# Patient Record
Sex: Female | Born: 1945
Health system: Southern US, Community
[De-identification: ages and names within clinical notes are randomized; demographics above are authoritative.]

## PROBLEM LIST (undated history)

## (undated) ENCOUNTER — Emergency Department (HOSPITAL_BASED_OUTPATIENT_CLINIC_OR_DEPARTMENT_OTHER): Admission: EM | Payer: Medicare Other

## (undated) ENCOUNTER — Emergency Department (HOSPITAL_COMMUNITY)
Disposition: A | Discharge status: Home / Self Care | Payer: Self-pay | Attending: Family Medicine | Admitting: Family Medicine

## (undated) DIAGNOSIS — Z87442 Personal history of urinary calculi: Secondary | ICD-10-CM

## (undated) DIAGNOSIS — E669 Obesity, unspecified: Secondary | ICD-10-CM

## (undated) DIAGNOSIS — D649 Anemia, unspecified: Secondary | ICD-10-CM

## (undated) DIAGNOSIS — L719 Rosacea, unspecified: Secondary | ICD-10-CM

## (undated) DIAGNOSIS — J439 Emphysema, unspecified: Secondary | ICD-10-CM

## (undated) DIAGNOSIS — K219 Gastro-esophageal reflux disease without esophagitis: Secondary | ICD-10-CM

## (undated) DIAGNOSIS — E785 Hyperlipidemia, unspecified: Secondary | ICD-10-CM

## (undated) DIAGNOSIS — F32A Depression, unspecified: Secondary | ICD-10-CM

## (undated) DIAGNOSIS — I509 Heart failure, unspecified: Secondary | ICD-10-CM

## (undated) DIAGNOSIS — E119 Type 2 diabetes mellitus without complications: Secondary | ICD-10-CM

## (undated) DIAGNOSIS — J45909 Unspecified asthma, uncomplicated: Secondary | ICD-10-CM

## (undated) DIAGNOSIS — E039 Hypothyroidism, unspecified: Secondary | ICD-10-CM

## (undated) DIAGNOSIS — R7303 Prediabetes: Secondary | ICD-10-CM

## (undated) DIAGNOSIS — G4730 Sleep apnea, unspecified: Secondary | ICD-10-CM

## (undated) DIAGNOSIS — I10 Essential (primary) hypertension: Secondary | ICD-10-CM

## (undated) DIAGNOSIS — F329 Major depressive disorder, single episode, unspecified: Secondary | ICD-10-CM

## (undated) DIAGNOSIS — K649 Unspecified hemorrhoids: Secondary | ICD-10-CM

## (undated) DIAGNOSIS — J8410 Pulmonary fibrosis, unspecified: Secondary | ICD-10-CM

## (undated) HISTORY — DX: Major depressive disorder, single episode, unspecified: F32.9

## (undated) HISTORY — DX: Depression, unspecified: F32.A

## (undated) HISTORY — DX: Obesity, unspecified: E66.9

## (undated) HISTORY — DX: Emphysema, unspecified: J43.9

## (undated) HISTORY — PX: ABDOMINAL HYSTERECTOMY: SHX81

## (undated) HISTORY — DX: Hyperlipidemia, unspecified: E78.5

## (undated) HISTORY — DX: Unspecified hemorrhoids: K64.9

## (undated) HISTORY — DX: Rosacea, unspecified: L71.9

## (undated) HISTORY — PX: NASAL SINUS SURGERY: SHX719

## (undated) HISTORY — PX: COLONOSCOPY: SHX174

## (undated) HISTORY — DX: Hypothyroidism, unspecified: E03.9

## (undated) HISTORY — PX: EYE SURGERY: SHX253

## (undated) HISTORY — PX: CYSTOSCOPY: SUR368

## (undated) HISTORY — PX: VAGINECTOMY, PARTIAL: SHX6846

---

## 1999-12-05 ENCOUNTER — Encounter: Payer: Self-pay | Admitting: Emergency Medicine

## 1999-12-05 ENCOUNTER — Emergency Department (HOSPITAL_COMMUNITY): Admission: EM | Admit: 1999-12-05 | Discharge: 1999-12-05 | Payer: Self-pay | Admitting: Emergency Medicine

## 2000-09-15 ENCOUNTER — Encounter: Payer: Self-pay | Admitting: Internal Medicine

## 2000-09-15 ENCOUNTER — Encounter: Admission: RE | Admit: 2000-09-15 | Discharge: 2000-09-15 | Payer: Self-pay | Admitting: Internal Medicine

## 2001-07-24 ENCOUNTER — Encounter: Payer: Self-pay | Admitting: Internal Medicine

## 2001-11-30 ENCOUNTER — Encounter: Admission: RE | Admit: 2001-11-30 | Discharge: 2001-11-30 | Payer: Self-pay | Admitting: Internal Medicine

## 2001-12-27 ENCOUNTER — Encounter: Payer: Self-pay | Admitting: Internal Medicine

## 2004-10-13 ENCOUNTER — Ambulatory Visit: Payer: Self-pay | Admitting: Internal Medicine

## 2004-10-29 ENCOUNTER — Ambulatory Visit: Payer: Self-pay | Admitting: Internal Medicine

## 2004-11-12 ENCOUNTER — Ambulatory Visit: Payer: Self-pay | Admitting: Internal Medicine

## 2006-05-08 ENCOUNTER — Ambulatory Visit: Payer: Self-pay | Admitting: Internal Medicine

## 2006-05-09 ENCOUNTER — Encounter: Payer: Self-pay | Admitting: Internal Medicine

## 2006-05-29 ENCOUNTER — Ambulatory Visit: Payer: Self-pay | Admitting: Internal Medicine

## 2006-05-29 LAB — CONVERTED CEMR LAB
BUN: 10 mg/dL (ref 6–23)
Basophils Absolute: 0.1 10*3/uL (ref 0.0–0.1)
Basophils Relative: 0.7 % (ref 0.0–1.0)
CO2: 29 meq/L (ref 19–32)
Calcium: 9.3 mg/dL (ref 8.4–10.5)
Chloride: 101 meq/L (ref 96–112)
Cholesterol: 211 mg/dL (ref 0–200)
Creatinine, Ser: 0.7 mg/dL (ref 0.4–1.2)
Direct LDL: 146.7 mg/dL
Eosinophils Absolute: 0.3 10*3/uL (ref 0.0–0.6)
Eosinophils Relative: 3.2 % (ref 0.0–5.0)
GFR calc Af Amer: 109 mL/min
GFR calc non Af Amer: 90 mL/min
Glucose, Bld: 117 mg/dL — ABNORMAL HIGH (ref 70–99)
HCT: 42.3 % (ref 36.0–46.0)
HDL: 53.2 mg/dL (ref >39.0)
Hemoglobin: 14.4 g/dL (ref 12.0–15.0)
Hgb A1c MFr Bld: 6 % (ref 4.6–6.0)
Lymphocytes Relative: 34.9 % (ref 12.0–46.0)
MCHC: 34 g/dL (ref 30.0–36.0)
MCV: 90.9 fL (ref 78.0–100.0)
Monocytes Absolute: 0.5 10*3/uL (ref 0.2–0.7)
Monocytes Relative: 6.2 % (ref 3.0–11.0)
Neutro Abs: 4.7 10*3/uL (ref 1.4–7.7)
Neutrophils Relative %: 55 % (ref 43.0–77.0)
Platelets: 310 10*3/uL (ref 150–400)
Potassium: 3.4 meq/L — ABNORMAL LOW (ref 3.5–5.1)
RBC: 4.65 M/uL (ref 3.87–5.11)
RDW: 11.4 % — ABNORMAL LOW (ref 11.5–14.6)
Sodium: 138 meq/L (ref 135–145)
TSH: 0.37 microintl units/mL (ref 0.35–5.50)
Total CHOL/HDL Ratio: 4
Triglycerides: 111 mg/dL (ref 0–149)
VLDL: 22 mg/dL (ref 0–40)
WBC: 8.6 10*3/uL (ref 4.5–10.5)

## 2006-06-29 ENCOUNTER — Encounter: Admission: RE | Admit: 2006-06-29 | Discharge: 2006-06-29 | Payer: Self-pay | Admitting: Otolaryngology

## 2006-08-08 ENCOUNTER — Ambulatory Visit (HOSPITAL_BASED_OUTPATIENT_CLINIC_OR_DEPARTMENT_OTHER): Admission: RE | Admit: 2006-08-08 | Discharge: 2006-08-08 | Payer: Self-pay | Admitting: Otolaryngology

## 2006-11-06 ENCOUNTER — Encounter: Payer: Self-pay | Admitting: Internal Medicine

## 2006-12-12 ENCOUNTER — Encounter: Payer: Self-pay | Admitting: Internal Medicine

## 2007-04-11 ENCOUNTER — Ambulatory Visit: Payer: Self-pay | Admitting: Internal Medicine

## 2007-04-11 DIAGNOSIS — R32 Unspecified urinary incontinence: Secondary | ICD-10-CM | POA: Insufficient documentation

## 2007-04-11 DIAGNOSIS — L719 Rosacea, unspecified: Secondary | ICD-10-CM | POA: Insufficient documentation

## 2007-04-11 DIAGNOSIS — E1159 Type 2 diabetes mellitus with other circulatory complications: Secondary | ICD-10-CM | POA: Insufficient documentation

## 2007-04-11 DIAGNOSIS — I10 Essential (primary) hypertension: Secondary | ICD-10-CM | POA: Insufficient documentation

## 2007-04-11 DIAGNOSIS — E782 Mixed hyperlipidemia: Secondary | ICD-10-CM | POA: Insufficient documentation

## 2007-04-11 DIAGNOSIS — R5383 Other fatigue: Secondary | ICD-10-CM

## 2007-04-11 DIAGNOSIS — Z87442 Personal history of urinary calculi: Secondary | ICD-10-CM | POA: Insufficient documentation

## 2007-04-11 DIAGNOSIS — R5381 Other malaise: Secondary | ICD-10-CM | POA: Insufficient documentation

## 2007-04-11 DIAGNOSIS — E039 Hypothyroidism, unspecified: Secondary | ICD-10-CM | POA: Insufficient documentation

## 2007-04-11 LAB — CONVERTED CEMR LAB: Vit D, 1,25-Dihydroxy: 27 — ABNORMAL LOW (ref 30–89)

## 2007-04-16 LAB — CONVERTED CEMR LAB
BUN: 11 mg/dL (ref 6–23)
Basophils Absolute: 0 10*3/uL (ref 0.0–0.1)
Basophils Relative: 0.4 % (ref 0.0–1.0)
CO2: 31 meq/L (ref 19–32)
Calcium: 9.6 mg/dL (ref 8.4–10.5)
Chloride: 103 meq/L (ref 96–112)
Creatinine, Ser: 0.7 mg/dL (ref 0.4–1.2)
Eosinophils Absolute: 0.2 10*3/uL (ref 0.0–0.6)
Eosinophils Relative: 2.7 % (ref 0.0–5.0)
GFR calc Af Amer: 109 mL/min
GFR calc non Af Amer: 90 mL/min
Glucose, Bld: 92 mg/dL (ref 70–99)
HCT: 43.2 % (ref 36.0–46.0)
Hemoglobin: 14.8 g/dL (ref 12.0–15.0)
Lymphocytes Relative: 32.8 % (ref 12.0–46.0)
MCHC: 34.4 g/dL (ref 30.0–36.0)
MCV: 91.4 fL (ref 78.0–100.0)
Monocytes Absolute: 0.7 10*3/uL (ref 0.2–0.7)
Monocytes Relative: 7.7 % (ref 3.0–11.0)
Neutro Abs: 5.1 10*3/uL (ref 1.4–7.7)
Neutrophils Relative %: 56.4 % (ref 43.0–77.0)
Platelets: 295 10*3/uL (ref 150–400)
Potassium: 3.7 meq/L (ref 3.5–5.1)
RBC: 4.72 M/uL (ref 3.87–5.11)
RDW: 11.6 % (ref 11.5–14.6)
Sodium: 142 meq/L (ref 135–145)
TSH: 8.51 microintl units/mL — ABNORMAL HIGH (ref 0.35–5.50)
WBC: 9 10*3/uL (ref 4.5–10.5)

## 2007-04-20 ENCOUNTER — Telehealth: Payer: Self-pay | Admitting: Internal Medicine

## 2007-06-06 ENCOUNTER — Ambulatory Visit: Payer: Self-pay | Admitting: Internal Medicine

## 2007-06-06 LAB — CONVERTED CEMR LAB: TSH: 1.21 microintl units/mL (ref 0.35–5.50)

## 2007-08-08 ENCOUNTER — Encounter: Payer: Self-pay | Admitting: Internal Medicine

## 2007-08-08 LAB — CONVERTED CEMR LAB: Vit D, 1,25-Dihydroxy: 25 — ABNORMAL LOW (ref 30–89)

## 2007-08-17 ENCOUNTER — Telehealth: Payer: Self-pay | Admitting: *Deleted

## 2007-08-17 ENCOUNTER — Encounter: Payer: Self-pay | Admitting: Internal Medicine

## 2007-09-06 ENCOUNTER — Encounter: Payer: Self-pay | Admitting: Internal Medicine

## 2007-09-25 ENCOUNTER — Ambulatory Visit: Payer: Self-pay | Admitting: Internal Medicine

## 2007-09-25 DIAGNOSIS — J069 Acute upper respiratory infection, unspecified: Secondary | ICD-10-CM | POA: Insufficient documentation

## 2007-09-28 ENCOUNTER — Telehealth: Payer: Self-pay | Admitting: *Deleted

## 2007-10-01 ENCOUNTER — Telehealth: Payer: Self-pay | Admitting: Internal Medicine

## 2007-10-02 ENCOUNTER — Encounter: Admission: RE | Admit: 2007-10-02 | Discharge: 2007-10-02 | Payer: Self-pay | Admitting: Family Medicine

## 2007-10-19 ENCOUNTER — Telehealth: Payer: Self-pay | Admitting: *Deleted

## 2007-12-28 ENCOUNTER — Encounter: Payer: Self-pay | Admitting: Internal Medicine

## 2008-02-21 ENCOUNTER — Telehealth: Payer: Self-pay | Admitting: *Deleted

## 2008-08-06 ENCOUNTER — Encounter: Admission: RE | Admit: 2008-08-06 | Discharge: 2008-08-06 | Payer: Self-pay | Admitting: Family Medicine

## 2009-12-18 DIAGNOSIS — J309 Allergic rhinitis, unspecified: Secondary | ICD-10-CM | POA: Insufficient documentation

## 2010-03-26 ENCOUNTER — Encounter
Admission: RE | Admit: 2010-03-26 | Discharge: 2010-03-26 | Payer: Self-pay | Source: Home / Self Care | Attending: Family Medicine | Admitting: Family Medicine

## 2010-04-03 ENCOUNTER — Encounter: Payer: Self-pay | Admitting: Family Medicine

## 2010-04-13 NOTE — Letter (Signed)
Summary: Generic Letter  [facility]   [place], [place] [address]   Phone: [phone]  Fax: [phone]    08/08/2007  [place], [place]  [address]  Dear Ms. Depaola,  We have tried to call you about your lab results. Your thyroid is normal but your Vitamin D is still low. Dr. Regis Bill wants to know if you are taking weekly Vitamin D? Please call our office at [phone] and let us know what your doing.         Sincerely,   Larene Beach, CMA [facility]

## 2010-04-13 NOTE — Assessment & Plan Note (Signed)
Summary: fu on meds/njr   Vital Signs:  Patient Profile:   65 Years Old Female Weight:      208 pounds Pulse rate:   84 / minute BP sitting:   156 / 90  (right arm) Cuff size:   regular  Vitals Entered By: Sherron Monday, CMA (April 11, 2007 10:53 AM)                 Chief Complaint:  Follow up.  History of Present Illness: Susan Hunt is here for a follow up on medications. Pt needs refills on all medications. Pt wants the generic effexor. She also wants to get mephloquin for traveling out of the country. She also needs to discuss going on oxybutynin.  Last ov was 2.08 for an acute visit and she was unable to follow up because of insurance and financial issues. But she has been doing fairly well. Going to [employer] a mission trip  in April for 3 weeks.  Wants the mefloquin for prophylaxis if not CI   HT : Has been in the high range 150-160    NO CP or sob. Tried 7.5 norvasc and can get down to 120 range. without symptom prefers least meds and generic.  Thyroid  : on generic.  needs labs done  has fatigue and dry skin fro a while takes med in pm on emmpty stomach.  MOOD :  Effexor xr  really only taking 1-2 and mood seems ok and stable  Lpiids : lipid panel done at hospital screening    TC 198 ? rest was good  BG  ? low   no problems GU  :  IFrequency Used Detrol in the past.  with help.  wants to try generic  oxybutinin  as it may have helped in the past.   Sleep ok no OSA or RLS  Hypertension History:      She denies headache, chest pain, palpitations, dyspnea with exertion, orthopnea, PND, peripheral edema, visual symptoms, neurologic problems, syncope, and side effects from treatment.  She notes no problems with any antihypertensive medication side effects.        Positive major cardiovascular risk factors include female age 58 years old or older, hyperlipidemia, and hypertension.  Negative major cardiovascular risk factors include no history of diabetes and  non-tobacco-user status.        Further assessment for target organ damage reveals no history of ASHD, cardiac end-organ damage (CHF/LVH), stroke/TIA, or peripheral vascular disease.     Current Allergies (reviewed today): ! CODEINE ! BIAXIN ! MICARDIS (TELMISARTAN)  Past Medical History:    Hyperlipidemia    Hypertension    Hypothyroidism        echo 7 03 mild pulmonary HT see pulmonary consult  stable   Family History:    CAD  deceased 64 Mom     Family History of Alcoholism/Addiction father.          Social History:    Reviewed history and no changes required:       Married       Former Smoker       Rare etoh,       No caffiene.        No exercise.    Risk Factors:  Tobacco use:  quit    Year quit:  8 years ago   Review of Systems  The patient denies anorexia, fever, weight loss, chest pain, syncope, dyspnea on exhertion, peripheral edema, abdominal pain, melena, and  hematochezia.         derm  dry skin and itchy  see hpi . no periods or hot flushes   Physical Exam  General:     alert, well-developed, well-nourished, and well-hydrated.   Head:     normocephalic and atraumatic.   Eyes:     vision grossly intact and pupils equal.   Ears:     R ear normal and L ear normal.   Nose:     no external deformity and no nasal discharge.   Mouth:     pharynx pink and moist and no erythema.   Neck:     No deformities, masses, or tenderness noted.no thyromegaly.   Lungs:     Normal respiratory effort, chest expands symmetrically. Lungs are clear to auscultation, no crackles or wheezes. Heart:     Normal rate and regular rhythm. S1 and S2 normal without gallop, murmur, click, rub or other extra sounds. Abdomen:     Bowel sounds positive,abdomen soft and non-tender without masses, organomegaly or hernias noted. Pulses:     intact without bruits Extremities:     no CCE Neurologic:     grossly intact without deficit Skin:     turgor normal.  mild facial  erythema. Dry skin with some excoraition at bra line area Cervical Nodes:     no anterior cervical adenopathy and no posterior cervical adenopathy.   Psych:     normally interactive, good eye contact, not anxious appearing, and not depressed appearing.      Impression & Recommendations:  Problem # 1:  HYPERTENSION (ICD-401.9) needs better control and need to inc med   pt thinks increasig norvasc will do this  can inc to 61m/day if needed Her updated medication list for this problem includes:    Hydrochlorothiazide 25 Mg Tabs (Hydrochlorothiazide) ..Marland Kitchen.. 1 by mouth once daily    Norvasc 5 Mg Tabs (Amlodipine besylate) ..Marland Kitchen.. 1.5 by mouth once daily  Orders: Venipuncture ((16109 TLB-BMP (Basic Metabolic Panel-BMET) (860454-[id number]  BP today: 156/90  Labs Reviewed: Creat: 0.7 (05/29/2006) Chol: 211 (05/29/2006)   HDL: 53.2 (05/29/2006)   LDL: DEL (05/29/2006)   TG: 111 (05/29/2006)   Problem # 2:  FATIGUE (ICD-780.79) r/o anemia and thyroid cause Orders: Venipuncture ((47829 TLB-CBC Platelet - w/Differential (85025-CBCD)   Problem # 3:  URINARY INCONTINENCE (ICD-788.30) ok to try generic  ditropan   Problem # 4:  HYPOTHYROIDISM (ICD-244.9)  Her updated medication list for this problem includes:    Synthroid 125 Mcg Tabs (Levothyroxine sodium) ..Marland Kitchen.. 1 by mouth once daily  Orders: Venipuncture ((56213 TLB-TSH (Thyroid Stimulating Hormone) (84443-TSH)  Labs Reviewed: TSH: 0.37 (05/29/2006)    HgBA1c: 6.0 (05/29/2006) Chol: 211 (05/29/2006)   HDL: 53.2 (05/29/2006)   LDL: DEL (05/29/2006)   TG: 111 (05/29/2006)   Problem # 5:  Preventive Health Care (ICD-V70.0) travel disc and rx for malaria prophylaxis with caution   to get yellow fever immuniz at hd  Problem # 6:  DYSTHYMIA, CHRONIC (ICD-300.4) stable  rec against IR effexor because of risk of side effect .  but could try samples of cymbalta 60 28 given and can call for rx .   cost may be less  Problem # 7:   HYPERLIPIDEMIA (ICD-272.4) reported better by patient   she will get uKoreacopy or call in numbers of lipid screen. Labs Reviewed: Chol: 211 (05/29/2006)   HDL: 53.2 (05/29/2006)   LDL: DEL (05/29/2006)   TG: 111 (05/29/2006)   Complete Medication  List: 1)  Synthroid 125 Mcg Tabs (Levothyroxine sodium) .Marland Kitchen.. 1 by mouth once daily 2)  Baby Aspirin 81 Mg Chew (Aspirin) 3)  Hydrochlorothiazide 25 Mg Tabs (Hydrochlorothiazide) .Marland Kitchen.. 1 by mouth once daily 4)  Norvasc 5 Mg Tabs (Amlodipine besylate) .... 1.5 by mouth once daily 5)  Oxybutynin Chloride 5 Mg Tabs (Oxybutynin chloride) .Marland Kitchen.. 1 by mouth three times a day prn 6)  Lariam 250 Mg Tabs (Mefloquine hcl) .Marland Kitchen.. 1 by mouth q week beginning 1 week pre travel and continue 2 weeks after return 7)  Cymbalta 60 Mg Cpep (Duloxetine hcl) .Marland Kitchen.. 1 by mouth once daily  Other Orders: T-Vitamin D (25-Hydroxy) [phone])  Hypertension Assessment/Plan:      The patient's hypertensive risk group is category B: At least one risk factor (excluding diabetes) with no target organ damage.  Today's blood pressure is 156/90.  Her blood pressure goal is < 140/90.   Patient Instructions: 1)  check BP readings and call March 1 st with readings on new dose of Norvasc.  2)  We will call you about lab results.    Prescriptions: HYDROCHLOROTHIAZIDE 25 MG  TABS (HYDROCHLOROTHIAZIDE) 1 by mouth once daily  #90 x 3   Entered and Authorized by:   Burnis Medin MD   Signed by:   Burnis Medin MD on 04/11/2007   Method used:   Electronically sent to ...       [facility]       [address]       [place], [place]  [address]       Ph: [phone]       Fax: [phone]   RxID:   [id number] SYNTHROID 125 MCG  TABS (LEVOTHYROXINE SODIUM) 1 by mouth once daily  #30 x 12   Entered and Authorized by:   Burnis Medin MD   Signed by:   Burnis Medin MD on 04/11/2007   Method used:   Electronically sent to ...        [facility]       [address]       [place], [place]  [address]       Ph: [phone]       Fax: [phone]   RxID:   [id number] NORVASC 5 MG  TABS (AMLODIPINE BESYLATE) 1.5 by mouth once daily  #45 x 15   Entered and Authorized by:   Burnis Medin MD   Signed by:   Burnis Medin MD on 04/11/2007   Method used:   Electronically sent to ...       [facility]       [address]       [place], [place]  [address]       Ph: [phone]       Fax: [phone]   RxID:   [id number] LARIAM 250 MG  TABS (MEFLOQUINE HCL) 1 by mouth q week beginning 1 week pre travel and continue 2 weeks after return  #6 x 0   Entered and Authorized by:   Burnis Medin MD   Signed by:   Burnis Medin MD on 04/11/2007   Method used:   Electronically sent to ...       [facility].*  [address]       [place], [place]  [address]       Ph: [phone]       Fax: [phone]   RxID:   [id number] OXYBUTYNIN CHLORIDE 5 MG  TABS (OXYBUTYNIN CHLORIDE) 1 by mouth three times a day prn  #90 x 6   Entered and Authorized by:   Burnis Medin MD   Signed by:   Burnis Medin MD on 04/11/2007   Method used:   Electronically sent to ...       [facility]       [address]       [place], [place]  [address]       Ph: [phone]       Fax: [phone]   RxID:   [id number]  ]

## 2010-04-13 NOTE — Medication Information (Signed)
Summary: cigna letter re: medication  cigna letter re: medication   Imported By: Jamelle Haring 11/14/2006 14:20:35  _____________________________________________________________________  External Attachment:    Type:   Image     Comment:   cigna note

## 2010-04-13 NOTE — Assessment & Plan Note (Signed)
Summary: ?flu symptoms/njr   Vital Signs:  Patient Profile:   65 Years Old Female O2 Sat:      96 % O2 treatment:    Room Air Temp:     98.9 degrees F oral Pulse rate:   74 / minute Pulse rhythm:   regular BP sitting:   120 / 90  (right arm) Cuff size:   regular  Vitals Entered By: Sherron Monday, CMA (September 25, 2007 8:52 AM)                 Chief Complaint:  Cold & URI symptoms.  History of Present Illness: Susan Hunt is here for a work in  flu like .sx  with onset of back pain, cough, temp 103.0 last night, lethargic , and some cough. Pt stated that cough started 09/23/07. and then got    Ha   .  Some nausea helped by  meds.     Taking tylenol and phenergan   No one sick  at home.    Works at [employer].  No recent travel.  No CP or SOB or wheezing or CP.    No rashes tick bites.    Prior Medications Reviewed Using: Patient Recall  Updated Prior Medication List: SYNTHROID 150 MCG  TABS (LEVOTHYROXINE SODIUM) 1 by mouth once daily BABY ASPIRIN 81 MG  CHEW (ASPIRIN)  HYDROCHLOROTHIAZIDE 25 MG  TABS (HYDROCHLOROTHIAZIDE) 1 by mouth once daily NORVASC 5 MG  TABS (AMLODIPINE BESYLATE) 1.5 by mouth once daily DETROL LA 4 MG  CP24 (TOLTERODINE TARTRATE) 1 by mouth once daily CYMBALTA 60 MG  CPEP (DULOXETINE HCL) 1 by mouth once daily VITAMIN D 61950 UNIT  CAPS (ERGOCALCIFEROL) 1 by mouth twice a week  Current Allergies (reviewed today): ! CODEINE ! BIAXIN ! 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     Physical Exam  General:  Well-developed,well-nourished,in no acute distress; alert,appropriate and cooperative throughout examination mildly ill   Head:     Normocephalic and atraumatic without obvious abnormalities. No apparent alopecia or balding. Eyes:     nho redness or discharge  Ears:     R ear normal, L ear normal, and no external deformities.   Nose:     no external deformity and no external erythema. minimal stuffiness  Mouth:     mild erythema no lesions Neck:     No deformities, masses, or tenderness noted.shoddy nodes Lungs:     Normal respiratory effort, chest expands symmetrically. Lungs are clear to auscultation, no crackles or wheezes.  cough is deep and bronchial sounding Heart:     Normal rate and regular rhythm. S1 and S2 normal without gallop, murmur, click, rub or other extra sounds. Extremities:     no cce  Skin:     turgor normal and color normal.  nl cap refill Cervical Nodes:     shoddy  Psych:     Oriented X3, normally interactive, and good eye contact.  Impression & Recommendations:  Problem # 1:  URI (ICD-465.9) flu like illness   disc options and  tamiflu  and risk benefit     at present will do symptom rx and  stay home as rec by health directives Her updated medication list for this problem includes:    Baby Aspirin 81 Mg Chew (Aspirin)    Promethazine Hcl 25 Mg Tabs (Promethazine hcl) .Marland Kitchen... 1 by mouth q 4-6 hours as needed   Problem # 2:  FEVER (ICD-780.60) see above  Complete Medication List: 1)  Synthroid 150 Mcg Tabs (Levothyroxine sodium) .Marland Kitchen.. 1 by mouth once daily 2)  Baby Aspirin 81 Mg Chew (Aspirin) 3)  Hydrochlorothiazide 25 Mg Tabs (Hydrochlorothiazide) .Marland Kitchen.. 1 by mouth once daily 4)  Norvasc 5 Mg Tabs (Amlodipine besylate) .... 1.5 by mouth once daily 5)  Detrol La 4 Mg Cp24 (Tolterodine tartrate) .Marland Kitchen.. 1 by mouth once daily 6)  Cymbalta 60 Mg Cpep (Duloxetine hcl) .Marland Kitchen.. 1 by mouth once daily 7)  Vitamin D 50000 Unit Caps (Ergocalciferol) .Marland Kitchen.. 1 by  mouth twice a week 8)  Promethazine Hcl 25 Mg Tabs (Promethazine hcl) .Marland Kitchen.. 1 by mouth q 4-6 hours as needed   Patient Instructions: 1)  call if fever persists after another 48 hours or if SOB etc 2)  stay home for this week  (7 days recommended)     Prescriptions: PROMETHAZINE HCL 25 MG  TABS (PROMETHAZINE HCL) 1 by mouth q 4-6 hours as needed  #20 x 0   Entered and Authorized by:   Burnis Medin MD   Signed by:   Burnis Medin MD on 09/25/2007   Method used:   Electronically sent to ...       [facility]       [address]       [place], [place]  [address]       Ph: [phone]       Fax: [phone]   RxID:   [id number]  ]

## 2010-04-13 NOTE — Progress Notes (Signed)
Summary: refill  Phone Note From Pharmacy   Caller: [facility] Reason for Call: Needs renewal Details for Reason: Vitamin D 50,000 iu Initial call taken by: Sherron Monday, [place],  February 21, 2008 1:49 PM  Follow-up for Phone Call        Sent rx via electronically  Follow-up by: Sherron Monday, CMA,  February 21, 2008 1:50 PM      Prescriptions: VITAMIN D 33612 UNIT  CAPS (ERGOCALCIFEROL) 1 by mouth twice a week  #8 x 3   Entered by:   Sherron Monday, CMA   Authorized by:   Burnis Medin MD   Signed by:   Sherron Monday, CMA on 02/21/2008   Method used:   Electronically to        [facility].* (retail)       [address]       [place], [place]  [address]       Ph: [phone]       Fax: [phone]   RxID:   [id number]

## 2010-04-13 NOTE — Procedures (Signed)
Summary: Colonoscopy Report/[facility]  Colonoscopy Report/[facility]   Imported By: Laural Benes 01/03/2008 15:36:06  _____________________________________________________________________  External Attachment:    Type:   Image     Comment:   External Document

## 2010-04-13 NOTE — Progress Notes (Signed)
Summary: oxybutin not working  Phone Note Call from Patient Call back at [phone]   Caller: Patient Summary of Call: Oxybutin not working. She wants to go back on Detrol La called into [facility] on [address]. Initial call taken by: Sherron Monday, [place],  April 20, 2007 1:52 PM  Follow-up for Phone Call        Per md- ok to go back on Detrol La but make sure she has tried for 3 weeks. Spoke to pt she has tried it for 2 weeks. Rx sent thru EMR. Follow-up by: Sherron Monday, CMA,  April 20, 2007 4:34 PM    New/Updated Medications: DETROL LA 4 MG  CP24 (TOLTERODINE TARTRATE) 1 by mouth once daily   Prescriptions: DETROL LA 4 MG  CP24 (TOLTERODINE TARTRATE) 1 by mouth once daily  #30 x 6   Entered by:   Sherron Monday, CMA   Authorized by:   Burnis Medin MD   Signed by:   Sherron Monday, CMA on 04/20/2007   Method used:   Electronically sent to ...       [facility]       [address]       [place], [place]  [address]       Ph: [phone]       Fax: [phone]   RxID:   [id number]

## 2010-07-16 ENCOUNTER — Other Ambulatory Visit: Payer: Self-pay | Admitting: Family Medicine

## 2010-07-16 ENCOUNTER — Ambulatory Visit
Admission: RE | Admit: 2010-07-16 | Discharge: 2010-07-16 | Disposition: A | Discharge status: Home / Self Care | Payer: 59 | Source: Ambulatory Visit | Attending: Family Medicine | Admitting: Family Medicine

## 2010-07-16 DIAGNOSIS — R197 Diarrhea, unspecified: Secondary | ICD-10-CM

## 2010-07-16 MED ORDER — IOHEXOL 300 MG/ML  SOLN
100.0000 mL | Freq: Once | INTRAMUSCULAR | Status: AC | PRN
  Administered 2010-07-16: 100 mL via INTRAVENOUS

## 2010-07-30 NOTE — Op Note (Signed)
[name], SUCH            ACCOUNT NO.:  [id number]   MEDICAL RECORD NO.:  [id number]          PATIENT TYPE:  AMB   LOCATION:  [place]                          FACILITY:  [facility]   PHYSICIAN:  Christopher E. Lucia Gaskins, M.D.DATE OF BIRTH:  [birth year]   DATE OF PROCEDURE:  08/08/2006  DATE OF DISCHARGE:                               OPERATIVE REPORT   PREOPERATIVE DIAGNOSES:  1. Septal deviation to the right, with right-sided nasal obstruction.  2. Turbinate hypertrophy.   POSTOPERATIVE DIAGNOSES:  1. Septal deviation to the right, with right-sided nasal obstruction.  2. Turbinate hypertrophy.   OPERATION:  Septoplasty, with bilateral inferior turbinate reductions.   SURGEON:  Leonides Sake. Lucia Gaskins, M.D.   ANESTHESIA:  General endotracheal.   COMPLICATIONS:  None.   CLINICAL NOTE:  Susan Hunt is a 65 year old female who has had  chronic right-sided nasal obstruction for several years.  She has had  increasing snoring as well.  She is also had pressure in her sinuses,  especially on the right side.  CT scan sinuses showed clear paranasal  sinuses.  The patient has a significant septal deviation to the right,  with a right septal spur on.  She is taken to the operating room at this  time for septoplasty and turbinate reductions.   DESCRIPTION OF PROCEDURE:  After adequate endotracheal anesthesia, the  patient received 1 g Ancef intraoperatively.  The nose was prepped with  Betadine solution and draped out with sterile towels.  Septum and  turbinates were then injected with Xylocaine with epinephrine for local  anesthetic and hemostasis.  A hemitransfixion incision was made along  the caudal edge of the septum on the right side.  Mucoperichondrial and  mucoperiosteal flaps were elevated posteriorly.  The patient had  deviation of the bony septum to the right, with a large septal spur on  the right side posteriorly.  This was removed.  The anterior  cartilaginous septum was  relatively midline.  The hemitransfixion  incision was closed with interrupted 4-0 chromic sutures, and the septum  was basted with a 4-0 chromic suture.  Next, the inferior turbinate  ductions were performed on.  The incision was made along the inferior  edge of the turbinate, and submucosal elevation of the turbinate off of  the turbinate bone was performed.  The turbinate bone was then removed,  and soft tissues submucosally was cauterized with suction cautery.  The  turbinates were then outfractured.  This completed the turbinate  reductions.  At the completion of the case, the inferior turbinates  injected with 40 mg of Depo-Medrol bilaterally.  This completed the  procedure.  The nose was packed with Telfa soaked in bacitracin ointment  on both sides.  Susan Hunt was awakened from anesthesia and transferred to  the recovery room postoperatively doing well.   DISPOSITION:  Shirah was discharged home later this morning on Keflex 5  mg b.i.d. for 1 week, and Tylenol and Vicodin p.r.n. pain.  We will have  her follow up in my office tomorrow to have the nasal packs removed.  ______________________________  Leonides Sake Lucia Gaskins, M.D.     CEN/MEDQ  D:  08/08/2006  T:  08/08/2006  Job:  [id number]

## 2011-01-31 ENCOUNTER — Other Ambulatory Visit: Payer: Self-pay | Admitting: Nurse Practitioner

## 2011-01-31 DIAGNOSIS — Z780 Asymptomatic menopausal state: Secondary | ICD-10-CM

## 2011-01-31 DIAGNOSIS — Z1231 Encounter for screening mammogram for malignant neoplasm of breast: Secondary | ICD-10-CM

## 2011-03-15 HISTORY — PX: FRACTURE SURGERY: SHX138

## 2011-03-29 ENCOUNTER — Ambulatory Visit
Admission: RE | Admit: 2011-03-29 | Discharge: 2011-03-29 | Disposition: A | Discharge status: Home / Self Care | Payer: 59 | Source: Ambulatory Visit | Attending: Nurse Practitioner | Admitting: Nurse Practitioner

## 2011-03-29 DIAGNOSIS — Z780 Asymptomatic menopausal state: Secondary | ICD-10-CM

## 2011-03-29 DIAGNOSIS — Z1231 Encounter for screening mammogram for malignant neoplasm of breast: Secondary | ICD-10-CM

## 2011-06-21 ENCOUNTER — Encounter: Payer: Self-pay | Admitting: Internal Medicine

## 2011-07-18 ENCOUNTER — Ambulatory Visit: Payer: 59 | Admitting: Internal Medicine

## 2011-09-16 ENCOUNTER — Encounter (HOSPITAL_COMMUNITY): Payer: Self-pay | Admitting: *Deleted

## 2011-09-16 ENCOUNTER — Emergency Department (HOSPITAL_COMMUNITY)
Admission: EM | Admit: 2011-09-16 | Discharge: 2011-09-16 | Disposition: A | Discharge status: Home / Self Care | Payer: Self-pay | Source: Home / Self Care | Attending: Emergency Medicine | Admitting: Emergency Medicine

## 2011-09-16 ENCOUNTER — Emergency Department (INDEPENDENT_AMBULATORY_CARE_PROVIDER_SITE_OTHER): Payer: Medicare Other

## 2011-09-16 DIAGNOSIS — S82209A Unspecified fracture of shaft of unspecified tibia, initial encounter for closed fracture: Secondary | ICD-10-CM

## 2011-09-16 DIAGNOSIS — S82409A Unspecified fracture of shaft of unspecified fibula, initial encounter for closed fracture: Secondary | ICD-10-CM

## 2011-09-16 HISTORY — DX: Essential (primary) hypertension: I10

## 2011-09-16 HISTORY — DX: Unspecified asthma, uncomplicated: J45.909

## 2011-09-16 MED ORDER — ONDANSETRON HCL 4 MG PO TABS: 4.0000 mg | ORAL_TABLET | Freq: Three times a day (TID) | ORAL | Status: AC | PRN

## 2011-09-16 MED ORDER — HYDROCODONE-ACETAMINOPHEN 5-500 MG PO TABS: 1.0000 | ORAL_TABLET | Freq: Four times a day (QID) | ORAL | Status: AC | PRN

## 2011-09-16 MED ORDER — IBUPROFEN 800 MG PO TABS
ORAL_TABLET | ORAL | Status: AC
  Filled 2011-09-16: qty 1

## 2011-09-16 MED ORDER — IBUPROFEN 800 MG PO TABS
800.0000 mg | ORAL_TABLET | Freq: Once | ORAL | Status: AC
  Administered 2011-09-16: 800 mg via ORAL

## 2011-09-16 NOTE — Progress Notes (Signed)
Orthopedic Tech Progress Note Patient Details:  Susan Hunt Aug 01, 1945 [id number]  Ortho Devices Type of Ortho Device: Stirrup splint;Post (short) splint Splint Material: Fiberglass Ortho Device/Splint Location: (L) LE Ortho Device/Splint Interventions: Application   Braulio Bosch 09/16/2011, 7:35 PM
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
## 2013-07-15 DIAGNOSIS — E669 Obesity, unspecified: Secondary | ICD-10-CM | POA: Insufficient documentation

## 2014-01-07 ENCOUNTER — Other Ambulatory Visit: Payer: Self-pay | Admitting: Family Medicine

## 2014-01-07 DIAGNOSIS — Z1231 Encounter for screening mammogram for malignant neoplasm of breast: Secondary | ICD-10-CM

## 2014-01-28 DIAGNOSIS — F339 Major depressive disorder, recurrent, unspecified: Secondary | ICD-10-CM | POA: Insufficient documentation

## 2014-01-28 DIAGNOSIS — F329 Major depressive disorder, single episode, unspecified: Secondary | ICD-10-CM | POA: Insufficient documentation

## 2014-01-29 ENCOUNTER — Ambulatory Visit
Admission: RE | Admit: 2014-01-29 | Discharge: 2014-01-29 | Disposition: A | Discharge status: Home / Self Care | Payer: Medicare Other | Source: Ambulatory Visit | Attending: Family Medicine | Admitting: Family Medicine

## 2014-01-29 DIAGNOSIS — Z1231 Encounter for screening mammogram for malignant neoplasm of breast: Secondary | ICD-10-CM

## 2014-03-14 HISTORY — PX: CATARACT EXTRACTION W/ INTRAOCULAR LENS IMPLANT: SHX1309

## 2014-03-14 HISTORY — PX: DENTAL SURGERY: SHX609

## 2014-05-06 DIAGNOSIS — R0683 Snoring: Secondary | ICD-10-CM | POA: Insufficient documentation

## 2014-05-06 DIAGNOSIS — G4733 Obstructive sleep apnea (adult) (pediatric): Secondary | ICD-10-CM | POA: Insufficient documentation

## 2014-05-07 ENCOUNTER — Other Ambulatory Visit (HOSPITAL_COMMUNITY): Payer: Self-pay | Admitting: Family Medicine

## 2014-05-07 DIAGNOSIS — R9431 Abnormal electrocardiogram [ECG] [EKG]: Secondary | ICD-10-CM

## 2014-05-07 DIAGNOSIS — R5383 Other fatigue: Secondary | ICD-10-CM

## 2014-05-13 ENCOUNTER — Telehealth (HOSPITAL_COMMUNITY): Payer: Self-pay

## 2014-05-13 NOTE — Telephone Encounter (Signed)
Encounter complete. 

## 2014-05-14 ENCOUNTER — Telehealth (HOSPITAL_COMMUNITY): Payer: Self-pay

## 2014-05-14 NOTE — Telephone Encounter (Signed)
Encounter complete. 

## 2014-05-15 ENCOUNTER — Ambulatory Visit (HOSPITAL_COMMUNITY)
Admission: RE | Admit: 2014-05-15 | Discharge: 2014-05-15 | Disposition: A | Discharge status: Home / Self Care | Payer: Medicare Other | Source: Ambulatory Visit | Attending: Cardiology | Admitting: Cardiology

## 2014-05-15 DIAGNOSIS — R9431 Abnormal electrocardiogram [ECG] [EKG]: Secondary | ICD-10-CM | POA: Insufficient documentation

## 2014-05-15 DIAGNOSIS — R5383 Other fatigue: Secondary | ICD-10-CM | POA: Insufficient documentation

## 2014-05-15 MED ORDER — TECHNETIUM TC 99M SESTAMIBI GENERIC - CARDIOLITE
10.9000 | Freq: Once | INTRAVENOUS | Status: AC | PRN
  Administered 2014-05-15: 10.9 via INTRAVENOUS

## 2014-05-15 MED ORDER — AMINOPHYLLINE 25 MG/ML IV SOLN
75.0000 mg | Freq: Once | INTRAVENOUS | Status: AC
  Administered 2014-05-15: 75 mg via INTRAVENOUS

## 2014-05-15 MED ORDER — REGADENOSON 0.4 MG/5ML IV SOLN
0.4000 mg | Freq: Once | INTRAVENOUS | Status: AC
  Administered 2014-05-15: 0.4 mg via INTRAVENOUS

## 2014-05-15 MED ORDER — TECHNETIUM TC 99M SESTAMIBI GENERIC - CARDIOLITE
32.8000 | Freq: Once | INTRAVENOUS | Status: AC | PRN
  Administered 2014-05-15: 32.8 via INTRAVENOUS

## 2014-05-15 NOTE — Procedures (Addendum)
 [facility] [address] [place] [address] [phone]  Cardiology Nuclear Med Study  [facility] is a 69 y.o. female     MRN : [id number]     DOB: 10-Jun-1945  Procedure Date: 05/15/2014  Nuclear Med Background Indication for Stress Test:  Evaluation for Ischemia and Abnormal EKG History:  COPD and No prior cardiac history reported;No prior NUC MPI for comparison. Cardiac Risk Factors: Family History - CAD, History of Smoking, Hypertension, Lipids and Obesity  Symptoms:  Dizziness, DOE, Fatigue, Light-Headedness, Nausea and Vomiting   Nuclear Pre-Procedure Caffeine/Decaff Intake:  1:00am NPO After: 9:00am   IV Site: R Forearm  IV 0.9% NS with Angio Cath:  22g  Chest Size (in):  n/a IV Started by: Rolene Course, RN  Height: 5' 8" (1.727 m)  Cup Size: C  BMI:  Body mass index is 31.48 kg/(m^2). Weight:  207 lb (93.895 kg)   Tech Comments:  n/a    Nuclear Med Study 1 or 2 day study: 1 day  Stress Test Type:  [place] Provider:  Billey Chang, MD   Resting Radionuclide: Technetium 18mSestamibi  Resting Radionuclide Dose: 10.9 mCi   Stress Radionuclide:  Technetium 996mestamibi  Stress Radionuclide Dose: 32.8  mCi           Stress Protocol Rest HR: 59 Stress HR: 63  Rest BP: 142/76 Stress BP: 139/81  Exercise Time (min): n/a METS: n/a   Predicted Max HR: 151 bpm % Max HR: 50.33 bpm Rate Pressure Product: 10944  Dose of Adenosine (mg):  n/a Dose of Lexiscan: 0.4 mg  Dose of Atropine (mg): n/a Dose of Dobutamine: n/a mcg/kg/min (at max HR)  Stress Test Technologist: TeLeane ParaCCT Nuclear Technologist: PaImagene RichesCNMT   Rest Procedure:  Myocardial perfusion imaging was performed at rest 45 minutes following the intravenous administration of Technetium 9928mstamibi. Stress Procedure:  The patient received IV Lexiscan 0.4 mg over 15-seconds.  Technetium 5m36mtamibi injected IV  at 30-seconds.  Patient experienced SOB and Nausea and 75 mg Aminophylline IV was administered. There were no significant changes with Lexiscan.  Quantitative spect images were obtained after a 45 minute delay.  Transient Ischemic Dilatation (Normal <1.22):  0.97  QGS EDV:  101 ml QGS ESV:  47 ml LV Ejection Fraction: 54%    Rest ECG: NSR with non-specific ST-T wave changes  Stress ECG: No significant ST segment change suggestive of ischemia.  QPS Raw Data Images:  There is interference from nuclear activity from structures below the diaphragm. This does not affect the ability to read the study. Stress Images:  There is decreased uptake in the apex. Rest Images:  There is decreased uptake in the apex. Subtraction (SDS):  No evidence of ischemia.  Impression Exercise Capacity:  Lexiscan with no exercise. BP Response:  Normal blood pressure response. Clinical Symptoms:  There is dyspnea. ECG Impression:  No significant ST segment change suggestive of ischemia. Comparison with Prior Nuclear Study: No previous nuclear study performed  Overall Impression:  Normal stress nuclear study with a small, mild, fixed apical defect consistent with thinning; no ischemia.  LV Wall Motion:  NL LV Function; NL Wall Motion   [name]  05/15/2014 5:19 PM

## 2014-06-10 ENCOUNTER — Institutional Professional Consult (permissible substitution): Payer: Self-pay | Admitting: Cardiovascular Disease

## 2014-06-17 DIAGNOSIS — J431 Panlobular emphysema: Secondary | ICD-10-CM | POA: Insufficient documentation

## 2014-08-29 ENCOUNTER — Emergency Department (HOSPITAL_COMMUNITY)
Admission: EM | Admit: 2014-08-29 | Discharge: 2014-08-29 | Disposition: A | Discharge status: Home / Self Care | Payer: Medicare Other | Attending: Emergency Medicine | Admitting: Emergency Medicine

## 2014-08-29 ENCOUNTER — Emergency Department (HOSPITAL_COMMUNITY): Payer: Medicare Other

## 2014-08-29 ENCOUNTER — Encounter (HOSPITAL_COMMUNITY): Payer: Self-pay | Admitting: *Deleted

## 2014-08-29 DIAGNOSIS — J45909 Unspecified asthma, uncomplicated: Secondary | ICD-10-CM | POA: Diagnosis not present

## 2014-08-29 DIAGNOSIS — H538 Other visual disturbances: Secondary | ICD-10-CM | POA: Insufficient documentation

## 2014-08-29 DIAGNOSIS — Z79899 Other long term (current) drug therapy: Secondary | ICD-10-CM | POA: Insufficient documentation

## 2014-08-29 DIAGNOSIS — Z7951 Long term (current) use of inhaled steroids: Secondary | ICD-10-CM | POA: Diagnosis not present

## 2014-08-29 DIAGNOSIS — I10 Essential (primary) hypertension: Secondary | ICD-10-CM | POA: Insufficient documentation

## 2014-08-29 DIAGNOSIS — H547 Unspecified visual loss: Secondary | ICD-10-CM | POA: Diagnosis present

## 2014-08-29 DIAGNOSIS — H539 Unspecified visual disturbance: Secondary | ICD-10-CM

## 2014-08-29 LAB — COMPREHENSIVE METABOLIC PANEL
ALT: 19 U/L (ref 14–54)
AST: 23 U/L (ref 15–41)
Albumin: 4 g/dL (ref 3.5–5.0)
Alkaline Phosphatase: 72 U/L (ref 38–126)
Anion gap: 10 (ref 5–15)
BUN: 12 mg/dL (ref 6–20)
CO2: 27 mmol/L (ref 22–32)
Calcium: 9.6 mg/dL (ref 8.9–10.3)
Chloride: 103 mmol/L (ref 101–111)
Creatinine, Ser: 0.98 mg/dL (ref 0.44–1.00)
GFR calc Af Amer: >60 mL/min (ref >60)
GFR calc non Af Amer: 58 mL/min — ABNORMAL LOW (ref >60)
Glucose, Bld: 98 mg/dL (ref 65–99)
Potassium: 3.9 mmol/L (ref 3.5–5.1)
Sodium: 140 mmol/L (ref 135–145)
Total Bilirubin: 0.9 mg/dL (ref 0.3–1.2)
Total Protein: 7.4 g/dL (ref 6.5–8.1)

## 2014-08-29 LAB — CBC WITH DIFFERENTIAL/PLATELET
Basophils Absolute: 0.1 10*3/uL (ref 0.0–0.1)
Basophils Relative: 1 % (ref 0–1)
Eosinophils Absolute: 0.2 10*3/uL (ref 0.0–0.7)
Eosinophils Relative: 2 % (ref 0–5)
HCT: 44.3 % (ref 36.0–46.0)
Hemoglobin: 14.9 g/dL (ref 12.0–15.0)
Lymphocytes Relative: 27 % (ref 12–46)
Lymphs Abs: 3.3 10*3/uL (ref 0.7–4.0)
MCH: 31.4 pg (ref 26.0–34.0)
MCHC: 33.6 g/dL (ref 30.0–36.0)
MCV: 93.3 fL (ref 78.0–100.0)
Monocytes Absolute: 0.9 10*3/uL (ref 0.1–1.0)
Monocytes Relative: 7 % (ref 3–12)
Neutro Abs: 7.6 10*3/uL (ref 1.7–7.7)
Neutrophils Relative %: 63 % (ref 43–77)
Platelets: 277 10*3/uL (ref 150–400)
RBC: 4.75 MIL/uL (ref 3.87–5.11)
RDW: 12.4 % (ref 11.5–15.5)
WBC: 12.1 10*3/uL — ABNORMAL HIGH (ref 4.0–10.5)

## 2014-08-29 MED ORDER — GADOBENATE DIMEGLUMINE 529 MG/ML IV SOLN
20.0000 mL | Freq: Once | INTRAVENOUS | Status: AC | PRN
Start: 2014-08-29 — End: 2014-08-29
  Administered 2014-08-29: 20 mL via INTRAVENOUS

## 2014-08-29 NOTE — ED Notes (Signed)
The pt had cataract surgery on her lt eye one month ago  Since the surgery she has had vision loss and she sees cob-webs in her field of vision lt eye. 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

## 2014-08-29 NOTE — ED Notes (Signed)
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

## 2014-08-29 NOTE — ED Notes (Signed)
Patient transported to MRI 

## 2014-08-29 NOTE — ED Notes (Signed)
Pt discussed with dr Zenia Resides  c-t per his order

## 2014-08-29 NOTE — ED Provider Notes (Addendum)
CSN: [id number]     Arrival date & time 08/29/14  1704 History   First MD Initiated Contact with Patient 08/29/14 1905     Chief Complaint  Patient presents with  . Loss of Vision     (Consider location/radiation/quality/duration/timing/severity/associated sxs/prior Treatment) HPI Comments: Patient here after seeing her optometrist and noted to have a bilateral visual field cut. Patient states that she has had decreased vision since her cataract surgery approximately 2 months ago. Had laser surgery 5 days ago for similar symptoms. Since that time she is described spider webs in her vision but denies any actual change in the vision. Denies any neurological symptoms. No eye pain or eye drainage. Denies any new vision loss. Was sent here for further evaluation  The history is provided by the patient.    Past Medical History  Diagnosis Date  . Hypertension   . Asthma    Past Surgical History  Procedure Laterality Date  . Nasal sinus surgery     No family history on file. History  Substance Use Topics  . Smoking status: Never Smoker   . Smokeless tobacco: Not on file  . Alcohol Use: Yes   OB History    No data available     Review of Systems  All other systems reviewed and are negative.     Allergies  Clarithromycin; Codeine; and Telmisartan  Home Medications   Prior to Admission medications   Medication Sig Start Date End Date Taking? Authorizing Provider  amLODipine-olmesartan (AZOR) 10-40 MG per tablet Take 1 tablet by mouth daily.    Historical Provider, MD  fluticasone (FLONASE) 50 MCG/ACT nasal spray Place 2 sprays into the nose daily.    Historical Provider, MD  hydrochlorothiazide (HYDRODIURIL) 25 MG tablet Take 25 mg by mouth daily.    Historical Provider, MD  levothyroxine (SYNTHROID, LEVOTHROID) 150 MCG tablet Take 150 mcg by mouth daily.    Historical Provider, MD  loratadine (CLARITIN) 10 MG tablet Take 10 mg by mouth daily.    Historical Provider, MD   montelukast (SINGULAIR) 10 MG tablet Take 10 mg by mouth at bedtime.    Historical Provider, MD   BP 137/62 mmHg  Pulse 69  Temp(Src) 98.4 F (36.9 C) (Oral)  Resp 18  SpO2 100% Physical Exam  Constitutional: She is oriented to person, place, and time. She appears well-developed and well-nourished.  Non-toxic appearance. No distress.  HENT:  Head: Normocephalic and atraumatic.  Eyes: Conjunctivae, EOM and lids are normal. Pupils are equal, round, and reactive to light.  Bilateral upper visual field peripheral vision loss.  Neck: Normal range of motion. Neck supple. No tracheal deviation present. No thyroid mass present.  Cardiovascular: Normal rate, regular rhythm and normal heart sounds.  Exam reveals no gallop.   No murmur heard. Pulmonary/Chest: Effort normal and breath sounds normal. No stridor. No respiratory distress. She has no decreased breath sounds. She has no wheezes. She has no rhonchi. She has no rales.  Abdominal: Soft. Normal appearance and bowel sounds are normal. She exhibits no distension. There is no tenderness. There is no rebound and no CVA tenderness.  Musculoskeletal: Normal range of motion. She exhibits no edema or tenderness.  Neurological: She is alert and oriented to person, place, and time. She has normal strength. No cranial nerve deficit or sensory deficit. GCS eye subscore is 4. GCS verbal subscore is 5. GCS motor subscore is 6.  Skin: Skin is warm and dry. No abrasion and no rash noted.  Psychiatric: She has a normal mood and affect. Her speech is normal and behavior is normal.  Nursing note and vitals reviewed.   ED Course  Procedures (including critical care time) Labs Review Labs Reviewed  CBC WITH DIFFERENTIAL/PLATELET - Abnormal; Notable for the following:    WBC 12.1 (*)    All other components within normal limits  COMPREHENSIVE METABOLIC PANEL - Abnormal; Notable for the following:    GFR calc non Af Amer 58 (*)    All other components  within normal limits    Imaging Review Ct Head Wo Contrast  08/29/2014   CLINICAL DATA:  Visual alteration  EXAM: CT HEAD WITHOUT CONTRAST  TECHNIQUE: Contiguous axial images were obtained from the base of the skull through the vertex without intravenous contrast.  COMPARISON:  None.  FINDINGS: The ventricles are normal in size and configuration. There is no intracranial mass, hemorrhage, extra-axial fluid collection, or midline shift. There is slight small vessel disease in the centra semiovale bilaterally. Elsewhere gray-white compartments appear normal. No acute infarct is evident. Bony calvarium appears intact. The mastoid air cells are clear. There is a small retention cyst in the inferior left maxillary antrum.  IMPRESSION: Slight small vessel disease in the centra semiovale bilaterally. There is no intracranial mass, hemorrhage, or acute appearing infarct. Small retention cyst inferior left maxillary antrum.   Electronically Signed   By: Lowella Grip III M.D.   On: 08/29/2014 18:24     EKG Interpretation None      MDM   Final diagnoses:  None    Discussed with Dr. Talbert Forest on call for ophthalmology and if MRI of brain is negative patient will be discharged with follow-up  9:17 PM Mri neg, stable for d/c  Lacretia Leigh, MD 08/29/14 2117  Lacretia Leigh, MD 08/29/14 2118

## 2014-08-29 NOTE — Discharge Instructions (Signed)
Visual Disturbances You have had a disturbance in your vision. This may be caused by various conditions, such as:  Migraines. Migraine headaches are often preceded by a disturbance in vision. Blind spots or light flashes are followed by a headache. This type of visual disturbance is temporary. It does not damage the eye.  Glaucoma. This is caused by increased pressure in the eye. Symptoms include haziness, blurred vision, or seeing rainbow colored circles when looking at bright lights. Partial or complete visual loss can occur. You may or may not experience eye pain. Visual loss may be gradual or sudden and is irreversible. Glaucoma is the leading cause of blindness.  Retina problems. Vision will be reduced if the retina becomes detached or if there is a circulation problem as with diabetes, high blood pressure, or a mini-stroke. Symptoms include seeing "floaters," flashes of light, or shadows, as if a curtain has fallen over your eye.  Optic nerve problems. The main nerve in your eye can be damaged by redness, soreness, and swelling (inflammation), poor circulation, drugs, and toxins. It is very important to have a complete exam done by a specialist to determine the exact cause of your eye problem. The specialist may recommend medicines or surgery, depending on the cause of the problem. This can help prevent further loss of vision or reduce the risk of having a stroke. Contact the caregiver to whom you have been referred and arrange for follow-up care right away. SEEK IMMEDIATE MEDICAL CARE IF:   Your vision gets worse.  You develop severe headaches.  You have any weakness or numbness in the face, arms, or legs.  You have any trouble speaking or walking. Document Released: 04/07/2004 Document Revised: 05/23/2011 Document Reviewed: 07/29/2009 [facility] Patient Information 2015 [place], [place]. This information is not intended to replace advice given to you by your health care provider. Make sure  you discuss any questions you have with your health care provider.

## 2015-03-19 ENCOUNTER — Other Ambulatory Visit: Payer: Self-pay | Admitting: Family Medicine

## 2015-03-19 DIAGNOSIS — E2839 Other primary ovarian failure: Secondary | ICD-10-CM

## 2015-03-19 DIAGNOSIS — Z1231 Encounter for screening mammogram for malignant neoplasm of breast: Secondary | ICD-10-CM

## 2015-04-21 ENCOUNTER — Inpatient Hospital Stay: Admission: RE | Admit: 2015-04-21 | Payer: Medicare Other | Source: Ambulatory Visit

## 2015-04-21 ENCOUNTER — Ambulatory Visit: Payer: Medicare Other

## 2015-06-19 ENCOUNTER — Inpatient Hospital Stay: Admission: RE | Admit: 2015-06-19 | Payer: Medicare Other | Source: Ambulatory Visit

## 2015-06-19 ENCOUNTER — Ambulatory Visit: Payer: Medicare Other

## 2016-04-07 ENCOUNTER — Other Ambulatory Visit: Payer: Self-pay | Admitting: Family Medicine

## 2016-04-07 DIAGNOSIS — E2839 Other primary ovarian failure: Secondary | ICD-10-CM

## 2016-04-07 DIAGNOSIS — Z1231 Encounter for screening mammogram for malignant neoplasm of breast: Secondary | ICD-10-CM

## 2016-04-29 ENCOUNTER — Other Ambulatory Visit: Payer: Medicare Other

## 2016-04-29 ENCOUNTER — Ambulatory Visit: Payer: Medicare Other

## 2016-05-03 DIAGNOSIS — K641 Second degree hemorrhoids: Secondary | ICD-10-CM | POA: Insufficient documentation

## 2016-05-09 ENCOUNTER — Other Ambulatory Visit: Payer: Medicare Other

## 2016-05-09 ENCOUNTER — Ambulatory Visit: Payer: Medicare Other

## 2016-05-20 ENCOUNTER — Ambulatory Visit
Admission: RE | Admit: 2016-05-20 | Discharge: 2016-05-20 | Disposition: A | Discharge status: Home / Self Care | Payer: Medicare Other | Source: Ambulatory Visit | Attending: Family Medicine | Admitting: Family Medicine

## 2016-05-20 DIAGNOSIS — Z1231 Encounter for screening mammogram for malignant neoplasm of breast: Secondary | ICD-10-CM

## 2016-05-20 DIAGNOSIS — E2839 Other primary ovarian failure: Secondary | ICD-10-CM

## 2016-05-24 DIAGNOSIS — M85859 Other specified disorders of bone density and structure, unspecified thigh: Secondary | ICD-10-CM | POA: Insufficient documentation

## 2017-02-14 ENCOUNTER — Encounter: Payer: Self-pay | Admitting: *Deleted

## 2017-02-14 ENCOUNTER — Ambulatory Visit (INDEPENDENT_AMBULATORY_CARE_PROVIDER_SITE_OTHER): Payer: Medicare Other | Admitting: Family Medicine

## 2017-02-14 ENCOUNTER — Encounter: Payer: Self-pay | Admitting: Family Medicine

## 2017-02-14 VITALS — BP 130/80 | HR 63 | Temp 98.6°F | Ht 68.0 in | Wt 213.5 lb

## 2017-02-14 DIAGNOSIS — J431 Panlobular emphysema: Secondary | ICD-10-CM | POA: Diagnosis not present

## 2017-02-14 DIAGNOSIS — N959 Unspecified menopausal and perimenopausal disorder: Secondary | ICD-10-CM | POA: Diagnosis not present

## 2017-02-14 DIAGNOSIS — Z1211 Encounter for screening for malignant neoplasm of colon: Secondary | ICD-10-CM | POA: Diagnosis not present

## 2017-02-14 DIAGNOSIS — E039 Hypothyroidism, unspecified: Secondary | ICD-10-CM | POA: Diagnosis not present

## 2017-02-14 DIAGNOSIS — Z1159 Encounter for screening for other viral diseases: Secondary | ICD-10-CM

## 2017-02-14 DIAGNOSIS — I10 Essential (primary) hypertension: Secondary | ICD-10-CM

## 2017-02-14 DIAGNOSIS — E669 Obesity, unspecified: Secondary | ICD-10-CM | POA: Diagnosis not present

## 2017-02-14 DIAGNOSIS — F341 Dysthymic disorder: Secondary | ICD-10-CM | POA: Diagnosis not present

## 2017-02-14 DIAGNOSIS — F329 Major depressive disorder, single episode, unspecified: Secondary | ICD-10-CM

## 2017-02-14 DIAGNOSIS — E782 Mixed hyperlipidemia: Secondary | ICD-10-CM | POA: Diagnosis not present

## 2017-02-14 LAB — COMPREHENSIVE METABOLIC PANEL
ALT: 21 U/L (ref 0–35)
AST: 29 U/L (ref 0–37)
Albumin: 4.5 g/dL (ref 3.5–5.2)
Alkaline Phosphatase: 77 U/L (ref 39–117)
BUN: 11 mg/dL (ref 6–23)
CO2: 31 mEq/L (ref 19–32)
Calcium: 9.9 mg/dL (ref 8.4–10.5)
Chloride: 104 mEq/L (ref 96–112)
Creatinine, Ser: 0.81 mg/dL (ref 0.40–1.20)
GFR: 73.8 mL/min (ref >60.00)
Glucose, Bld: 126 mg/dL — ABNORMAL HIGH (ref 70–99)
Potassium: 4.3 mEq/L (ref 3.5–5.1)
Sodium: 142 mEq/L (ref 135–145)
Total Bilirubin: 0.9 mg/dL (ref 0.2–1.2)
Total Protein: 7.2 g/dL (ref 6.0–8.3)

## 2017-02-14 LAB — LIPID PANEL
Cholesterol: 185 mg/dL (ref 0–200)
HDL: 61.7 mg/dL (ref >39.00)
LDL Cholesterol: 95 mg/dL (ref 0–99)
NonHDL: 123.53
Total CHOL/HDL Ratio: 3
Triglycerides: 144 mg/dL (ref 0.0–149.0)
VLDL: 28.8 mg/dL (ref 0.0–40.0)

## 2017-02-14 LAB — TSH: TSH: 1.15 u[IU]/mL (ref 0.35–4.50)

## 2017-02-14 LAB — CBC WITH DIFFERENTIAL/PLATELET
Basophils Absolute: 0.1 10*3/uL (ref 0.0–0.1)
Basophils Relative: 0.6 % (ref 0.0–3.0)
Eosinophils Absolute: 0.3 10*3/uL (ref 0.0–0.7)
Eosinophils Relative: 3 % (ref 0.0–5.0)
HCT: 44.9 % (ref 36.0–46.0)
Hemoglobin: 15 g/dL (ref 12.0–15.0)
Lymphocytes Relative: 30.4 % (ref 12.0–46.0)
Lymphs Abs: 2.9 10*3/uL (ref 0.7–4.0)
MCHC: 33.5 g/dL (ref 30.0–36.0)
MCV: 93.9 fl (ref 78.0–100.0)
Monocytes Absolute: 0.8 10*3/uL (ref 0.1–1.0)
Monocytes Relative: 8.4 % (ref 3.0–12.0)
Neutro Abs: 5.5 10*3/uL (ref 1.4–7.7)
Neutrophils Relative %: 57.6 % (ref 43.0–77.0)
Platelets: 267 10*3/uL (ref 150.0–400.0)
RBC: 4.79 Mil/uL (ref 3.87–5.11)
RDW: 12.9 % (ref 11.5–15.5)
WBC: 9.5 10*3/uL (ref 4.0–10.5)

## 2017-02-14 MED ORDER — PHENTERMINE HCL 37.5 MG PO TABS: 37.5000 mg | ORAL_TABLET | Freq: Every day | ORAL | 2 refills | Status: DC

## 2017-02-14 MED ORDER — ALBUTEROL SULFATE HFA 108 (90 BASE) MCG/ACT IN AERS: 2.0000 | INHALATION_SPRAY | RESPIRATORY_TRACT | 5 refills | Status: DC | PRN

## 2017-02-14 NOTE — Patient Instructions (Addendum)
It was so good seeing you again! Thank you for establishing with my new practice and allowing me to continue caring for you. It means a lot to me.   Please schedule a follow up appointment with me in 6 months for blood pressure follow up.  You can also schedule and AWV with Kim at that time if it is convenient (or sooner).  We will order the cologuard test for your colon cancer screening test. Please send it in ASAP.  Please do these things to maintain good health!   Exercise at least 30-45 minutes a day,  4-5 days a week.   Eat a low-fat diet with lots of fruits and vegetables, up to 7-9 servings per day.  Drink plenty of water daily. Try to drink 8 8oz glasses per day.  Seatbelts can save your life. Always wear your seatbelt.  Place Smoke Detectors on every level of your home and check batteries every year.  Schedule an appointment with an eye doctor for an eye exam every 1-2 years  Safe sex - use condoms to protect yourself from STDs if you could be exposed to these types of infections. Use birth control if you do not want to become pregnant and are sexually active.  Avoid heavy alcohol use. If you drink, keep it to less than 2 drinks/day and not every day.  [place].  Choose someone you trust that could speak for you if you became unable to speak for yourself.  Depression is common in our stressful world.If you're feeling down or losing interest in things you normally enjoy, please come in for a visit.  If anyone is threatening or hurting you, please get help. Physical or Emotional Violence is never OK.

## 2017-02-14 NOTE — Progress Notes (Signed)
Subjective  CC:  Chief Complaint  Patient presents with  . Follow-up  . Hypertension  . Hypothyroidism    HPI: Susan Hunt is a 71 y.o. female who presents to the office today to address the problems listed above in the chief complaint. Former [place] patient. I have reviewed notes/labs from care everywhere.   Hypertension f/u: Control is good . Pt reports she is doing well. taking medications as instructed, no medication side effects noted, no TIAs, no chest pain on exertion, no dyspnea on exertion, no swelling of ankles. She denies adverse effects from his BP medications. Compliance with medication is good.   Follow-up hyperlipidemia: Patient is compliant with her Crestor and is managing this well.  She has no myalgias.  Diet has been worse over the last 6 months or so.  She has gained about 12 pounds.  Her appetite is not huge however she does tend to overeat chocolate and restaurant food.  Her exercise is also decreased.  She does belong to several gyms.  She is due for a cholesterol check along with LFTs today.  Hypothyroidism follow-up: She continues on her thyroid medication with good compliance.  She denies symptoms of low or high thyroid.  Energy level is good.  Thyroid has been well controlled.  COPD follow-up: She did have one COPD exacerbation back in September.  She feels that that is resolved and is back at her baseline.  She uses albuterol on occasion, typically before taking a walk or doing some exercise.  She denies chronic cough or intermittent shortness of breath.  We have tried a daily inhaler in the past but her compliance is poor and she is not interested in trying this again.  Major depression is well controlled on Effexor.  Weight management: As above noted, she has gained weight due to lifestyle changes.  She has used phentermine in the past to help get her back on track.  She requests this refill.  She is due for colon cancer screening.  Her last colonoscopy  was completed in 2012 by Dr. Collene Mares however it was an inadequate study due to incomplete bowel cleansing.  She is not wanting to repeat this test at this time.  She is an average risk patient.  Postmenopausal symptoms are rare but she does use an occasional clonidine to help her sleep.  BP Readings from Last 3 Encounters:  02/14/17 130/80  08/29/14 129/62  09/16/11 145/78   Wt Readings from Last 3 Encounters:  02/14/17 213 lb 8 oz (96.8 kg)  05/15/14 207 lb (93.9 kg)   I reviewed the patients updated PMH, FH, and SocHx.    Patient Active Problem List   Diagnosis Date Noted  . Panlobular emphysema ([place]) 06/17/2014    Priority: High  . Major depression, chronic 01/28/2014    Priority: High  . Obesity (BMI 30.0-34.9) 07/15/2013    Priority: High  . Acquired hypothyroidism 04/11/2007    Priority: High  . Mixed hyperlipidemia 04/11/2007    Priority: High  . Essential hypertension 04/11/2007    Priority: High  . Osteopenia of femoral neck 05/24/2016    Priority: Medium  . RENAL CALCULUS, HX OF 04/11/2007    Priority: Medium  . Postmenopausal symptoms 02/14/2017    Priority: Low  . Grade II hemorrhoids 05/03/2016    Priority: Low  . Snoring 05/06/2014    Priority: Low  . Allergic rhinitis 12/18/2009    Priority: Low  . Rosacea 04/11/2007    Priority: Low  .  URINARY INCONTINENCE 04/11/2007    Priority: Low   Allergies: Clarithromycin; Codeine; and Telmisartan  Social History: Patient  reports that  has never smoked. she has never used smokeless tobacco. She reports that she drinks about 0.6 oz of alcohol per week. She reports that she does not use drugs.  Current Meds  Medication Sig  . albuterol (PROVENTIL HFA;VENTOLIN HFA) 108 (90 Base) MCG/ACT inhaler Inhale 2 puffs into the lungs every 4 (four) hours as needed for wheezing or shortness of breath.  Marland Kitchen amLODipine (NORVASC) 10 MG tablet TAKE ONE TABLET BY MOUTH DAILY  . cloNIDine (CATAPRES) 0.1 MG tablet Take 0.1 mg by  mouth as needed for other or sleep.  . fluticasone (FLONASE) 50 MCG/ACT nasal spray Place 2 sprays into the nose daily.  Marland Kitchen levothyroxine (SYNTHROID, LEVOTHROID) 150 MCG tablet Take 150 mcg by mouth daily.  Marland Kitchen lisinopril-hydrochlorothiazide (PRINZIDE,ZESTORETIC) 20-12.5 MG tablet Take 1 tablet by mouth 2 (two) times daily.   Marland Kitchen loratadine (CLARITIN) 10 MG tablet Take 10 mg by mouth daily.  . metoprolol tartrate (LOPRESSOR) 50 MG tablet Take 50 mg by mouth 2 (two) times daily.   . montelukast (SINGULAIR) 10 MG tablet Take 10 mg by mouth at bedtime.  Marland Kitchen omeprazole (PRILOSEC) 20 MG capsule Take 20 mg by mouth as needed.   . rosuvastatin (CRESTOR) 10 MG tablet Take 10 mg by mouth daily.   Marland Kitchen venlafaxine XR (EFFEXOR-XR) 75 MG 24 hr capsule Take 75 mg by mouth daily.   . [DISCONTINUED] albuterol (PROVENTIL HFA;VENTOLIN HFA) 108 (90 Base) MCG/ACT inhaler Inhale into the lungs.  . [DISCONTINUED] lisinopril-hydrochlorothiazide (PRINZIDE,ZESTORETIC) 20-12.5 MG tablet Take by mouth.    Review of Systems: Cardiovascular: negative for chest pain, palpitations, leg swelling, orthopnea Respiratory: negative for SOB, wheezing or persistent cough Gastrointestinal: negative for abdominal pain Genitourinary: negative for dysuria or gross hematuria  Objective  Vitals: BP 130/80 (BP Location: Left Arm, Patient Position: Sitting, Cuff Size: Large)   Pulse 63   Temp 98.6 F (37 C) (Oral)   Ht _0  (1.727 m)   Wt 213 lb 8 oz (96.8 kg)   SpO2 94%   BMI 32.46 kg/m  General: no acute distress  Psych:  Alert and oriented, normal mood and affect HEENT:  Normocephalic, atraumatic, supple neck , no thyromegaly Cardiovascular:  RRR without murmur, no gallop. Tr peripheral edema Respiratory:  Good breath sounds bilaterally, CTAB with normal respiratory effort, no wheezing Skin:  Warm, no rashes, multiple sun damage changes on extremeties Neurologic:   Mental status is normal, no tremor  Assessment  1. Essential  hypertension   2. Mixed hyperlipidemia   3. Acquired hypothyroidism   4. Panlobular emphysema ([place])   5. Major depression, chronic   6. Obesity (BMI 30.0-34.9)   7. Encounter for hepatitis C screening test for low risk patient   8. Colon cancer screening   9. Postmenopausal symptoms      Plan    Hypertension f/u: BP control is well controlled.  Continue current medications without dose changes.  Check CMP today  Hyperlipidemia f/u: Continue Crestor and check lipids and LFTs today  Hypothyroidism f/u: Recheck TSH and continue supplementation  COPD f/u: Patient is clinically stable.  Continue as needed albuterol use.  Continue to follow for further medication needs.  Depression: This medical condition is well controlled. There are no signs of complications, medication side effects, or red flags. Patient is instructed to continue the current treatment plan without change in therapies or medications.  Obesity: Discussed weight management recommendations.  Recommend small frequent meals, start back walking daily and add strength training at the gym.  Patient is motivated to do so.  Will use phentermine to help her be successful.  Recheck in 3 months if needed.  Health maintenance: Recommend cologuard.  Hepatitis C screening done today.  Other health maintenance screens and immunizations are up-to-date.  Annual wellness visit is due  Education regarding management of these chronic disease states was given. Management strategies discussed on successive visits include dietary and exercise recommendations, goals of achieving and maintaining IBW, and lifestyle modifications aiming for adequate sleep and minimizing stressors.   Follow up: Return in about 6 months (around 08/15/2017) for follow up Hypertension.  Recommend annual wellness visit with Maudie Mercury   Commons side effects, risks, benefits, and alternatives for medications and treatment plan prescribed today were discussed, and the patient  expressed understanding of the given instructions. Patient is instructed to call or message via MyChart if he/she has any questions or concerns regarding our treatment plan. No barriers to understanding were identified. We discussed Red Flag symptoms and signs in detail. Patient expressed understanding regarding what to do in case of urgent or emergency type symptoms.   Medication list was reconciled, printed and provided to the patient in AVS. Patient instructions and summary information was reviewed with the patient as documented in the AVS. This note was prepared with assistance of Dragon voice recognition software. Occasional wrong-word or sound-a-like substitutions may have occurred due to the inherent limitations of voice recognition software  Orders Placed This Encounter  Procedures  . CBC with Differential/Platelet  . Comprehensive metabolic panel  . Lipid panel  . TSH  . Hepatitis C antibody   Meds ordered this encounter  Medications  . phentermine (ADIPEX-P) 37.5 MG tablet    Sig: Take 1 tablet (37.5 mg total) by mouth daily before breakfast.    Dispense:  30 tablet    Refill:  2  . albuterol (PROVENTIL HFA;VENTOLIN HFA) 108 (90 Base) MCG/ACT inhaler    Sig: Inhale 2 puffs into the lungs every 4 (four) hours as needed for wheezing or shortness of breath.    Dispense:  18 g    Refill:  5

## 2017-02-15 LAB — HEPATITIS C ANTIBODY
Hepatitis C Ab: NONREACTIVE
SIGNAL TO CUT-OFF: 0.01 (ref <1.00)

## 2017-02-24 LAB — COLOGUARD: Cologuard: NEGATIVE

## 2017-03-08 ENCOUNTER — Encounter: Payer: Self-pay | Admitting: Family Medicine

## 2017-05-23 ENCOUNTER — Other Ambulatory Visit: Payer: Self-pay | Admitting: Emergency Medicine

## 2017-05-23 DIAGNOSIS — I10 Essential (primary) hypertension: Secondary | ICD-10-CM

## 2017-05-23 MED ORDER — METOPROLOL TARTRATE 50 MG PO TABS: 50.0000 mg | ORAL_TABLET | Freq: Two times a day (BID) | ORAL | 3 refills | Status: DC

## 2017-05-25 ENCOUNTER — Other Ambulatory Visit: Payer: Self-pay | Admitting: Emergency Medicine

## 2017-05-25 MED ORDER — LEVOTHYROXINE SODIUM 137 MCG PO TABS: 137.0000 ug | ORAL_TABLET | Freq: Every day | ORAL | 3 refills | Status: DC

## 2017-05-25 MED ORDER — VENLAFAXINE HCL ER 75 MG PO CP24: 75.0000 mg | ORAL_CAPSULE | Freq: Every day | ORAL | 3 refills | Status: DC

## 2017-06-13 ENCOUNTER — Other Ambulatory Visit: Payer: Self-pay

## 2017-06-13 MED ORDER — LISINOPRIL-HYDROCHLOROTHIAZIDE 20-12.5 MG PO TABS: 1.0000 | ORAL_TABLET | Freq: Two times a day (BID) | ORAL | 3 refills | Status: DC

## 2017-06-13 MED ORDER — ROSUVASTATIN CALCIUM 10 MG PO TABS: 10.0000 mg | ORAL_TABLET | Freq: Every day | ORAL | 3 refills | Status: DC

## 2017-07-07 ENCOUNTER — Other Ambulatory Visit: Payer: Self-pay | Admitting: Emergency Medicine

## 2017-07-07 MED ORDER — MONTELUKAST SODIUM 10 MG PO TABS: 10.0000 mg | ORAL_TABLET | Freq: Every day | ORAL | 3 refills | Status: DC

## 2017-07-11 ENCOUNTER — Other Ambulatory Visit: Payer: Self-pay | Admitting: Emergency Medicine

## 2017-07-11 MED ORDER — AMLODIPINE BESYLATE 10 MG PO TABS: 10.0000 mg | ORAL_TABLET | Freq: Every day | ORAL | 3 refills | Status: DC

## 2017-08-14 DIAGNOSIS — R159 Full incontinence of feces: Secondary | ICD-10-CM | POA: Insufficient documentation

## 2017-08-15 NOTE — Progress Notes (Deleted)
Subjective:   Susan Hunt is a 72 y.o. female who presents for Medicare Annual (Subsequent) preventive examination.  Review of Systems:  No ROS.  Medicare Wellness Visit. Additional risk factors are reflected in the social history.    Sleep patterns: Home Safety/Smoke Alarms: Feels safe in home. Smoke alarms in place.  Living environment; residence and Firearm Safety:  [place] Safety/Bike Helmet: Wears seat belt.   Female:   Pap-N/A       Mammo-05/20/2016, BI-RADS CATEGORY  1: Negative       Dexa scan-05/20/2016, Osteopenia.         CCS-Cologuard 02/24/2017, negative.       Objective:     Vitals: There were no vitals taken for this visit.  There is no height or weight on file to calculate BMI.  Advanced Directives 08/29/2014  Does Patient Have a Medical Advance Directive? No  Would patient like information on creating a medical advance directive? No - patient declined information    Tobacco Social History   Tobacco Use  Smoking Status Never Smoker  Smokeless Tobacco Never Used     Counseling given: Not Answered    Past Medical History:  Diagnosis Date  . Asthma   . Depression   . Hemorrhoids   . Hyperlipidemia   . Hypertension   . Hypothyroidism   . Obesity   . Rosacea    Past Surgical History:  Procedure Laterality Date  . CATARACT EXTRACTION W/ INTRAOCULAR LENS IMPLANT Bilateral 2016  . DENTAL SURGERY  2016   Dental Implant  . FRACTURE SURGERY Left 2013   Left ankle  . NASAL SINUS SURGERY     Family History  Problem Relation Age of Onset  . Heart disease Mother   . Hypertension Mother   . Osteoporosis Mother   . Stroke Mother   . Alcohol abuse Father   . Diabetes Maternal Grandmother    Social History   Socioeconomic History  . Marital status: Married    Spouse name: Not on file  . Number of children: Not on file  . Years of education: Not on file  . Highest education level: Not on file  Occupational History  . Not on file    Social Needs  . Financial resource strain: Not on file  . Food insecurity:    Worry: Not on file    Inability: Not on file  . Transportation needs:    Medical: Not on file    Non-medical: Not on file  Tobacco Use  . Smoking status: Never Smoker  . Smokeless tobacco: Never Used  Substance and Sexual Activity  . Alcohol use: Yes    Alcohol/week: 0.6 oz    Types: 1 Glasses of wine per week    Comment: socially  . Drug use: No  . Sexual activity: Yes  Lifestyle  . Physical activity:    Days per week: Not on file    Minutes per session: Not on file  . Stress: Not on file  Relationships  . Social connections:    Talks on phone: Not on file    Gets together: Not on file    Attends religious service: Not on file    Active member of club or organization: Not on file    Attends meetings of clubs or organizations: Not on file    Relationship status: Not on file  Other Topics Concern  . Not on file  Social History Narrative  . Not on file  Outpatient Encounter Medications as of 08/16/2017  Medication Sig  . albuterol (PROVENTIL HFA;VENTOLIN HFA) 108 (90 Base) MCG/ACT inhaler Inhale 2 puffs into the lungs every 4 (four) hours as needed for wheezing or shortness of breath.  Marland Kitchen amLODipine (NORVASC) 10 MG tablet Take 1 tablet (10 mg total) by mouth daily.  . cloNIDine (CATAPRES) 0.1 MG tablet Take 0.1 mg by mouth as needed for other or sleep.  . fluticasone (FLONASE) 50 MCG/ACT nasal spray Place 2 sprays into the nose daily.  Marland Kitchen levothyroxine (SYNTHROID, LEVOTHROID) 137 MCG tablet Take 1 tablet (137 mcg total) by mouth daily before breakfast.  . lisinopril-hydrochlorothiazide (PRINZIDE,ZESTORETIC) 20-12.5 MG tablet Take 1 tablet by mouth 2 (two) times daily.  Marland Kitchen loratadine (CLARITIN) 10 MG tablet Take 10 mg by mouth daily.  . metoprolol tartrate (LOPRESSOR) 50 MG tablet Take 1 tablet (50 mg total) by mouth 2 (two) times daily.  . montelukast (SINGULAIR) 10 MG tablet Take 1 tablet (10 mg  total) by mouth at bedtime.  Marland Kitchen omeprazole (PRILOSEC) 20 MG capsule Take 20 mg by mouth as needed.   . phentermine (ADIPEX-P) 37.5 MG tablet Take 1 tablet (37.5 mg total) by mouth daily before breakfast.  . rosuvastatin (CRESTOR) 10 MG tablet Take 1 tablet (10 mg total) by mouth daily.  Marland Kitchen venlafaxine XR (EFFEXOR-XR) 75 MG 24 hr capsule Take 1 capsule (75 mg total) by mouth daily.   No facility-administered encounter medications on file as of 08/16/2017.     Activities of Daily Living No flowsheet data found.  Patient Care Team: Leamon Arnt, MD as PCP - General (Family Medicine) Fanny Bien, MD as Attending Physician (Family Medicine)    Assessment:   This is a routine wellness examination for [name].  Exercise Activities and Dietary recommendations   Diet (meal preparation, eat out, water intake, caffeinated beverages, dairy products, fruits and vegetables):   Breakfast: Lunch:  Dinner:      Goals    None      Fall Risk Fall Risk  02/14/2017  Falls in the past year? No   Depression Screen PHQ 2/9 Scores 02/14/2017  PHQ - 2 Score 0     Cognitive Function        Immunization History  Administered Date(s) Administered  . Influenza Split 01/06/2009  . Influenza, High Dose Seasonal PF 12/17/2014, 12/20/2015  . Influenza, Seasonal, Injecte, Preservative Fre 01/07/2014, 12/17/2014  . Influenza,trivalent, recombinat, inj, PF 12/03/2012  . Influenza-Unspecified 01/15/2017  . Pneumococcal Conjugate-13 01/07/2014  . Pneumococcal Polysaccharide-23 11/23/2011  . Td 03/14/2006, 10/16/2011  . Zoster 12/06/2013    Screening Tests Health Maintenance  Topic Date Due  . COLONOSCOPY  10/17/1994  . INFLUENZA VACCINE  10/12/2017  . MAMMOGRAM  05/21/2018  . TETANUS/TDAP  10/15/2021  . DEXA SCAN  Completed  . Hepatitis C Screening  Completed  . PNA vac Low Risk Adult  Completed       Plan:     I have personally reviewed and noted the following in the patient's  chart:   . Medical and social history . Use of alcohol, tobacco or illicit drugs  . Current medications and supplements . Functional ability and status . Nutritional status . Physical activity . Advanced directives . List of other physicians . Hospitalizations, surgeries, and ER visits in previous 12 months . Vitals . Screenings to include cognitive, depression, and falls . Referrals and appointments  In addition, I have reviewed and discussed with patient certain preventive protocols, quality metrics,  and best practice recommendations. A written personalized care plan for preventive services as well as general preventive health recommendations were provided to patient.     Gerilyn Nestle, RN  08/15/2017

## 2017-08-16 ENCOUNTER — Ambulatory Visit: Payer: Medicare Other | Admitting: Family Medicine

## 2017-08-16 ENCOUNTER — Ambulatory Visit: Payer: Medicare Other

## 2017-08-24 ENCOUNTER — Other Ambulatory Visit: Payer: Self-pay | Admitting: Family Medicine

## 2017-09-07 ENCOUNTER — Other Ambulatory Visit: Payer: Self-pay | Admitting: Family Medicine

## 2017-09-26 ENCOUNTER — Other Ambulatory Visit: Payer: Self-pay | Admitting: Family Medicine

## 2017-09-26 NOTE — Telephone Encounter (Signed)
Last OV 02/14/17, Next OV 10/02/17  Last filled 02/14/17, # 30 with 2 refills

## 2017-10-02 ENCOUNTER — Other Ambulatory Visit: Payer: Self-pay

## 2017-10-02 ENCOUNTER — Ambulatory Visit (INDEPENDENT_AMBULATORY_CARE_PROVIDER_SITE_OTHER): Payer: Medicare Other | Admitting: Family Medicine

## 2017-10-02 ENCOUNTER — Encounter: Payer: Self-pay | Admitting: Family Medicine

## 2017-10-02 VITALS — BP 130/90 | HR 62 | Temp 98.0°F | Ht 68.0 in | Wt 210.4 lb

## 2017-10-02 DIAGNOSIS — I10 Essential (primary) hypertension: Secondary | ICD-10-CM | POA: Diagnosis not present

## 2017-10-02 DIAGNOSIS — J431 Panlobular emphysema: Secondary | ICD-10-CM | POA: Diagnosis not present

## 2017-10-02 DIAGNOSIS — F329 Major depressive disorder, single episode, unspecified: Secondary | ICD-10-CM | POA: Diagnosis not present

## 2017-10-02 DIAGNOSIS — R058 Other specified cough: Secondary | ICD-10-CM | POA: Insufficient documentation

## 2017-10-02 DIAGNOSIS — R151 Fecal smearing: Secondary | ICD-10-CM

## 2017-10-02 DIAGNOSIS — R05 Cough: Secondary | ICD-10-CM

## 2017-10-02 DIAGNOSIS — T464X5A Adverse effect of angiotensin-converting-enzyme inhibitors, initial encounter: Secondary | ICD-10-CM

## 2017-10-02 MED ORDER — TRIAMTERENE-HCTZ 37.5-25 MG PO TABS: 1.0000 | ORAL_TABLET | Freq: Every day | ORAL | 3 refills | Status: DC

## 2017-10-02 MED ORDER — ASPIRIN EC 81 MG PO TBEC: 81.0000 mg | DELAYED_RELEASE_TABLET | Freq: Every day | ORAL | Status: DC

## 2017-10-02 NOTE — Progress Notes (Signed)
Subjective  CC:  Chief Complaint  Patient presents with  . Hypertension    doing well, no complaints    HPI: Susan Hunt is a 72 y.o. female who presents to the office today to address the problems listed above in the chief complaint.  Hypertension f/u: Control is fair . Pt reports she is doing well. taking medications as instructed, side effects noted by patient include dry cough, no TIAs, no chest pain on exertion, no dyspnea on exertion, no swelling of ankles.  She stopped her lisinopril HCTZ and cough resolved. denies adverse effects from his BP medications. Compliance with medication is good.  Blood pressure at recent specialist office has been fairly well-controlled although she admits that diastolics have been running a little bit high.  Being evaluated for fecal incontinence.  I reviewed notes from urogynecology and gastroenterology.  Had colonoscopy in March with tubular adenomas.  Working on pelvic floor strengthening.  Unfortunately, she has not had much improvement in her symptoms.  Mood is well controlled on medications  COPD: Stable.  Uses daily inhaler although she is not sure of the name.  Uses albuterol only intermittently.  Cardiovascular risk is high given hypertension, and history of long-term smoking.  Assessment  1. Essential hypertension   2. Fecal smearing   3. Major depression, chronic   4. Panlobular emphysema ([place])   5. ACE-inhibitor cough      Plan    Hypertension f/u: BP control is fairly well controlled.  Stopped ACE due to cough.  Change to [facility] daily.  Hyperlipidemia f/u: Continue statin  Start daily baby aspirin for cardiovascular risk reduction  Depression and COPD are stable on current medications.  Will clarify which inhaler she is taking daily.  Could be Spiriva.  Follow-up with your GYN for fecal smearing Education regarding management of these chronic disease states was given. Management strategies discussed on successive  visits include dietary and exercise recommendations, goals of achieving and maintaining IBW, and lifestyle modifications aiming for adequate sleep and minimizing stressors.   Follow up: Return in about 6 months (around 04/04/2018) for complete physical, AWV.  No orders of the defined types were placed in this encounter.  Meds ordered this encounter  Medications  . triamterene-hydrochlorothiazide (MAXZIDE-25) 37.5-25 MG tablet    Sig: Take 1 tablet by mouth daily.    Dispense:  90 tablet    Refill:  3  . aspirin EC 81 MG tablet    Sig: Take 1 tablet (81 mg total) by mouth daily.      BP Readings from Last 3 Encounters:  10/02/17 130/90  02/14/17 130/80  08/29/14 129/62   Wt Readings from Last 3 Encounters:  10/02/17 210 lb 6.4 oz (95.4 kg)  02/14/17 213 lb 8 oz (96.8 kg)  05/15/14 207 lb (93.9 kg)    Lab Results  Component Value Date   CHOL 185 02/14/2017   CHOL 211 (HH) 05/29/2006   Lab Results  Component Value Date   HDL 61.70 02/14/2017   HDL 53.2 05/29/2006   Lab Results  Component Value Date   LDLCALC 95 02/14/2017   Lab Results  Component Value Date   TRIG 144.0 02/14/2017   TRIG 111 05/29/2006   Lab Results  Component Value Date   CHOLHDL 3 02/14/2017   CHOLHDL 4.0 CALC 05/29/2006   Lab Results  Component Value Date   LDLDIRECT 146.7 05/29/2006   Lab Results  Component Value Date   CREATININE 0.81 02/14/2017   BUN  11 02/14/2017   NA 142 02/14/2017   K 4.3 02/14/2017   CL 104 02/14/2017   CO2 31 02/14/2017    The 10-year ASCVD risk score Mikey Bussing DC Jr., et al., 2013) is: 15.5%   Values used to calculate the score:     Age: 30 years     Sex: Female     Is Non-Hispanic African American: No     Diabetic: No     Tobacco smoker: No     Systolic Blood Pressure: 078 mmHg     Is BP treated: Yes     HDL Cholesterol: 61.7 mg/dL     Total Cholesterol: 185 mg/dL  I reviewed the patients updated PMH, FH, and SocHx.    Patient Active Problem List    Diagnosis Date Noted  . Panlobular emphysema ([place]) 06/17/2014    Priority: High  . Major depression, chronic 01/28/2014    Priority: High  . Obesity (BMI 30.0-34.9) 07/15/2013    Priority: High  . Acquired hypothyroidism 04/11/2007    Priority: High  . Mixed hyperlipidemia 04/11/2007    Priority: High  . Essential hypertension 04/11/2007    Priority: High  . Osteopenia of femoral neck 05/24/2016    Priority: Medium  . RENAL CALCULUS, HX OF 04/11/2007    Priority: Medium  . Postmenopausal symptoms 02/14/2017    Priority: Low  . Grade II hemorrhoids 05/03/2016    Priority: Low  . Snoring 05/06/2014    Priority: Low  . Allergic rhinitis 12/18/2009    Priority: Low  . Rosacea 04/11/2007    Priority: Low  . URINARY INCONTINENCE 04/11/2007    Priority: Low  . ACE-inhibitor cough 10/02/2017  . Incontinence of feces 08/14/2017    Allergies: Ace inhibitors; Clarithromycin; Codeine; and Telmisartan  Social History: Patient  reports that she has never smoked. She has never used smokeless tobacco. She reports that she drinks about 0.6 oz of alcohol per week. She reports that she does not use drugs.  Current Meds  Medication Sig  . albuterol (PROVENTIL HFA;VENTOLIN HFA) 108 (90 Base) MCG/ACT inhaler Inhale 2 puffs into the lungs every 4 (four) hours as needed for wheezing or shortness of breath.  Marland Kitchen amLODipine (NORVASC) 10 MG tablet Take 1 tablet (10 mg total) by mouth daily.  . cloNIDine (CATAPRES) 0.1 MG tablet Take 0.1 mg by mouth as needed for other or sleep.  . fluticasone (FLONASE) 50 MCG/ACT nasal spray Place 2 sprays into the nose daily.  Marland Kitchen levothyroxine (SYNTHROID, LEVOTHROID) 137 MCG tablet Take 1 tablet (137 mcg total) by mouth daily before breakfast.  . loratadine (CLARITIN) 10 MG tablet Take 10 mg by mouth daily.  . metoprolol tartrate (LOPRESSOR) 50 MG tablet Take 1 tablet (50 mg total) by mouth 2 (two) times daily.  . montelukast (SINGULAIR) 10 MG tablet Take 1 tablet  (10 mg total) by mouth at bedtime.  Marland Kitchen omeprazole (PRILOSEC) 20 MG capsule Take 20 mg by mouth as needed.   . rosuvastatin (CRESTOR) 10 MG tablet TAKE ONE TABLET BY MOUTH DAILY  . venlafaxine XR (EFFEXOR-XR) 75 MG 24 hr capsule Take 1 capsule (75 mg total) by mouth daily.  . [DISCONTINUED] lisinopril-hydrochlorothiazide (PRINZIDE,ZESTORETIC) 20-12.5 MG tablet TAKE ONE TABLET BY MOUTH TWICE A DAY    Review of Systems: Cardiovascular: negative for chest pain, palpitations, leg swelling, orthopnea, denies claudication Respiratory: negative for SOB, wheezing or persistent cough Gastrointestinal: negative for abdominal pain Genitourinary: negative for dysuria or gross hematuria  Objective  Vitals: BP  130/90   Pulse 62   Temp 98 F (36.7 C)   Ht _0  (1.727 m)   Wt 210 lb 6.4 oz (95.4 kg)   SpO2 98%   BMI 31.99 kg/m  General: no acute distress  Psych:  Alert and oriented, normal mood and affect HEENT:  Normocephalic, atraumatic, supple neck  Cardiovascular:  RRR without murmur. no edema, chronic skin changes bilateral lower extremities.  +2 distal pulses bilaterally Respiratory:  Good breath sounds bilaterally, CTAB with normal respiratory effort Skin:  Warm Neurologic:   Mental status is normal  Commons side effects, risks, benefits, and alternatives for medications and treatment plan prescribed today were discussed, and the patient expressed understanding of the given instructions. Patient is instructed to call or message via MyChart if he/she has any questions or concerns regarding our treatment plan. No barriers to understanding were identified. We discussed Red Flag symptoms and signs in detail. Patient expressed understanding regarding what to do in case of urgent or emergency type symptoms.   Medication list was reconciled, printed and provided to the patient in AVS. Patient instructions and summary information was reviewed with the patient as documented in the AVS. This note was  prepared with assistance of Dragon voice recognition software. Occasional wrong-word or sound-a-like substitutions may have occurred due to the inherent limitations of voice recognition software

## 2017-10-02 NOTE — Patient Instructions (Signed)
Please return in January for your annual complete physical; please come fasting.  Please start a baby aspirin once a day and the new blood pressure pill.   If you have any questions or concerns, please don't hesitate to send me a message via MyChart or call the office at [phone]. Thank you for visiting with [place] today! It's our pleasure caring for you.  Medicare recommends an Annual Wellness Visit for all patients. Please schedule this to be done with our Nurse Educator, Maudie Mercury. This is an informative "talk" visit; it's goals are to ensure that your health care needs are being met and to give you education regarding avoiding falls, ensuring you are not suffering from depression or problems with memory or thinking, and to educate you on Advance Care Planning. It helps me take good care of you!

## 2017-10-04 ENCOUNTER — Telehealth: Payer: Self-pay | Admitting: Family Medicine

## 2017-10-04 NOTE — Telephone Encounter (Signed)
Please advise. Does the Patient need an OV?

## 2017-10-04 NOTE — Telephone Encounter (Signed)
LM to [facility]

## 2017-10-04 NOTE — Telephone Encounter (Signed)
Copied from [place] [phone] [phone]. Topic: General - Other >> Oct 04, 2017 11:45 AM Lennox Solders wrote: Reason for CRM:  pt is calling and would like md to send in something for her pink eye. Pt was seen on 10/02/17. Pt would whether not have an appt if possible. Pt is having itchy ,redness and crusting up in left eye, [facility] [address]

## 2017-10-04 NOTE — Telephone Encounter (Signed)
She can wait to discuss with her PCP if not wanting to come in. I would have to see her before I would be willing to write a prescription. She can do an e-visit if she prefers.

## 2017-10-06 NOTE — Telephone Encounter (Signed)
Patient states that she has been doing warm compresses and her eye seems to be improving, I discussed with patient if symptoms worsen to call back for further evaluation.   Doloris Hall,  LPN

## 2017-12-04 ENCOUNTER — Telehealth: Payer: Self-pay | Admitting: Emergency Medicine

## 2017-12-04 NOTE — Telephone Encounter (Signed)
Please give her my condolences.  She may increase to 2 capsules daily, however this may take some time to take affect.  OV if needs more help to discuss other management strategies.

## 2017-12-04 NOTE — Telephone Encounter (Signed)
Copied from [place] [id number]. Topic: General - Other >> Dec 04, 2017  3:39 PM Carolyn Stare wrote:  Pt son passes away 10 days ago and she said she  is having a hard time and is asking if the below med can be increased    venlafaxine XR (EFFEXOR-XR) 75 MG 24 hr capsule  [place]  Please advise.   Doloris Hall,  LPN

## 2017-12-04 NOTE — Telephone Encounter (Signed)
Spoke with Patient and offered condolences. Informed Patient that she could increase to 2 capsules per day. She states that she has enough pills for now but will call back if she needs refills. Patient has an appt in October   Brenlyn Beshara [place],  LPN

## 2017-12-19 ENCOUNTER — Other Ambulatory Visit: Payer: Self-pay

## 2017-12-19 ENCOUNTER — Ambulatory Visit (INDEPENDENT_AMBULATORY_CARE_PROVIDER_SITE_OTHER): Payer: Medicare Other | Admitting: Family Medicine

## 2017-12-19 ENCOUNTER — Other Ambulatory Visit (HOSPITAL_COMMUNITY)
Admission: RE | Admit: 2017-12-19 | Discharge: 2017-12-19 | Disposition: A | Discharge status: Home / Self Care | Payer: Medicare Other | Source: Ambulatory Visit | Attending: Family Medicine | Admitting: Family Medicine

## 2017-12-19 ENCOUNTER — Encounter: Payer: Self-pay | Admitting: Family Medicine

## 2017-12-19 VITALS — BP 122/72 | HR 74 | Temp 98.0°F | Ht 68.0 in | Wt 211.0 lb

## 2017-12-19 DIAGNOSIS — Z1239 Encounter for other screening for malignant neoplasm of breast: Secondary | ICD-10-CM

## 2017-12-19 DIAGNOSIS — R8290 Unspecified abnormal findings in urine: Secondary | ICD-10-CM

## 2017-12-19 DIAGNOSIS — F4321 Adjustment disorder with depressed mood: Secondary | ICD-10-CM

## 2017-12-19 DIAGNOSIS — I10 Essential (primary) hypertension: Secondary | ICD-10-CM | POA: Diagnosis not present

## 2017-12-19 DIAGNOSIS — F329 Major depressive disorder, single episode, unspecified: Secondary | ICD-10-CM

## 2017-12-19 DIAGNOSIS — M85859 Other specified disorders of bone density and structure, unspecified thigh: Secondary | ICD-10-CM

## 2017-12-19 DIAGNOSIS — J431 Panlobular emphysema: Secondary | ICD-10-CM

## 2017-12-19 DIAGNOSIS — N898 Other specified noninflammatory disorders of vagina: Secondary | ICD-10-CM

## 2017-12-19 DIAGNOSIS — Z0000 Encounter for general adult medical examination without abnormal findings: Secondary | ICD-10-CM | POA: Diagnosis not present

## 2017-12-19 DIAGNOSIS — E782 Mixed hyperlipidemia: Secondary | ICD-10-CM

## 2017-12-19 DIAGNOSIS — E039 Hypothyroidism, unspecified: Secondary | ICD-10-CM | POA: Diagnosis not present

## 2017-12-19 LAB — CBC WITH DIFFERENTIAL/PLATELET
Basophils Absolute: 0.1 10*3/uL (ref 0.0–0.1)
Basophils Relative: 0.6 % (ref 0.0–3.0)
Eosinophils Absolute: 0.3 10*3/uL (ref 0.0–0.7)
Eosinophils Relative: 2.1 % (ref 0.0–5.0)
HCT: 44.6 % (ref 36.0–46.0)
Hemoglobin: 14.8 g/dL (ref 12.0–15.0)
Lymphocytes Relative: 19.5 % (ref 12.0–46.0)
Lymphs Abs: 2.4 10*3/uL (ref 0.7–4.0)
MCHC: 33.1 g/dL (ref 30.0–36.0)
MCV: 93.5 fl (ref 78.0–100.0)
Monocytes Absolute: 1 10*3/uL (ref 0.1–1.0)
Monocytes Relative: 8.2 % (ref 3.0–12.0)
Neutro Abs: 8.6 10*3/uL — ABNORMAL HIGH (ref 1.4–7.7)
Neutrophils Relative %: 69.6 % (ref 43.0–77.0)
Platelets: 282 10*3/uL (ref 150.0–400.0)
RBC: 4.77 Mil/uL (ref 3.87–5.11)
RDW: 12.7 % (ref 11.5–15.5)
WBC: 12.3 10*3/uL — ABNORMAL HIGH (ref 4.0–10.5)

## 2017-12-19 LAB — COMPREHENSIVE METABOLIC PANEL
ALT: 35 U/L (ref 0–35)
AST: 49 U/L — ABNORMAL HIGH (ref 0–37)
Albumin: 4.4 g/dL (ref 3.5–5.2)
Alkaline Phosphatase: 94 U/L (ref 39–117)
BUN: 14 mg/dL (ref 6–23)
CO2: 28 mEq/L (ref 19–32)
Calcium: 9.9 mg/dL (ref 8.4–10.5)
Chloride: 101 mEq/L (ref 96–112)
Creatinine, Ser: 1.11 mg/dL (ref 0.40–1.20)
GFR: 51.19 mL/min — ABNORMAL LOW (ref >60.00)
Glucose, Bld: 136 mg/dL — ABNORMAL HIGH (ref 70–99)
Potassium: 4.1 mEq/L (ref 3.5–5.1)
Sodium: 139 mEq/L (ref 135–145)
Total Bilirubin: 0.9 mg/dL (ref 0.2–1.2)
Total Protein: 7.6 g/dL (ref 6.0–8.3)

## 2017-12-19 LAB — POCT URINALYSIS DIPSTICK
Glucose, UA: NEGATIVE
Ketones, UA: NEGATIVE
Nitrite, UA: POSITIVE
Protein, UA: POSITIVE — AB
Spec Grav, UA: 1.02 (ref 1.010–1.025)
Urobilinogen, UA: 0.2 E.U./dL
pH, UA: 6 (ref 5.0–8.0)

## 2017-12-19 LAB — LIPID PANEL
Cholesterol: 148 mg/dL (ref 0–200)
HDL: 60.6 mg/dL (ref >39.00)
LDL Cholesterol: 65 mg/dL (ref 0–99)
NonHDL: 87.48
Total CHOL/HDL Ratio: 2
Triglycerides: 111 mg/dL (ref 0.0–149.0)
VLDL: 22.2 mg/dL (ref 0.0–40.0)

## 2017-12-19 LAB — TSH: TSH: 0.45 u[IU]/mL (ref 0.35–4.50)

## 2017-12-19 MED ORDER — TIOTROPIUM BROMIDE-OLODATEROL 2.5-2.5 MCG/ACT IN AERS: 2.0000 | INHALATION_SPRAY | Freq: Every day | RESPIRATORY_TRACT | 5 refills | Status: DC

## 2017-12-19 MED ORDER — VENLAFAXINE HCL ER 150 MG PO CP24: 150.0000 mg | ORAL_CAPSULE | Freq: Every day | ORAL | 1 refills | Status: DC

## 2017-12-19 NOTE — Progress Notes (Signed)
Subjective  Chief Complaint  Patient presents with  . Annual Exam    doing well  . Vaginal Itching    patient states that she has a vaginal odor as well, no discharge     HPI: Susan Hunt is a 72 y.o. female who presents to [place] [facility] today for a Female Wellness Visit. She also has the concerns and/or needs as listed above in the chief complaint. These will be addressed in addition to the Health Maintenance Visit.   Wellness Visit: annual visit with health maintenance review and exam without Pap   Susan Hunt's son died in December 11, 2022 from a drug overdose.  She is appropriately grieving.  We doubled her dose of Effexor last month and she says that has helped.  Otherwise, she reports she is been doing well enough.  Review of systems is positive for dyspnea with exertion, especially long walks.  Recent fall due to chasing her dogs in the kitchen.  Health maintenance: Due for mammogram.  Other screening tests are up-to-date.  Immunizations are up-to-date.  History of osteopenia on calcium and vitamin D.  Recheck due next year Chronic disease f/u and/or acute problem visit: (deemed necessary to be done in addition to the wellness visit):  COPD follow-up: As above, does well unless is very active.  Then experiences shortness of breath.  Taking Spiriva daily and intermittently using albuterol.  Breathing is improved she uses her albuterol.  No productive cough with sputum.  No fevers.  No shortness of breath at rest.  Remains tobacco free  Hypertension f/u: Control is good . Pt reports she is doing well. taking medications as instructed, no medication side effects noted, no TIAs, no chest pain on exertion, no dyspnea on exertion, no swelling of ankles. On maxzide.  She denies adverse effects from his BP medications. Compliance with medication is good.   Hyperlipidemia f/u: Patient presents for follow up of lipids. Lipids have been well controlled on meds w/o  myalgias or side effects. Compliance with treatment thus far has been good. The patient does not use medications that may worsen dyslipidemias (corticosteroids, progestins, anabolic steroids, diuretics, beta-blockers, amiodarone, cyclosporine, olanzapine). The patient exercises intermittently. The patient is not known to have coexisting coronary artery disease.  Hypothyroidism follow-up: Energy is stable.Had been unmotivated due to recent loss of son as expected.  Taking medications daily.  Due for recheck.  Depression: Grief reaction responded well to increasing SSRI as noted above..  More motivated recently.  Less crying spells.  Good support system at home.  No adverse effects from higher dose SSRI.  Complains of vaginal external itching with redness and vaginal odor.  No vaginal discharge.  Low risk for STDs.  No vaginal bleeding.  Is seeing urogynecology for urinary incontinence and fecal smearing.  Denies dysuria.  No fevers or chills.  No abdominal pain.  BP Readings from Last 3 Encounters:  12/19/17 122/72  10/02/17 130/90  02/14/17 130/80   Wt Readings from Last 3 Encounters:  12/19/17 211 lb (95.7 kg)  10/02/17 210 lb 6.4 oz (95.4 kg)  02/14/17 213 lb 8 oz (96.8 kg)    Lab Results  Component Value Date   CHOL 185 02/14/2017   CHOL 211 (HH) 05/29/2006   Lab Results  Component Value Date   HDL 61.70 02/14/2017   HDL 53.2 05/29/2006   Lab Results  Component Value Date   LDLCALC 95 02/14/2017   Lab Results  Component Value Date   TRIG  144.0 02/14/2017   TRIG 111 05/29/2006   Lab Results  Component Value Date   CHOLHDL 3 02/14/2017   CHOLHDL 4.0 CALC 05/29/2006   Lab Results  Component Value Date   LDLDIRECT 146.7 05/29/2006   Lab Results  Component Value Date   CREATININE 0.81 02/14/2017   BUN 11 02/14/2017   NA 142 02/14/2017   K 4.3 02/14/2017   CL 104 02/14/2017   CO2 31 02/14/2017    The 10-year ASCVD risk score Mikey Bussing DC Jr., et al., 2013) is: 15.4%    Values used to calculate the score:     Age: 46 years     Sex: Female     Is Non-Hispanic African American: No     Diabetic: No     Tobacco smoker: No     Systolic Blood Pressure: 811 mmHg     Is BP treated: Yes     HDL Cholesterol: 61.7 mg/dL     Total Cholesterol: 185 mg/dL  Assessment  1. Annual physical exam   2. Acquired hypothyroidism   3. Mixed hyperlipidemia   4. Essential hypertension   5. Panlobular emphysema ([place])   6. Major depression, chronic   7. Osteopenia of neck of femur, unspecified laterality   8. Grief reaction   9. Breast cancer screening   10. Vaginal itching      Plan  Female Wellness Visit:  Age appropriate Health Maintenance and Prevention measures were discussed with patient. Included topics are cancer screening recommendations, ways to keep healthy (see AVS) including dietary and exercise recommendations, regular eye and dental care, use of seat belts, and avoidance of moderate alcohol use and tobacco use. Ordered mammogram  BMI: discussed patient's BMI and encouraged positive lifestyle modifications to help get to or maintain a target BMI.  HM needs and immunizations were addressed and ordered. See below for orders. See HM and immunization section for updates.  Routine labs and screening tests ordered including cmp, cbc and lipids where appropriate.  Discussed recommendations regarding Vit D and calcium supplementation (see AVS)  Chronic disease management visit and/or acute problem visit:  Hypertension well controlled on diuretic.  Recheck renal function electrolytes.  Hyperlipidemia with history of good control on statin.  Recheck LFTs and lipid panel.  Continue aspirin for primary prevention.  COPD: Stable clinically although possibly could benefit from new inhaler.  Change from Spiriva to [employer].  Hypothyroidism: Euthyroid clinically.  Recheck TSH.  Continue medication.  Major depression with grief reaction: Continue high-dose Effexor  for the next 6 to 12 months.  Recheck 6 months.  Vaginal itching: Possible yeast infection.  Monistat cream externally.  Await vaginal testing and treat for other infections if positive.  Patient aware. Follow up: Return in about 6 months (around 06/20/2018) for follow up Hypertension.  Orders Placed This Encounter  Procedures  . MM DIGITAL SCREENING BILATERAL  . CBC with Differential/Platelet  . Comprehensive metabolic panel  . Lipid panel  . TSH  . POCT urinalysis dipstick   Meds ordered this encounter  Medications  . venlafaxine XR (EFFEXOR-XR) 150 MG 24 hr capsule    Sig: Take 1 capsule (150 mg total) by mouth daily.    Dispense:  90 capsule    Refill:  1      Lifestyle: Body mass index is 32.08 kg/m. Wt Readings from Last 3 Encounters:  12/19/17 211 lb (95.7 kg)  10/02/17 210 lb 6.4 oz (95.4 kg)  02/14/17 213 lb 8 oz (96.8 kg)  Patient Active Problem List   Diagnosis Date Noted  . Panlobular emphysema ([place]) 06/17/2014    Priority: High    Overview:  Long-term former smoker, chest CT March 2016 shows early fibrotic changes and apices and emphysematous changes.  Inhalers started   . Major depression, chronic 01/28/2014    Priority: High  . Obesity (BMI 30.0-34.9) 07/15/2013    Priority: High  . Acquired hypothyroidism 04/11/2007    Priority: High  . Mixed hyperlipidemia 04/11/2007    Priority: High  . Essential hypertension 04/11/2007    Priority: High  . Osteopenia of femoral neck 05/24/2016    Priority: Medium    Overview:  Dexa 05/2016 T = -1.4; stable. No significant decrease. Recheck in 2-3 years.   Marland Kitchen RENAL CALCULUS, HX OF 04/11/2007    Priority: Medium    Qualifier: Diagnosis of  By: Hulan Saas, CMA (AAMA), [place] Skye    . Postmenopausal symptoms 02/14/2017    Priority: Low    Uses clonidine prn at night to help her sleep IF needed for hot flushes or poor sleep. Started by GYN years ago. occasional use.   . Grade II hemorrhoids 05/03/2016     Priority: Low  . Snoring 05/06/2014    Priority: Low    Overview:  Negative sleep study 2013   . Allergic rhinitis 12/18/2009    Priority: Low    Overview:  10/1 IMO update   . Rosacea 04/11/2007    Priority: Low    Qualifier: Diagnosis of  By: Hulan Saas, CMA (AAMA), Shannon S    . URINARY INCONTINENCE 04/11/2007    Priority: Low    Qualifier: Diagnosis of  By: Regis Bill MD, Standley Brooking    . ACE-inhibitor cough 10/02/2017  . Incontinence of feces 08/14/2017   Health Maintenance  Topic Date Due  . MAMMOGRAM  05/20/2017  . DEXA SCAN  05/21/2018  . COLONOSCOPY  05/12/2020  . TETANUS/TDAP  10/15/2021  . INFLUENZA VACCINE  Completed  . Hepatitis C Screening  Completed  . PNA vac Low Risk Adult  Completed   Immunization History  Administered Date(s) Administered  . Influenza Split 01/06/2009  . Influenza, High Dose Seasonal PF 12/17/2014, 12/20/2015, 01/15/2017, 12/18/2017  . Influenza, Seasonal, Injecte, Preservative Fre 01/07/2014, 12/17/2014  . Influenza,trivalent, recombinat, inj, PF 12/03/2012  . Influenza-Unspecified 01/15/2017  . Pneumococcal Conjugate-13 01/07/2014  . Pneumococcal Polysaccharide-23 11/23/2011  . Td 03/14/2006, 10/16/2011  . Zoster 12/06/2013  . Zoster Recombinat (Shingrix) 10/21/2017, 12/18/2017   We updated and reviewed the patient's past history in detail and it is documented below. Allergies: Patient is allergic to ace inhibitors; clarithromycin; codeine; and telmisartan. Past Medical History Patient  has a past medical history of Asthma, Depression, Hemorrhoids, Hyperlipidemia, Hypertension, Hypothyroidism, Obesity, and Rosacea. Past Surgical History Patient  has a past surgical history that includes Nasal sinus surgery; Dental surgery (2016); Cataract extraction w/ intraocular lens implant (Bilateral, 2016); and Fracture surgery (Left, 2013). Family History: Patient family history includes Alcohol abuse in her father; Diabetes in her maternal  grandmother; Drug abuse in her son; Heart disease in her mother; Hypertension in her mother; Osteoporosis in her mother; Stroke in her mother. Social History:  Patient  reports that she has never smoked. She has never used smokeless tobacco. She reports that she drinks about 1.0 standard drinks of alcohol per week. She reports that she does not use drugs.  Review of Systems: Constitutional: negative for fever or malaise Ophthalmic: negative for photophobia, double vision or loss of vision Cardiovascular:  negative for chest pain, dyspnea on exertion, or new LE swelling Respiratory: negative for SOB or persistent cough Gastrointestinal: negative for abdominal pain, change in bowel habits or melena Genitourinary: negative for dysuria or gross hematuria, no abnormal uterine bleeding or disharge Musculoskeletal: negative for new gait disturbance or muscular weakness Integumentary: negative for new or persistent rashes, no breast lumps Neurological: negative for TIA or stroke symptoms Psychiatric: negative for SI or delusions Allergic/Immunologic: negative for hives  Patient Care Team    Relationship Specialty Notifications Start End  Leamon Arnt, MD PCP - General Family Medicine  10/04/17   Elroy Channel, MD Referring Physician Internal Medicine  10/02/17     Objective  Vitals: BP 122/72   Pulse 74   Temp 98 F (36.7 C)   Ht _0  (1.727 m)   Wt 211 lb (95.7 kg)   SpO2 99%   BMI 32.08 kg/m  General:  Well developed, well nourished, no acute distress  Psych:  Alert and orientedx3,normal mood and affect, appropriately tearful HEENT:  Normocephalic, atraumatic, non-icteric sclera, PERRL, oropharynx is clear without mass or exudate, supple neck without adenopathy, mass or thyromegaly Cardiovascular:  Normal S1, S2, RRR without gallop, rub or murmur, nondisplaced PMI Respiratory:  Good breath sounds bilaterally, CTAB with normal respiratory effort Gastrointestinal: normal bowel  sounds, soft, non-tender, no noted masses. No HSM MSK: no deformities, contusions. Joints are without erythema or swelling. Spine and CVA region are nontender Skin:  Warm, chronic skin damage changes noted throughout. Neurologic:    Mental status is normal. CN 2-11 are normal. Gross motor and sensory exams are normal. Normal gait. No tremor Breast Exam: No mass, skin retraction or nipple discharge is appreciated in either breast. No axillary adenopathy. Fibrocystic changes are not noted Pelvic Exam: external genitalia is red w/o discharge, no vulvar or vaginal lesions present. C Office Visit on 12/19/2017  Component Date Value Ref Range Status  . Color, UA 12/19/2017 yellow   Final  . Clarity, UA 12/19/2017 clear   Final  . Glucose, UA 12/19/2017 Negative  Negative Final  . Bilirubin, UA 12/19/2017 1+   Final  . Ketones, UA 12/19/2017 Negative   Final  . Spec Grav, UA 12/19/2017 1.020  1.010 - 1.025 Final  . Blood, UA 12/19/2017 1+   Final  . pH, UA 12/19/2017 6.0  5.0 - 8.0 Final  . Protein, UA 12/19/2017 Positive* Negative Final  . Urobilinogen, UA 12/19/2017 0.2  0.2 or 1.0 E.U./dL Final  . Nitrite, UA 12/19/2017 Positive   Final  . Leukocytes, UA 12/19/2017 Moderate (2+)* Negative Final     Commons side effects, risks, benefits, and alternatives for medications and treatment plan prescribed today were discussed, and the patient expressed understanding of the given instructions. Patient is instructed to call or message via MyChart if he/she has any questions or concerns regarding our treatment plan. No barriers to understanding were identified. We discussed Red Flag symptoms and signs in detail. Patient expressed understanding regarding what to do in case of urgent or emergency type symptoms.   Medication list was reconciled, printed and provided to the patient in AVS. Patient instructions and summary information was reviewed with the patient as documented in the AVS. This note was  prepared with assistance of Dragon voice recognition software. Occasional wrong-word or sound-a-like substitutions may have occurred due to the inherent limitations of voice recognition software

## 2017-12-19 NOTE — Patient Instructions (Addendum)
Please return in 6 months for follow up of your hypertension. AWV at patient's convenience.   Let me know what inhaler you are on daily. I may switch it.   Medicare recommends an Annual Wellness Visit for all patients. Please schedule this to be done with our Nurse Educator, Maudie Mercury. This is an informative "talk" visit; it's goals are to ensure that your health care needs are being met and to give you education regarding avoiding falls, ensuring you are not suffering from depression or problems with memory or thinking, and to educate you on Advance Care Planning. It helps me take good care of you!  I will release your lab results to you on your MyChart account with further instructions. Please reply with any questions.  We will call you with information regarding your referral appointment. Mammogram.  If you do not hear from [place] within the next 2 weeks, please let me know. It can take 1-2 weeks to get appointments set up with the specialists.   If you have any questions or concerns, please don't hesitate to send me a message via MyChart or call the office at [phone]. Thank you for visiting with [place] today! It's our pleasure caring for you.  Please do these things to maintain good health!   Exercise at least 30-45 minutes a day,  4-5 days a week.   Eat a low-fat diet with lots of fruits and vegetables, up to 7-9 servings per day.  Drink plenty of water daily. Try to drink 8 8oz glasses per day.  Seatbelts can save your life. Always wear your seatbelt.  Place Smoke Detectors on every level of your home and check batteries every year.  Schedule an appointment with an eye doctor for an eye exam every 1-2 years  Safe sex - use condoms to protect yourself from STDs if you could be exposed to these types of infections. Use birth control if you do not want to become pregnant and are sexually active.  Avoid heavy alcohol use. If you drink, keep it to less than 2 drinks/day and not every  day.  [place].  Choose someone you trust that could speak for you if you became unable to speak for yourself.  Depression is common in our stressful world.If you're feeling down or losing interest in things you normally enjoy, please come in for a visit.  If anyone is threatening or hurting you, please get help. Physical or Emotional Violence is never OK.

## 2017-12-20 LAB — CERVICOVAGINAL ANCILLARY ONLY
Bacterial vaginitis: NEGATIVE
Candida vaginitis: POSITIVE — AB
Trichomonas: NEGATIVE

## 2017-12-21 ENCOUNTER — Other Ambulatory Visit (INDEPENDENT_AMBULATORY_CARE_PROVIDER_SITE_OTHER): Payer: Medicare Other

## 2017-12-21 DIAGNOSIS — R7309 Other abnormal glucose: Secondary | ICD-10-CM | POA: Diagnosis not present

## 2017-12-21 LAB — URINE CULTURE
MICRO NUMBER:: 91208894
SPECIMEN QUALITY:: ADEQUATE

## 2017-12-21 LAB — HEMOGLOBIN A1C: Hgb A1c MFr Bld: 6.7 % — ABNORMAL HIGH (ref 4.6–6.5)

## 2017-12-21 MED ORDER — CIPROFLOXACIN HCL 500 MG PO TABS: 500.0000 mg | ORAL_TABLET | Freq: Two times a day (BID) | ORAL | 0 refills | Status: DC

## 2017-12-21 MED ORDER — NITROFURANTOIN MONOHYD MACRO 100 MG PO CAPS: 100.0000 mg | ORAL_CAPSULE | Freq: Two times a day (BID) | ORAL | 0 refills | Status: DC

## 2017-12-21 MED ORDER — FLUCONAZOLE 150 MG PO TABS: ORAL_TABLET | ORAL | 0 refills | Status: DC

## 2017-12-21 NOTE — Addendum Note (Signed)
Addended by: Billey Chang on: 12/21/2017 01:59 PM   Modules accepted: Orders

## 2017-12-21 NOTE — Progress Notes (Signed)
Please call patient: I have reviewed his/her lab results. Urine infection is resistant to cipro. Changed to macrobid. Please notify pt.

## 2017-12-21 NOTE — Addendum Note (Signed)
Addended by: Billey Chang on: 12/21/2017 07:54 AM   Modules accepted: Orders

## 2017-12-28 ENCOUNTER — Telehealth: Payer: Self-pay | Admitting: Family Medicine

## 2017-12-28 NOTE — Telephone Encounter (Signed)
Pt given lab results per notes of Dr Jonni Sanger on 12/22/17. Pt verbalized understanding. Pt states that she already has f/u appointment.

## 2018-01-04 ENCOUNTER — Telehealth: Payer: Self-pay | Admitting: Emergency Medicine

## 2018-01-04 ENCOUNTER — Encounter: Payer: Self-pay | Admitting: Emergency Medicine

## 2018-01-04 NOTE — Telephone Encounter (Signed)
Copied from [place] [id number]. Topic: General - Other >> Jan 04, 2018  3:49 PM Janace Aris A wrote: Reason for CRM: Pt called in, she says she already seen her lab results on My-Chart. She says she would like [place] to not call her anymore. She says just leave her a message saying to check her My-Chart.   Noted

## 2018-01-05 ENCOUNTER — Encounter: Payer: Self-pay | Admitting: Family Medicine

## 2018-01-05 MED ORDER — CLONIDINE HCL 0.1 MG PO TABS: 0.1000 mg | ORAL_TABLET | Freq: Every evening | ORAL | 1 refills | Status: DC | PRN

## 2018-01-26 ENCOUNTER — Ambulatory Visit
Admission: RE | Admit: 2018-01-26 | Discharge: 2018-01-26 | Disposition: A | Discharge status: Home / Self Care | Payer: Medicare Other | Source: Ambulatory Visit | Attending: Family Medicine | Admitting: Family Medicine

## 2018-01-26 DIAGNOSIS — Z1239 Encounter for other screening for malignant neoplasm of breast: Secondary | ICD-10-CM

## 2018-01-29 ENCOUNTER — Encounter: Payer: Self-pay | Admitting: Family Medicine

## 2018-01-29 ENCOUNTER — Ambulatory Visit (INDEPENDENT_AMBULATORY_CARE_PROVIDER_SITE_OTHER): Payer: Medicare Other | Admitting: Family Medicine

## 2018-01-29 VITALS — BP 118/78 | HR 68 | Temp 97.9°F | Resp 16 | Ht 68.0 in | Wt 210.0 lb

## 2018-01-29 DIAGNOSIS — J111 Influenza due to unidentified influenza virus with other respiratory manifestations: Secondary | ICD-10-CM

## 2018-01-29 DIAGNOSIS — J431 Panlobular emphysema: Secondary | ICD-10-CM | POA: Diagnosis not present

## 2018-01-29 MED ORDER — BENZONATATE 100 MG PO CAPS: 100.0000 mg | ORAL_CAPSULE | Freq: Two times a day (BID) | ORAL | 0 refills | Status: DC | PRN

## 2018-01-29 MED ORDER — PREDNISONE 20 MG PO TABS: ORAL_TABLET | ORAL | 0 refills | Status: DC

## 2018-01-29 NOTE — Progress Notes (Signed)
Subjective  CC:  Chief Complaint  Patient presents with  . Cough    10 days -      HPI: SUBJECTIVE:  Susan Hunt is a 72 y.o. female who was evaluated [facility] twice over the last 10 days.  Reportedly was diagnosed with influenza by nasal swab.  Treated with Tamiflu and Hydromet.  Cough persisted while other symptoms improved.  She returned 2 days ago and was treated with doxycycline.  She reports her lungs were clear.  She is using albuterol inhaler as needed.  Cough is mildly improved today.  No shortness of breath now.  Assessment  1. Panlobular emphysema ([place])   2. Influenza      Plan  Discussion:  Influenza with persistent cough and patient with emphysema: Patient on doxycycline, will continue although not certain if needed.  Add prednisone burst and Tessalon Perles.  Supportive care.  Should do well.  Lungs are clear now.  Vitals are stable Follow up: As scheduled  No orders of the defined types were placed in this encounter.  Meds ordered this encounter  Medications  . predniSONE (DELTASONE) 20 MG tablet    Sig: Take 3 tabs daily for 5 days    Dispense:  15 tablet    Refill:  0  . benzonatate (TESSALON) 100 MG capsule    Sig: Take 1 capsule (100 mg total) by mouth 2 (two) times daily as needed for cough.    Dispense:  20 capsule    Refill:  0      I reviewed the patients updated PMH, FH, and SocHx.  Social History: Patient  reports that she has never smoked. She has never used smokeless tobacco. She reports that she drinks about 1.0 standard drinks of alcohol per week. She reports that she does not use drugs.  Patient Active Problem List   Diagnosis Date Noted  . Panlobular emphysema ([place]) 06/17/2014    Priority: High  . Major depression, chronic 01/28/2014    Priority: High  . Obesity (BMI 30.0-34.9) 07/15/2013    Priority: High  . Acquired hypothyroidism 04/11/2007    Priority: High  . Mixed hyperlipidemia 04/11/2007    Priority: High    . Essential hypertension 04/11/2007    Priority: High  . Osteopenia of femoral neck 05/24/2016    Priority: Medium  . RENAL CALCULUS, HX OF 04/11/2007    Priority: Medium  . Postmenopausal symptoms 02/14/2017    Priority: Low  . Grade II hemorrhoids 05/03/2016    Priority: Low  . Snoring 05/06/2014    Priority: Low  . Allergic rhinitis 12/18/2009    Priority: Low  . Rosacea 04/11/2007    Priority: Low  . URINARY INCONTINENCE 04/11/2007    Priority: Low  . ACE-inhibitor cough 10/02/2017  . Incontinence of feces 08/14/2017    Review of Systems: Cardiovascular: negative for chest pain Respiratory: negative for SOB or hemoptysis Gastrointestinal: negative for abdominal pain Genitourinary: negative for dysuria or gross hematuria Current Meds  Medication Sig  . albuterol (PROVENTIL HFA;VENTOLIN HFA) 108 (90 Base) MCG/ACT inhaler Inhale 2 puffs into the lungs every 4 (four) hours as needed for wheezing or shortness of breath.  Marland Kitchen amLODipine (NORVASC) 10 MG tablet Take 1 tablet (10 mg total) by mouth daily.  Marland Kitchen aspirin EC 81 MG tablet Take 1 tablet (81 mg total) by mouth daily.  . cloNIDine (CATAPRES) 0.1 MG tablet Take 1 tablet (0.1 mg total) by mouth at bedtime as needed (sleep).  Marland Kitchen  doxycycline (DORYX) 100 MG EC tablet Take 100 mg by mouth 2 (two) times daily.  . fluconazole (DIFLUCAN) 150 MG tablet Take one tablet today; may repeat in 3 days if symptoms persist  . fluticasone (FLONASE) 50 MCG/ACT nasal spray Place 2 sprays into the nose daily.  Marland Kitchen levothyroxine (SYNTHROID, LEVOTHROID) 137 MCG tablet Take 1 tablet (137 mcg total) by mouth daily before breakfast.  . loratadine (CLARITIN) 10 MG tablet Take 10 mg by mouth daily.  . metoprolol tartrate (LOPRESSOR) 50 MG tablet Take 1 tablet (50 mg total) by mouth 2 (two) times daily.  . montelukast (SINGULAIR) 10 MG tablet Take 1 tablet (10 mg total) by mouth at bedtime.  Marland Kitchen omeprazole (PRILOSEC) 20 MG capsule Take 20 mg by mouth as  needed.   . rosuvastatin (CRESTOR) 10 MG tablet TAKE ONE TABLET BY MOUTH DAILY  . Tiotropium Bromide-Olodaterol (STIOLTO RESPIMAT) 2.5-2.5 MCG/ACT AERS Inhale 2 puffs into the lungs daily.  Marland Kitchen triamterene-hydrochlorothiazide (MAXZIDE-25) 37.5-25 MG tablet Take 1 tablet by mouth daily.  Marland Kitchen venlafaxine XR (EFFEXOR-XR) 150 MG 24 hr capsule Take 1 capsule (150 mg total) by mouth daily.  . [DISCONTINUED] nitrofurantoin, macrocrystal-monohydrate, (MACROBID) 100 MG capsule Take 1 capsule (100 mg total) by mouth 2 (two) times daily.    Objective  Vitals: BP 118/78   Pulse 68   Temp 97.9 F (36.6 C) (Oral)   Resp 16   Ht _0  (1.727 m)   Wt 210 lb (95.3 kg)   SpO2 97%   BMI 31.93 kg/m  General: no acute distress but harsh hacking cough present and spasms Psych:  Alert and oriented, normal mood and affect HEENT:  Normocephalic, atraumatic, supple neck, moist mucous membranes, mildly erythematous pharynx without exudate, without lymphadenopathy, supple neck Cardiovascular:  RRR without murmur. no edema Respiratory: Distant breath sounds bilaterally, CTAB with normal respiratory effort without wheezing rales or rhonchi skin:  Warm, no rashes Neurologic:   Mental status is normal. normal gait  Commons side effects, risks, benefits, and alternatives for medications and treatment plan prescribed today were discussed, and the patient expressed understanding of the given instructions. Patient is instructed to call or message via MyChart if he/she has any questions or concerns regarding our treatment plan. No barriers to understanding were identified. We discussed Red Flag symptoms and signs in detail. Patient expressed understanding regarding what to do in case of urgent or emergency type symptoms.  Medication list was reconciled, printed and provided to the patient in AVS. Patient instructions and summary information was reviewed with the patient as documented in the AVS. This note was prepared with  assistance of Dragon voice recognition software. Occasional wrong-word or sound-a-like substitutions may have occurred due to the inherent limitations of voice recognition software

## 2018-01-29 NOTE — Patient Instructions (Signed)
Please follow up if symptoms do not improve or as needed.   

## 2018-02-14 ENCOUNTER — Ambulatory Visit: Payer: Medicare Other | Admitting: Family Medicine

## 2018-03-14 HISTORY — PX: ANTERIOR AND POSTERIOR VAGINAL REPAIR: SUR5

## 2018-04-02 ENCOUNTER — Other Ambulatory Visit: Payer: Self-pay

## 2018-04-02 ENCOUNTER — Encounter: Payer: Self-pay | Admitting: Family Medicine

## 2018-04-02 ENCOUNTER — Ambulatory Visit (INDEPENDENT_AMBULATORY_CARE_PROVIDER_SITE_OTHER): Payer: Medicare Other | Admitting: Family Medicine

## 2018-04-02 VITALS — BP 142/78 | HR 72 | Temp 98.5°F | Resp 16 | Ht 68.0 in | Wt 207.0 lb

## 2018-04-02 DIAGNOSIS — R1011 Right upper quadrant pain: Secondary | ICD-10-CM | POA: Diagnosis not present

## 2018-04-02 DIAGNOSIS — R110 Nausea: Secondary | ICD-10-CM | POA: Diagnosis not present

## 2018-04-02 DIAGNOSIS — Z205 Contact with and (suspected) exposure to viral hepatitis: Secondary | ICD-10-CM | POA: Diagnosis not present

## 2018-04-02 LAB — CBC WITH DIFFERENTIAL/PLATELET
Basophils Absolute: 0.1 10*3/uL (ref 0.0–0.1)
Basophils Relative: 0.8 % (ref 0.0–3.0)
Eosinophils Absolute: 0.5 10*3/uL (ref 0.0–0.7)
Eosinophils Relative: 4.1 % (ref 0.0–5.0)
HCT: 48.2 % — ABNORMAL HIGH (ref 36.0–46.0)
Hemoglobin: 15.8 g/dL — ABNORMAL HIGH (ref 12.0–15.0)
Lymphocytes Relative: 31.3 % (ref 12.0–46.0)
Lymphs Abs: 3.5 10*3/uL (ref 0.7–4.0)
MCHC: 32.8 g/dL (ref 30.0–36.0)
MCV: 93.3 fl (ref 78.0–100.0)
Monocytes Absolute: 0.8 10*3/uL (ref 0.1–1.0)
Monocytes Relative: 7.1 % (ref 3.0–12.0)
Neutro Abs: 6.4 10*3/uL (ref 1.4–7.7)
Neutrophils Relative %: 56.7 % (ref 43.0–77.0)
Platelets: 292 10*3/uL (ref 150.0–400.0)
RBC: 5.16 Mil/uL — ABNORMAL HIGH (ref 3.87–5.11)
RDW: 13.3 % (ref 11.5–15.5)
WBC: 11.3 10*3/uL — ABNORMAL HIGH (ref 4.0–10.5)

## 2018-04-02 LAB — BASIC METABOLIC PANEL
BUN: 20 mg/dL (ref 6–23)
CO2: 28 mEq/L (ref 19–32)
Calcium: 10.2 mg/dL (ref 8.4–10.5)
Chloride: 102 mEq/L (ref 96–112)
Creatinine, Ser: 1.1 mg/dL (ref 0.40–1.20)
GFR: 48.63 mL/min — ABNORMAL LOW (ref >60.00)
Glucose, Bld: 131 mg/dL — ABNORMAL HIGH (ref 70–99)
Potassium: 4.1 mEq/L (ref 3.5–5.1)
Sodium: 139 mEq/L (ref 135–145)

## 2018-04-02 LAB — HEPATIC FUNCTION PANEL
ALT: 25 U/L (ref 0–35)
AST: 36 U/L (ref 0–37)
Albumin: 4.5 g/dL (ref 3.5–5.2)
Alkaline Phosphatase: 81 U/L (ref 39–117)
Bilirubin, Direct: 0.2 mg/dL (ref 0.0–0.3)
Total Bilirubin: 1 mg/dL (ref 0.2–1.2)
Total Protein: 7.6 g/dL (ref 6.0–8.3)

## 2018-04-02 LAB — LIPASE: Lipase: 36 U/L (ref 11.0–59.0)

## 2018-04-02 MED ORDER — ONDANSETRON 4 MG PO TBDP: 4.0000 mg | ORAL_TABLET | Freq: Three times a day (TID) | ORAL | 0 refills | Status: DC | PRN

## 2018-04-02 NOTE — Progress Notes (Signed)
Subjective  CC:  Chief Complaint  Patient presents with  . Fatigue    Started 03/29/2018, has been exposed to Hepatitis    HPI: Susan Hunt is a 73 y.o. female who presents to the office today to address the problems listed above in the chief complaint.  Susan Hunt presents due to worsening fatigue over the last several days and itching skin.  Her husband is been diagnosed with acute hepatitis a.  She has skin itching as well.  She denies jaundice but is noticing some upper abdominal cramping and lack of appetite.  She is concerned that she is contracted hepatitis A as well.  Her exposure besides her husband was a visitor in their home about 6 weeks ago.  He was hospitalized due to complications from hepatitis A.  He is now doing well.  She was a former [employer] scientist and believes that she is had hepatitis A vaccination but she is not certain.  Review of systems is negative for fevers, chills, blood in the stool, acholic school, URI symptoms, shortness of breath.  Assessment  1. Exposure to hepatitis   2. RUQ pain   3. Nausea      Plan   Hepatitis exposure with right upper quadrant pain, nausea and fatigue: Symptom complex concerning for acute hepatitis.  Check lab work today.  Further evaluation pending lab results.  Zofran for nausea.  Good hydration and rest recommended.  May warrant active and/or passive immunization if she has not contracted the virus yet.  Await lab work.  Follow up: Return in about 2 weeks (around 04/16/2018) for recheck.  Visit date not found  Orders Placed This Encounter  Procedures  . CBC with Differential/Platelet  . Hepatic function panel  . Basic metabolic panel  . Lipase  . Hepatitis, Acute   Meds ordered this encounter  Medications  . ondansetron (ZOFRAN ODT) 4 MG disintegrating tablet    Sig: Take 1 tablet (4 mg total) by mouth every 8 (eight) hours as needed for nausea or vomiting.    Dispense:  20 tablet    Refill:  0      I reviewed the  patients updated PMH, FH, and SocHx.    Patient Active Problem List   Diagnosis Date Noted  . Panlobular emphysema ([place]) 06/17/2014    Priority: High  . Major depression, chronic 01/28/2014    Priority: High  . Obesity (BMI 30.0-34.9) 07/15/2013    Priority: High  . Acquired hypothyroidism 04/11/2007    Priority: High  . Mixed hyperlipidemia 04/11/2007    Priority: High  . Essential hypertension 04/11/2007    Priority: High  . Osteopenia of femoral neck 05/24/2016    Priority: Medium  . RENAL CALCULUS, HX OF 04/11/2007    Priority: Medium  . Postmenopausal symptoms 02/14/2017    Priority: Low  . Grade II hemorrhoids 05/03/2016    Priority: Low  . Snoring 05/06/2014    Priority: Low  . Allergic rhinitis 12/18/2009    Priority: Low  . Rosacea 04/11/2007    Priority: Low  . URINARY INCONTINENCE 04/11/2007    Priority: Low  . ACE-inhibitor cough 10/02/2017  . Incontinence of feces 08/14/2017   Current Meds  Medication Sig  . albuterol (PROVENTIL HFA;VENTOLIN HFA) 108 (90 Base) MCG/ACT inhaler Inhale 2 puffs into the lungs every 4 (four) hours as needed for wheezing or shortness of breath.  Marland Kitchen amLODipine (NORVASC) 10 MG tablet Take 1 tablet (10 mg total) by mouth daily.  Marland Kitchen aspirin  EC 81 MG tablet Take 1 tablet (81 mg total) by mouth daily.  . cloNIDine (CATAPRES) 0.1 MG tablet Take 1 tablet (0.1 mg total) by mouth at bedtime as needed (sleep).  Marland Kitchen levothyroxine (SYNTHROID, LEVOTHROID) 137 MCG tablet Take 1 tablet (137 mcg total) by mouth daily before breakfast.  . loratadine (CLARITIN) 10 MG tablet Take 10 mg by mouth daily.  . metoprolol tartrate (LOPRESSOR) 50 MG tablet Take 1 tablet (50 mg total) by mouth 2 (two) times daily.  . montelukast (SINGULAIR) 10 MG tablet Take 1 tablet (10 mg total) by mouth at bedtime.  Marland Kitchen omeprazole (PRILOSEC) 20 MG capsule Take 20 mg by mouth as needed.   . rosuvastatin (CRESTOR) 10 MG tablet TAKE ONE TABLET BY MOUTH DAILY  . Tiotropium  Bromide-Olodaterol (STIOLTO RESPIMAT) 2.5-2.5 MCG/ACT AERS Inhale 2 puffs into the lungs daily.  Marland Kitchen triamterene-hydrochlorothiazide (MAXZIDE-25) 37.5-25 MG tablet Take 1 tablet by mouth daily.  Marland Kitchen venlafaxine XR (EFFEXOR-XR) 150 MG 24 hr capsule Take 1 capsule (150 mg total) by mouth daily.    Allergies: Patient is allergic to ace inhibitors; clarithromycin; codeine; and telmisartan. Family History: Patient family history includes Alcohol abuse in her father; Diabetes in her maternal grandmother; Drug abuse in her son; Heart disease in her mother; Hypertension in her mother; Osteoporosis in her mother; Stroke in her mother. Social History:  Patient  reports that she has never smoked. She has never used smokeless tobacco. She reports current alcohol use of about 1.0 standard drinks of alcohol per week. She reports that she does not use drugs.  Review of Systems: Constitutional: Negative for fever malaise or anorexia Cardiovascular: negative for chest pain Respiratory: negative for SOB or persistent cough Gastrointestinal: negative for abdominal pain  Objective  Vitals: BP (!) 142/78   Pulse 72   Temp 98.5 F (36.9 C) (Oral)   Resp 16   Ht _0  (1.727 m)   Wt 207 lb (93.9 kg)   SpO2 97%   BMI 31.47 kg/m  General: no acute distress , A&Ox3 HEENT: PEERL, conjunctiva normal, Oropharynx moist,neck is supple Cardiovascular:  RRR without murmur or gallop.  Respiratory:  Good breath sounds bilaterally, CTAB with normal respiratory effort Gastrointestinal: soft, flat abdomen, normal active bowel sounds, no palpable masses, no hepatosplenomegaly, no appreciated hernias, minimal right upper quadrant tenderness is present without rebound or guarding Skin:  Warm, no rashes, no jaundice     Commons side effects, risks, benefits, and alternatives for medications and treatment plan prescribed today were discussed, and the patient expressed understanding of the given instructions. Patient is  instructed to call or message via MyChart if he/she has any questions or concerns regarding our treatment plan. No barriers to understanding were identified. We discussed Red Flag symptoms and signs in detail. Patient expressed understanding regarding what to do in case of urgent or emergency type symptoms.   Medication list was reconciled, printed and provided to the patient in AVS. Patient instructions and summary information was reviewed with the patient as documented in the AVS. This note was prepared with assistance of Dragon voice recognition software. Occasional wrong-word or sound-a-like substitutions may have occurred due to the inherent limitations of voice recognition software

## 2018-04-02 NOTE — Patient Instructions (Signed)
Please follow up if symptoms do not improve or as needed.   I will let you know about your blood test results and follow up in the next day or two.   Take the nausea medication as needed.  Avoid tylenol and alcohol.   Monitor for fever or jaundice.  Keep hydrated.

## 2018-04-03 LAB — HEPATITIS PANEL, ACUTE
Hep A IgM: NONREACTIVE
Hep B C IgM: NONREACTIVE
Hepatitis B Surface Ag: NONREACTIVE
Hepatitis C Ab: NONREACTIVE
SIGNAL TO CUT-OFF: 0.03 (ref <1.00)

## 2018-04-04 ENCOUNTER — Other Ambulatory Visit: Payer: Self-pay | Admitting: *Deleted

## 2018-04-04 ENCOUNTER — Telehealth: Payer: Self-pay | Admitting: Family Medicine

## 2018-04-04 MED ORDER — OMEPRAZOLE 20 MG PO CPDR: 20.0000 mg | DELAYED_RELEASE_CAPSULE | Freq: Every day | ORAL | 0 refills | Status: DC

## 2018-04-04 NOTE — Telephone Encounter (Signed)
Copied from [place] [phone]. Topic: Quick Communication - Rx Refill/Question >> Apr 04, 2018 10:52 AM Marin Olp L wrote: Medication: omeprazole (PRILOSEC) 20 MG capsule (never prescribed by Jonni Sanger but requested this during office visit  on 04/02/2018)  Has the patient contacted their pharmacy? Yes.   (Agent: If no, request that the patient contact the pharmacy for the refill.) (Agent: If yes, when and what did the pharmacy advise?)  Preferred Pharmacy (with phone number or street name): [facility], [place] [address] Phone: [phone] Fax: [phone]  Agent: Please be advised that RX refills may take up to 3 business days. We ask that you follow-up with your pharmacy.

## 2018-04-04 NOTE — Telephone Encounter (Signed)
Refill has been sent.  °

## 2018-04-06 ENCOUNTER — Ambulatory Visit (INDEPENDENT_AMBULATORY_CARE_PROVIDER_SITE_OTHER): Payer: Medicare Other | Admitting: *Deleted

## 2018-04-06 DIAGNOSIS — Z23 Encounter for immunization: Secondary | ICD-10-CM

## 2018-04-06 DIAGNOSIS — Z205 Contact with and (suspected) exposure to viral hepatitis: Secondary | ICD-10-CM | POA: Diagnosis not present

## 2018-04-06 NOTE — Addendum Note (Signed)
Addended by: Layla Barter on: 04/06/2018 04:03 PM   Modules accepted: Orders

## 2018-04-06 NOTE — Progress Notes (Signed)
Susan Hunt is a 73 y.o. female presents to the office today for Hepatitis A injections, per physician's orders. Original order: Hepatitis A vaccine Hepatitis A Vaccine/Havrix (med), 39m (dose),  IM (route) was administered Left Deltoid (location) today. Patient tolerated injection.   TLayla Barter RMA

## 2018-05-08 ENCOUNTER — Other Ambulatory Visit: Payer: Self-pay | Admitting: Family Medicine

## 2018-07-16 ENCOUNTER — Telehealth: Payer: Self-pay | Admitting: Family Medicine

## 2018-07-16 MED ORDER — OMEPRAZOLE 20 MG PO CPDR: 20.0000 mg | DELAYED_RELEASE_CAPSULE | Freq: Every day | ORAL | 0 refills | Status: DC

## 2018-07-16 NOTE — Telephone Encounter (Signed)
Copied from [place] [phone] [id number]. Topic: Quick Communication - Rx Refill/Question >> Jul 16, 2018  1:47 PM Percell Belt A wrote: Medication: omeprazole (PRILOSEC) 20 MG capsule [[id number]]  Has the patient contacted their pharmacy? Yes -pt stated he requested this last week with pharmacy and they were suppose to fax it to [facility]  (Agent: If no, request that the patient contact the pharmacy for the refill.) (Agent: If yes, when and what did the pharmacy advise?)  Preferred Pharmacy (with phone number or street name): [facility], [place] [phone] (Phone)   Agent: Please be advised that RX refills may take up to 3 business days. We ask that you follow-up with your pharmacy.

## 2018-07-16 NOTE — Telephone Encounter (Signed)
Husband is aware RX sent and pt needs to call to schedule f/u on HTN visit

## 2018-07-16 NOTE — Telephone Encounter (Signed)
See note °

## 2018-08-15 ENCOUNTER — Other Ambulatory Visit: Payer: Self-pay

## 2018-08-15 ENCOUNTER — Encounter: Payer: Self-pay | Admitting: Family Medicine

## 2018-08-15 ENCOUNTER — Ambulatory Visit (INDEPENDENT_AMBULATORY_CARE_PROVIDER_SITE_OTHER): Payer: Medicare Other | Admitting: Family Medicine

## 2018-08-15 VITALS — BP 115/68 | HR 67 | Temp 98.1°F | Ht 68.0 in | Wt 212.0 lb

## 2018-08-15 DIAGNOSIS — R358 Other polyuria: Secondary | ICD-10-CM | POA: Diagnosis not present

## 2018-08-15 DIAGNOSIS — K580 Irritable bowel syndrome with diarrhea: Secondary | ICD-10-CM | POA: Diagnosis not present

## 2018-08-15 DIAGNOSIS — F329 Major depressive disorder, single episode, unspecified: Secondary | ICD-10-CM

## 2018-08-15 DIAGNOSIS — N3000 Acute cystitis without hematuria: Secondary | ICD-10-CM

## 2018-08-15 DIAGNOSIS — R300 Dysuria: Secondary | ICD-10-CM | POA: Diagnosis not present

## 2018-08-15 DIAGNOSIS — R3589 Other polyuria: Secondary | ICD-10-CM

## 2018-08-15 LAB — POC URINALSYSI DIPSTICK (AUTOMATED)
Bilirubin, UA: NEGATIVE
Blood, UA: POSITIVE
Glucose, UA: NEGATIVE
Ketones, UA: NEGATIVE
Leukocytes, UA: NEGATIVE
Nitrite, UA: POSITIVE
Protein, UA: POSITIVE — AB
Spec Grav, UA: >=1.03 — AB (ref 1.010–1.025)
Urobilinogen, UA: 0.2 E.U./dL
pH, UA: 6 (ref 5.0–8.0)

## 2018-08-15 LAB — POCT GLYCOSYLATED HEMOGLOBIN (HGB A1C): Hemoglobin A1C: 5.9 % — AB (ref 4.0–5.6)

## 2018-08-15 MED ORDER — NITROFURANTOIN MONOHYD MACRO 100 MG PO CAPS: 100.0000 mg | ORAL_CAPSULE | Freq: Two times a day (BID) | ORAL | 0 refills | Status: DC

## 2018-08-15 MED ORDER — HYOSCYAMINE SULFATE SL 0.125 MG SL SUBL: 0.1250 mg | SUBLINGUAL_TABLET | Freq: Three times a day (TID) | SUBLINGUAL | 2 refills | Status: DC

## 2018-08-15 NOTE — Patient Instructions (Signed)
Recheck in 6 weeks.

## 2018-08-15 NOTE — Progress Notes (Signed)
Subjective   CC:  Chief Complaint  Patient presents with   Urinary Tract Infection    HPI: Susan Hunt is a 73 y.o. female who presents to the office today to address the problems listed above in the chief complaint.  Patient reports feeling like she has had bladder infection symptoms on and off for the last several weeks.  She describes suprapubic tenderness with occasional polyuria.  No severe dysuria.  No gross hematuria.  No flank pain..  She has sensation of increased urinary pressure.  She denies fevers flank pain nausea vomiting or gross hematuria.  Symptoms have been present for several hours to days.  She denies history of interstitial cystitis.  She denies vaginal symptoms including vaginal discharge or pelvic pain.   Also complains of intermittent diarrhea and constipation associated with lower abdominal pain and cramping.  She is never been formally diagnosed with IBS but feels like she has lived with it for many years.  She has seen GI recently for fecal incontinence and is undergoing a urogynecologic evaluation currently for further work-up of this.  She denies blood in the stools or mucus in the stool.  No fevers or chills.  No chronic abdominal pain.  Depression with increased stressors: Her husband has been chronically sick from hepatitis A for the last 6 months.  His mood is changed significantly which has affected her.  She thinks that she is handling things well but does admit to increased stress.  This could be causing a flare of her IBS.  Also concerned that she has diabetes because she has polyuria.  Assessment  1. Dysuria   2. Acute cystitis without hematuria   3. Polyuria   4. Irritable bowel syndrome with diarrhea   5. Major depression, chronic      Plan   We will treat for acute cystitis.  Await urine culture.  Increase fluids.  Reassured, A1c is just above normal.  Recommend low sugar diet and weight loss.  Depression is well controlled but likely  having a stress reaction.  Counseling done.  Continue Effexor  IBS with diarrhea: Educated.  Trial of antispasmodic.  Stop fiber supplements.  Follow-up if not improving.  Follow up: Return in about 6 weeks (around 09/26/2018) for recheck.  Orders Placed This Encounter  Procedures   Urine Culture   POCT Urinalysis Dipstick (Automated)   POCT glycosylated hemoglobin (Hb A1C)   Meds ordered this encounter  Medications   nitrofurantoin, macrocrystal-monohydrate, (MACROBID) 100 MG capsule    Sig: Take 1 capsule (100 mg total) by mouth 2 (two) times daily.    Dispense:  10 capsule    Refill:  0   Hyoscyamine Sulfate SL (LEVSIN/SL) 0.125 MG SUBL    Sig: Place 0.125 mg under the tongue 4 (four) times daily -  before meals and at bedtime. As needed for abdominal cramping    Dispense:  60 each    Refill:  2      I reviewed the patients updated PMH, FH, and SocHx.    Patient Active Problem List   Diagnosis Date Noted   Panlobular emphysema ([place]) 06/17/2014    Priority: High   Major depression, chronic 01/28/2014    Priority: High   Obesity (BMI 30.0-34.9) 07/15/2013    Priority: High   Acquired hypothyroidism 04/11/2007    Priority: High   Mixed hyperlipidemia 04/11/2007    Priority: High   Essential hypertension 04/11/2007    Priority: High   Osteopenia of femoral  neck 05/24/2016    Priority: Medium   RENAL CALCULUS, HX OF 04/11/2007    Priority: Medium   Postmenopausal symptoms 02/14/2017    Priority: Low   Grade II hemorrhoids 05/03/2016    Priority: Low   Snoring 05/06/2014    Priority: Low   Allergic rhinitis 12/18/2009    Priority: Low   Rosacea 04/11/2007    Priority: Low   URINARY INCONTINENCE 04/11/2007    Priority: Low   ACE-inhibitor cough 10/02/2017   Incontinence of feces 08/14/2017   Current Meds  Medication Sig   albuterol (PROVENTIL HFA;VENTOLIN HFA) 108 (90 Base) MCG/ACT inhaler Inhale 2 puffs into the lungs every 4 (four) hours  as needed for wheezing or shortness of breath.   amLODipine (NORVASC) 10 MG tablet Take 1 tablet (10 mg total) by mouth daily.   cloNIDine (CATAPRES) 0.1 MG tablet Take 1 tablet (0.1 mg total) by mouth at bedtime as needed (sleep).   fluticasone (FLONASE) 50 MCG/ACT nasal spray Place 2 sprays into the nose as needed.    levothyroxine (SYNTHROID, LEVOTHROID) 137 MCG tablet Take 1 tablet (137 mcg total) by mouth daily before breakfast.   loratadine (CLARITIN) 10 MG tablet Take 10 mg by mouth daily.   metoprolol tartrate (LOPRESSOR) 50 MG tablet Take 1 tablet (50 mg total) by mouth 2 (two) times daily.   montelukast (SINGULAIR) 10 MG tablet Take 1 tablet (10 mg total) by mouth at bedtime.   omeprazole (PRILOSEC) 20 MG capsule Take 1 capsule (20 mg total) by mouth daily.   ondansetron (ZOFRAN ODT) 4 MG disintegrating tablet Take 1 tablet (4 mg total) by mouth every 8 (eight) hours as needed for nausea or vomiting.   Tiotropium Bromide-Olodaterol (STIOLTO RESPIMAT) 2.5-2.5 MCG/ACT AERS Inhale 2 puffs into the lungs daily.   triamterene-hydrochlorothiazide (MAXZIDE-25) 37.5-25 MG tablet Take 1 tablet by mouth daily.   venlafaxine XR (EFFEXOR-XR) 150 MG 24 hr capsule Take 1 capsule (150 mg total) by mouth daily.    Review of Systems: Cardiovascular: negative for chest pain Respiratory: negative for SOB or persistent cough Gastrointestinal: negative for abdominal pain Constitutional: Negative for fever malaise or anorexia  Objective  Vitals: BP 115/68 (BP Location: Left Arm, Patient Position: Sitting, Cuff Size: Normal)    Pulse 67    Temp 98.1 F (36.7 C) (Oral)    Ht _0  (1.727 m)    Wt 212 lb (96.2 kg)    SpO2 96%    BMI 32.23 kg/m  General: no acute distress  Psych:  Alert and oriented, normal mood and affect Cardiovascular:  RRR without murmur or gallop. no peripheral edema Respiratory:  Good breath sounds bilaterally, CTAB with normal respiratory effort Gastrointestinal:  soft, flat abdomen, normal active bowel sounds, no palpable masses, no hepatosplenomegaly, no appreciated hernias, NO CVAT, mild suprapubic ttp w/o rebound or guarding Skin:  Warm, no rashes Neurologic:   Mental status is normal. normal gait Office Visit on 08/15/2018  Component Date Value Ref Range Status   Color, UA 08/15/2018 Yellow   Final   Clarity, UA 08/15/2018 Cloudy   Final   Glucose, UA 08/15/2018 Negative  Negative Final   Bilirubin, UA 08/15/2018 Negative   Final   Ketones, UA 08/15/2018 Negative   Final   Spec Grav, UA 08/15/2018 >=1.030* 1.010 - 1.025 Final   Blood, UA 08/15/2018 Positive   Final   pH, UA 08/15/2018 6.0  5.0 - 8.0 Final   Protein, UA 08/15/2018 Positive* Negative Final   Urobilinogen,  UA 08/15/2018 0.2  0.2 or 1.0 E.U./dL Final   Nitrite, UA 08/15/2018 Positive   Final   Leukocytes, UA 08/15/2018 Negative  Negative Final   Hemoglobin A1C 08/15/2018 5.9* 4.0 - 5.6 % Final    Commons side effects, risks, benefits, and alternatives for medications and treatment plan prescribed today were discussed, and the patient expressed understanding of the given instructions. Patient is instructed to call or message via MyChart if he/she has any questions or concerns regarding our treatment plan. No barriers to understanding were identified. We discussed Red Flag symptoms and signs in detail. Patient expressed understanding regarding what to do in case of urgent or emergency type symptoms.   Medication list was reconciled, printed and provided to the patient in AVS. Patient instructions and summary information was reviewed with the patient as documented in the AVS. This note was prepared with assistance of Dragon voice recognition software. Occasional wrong-word or sound-a-like substitutions may have occurred due to the inherent limitations of voice recognition software

## 2018-08-17 LAB — URINE CULTURE
MICRO NUMBER:: 533124
SPECIMEN QUALITY:: ADEQUATE

## 2018-08-24 ENCOUNTER — Other Ambulatory Visit: Payer: Self-pay | Admitting: *Deleted

## 2018-08-24 MED ORDER — MONTELUKAST SODIUM 10 MG PO TABS: 10.0000 mg | ORAL_TABLET | Freq: Every day | ORAL | 0 refills | Status: DC

## 2018-08-24 MED ORDER — LEVOTHYROXINE SODIUM 137 MCG PO TABS: 137.0000 ug | ORAL_TABLET | Freq: Every day | ORAL | 0 refills | Status: DC

## 2018-08-24 MED ORDER — VENLAFAXINE HCL ER 150 MG PO CP24: 150.0000 mg | ORAL_CAPSULE | Freq: Every day | ORAL | 0 refills | Status: DC

## 2018-09-27 ENCOUNTER — Ambulatory Visit: Payer: Medicare Other | Admitting: Family Medicine

## 2018-09-28 ENCOUNTER — Other Ambulatory Visit: Payer: Self-pay | Admitting: *Deleted

## 2018-09-28 DIAGNOSIS — I10 Essential (primary) hypertension: Secondary | ICD-10-CM

## 2018-09-28 MED ORDER — METOPROLOL TARTRATE 50 MG PO TABS: 50.0000 mg | ORAL_TABLET | Freq: Two times a day (BID) | ORAL | 0 refills | Status: DC

## 2018-10-09 ENCOUNTER — Other Ambulatory Visit: Payer: Self-pay | Admitting: Family Medicine

## 2018-10-11 ENCOUNTER — Other Ambulatory Visit: Payer: Self-pay | Admitting: Family Medicine

## 2018-11-21 ENCOUNTER — Other Ambulatory Visit: Payer: Self-pay | Admitting: Family Medicine

## 2018-11-28 ENCOUNTER — Other Ambulatory Visit: Payer: Self-pay | Admitting: Family Medicine

## 2018-12-05 ENCOUNTER — Other Ambulatory Visit: Payer: Self-pay | Admitting: Family Medicine

## 2018-12-05 ENCOUNTER — Encounter: Payer: Self-pay | Admitting: Family Medicine

## 2018-12-05 ENCOUNTER — Other Ambulatory Visit: Payer: Self-pay

## 2018-12-05 ENCOUNTER — Ambulatory Visit (INDEPENDENT_AMBULATORY_CARE_PROVIDER_SITE_OTHER): Payer: Medicare Other | Admitting: Family Medicine

## 2018-12-05 ENCOUNTER — Ambulatory Visit (HOSPITAL_COMMUNITY): Admission: RE | Admit: 2018-12-05 | Payer: Medicare Other | Source: Ambulatory Visit

## 2018-12-05 VITALS — BP 128/96 | HR 61 | Temp 97.9°F | Resp 16 | Ht 68.0 in | Wt 206.0 lb

## 2018-12-05 DIAGNOSIS — R1031 Right lower quadrant pain: Secondary | ICD-10-CM

## 2018-12-05 DIAGNOSIS — H8113 Benign paroxysmal vertigo, bilateral: Secondary | ICD-10-CM | POA: Diagnosis not present

## 2018-12-05 LAB — CBC WITH DIFFERENTIAL/PLATELET
Basophils Absolute: 0.1 10*3/uL (ref 0.0–0.1)
Basophils Relative: 0.8 % (ref 0.0–3.0)
Eosinophils Absolute: 0.4 10*3/uL (ref 0.0–0.7)
Eosinophils Relative: 4.3 % (ref 0.0–5.0)
HCT: 42.9 % (ref 36.0–46.0)
Hemoglobin: 14.2 g/dL (ref 12.0–15.0)
Lymphocytes Relative: 31.3 % (ref 12.0–46.0)
Lymphs Abs: 3.1 10*3/uL (ref 0.7–4.0)
MCHC: 33 g/dL (ref 30.0–36.0)
MCV: 91.9 fl (ref 78.0–100.0)
Monocytes Absolute: 1 10*3/uL (ref 0.1–1.0)
Monocytes Relative: 9.7 % (ref 3.0–12.0)
Neutro Abs: 5.3 10*3/uL (ref 1.4–7.7)
Neutrophils Relative %: 53.9 % (ref 43.0–77.0)
Platelets: 345 10*3/uL (ref 150.0–400.0)
RBC: 4.67 Mil/uL (ref 3.87–5.11)
RDW: 12.9 % (ref 11.5–15.5)
WBC: 9.9 10*3/uL (ref 4.0–10.5)

## 2018-12-05 LAB — COMPREHENSIVE METABOLIC PANEL
ALT: 34 U/L (ref 0–35)
AST: 52 U/L — ABNORMAL HIGH (ref 0–37)
Albumin: 4.2 g/dL (ref 3.5–5.2)
Alkaline Phosphatase: 85 U/L (ref 39–117)
BUN: 16 mg/dL (ref 6–23)
CO2: 28 mEq/L (ref 19–32)
Calcium: 9.8 mg/dL (ref 8.4–10.5)
Chloride: 99 mEq/L (ref 96–112)
Creatinine, Ser: 0.99 mg/dL (ref 0.40–1.20)
GFR: 54.81 mL/min — ABNORMAL LOW (ref >60.00)
Glucose, Bld: 98 mg/dL (ref 70–99)
Potassium: 4.7 mEq/L (ref 3.5–5.1)
Sodium: 135 mEq/L (ref 135–145)
Total Bilirubin: 0.6 mg/dL (ref 0.2–1.2)
Total Protein: 7.6 g/dL (ref 6.0–8.3)

## 2018-12-05 LAB — SEDIMENTATION RATE: Sed Rate: 19 mm/hr (ref 0–30)

## 2018-12-05 MED ORDER — MECLIZINE HCL 25 MG PO TABS: 25.0000 mg | ORAL_TABLET | Freq: Three times a day (TID) | ORAL | 0 refills | Status: DC | PRN

## 2018-12-05 NOTE — Progress Notes (Signed)
Subjective  CC:  Chief Complaint  Patient presents with  . Abdominal Pain    Started about 1 month ago on/off.. RLQ pain    HPI: Susan Hunt is a 73 y.o. female who presents to the office today to address the problems listed above in the chief complaint.  73 year old presents with intermittent moderate to severe right lower quadrant pain.  She reports that about 3 to 4 weeks ago she noted acute onset of right lower quadrant pain described as sharp pain that did not radiate associated with nausea.  She has had several episodes of vomiting.  No fevers.  Of note, she is status post bladder and vaginal surgery at the end of August.  She also has been treated for recurrent UTIs of the last 3 to 4 weeks and currently is on antibiotics.  She is taking Aleve and Tylenol without relief of the pain.  Pain is much worse if she is lying on her back.  It has interfered with sleep.  She also notes intermittent dizziness, worse with movement.  She feels better if she lies still.  She has had chronic vertigo in the past but this feels different.  Worse when she gets up out of bed.  Associated with nausea.  No paresis or headaches.  No diplopia or dysarthria.  She is having normal bowel movements.  Appetite is fair Assessment  1. Right lower quadrant abdominal pain   2. Benign paroxysmal positional vertigo due to bilateral vestibular disorder      Plan   Right lower quadrant pain: Exam worrisome for localized infection like appendicitis, diverticulitis, postoperative abscess, obstruction or other.  Recommend lab work, stat CT and further treatment once results are back.  Patient has a nonacute abdomen, if pain worsens she is to go to the emergency room.  BPPV: Education given.  Start meclizine.  Follow up: No follow-ups on file.  12/21/2018  Orders Placed This Encounter  Procedures  . CT Abdomen Pelvis W Contrast  . DG Abd 1 View  . CBC with Differential/Platelet  . Comprehensive metabolic  panel  . Sedimentation rate   Meds ordered this encounter  Medications  . meclizine (ANTIVERT) 25 MG tablet    Sig: Take 1 tablet (25 mg total) by mouth 3 (three) times daily as needed for dizziness.    Dispense:  30 tablet    Refill:  0      I reviewed the patients updated PMH, FH, and SocHx.    Patient Active Problem List   Diagnosis Date Noted  . Panlobular emphysema ([place]) 06/17/2014    Priority: High  . Major depression, chronic 01/28/2014    Priority: High  . Obesity (BMI 30.0-34.9) 07/15/2013    Priority: High  . Acquired hypothyroidism 04/11/2007    Priority: High  . Mixed hyperlipidemia 04/11/2007    Priority: High  . Essential hypertension 04/11/2007    Priority: High  . Osteopenia of femoral neck 05/24/2016    Priority: Medium  . RENAL CALCULUS, HX OF 04/11/2007    Priority: Medium  . Postmenopausal symptoms 02/14/2017    Priority: Low  . Grade II hemorrhoids 05/03/2016    Priority: Low  . Snoring 05/06/2014    Priority: Low  . Allergic rhinitis 12/18/2009    Priority: Low  . Rosacea 04/11/2007    Priority: Low  . URINARY INCONTINENCE 04/11/2007    Priority: Low  . ACE-inhibitor cough 10/02/2017  . Incontinence of feces 08/14/2017   Current Meds  Medication Sig  . albuterol (PROVENTIL HFA;VENTOLIN HFA) 108 (90 Base) MCG/ACT inhaler Inhale 2 puffs into the lungs every 4 (four) hours as needed for wheezing or shortness of breath.  . cloNIDine (CATAPRES) 0.1 MG tablet Take 1 tablet (0.1 mg total) by mouth at bedtime as needed (sleep).  . Hyoscyamine Sulfate SL (LEVSIN/SL) 0.125 MG SUBL Place 0.125 mg under the tongue 4 (four) times daily -  before meals and at bedtime. As needed for abdominal cramping  . levothyroxine (SYNTHROID) 137 MCG tablet TAKE ONE TABLET BY MOUTH EVERY MORNING BEFORE BREAKFAST  . metoprolol tartrate (LOPRESSOR) 50 MG tablet Take 1 tablet (50 mg total) by mouth 2 (two) times daily.  . montelukast (SINGULAIR) 10 MG tablet TAKE ONE  TABLET BY MOUTH EVERY NIGHT AT BEDTIME  . nitrofurantoin, macrocrystal-monohydrate, (MACROBID) 100 MG capsule Take 1 capsule (100 mg total) by mouth 2 (two) times daily.  Marland Kitchen omeprazole (PRILOSEC) 20 MG capsule TAKE ONE CAPSULE BY MOUTH DAILY  . ondansetron (ZOFRAN ODT) 4 MG disintegrating tablet Take 1 tablet (4 mg total) by mouth every 8 (eight) hours as needed for nausea or vomiting.  . triamterene-hydrochlorothiazide (MAXZIDE-25) 37.5-25 MG tablet TAKE ONE TABLET BY MOUTH DAILY  . venlafaxine XR (EFFEXOR-XR) 150 MG 24 hr capsule Take 1 capsule (150 mg total) by mouth daily.  . [DISCONTINUED] loratadine (CLARITIN) 10 MG tablet Take 10 mg by mouth daily.    Allergies: Patient is allergic to ace inhibitors; clarithromycin; codeine; and telmisartan. Family History: Patient family history includes Alcohol abuse in her father; Diabetes in her maternal grandmother; Drug abuse in her son; Heart disease in her mother; Hypertension in her mother; Osteoporosis in her mother; Stroke in her mother. Social History:  Patient  reports that she has never smoked. She has never used smokeless tobacco. She reports current alcohol use of about 1.0 standard drinks of alcohol per week. She reports that she does not use drugs.  Review of Systems: Constitutional: Negative for fever malaise or anorexia Cardiovascular: negative for chest pain Respiratory: negative for SOB or persistent cough Gastrointestinal: negative for abdominal pain  Objective  Vitals: BP (!) 128/96   Pulse 61   Temp 97.9 F (36.6 C) (Tympanic)   Resp 16   Ht _0  (1.727 m)   Wt 206 lb (93.4 kg)   SpO2 97%   BMI 31.32 kg/m  General: Sick appearing but appears mildly uncomfortable with movement, A&Ox3 HEENT: PEERL, conjunctiva normal, anicteric oropharynx moist,neck is supple Cardiovascular:  RRR without murmur or gallop.  Respiratory:  Good breath sounds bilaterally, CTAB with normal respiratory effort Normal exam: Hyperactive bowel  sounds, right lower quadrant tenderness without rebound or guarding present, no masses palpated no suprapubic tenderness Skin:  Warm, no rashes Neuro: Nonfocal neuro exam, positive vertigo with head movement, reproduces symptoms    Commons side effects, risks, benefits, and alternatives for medications and treatment plan prescribed today were discussed, and the patient expressed understanding of the given instructions. Patient is instructed to call or message via MyChart if he/she has any questions or concerns regarding our treatment plan. No barriers to understanding were identified. We discussed Red Flag symptoms and signs in detail. Patient expressed understanding regarding what to do in case of urgent or emergency type symptoms.   Medication list was reconciled, printed and provided to the patient in AVS. Patient instructions and summary information was reviewed with the patient as documented in the AVS. This note was prepared with assistance of Dragon voice recognition software.  Occasional wrong-word or sound-a-like substitutions may have occurred due to the inherent limitations of voice recognition software

## 2018-12-05 NOTE — Patient Instructions (Signed)
Please follow up if symptoms do not improve or as needed.   I want a CT scan of your abdomen and pelvis.  Will call you with the results.  Lab work done today as well.   Use the meclizine for your dizziness as needed.

## 2018-12-06 ENCOUNTER — Ambulatory Visit: Payer: Medicare Other | Admitting: Family Medicine

## 2018-12-06 ENCOUNTER — Ambulatory Visit (HOSPITAL_COMMUNITY)
Admission: RE | Admit: 2018-12-06 | Discharge: 2018-12-06 | Disposition: A | Discharge status: Home / Self Care | Payer: Medicare Other | Source: Ambulatory Visit | Attending: Family Medicine | Admitting: Family Medicine

## 2018-12-06 DIAGNOSIS — R1031 Right lower quadrant pain: Secondary | ICD-10-CM

## 2018-12-06 MED ORDER — IOHEXOL 300 MG/ML  SOLN
100.0000 mL | Freq: Once | INTRAMUSCULAR | Status: AC | PRN
  Administered 2018-12-06: 100 mL via INTRAVENOUS

## 2018-12-21 ENCOUNTER — Encounter: Payer: Medicare Other | Admitting: Family Medicine

## 2018-12-23 ENCOUNTER — Other Ambulatory Visit: Payer: Self-pay | Admitting: Family Medicine

## 2018-12-23 DIAGNOSIS — I10 Essential (primary) hypertension: Secondary | ICD-10-CM

## 2019-01-09 ENCOUNTER — Other Ambulatory Visit: Payer: Self-pay | Admitting: Family Medicine

## 2019-02-25 DIAGNOSIS — L570 Actinic keratosis: Secondary | ICD-10-CM | POA: Diagnosis not present

## 2019-03-25 ENCOUNTER — Other Ambulatory Visit: Payer: Self-pay

## 2019-03-26 ENCOUNTER — Ambulatory Visit (INDEPENDENT_AMBULATORY_CARE_PROVIDER_SITE_OTHER): Payer: Medicare Other | Admitting: Family Medicine

## 2019-03-26 ENCOUNTER — Encounter: Payer: Self-pay | Admitting: Family Medicine

## 2019-03-26 VITALS — BP 170/82 | HR 62 | Temp 97.5°F | Ht 68.0 in | Wt 214.6 lb

## 2019-03-26 DIAGNOSIS — Z1231 Encounter for screening mammogram for malignant neoplasm of breast: Secondary | ICD-10-CM | POA: Diagnosis not present

## 2019-03-26 DIAGNOSIS — F329 Major depressive disorder, single episode, unspecified: Secondary | ICD-10-CM

## 2019-03-26 DIAGNOSIS — R0600 Dyspnea, unspecified: Secondary | ICD-10-CM | POA: Diagnosis not present

## 2019-03-26 DIAGNOSIS — D126 Benign neoplasm of colon, unspecified: Secondary | ICD-10-CM

## 2019-03-26 DIAGNOSIS — E2839 Other primary ovarian failure: Secondary | ICD-10-CM

## 2019-03-26 DIAGNOSIS — R300 Dysuria: Secondary | ICD-10-CM | POA: Diagnosis not present

## 2019-03-26 DIAGNOSIS — K760 Fatty (change of) liver, not elsewhere classified: Secondary | ICD-10-CM | POA: Insufficient documentation

## 2019-03-26 DIAGNOSIS — M85859 Other specified disorders of bone density and structure, unspecified thigh: Secondary | ICD-10-CM

## 2019-03-26 DIAGNOSIS — E782 Mixed hyperlipidemia: Secondary | ICD-10-CM | POA: Diagnosis not present

## 2019-03-26 DIAGNOSIS — Z0000 Encounter for general adult medical examination without abnormal findings: Secondary | ICD-10-CM | POA: Diagnosis not present

## 2019-03-26 DIAGNOSIS — E039 Hypothyroidism, unspecified: Secondary | ICD-10-CM | POA: Diagnosis not present

## 2019-03-26 DIAGNOSIS — R0609 Other forms of dyspnea: Secondary | ICD-10-CM

## 2019-03-26 DIAGNOSIS — I10 Essential (primary) hypertension: Secondary | ICD-10-CM

## 2019-03-26 DIAGNOSIS — J431 Panlobular emphysema: Secondary | ICD-10-CM | POA: Diagnosis not present

## 2019-03-26 DIAGNOSIS — R42 Dizziness and giddiness: Secondary | ICD-10-CM

## 2019-03-26 HISTORY — DX: Benign neoplasm of colon, unspecified: D12.6

## 2019-03-26 LAB — CBC WITH DIFFERENTIAL/PLATELET
Basophils Absolute: 0.1 10*3/uL (ref 0.0–0.1)
Basophils Relative: 0.6 % (ref 0.0–3.0)
Eosinophils Absolute: 0.3 10*3/uL (ref 0.0–0.7)
Eosinophils Relative: 2.7 % (ref 0.0–5.0)
HCT: 43.4 % (ref 36.0–46.0)
Hemoglobin: 14 g/dL (ref 12.0–15.0)
Lymphocytes Relative: 30 % (ref 12.0–46.0)
Lymphs Abs: 3.5 10*3/uL (ref 0.7–4.0)
MCHC: 32.2 g/dL (ref 30.0–36.0)
MCV: 88.5 fl (ref 78.0–100.0)
Monocytes Absolute: 0.9 10*3/uL (ref 0.1–1.0)
Monocytes Relative: 7.9 % (ref 3.0–12.0)
Neutro Abs: 6.9 10*3/uL (ref 1.4–7.7)
Neutrophils Relative %: 58.8 % (ref 43.0–77.0)
Platelets: 299 10*3/uL (ref 150.0–400.0)
RBC: 4.9 Mil/uL (ref 3.87–5.11)
RDW: 14.1 % (ref 11.5–15.5)
WBC: 11.7 10*3/uL — ABNORMAL HIGH (ref 4.0–10.5)

## 2019-03-26 LAB — POCT URINALYSIS DIPSTICK
Bilirubin, UA: NEGATIVE
Blood, UA: NEGATIVE
Glucose, UA: NEGATIVE
Ketones, UA: NEGATIVE
Nitrite, UA: POSITIVE
Protein, UA: NEGATIVE
Spec Grav, UA: 1.015 (ref 1.010–1.025)
Urobilinogen, UA: 0.2 E.U./dL
pH, UA: 6 (ref 5.0–8.0)

## 2019-03-26 LAB — COMPREHENSIVE METABOLIC PANEL
ALT: 39 U/L — ABNORMAL HIGH (ref 0–35)
AST: 45 U/L — ABNORMAL HIGH (ref 0–37)
Albumin: 4.3 g/dL (ref 3.5–5.2)
Alkaline Phosphatase: 85 U/L (ref 39–117)
BUN: 13 mg/dL (ref 6–23)
CO2: 29 mEq/L (ref 19–32)
Calcium: 9.8 mg/dL (ref 8.4–10.5)
Chloride: 103 mEq/L (ref 96–112)
Creatinine, Ser: 1.05 mg/dL (ref 0.40–1.20)
GFR: 51.17 mL/min — ABNORMAL LOW (ref >60.00)
Glucose, Bld: 115 mg/dL — ABNORMAL HIGH (ref 70–99)
Potassium: 4.3 mEq/L (ref 3.5–5.1)
Sodium: 138 mEq/L (ref 135–145)
Total Bilirubin: 0.7 mg/dL (ref 0.2–1.2)
Total Protein: 8.1 g/dL (ref 6.0–8.3)

## 2019-03-26 LAB — LIPID PANEL
Cholesterol: 235 mg/dL — ABNORMAL HIGH (ref 0–200)
HDL: 61.9 mg/dL (ref >39.00)
LDL Cholesterol: 142 mg/dL — ABNORMAL HIGH (ref 0–99)
NonHDL: 173.19
Total CHOL/HDL Ratio: 4
Triglycerides: 156 mg/dL — ABNORMAL HIGH (ref 0.0–149.0)
VLDL: 31.2 mg/dL (ref 0.0–40.0)

## 2019-03-26 LAB — HEMOGLOBIN A1C: Hgb A1c MFr Bld: 7.2 % — ABNORMAL HIGH (ref 4.6–6.5)

## 2019-03-26 LAB — TSH: TSH: 3.15 u[IU]/mL (ref 0.35–4.50)

## 2019-03-26 MED ORDER — STIOLTO RESPIMAT 2.5-2.5 MCG/ACT IN AERS: 2.0000 | INHALATION_SPRAY | Freq: Every day | RESPIRATORY_TRACT | 11 refills | Status: DC

## 2019-03-26 MED ORDER — CIPROFLOXACIN HCL 500 MG PO TABS: 500.0000 mg | ORAL_TABLET | Freq: Two times a day (BID) | ORAL | 0 refills | Status: DC

## 2019-03-26 MED ORDER — TRIAMTERENE-HCTZ 37.5-25 MG PO TABS: 1.0000 | ORAL_TABLET | Freq: Every day | ORAL | 3 refills | Status: DC

## 2019-03-26 MED ORDER — ROSUVASTATIN CALCIUM 10 MG PO TABS: 10.0000 mg | ORAL_TABLET | Freq: Every day | ORAL | 3 refills | Status: DC

## 2019-03-26 MED ORDER — LOSARTAN POTASSIUM 100 MG PO TABS: 100.0000 mg | ORAL_TABLET | Freq: Every day | ORAL | 3 refills | Status: DC

## 2019-03-26 NOTE — Patient Instructions (Addendum)
Please return in 6 weeks for recheck of your breathing while back on your inhaler.  I will release your lab results to you on your MyChart account with further instructions. Please reply with any questions.   I have ordered your mammogram and bone density scan. We should call you to get you scheduled.  I have referred you for vestibular rehab to help with your dizziness.  If you have any questions or concerns, please don't hesitate to send me a message via MyChart or call the office at [phone]. Thank you for visiting with Susan Hunt today! It's our pleasure caring for you.  Please restart your crestor. Please stop metoprolol and start losartan daily for your blood pressure.  Please restart your inhaler Stiolto daily. I have reordered it.

## 2019-03-26 NOTE — Progress Notes (Signed)
Subjective  Chief Complaint  Patient presents with  . Annual Exam    HPI: Susan Hunt is a 74 y.o. female who presents to [facility] today for a Female Wellness Visit. She also has the concerns and/or needs as listed above in the chief complaint. These will be addressed in addition to the Health Maintenance Visit.   Wellness Visit: annual visit with health maintenance review and exam without Pap   HM: due mammogram and dexa.  Patient reports she is been more sedentary and has gained weight.  Mainly due to Covid restrictions.  Immunizations are up-to-date. Chronic disease f/u and/or acute problem visit: (deemed necessary to be done in addition to the wellness visit):  Status post vaginal prolapse and cystocele repair by urogynecology in September and has done very well since.  No more symptomatic urinary incontinence or fecal smearing.  Complains of shortness of breath worsening but intermittent and mostly with short distances of walking.  No associated palpitations, chest pain, chest heaviness, cough, sweats.  She admits she has not been very active and very limited walking.  Medication review shows that she is not taking her Stiolto and has been out for several months.  She is also on a beta-blocker for hypertension.  As noted above, she also has gained weight.  Major depression: Stable.  Still grieving the loss of her son and her drug addiction.  It is been just over a year.  We increased her Effexor at that time and she does feel that has helped.  No adverse effects.  Hypothyroidism on supplementation.  Reports that she takes this daily as instructed.  Energy is low, she complains of fatigue.  No lower extremity swelling or hair changes.  She is seeing dermatology for extremely dry and thin skin.  This in part is due to her chronic smoking history.  Hypertension had been well controlled but mildly elevated today.  She is on a diuretic and beta-blocker.   She has an ACE inhibitor cough history.  She has no known heart disease but has never been evaluated for this.  She does not risk factors.  Osteopenia: Due for bone density repeat.  She should be on calcium and vitamin D  Tubular adenomas on most recent colonoscopy due for repeat surveillance in 2022  Chronic vertigo: Using meclizine intermittently.  Activity still almost daily affected by dizziness with movement or walking.  Complains of foul-smelling urine, cloudy appearance for the last week or so.  Has history of recurrent UTI.  Reports she was at the walk-in clinic about a month ago and was treated with antibiotics.  No dysuria fevers or flank pain.  Assessment  1. Annual physical exam   2. Panlobular emphysema ([place])   3. Major depression, chronic   4. Acquired hypothyroidism   5. Mixed hyperlipidemia   6. Essential hypertension   7. Osteopenia of neck of femur, unspecified laterality   8. Tubular adenoma of colon   9. Chronic vertigo   10. DOE (dyspnea on exertion)   11. Encounter for screening mammogram for breast cancer   12. Hypoestrogenism   13. Hepatic steatosis   14. Dysuria      Plan  Female Wellness Visit:  Age appropriate Health Maintenance and Prevention measures were discussed with patient. Included topics are cancer screening recommendations, ways to keep healthy (see AVS) including dietary and exercise recommendations, regular eye and dental care, use of seat belts, and avoidance of moderate alcohol use and  tobacco use.  Mammogram and DEXA ordered today.  BMI: discussed patient's BMI and encouraged positive lifestyle modifications to help get to or maintain a target BMI.  Recommend weight loss and starting mild exercise program  HM needs and immunizations were addressed and ordered. See below for orders. See HM and immunization section for updates.  Up-to-date.  Routine labs and screening tests ordered including cmp, cbc and lipids where  appropriate.  Discussed recommendations regarding Vit D and calcium supplementation (see AVS)  Chronic disease management visit and/or acute problem visit:  Shortness of breath in a patient with chronic emphysema not currently on inhalers: Recommend restarting Xarelto, stop beta-blocker.  May also be due to deconditioning.  Recheck 4 to 6 weeks.  EKG done today without acute changes.  Hypertension: Not well controlled by today reading: Stop metoprolol due to shortness of breath and emphysema.  Change to losartan.  Continue Dyazide diuretic.  Recheck in 4 weeks.  In medications at that time if needed.  Hypothyroidism and fatigue: Check for anemia.  Recheck thyroid levels.  Chronic vertigo: Refer for vestibular rehab.  Major depression: Continue Effexor at current dose.  Grief reaction is appropriate  UTI by urinalysis today.  Start Cipro and await culture.  She will need follow-up urine for test of cure.  I need to get records to document her history of recurrent UTIs.  May need prophylaxis.  Hyperlipidemia: Restart Crestor.  Follow up: Return in about 6 weeks (around 05/07/2019) for recheck breathing and dizziness.  Orders Placed This Encounter  Procedures  . Urine Culture  . Dexa BC  . mammo BC  . CBC w/Diff  . CMP  . Lipids  . HgB A1c  . TSH  . PT vestibular rehab  . UA POCT  . EKG   Meds ordered this encounter  Medications  . triamterene-hydrochlorothiazide (MAXZIDE-25) 37.5-25 MG tablet    Sig: Take 1 tablet by mouth daily.    Dispense:  90 tablet    Refill:  3  . Tiotropium Bromide-Olodaterol (STIOLTO RESPIMAT) 2.5-2.5 MCG/ACT AERS    Sig: Inhale 2 puffs into the lungs daily.    Dispense:  4 g    Refill:  11  . rosuvastatin (CRESTOR) 10 MG tablet    Sig: Take 1 tablet (10 mg total) by mouth daily.    Dispense:  90 tablet    Refill:  3  . losartan (COZAAR) 100 MG tablet    Sig: Take 1 tablet (100 mg total) by mouth daily.    Dispense:  90 tablet    Refill:  3   . ciprofloxacin (CIPRO) 500 MG tablet    Sig: Take 1 tablet (500 mg total) by mouth 2 (two) times daily for 7 days.    Dispense:  14 tablet    Refill:  0      Lifestyle: Body mass index is 32.63 kg/m. Wt Readings from Last 3 Encounters:  03/26/19 214 lb 9.6 oz (97.3 kg)  12/05/18 206 lb (93.4 kg)  08/15/18 212 lb (96.2 kg)    Patient Active Problem List   Diagnosis Date Noted  . Panlobular emphysema ([place]) 06/17/2014    Priority: High    Overview:  Long-term former smoker, chest CT March 2016 shows early fibrotic changes and apices and emphysematous changes.  Inhalers started   . Major depression, chronic 01/28/2014    Priority: High  . Obesity (BMI 30.0-34.9) 07/15/2013    Priority: High  . Acquired hypothyroidism 04/11/2007    Priority:  High  . Mixed hyperlipidemia 04/11/2007    Priority: High  . Essential hypertension 04/11/2007    Priority: High  . Hepatic steatosis 03/26/2019    Priority: Medium  . Osteopenia of femoral neck 05/24/2016    Priority: Medium    Overview:  Dexa 05/2016 T = -1.4; stable. No significant decrease. Recheck in 2-3 years.   Marland Kitchen RENAL CALCULUS, HX OF 04/11/2007    Priority: Medium  . Postmenopausal symptoms 02/14/2017    Priority: Low    Uses clonidine prn at night to help her sleep IF needed for hot flushes or poor sleep. Started by GYN years ago. occasional use.   . Grade II hemorrhoids 05/03/2016    Priority: Low  . Snoring 05/06/2014    Priority: Low    Overview:  Negative sleep study 2013   . Allergic rhinitis 12/18/2009    Priority: Low    Overview:  10/1 IMO update   . Rosacea 04/11/2007    Priority: Low    Qualifier: Diagnosis of  By: Hulan Saas, CMA (AAMA), Shannon S    . URINARY INCONTINENCE 04/11/2007    Priority: Low    Qualifier: Diagnosis of  By: Regis Bill MD, Standley Brooking    . Tubular adenoma of colon 03/26/2019    Colonoscopy repeat in 3 years   . ACE-inhibitor cough 10/02/2017  . Incontinence of feces  08/14/2017   Health Maintenance  Topic Date Due  . DEXA SCAN  05/21/2018  . MAMMOGRAM  01/27/2019  . COLONOSCOPY  05/12/2020  . TETANUS/TDAP  10/15/2021  . INFLUENZA VACCINE  Completed  . Hepatitis C Screening  Completed  . PNA vac Low Risk Adult  Completed   Immunization History  Administered Date(s) Administered  . Hepatitis A, Adult 04/06/2018  . Influenza Split 01/06/2009  . Influenza, High Dose Seasonal PF 12/17/2014, 12/20/2015, 01/15/2017, 12/18/2017, 11/28/2018  . Influenza, Seasonal, Injecte, Preservative Fre 01/07/2014, 12/17/2014  . Influenza,inj,Quad PF,6+ Mos 01/15/2017  . Influenza,trivalent, recombinat, inj, PF 12/03/2012  . Influenza-Unspecified 01/15/2017  . Pneumococcal Conjugate-13 01/07/2014  . Pneumococcal Polysaccharide-23 11/23/2011  . Td 03/14/2006, 10/16/2011  . Zoster 12/06/2013  . Zoster Recombinat (Shingrix) 10/21/2017, 12/18/2017   We updated and reviewed the patient's past history in detail and it is documented below. Allergies: Patient is allergic to ace inhibitors; clarithromycin; codeine; and telmisartan. Past Medical History Patient  has a past medical history of Asthma, Depression, Hemorrhoids, Hyperlipidemia, Hypertension, Hypothyroidism, Obesity, Rosacea, and Tubular adenoma of colon (03/26/2019). Past Surgical History Patient  has a past surgical history that includes Nasal sinus surgery; Dental surgery (2016); Cataract extraction w/ intraocular lens implant (Bilateral, 2016); Fracture surgery (Left, 2013); Vaginectomy, partial; and Anterior and posterior vaginal repair (2020). Family History: Patient family history includes Alcohol abuse in her father; Diabetes in her maternal grandmother; Drug abuse in her son; Heart disease in her mother; Hypertension in her mother; Osteoporosis in her mother; Stroke in her mother. Social History:  Patient  reports that she has quit smoking. Her smoking use included cigarettes. She has never used smokeless  tobacco. She reports current alcohol use of about 1.0 standard drinks of alcohol per week. She reports that she does not use drugs.  Review of Systems: Constitutional: negative for fever or malaise Ophthalmic: negative for photophobia, double vision or loss of vision Gastrointestinal: negative for abdominal pain, change in bowel habits or melena Genitourinary: negative for dysuria or gross hematuria, no abnormal uterine bleeding or disharge Musculoskeletal: negative for new gait disturbance or muscular weakness Integumentary:  negative for new or persistent rashes, no breast lumps Neurological: negative for TIA or stroke symptoms Psychiatric: negative for SI or delusions Allergic/Immunologic: negative for hives  Patient Care Team    Relationship Specialty Notifications Start End  Leamon Arnt, MD PCP - General Family Medicine  10/04/17   Elroy Channel, MD Referring Physician Internal Medicine  10/02/17   Rockey Situ Consulting Physician Obstetrics and Gynecology  03/26/19   Trinda Pascal, [place] Physician Urology  03/26/19     Objective  Vitals: BP (!) 170/82 (BP Location: Right Arm, Patient Position: Sitting, Cuff Size: Normal)   Pulse 62   Temp (!) 97.5 F (36.4 C) (Temporal)   Ht _0  (1.727 m)   Wt 214 lb 9.6 oz (97.3 kg)   SpO2 96%   BMI 32.63 kg/m  General:  Well developed, well nourished, no acute distress  Psych:  Alert and orientedx3,normal mood and affect HEENT:  Normocephalic, atraumatic, non-icteric sclera, PERRL,supple neck without adenopathy, mass or thyromegaly Cardiovascular:  Normal S1, S2, RRR without gallop, rub or murmur, nondisplaced PMI Respiratory:  Good breath sounds bilaterally, CTAB with normal respiratory effort Gastrointestinal: normal bowel sounds, soft, mild right lower quadrant tenderness present without rebound or guarding, no noted masses. No HSM MSK: no deformities, contusions. Joints are without erythema or swelling. Spine  and CVA region are nontender Skin:  Warm, no rashes or suspicious lesions noted Neurologic:    Mental status is normal.Gross motor and sensory exams are normal. Normal gait. No tremor  Office Visit on 03/26/2019  Component Date Value Ref Range Status  . Color, UA 03/26/2019 Yellow   Final  . Clarity, UA 03/26/2019 cloudy   Final  . Glucose, UA 03/26/2019 Negative  Negative Final  . Bilirubin, UA 03/26/2019 Negative   Final  . Ketones, UA 03/26/2019 Negative   Final  . Spec Grav, UA 03/26/2019 1.015  1.010 - 1.025 Final  . Blood, UA 03/26/2019 Negative   Final  . pH, UA 03/26/2019 6.0  5.0 - 8.0 Final  . Protein, UA 03/26/2019 Negative  Negative Final  . Urobilinogen, UA 03/26/2019 0.2  0.2 or 1.0 E.U./dL Final  . Nitrite, UA 03/26/2019 positive   Final  . Leukocytes, UA 03/26/2019 Moderate (2+)* Negative Final   EKG: brady w/ nonspecific ST changes. No q waves. Different when compared to 2003.   Commons side effects, risks, benefits, and alternatives for medications and treatment plan prescribed today were discussed, and the patient expressed understanding of the given instructions. Patient is instructed to call or message via MyChart if he/she has any questions or concerns regarding our treatment plan. No barriers to understanding were identified. We discussed Red Flag symptoms and signs in detail. Patient expressed understanding regarding what to do in case of urgent or emergency type symptoms.   Medication list was reconciled, printed and provided to the patient in AVS. Patient instructions and summary information was reviewed with the patient as documented in the AVS. This note was prepared with assistance of Dragon voice recognition software. Occasional wrong-word or sound-a-like substitutions may have occurred due to the inherent limitations of voice recognition software  This visit occurred during the SARS-CoV-2 public health emergency.  Safety protocols were in place, including  screening questions prior to the visit, additional usage of staff PPE, and extensive cleaning of exam room while observing appropriate contact time as indicated for disinfecting solutions.

## 2019-03-27 ENCOUNTER — Telehealth: Payer: Self-pay | Admitting: Family Medicine

## 2019-03-27 NOTE — Telephone Encounter (Signed)
-----  Message from [place], [place] sent at 03/27/2019 11:13 AM EST ----- Patient was notified of her lab results and message. Patient also states that Cipro never clears up her infection and would like to be prescribed something else such as Keflex.

## 2019-03-28 LAB — URINE CULTURE
MICRO NUMBER:: 10033718
SPECIMEN QUALITY:: ADEQUATE

## 2019-03-29 ENCOUNTER — Encounter: Payer: Self-pay | Admitting: Family Medicine

## 2019-03-29 MED ORDER — NITROFURANTOIN MONOHYD MACRO 100 MG PO CAPS: 100.0000 mg | ORAL_CAPSULE | Freq: Two times a day (BID) | ORAL | 0 refills | Status: DC

## 2019-03-29 NOTE — Addendum Note (Signed)
Addended by: Billey Chang on: 03/29/2019 07:55 AM   Modules accepted: Orders

## 2019-04-01 ENCOUNTER — Other Ambulatory Visit: Payer: Self-pay

## 2019-04-01 DIAGNOSIS — J431 Panlobular emphysema: Secondary | ICD-10-CM

## 2019-04-01 MED ORDER — STIOLTO RESPIMAT 2.5-2.5 MCG/ACT IN AERS: 2.0000 | INHALATION_SPRAY | Freq: Every day | RESPIRATORY_TRACT | 11 refills | Status: DC

## 2019-04-05 ENCOUNTER — Other Ambulatory Visit: Payer: Self-pay | Admitting: Family Medicine

## 2019-04-06 ENCOUNTER — Other Ambulatory Visit: Payer: Self-pay | Admitting: *Deleted

## 2019-04-06 DIAGNOSIS — J431 Panlobular emphysema: Secondary | ICD-10-CM

## 2019-04-06 MED ORDER — STIOLTO RESPIMAT 2.5-2.5 MCG/ACT IN AERS: 2.0000 | INHALATION_SPRAY | Freq: Every day | RESPIRATORY_TRACT | 11 refills | Status: DC

## 2019-04-10 ENCOUNTER — Ambulatory Visit (INDEPENDENT_AMBULATORY_CARE_PROVIDER_SITE_OTHER): Payer: Medicare Other | Admitting: Family Medicine

## 2019-04-10 ENCOUNTER — Encounter: Payer: Self-pay | Admitting: Family Medicine

## 2019-04-10 ENCOUNTER — Other Ambulatory Visit: Payer: Self-pay

## 2019-04-10 VITALS — BP 142/84 | HR 93 | Temp 97.1°F | Ht 68.0 in | Wt 209.6 lb

## 2019-04-10 DIAGNOSIS — J431 Panlobular emphysema: Secondary | ICD-10-CM

## 2019-04-10 DIAGNOSIS — N390 Urinary tract infection, site not specified: Secondary | ICD-10-CM | POA: Diagnosis not present

## 2019-04-10 DIAGNOSIS — I10 Essential (primary) hypertension: Secondary | ICD-10-CM

## 2019-04-10 DIAGNOSIS — E782 Mixed hyperlipidemia: Secondary | ICD-10-CM | POA: Diagnosis not present

## 2019-04-10 DIAGNOSIS — R42 Dizziness and giddiness: Secondary | ICD-10-CM | POA: Diagnosis not present

## 2019-04-10 DIAGNOSIS — R739 Hyperglycemia, unspecified: Secondary | ICD-10-CM

## 2019-04-10 LAB — POCT URINALYSIS DIPSTICK
Bilirubin, UA: NEGATIVE
Blood, UA: NEGATIVE
Glucose, UA: NEGATIVE
Ketones, UA: NEGATIVE
Nitrite, UA: POSITIVE
Protein, UA: NEGATIVE
Spec Grav, UA: 1.02 (ref 1.010–1.025)
Urobilinogen, UA: 0.2 E.U./dL
pH, UA: 6 (ref 5.0–8.0)

## 2019-04-10 MED ORDER — AMLODIPINE BESYLATE 5 MG PO TABS: 5.0000 mg | ORAL_TABLET | Freq: Every day | ORAL | 3 refills | Status: DC

## 2019-04-10 NOTE — Progress Notes (Signed)
Subjective  CC:  Chief Complaint  Patient presents with  . Discuss urine culture    would like to review results from 03/26/2019  . Hypertension    no improvements she has log for review in office today. no chest pains sob or leg swelling     HPI: Susan Hunt is a 74 y.o. female who presents to the office today to address the problems listed above in the chief complaint.  Recurrent UTI's - recently treated for E.coli uti; only symptom was urine odor. It resolved but is starting to return. Reports about 8 UTIs this year! 3 documented in this office but goes to urgent cares as well. No f/c/ or gross hematuria. Never gets dysuria. no h/o pyelo or urosepsis.   HTN: changed meds earlier this month: stopped BB due to active sob and COPD. Has ace cough. On arb and maxzide now. Feeling much better; no longer sob off bb and using stiolto for copd. No longer with DOE. "I feel good now". No cp. Reviewed chart; had been on amlodipine in the past but was stopped due to change in tx. No h/o AEs.   Vertigo: persists. Needs vestibular rehab. No new sxs.   Reviewed labs: a1c 7.2! Working on diet.    Assessment  1. Essential hypertension   2. Mixed hyperlipidemia   3. Recurrent UTI   4. Chronic vertigo   5. Panlobular emphysema ([place])      Plan   HTN: improving.  Add amlodipine. Cont arb and dyzazice  HLD on statin  Recurrent UTI: recheck TOC culture today. Start abx prophylaxis and monitor. Urology if recurs   Vertigo: refer for vestibular rehab  Copd: improved now back on inhalers.   Possible new diabetes: work on diet and recheck at next visit. To start meds/treatment at that time if persists. Would need to push ldl lower as well.   Follow up:   05/08/2019  Orders Placed This Encounter  Procedures  . Urine Culture  . PT vestibular rehab  . POCT urinalysis dipstick   Meds ordered this encounter  Medications  . amLODipine (NORVASC) 5 MG tablet    Sig: Take 1 tablet (5 mg  total) by mouth daily.    Dispense:  90 tablet    Refill:  3      I reviewed the patients updated PMH, FH, and SocHx.    Patient Active Problem List   Diagnosis Date Noted  . Panlobular emphysema ([place]) 06/17/2014    Priority: High  . Major depression, chronic 01/28/2014    Priority: High  . Obesity (BMI 30.0-34.9) 07/15/2013    Priority: High  . Acquired hypothyroidism 04/11/2007    Priority: High  . Mixed hyperlipidemia 04/11/2007    Priority: High  . Essential hypertension 04/11/2007    Priority: High  . Hepatic steatosis 03/26/2019    Priority: Medium  . Osteopenia of femoral neck 05/24/2016    Priority: Medium  . RENAL CALCULUS, HX OF 04/11/2007    Priority: Medium  . Postmenopausal symptoms 02/14/2017    Priority: Low  . Grade II hemorrhoids 05/03/2016    Priority: Low  . Snoring 05/06/2014    Priority: Low  . Allergic rhinitis 12/18/2009    Priority: Low  . Rosacea 04/11/2007    Priority: Low  . URINARY INCONTINENCE 04/11/2007    Priority: Low  . Tubular adenoma of colon 03/26/2019  . ACE-inhibitor cough 10/02/2017  . Incontinence of feces 08/14/2017   Current Meds  Medication  Sig  . albuterol (PROVENTIL HFA;VENTOLIN HFA) 108 (90 Base) MCG/ACT inhaler Inhale 2 puffs into the lungs every 4 (four) hours as needed for wheezing or shortness of breath.  . cloNIDine (CATAPRES) 0.1 MG tablet Take 1 tablet (0.1 mg total) by mouth at bedtime as needed (sleep).  . fluticasone (FLONASE) 50 MCG/ACT nasal spray Place 2 sprays into the nose as needed.   Marland Kitchen levothyroxine (SYNTHROID) 137 MCG tablet TAKE ONE TABLET BY MOUTH EVERY MORNING BEFORE BREAKFAST  . losartan (COZAAR) 100 MG tablet Take 1 tablet (100 mg total) by mouth daily.  . meclizine (ANTIVERT) 25 MG tablet Take 1 tablet (25 mg total) by mouth 3 (three) times daily as needed for dizziness.  . montelukast (SINGULAIR) 10 MG tablet TAKE ONE TABLET BY MOUTH EVERY NIGHT AT BEDTIME  . omeprazole (PRILOSEC) 20 MG capsule  TAKE ONE CAPSULE BY MOUTH DAILY  . rosuvastatin (CRESTOR) 10 MG tablet Take 1 tablet (10 mg total) by mouth daily.  . Tiotropium Bromide-Olodaterol (STIOLTO RESPIMAT) 2.5-2.5 MCG/ACT AERS Inhale 2 puffs into the lungs daily.  Marland Kitchen triamterene-hydrochlorothiazide (MAXZIDE-25) 37.5-25 MG tablet Take 1 tablet by mouth daily.  Marland Kitchen venlafaxine XR (EFFEXOR-XR) 150 MG 24 hr capsule TAKE ONE CAPSULE BY MOUTH DAILY    Allergies: Patient is allergic to beta adrenergic blockers; ace inhibitors; clarithromycin; codeine; and telmisartan. Family History: Patient family history includes Alcohol abuse in her father; Diabetes in her maternal grandmother; Drug abuse in her son; Heart disease in her mother; Hypertension in her mother; Osteoporosis in her mother; Stroke in her mother. Social History:  Patient  reports that she has quit smoking. Her smoking use included cigarettes. She has never used smokeless tobacco. She reports current alcohol use of about 1.0 standard drinks of alcohol per week. She reports that she does not use drugs.  Review of Systems: Constitutional: Negative for fever malaise or anorexia Cardiovascular: negative for chest pain Respiratory: negative for SOB or persistent cough Gastrointestinal: negative for abdominal pain  Objective  Vitals: BP (!) 142/84 (BP Location: Left Arm, Patient Position: Sitting, Cuff Size: Large)   Pulse 93   Temp (!) 97.1 F (36.2 C) (Temporal)   Ht _0  (1.727 m)   Wt 209 lb 9.6 oz (95.1 kg)   SpO2 99%   BMI 31.87 kg/m  General: no acute distress , A&Ox3 HEENT: PEER[place], conjunctiva normal, Cardiovascular:  RRR without murmur or gallop.  Respiratory:  Good breath sounds bilaterally, CTAB with normal respiratory effort Skin:  Warm, no rashes  No visits with results within 1 Day(s) from this visit.  Latest known visit with results is:  Office Visit on 03/26/2019  Component Date Value Ref Range Status  . WBC 03/26/2019 11.7* 4.0 - 10.5 K/uL Final  . RBC  03/26/2019 4.90  3.87 - 5.11 Mil/uL Final  . Hemoglobin 03/26/2019 14.0  12.0 - 15.0 g/dL Final  . HCT 03/26/2019 43.4  36.0 - 46.0 % Final  . MCV 03/26/2019 88.5  78.0 - 100.0 fl Final  . MCHC 03/26/2019 32.2  30.0 - 36.0 g/dL Final  . RDW 03/26/2019 14.1  11.5 - 15.5 % Final  . Platelets 03/26/2019 299.0  150.0 - 400.0 K/uL Final  . Neutrophils Relative % 03/26/2019 58.8  43.0 - 77.0 % Final  . Lymphocytes Relative 03/26/2019 30.0  12.0 - 46.0 % Final  . Monocytes Relative 03/26/2019 7.9  3.0 - 12.0 % Final  . Eosinophils Relative 03/26/2019 2.7  0.0 - 5.0 % Final  . Basophils  Relative 03/26/2019 0.6  0.0 - 3.0 % Final  . Neutro Abs 03/26/2019 6.9  1.4 - 7.7 K/uL Final  . Lymphs Abs 03/26/2019 3.5  0.7 - 4.0 K/uL Final  . Monocytes Absolute 03/26/2019 0.9  0.1 - 1.0 K/uL Final  . Eosinophils Absolute 03/26/2019 0.3  0.0 - 0.7 K/uL Final  . Basophils Absolute 03/26/2019 0.1  0.0 - 0.1 K/uL Final  . Sodium 03/26/2019 138  135 - 145 mEq/L Final  . Potassium 03/26/2019 4.3  3.5 - 5.1 mEq/L Final  . Chloride 03/26/2019 103  96 - 112 mEq/L Final  . CO2 03/26/2019 29  19 - 32 mEq/L Final  . Glucose, Bld 03/26/2019 115* 70 - 99 mg/dL Final  . BUN 03/26/2019 13  6 - 23 mg/dL Final  . Creatinine, Ser 03/26/2019 1.05  0.40 - 1.20 mg/dL Final  . Total Bilirubin 03/26/2019 0.7  0.2 - 1.2 mg/dL Final  . Alkaline Phosphatase 03/26/2019 85  39 - 117 U/L Final  . AST 03/26/2019 45* 0 - 37 U/L Final  . ALT 03/26/2019 39* 0 - 35 U/L Final  . Total Protein 03/26/2019 8.1  6.0 - 8.3 g/dL Final  . Albumin 03/26/2019 4.3  3.5 - 5.2 g/dL Final  . GFR 03/26/2019 51.17* >60.00 mL/min Final  . Calcium 03/26/2019 9.8  8.4 - 10.5 mg/dL Final  . Cholesterol 03/26/2019 235* 0 - 200 mg/dL Final  . Triglycerides 03/26/2019 156.0* 0.0 - 149.0 mg/dL Final  . HDL 03/26/2019 61.90  >39.00 mg/dL Final  . VLDL 03/26/2019 31.2  0.0 - 40.0 mg/dL Final  . LDL Cholesterol 03/26/2019 142* 0 - 99 mg/dL Final  . Total  CHOL/HDL Ratio 03/26/2019 4   Final  . NonHDL 03/26/2019 173.19   Final  . Hgb A1c MFr Bld 03/26/2019 7.2* 4.6 - 6.5 % Final  . TSH 03/26/2019 3.15  0.35 - 4.50 uIU/mL Final  . Color, UA 03/26/2019 Yellow   Final  . Clarity, UA 03/26/2019 cloudy   Final  . Glucose, UA 03/26/2019 Negative  Negative Final  . Bilirubin, UA 03/26/2019 Negative   Final  . Ketones, UA 03/26/2019 Negative   Final  . Spec Grav, UA 03/26/2019 1.015  1.010 - 1.025 Final  . Blood, UA 03/26/2019 Negative   Final  . pH, UA 03/26/2019 6.0  5.0 - 8.0 Final  . Protein, UA 03/26/2019 Negative  Negative Final  . Urobilinogen, UA 03/26/2019 0.2  0.2 or 1.0 E.U./dL Final  . Nitrite, UA 03/26/2019 positive   Final  . Leukocytes, UA 03/26/2019 Moderate (2+)* Negative Final  . MICRO NUMBER: 03/26/2019 [id number]   Final  . SPECIMEN QUALITY: 03/26/2019 Adequate   Final  . Sample Source 03/26/2019 URINE   Final  . STATUS: 03/26/2019 FINAL   Final  . ISOLATE 1: 03/26/2019 Escherichia coli*  Final      Commons side effects, risks, benefits, and alternatives for medications and treatment plan prescribed today were discussed, and the patient expressed understanding of the given instructions. Patient is instructed to call or message via MyChart if he/she has any questions or concerns regarding our treatment plan. No barriers to understanding were identified. We discussed Red Flag symptoms and signs in detail. Patient expressed understanding regarding what to do in case of urgent or emergency type symptoms.   Medication list was reconciled, printed and provided to the patient in AVS. Patient instructions and summary information was reviewed with the patient as documented in the AVS. This note was prepared  with assistance of Systems analyst. Occasional wrong-word or sound-a-like substitutions may have occurred due to the inherent limitations of voice recognition software  This visit occurred during the SARS-CoV-2  public health emergency.  Safety protocols were in place, including screening questions prior to the visit, additional usage of staff PPE, and extensive cleaning of exam room while observing appropriate contact time as indicated for disinfecting solutions.

## 2019-04-10 NOTE — Patient Instructions (Signed)
Please return in 3 months for for follow up of your hypertension.  I will order antibiotic to prevent UTI after I get the urine culture results back.  We will restart amlodipine. Continue your other blood pressure medications.   Go get your hearing checked again.   If you have any questions or concerns, please don't hesitate to send me a message via MyChart or call the office at [phone]. Thank you for visiting with [place] today! It's our pleasure caring for you.

## 2019-04-11 LAB — URINE CULTURE
MICRO NUMBER:: 10087307
SPECIMEN QUALITY:: ADEQUATE

## 2019-04-12 DIAGNOSIS — N390 Urinary tract infection, site not specified: Secondary | ICD-10-CM | POA: Insufficient documentation

## 2019-04-12 MED ORDER — NITROFURANTOIN MONOHYD MACRO 100 MG PO CAPS: 100.0000 mg | ORAL_CAPSULE | Freq: Every day | ORAL | 3 refills | Status: DC

## 2019-04-12 NOTE — Addendum Note (Signed)
Addended by: Billey Chang on: 04/12/2019 10:52 AM   Modules accepted: Orders

## 2019-04-14 ENCOUNTER — Ambulatory Visit: Payer: Medicare Other

## 2019-04-20 ENCOUNTER — Ambulatory Visit: Payer: Medicare Other

## 2019-04-25 ENCOUNTER — Ambulatory Visit: Payer: Medicare Other

## 2019-05-02 DIAGNOSIS — H903 Sensorineural hearing loss, bilateral: Secondary | ICD-10-CM | POA: Diagnosis not present

## 2019-05-08 ENCOUNTER — Ambulatory Visit: Payer: Medicare Other | Admitting: Family Medicine

## 2019-05-08 DIAGNOSIS — M109 Gout, unspecified: Secondary | ICD-10-CM | POA: Diagnosis not present

## 2019-05-22 ENCOUNTER — Encounter: Payer: Self-pay | Admitting: Family Medicine

## 2019-05-22 DIAGNOSIS — Z961 Presence of intraocular lens: Secondary | ICD-10-CM | POA: Diagnosis not present

## 2019-05-22 DIAGNOSIS — H43813 Vitreous degeneration, bilateral: Secondary | ICD-10-CM | POA: Diagnosis not present

## 2019-06-05 ENCOUNTER — Other Ambulatory Visit: Payer: Self-pay | Admitting: Family Medicine

## 2019-06-12 ENCOUNTER — Other Ambulatory Visit: Payer: Self-pay | Admitting: Family Medicine

## 2019-06-12 DIAGNOSIS — Z1231 Encounter for screening mammogram for malignant neoplasm of breast: Secondary | ICD-10-CM

## 2019-06-13 DIAGNOSIS — C44729 Squamous cell carcinoma of skin of left lower limb, including hip: Secondary | ICD-10-CM | POA: Diagnosis not present

## 2019-06-13 DIAGNOSIS — L820 Inflamed seborrheic keratosis: Secondary | ICD-10-CM | POA: Diagnosis not present

## 2019-06-13 DIAGNOSIS — R208 Other disturbances of skin sensation: Secondary | ICD-10-CM | POA: Diagnosis not present

## 2019-06-14 ENCOUNTER — Other Ambulatory Visit: Payer: Self-pay | Admitting: Family Medicine

## 2019-06-14 ENCOUNTER — Ambulatory Visit
Admission: RE | Admit: 2019-06-14 | Discharge: 2019-06-14 | Disposition: A | Discharge status: Home / Self Care | Payer: Medicare Other | Source: Ambulatory Visit | Attending: Family Medicine | Admitting: Family Medicine

## 2019-06-14 ENCOUNTER — Other Ambulatory Visit: Payer: Self-pay

## 2019-06-14 DIAGNOSIS — Z780 Asymptomatic menopausal state: Secondary | ICD-10-CM | POA: Diagnosis not present

## 2019-06-14 DIAGNOSIS — M85851 Other specified disorders of bone density and structure, right thigh: Secondary | ICD-10-CM | POA: Diagnosis not present

## 2019-06-14 DIAGNOSIS — E2839 Other primary ovarian failure: Secondary | ICD-10-CM

## 2019-06-14 DIAGNOSIS — Z1231 Encounter for screening mammogram for malignant neoplasm of breast: Secondary | ICD-10-CM

## 2019-06-17 ENCOUNTER — Other Ambulatory Visit: Payer: Self-pay | Admitting: Family Medicine

## 2019-06-17 DIAGNOSIS — R928 Other abnormal and inconclusive findings on diagnostic imaging of breast: Secondary | ICD-10-CM

## 2019-06-17 DIAGNOSIS — R8290 Unspecified abnormal findings in urine: Secondary | ICD-10-CM | POA: Diagnosis not present

## 2019-06-20 ENCOUNTER — Telehealth: Payer: Self-pay | Admitting: Family Medicine

## 2019-06-20 NOTE — Telephone Encounter (Signed)
Patient states she was prescribed macrobid by Dr. Jonni Sanger.  States she seen her gynecologist today.  Was given a urinalysis and still was showing signs of a UTI.  Gynecologist has placed patient on Keflex for 7 days.  Patient wanted to update Dr. Jonni Sanger.

## 2019-06-21 NOTE — Telephone Encounter (Signed)
noted

## 2019-06-26 DIAGNOSIS — B349 Viral infection, unspecified: Secondary | ICD-10-CM | POA: Diagnosis not present

## 2019-06-27 ENCOUNTER — Telehealth: Payer: Self-pay | Admitting: Family Medicine

## 2019-06-27 NOTE — Telephone Encounter (Signed)
Chief Complaint BREATHING - shortness of breath or sounds breathless Reason for Call Symptomatic / Request for [place] states she is having SOB and cough. [place] Not Listed [facility] Translation No Nurse Assessment Nurse: Thad Ranger, RN, Langley Gauss Date/Time (Eastern Time): 06/26/2019 4:54:32 PM Confirm and document reason for call. If symptomatic, describe symptoms. ---Caller states she is having SOB and cough. Has the patient had close contact with a person known or suspected to have the novel coronavirus illness OR traveled / lives in area with major community spread (including international travel) in the last 14 days from the onset of symptoms? * If Asymptomatic, screen for exposure and travel within the last 14 days. ---No Does the patient have any new or worsening symptoms? ---Yes Will a triage be completed? ---Yes Related visit to physician within the last 2 weeks? ---No Does the PT have any chronic conditions? (i.e. diabetes, asthma, this includes High risk factors for pregnancy, etc.) ---Yes List chronic conditions. ---CAD, COPD Is this a behavioral health or substance abuse call? ---No Guidelines Guideline Title Affirmed Question Affirmed Notes Nurse Date/Time (Eastern Time) Breathing Difficulty [1] MILD difficulty breathing (e.g., minimal/ no SOB at rest, SOB with walking, pulse <100) AND [2] NEW-onset or WORSE than normal Carmon, RN, Langley Gauss 06/26/2019 4:55:28 PMPLEASE NOTE: All timestamps contained within this report are represented as [place] Standard Time. CONFIDENTIALTY NOTICE: This fax transmission is intended only for the addressee. It contains information that is legally privileged, confidential or otherwise protected from use or disclosure. If you are not the intended recipient, you are strictly prohibited from reviewing, disclosing, copying using or disseminating any of this information or taking any action in reliance on or  regarding this information. If you have received this fax in error, please notify us immediately by telephone so that we can arrange for its return to [place]. Phone: [phone], Toll-Free: [phone], Fax: [phone] Page: 2 of 2 Call Id: [id number] Duck. Time Eilene Ghazi Time) Disposition Final User 06/26/2019 4:51:00 PM Send to Urgent Queue Jamal Maes 06/26/2019 5:00:21 PM See HCP within 4 Hours (or PCP triage) Yes Carmon, RN, Yevette Edwards Disagree/Comply Comply Caller Understands Yes PreDisposition Call Doctor Care Advice Given Per Guideline SEE HCP WITHIN 4 HOURS (OR PCP TRIAGE): CALL BACK IF: * You become worse. CARE ADVICE given per Breathing Difficulty (Adult) guideline. Comments User: Romeo Apple, RN Date/Time Eilene Ghazi Time): 06/26/2019 4:59:52 PM [facility] CLOSES AT [place] [id number]. PT WILL BE SEEN AT [facility] Referrals GO TO FACILITY OTHER - [facility]

## 2019-06-27 NOTE — Telephone Encounter (Signed)
Called and LMOVM checking to see if patient seen at [facility] yesterday/ how is she feeling

## 2019-07-01 ENCOUNTER — Ambulatory Visit
Admission: RE | Admit: 2019-07-01 | Discharge: 2019-07-01 | Disposition: A | Discharge status: Home / Self Care | Payer: Medicare Other | Source: Ambulatory Visit | Attending: Family Medicine | Admitting: Family Medicine

## 2019-07-01 ENCOUNTER — Other Ambulatory Visit: Payer: Self-pay | Admitting: Family Medicine

## 2019-07-01 ENCOUNTER — Other Ambulatory Visit: Payer: Self-pay

## 2019-07-01 DIAGNOSIS — R928 Other abnormal and inconclusive findings on diagnostic imaging of breast: Secondary | ICD-10-CM

## 2019-07-01 DIAGNOSIS — N6002 Solitary cyst of left breast: Secondary | ICD-10-CM | POA: Diagnosis not present

## 2019-07-01 DIAGNOSIS — R921 Mammographic calcification found on diagnostic imaging of breast: Secondary | ICD-10-CM | POA: Diagnosis not present

## 2019-07-02 ENCOUNTER — Telehealth: Payer: Self-pay | Admitting: Family Medicine

## 2019-07-02 NOTE — Telephone Encounter (Signed)
I called the patient to schedule AWV with Loma Sousa, but she declined.  She said that she's not interested in a wellness visit.

## 2019-07-08 DIAGNOSIS — R82998 Other abnormal findings in urine: Secondary | ICD-10-CM | POA: Diagnosis not present

## 2019-07-08 DIAGNOSIS — N8110 Cystocele, unspecified: Secondary | ICD-10-CM | POA: Diagnosis not present

## 2019-07-12 ENCOUNTER — Ambulatory Visit: Payer: Medicare Other | Admitting: Family Medicine

## 2019-07-13 ENCOUNTER — Other Ambulatory Visit: Payer: Self-pay | Admitting: Family Medicine

## 2019-07-18 DIAGNOSIS — Z9889 Other specified postprocedural states: Secondary | ICD-10-CM | POA: Diagnosis not present

## 2019-07-18 DIAGNOSIS — Z9682 Presence of neurostimulator: Secondary | ICD-10-CM | POA: Diagnosis not present

## 2019-07-18 HISTORY — PX: OTHER SURGICAL HISTORY: SHX169

## 2019-07-19 ENCOUNTER — Encounter: Payer: Self-pay | Admitting: Family Medicine

## 2019-07-19 ENCOUNTER — Other Ambulatory Visit: Payer: Self-pay

## 2019-07-19 ENCOUNTER — Ambulatory Visit (INDEPENDENT_AMBULATORY_CARE_PROVIDER_SITE_OTHER): Payer: Medicare Other

## 2019-07-19 ENCOUNTER — Ambulatory Visit (INDEPENDENT_AMBULATORY_CARE_PROVIDER_SITE_OTHER): Payer: Medicare Other | Admitting: Family Medicine

## 2019-07-19 VITALS — BP 148/78 | HR 73 | Temp 97.3°F | Resp 17 | Ht 68.0 in | Wt 205.6 lb

## 2019-07-19 DIAGNOSIS — E119 Type 2 diabetes mellitus without complications: Secondary | ICD-10-CM | POA: Diagnosis not present

## 2019-07-19 DIAGNOSIS — R42 Dizziness and giddiness: Secondary | ICD-10-CM | POA: Insufficient documentation

## 2019-07-19 DIAGNOSIS — N390 Urinary tract infection, site not specified: Secondary | ICD-10-CM

## 2019-07-19 DIAGNOSIS — I10 Essential (primary) hypertension: Secondary | ICD-10-CM

## 2019-07-19 DIAGNOSIS — J431 Panlobular emphysema: Secondary | ICD-10-CM | POA: Diagnosis not present

## 2019-07-19 DIAGNOSIS — E782 Mixed hyperlipidemia: Secondary | ICD-10-CM | POA: Diagnosis not present

## 2019-07-19 DIAGNOSIS — R918 Other nonspecific abnormal finding of lung field: Secondary | ICD-10-CM | POA: Diagnosis not present

## 2019-07-19 DIAGNOSIS — R32 Unspecified urinary incontinence: Secondary | ICD-10-CM

## 2019-07-19 LAB — MICROALBUMIN / CREATININE URINE RATIO
Creatinine,U: 42 mg/dL
Microalb Creat Ratio: 1.7 mg/g (ref 0.0–30.0)
Microalb, Ur: <0.7 mg/dL (ref 0.0–1.9)

## 2019-07-19 LAB — COMPREHENSIVE METABOLIC PANEL
ALT: 30 U/L (ref 0–35)
AST: 43 U/L — ABNORMAL HIGH (ref 0–37)
Albumin: 4.2 g/dL (ref 3.5–5.2)
Alkaline Phosphatase: 89 U/L (ref 39–117)
BUN: 14 mg/dL (ref 6–23)
CO2: 30 mEq/L (ref 19–32)
Calcium: 9.6 mg/dL (ref 8.4–10.5)
Chloride: 102 mEq/L (ref 96–112)
Creatinine, Ser: 1.01 mg/dL (ref 0.40–1.20)
GFR: 53.47 mL/min — ABNORMAL LOW (ref >60.00)
Glucose, Bld: 144 mg/dL — ABNORMAL HIGH (ref 70–99)
Potassium: 4.2 mEq/L (ref 3.5–5.1)
Sodium: 138 mEq/L (ref 135–145)
Total Bilirubin: 0.8 mg/dL (ref 0.2–1.2)
Total Protein: 7.5 g/dL (ref 6.0–8.3)

## 2019-07-19 LAB — LIPID PANEL
Cholesterol: 154 mg/dL (ref 0–200)
HDL: 64.2 mg/dL (ref >39.00)
LDL Cholesterol: 61 mg/dL (ref 0–99)
NonHDL: 89.4
Total CHOL/HDL Ratio: 2
Triglycerides: 142 mg/dL (ref 0.0–149.0)
VLDL: 28.4 mg/dL (ref 0.0–40.0)

## 2019-07-19 LAB — POCT GLYCOSYLATED HEMOGLOBIN (HGB A1C): Hemoglobin A1C: 6.4 % — AB (ref 4.0–5.6)

## 2019-07-19 MED ORDER — AMLODIPINE BESYLATE 10 MG PO TABS: 10.0000 mg | ORAL_TABLET | Freq: Every day | ORAL | 3 refills | Status: DC

## 2019-07-19 MED ORDER — FARXIGA 5 MG PO TABS: 5.0000 mg | ORAL_TABLET | Freq: Every day | ORAL | 11 refills | Status: DC

## 2019-07-19 MED ORDER — BREZTRI AEROSPHERE 160-9-4.8 MCG/ACT IN AERO: 2.0000 | INHALATION_SPRAY | Freq: Two times a day (BID) | RESPIRATORY_TRACT | 5 refills | Status: DC

## 2019-07-19 MED ORDER — PREDNISONE 20 MG PO TABS: ORAL_TABLET | ORAL | 0 refills | Status: DC

## 2019-07-19 MED ORDER — ASPIRIN EC 81 MG PO TBEC: 81.0000 mg | DELAYED_RELEASE_TABLET | Freq: Every day | ORAL | Status: DC

## 2019-07-19 NOTE — Progress Notes (Signed)
Subjective  CC:  Chief Complaint  Patient presents with  . Dizziness    "out of balance when I stand up, I've learned to stand still before moving when I stand up"  . Panlobular emphysema    states breathing is horrible, out of breath very easy  . Hypertension    between 150-160/70-85 at home readings, PVC between 3rd and 4th beat during procedure yesterday    HPI: Susan Hunt is a 74 y.o. female who presents to the office today for follow up of diabetes and problems listed above in the chief complaint.   Diabetes follow up: Her diabetic control is reported as Improved. a1c was 7.1 in January. Since has improved her diet. Lost a few pounds.  She denies exertional CP or SOB or symptomatic hypoglycemia. She denies foot sores or paresthesias. Would need an eye exam. On arb.   HTN: home readings are elevated however here in office they are borderline high. On amlodipine 10 and arb.   HLD back on crestor now and tolerating it. nonfasting for lab recheck today.  Chronic vertigo since adolescents. No worse. Now mostly with standing. Checked home orthostatics and they were negative.   Can't breathe well! Worsening DOE: now limited to walking short distances due to sob. No relief with albuterol. Remains on stiolto - this initially helped when she restarted it back in dec/jan but not helping now. Denies audible wheezing or tightness in chest. Quit smoking 15 years ago: 35pak year history. Has had CT of chest showing emphysematous and fibrotic changes but this was years ago. Hasn't seen pulm but self referred and has appt in June. Denies cp or LE swelling or palpitations.   Urology: had bladder stimulator placed yesterday. Stopped macrobid since had 3 UTI's while on it.   Wt Readings from Last 3 Encounters:  07/19/19 205 lb 9.6 oz (93.3 kg)  04/10/19 209 lb 9.6 oz (95.1 kg)  03/26/19 214 lb 9.6 oz (97.3 kg)    BP Readings from Last 3 Encounters:  07/19/19 (!) 148/78  04/10/19 (!)  142/84  03/26/19 (!) 170/82    Assessment  1. Panlobular emphysema ([place])   2. Essential hypertension   3. Mixed hyperlipidemia   4. Chronic vertigo   5. Controlled type 2 diabetes mellitus without complication, without long-term current use of insulin ([place])      Plan   Diabetes is currently well controlled. Diet controlled. Start low dose farxiga if affordable. Continue diet changes. Start asa. Continue arb. Continue statin.  HTN: borderline control. Increase amlodipine to 10 and add farxiga.   HLD on crestor. Goal ldl < 70  Chronic vertigo. No new sxs.   Emphysema: check cxr. Trial of pred burst given sxs of productive cough w/o signs of infection. Change to breztri. Refer to pulm. If neg eval, will need cardiac eval.   Follow up: 3 months for dm and HTN recheck. Orders Placed This Encounter  Procedures  . DG Chest 2 View  . Comprehensive metabolic panel  . Lipid panel  . Microalbumin / creatinine urine ratio  . POCT HgB A1C   Meds ordered this encounter  Medications  . Budeson-Glycopyrrol-Formoterol (BREZTRI AEROSPHERE) 160-9-4.8 MCG/ACT AERO    Sig: Inhale 2 puffs into the lungs in the morning and at bedtime.    Dispense:  10.7 g    Refill:  5  . predniSONE (DELTASONE) 20 MG tablet    Sig: Take 3 tabs daily for 5 days    Dispense:  15 tablet    Refill:  0  . aspirin EC 81 MG tablet    Sig: Take 1 tablet (81 mg total) by mouth daily.  . dapagliflozin propanediol (FARXIGA) 5 MG TABS tablet    Sig: Take 5 mg by mouth daily before breakfast.    Dispense:  30 tablet    Refill:  11  . amLODipine (NORVASC) 10 MG tablet    Sig: Take 1 tablet (10 mg total) by mouth daily.    Dispense:  90 tablet    Refill:  3    Increasing dose from 82m; can cancel 516mrefills      Immunization History  Administered Date(s) Administered  . Hepatitis A, Adult 04/06/2018  . Influenza Split 01/06/2009  . Influenza, High Dose Seasonal PF 12/17/2014, 12/20/2015, 01/15/2017,  12/18/2017, 11/28/2018  . Influenza, Seasonal, Injecte, Preservative Fre 01/07/2014, 12/17/2014  . Influenza,inj,Quad PF,6+ Mos 01/15/2017  . Influenza,trivalent, recombinat, inj, PF 12/03/2012  . Influenza-Unspecified 01/15/2017  . Moderna SARS-COVID-2 Vaccination 04/17/2019, 05/15/2019  . Pneumococcal Conjugate-13 01/07/2014  . Pneumococcal Polysaccharide-23 11/23/2011  . Td 03/14/2006, 10/16/2011  . Zoster 12/06/2013  . Zoster Recombinat (Shingrix) 10/21/2017, 12/18/2017    Diabetes Related Lab Review: Lab Results  Component Value Date   HGBA1C 6.4 (A) 07/19/2019   HGBA1C 7.2 (H) 03/26/2019   HGBA1C 5.9 (A) 08/15/2018    No results found for: MIDerl [place] Results  Component Value Date   CREATININE 1.05 03/26/2019   BUN 13 03/26/2019   NA 138 03/26/2019   K 4.3 03/26/2019   CL 103 03/26/2019   CO2 29 03/26/2019   Lab Results  Component Value Date   CHOL 235 (H) 03/26/2019   CHOL 148 12/19/2017   CHOL 185 02/14/2017   Lab Results  Component Value Date   HDL 61.90 03/26/2019   HDL 60.60 12/19/2017   HDL 61.70 02/14/2017   Lab Results  Component Value Date   LDLCALC 142 (H) 03/26/2019   LDLCALC 65 12/19/2017   LDLCALC 95 02/14/2017   Lab Results  Component Value Date   TRIG 156.0 (H) 03/26/2019   TRIG 111.0 12/19/2017   TRIG 144.0 02/14/2017   Lab Results  Component Value Date   CHOLHDL 4 03/26/2019   CHOLHDL 2 12/19/2017   CHOLHDL 3 02/14/2017   Lab Results  Component Value Date   LDLDIRECT 146.7 05/29/2006   The 10-year ASCVD risk score (GMikey BussingC Jr., et al., 2013) is: 42.8%   Values used to calculate the score:     Age: 89ears     Sex: Female     Is Non-Hispanic African American: No     Diabetic: Yes     Tobacco smoker: No     Systolic Blood Pressure: 14062mHg     Is BP treated: Yes     HDL Cholesterol: 61.9 mg/dL     Total Cholesterol: 235 mg/dL I have reviewed the [facility] Soc history. Patient Active Problem List    Diagnosis Date Noted  . Tubular adenoma of colon 03/26/2019    Priority: High    Colonoscopy repeat in 3 years   . Panlobular emphysema ([place]04/07/2014    Priority: High    Overview:  Long-term former smoker, chest CT March 2016 shows early fibrotic changes and apices and emphysematous changes.  Inhalers started   . Major depression, chronic 01/28/2014    Priority: High  . Obesity (BMI 30.0-34.9) 07/15/2013    Priority: High  . Acquired hypothyroidism  04/11/2007    Priority: High  . Mixed hyperlipidemia 04/11/2007    Priority: High  . Essential hypertension 04/11/2007    Priority: High  . Recurrent UTI 04/12/2019    Priority: Medium    Daily prophylaxis started with [facility] January 2021   . Hepatic steatosis 03/26/2019    Priority: Medium  . Osteopenia of femoral neck 05/24/2016    Priority: Medium    Dexa 05/2019 T = - 1.7 femur. Stable. Recheck 2-3 years. Dexa 05/2016 T = -1.4; stable. No significant decrease. Recheck in 2-3 years.   Marland Kitchen RENAL CALCULUS, HX OF 04/11/2007    Priority: Medium  . ACE-inhibitor cough 10/02/2017    Priority: Low  . Postmenopausal symptoms 02/14/2017    Priority: Low    Uses clonidine prn at night to help her sleep IF needed for hot flushes or poor sleep. Started by GYN years ago. occasional use.   . Grade II hemorrhoids 05/03/2016    Priority: Low  . Snoring 05/06/2014    Priority: Low    Overview:  Negative sleep study 2013   . Allergic rhinitis 12/18/2009    Priority: Low    Overview:  10/1 IMO update   . Rosacea 04/11/2007    Priority: Low    Qualifier: Diagnosis of  By: Hulan Saas, CMA (AAMA), Shannon S    . URINARY INCONTINENCE 04/11/2007    Priority: Low    Qualifier: Diagnosis of  By: Regis Bill MD, Standley Brooking    . Chronic vertigo 07/19/2019  . Controlled type 2 diabetes mellitus without complication, without long-term current use of insulin ([place]) 07/19/2019  . Hyperglycemia 04/10/2019    Social History: Patient  reports  that she has quit smoking. Her smoking use included cigarettes. She has never used smokeless tobacco. She reports current alcohol use of about 1.0 standard drinks of alcohol per week. She reports that she does not use drugs.  Review of Systems: Ophthalmic: negative for eye pain, loss of vision or double vision Cardiovascular: negative for chest pain Respiratory: negative for SOB or persistent cough Gastrointestinal: negative for abdominal pain Genitourinary: negative for dysuria or gross hematuria MSK: negative for foot lesions Neurologic: negative for weakness or gait disturbance  Objective  Vitals: BP (!) 148/78   Pulse 73   Temp (!) 97.3 F (36.3 C) (Temporal)   Resp 17   Ht _0  (1.727 m)   Wt 205 lb 9.6 oz (93.3 kg)   SpO2 98%   BMI 31.26 kg/m  General: well appearing, no acute distress , no respiratory distress Psych:  Alert and oriented, normal mood and affect HEENT:  Normocephalic, atraumatic, moist mucous membranes, supple neck  Cardiovascular:  Nl S1 and S2, RRR without murmur, gallop or rub. no edema Respiratory:  distant breath sounds bilaterally, CTAB with normal effort, no rales no wheezing Foot exam: no erythema, pallor, or cyanosis visible nl proprioception and sensation to monofilament testing bilaterally, +2 distal pulses bilaterally    Diabetic education: ongoing education regarding chronic disease management for diabetes was given today. We continue to reinforce the ABC's of diabetic management: A1c (<7 or 8 dependent upon patient), tight blood pressure control, and cholesterol management with goal LDL < 100 minimally. We discuss diet strategies, exercise recommendations, medication options and possible side effects. At each visit, we review recommended immunizations and preventive care recommendations for diabetics and stress that good diabetic control can prevent other problems. See below for this patient's data.    Commons side effects, risks, benefits, and  alternatives for medications and treatment plan prescribed today were discussed, and the patient expressed understanding of the given instructions. Patient is instructed to call or message via MyChart if he/she has any questions or concerns regarding our treatment plan. No barriers to understanding were identified. We discussed Red Flag symptoms and signs in detail. Patient expressed understanding regarding what to do in case of urgent or emergency type symptoms.   Medication list was reconciled, printed and provided to the patient in AVS. Patient instructions and summary information was reviewed with the patient as documented in the AVS. This note was prepared with assistance of Dragon voice recognition software. Occasional wrong-word or sound-a-like substitutions may have occurred due to the inherent limitations of voice recognition software  This visit occurred during the SARS-CoV-2 public health emergency.  Safety protocols were in place, including screening questions prior to the visit, additional usage of staff PPE, and extensive cleaning of exam room while observing appropriate contact time as indicated for disinfecting solutions.

## 2019-07-19 NOTE — Patient Instructions (Signed)
Please return in 3 months for diabetes and blood pressure follow up  I will release your lab results to you on your MyChart account with further instructions. Please reply with any questions.   Try to get the new inhaler, breztri.  i've started farxiga for diabetes.  i've increased your amlodipine dose to 65m daily.  Please set up an appointment for a diabetic eye exam and have the results sent to me.   If you have any questions or concerns, please don't hesitate to send me a message via MyChart or call the office at [phone] Thank you for visiting with [facility]! It's our pleasure caring for you.   Diabetes Mellitus and Nutrition, Adult When you have diabetes (diabetes mellitus), it is very important to have healthy eating habits because your blood sugar (glucose) levels are greatly affected by what you eat and drink. Eating healthy foods in the appropriate amounts, at about the same times every day, can help you:  Control your blood glucose.  Lower your risk of heart disease.  Improve your blood pressure.  Reach or maintain a healthy weight. Every person with diabetes is different, and each person has different needs for a meal plan. Your health care provider may recommend that you work with a diet and nutrition specialist (dietitian) to make a meal plan that is best for you. Your meal plan may vary depending on factors such as:  The calories you need.  The medicines you take.  Your weight.  Your blood glucose, blood pressure, and cholesterol levels.  Your activity level.  Other health conditions you have, such as heart or kidney disease. How do carbohydrates affect me? Carbohydrates, also called carbs, affect your blood glucose level more than any other type of food. Eating carbs naturally raises the amount of glucose in your blood. Carb counting is a method for keeping track of how many carbs you eat. Counting carbs is important to keep your blood glucose at a healthy  level, especially if you use insulin or take certain oral diabetes medicines. It is important to know how many carbs you can safely have in each meal. This is different for every person. Your dietitian can help you calculate how many carbs you should have at each meal and for each snack. Foods that contain carbs include:  Bread, cereal, rice, pasta, and crackers.  Potatoes and corn.  Peas, beans, and lentils.  Milk and yogurt.  Fruit and juice.  Desserts, such as cakes, cookies, ice cream, and candy. How does alcohol affect me? Alcohol can cause a sudden decrease in blood glucose (hypoglycemia), especially if you use insulin or take certain oral diabetes medicines. Hypoglycemia can be a life-threatening condition. Symptoms of hypoglycemia (sleepiness, dizziness, and confusion) are similar to symptoms of having too much alcohol. If your health care provider says that alcohol is safe for you, follow these guidelines:  Limit alcohol intake to no more than 1 drink per day for nonpregnant women and 2 drinks per day for men. One drink equals 12 oz of beer, 5 oz of wine, or 1 oz of hard liquor.  Do not drink on an empty stomach.  Keep yourself hydrated with water, diet soda, or unsweetened iced tea.  Keep in mind that regular soda, juice, and other mixers may contain a lot of sugar and must be counted as carbs. What are tips for following this plan?  Reading food labels  Start by checking the serving size on the "Nutrition Facts" label of  packaged foods and drinks. The amount of calories, carbs, fats, and other nutrients listed on the label is based on one serving of the item. Many items contain more than one serving per package.  Check the total grams (g) of carbs in one serving. You can calculate the number of servings of carbs in one serving by dividing the total carbs by 15. For example, if a food has 30 g of total carbs, it would be equal to 2 servings of carbs.  Check the number of  grams (g) of saturated and trans fats in one serving. Choose foods that have low or no amount of these fats.  Check the number of milligrams (mg) of salt (sodium) in one serving. Most people should limit total sodium intake to less than 2,300 mg per day.  Always check the nutrition information of foods labeled as "low-fat" or "nonfat". These foods may be higher in added sugar or refined carbs and should be avoided.  Talk to your dietitian to identify your daily goals for nutrients listed on the label. Shopping  Avoid buying canned, premade, or processed foods. These foods tend to be high in fat, sodium, and added sugar.  Shop around the outside edge of the grocery store. This includes fresh fruits and vegetables, bulk grains, fresh meats, and fresh dairy. Cooking  Use low-heat cooking methods, such as baking, instead of high-heat cooking methods like deep frying.  Cook using healthy oils, such as olive, canola, or sunflower oil.  Avoid cooking with butter, cream, or high-fat meats. Meal planning  Eat meals and snacks regularly, preferably at the same times every day. Avoid going long periods of time without eating.  Eat foods high in fiber, such as fresh fruits, vegetables, beans, and whole grains. Talk to your dietitian about how many servings of carbs you can eat at each meal.  Eat 4-6 ounces (oz) of lean protein each day, such as lean meat, chicken, fish, eggs, or tofu. One oz of lean protein is equal to: ? 1 oz of meat, chicken, or fish. ? 1 egg. ?  cup of tofu.  Eat some foods each day that contain healthy fats, such as avocado, nuts, seeds, and fish. Lifestyle  Check your blood glucose regularly.  Exercise regularly as told by your health care provider. This may include: ? 150 minutes of moderate-intensity or vigorous-intensity exercise each week. This could be brisk walking, biking, or water aerobics. ? Stretching and doing strength exercises, such as yoga or  weightlifting, at least 2 times a week.  Take medicines as told by your health care provider.  Do not use any products that contain nicotine or tobacco, such as cigarettes and e-cigarettes. If you need help quitting, ask your health care provider.  Work with a Social worker or diabetes educator to identify strategies to manage stress and any emotional and social challenges. Questions to ask a health care provider  Do I need to meet with a diabetes educator?  Do I need to meet with a dietitian?  What number can I call if I have questions?  When are the best times to check my blood glucose? Where to find more information:  American Diabetes Association: diabetes.org  Academy of Nutrition and Dietetics: www.eatright.[employer] of Diabetes and Digestive and Kidney Diseases (NIH): [employer] Summary  A healthy meal plan will help you control your blood glucose and maintain a healthy lifestyle.  Working with a diet and nutrition specialist (dietitian) can help you make a meal  plan that is best for you.  Keep in mind that carbohydrates (carbs) and alcohol have immediate effects on your blood glucose levels. It is important to count carbs and to use alcohol carefully. This information is not intended to replace advice given to you by your health care provider. Make sure you discuss any questions you have with your health care provider. Document Revised: 02/10/2017 Document Reviewed: 04/04/2016 Elsevier Patient Education  2020 [employer].

## 2019-08-01 DIAGNOSIS — B9620 Unspecified Escherichia coli [E. coli] as the cause of diseases classified elsewhere: Secondary | ICD-10-CM | POA: Diagnosis not present

## 2019-08-01 DIAGNOSIS — N8110 Cystocele, unspecified: Secondary | ICD-10-CM | POA: Diagnosis not present

## 2019-08-01 DIAGNOSIS — N390 Urinary tract infection, site not specified: Secondary | ICD-10-CM | POA: Diagnosis not present

## 2019-08-01 DIAGNOSIS — R809 Proteinuria, unspecified: Secondary | ICD-10-CM | POA: Diagnosis not present

## 2019-08-01 DIAGNOSIS — R82998 Other abnormal findings in urine: Secondary | ICD-10-CM | POA: Diagnosis not present

## 2019-08-19 ENCOUNTER — Ambulatory Visit: Payer: Medicare Other | Admitting: Internal Medicine

## 2019-08-19 ENCOUNTER — Other Ambulatory Visit: Payer: Self-pay

## 2019-08-19 ENCOUNTER — Encounter: Payer: Self-pay | Admitting: Internal Medicine

## 2019-08-19 VITALS — BP 130/66 | HR 111 | Temp 98.0°F | Ht 68.0 in | Wt 201.4 lb

## 2019-08-19 DIAGNOSIS — R0609 Other forms of dyspnea: Secondary | ICD-10-CM

## 2019-08-19 DIAGNOSIS — R05 Cough: Secondary | ICD-10-CM

## 2019-08-19 DIAGNOSIS — R053 Chronic cough: Secondary | ICD-10-CM

## 2019-08-19 DIAGNOSIS — R0600 Dyspnea, unspecified: Secondary | ICD-10-CM | POA: Diagnosis not present

## 2019-08-19 DIAGNOSIS — R0902 Hypoxemia: Secondary | ICD-10-CM

## 2019-08-19 NOTE — Progress Notes (Signed)
OV 08/19/2019  Subjective:  Patient ID: Susan Hunt, female , DOB: 11/18/1945 , age 74 y.o. , MRN: [id number] , ADDRESS: [place] [id number]   08/19/2019 -   Chief Complaint  Patient presents with  . Consult    COPD, emphysema.  sob with exertion.  dry coughs alot at night     HPI Susan Hunt 74 y.o. -she is a retired Marine scientist.  She used to work at the [employer] across [place] and having retired from that 10 years ago and then she worked for another agency apparently having retired from all duties few years ago.  Husband is a retired Software engineer.  They are here for shortness of breath.  She tells me that she has had insidious onset of shortness of breath and cough that started 1 year ago and has been progressive.  In the same time she is also been on nitrofurantoin for times.  First 3 of these were 1 week each.  The last 1 being for at least 6 weeks ending approximately 4-6 weeks ago.  Because of progressive symptoms she was given Stiolto but this did not help.  She has been on longstanding Singulair for several years independent of these problems.  This because of allergies.  Therefore she has been referred here.  She did have a chest x-ray that on my personal visualization shows ILD changes.  It is documented below.  She did have a CT abdomen lung images that was reported as normal but I am not so sure from a year ago.  She is a previous smoker.  She is also self-referred herself to [facility] on the basis that she think she might have advanced COPD and is looking for a Zephyr valve.  Symptoms started in August 2020.   [facility] Integrated Comprehensive ILD Questionnaire  Symptoms:   SYMPTOM SCALE - ILD 08/19/2019   O2 use ra  Shortness of Breath 0 -> 5 scale with 5 being worst (score 6 If unable to do)  At rest 1  Simple tasks - showers, clothes change, eating, shaving 2  Household (dishes, doing bed, laundry) 5  Shopping 5  Walking  level at own pace 2  Walking up Stairs 5  Total (30-36) Dyspnea Score 20  How bad is your cough? x  How bad is your fatigue x  How bad is nausea x  How bad is vomiting?  x  How bad is diarrhea? x  How bad is anxiety? x  How bad is depression x    She does have a cough.  She says the cough started in August 2020 the same for shortness of breath started.  She does cough at night.  She does cough when she lies down and it gets worse.  She does feel a tickle in her throat.  Cough does not affect her voice she does not bring any phlegm.  There is no wheezing.  There is no nausea no vomiting no diarrhea.    Past Medical History :   She does have a histo abdomen ry of asthma for the last few years.  Also COPD not otherwise specified for the last few years.  His acid reflux disease for the last few years.  Diabetes for the last few weeks and thyroid disease for the last few years.  Otherwise denies any collagen vascular disease or vasculitis.  Denies any sleep apnea.  Denies pulmonary hypertension.  Denies stroke  denies pneumonias recurrent pneumonias.  Denies heart disease or pleurisy.   ROS: Positive for fatigue for the last several months.  Arthralgia for the last several months.  Dry eyes for the last several months.  With intermittent nausea for the last few years.  T there is daytime sleepiness for the last several months.     FAMILY HISTORY of LUNG DISEASE:  -Denies any pulmonary fibrosis.  Denies COPD denies asthma in the family.  Denies sarcoid denies cystic fibrosis denies hypersensitive pneumonitis.  Denies any autoimmune disease.   EXPOSURE HISTORY:   -She denies any Covid history.  Denies any exposure to Covid.  She did do Covid vaccine.  No reactions after the Covid vaccine.  Did smoke cigarettes 15-year 2000 and 2015 10 to 12 cigarettes a day.  Did not do any passive smoking.  Never did any marijuana.  Elevated electronic cigarettes.  Never use cocaine never used intravenous drug  use.   HOME and HOBBY DETAILS :  [place]  Is lived there for 21 years.  Age of the home is 34 years.  In 2004 there was mold/mildew in the shower curtain.  And then there was mold under the shower.  This was torn down.  This was in 2004 but currently none.  She did some mulch back at work 2 months ago.  No humidifier use no CPAP use.  No nebulizer use.  No steam iron use.  No Jacuzzi use.  No misting Fountain in the house.  No pet birds or parakeets no gerbils.  No feather pillows.  No mold in the [facility] duct.  No music habits.   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  No organic antigen exposure.  Also check for inorganic antigen exposure and this is negative.  Never worked in a dusty environment.  Never exposed to fumes.   PULMONARY TOXICITY HISTORY (27 items): Use nitrofurantoin in 2019 and 2020.  This is described below.  Question if you can for this there is      Simple office walk 185 feet x  3 laps goal with forehead probe 08/19/2019   O2 used ra  Number laps completed Attempted 3 but desaturated at 2  Comments about pace Normal pace  Resting Pulse Ox/HR 100% and 101/min  Final Pulse Ox/HR 86% and 116/min  Desaturated </= 88% yes  Desaturated <= 3% points Yes , 14  Got Tachycardic >/= 90/min yes  Symptoms at end of test dyspneic  Miscellaneous comments Corrected with  2L [place]    Nuclear medicine cardiac stress test March 2016: Overall Impression:  Normal stress nuclear study with a small, mild, fixed apical defect consistent with thinning; no ischemia. LV Wall Motion:  NL LV Function; NL Wall Motion   CT abdomen lung cuts September 2020: Reported as normal.  I personally visualized and retrospect looks hazy and whether this is atelectasis or some other findings I do not know.  Jul 19, 2019:: Personally visualized shows findings of ILD.  I am not able to see a prior chest x-ray in the  system or CT scan of the chest    Results for RENATA, GAMBINO "PAM" (MRN [id number]) as of 08/19/2019 15:18  Ref. Range 03/26/2019 13:45  Creatinine Latest Ref Range: 0.40 - 1.20 mg/dL 1.05   ROS - per HPI Results for NYELLA, ECKELS "PAM" (MRN [id number]) as of 08/19/2019 15:18  Ref. Range 03/26/2019 13:45  Hemoglobin Latest Ref Range: 12.0 - 15.0 g/dL 14.0      has a past medical history of Asthma, Depression, Emphysema of lung ([place]), Hemorrhoids, Hyperlipidemia, Hypertension, Hypothyroidism, Obesity, Rosacea, and Tubular adenoma of colon (03/26/2019).   reports that she quit smoking about 6 years ago. Her smoking use included cigarettes. She has a 7.50 pack-year smoking history. She has never used smokeless tobacco.  Past Surgical History:  Procedure Laterality Date  . ANTERIOR AND POSTERIOR VAGINAL REPAIR  2020  . CATARACT EXTRACTION W/ INTRAOCULAR LENS IMPLANT Bilateral 2016  . DENTAL SURGERY  2016   Dental Implant  . FRACTURE SURGERY Left 2013   Left ankle  . medtronix nerve stimulator  07/18/2019  . NASAL SINUS SURGERY    . VAGINECTOMY, PARTIAL      Allergies  Allergen Reactions  . Beta Adrenergic Blockers Shortness Of Breath    Avoid due to COPD  . Ace Inhibitors   . Clarithromycin     REACTION: GI sx  . Codeine     REACTION: nausea  . Telmisartan     REACTION: sleepy    Immunization History  Administered Date(s) Administered  . Hepatitis A, Adult 04/06/2018  . Influenza Split 01/06/2009  . Influenza, High Dose Seasonal PF 12/17/2014, 12/20/2015, 01/15/2017, 12/18/2017, 11/28/2018  . Influenza, Seasonal, Injecte, Preservative Fre 01/07/2014, 12/17/2014  . Influenza,inj,Quad PF,6+ Mos 01/15/2017  . Influenza,trivalent, recombinat, inj, PF 12/03/2012  . Influenza-Unspecified 01/15/2017  . Moderna SARS-COVID-2 Vaccination 04/17/2019, 05/15/2019  . Pneumococcal Conjugate-13 01/07/2014  . Pneumococcal Polysaccharide-23 11/23/2011  . Td 03/14/2006,  10/16/2011  . Zoster 12/06/2013  . Zoster Recombinat (Shingrix) 10/21/2017, 12/18/2017    Family History  Problem Relation Age of Onset  . Heart disease Mother   . Hypertension Mother   . Osteoporosis Mother   . Stroke Mother   . Alcohol abuse Father   . Diabetes Maternal Grandmother   . Drug abuse Son   . Breast cancer Neg Hx      Current Outpatient Medications:  .  albuterol (PROVENTIL HFA;VENTOLIN HFA) 108 (90 Base) MCG/ACT inhaler, Inhale 2 puffs into the lungs every 4 (four) hours as needed for wheezing or shortness of breath., Disp: 18 g, Rfl: 5 .  amLODipine (NORVASC) 10 MG tablet, Take 1 tablet (10 mg total) by mouth daily., Disp: 90 tablet, Rfl: 3 .  cloNIDine (CATAPRES) 0.1 MG tablet, Take 1 tablet (0.1 mg total) by mouth at bedtime as needed (sleep)., Disp: 30 tablet, Rfl: 1 .  dapagliflozin propanediol (FARXIGA) 5 MG TABS tablet, Take 5 mg by mouth daily before breakfast., Disp: 30 tablet, Rfl: 11 .  fluticasone (FLONASE) 50 MCG/ACT nasal spray, Place 2 sprays into the nose as needed. , Disp: , Rfl:  .  fluticasone (VERAMYST) 27.5 MCG/SPRAY nasal spray, Place into the nose., Disp: , Rfl:  .  levothyroxine (SYNTHROID) 137 MCG tablet, TAKE ONE TABLET BY MOUTH EVERY MORNING BEFORE BREAKFAST, Disp: 90 tablet, Rfl: 2 .  losartan (COZAAR) 100 MG tablet, Take 1 tablet (100 mg total) by mouth daily., Disp: 90 tablet, Rfl: 3 .  montelukast (SINGULAIR) 10 MG tablet, TAKE ONE TABLET BY MOUTH EVERY NIGHT AT BEDTIME, Disp: 90 tablet, Rfl: 1 .  omeprazole (PRILOSEC) 20 MG capsule, TAKE ONE CAPSULE BY MOUTH DAILY, Disp: 90 capsule, Rfl: 0 .  rosuvastatin (CRESTOR) 10 MG tablet, Take 1 tablet (10 mg total) by mouth daily., Disp: 90 tablet, Rfl: 3 .  Tiotropium Bromide-Olodaterol (STIOLTO RESPIMAT) 2.5-2.5 MCG/ACT AERS, Inhale into the lungs.,  Disp: , Rfl:  .  triamterene-hydrochlorothiazide (MAXZIDE-25) 37.5-25 MG tablet, Take 1 tablet by mouth daily., Disp: 90 tablet, Rfl: 3 .   venlafaxine XR (EFFEXOR-XR) 150 MG 24 hr capsule, TAKE ONE CAPSULE BY MOUTH DAILY, Disp: 90 capsule, Rfl: 2 .  aspirin EC 81 MG tablet, Take 1 tablet (81 mg total) by mouth daily. (Patient not taking: Reported on 08/19/2019), Disp: , Rfl:  .  Budeson-Glycopyrrol-Formoterol (BREZTRI AEROSPHERE) 160-9-4.8 MCG/ACT AERO, Inhale 2 puffs into the lungs in the morning and at bedtime. (Patient not taking: Reported on 08/19/2019), Disp: 10.7 g, Rfl: 5 .  meclizine (ANTIVERT) 25 MG tablet, Take 1 tablet (25 mg total) by mouth 3 (three) times daily as needed for dizziness. (Patient not taking: Reported on 08/19/2019), Disp: 30 tablet, Rfl: 0 .  predniSONE (DELTASONE) 20 MG tablet, Take 3 tabs daily for 5 days, Disp: 15 tablet, Rfl: 0      Objective:   Vitals:   08/19/19 1535  BP: 130/66  Pulse: (!) 111  Temp: 98 F (36.7 C)  TempSrc: Oral  SpO2: 93%  Weight: 201 lb 6.4 oz (91.4 kg)  Height: _0  (1.727 m)    Estimated body mass index is 30.62 kg/m as calculated from the following:   Height as of this encounter: _1  (1.727 m).   Weight as of this encounter: 201 lb 6.4 oz (91.4 kg).  _2 @  [employer]   08/19/19 1535  Weight: 201 lb 6.4 oz (91.4 kg)     Physical Exam  General Appearance:    Alert, cooperative, no distress, appears stated age - tes , Deconditioned looking - no , OBESE  - yes, Sitting on Wheelchair -  no  Head:    Normocephalic, without obvious abnormality, atraumatic  Eyes:    PERRL, conjunctiva/corneas clear,  Ears:    Normal TM's and external ear canals, both ears  Nose:   Nares normal, septum midline, mucosa normal, no drainage    or sinus tenderness. OXYGEN ON  - no . Patient is @ ra   Throat:   Lips, mucosa, and tongue normal; teeth and gums normal. Cyanosis on lips - no  Neck:   Supple, symmetrical, trachea midline, no adenopathy;    thyroid:  no enlargement/tenderness/nodules; no carotid   bruit or JVD  Back:     Symmetric, no curvature, ROM normal, no  CVA tenderness  Lungs:     Distress - no , Wheeze no, Barrell Chest - no, Purse lip breathing - no, Crackles - no   Chest Wall:    No tenderness or deformity.    Heart:    Regular rate and rhythm, S1 and S2 normal, no rub   or gallop, Murmur - no  Breast Exam:    NOT DONE  Abdomen:     Soft, non-tender, bowel sounds active all four quadrants,    no masses, no organomegaly. Visceral obesity - ues  Genitalia:   NOT DONE  Rectal:   NOT DONE  Extremities:   Extremities - normal, Has Cane - no, Clubbing - ?, Edema - n  Pulses:   2+ and symmetric all extremities  Skin:   Stigmata of Connective Tissue Disease - no  Lymph nodes:   Cervical, supraclavicular, and axillary nodes normal  Psychiatric:  Neurologic:   Pleasant - yes, Anxious - no, Flat affect - no  CAm-ICU - neg, Alert and Oriented x 3 - yes, Moves all 4s - yes, Speech - normal, Cognition - intact  Assessment:       ICD-10-CM   1. Chronic cough  K09 Basic Metabolic Panel (BMET)    Hepatic function panel    Sedimentation rate    Angiotensin converting enzyme    Antinuclear Antib (ANA)    ANA+ENA+DNA/DS+Antich+Centr    Rheumatoid Factor    Cyclic citrul peptide antibody, IgG    ANCA screen with reflex titer    Mpo/pr-3 (anca) antibodies    MyoMarker 3 Plus Profile (RDL)    CK Total (and CKMB)    Aldolase    Anti-scleroderma antibody    Sjogren's syndrome antibods(ssa + ssb)    Angi-Jo 1 antibody, IgG    Hypersensitivity Pneumonitis    CT Chest High Resolution    Pulmonary Function Test    Pulse oximetry, overnight    CANCELED: CBC  2. Dyspnea on exertion  F81.82 Basic Metabolic Panel (BMET)    Hepatic function panel    Sedimentation rate    Angiotensin converting enzyme    Antinuclear Antib (ANA)    ANA+ENA+DNA/DS+Antich+Centr    Rheumatoid Factor    Cyclic citrul peptide antibody, IgG    ANCA screen with reflex titer    Mpo/pr-3 (anca) antibodies    MyoMarker 3 Plus Profile (RDL)    CK Total (and  CKMB)    Aldolase    Anti-scleroderma antibody    Sjogren's syndrome antibods(ssa + ssb)    Angi-Jo 1 antibody, IgG    Hypersensitivity Pneumonitis    CT Chest High Resolution    Pulmonary Function Test    Pulse oximetry, overnight    CBC w/Diff    IgE    IgE    CBC w/Diff    CANCELED: CBC  3. Exercise hypoxemia  X93.71 Basic Metabolic Panel (BMET)    Hepatic function panel    Sedimentation rate    Angiotensin converting enzyme    Antinuclear Antib (ANA)    ANA+ENA+DNA/DS+Antich+Centr    Rheumatoid Factor    Cyclic citrul peptide antibody, IgG    ANCA screen with reflex titer    Mpo/pr-3 (anca) antibodies    MyoMarker 3 Plus Profile (RDL)    CK Total (and CKMB)    Aldolase    Anti-scleroderma antibody    Sjogren's syndrome antibods(ssa + ssb)    Angi-Jo 1 antibody, IgG    Hypersensitivity Pneumonitis    CT Chest High Resolution    Pulmonary Function Test    Pulse oximetry, overnight    CBC w/Diff    IgE    IgE    CBC w/Diff    CANCELED: CBC   My concern is that she has interstitial lung disease.  Further this is been brought on by nitrofurantoin or flared up by nitrofurantoin and do not know.  She denies any Covid.  Regardless she appears to have progressive symptoms.  The dyspnea cough and the progressive nature and exertional hypoxemia are all consistent with that along with a chest x-ray.   ILD risk exposures include previous smoking,Nitrofurantoin recently in the backyard mulch and acid reflux disease.    Plan:     Patient Instructions     ICD-10-CM   1. Dyspnea on exertion  R06.00   2. Exercise hypoxemia  R09.02   3. Chronic cough  R05     Do blood work 08/19/2019 - Serum: ESR, ACE, ANA, DS-DNA, RF, anti-CCP,  ANCA screen, MPO, PR-3, Total CK,  Aldolase,  scl-70, ssA, ssB, anti-JO-1, Hypersensitivity Pneumonitis Panel  Do Cbc, . BMET, LFT 08/19/2019  Do Blood IgE and do Diff with CBC 08/19/2019   Do HRCT supine and prone inspiratory and expiratory  volume  Do full PFT   Do ONO test room air  Start portable O2   Do ILD questionnaire version 2.0 08/19/2019 and give it to [place] or front desk - takes 30 minutes  [place] appointment   -we can get a second opinion if our work-up ends up being challenging or there is a personal decision to get a second opinion from your side.  Followup  -= face to face with Maycel Riffe or telephone visit next 3 weeks (I opened up some slots last week June/early July 2021)    ( Level 05 visit:  New 60-74 min   in  visit type: on-site physical face to visit  in total care time and counseling or/and coordination of care by this undersigned MD - Dr Brand Males. This includes one or more of the following on this same day 08/19/2019: pre-charting, chart review, note writing, documentation discussion of test results, diagnostic or treatment recommendations, prognosis, risks and benefits of management options, instructions, education, compliance or risk-factor reduction. It excludes time spent by the [employer] or office staff in the care of the patient. Actual time 31 min)    SIGNATURE    Dr. Brand Males, M.D., F.C.C.P,  Pulmonary and Critical Care Medicine Staff Physician, [place] Director - Interstitial Lung Disease  Program  Pulmonary [place] [facility] [place], [place], [address]  Pager: [phone], If no answer or between  15:00h - 7:00h: call [phone] Telephone: [phone]  4:30 PM 08/19/2019

## 2019-08-19 NOTE — Patient Instructions (Addendum)
ICD-10-CM   1. Dyspnea on exertion  R06.00   2. Exercise hypoxemia  R09.02   3. Chronic cough  R05     Do blood work 08/19/2019 - Serum: ESR, ACE, ANA, DS-DNA, RF, anti-CCP,  ANCA screen, MPO, PR-3, Total CK,  Aldolase,  scl-70, ssA, ssB, anti-JO-1, Hypersensitivity Pneumonitis Panel  Do Cbc, . BMET, LFT 08/19/2019   Do Blood IgE and do Diff with CBC 08/19/2019   Do HRCT supine and prone inspiratory and expiratory volume  Do full PFT   Do ONO test room air  Start portable O2  - 2L [place]  Do ILD questionnaire version 2.0 08/19/2019 and give it to [place] or front desk - takes 30 minutes  [place] appointment   -we can get a second opinion if our work-up ends up being challenging or there is a personal decision to get a second opinion from your side.  Followup  -= face to face with Caci Orren or telephone visit next 3 weeks (I opened up some slots last week June/early July 2021)

## 2019-08-19 NOTE — Addendum Note (Signed)
Addended by: Vanessa Barbara on: 08/19/2019 05:40 PM   Modules accepted: Orders

## 2019-08-20 ENCOUNTER — Telehealth: Payer: Self-pay | Admitting: Internal Medicine

## 2019-08-20 DIAGNOSIS — R0600 Dyspnea, unspecified: Secondary | ICD-10-CM

## 2019-08-20 DIAGNOSIS — R0609 Other forms of dyspnea: Secondary | ICD-10-CM

## 2019-08-20 DIAGNOSIS — R0902 Hypoxemia: Secondary | ICD-10-CM

## 2019-08-20 LAB — CBC WITH DIFFERENTIAL/PLATELET
Basophils Absolute: 0.1 10*3/uL (ref 0.0–0.1)
Basophils Relative: 1 % (ref 0.0–3.0)
Eosinophils Absolute: 0.2 10*3/uL (ref 0.0–0.7)
Eosinophils Relative: 1.6 % (ref 0.0–5.0)
HCT: 41.6 % (ref 36.0–46.0)
Hemoglobin: 13.6 g/dL (ref 12.0–15.0)
Lymphocytes Relative: 22.8 % (ref 12.0–46.0)
Lymphs Abs: 2.8 10*3/uL (ref 0.7–4.0)
MCHC: 32.8 g/dL (ref 30.0–36.0)
MCV: 89.8 fl (ref 78.0–100.0)
Monocytes Absolute: 0.9 10*3/uL (ref 0.1–1.0)
Monocytes Relative: 7.3 % (ref 3.0–12.0)
Neutro Abs: 8.3 10*3/uL — ABNORMAL HIGH (ref 1.4–7.7)
Neutrophils Relative %: 67.3 % (ref 43.0–77.0)
Platelets: 344 10*3/uL (ref 150.0–400.0)
RBC: 4.63 Mil/uL (ref 3.87–5.11)
RDW: 13.3 % (ref 11.5–15.5)
WBC: 12.3 10*3/uL — ABNORMAL HIGH (ref 4.0–10.5)

## 2019-08-20 LAB — BASIC METABOLIC PANEL
BUN: 16 mg/dL (ref 6–23)
CO2: 28 mEq/L (ref 19–32)
Calcium: 9.6 mg/dL (ref 8.4–10.5)
Chloride: 99 mEq/L (ref 96–112)
Creatinine, Ser: 1.22 mg/dL — ABNORMAL HIGH (ref 0.40–1.20)
GFR: 42.99 mL/min — ABNORMAL LOW (ref >60.00)
Glucose, Bld: 162 mg/dL — ABNORMAL HIGH (ref 70–99)
Potassium: 3.4 mEq/L — ABNORMAL LOW (ref 3.5–5.1)
Sodium: 135 mEq/L (ref 135–145)

## 2019-08-20 LAB — HEPATIC FUNCTION PANEL
ALT: 34 U/L (ref 0–35)
AST: 55 U/L — ABNORMAL HIGH (ref 0–37)
Albumin: 4.5 g/dL (ref 3.5–5.2)
Alkaline Phosphatase: 81 U/L (ref 39–117)
Bilirubin, Direct: 0.2 mg/dL (ref 0.0–0.3)
Total Bilirubin: 0.7 mg/dL (ref 0.2–1.2)
Total Protein: 7.8 g/dL (ref 6.0–8.3)

## 2019-08-20 LAB — SEDIMENTATION RATE: Sed Rate: 42 mm/hr — ABNORMAL HIGH (ref 0–30)

## 2019-08-20 NOTE — Telephone Encounter (Signed)
New ONO order placed. New O2 order placed.  Nothing further needed at this time- will close encounter.

## 2019-08-20 NOTE — Telephone Encounter (Signed)
Also, there was an O2 order placed.  [facility] is requesting the following be added to the O2 order:  Skeet Latch sent to Gr[facility]er Lanna Poche!   Also for this one can you have continuous added to the Order instead of just "portable POC".   Thanks for your help!

## 2019-08-22 DIAGNOSIS — J449 Chronic obstructive pulmonary disease, unspecified: Secondary | ICD-10-CM | POA: Diagnosis not present

## 2019-08-22 LAB — ANTI-NUCLEAR AB-TITER (ANA TITER): ANA Titer 1: 1:80 {titer} — ABNORMAL HIGH

## 2019-08-22 LAB — IGE: IgE (Immunoglobulin E), Serum: 333 kU/L — ABNORMAL HIGH (ref <=114)

## 2019-08-22 LAB — MPO/PR-3 (ANCA) ANTIBODIES
Myeloperoxidase Abs: <1 AI
Serine Protease 3: <1 AI

## 2019-08-22 LAB — ANCA SCREEN W REFLEX TITER: ANCA Screen: NEGATIVE

## 2019-08-22 LAB — CK TOTAL AND CKMB (NOT AT ARMC)
CK, MB: 1 ng/mL (ref 0–5.0)
Relative Index: 1.1 (ref 0–4.0)
Total CK: 93 U/L (ref 29–143)

## 2019-08-22 LAB — ANA: Anti Nuclear Antibody (ANA): POSITIVE — AB

## 2019-08-22 LAB — ANGIOTENSIN CONVERTING ENZYME: Angiotensin-Converting Enzyme: 35 U/L (ref 9–67)

## 2019-08-22 LAB — CYCLIC CITRUL PEPTIDE ANTIBODY, IGG: Cyclic Citrullin Peptide Ab: <16 UNITS

## 2019-08-22 LAB — RHEUMATOID FACTOR: Rheumatoid fact SerPl-aCnc: <14 IU/mL (ref <14)

## 2019-08-22 LAB — ALDOLASE: Aldolase: 6.7 U/L (ref <=8.1)

## 2019-08-22 LAB — SJOGREN'S SYNDROME ANTIBODS(SSA + SSB)
SSA (Ro) (ENA) Antibody, IgG: <1 AI
SSB (La) (ENA) Antibody, IgG: <1 AI

## 2019-08-22 LAB — ANTI-SCLERODERMA ANTIBODY: Scleroderma (Scl-70) (ENA) Antibody, IgG: <1 AI

## 2019-08-23 ENCOUNTER — Ambulatory Visit (INDEPENDENT_AMBULATORY_CARE_PROVIDER_SITE_OTHER): Payer: Medicare Other | Admitting: Internal Medicine

## 2019-08-23 ENCOUNTER — Encounter: Payer: Self-pay | Admitting: Family Medicine

## 2019-08-23 ENCOUNTER — Other Ambulatory Visit: Payer: Self-pay

## 2019-08-23 DIAGNOSIS — R0600 Dyspnea, unspecified: Secondary | ICD-10-CM | POA: Diagnosis not present

## 2019-08-23 DIAGNOSIS — R053 Chronic cough: Secondary | ICD-10-CM

## 2019-08-23 DIAGNOSIS — R0902 Hypoxemia: Secondary | ICD-10-CM

## 2019-08-23 DIAGNOSIS — R0609 Other forms of dyspnea: Secondary | ICD-10-CM

## 2019-08-23 LAB — PULMONARY FUNCTION TEST
DL/VA % pred: 88 %
DL/VA: 3.55 ml/min/mmHg/L
DLCO cor % pred: 64 %
DLCO cor: 13.76 ml/min/mmHg
DLCO unc % pred: 65 %
DLCO unc: 13.84 ml/min/mmHg
FEF 25-75 Post: 3.74 L/sec
FEF 25-75 Pre: 2.87 L/sec
FEF2575-%Change-Post: 30 %
FEF2575-%Pred-Post: 198 %
FEF2575-%Pred-Pre: 152 %
FEV1-%Change-Post: 4 %
FEV1-%Pred-Post: 96 %
FEV1-%Pred-Pre: 92 %
FEV1-Post: 2.36 L
FEV1-Pre: 2.26 L
FEV1FVC-%Change-Post: 6 %
FEV1FVC-%Pred-Pre: 115 %
FEV6-%Change-Post: 0 %
FEV6-%Pred-Post: 83 %
FEV6-%Pred-Pre: 83 %
FEV6-Post: 2.57 L
FEV6-Pre: 2.59 L
FEV6FVC-%Pred-Post: 104 %
FEV6FVC-%Pred-Pre: 104 %
FVC-%Change-Post: -1 %
FVC-%Pred-Post: 79 %
FVC-%Pred-Pre: 80 %
FVC-Post: 2.57 L
FVC-Pre: 2.61 L
Post FEV1/FVC ratio: 92 %
Post FEV6/FVC ratio: 100 %
Pre FEV1/FVC ratio: 87 %
Pre FEV6/FVC Ratio: 100 %
RV % pred: 63 %
RV: 1.53 L
TLC % pred: 75 %
TLC: 4.15 L

## 2019-08-23 LAB — ANA+ENA+DNA/DS+ANTICH+CENTR
ANA Titer 1: NEGATIVE
Anti JO-1: <0.2 AI (ref 0.0–0.9)
Centromere Ab Screen: <0.2 AI (ref 0.0–0.9)
Chromatin Ab SerPl-aCnc: <0.2 AI (ref 0.0–0.9)
ENA RNP Ab: 0.6 AI (ref 0.0–0.9)
ENA SM Ab Ser-aCnc: <0.2 AI (ref 0.0–0.9)
ENA SSA (RO) Ab: <0.2 AI (ref 0.0–0.9)
ENA SSB (LA) Ab: <0.2 AI (ref 0.0–0.9)
Scleroderma (Scl-70) (ENA) Antibody, IgG: <0.2 AI (ref 0.0–0.9)
dsDNA Ab: 5 IU/mL (ref 0–9)

## 2019-08-23 LAB — HYPERSENSITIVITY PNEUMONITIS
A. Pullulans Abs: NEGATIVE
A.Fumigatus #1 Abs: NEGATIVE
Micropolyspora faeni, IgG: NEGATIVE
Pigeon Serum Abs: NEGATIVE
Thermoact. Saccharii: NEGATIVE
Thermoactinomyces vulgaris, IgG: NEGATIVE

## 2019-08-23 NOTE — Progress Notes (Signed)
PFT done today. 

## 2019-09-04 ENCOUNTER — Other Ambulatory Visit: Payer: Self-pay

## 2019-09-04 ENCOUNTER — Ambulatory Visit
Admission: RE | Admit: 2019-09-04 | Discharge: 2019-09-04 | Disposition: A | Discharge status: Home / Self Care | Payer: Medicare Other | Source: Ambulatory Visit | Attending: Internal Medicine | Admitting: Internal Medicine

## 2019-09-04 DIAGNOSIS — R053 Chronic cough: Secondary | ICD-10-CM

## 2019-09-04 DIAGNOSIS — J8410 Pulmonary fibrosis, unspecified: Secondary | ICD-10-CM | POA: Diagnosis not present

## 2019-09-04 DIAGNOSIS — J479 Bronchiectasis, uncomplicated: Secondary | ICD-10-CM | POA: Diagnosis not present

## 2019-09-04 DIAGNOSIS — R0600 Dyspnea, unspecified: Secondary | ICD-10-CM

## 2019-09-04 DIAGNOSIS — R0902 Hypoxemia: Secondary | ICD-10-CM

## 2019-09-04 DIAGNOSIS — R0609 Other forms of dyspnea: Secondary | ICD-10-CM

## 2019-09-06 LAB — MYOMARKER 3 PLUS PROFILE (RDL)

## 2019-09-08 NOTE — Progress Notes (Signed)
There is ild on ct chst. Blood work - IgE mildle elevated and ANA - trace positive 1:80. . Will discuss at followup

## 2019-09-10 ENCOUNTER — Encounter: Payer: Self-pay | Admitting: Internal Medicine

## 2019-09-10 ENCOUNTER — Ambulatory Visit: Payer: Medicare Other | Admitting: Internal Medicine

## 2019-09-10 ENCOUNTER — Other Ambulatory Visit: Payer: Self-pay

## 2019-09-10 ENCOUNTER — Telehealth: Payer: Self-pay | Admitting: Internal Medicine

## 2019-09-10 VITALS — BP 140/72 | HR 74 | Temp 97.8°F | Ht 68.0 in | Wt 200.4 lb

## 2019-09-10 DIAGNOSIS — R0609 Other forms of dyspnea: Secondary | ICD-10-CM

## 2019-09-10 DIAGNOSIS — J849 Interstitial pulmonary disease, unspecified: Secondary | ICD-10-CM | POA: Diagnosis not present

## 2019-09-10 DIAGNOSIS — R0600 Dyspnea, unspecified: Secondary | ICD-10-CM | POA: Diagnosis not present

## 2019-09-10 DIAGNOSIS — R0902 Hypoxemia: Secondary | ICD-10-CM

## 2019-09-10 NOTE — Telephone Encounter (Signed)
Susan Hunt "Susan Hunt" Female, 74 y.o., 1945/12/01 MRN: [id number]  [name]  Sending this nice lady to you. She worked at [employer] in [place]. She has ILD. Suspect HP but not sure. CT is not c/w any etiology.  ? needs EBUS for mediastinal nodes. Also, before biopsy if you can do lavage for cell count and culture will be great  Thanks  MR  PS: also emailed you

## 2019-09-10 NOTE — Progress Notes (Signed)
OV 08/19/2019  Subjective:  Patient ID: Susan Hunt, female , DOB: 1946/03/13 , age 74 y.o. , MRN: [id number] , ADDRESS: [name]   08/19/2019 -   Chief Complaint  Patient presents with  . Consult    COPD, emphysema.  sob with exertion.  dry coughs alot at night     HPI Susan Hunt 74 y.o. -she is a retired Marine scientist.  She used to work at the [employer] across [place] and having retired from that 10 years ago and then she worked for another agency apparently having retired from all duties few years ago.  Husband is a retired Software engineer.  They are here for shortness of breath.  She tells me that she has had insidious onset of shortness of breath and cough that started 1 year ago and has been progressive.  In the same time she is also been on nitrofurantoin for times.  First 3 of these were 1 week each.  The last 1 being for at least 6 weeks ending approximately 4-6 weeks ago.  Because of progressive symptoms she was given Stiolto but this did not help.  She has been on longstanding Singulair for several years independent of these problems.  This because of allergies.  Therefore she has been referred here.  She did have a chest x-ray that on my personal visualization shows ILD changes.  It is documented below.  She did have a CT abdomen lung images that was reported as normal but I am not so sure from a year ago.  She is a previous smoker.  She is also self-referred herself to [facility] on the basis that she think she might have advanced COPD and is looking for a Zephyr valve.  Symptoms started in August 2020.   [name]  Symptoms:   SYMPTOM SCALE - ILD 08/19/2019   O2 use ra  Shortness of Breath 0 -> 5 scale with 5 being worst (score 6 If unable to do)  At rest 1  Simple tasks - showers, clothes change, eating, shaving 2  Household (dishes, doing bed, laundry) 5  Shopping 5  Walking  level at own pace 2  Walking up Stairs 5  Total (30-36) Dyspnea Score 20  How bad is your cough? x  How bad is your fatigue x  How bad is nausea x  How bad is vomiting?  x  How bad is diarrhea? x  How bad is anxiety? x  How bad is depression x    She does have a cough.  She says the cough started in August 2020 the same for shortness of breath started.  She does cough at night.  She does cough when she lies down and it gets worse.  She does feel a tickle in her throat.  Cough does not affect her voice she does not bring any phlegm.  There is no wheezing.  There is no nausea no vomiting no diarrhea.    Past Medical History :   She does have a histo abdomen ry of asthma for the last few years.  Also COPD not otherwise specified for the last few years.  His acid reflux disease for the last few years.  Diabetes for the last few weeks and thyroid disease for the last few years.  Otherwise denies any collagen vascular disease or vasculitis.  Denies any sleep apnea.  Denies pulmonary hypertension.  Denies stroke  denies pneumonias recurrent pneumonias.  Denies heart disease or pleurisy.   ROS: Positive for fatigue for the last several months.  Arthralgia for the last several months.  Dry eyes for the last several months.  With intermittent nausea for the last few years.  T there is daytime sleepiness for the last several months.     FAMILY HISTORY of LUNG DISEASE:  -Denies any pulmonary fibrosis.  Denies COPD denies asthma in the family.  Denies sarcoid denies cystic fibrosis denies hypersensitive pneumonitis.  Denies any autoimmune disease.   EXPOSURE HISTORY:   -She denies any Covid history.  Denies any exposure to Covid.  She did do Covid vaccine.  No reactions after the Covid vaccine.  Did smoke cigarettes 15-year 2000 and 2015 10 to 12 cigarettes a day.  Did not do any passive smoking.  Never did any marijuana.  Elevated electronic cigarettes.  Never use cocaine never used intravenous drug  use.   HOME and HOBBY DETAILS :  -[place]  Is lived there for 21 years.  Age of the home is 34 years.  In 2004 there was mold/mildew in the shower curtain.  And then there was mold under the shower.  This was torn down.  This was in 2004 but currently none.  She did some mulch back at work 2 months ago.  No humidifier use no CPAP use.  No nebulizer use.  No steam iron use.  No Jacuzzi use.  No misting Fountain in the house.  No pet birds or parakeets no gerbils.  No feather pillows.  No mold in the [facility] duct.  No music habits.   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  No otehrr organic antigen exposure that she recollects (MR side note: a different patient in 2016 reported that in 2013-2016 time lot of renovations there and mold +).  Patient reported on September 10, 2019: That between 1966 and 1980 she worked at [employer] and [place].  At that time there is lot of mold exposure in the building.  The building was cleaned out.  Also checked for inorganic antigen exposure and this is negative.  Never worked in a dusty environment.  Never exposed to fumes.   PULMONARY TOXICITY HISTORY (27 items): Use nitrofurantoin in 2019 and 2020.  This is described below.  Question if you can for this there is      Simple office walk 185 feet x  3 laps goal with forehead probe 08/19/2019   O2 used ra  Number laps completed Attempted 3 but desaturated at 2  Comments about pace Normal pace  Resting Pulse Ox/HR 100% and 101/min  Final Pulse Ox/HR 86% and 116/min  Desaturated </= 88% yes  Desaturated <= 3% points Yes , 14  Got Tachycardic >/= 90/min yes  Symptoms at end of test dyspneic  Miscellaneous comments Corrected with  2L [name]    Nuclear medicine cardiac stress test March 2016: Overall Impression:  Normal stress nuclear study with a small, mild,  fixed apical defect consistent with thinning; no ischemia. LV Wall Motion:  NL LV Function; NL Wall Motion   CT abdomen lung cuts September 2020: Reported as normal.  I personally visualized and retrospect looks hazy and whether this is atelectasis or some other findings I do not know.  Jul 19, 2019:: Personally visualized shows findings of ILD.  I am not able to see a prior chest x-ray in the system or CT scan of the chest    Results for Susan Hunt, Susan "PAM" (MRN [id number]) as of 08/19/2019 15:18  Ref. Range 03/26/2019 13:45  Creatinine Latest Ref Range: 0.40 - 1.20 mg/dL 1.05   ROS - per HPI Results for Susan Hunt, Susan "PAM" (MRN [id number]) as of 08/19/2019 15:18  Ref. Range 03/26/2019 13:45  Hemoglobin Latest Ref Range: 12.0 - 15.0 g/dL 14.0     OV 09/10/2019  Subjective:  Patient ID: Susan Hunt, female , DOB: 06/30/1945 , age 75 y.o. , MRN: [id number] , ADDRESS: [place] [id number]   09/10/2019 -   Chief Complaint  Patient presents with  . Follow-up    shortness of breath with exertion and non-productive cough     HPI Susan Hunt 74 y.o. -presents for follow-up to discuss results of dyspnea work-up.  High-resolution CT chest was personally visualized.  It shows presence of interstitial lung disease.  It needs Fleischner criteria for diagnosis not consistent with UIP.  Per ATS this is alternative diagnosis to UIP.  Radiologist raised the concern of hypersensitive pneumonitis but there is no air-trapping.  The other possibilities NSIP.  She is no longer on nitrofurantoin.  In any event unit she only took it intermittently.  Overall she is stable since last visit.  She had autoimmune profile.  Is only trace positive for ANA.  Her IgE is slightly elevated.  Her PFT shows mild restriction with reduction in diffusion capacity.  We went over her exposure history again.  This time she did recollect being exposed to mold for 15 to 20 years up until 1980  while living in [place] and working at [employer].  She does not recollect mold at behavioral [facility] where she worked recently until she retired.  However another patient of mine did recollect mold in this building at [employer].   Low risk cardiac stress test in 2016 but with ejection fraction 54%.-Since then no overall change in health.  Husband inquired about the low ejection fraction.  In terms of symptoms: She is try to get portable oxygen system.  She is switching company to [employer] because of the shortage of portable oxygen systems at [facility].    SYMPTOM SCALE - ILD 08/19/2019   O2 use ra  Shortness of Breath 0 -> 5 scale with 5 being worst (score 6 If unable to do)  At rest 1  Simple tasks - showers, clothes change, eating, shaving 2  Household (dishes, doing bed, laundry) 5  Shopping 5  Walking level at own pace 2  Walking up Stairs 5  Total (30-36) Dyspnea Score 20  How bad is your cough? x  How bad is your fatigue x  How bad is nausea x  How bad is vomiting?  x  How bad is diarrhea? x  How bad is anxiety? x  How bad is depression x     Simple office walk 185 feet x  3 laps goal with forehead probe 08/19/2019   O2 used ra  Number laps completed Attempted 3 but desaturated at 2  Comments about pace Normal pace  Resting Pulse Ox/HR 100% and 101/min  Final Pulse Ox/HR 86% and 116/min  Desaturated </= 88% yes  Desaturated <= 3% points Yes , 14  Got Tachycardic >/= 90/min yes  Symptoms at end of test dyspneic  Miscellaneous comments Corrected with  2L [place]  ROS - per HPI  xxxxxxxxxxxxxxxxxxxxxxxxxx Results for Susan Hunt, Susan "PAM" (MRN [id number]) as of 09/10/2019 13:56  Ref. Range 08/23/2019 10:53  FVC-Pre Latest Units: L 2.61  FVC-%Pred-Pre Latest Units: % 80  FEV1-Pre Latest Units: L 2.26  FEV1-%Pred-Pre Latest Units: % 92  Results for Susan Hunt, Susan "PAM" (MRN [id number]) as of 09/10/2019 13:56  Ref. Range 08/23/2019 10:53  DLCO cor  Latest Units: ml/min/mmHg 13.76  DLCO cor % pred Latest Units: % 64   Results for Susan Hunt, Susan "PAM" (MRN [id number]) as of 09/10/2019 13:56  Ref. Range 08/23/2019 10:53  TLC Latest Units: L 4.15  TLC % pred Latest Units: % 75   xxxxxxxxxxxxxxxxxxxxxx  CT chest high resolution June 2021 Lungs/Pleura: Mild pulmonary fibrosis in a pattern with apical predominance, featuring irregular peripheral interstitial opacity and scattered areas of subpleural bronchiolectasis. Mild, tubular bronchiectasis throughout. No significant air trapping on expiratory phase imaging. No pleural effusion or pneumothorax.  Upper Abdomen: No acute abnormality.  Musculoskeletal: No chest wall mass or suspicious bone lesions identified.  IMPRESSION: 1. Mild pulmonary fibrosis in a pattern with apical predominance, featuring irregular peripheral interstitial opacity and scattered areas of subpleural bronchiolectasis. Mild, tubular bronchiectasis throughout. Findings are most consistent with an "alternative diagnosis" pattern by ATS pulmonary fibrosis criteria, leading differential considerations chronic fibrotic hypersensitivity pneumonitis or NSIP. 2. Prominent mediastinal lymph nodes, nonspecific and likely reactive. 3. Coronary artery disease.  Aortic Atherosclerosis (ICD10-I70.0).   Electronically Signed   By: Eddie Candle M.D.   On: 09/04/2019 15:37    xxxxxxxxxxxxxxxxxxxxxxxx[id number]  Results for Susan Hunt, Susan "PAM" (MRN [id number]) as of 09/10/2019 13:56  Ref. Range 08/19/2019 16:30 08/19/2019 16:30  SEE BELOW Unknown  Comment  Anti Nuclear Antibody (ANA) Latest Ref Range: NEGATIVE  POSITIVE (A)   ANA Pattern 1 Unknown Cytoplasmic (A)   ANA Titer 1 Unknown 1:80 (H) Negative  Angiotensin-Converting Enzyme Latest Ref Range: 9 - 67 U/L 35   Anti JO-1 Latest Ref Range: 0.0 - 0.9 AI  <0.2  CENTROMERE AB SCREEN Latest Ref Range: 0.0 - 0.9 AI  <7.5  Cyclic Citrullin  Peptide Ab Latest Units: UNITS <16   dsDNA Ab Latest Ref Range: 0 - 9 IU/mL  5  ENA RNP Ab Latest Ref Range: 0.0 - 0.9 AI  0.6  ENA SSA (RO) Ab Latest Ref Range: 0.0 - 0.9 AI  <0.2  ENA SSB (LA) Ab Latest Ref Range: 0.0 - 0.9 AI  <0.2  Myeloperoxidase Abs Latest Units: AI <1.0   Serine Protease 3 Latest Units: AI <1.0   RA Latex Turbid. Latest Ref Range: <14 IU/mL <14   ENA SM Ab Ser-aCnc Latest Ref Range: 0.0 - 0.9 AI  <0.2  Chromatin Ab SerPl-aCnc Latest Ref Range: 0.0 - 0.9 AI  <0.2     has a past medical history of Asthma, Depression, Emphysema of lung ([place]), Hemorrhoids, Hyperlipidemia, Hypertension, Hypothyroidism, Obesity, Rosacea, and Tubular adenoma of colon (03/26/2019).   reports that she quit smoking about 6 years ago. Her smoking use included cigarettes. She has a 7.50 pack-year smoking history. She has never used smokeless tobacco.  Past Surgical History:  Procedure Laterality Date  . ANTERIOR AND POSTERIOR VAGINAL REPAIR  2020  . CATARACT EXTRACTION W/ INTRAOCULAR LENS IMPLANT Bilateral 2016  . DENTAL SURGERY  2016   Dental Implant  . FRACTURE SURGERY Left 2013   Left ankle  . medtronix nerve stimulator  07/18/2019  . NASAL SINUS SURGERY    . VAGINECTOMY, PARTIAL  Allergies  Allergen Reactions  . Beta Adrenergic Blockers Shortness Of Breath    Avoid due to COPD  . Ace Inhibitors   . Clarithromycin     REACTION: GI sx  . Codeine     REACTION: nausea  . Telmisartan     REACTION: sleepy    Immunization History  Administered Date(s) Administered  . Hepatitis A, Adult 04/06/2018  . Influenza Split 01/06/2009  . Influenza, High Dose Seasonal PF 12/17/2014, 12/20/2015, 01/15/2017, 12/18/2017, 11/28/2018  . Influenza, Seasonal, Injecte, Preservative Fre 01/07/2014, 12/17/2014  . Influenza,inj,Quad PF,6+ Mos 01/15/2017  . Influenza,trivalent, recombinat, inj, PF 12/03/2012  . Influenza-Unspecified 01/15/2017  . Moderna SARS-COVID-2 Vaccination 04/17/2019,  05/15/2019  . Pneumococcal Conjugate-13 01/07/2014  . Pneumococcal Polysaccharide-23 11/23/2011  . Td 03/14/2006, 10/16/2011  . Zoster 12/06/2013  . Zoster Recombinat (Shingrix) 10/21/2017, 12/18/2017    Family History  Problem Relation Age of Onset  . Heart disease Mother   . Hypertension Mother   . Osteoporosis Mother   . Stroke Mother   . Alcohol abuse Father   . Diabetes Maternal Grandmother   . Drug abuse Son   . Breast cancer Neg Hx      Current Outpatient Medications:  .  albuterol (PROVENTIL HFA;VENTOLIN HFA) 108 (90 Base) MCG/ACT inhaler, Inhale 2 puffs into the lungs every 4 (four) hours as needed for wheezing or shortness of breath., Disp: 18 g, Rfl: 5 .  amLODipine (NORVASC) 10 MG tablet, Take 1 tablet (10 mg total) by mouth daily., Disp: 90 tablet, Rfl: 3 .  aspirin EC 81 MG tablet, Take 1 tablet (81 mg total) by mouth daily., Disp: , Rfl:  .  Budeson-Glycopyrrol-Formoterol (BREZTRI AEROSPHERE) 160-9-4.8 MCG/ACT AERO, Inhale 2 puffs into the lungs in the morning and at bedtime., Disp: 10.7 g, Rfl: 5 .  cloNIDine (CATAPRES) 0.1 MG tablet, Take 1 tablet (0.1 mg total) by mouth at bedtime as needed (sleep)., Disp: 30 tablet, Rfl: 1 .  dapagliflozin propanediol (FARXIGA) 5 MG TABS tablet, Take 5 mg by mouth daily before breakfast., Disp: 30 tablet, Rfl: 11 .  fluticasone (FLONASE) 50 MCG/ACT nasal spray, Place 2 sprays into the nose as needed. , Disp: , Rfl:  .  fluticasone (VERAMYST) 27.5 MCG/SPRAY nasal spray, Place into the nose., Disp: , Rfl:  .  levothyroxine (SYNTHROID) 137 MCG tablet, TAKE ONE TABLET BY MOUTH EVERY MORNING BEFORE BREAKFAST, Disp: 90 tablet, Rfl: 2 .  losartan (COZAAR) 100 MG tablet, Take 1 tablet (100 mg total) by mouth daily., Disp: 90 tablet, Rfl: 3 .  meclizine (ANTIVERT) 25 MG tablet, Take 1 tablet (25 mg total) by mouth 3 (three) times daily as needed for dizziness., Disp: 30 tablet, Rfl: 0 .  montelukast (SINGULAIR) 10 MG tablet, TAKE ONE TABLET  BY MOUTH EVERY NIGHT AT BEDTIME, Disp: 90 tablet, Rfl: 1 .  omeprazole (PRILOSEC) 20 MG capsule, TAKE ONE CAPSULE BY MOUTH DAILY, Disp: 90 capsule, Rfl: 0 .  rosuvastatin (CRESTOR) 10 MG tablet, Take 1 tablet (10 mg total) by mouth daily., Disp: 90 tablet, Rfl: 3 .  Tiotropium Bromide-Olodaterol (STIOLTO RESPIMAT) 2.5-2.5 MCG/ACT AERS, Inhale into the lungs., Disp: , Rfl:  .  triamterene-hydrochlorothiazide (MAXZIDE-25) 37.5-25 MG tablet, Take 1 tablet by mouth daily., Disp: 90 tablet, Rfl: 3 .  venlafaxine XR (EFFEXOR-XR) 150 MG 24 hr capsule, TAKE ONE CAPSULE BY MOUTH DAILY, Disp: 90 capsule, Rfl: 2      Objective:   Vitals:   09/10/19 1354  BP: 140/72  Pulse: 74  Temp: 97.8 F (36.6 C)  TempSrc: Oral  SpO2: 97%  Weight: 200 lb 6.4 oz (90.9 kg)  Height: _0  (1.727 m)    Estimated body mass index is 30.47 kg/m as calculated from the following:   Height as of this encounter: _1  (1.727 m).   Weight as of this encounter: 200 lb 6.4 oz (90.9 kg).  _2 @  [employer]   09/10/19 1354  Weight: 200 lb 6.4 oz (90.9 kg)     Physical Exam Discussion only visit       Assessment:       ICD-10-CM   1. Dyspnea on exertion  R06.00 Ambulatory Referral for DME    Ambulatory referral to Cardiothoracic Surgery    ECHOCARDIOGRAM COMPLETE  2. Exercise hypoxemia  R09.02 Ambulatory Referral for DME    Ambulatory referral to Cardiothoracic Surgery    ECHOCARDIOGRAM COMPLETE  3. ILD (interstitial lung disease) ([place])  J84.9 Ambulatory Referral for DME    Ambulatory referral to Cardiothoracic Surgery    ECHOCARDIOGRAM COMPLETE   Concern is interstitial lung disease that is not UIP.  CT scan is suggesting alternative diagnosis.  Biopsy is indicated.  We went over the possibility of doing bronchoscopy with lavage and transbronchial biopsies.  There is a low yield procedure.  Given otherwise mild ILD with only exertional desaturations.  She might be a better candidate for  surgical lung biopsy which is gold standard.  I will make a referral to Dr. Roxan Hockey.  I have written to him about this.  In addition I will request that he do a lavage for cultures and cell count when he does the biopsy.  From a pulmonology perspective I did discuss the risks, benefits and limitations of surgical lung biopsy  Given EF 55% we will get echocardiogram as well.    Plan:     Patient Instructions      ICD-10-CM   1. Dyspnea on exertion  R06.00   2. Exercise hypoxemia  R09.02   3. ILD (interstitial lung disease) ([place])  J84.9     Get echocardiogram  Refer Dr. Erasmo Leventhal   -for surgical lung biopsy and also bronchoscopy with lavage for cell count and cultures  Change to INNOGENT  for portable oxygen system  Followup -Mid August 2021 to discuss biopsy report 30-minute face-to-face visit  (Level 04: Estb 30-39 min   visit type: on-site physical face to visit visit spent in total care time and counseling or/and coordination of care by this undersigned MD - Dr Brand Males. This includes one or more of the following on this same day 09/10/2019: pre-charting, chart review, note writing, documentation discussion of test results, diagnostic or treatment recommendations, prognosis, risks and benefits of management options, instructions, education, compliance or risk-factor reduction. It excludes time spent by the [place] or office staff in the care of the patient . Actual time is 30 min)    SIGNATURE    Dr. Brand Males, M.D., F.C.C.P,  Pulmonary and Critical Care Medicine Staff Physician, [place] Director - Interstitial Lung Disease  Program  [facility] [place] [facility] [place], [place], [address]  Pager: [phone], If no answer or between  15:00h - 7:00h: call [phone] Telephone: [phone]  3:14 PM 09/10/2019

## 2019-09-10 NOTE — Patient Instructions (Addendum)
ICD-10-CM   1. Dyspnea on exertion  R06.00   2. Exercise hypoxemia  R09.02   3. ILD (interstitial lung disease) [facility])  J84.9     Get echocardiogram  Refer Dr. Erasmo Leventhal   -for surgical lung biopsy and also bronchoscopy with lavage for cell count and cultures  Change to INNOGENT  for portable oxygen system  Followup -Mid August 2021 to discuss biopsy report 30-minute face-to-face visit

## 2019-09-11 NOTE — Telephone Encounter (Signed)
I called Susan Hunt but there wa sno answer. LM to call back. We just need a fax number to fax the order.

## 2019-09-13 ENCOUNTER — Ambulatory Visit (HOSPITAL_COMMUNITY): Payer: Medicare Other | Attending: Cardiology

## 2019-09-13 ENCOUNTER — Other Ambulatory Visit: Payer: Self-pay

## 2019-09-13 DIAGNOSIS — R0902 Hypoxemia: Secondary | ICD-10-CM

## 2019-09-13 DIAGNOSIS — R0600 Dyspnea, unspecified: Secondary | ICD-10-CM | POA: Insufficient documentation

## 2019-09-13 DIAGNOSIS — R0609 Other forms of dyspnea: Secondary | ICD-10-CM

## 2019-09-13 DIAGNOSIS — J849 Interstitial pulmonary disease, unspecified: Secondary | ICD-10-CM | POA: Diagnosis not present

## 2019-09-20 DIAGNOSIS — M109 Gout, unspecified: Secondary | ICD-10-CM | POA: Diagnosis not present

## 2019-09-21 DIAGNOSIS — J449 Chronic obstructive pulmonary disease, unspecified: Secondary | ICD-10-CM | POA: Diagnosis not present

## 2019-09-25 ENCOUNTER — Encounter: Payer: Medicare Other | Admitting: Thoracic Surgery (Cardiothoracic Vascular Surgery)

## 2019-10-03 ENCOUNTER — Other Ambulatory Visit: Payer: Self-pay

## 2019-10-03 ENCOUNTER — Institutional Professional Consult (permissible substitution): Payer: Medicare Other | Admitting: Thoracic Surgery (Cardiothoracic Vascular Surgery)

## 2019-10-03 VITALS — BP 128/73 | HR 91 | Temp 97.3°F | Resp 24 | Ht 67.0 in | Wt 203.0 lb

## 2019-10-03 DIAGNOSIS — J849 Interstitial pulmonary disease, unspecified: Secondary | ICD-10-CM | POA: Diagnosis not present

## 2019-10-03 NOTE — Progress Notes (Signed)
PCP is Leamon Arnt, MD Referring Provider is Brand Males, MD  Chief Complaint  Patient presents with  . Consult    Chest CT 09/04/19, PFT 08/23/19    HPI: Susan Hunt is sent for consultation for possible lung biopsy.  Susan Hunt is a 74 year old woman with history of hypertension, hyperlipidemia, asthma, obesity, hypothyroidism, and depression.  She has been having problems with shortness of breath dating back about a year.  She had been placed on nitrofurantoin for recurrent urinary tract infections.  During that time she noticed that her symptoms got progressively worse.  Unfortunately, her symptoms did not improve after stopping the medication.  She was treated for possible COPD.  There was consideration given for treatment with endobronchial valves and she was referred to Dr. Chase Caller.  CT of the chest showed interstitial lung disease with some pulmonary fibrosis.  She gets short of breath with ambulating.  This is markedly worsened if she tries to carry something or has to bend over.  She is not having any chest pain, pressure, or tightness.  No swelling in her legs.  She has not had any change in appetite or weight loss.  She does complain of decreased energy.  Zubrod Score: At the time of surgery this patient's most appropriate activity status/level should be described as: _0     0    Normal activity, no symptoms _1     1    Restricted in physical strenuous activity but ambulatory, able to do out light work _2     2    Ambulatory and capable of self care, unable to do work activities, up and about >50 % of waking hours                              _3     3    Only limited self care, in bed greater than 50% of waking hours _4     4    Completely disabled, no self care, confined to bed or chair _5     5    Moribund  Past Medical History:  Diagnosis Date  . Asthma   . Depression   . Emphysema of lung ([place])   . Hemorrhoids   . Hyperlipidemia   . Hypertension   .  Hypothyroidism   . Obesity   . Rosacea   . Tubular adenoma of colon 03/26/2019   Colonoscopy repeat in 3 years    Past Surgical History:  Procedure Laterality Date  . ANTERIOR AND POSTERIOR VAGINAL REPAIR  2020  . CATARACT EXTRACTION W/ INTRAOCULAR LENS IMPLANT Bilateral 2016  . DENTAL SURGERY  2016   Dental Implant  . FRACTURE SURGERY Left 2013   Left ankle  . medtronix nerve stimulator  07/18/2019  . NASAL SINUS SURGERY    . VAGINECTOMY, PARTIAL      Family History  Problem Relation Age of Onset  . Heart disease Mother   . Hypertension Mother   . Osteoporosis Mother   . Stroke Mother   . Alcohol abuse Father   . Diabetes Maternal Grandmother   . Drug abuse Son   . Breast cancer Neg Hx     Social History Social History   Tobacco Use  . Smoking status: Former Smoker    Packs/day: 0.50    Years: 15.00    Pack years: 7.50    Types: Cigarettes    Quit date: 08/18/2013    Years since quitting: 6.1  .  Smokeless tobacco: Never Used  Vaping Use  . Vaping Use: Never used  Substance Use Topics  . Alcohol use: Yes    Alcohol/week: 1.0 standard drink    Types: 1 Glasses of wine per week    Comment: socially  . Drug use: No    Current Outpatient Medications  Medication Sig Dispense Refill  . amLODipine (NORVASC) 10 MG tablet Take 1 tablet (10 mg total) by mouth daily. (Patient taking differently: Take 5 mg by mouth daily. ) 90 tablet 3  . dapagliflozin propanediol (FARXIGA) 5 MG TABS tablet Take 5 mg by mouth daily before breakfast. 30 tablet 11  . fluticasone (FLONASE) 50 MCG/ACT nasal spray Place 2 sprays into the nose as needed.     Marland Kitchen levothyroxine (SYNTHROID) 137 MCG tablet TAKE ONE TABLET BY MOUTH EVERY MORNING BEFORE BREAKFAST 90 tablet 2  . losartan (COZAAR) 100 MG tablet Take 1 tablet (100 mg total) by mouth daily. 90 tablet 3  . montelukast (SINGULAIR) 10 MG tablet TAKE ONE TABLET BY MOUTH EVERY NIGHT AT BEDTIME 90 tablet 1  . omeprazole (PRILOSEC) 20 MG  capsule TAKE ONE CAPSULE BY MOUTH DAILY 90 capsule 0  . rosuvastatin (CRESTOR) 10 MG tablet Take 1 tablet (10 mg total) by mouth daily. 90 tablet 3  . triamterene-hydrochlorothiazide (MAXZIDE-25) 37.5-25 MG tablet Take 1 tablet by mouth daily. 90 tablet 3  . venlafaxine XR (EFFEXOR-XR) 150 MG 24 hr capsule TAKE ONE CAPSULE BY MOUTH DAILY 90 capsule 2   No current facility-administered medications for this visit.    Allergies  Allergen Reactions  . Beta Adrenergic Blockers Shortness Of Breath    Avoid due to COPD  . Ace Inhibitors   . Clarithromycin     REACTION: GI sx  . Codeine     REACTION: nausea  . Telmisartan     REACTION: sleepy    Review of Systems  Constitutional: Positive for fatigue. Negative for activity change and unexpected weight change.  HENT: Positive for hearing loss. Negative for trouble swallowing and voice change.   Respiratory: Positive for cough and shortness of breath.   Cardiovascular: Negative for chest pain and leg swelling.  Gastrointestinal: Positive for abdominal pain (Reflux). Negative for abdominal distention.  Genitourinary: Negative for difficulty urinating and dysuria.  Musculoskeletal: Negative for arthralgias.  Neurological: Negative for syncope and weakness.  Hematological: Bruises/bleeds easily.  All other systems reviewed and are negative.   BP 128/73 (BP Location: Right Arm, Patient Position: Sitting, Cuff Size: Large)   Pulse 91   Temp (!) 97.3 F (36.3 C) (Temporal)   Resp (!) 24   Ht _0  (1.702 m)   Wt (!) 203 lb (92.1 kg)   SpO2 99% Comment: RA  BMI 31.79 kg/m  Physical Exam Vitals reviewed.  Constitutional:      General: She is not in acute distress.    Appearance: Normal appearance.  HENT:     Head: Normocephalic and atraumatic.  Eyes:     General: No scleral icterus.    Extraocular Movements: Extraocular movements intact.  Cardiovascular:     Rate and Rhythm: Normal rate and regular rhythm.     Heart sounds:  Normal heart sounds. No murmur heard.  No friction rub. No gallop.   Pulmonary:     Effort: Pulmonary effort is normal. No respiratory distress.     Breath sounds: No wheezing.     Comments: Faint crackles bilaterally Musculoskeletal:        General: No swelling.  Cervical back: Neck supple.  Lymphadenopathy:     Cervical: No cervical adenopathy.  Skin:    General: Skin is warm and dry.  Neurological:     General: No focal deficit present.     Mental Status: She is alert and oriented to person, place, and time.     Cranial Nerves: No cranial nerve deficit.     Motor: No weakness.     Gait: Gait normal.    Diagnostic Tests: CT CHEST WITHOUT CONTRAST  TECHNIQUE: Multidetector CT imaging of the chest was performed following the standard protocol without intravenous contrast. High resolution imaging of the lungs, as well as inspiratory and expiratory imaging, was performed.  COMPARISON:  Chest radiographs, 07/19/2019  FINDINGS: Cardiovascular: Aortic atherosclerosis. Normal heart size. Three-vessel coronary artery calcifications and/or stents. No pericardial effusion.  Mediastinum/Nodes: Prominent mediastinal lymph nodes. Small hiatal hernia. Thyroid gland, trachea, and esophagus demonstrate no significant findings.  Lungs/Pleura: Mild pulmonary fibrosis in a pattern with apical predominance, featuring irregular peripheral interstitial opacity and scattered areas of subpleural bronchiolectasis. Mild, tubular bronchiectasis throughout. No significant air trapping on expiratory phase imaging. No pleural effusion or pneumothorax.  Upper Abdomen: No acute abnormality.  Musculoskeletal: No chest wall mass or suspicious bone lesions identified.  IMPRESSION: 1. Mild pulmonary fibrosis in a pattern with apical predominance, featuring irregular peripheral interstitial opacity and scattered areas of subpleural bronchiolectasis. Mild, tubular  bronchiectasis throughout. Findings are most consistent with an "alternative diagnosis" pattern by ATS pulmonary fibrosis criteria, leading differential considerations chronic fibrotic hypersensitivity pneumonitis or NSIP. 2. Prominent mediastinal lymph nodes, nonspecific and likely reactive. 3. Coronary artery disease.  Aortic Atherosclerosis (ICD10-I70.0).   Electronically Signed   By: Eddie Candle M.D.   On: 09/04/2019 15:37 I personally reviewed the CT images and concur with the findings noted above  Function testing 08/23/2019 FVC 2.61 (80%) FEV1 2.26 (92%) DLCO 13.76 (64%)  Impression: Susan Hunt is a 74 year old woman with history of hypertension, hyperlipidemia, obesity, hypothyroidism, depression and a history of light tobacco use over a decade ago.  She has about a 1 year history of dyspnea with exertion.  Work-up was revealed interstitial lung disease.  Her CT showed findings consistent with an alternative diagnosis pattern and with some fibrosis.  She also had some mild mediastinal lymphadenopathy, most likely reactive.  She needs a surgical lung biopsy to guide therapy.  Per Dr. Golden Pop request we will plan to do bronchoscopy with bronchoalveolar lavage for cell count and cultures at the time of her lung biopsy.  I recommended to Susan Hunt that we proceed with bronchoscopy for bronchoalveolar lavage followed by robotic right VATS for lung biopsy and mediastinal lymph node sampling.  I informed her and her husband of the general nature of the procedure including the need for general anesthesia, the incision to be used, the use of a chest tube postoperatively, the expected hospital stay, and the overall recovery.  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  I informed her the indications, risk, benefits, and alternatives.  They understand this procedure is diagnostic and not therapeutic.  They understand the risks include, but are not limited to death,  MI, DVT, PE, bleeding, possible need for transfusion, infection, prolonged air leak, cardiac arrhythmias, as well as possibility of other unforeseeable complications.  She accepts the risks and wishes to proceed.  Plan: Bronchoscopy with bronchoalveolar lavage and robotic right VATS for lung biopsy and lymph node biopsy on Thursday, 10/17/2019.  I spent 45 minutes  in review of records, review of images, and in consultation with Susan Hunt today. Melrose Nakayama, MD [facility] [phone]

## 2019-10-04 ENCOUNTER — Other Ambulatory Visit: Payer: Self-pay | Admitting: *Deleted

## 2019-10-04 ENCOUNTER — Encounter: Payer: Self-pay | Admitting: *Deleted

## 2019-10-04 DIAGNOSIS — J849 Interstitial pulmonary disease, unspecified: Secondary | ICD-10-CM

## 2019-10-10 ENCOUNTER — Encounter: Payer: Self-pay | Admitting: Family Medicine

## 2019-10-15 ENCOUNTER — Other Ambulatory Visit: Payer: Self-pay

## 2019-10-15 ENCOUNTER — Encounter (HOSPITAL_COMMUNITY): Payer: Self-pay | Admitting: Vascular Surgery

## 2019-10-15 ENCOUNTER — Encounter (HOSPITAL_COMMUNITY)
Admission: RE | Admit: 2019-10-15 | Discharge: 2019-10-15 | Disposition: A | Discharge status: Home / Self Care | Payer: Medicare Other | Source: Ambulatory Visit | Attending: Thoracic Surgery (Cardiothoracic Vascular Surgery) | Admitting: Thoracic Surgery (Cardiothoracic Vascular Surgery)

## 2019-10-15 ENCOUNTER — Encounter (HOSPITAL_COMMUNITY): Payer: Self-pay

## 2019-10-15 ENCOUNTER — Encounter (HOSPITAL_COMMUNITY): Payer: Self-pay | Admitting: Anesthesiology

## 2019-10-15 ENCOUNTER — Other Ambulatory Visit (HOSPITAL_COMMUNITY)
Admission: RE | Admit: 2019-10-15 | Discharge: 2019-10-15 | Disposition: A | Discharge status: Home / Self Care | Payer: Medicare Other | Source: Ambulatory Visit

## 2019-10-15 ENCOUNTER — Ambulatory Visit (HOSPITAL_COMMUNITY)
Admission: RE | Admit: 2019-10-15 | Discharge: 2019-10-15 | Disposition: A | Discharge status: Home / Self Care | Payer: Medicare Other | Source: Ambulatory Visit | Attending: Thoracic Surgery (Cardiothoracic Vascular Surgery) | Admitting: Thoracic Surgery (Cardiothoracic Vascular Surgery)

## 2019-10-15 DIAGNOSIS — E785 Hyperlipidemia, unspecified: Secondary | ICD-10-CM | POA: Diagnosis not present

## 2019-10-15 DIAGNOSIS — J849 Interstitial pulmonary disease, unspecified: Secondary | ICD-10-CM

## 2019-10-15 DIAGNOSIS — Z79899 Other long term (current) drug therapy: Secondary | ICD-10-CM | POA: Diagnosis not present

## 2019-10-15 DIAGNOSIS — Z01812 Encounter for preprocedural laboratory examination: Secondary | ICD-10-CM | POA: Insufficient documentation

## 2019-10-15 DIAGNOSIS — R0602 Shortness of breath: Secondary | ICD-10-CM | POA: Diagnosis not present

## 2019-10-15 DIAGNOSIS — R918 Other nonspecific abnormal finding of lung field: Secondary | ICD-10-CM | POA: Diagnosis not present

## 2019-10-15 DIAGNOSIS — J986 Disorders of diaphragm: Secondary | ICD-10-CM | POA: Diagnosis not present

## 2019-10-15 DIAGNOSIS — J8489 Other specified interstitial pulmonary diseases: Secondary | ICD-10-CM | POA: Diagnosis not present

## 2019-10-15 DIAGNOSIS — I700 Atherosclerosis of aorta: Secondary | ICD-10-CM | POA: Diagnosis not present

## 2019-10-15 DIAGNOSIS — Z20822 Contact with and (suspected) exposure to covid-19: Secondary | ICD-10-CM | POA: Insufficient documentation

## 2019-10-15 HISTORY — DX: Gastro-esophageal reflux disease without esophagitis: K21.9

## 2019-10-15 HISTORY — DX: Personal history of urinary calculi: Z87.442

## 2019-10-15 LAB — GLUCOSE, CAPILLARY: Glucose-Capillary: 220 mg/dL — ABNORMAL HIGH (ref 70–99)

## 2019-10-15 LAB — BLOOD GAS, ARTERIAL
Acid-base deficit: 2 mmol/L (ref 0.0–2.0)
Bicarbonate: 22.1 mmol/L (ref 20.0–28.0)
Drawn by: 58793
FIO2: 21
O2 Saturation: 98 %
Patient temperature: 37
pCO2 arterial: 36.1 mmHg (ref 32.0–48.0)
pH, Arterial: 7.403 (ref 7.350–7.450)
pO2, Arterial: 104 mmHg (ref 83.0–108.0)

## 2019-10-15 LAB — COMPREHENSIVE METABOLIC PANEL
ALT: 31 U/L (ref 0–44)
AST: 42 U/L — ABNORMAL HIGH (ref 15–41)
Albumin: 3.9 g/dL (ref 3.5–5.0)
Alkaline Phosphatase: 89 U/L (ref 38–126)
Anion gap: 10 (ref 5–15)
BUN: 14 mg/dL (ref 8–23)
CO2: 22 mmol/L (ref 22–32)
Calcium: 9.5 mg/dL (ref 8.9–10.3)
Chloride: 103 mmol/L (ref 98–111)
Creatinine, Ser: 1.03 mg/dL — ABNORMAL HIGH (ref 0.44–1.00)
GFR calc Af Amer: >60 mL/min (ref >60)
GFR calc non Af Amer: 54 mL/min — ABNORMAL LOW (ref >60)
Glucose, Bld: 198 mg/dL — ABNORMAL HIGH (ref 70–99)
Potassium: 3.6 mmol/L (ref 3.5–5.1)
Sodium: 135 mmol/L (ref 135–145)
Total Bilirubin: 0.8 mg/dL (ref 0.3–1.2)
Total Protein: 7.3 g/dL (ref 6.5–8.1)

## 2019-10-15 LAB — HEMOGLOBIN A1C
Hgb A1c MFr Bld: 6.8 % — ABNORMAL HIGH (ref 4.8–5.6)
Mean Plasma Glucose: 148.46 mg/dL

## 2019-10-15 LAB — PROTIME-INR
INR: >10 (ref 0.8–1.2)
INR: 1.1 (ref 0.8–1.2)
Prothrombin Time: >90 seconds — ABNORMAL HIGH (ref 11.4–15.2)
Prothrombin Time: 13.4 seconds (ref 11.4–15.2)

## 2019-10-15 LAB — URINALYSIS, ROUTINE W REFLEX MICROSCOPIC
Bacteria, UA: NONE SEEN
Bilirubin Urine: NEGATIVE
Glucose, UA: >=500 mg/dL — AB
Hgb urine dipstick: NEGATIVE
Ketones, ur: NEGATIVE mg/dL
Leukocytes,Ua: NEGATIVE
Nitrite: NEGATIVE
Protein, ur: NEGATIVE mg/dL
Specific Gravity, Urine: 1.014 (ref 1.005–1.030)
pH: 6 (ref 5.0–8.0)

## 2019-10-15 LAB — CBC
HCT: 41.6 % (ref 36.0–46.0)
Hemoglobin: 13 g/dL (ref 12.0–15.0)
MCH: 27.3 pg (ref 26.0–34.0)
MCHC: 31.3 g/dL (ref 30.0–36.0)
MCV: 87.2 fL (ref 80.0–100.0)
Platelets: 318 10*3/uL (ref 150–400)
RBC: 4.77 MIL/uL (ref 3.87–5.11)
RDW: 13.1 % (ref 11.5–15.5)
WBC: 10.7 10*3/uL — ABNORMAL HIGH (ref 4.0–10.5)
nRBC: 0 % (ref 0.0–0.2)

## 2019-10-15 LAB — SURGICAL PCR SCREEN
MRSA, PCR: NEGATIVE
Staphylococcus aureus: NEGATIVE

## 2019-10-15 LAB — TYPE AND SCREEN
ABO/RH(D): O POS
Antibody Screen: NEGATIVE

## 2019-10-15 LAB — APTT
aPTT: >200 seconds (ref 24–36)
aPTT: 34 seconds (ref 24–36)

## 2019-10-15 NOTE — Progress Notes (Addendum)
Anesthesia Chart Review:    Case: [id number] Date/Time: 10/17/19 0745   Procedures:      XI ROBOTIC ASSISTED THORASCOPY (Right )     LUNG BIOPSY (Right )     VIDEO BRONCHOSCOPY WITH BRONCHOALVEOLAR LAVAGE (N/A )   Anesthesia type: General   Pre-op diagnosis: ILD   Location: [facility] OR ROOM 10 / [place] OR   Surgeons: Melrose Nakayama, MD      DISCUSSION: Patient is a 74 year old female scheduled for the above procedure. She was referred to pulmonologist Dr. Chase Caller in June 2021 for "insidious onset of shortness of breath and cough that started 1 year ago and has been progressive". CXR suggestive of ILD with mild pulmonary fibrosis on CT scan. She was referred to CT surgery for possible lung biopsy to guide therapy.  History includes former smoker (quit 08/18/13), HTN, HLD, hypothyroidism, emphysema, GERD, refractory bowel leakage (s/p Complete InterStim Sacral Neuromodulation system implantation 07/18/19, [facility]), septoplasty with bilateral inferior turbinate reductions (08/08/06). Mild pulmonary fibrosis on 08/2019 CT, but she is not aware of a COPD or asthma diagnosis.  She reported a history of negative sleep study.   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   Zubrod Score 10/03/19 per Dr. Roxan Hockey:  1 -  Restricted in physical strenuous activity but ambulatory, able to do out light work.  Dr. Chase Caller also ordered an echo as part of her DOE evaluation. This showed an LVEF of 30-35% with global hypokinesis. EKG showed SR with occasional PVC. Recent echo and EKG results were communicated with [facility] RN Thurmond Butts who had Dr. Roxan Hockey review. I also reviewed with anesthesiologist Suzette Battiest, MD who would favor cardiology preoperative evaluation but wanted me to reach out to Dr. Chase Caller for his recommendations as well.  I have updated Ryan at [employer] and will update her once I hear back from pulmonology. Chart will be left for follow-up.     ADDENDUM 10/16/19  10:44 AM: Dr. Chase Caller has reviewed echo and spoke with patient about results. Procedure to be cancelled, and she is being referred for a cardiology evaluation asap (next 1-2 weeks).   VS: BP 140/79   Pulse 79   Temp 36.5 C (Oral)   Resp 18   Ht _0  (1.702 m)   Wt 90.6 kg   SpO2 99%   BMI 31.28 kg/m    PROVIDERS: Leamon Arnt, MD is PCP  Blair Dolphin, MD is pulmonologist Rockey Situ, MD is uro-gynecologist ([place])   LABS: Preoperative labs reviewed. Initial PT/PTT were called to [facility] as critical results, but repeated and were normal.  (all labs ordered are listed, but only abnormal results are displayed)  Labs Reviewed  GLUCOSE, CAPILLARY - Abnormal; Notable for the following components:      Result Value   Glucose-Capillary 220 (*)    All other components within normal limits  HEMOGLOBIN A1C - Abnormal; Notable for the following components:   Hgb A1c MFr Bld 6.8 (*)    All other components within normal limits  APTT - Abnormal; Notable for the following components:   aPTT >200 (*)    All other components within normal limits  BLOOD GAS, ARTERIAL - Abnormal; Notable for the following components:   Allens test (pass/fail) BRACHIAL ARTERY (*)    All other components within normal limits  CBC - Abnormal; Notable for the following components:   WBC 10.7 (*)    All other components  within normal limits  COMPREHENSIVE METABOLIC PANEL - Abnormal; Notable for the following components:   Glucose, Bld 198 (*)    Creatinine, Ser 1.03 (*)    AST 42 (*)    GFR calc non Af Amer 54 (*)    All other components within normal limits  PROTIME-INR - Abnormal; Notable for the following components:   Prothrombin Time >90.0 (*)    INR >10.0 (*)    All other components within normal limits  URINALYSIS, ROUTINE W REFLEX MICROSCOPIC - Abnormal; Notable for the following components:   Glucose, UA >=500 (*)    All other components within normal limits   SURGICAL PCR SCREEN  PROTIME-INR  APTT  TYPE AND SCREEN   REPEAT PT/INR and PTT on 10/15/19 at 1507 were normal: PT 13.4, INR 1.1, PTT 34.   PFTs 08/23/19:  FVC 2.61 (80%), post 2.57 (79%) FEV1 2.26 (92%), post 2.36 (96%) DLCO cor 13.76 (64%)   IMAGES: CXR 10/15/19: In process.   CT Chest 09/04/19: IMPRESSION: 1. Mild pulmonary fibrosis in a pattern with apical predominance, featuring irregular peripheral interstitial opacity and scattered areas of subpleural bronchiolectasis. Mild, tubular bronchiectasis throughout. Findings are most consistent with an "alternative diagnosis" pattern by ATS pulmonary fibrosis criteria, leading differential considerations chronic fibrotic hypersensitivity pneumonitis or NSIP. 2. Prominent mediastinal lymph nodes, nonspecific and likely reactive. 3. Coronary artery disease.  Aortic Atherosclerosis (ICD10-I70.0).   EKG: 10/15/19: SR with occasional multi-focal PVCs. Negative T waves in septal leads.   CV: Echo 09/13/19: IMPRESSIONS  1. Left ventricular ejection fraction, by estimation, is 30 to 35%. The  left ventricle has moderately decreased function. The left ventricle  demonstrates global hypokinesis. Apex poorly visualized, consider repeat  limited echo with contrast to rule out  apical thrombus. The left ventricular internal cavity size was moderately  dilated. Left ventricular diastolic parameters are consistent with Grade I  diastolic dysfunction (impaired relaxation).  2. Right ventricular systolic function is normal. The right ventricular  size is normal. Tricuspid regurgitation signal is inadequate for assessing  PA pressure.  3. The mitral valve is normal in structure. Trivial mitral valve  regurgitation.  4. The aortic valve was not well visualized. Aortic valve regurgitation  is not visualized. No aortic stenosis is present.   Nuclear stress test 05/15/14: Overall Impression:  Normal stress nuclear study with a small, mild,  fixed apical defect consistent with thinning; no ischemia. LV Wall Motion:  NL LV Function; NL Wall Motion   Past Medical History:  Diagnosis Date  . Asthma    "I dont think I have it.  . Depression   . Emphysema of lung ([place])   . GERD (gastroesophageal reflux disease)   . Hemorrhoids   . History of kidney stones    Cystoscopy  . Hyperlipidemia   . Hypertension   . Hypothyroidism   . Obesity   . Rosacea   . Tubular adenoma of colon 03/26/2019   Colonoscopy repeat in 3 years    Past Surgical History:  Procedure Laterality Date  . ANTERIOR AND POSTERIOR VAGINAL REPAIR  2020  . CATARACT EXTRACTION W/ INTRAOCULAR LENS IMPLANT Bilateral 2016  . COLONOSCOPY    . CYSTOSCOPY    . DENTAL SURGERY  2016   Dental Implant  . FRACTURE SURGERY Left 2013   Left ankle  . medtronix nerve stimulator  07/18/2019  . NASAL SINUS SURGERY    . VAGINECTOMY, PARTIAL      MEDICATIONS: . amLODipine (NORVASC) 10 MG tablet  .  amLODipine (NORVASC) 5 MG tablet  . cephALEXin (KEFLEX) 250 MG capsule  . dapagliflozin propanediol (FARXIGA) 5 MG TABS tablet  . fluticasone (FLONASE) 50 MCG/ACT nasal spray  . levothyroxine (SYNTHROID) 137 MCG tablet  . losartan (COZAAR) 100 MG tablet  . montelukast (SINGULAIR) 10 MG tablet  . Multiple Vitamin (MULTIVITAMIN WITH MINERALS) TABS tablet  . omeprazole (PRILOSEC) 20 MG capsule  . rosuvastatin (CRESTOR) 10 MG tablet  . triamterene-hydrochlorothiazide (MAXZIDE-25) 37.5-25 MG tablet  . venlafaxine XR (EFFEXOR-XR) 150 MG 24 hr capsule   No current facility-administered medications for this encounter.     Myra Gianotti, PA-C Surgical Short Stay/Anesthesiology [facility] Phone [phone] [facility] Phone [phone] 10/15/2019 5:23 PM

## 2019-10-15 NOTE — Pre-Procedure Instructions (Signed)
ADABELLE GRIFFITHS  10/15/2019     Your procedure is scheduled on Thursday, August 5..  Report to [facility], Main Entrance or Entrance "A" at 6:00 AM                  Your surgery or procedure is scheduled to begin at 8:00 AM   Call this number if you have problems the morning of [phone]  This is the number for the Pre- Surgical Desk.                 For any other questions, please call [phone], Monday - Friday 8 AM - 4 PM.   Remember:  Do not eat or drink after midnight Wednesday, August 4.   Take these medicines the morning of surgery with A SIP OF WATER :  amLODipine (NORVASC)             omeprazole (PRILOSEC)             levothyroxine (SYNTHROID)              venlafaxine XR (EFFEXOR-XR)        What about my other blood pressure medications:   losartan (COZAAR) and triamterene-hydrochlorothiazide (MAXZIDE-25) are medications that the anesthesiologist want you to hold the morning of surgery.       WHAT DO I DO ABOUT MY DIABETES MEDICATION?               Hold dapagliflozin propanediol (FARXIGA)  Wednesday and Thursday .   How to Manage Your Diabetes Before and After Surgery  Why is it important to control my blood sugar before and after surgery? . Improving blood sugar levels before and after surgery helps healing and can limit problems. . A way of improving blood sugar control is eating a healthy diet by: o  Eating less sugar and carbohydrates o  Increasing activity/exercise o  Talking with your doctor about reaching your blood sugar goals . High blood sugars (greater than 180 mg/dL) can raise your risk of infections and slow your recovery, so you will need to focus on controlling your diabetes during the weeks before surgery. . Make sure that the doctor who takes care of your diabetes knows about your planned surgery including the date and location.  How do I manage my blood sugar before surgery? . Check your blood sugar at least 4 times a  day, starting 2 days before surgery, to make sure that the level is not too high or low. o Check your blood sugar the morning of your surgery when you wake up and every 2 hours until you get to the Short Stay unit. . If your blood sugar is less than 70 mg/dL, you will need to treat for low blood sugar: o Do not take insulin. o Treat a low blood sugar (less than 70 mg/dL) with  cup of clear juice (cranberry or apple), 4 glucose tablets, OR glucose gel. Recheck blood sugar in 15 minutes after treatment (to make sure it is greater than 70 mg/dL). If your blood sugar is not greater than 70 mg/dL on recheck, call [phone] o  for further instructions. . Report your blood sugar to the short stay nurse when you get to Short Stay.  . If you are admitted to the hospital after surgery: o Your blood sugar will be checked by the staff and you will probably be given insulin after surgery (instead of oral diabetes medicines) to make sure you  have good blood sugar levels. o The goal for blood sugar control after surgery is 80-180 mg/dL.    Special instructions:  [facility]- Preparing For Surgery  Before surgery, you can play an important role. Because skin is not sterile, your skin needs to be as free of germs as possible. You can reduce the number of germs on your skin by washing with CHG (chlorahexidine gluconate) Soap before surgery.  CHG is an antiseptic cleaner which kills germs and bonds with the skin to continue killing germs even after washing.    Oral Hygiene is also important to reduce your risk of infection.  Remember - BRUSH YOUR TEETH THE MORNING OF SURGERY WITH YOUR REGULAR TOOTHPASTE  Please do not use if you have an allergy to CHG or antibacterial soaps. If your skin becomes reddened/irritated stop using the CHG.  Do not shave (including legs and underarms) for at least 48 hours prior to first CHG shower. It is OK to shave your face.  Please follow these instructions  carefully.   1. Shower the NIGHT BEFORE SURGERY and the MORNING OF SURGERY with CHG.   2. If you chose to wash your hair, wash your hair first as usual with your normal shampoo.  3. After you shampoo,wash your face and private area with the soap you use at home, then rinse your hair and body thoroughly to remove the shampoo and soap. .  4. Use CHG as you would any other liquid soap. You can apply CHG directly to the skin and wash gently with a scrungie or a clean washcloth.   5. Apply the CHG Soap to your body ONLY FROM THE NECK DOWN.  Do not use on open wounds or open sores. Avoid contact with your eyes, ears, mouth and genitals (private parts).   6. Wash thoroughly, paying special attention to the area where your surgery will be performed.  7. Thoroughly rinse your body with warm water from the neck down.  8. DO NOT shower/wash with your normal soap after using and rinsing off the CHG Soap.  9. Pat yourself dry with a CLEAN TOWEL.  10. Wear CLEAN PAJAMAS to bed the night before surgery, wear comfortable clothes the morning of surgery  11. Place CLEAN SHEETS on your bed the night of your first shower and DO NOT SLEEP WITH PETS.  Day of Surgery: Shower as instructed above. Do not wear lotions, powders, or perfumes, or deodorant. Please wear clean clothes to the hospital/surgery center.   Remember to brush your teeth WITH YOUR REGULAR TOOTHPASTE  Do not wear jewelry, make-up or nail polish.  Do not shave 48 hours prior to surgery.    Do not bring valuables to the hospital.  [facility] is not responsible for any belongings or valuables.  Contacts, dentures or bridgework may not be worn into surgery.  Leave your suitcase in the car.  After surgery it may be brought to your room.  For patients admitted to the hospital, discharge time will be determined by your treatment team.  Patients discharged the day of surgery will not be allowed to drive home.    Please read over the fact  sheets that you were given.

## 2019-10-15 NOTE — Progress Notes (Addendum)
PCP - Dr. Billey Chang  Cardiologist - none  Chest x-ray - 10/15/19  EKG - 8/3/121  Stress Test - 2016  ECHO - 09/13/19  Cardiac Cath - no  Sleep Study - "a few years ago- negative." CPAP - no  LABS-CBC, CMP, PT, PTT, T/S, ABG, A1C,UA, PCR  ASA-no  ERAS-no  HA1C- 10/15/19 Fasting Blood Sugar - does not check Checks Blood Sugar _0_ times a day CBG on arrival to PAT was 220. Patient does not have a CBG machine.  Anesthesia- Mrs. Cotrell says she does not have COPD and does not think she has Asthma. Patient does get short of breath climbing stairs. Patient was ordered Oxygen, she has oxgen , just not a portable one, "they said their out of portable oxygen, I don't use the other oxygen, I only get short of breath when I am doing activities. I sent a message to Dr Chase Caller and CMA that has been working with patient regarding portable oxygen. ECHo 09/13/19- EF 30- 35%  Pt denies having chest pain, sob, or fever at this time. All instructions explained to the pt, with a verbal understanding of the material. Pt agrees to go over the instructions while at home for a better understanding. Pt also instructed to self quarantine after being tested for COVID-19. The opportunity to ask questions was provided.

## 2019-10-16 ENCOUNTER — Telehealth: Payer: Self-pay | Admitting: Internal Medicine

## 2019-10-16 ENCOUNTER — Other Ambulatory Visit: Payer: Self-pay | Admitting: *Deleted

## 2019-10-16 DIAGNOSIS — J849 Interstitial pulmonary disease, unspecified: Secondary | ICD-10-CM

## 2019-10-16 LAB — SARS CORONAVIRUS 2 (TAT 6-24 HRS): SARS Coronavirus 2: NEGATIVE

## 2019-10-16 NOTE — Telephone Encounter (Signed)
Called patient . She had ECHO 09/13/19 -> shows new drop in ef to 35% compared to 2016  Plan  - cancel SLB 10/17/19 - this has been communicate to [place] [facility] who originally called me with concerns about echo report that was relayed to her from anestheia. I hve informed patient as well  - refer to [place] or [place] cards - whoever can see her soon. Next 1-2 weeks ASAP. Any problems or delays call me directly on cell  Thanks    SIGNATURE    Dr. Brand Males, M.D., F.C.C.P,  Pulmonary and Critical Care Medicine Staff Physician, [place] Director - Interstitial Lung Disease  Program  Pulmonary [place] at [place], [place], [address]  Pager: [phone], If no answer  OR between  19:00-7:00h: page [phone] Telephone (clinical office): [phone] Telephone (research): [phone]  10:40 AM 10/16/2019

## 2019-10-16 NOTE — Telephone Encounter (Signed)
Referral placed.  Nothing further needed.  

## 2019-10-17 ENCOUNTER — Encounter (HOSPITAL_COMMUNITY): Admission: RE | Payer: Self-pay | Source: Home / Self Care

## 2019-10-17 ENCOUNTER — Telehealth: Payer: Self-pay | Admitting: Internal Medicine

## 2019-10-17 ENCOUNTER — Inpatient Hospital Stay (HOSPITAL_COMMUNITY)
Admission: RE | Admit: 2019-10-17 | Payer: Medicare Other | Source: Home / Self Care | Admitting: Thoracic Surgery (Cardiothoracic Vascular Surgery)

## 2019-10-17 DIAGNOSIS — R0609 Other forms of dyspnea: Secondary | ICD-10-CM

## 2019-10-17 DIAGNOSIS — R0600 Dyspnea, unspecified: Secondary | ICD-10-CM

## 2019-10-17 SURGERY — XI ROBOTIC ASSISTED THORACOSCOPY - DNU
Anesthesia: General | Laterality: Right

## 2019-10-17 NOTE — Telephone Encounter (Signed)
Susan Hunt  Our office got an appt with Dr Margaretann Loveless 11/26/19 in cards. Pls see if another provider - esp Dr Irven Shelling office can see patient sooner? She has low ef from echo a month ago. We cancelled her lung bx for today  Also. Move my appt with patient to later in [place] after cardiology visit  Thanks    SIGNATURE    Dr. Brand Males, M.D., F.C.C.P,  Pulmonary and Critical Care Medicine Staff Physician, [place] Director - Interstitial Lung Disease  Program  Pulmonary [place] [facility] [place], [place], [address]  Pager: [phone], If no answer  OR between  19:00-7:00h: page [phone] Telephone (clinical office): [phone] Telephone (research): [phone]  6:37 AM 10/17/2019

## 2019-10-22 DIAGNOSIS — J449 Chronic obstructive pulmonary disease, unspecified: Secondary | ICD-10-CM | POA: Diagnosis not present

## 2019-10-22 NOTE — Telephone Encounter (Signed)
I have placed a new cardiology referral and placed this one as urgent so we can try to see if pt could be scheduled for a sooner appt.  Attempted to call pt to see if we could move her appt to her to see MR in September but unable to reach. Left message for her to return call.   When pt returns call, please change her current appt with MR for her to see MR in September in ILD clinic for 20mn visit.

## 2019-10-22 NOTE — Telephone Encounter (Signed)
Per Vallarie Mare, pt has been able to get a sooner appt with cardiology 8/16. Saw this in pt's chart in epic. Called and spoke with pt letting her know that we will keep her appts as scheduled for her to see MR 8/31. Nothing further needed.

## 2019-10-23 ENCOUNTER — Ambulatory Visit: Payer: Medicare Other | Admitting: Family Medicine

## 2019-10-27 ENCOUNTER — Other Ambulatory Visit: Payer: Self-pay | Admitting: Family Medicine

## 2019-10-28 ENCOUNTER — Encounter: Payer: Self-pay | Admitting: Internal Medicine

## 2019-10-28 ENCOUNTER — Ambulatory Visit: Payer: Medicare Other | Admitting: Internal Medicine

## 2019-10-28 ENCOUNTER — Other Ambulatory Visit: Payer: Self-pay

## 2019-10-28 VITALS — BP 126/78 | HR 93 | Ht 67.0 in | Wt 201.0 lb

## 2019-10-28 DIAGNOSIS — I10 Essential (primary) hypertension: Secondary | ICD-10-CM | POA: Diagnosis not present

## 2019-10-28 DIAGNOSIS — I5020 Unspecified systolic (congestive) heart failure: Secondary | ICD-10-CM

## 2019-10-28 DIAGNOSIS — E119 Type 2 diabetes mellitus without complications: Secondary | ICD-10-CM | POA: Diagnosis not present

## 2019-10-28 DIAGNOSIS — K641 Second degree hemorrhoids: Secondary | ICD-10-CM

## 2019-10-28 DIAGNOSIS — E782 Mixed hyperlipidemia: Secondary | ICD-10-CM

## 2019-10-28 DIAGNOSIS — J431 Panlobular emphysema: Secondary | ICD-10-CM | POA: Diagnosis not present

## 2019-10-28 DIAGNOSIS — Z01812 Encounter for preprocedural laboratory examination: Secondary | ICD-10-CM

## 2019-10-28 NOTE — Patient Instructions (Addendum)
Medication Instructions:  CONTINUE WITH CURRENT MEDICATIONS. NO CHANGES.  *If you need a refill on your cardiac medications before your next appointment, please call your pharmacy*   Lab Work: [place] BMET CBC If you have labs (blood work) drawn today and your tests are completely normal, you will receive your results only by: Marland Kitchen MyChart Message (if you have MyChart) OR . A paper copy in the mail If you have any lab test that is abnormal or we need to change your treatment, we will call you to review the results.   Testing/Procedures: PRE PROCEDURE TEST COVID TEST- 10/29/19 AT 10:30AM- [address] [place], [place] [address]   Follow-Up: At [facility], you and your health needs are our priority.  As part of our continuing mission to provide you with exceptional heart care, we have created designated Provider Care Teams.  These Care Teams include your primary Cardiologist (physician) and Advanced Practice Providers (APPs -  Physician Assistants and Nurse Practitioners) who all work together to provide you with the care you need, when you need it.  We recommend signing up for the patient portal called "MyChart".  Sign up information is provided on this After Visit Summary.  MyChart is used to connect with patients for Virtual Visits (Telemedicine).  Patients are able to view lab/test results, encounter notes, upcoming appointments, etc.  Non-urgent messages can be sent to your provider as well.   To learn more about what you can do with MyChart, go to [email or web address].    Your next appointment:   11/11/19 AT 11:20AM  The format for your next appointment:   In Person  Provider:   Cherlynn Kaiser, MD   Other Instructions SEE [place] [address] Dept: [phone] Loc: [place]  10/28/2019  You are  scheduled for a Cardiac Catheterization on Friday, August 20 with Dr. Peter Martinique.  1. Please arrive at the [facility] (Main Entrance A) at [facility]: [address] [place], [place] [address] at 7:00 AM (This time is two hours before your procedure to ensure your preparation). Free valet parking service is available.   Special note: Every effort is made to have your procedure done on time. Please understand that emergencies sometimes delay scheduled procedures.  2. Diet: Do not eat solid foods after midnight.  The patient may have clear liquids until 5am upon the day of the procedure.  3. Labs:BMET AND CBC TODAY 10/28/19  COVID TEST:  10/29/19 AT 10:30AM - [address] [place],  [address]  4. Medication instructions in preparation for your procedure:  HOLD YOUR LOSARTAN THE DAY OF YOUR PROCEDURE  HOLD YOUR FARXIGA THE DAY OF YOUR PROCEDURE   On the morning of your procedure, take  any morning medicines NOT listed above.  You may use sips of water.  5. Plan for one night stay--bring personal belongings. 6. Bring a current list of your medications and current insurance cards. 7. You MUST have a responsible person to drive you home. 8. Someone MUST be with you the first 24 hours after you arrive home or your discharge will be delayed. 9. Please wear clothes that are easy to get on and off and wear slip-on shoes.  Thank you for allowing [place] to care for you!   -- [facility]

## 2019-10-28 NOTE — H&P (View-Only) (Signed)
Cardiology Office Note:    Date:  10/28/2019   ID:  Susan Hunt, DOB 12/27/1945, MRN [id number]  PCP:  Andy, Camille L, MD  Cardiologist:  No primary care provider on file.  Electrophysiologist:  None   Referring MD: Ramaswamy, Murali, MD   Chief Complaint: new reduced LVEF  History of Present Illness:    Susan Hunt is a 74 y.o. female with a history of HTN, HLD, asthma, obesity, hypothyroidism, and depression. She is a retired nurse, husband is a retired pharmacist, he joins her for the visit today.   She has been found to have interstitial lung disease with some pulmonary fibrosis.  She has a history of chronic nitrofurantoin use for recurrent UTIs.  Notes a 1 year history of dyspnea on exertion with work-up suggesting interstitial lung disease.  She was recommended to have surgical lung biopsy to guide therapy in the setting of pulmonary fibrosis and mild mediastinal lymphadenopathy.  She would have concomitant bronchoscopy with bronchoalveolar lavage at the time of lung biopsy.  Her surgical approach would be robotic right VATS for lung biopsy and mediastinal lymph node sampling.  An echocardiogram was performed in the evaluation of her dyspnea, documenting a drop in ejection fraction to 30 to 35%.  This is in contrast to a stress myocardial perfusion study she had in 2016 which calculated an ejection fraction of 54%.  Of note that nuclear stress test showed small mild fixed apical defect felt to be related to apical thinning and not associated with ischemia.  Because of this finding of a new drop in ejection fraction, her lung biopsy was canceled urgent cardiac consultation was recommended.  She is here today for cardiovascular preoperative evaluation. She has significant three-vessel coronary artery calcifications on recent chest CT September 04, 2019.  I have independently reviewed these images.  There is dense appearing calcifications on this non-ECG gated study in the left  main, LAD, left circumflex, and right coronary arteries.  This is concerning in the setting of reduced ejection fraction.  She describes 4 to 6 months of worsening shortness of breath with exertion, significantly limiting symptoms.  Denies significant chest pressure.  Cannot identify an inciting event for her dyspnea, new illnesses or major life stressors.  She described a cough and shortness of breath to her primary care physician.  Because of these concerns her ACE inhibitor and beta-blocker were stopped, likely to mitigate cough with regard to ACE inhibitor and with regard to reactive airway component of her presumed COPD with beta-blocker.  It is after this that her symptoms began, with coincidental temporal correlation.  Curiously, she was told many years ago that she had heart failure though it is unclear if she had a reduced ejection fraction as this was all based on chest x-ray.  She has been on ACE inhibitor and beta-blocker for quite some time.  Denies PND, orthopnea.  Has recently been started on triamterene HCTZ (HCTZ is a chronic medication), notes improvement in her leg swelling.  Currently on losartan.  Past Medical History:  Diagnosis Date  . Asthma    "I dont think I have it.  . Depression   . Emphysema of lung (HCC)   . GERD (gastroesophageal reflux disease)   . Hemorrhoids   . History of kidney stones    Cystoscopy  . Hyperlipidemia   . Hypertension   . Hypothyroidism   . Obesity   . Rosacea   . Tubular adenoma of colon 03/26/2019   Colonoscopy   repeat in 3 years    Past Surgical History:  Procedure Laterality Date  . ANTERIOR AND POSTERIOR VAGINAL REPAIR  2020  . CATARACT EXTRACTION W/ INTRAOCULAR LENS IMPLANT Bilateral 2016  . COLONOSCOPY    . CYSTOSCOPY    . DENTAL SURGERY  2016   Dental Implant  . FRACTURE SURGERY Left 2013   Left ankle  . medtronix nerve stimulator  07/18/2019   Complete InterStim Sacral Neuromudulation system implantation 07/18/19, [facility]  .  NASAL SINUS SURGERY    . VAGINECTOMY, PARTIAL      Current Medications: Current Meds  Medication Sig  . amLODipine (NORVASC) 5 MG tablet Take 5 mg by mouth daily.  . cephALEXin (KEFLEX) 250 MG capsule Take 250 mg by mouth daily.  . dapagliflozin propanediol (FARXIGA) 5 MG TABS tablet Take 5 mg by mouth daily before breakfast.  . fluticasone (FLONASE) 50 MCG/ACT nasal spray Place 2 sprays into both nostrils at bedtime.   . levothyroxine (SYNTHROID) 137 MCG tablet TAKE ONE TABLET BY MOUTH EVERY MORNING BEFORE BREAKFAST (Patient taking differently: Take 137 mcg by mouth daily before breakfast. )  . losartan (COZAAR) 100 MG tablet Take 1 tablet (100 mg total) by mouth daily.  . montelukast (SINGULAIR) 10 MG tablet TAKE ONE TABLET BY MOUTH EVERY NIGHT AT BEDTIME (Patient taking differently: Take 10 mg by mouth at bedtime. )  . Multiple Vitamin (MULTIVITAMIN WITH MINERALS) TABS tablet Take 1 tablet by mouth daily.  . omeprazole (PRILOSEC) 20 MG capsule TAKE ONE CAPSULE BY MOUTH DAILY (Patient taking differently: Take 20 mg by mouth daily. )  . rosuvastatin (CRESTOR) 10 MG tablet Take 1 tablet (10 mg total) by mouth daily.  . triamterene-hydrochlorothiazide (MAXZIDE-25) 37.5-25 MG tablet Take 1 tablet by mouth daily.  . venlafaxine XR (EFFEXOR-XR) 150 MG 24 hr capsule TAKE ONE CAPSULE BY MOUTH DAILY (Patient taking differently: Take 150 mg by mouth daily with breakfast. )   Current Facility-Administered Medications for the 10/28/19 encounter (Office Visit) with Leevi Cullars A, MD  Medication  . sodium chloride flush (NS) 0.9 % injection 3 mL     Allergies:   Beta adrenergic blockers, Ace inhibitors, Clarithromycin, Codeine, and Telmisartan   Social History   Socioeconomic History  . Marital status: Married    Spouse name: Not on file  . Number of children: Not on file  . Years of education: Not on file  . Highest education level: Not on file  Occupational History  . Not on file   Tobacco Use  . Smoking status: Former Smoker    Packs/day: 0.50    Years: 15.00    Pack years: 7.50    Types: Cigarettes    Quit date: 08/18/2013    Years since quitting: 6.2  . Smokeless tobacco: Never Used  Vaping Use  . Vaping Use: Never used  Substance and Sexual Activity  . Alcohol use: Yes    Alcohol/week: 1.0 standard drink    Types: 1 Glasses of wine per week    Comment: socially  . Drug use: No  . Sexual activity: Yes  Other Topics Concern  . Not on file  Social History Narrative  . Not on file   Social Determinants of Health   Financial Resource Strain:   . Difficulty of Paying Living Expenses: Not on file  Food Insecurity:   . Worried About Running Out of Food in the Last Year: Not on file  . Ran Out of Food in the Last Year: Not on   on file  Transportation Needs:   . Lack of Transportation (Medical): Not on file  . Lack of Transportation (Non-Medical): Not on file  Physical Activity:   . Days of Exercise per Week: Not on file  . Minutes of Exercise per Session: Not on file  Stress:   . Feeling of Stress : Not on file  Social Connections:   . Frequency of Communication with Friends and Family: Not on file  . Frequency of Social Gatherings with Friends and Family: Not on file  . Attends Religious Services: Not on file  . Active Member of Clubs or Organizations: Not on file  . Attends Archivist Meetings: Not on file  . Marital Status: Not on file     Family History: The patient's family history includes Alcohol abuse in her father; Diabetes in her maternal grandmother; Drug abuse in her son; Heart disease in her mother; Hypertension in her mother; Osteoporosis in her mother; Stroke in her mother. There is no history of Breast cancer.  ROS:   Please see the history of present illness.    All other systems reviewed and are negative.  EKGs/Labs/Other Studies Reviewed:    The following studies were reviewed today:  EKG: Sinus rhythm, nonspecific  ST-T wave changes  I have independently reviewed the images from CT chest 09/04/2019 as noted in the HPI.  Recent Labs: 03/26/2019: TSH 3.15 10/15/2019: ALT 31 10/28/2019: BUN 19; Creatinine, Ser 1.07; Hemoglobin 14.1; Platelets 353; Potassium 4.5; Sodium 140  Recent Lipid Panel    Component Value Date/Time   CHOL 154 07/19/2019 1333   TRIG 142.0 07/19/2019 1333   HDL 64.20 07/19/2019 1333   CHOLHDL 2 07/19/2019 1333   VLDL 28.4 07/19/2019 1333   LDLCALC 61 07/19/2019 1333   LDLDIRECT 146.7 05/29/2006 1115    Physical Exam:    VS:  BP 126/78   Pulse 93   Ht _0  (1.702 m)   Wt 201 lb (91.2 kg)   SpO2 98%   BMI 31.48 kg/m     Wt Readings from Last 5 Encounters:  10/28/19 201 lb (91.2 kg)  10/15/19 199 lb 11.2 oz (90.6 kg)  10/03/19 (!) 203 lb (92.1 kg)  09/10/19 200 lb 6.4 oz (90.9 kg)  08/19/19 201 lb 6.4 oz (91.4 kg)     Constitutional: No acute distress Eyes: sclera non-icteric, normal conjunctiva and lids ENMT: normal dentition, moist mucous membranes Cardiovascular: regular rhythm, normal rate, no murmurs. S1 and S2 normal. No jugular venous distention.  Respiratory: clear to auscultation bilaterally GI : normal bowel sounds, soft and nontender. No distention.   MSK: extremities warm, well perfused.  Trace pedal edema.  NEURO: grossly nonfocal exam, moves all extremities. PSYCH: alert and oriented x 3, normal mood and affect.   ASSESSMENT:    1. HFrEF (heart failure with reduced ejection fraction) ([place])   2. Essential hypertension   3. Controlled type 2 diabetes mellitus without complication, without long-term current use of insulin ([place])   4. Mixed hyperlipidemia   5. Pre-procedure lab exam    PLAN:    HFrEF (heart failure with reduced ejection fraction) ([place])  Essential hypertension - Plan: EKG 32-TFTD, Basic metabolic panel, CBC  Controlled type 2 diabetes mellitus without complication, without long-term current use of insulin ([place]) - Plan: EKG  32-KGUR, Basic metabolic panel, CBC  Mixed hyperlipidemia - Plan: EKG 42-HCWC, Basic metabolic panel, CBC  Pre-procedure lab exam - Plan: EKG 37-SEGB, Basic metabolic panel, CBC   Given  drop in ejection fraction as compared to previous nuclear stress quantitation of EF, and symptoms of cough and shortness of breath with NYHA class II-III symptoms, I believe a right and left heart catheterization is indicated.  She is preop for right VATS for biopsy of mediastinal lymph nodes as well as lung biopsy, if PCI is indicated during coronary angiography, she may need decreased duration of antiplatelet therapy, 3 to 6 months if biopsy is felt to be more urgent.  Would draw the attention of the interventionalist to this factor.  I have discussed right and left heart catheterization in great detail with the patient and her husband and we have obtained informed consent as noted below. INFORMED CONSENT: I have reviewed the risks, indications, and alternatives to cardiac catheterization, possible angioplasty, and stenting with the patient. Risks include but are not limited to bleeding, infection, vascular injury, stroke, myocardial infection, arrhythmia, kidney injury, radiation-related injury in the case of prolonged fluoroscopy use, emergency cardiac surgery, and death. The patient understands the risks of serious complication is 1-2 in 1000 with diagnostic cardiac cath and 1-2% or less with angioplasty/stenting.   Hypertension-currently well controlled on amlodipine 5 mg a day, losartan 100 mg a day, and triamterene HCTZ 37.5-25 mg.  Hyperlipidemia and diabetes-on Crestor 10 mg a day.  Also on Farxiga 5 mg a day.  Would recommend increasing her Farxiga to 10 mg a day for the benefit of her heart failure if tolerated from a diabetes standpoint.   Total time of encounter: 45 minutes total time of encounter, including 40 minutes spent in face-to-face patient care on the date of this encounter. This time  includes coordination of care and counseling regarding above mentioned problem list. Remainder of non-face-to-face time involved reviewing chart documents/testing relevant to the patient encounter and documentation in the medical record. I have independently reviewed documentation from referring provider.   Susan Racine, MD [place]  [facility]    Medication Adjustments/Labs and Tests Ordered: Current medicines are reviewed at length with the patient today.  Concerns regarding medicines are outlined above.  Orders Placed This Encounter  Procedures  . Basic metabolic panel  . CBC  . EKG 12-Lead   No orders of the defined types were placed in this encounter.   Patient Instructions  Medication Instructions:  CONTINUE WITH CURRENT MEDICATIONS. NO CHANGES.  *If you need a refill on your cardiac medications before your next appointment, please call your pharmacy*   Lab Work: PRE PROCEDURE LABS BMET CBC If you have labs (blood work) drawn today and your tests are completely normal, you will receive your results only by: . MyChart Message (if you have MyChart) OR . A paper copy in the mail If you have any lab test that is abnormal or we need to change your treatment, we will call you to review the results.   Testing/Procedures: PRE PROCEDURE TEST COVID TEST- 10/29/19 AT 10:30AM- [address] [place], [place] [address]   Follow-Up: At [facility], you and your health needs are our priority.  As part of our continuing mission to provide you with exceptional heart care, we have created designated Provider Care Teams.  These Care Teams include your primary Cardiologist (physician) and Advanced Practice Providers (APPs -  Physician Assistants and Nurse Practitioners) who all work together to provide you with the care you need, when you need it.  We recommend signing up for the patient portal called "MyChart".  Sign up information is provided on this After Visit   Summary.   MyChart is used to connect with patients for Virtual Visits (Telemedicine).  Patients are able to view lab/test results, encounter notes, upcoming appointments, etc.  Non-urgent messages can be sent to your provider as well.   To learn more about what you can do with MyChart, go to [email or web address].    Your next appointment:   11/11/19 AT 11:20AM  The format for your next appointment:   In Person  Provider:   Koki Buxton, MD   Other Instructions SEE BELOW      [facility] [address] [place] [address] Dept: [phone] Loc: [phone]  Susan Hunt  10/28/2019  You are scheduled for a Cardiac Catheterization on Friday, August 20 with Dr. Peter Jordan.  1. Please arrive at the [place] (Main Entrance A) at [facility]: [address] [place], [place] [address] at 7:00 AM (This time is two hours before your procedure to ensure your preparation). Free valet parking service is available.   Special note: Every effort is made to have your procedure done on time. Please understand that emergencies sometimes delay scheduled procedures.  2. Diet: Do not eat solid foods after midnight.  The patient may have clear liquids until 5am upon the day of the procedure.  3. Labs:BMET AND CBC TODAY 10/28/19  COVID TEST:  10/29/19 AT 10:30AM - [address] [place], [place] [address]  4. Medication instructions in preparation for your procedure:  HOLD YOUR LOSARTAN THE DAY OF YOUR PROCEDURE  HOLD YOUR FARXIGA THE DAY OF YOUR PROCEDURE   On the morning of your procedure, take  any morning medicines NOT listed above.  You may use sips of water.  5. Plan for one night stay--bring personal belongings. 6. Bring a current list of your medications and current insurance cards. 7. You MUST have a responsible person to drive you home. 8. Someone MUST be with you the first 24  hours after you arrive home or your discharge will be delayed. 9. Please wear clothes that are easy to get on and off and wear slip-on shoes.  Thank you for allowing us to care for you!   -- [facility] [facility]      

## 2019-10-28 NOTE — Progress Notes (Signed)
Cardiology Office Note:    Date:  10/28/2019   ID:  Susan Hunt, DOB [birth year], MRN [id number]  PCP:  Leamon Arnt, MD  Cardiologist:  No primary care provider on file.  Electrophysiologist:  None   Referring MD: Brand Males, MD   Chief Complaint: new reduced LVEF  History of Present Illness:    Susan Hunt is a 74 y.o. female with a history of HTN, HLD, asthma, obesity, hypothyroidism, and depression. She is a retired Marine scientist, husband is a retired Software engineer, he joins her for the visit today.   She has been found to have interstitial lung disease with some pulmonary fibrosis.  She has a history of chronic nitrofurantoin use for recurrent UTIs.  Notes a 1 year history of dyspnea on exertion with work-up suggesting interstitial lung disease.  She was recommended to have surgical lung biopsy to guide therapy in the setting of pulmonary fibrosis and mild mediastinal lymphadenopathy.  She would have concomitant bronchoscopy with bronchoalveolar lavage at the time of lung biopsy.  Her surgical approach would be robotic right VATS for lung biopsy and mediastinal lymph node sampling.  An echocardiogram was performed in the evaluation of her dyspnea, documenting a drop in ejection fraction to 30 to 35%.  This is in contrast to a stress myocardial perfusion study she had in 2016 which calculated an ejection fraction of 54%.  Of note that nuclear stress test showed small mild fixed apical defect felt to be related to apical thinning and not associated with ischemia.  Because of this finding of a new drop in ejection fraction, her lung biopsy was canceled urgent cardiac consultation was recommended.  She is here today for cardiovascular preoperative evaluation. She has significant three-vessel coronary artery calcifications on recent chest CT September 04, 2019.  I have independently reviewed these images.  There is dense appearing calcifications on this non-ECG gated study in the left  main, LAD, left circumflex, and right coronary arteries.  This is concerning in the setting of reduced ejection fraction.  She describes 4 to 6 months of worsening shortness of breath with exertion, significantly limiting symptoms.  Denies significant chest pressure.  Cannot identify an inciting event for her dyspnea, new illnesses or major life stressors.  She described a cough and shortness of breath to her primary care physician.  Because of these concerns her ACE inhibitor and beta-blocker were stopped, likely to mitigate cough with regard to ACE inhibitor and with regard to reactive airway component of her presumed COPD with beta-blocker.  It is after this that her symptoms began, with coincidental temporal correlation.  Curiously, she was told many years ago that she had heart failure though it is unclear if she had a reduced ejection fraction as this was all based on chest x-ray.  She has been on ACE inhibitor and beta-blocker for quite some time.  Denies PND, orthopnea.  Has recently been started on triamterene HCTZ (HCTZ is a chronic medication), notes improvement in her leg swelling.  Currently on losartan.  Past Medical History:  Diagnosis Date  . Asthma    "I dont think I have it.  . Depression   . Emphysema of lung ([place])   . GERD (gastroesophageal reflux disease)   . Hemorrhoids   . History of kidney stones    Cystoscopy  . Hyperlipidemia   . Hypertension   . Hypothyroidism   . Obesity   . Rosacea   . Tubular adenoma of colon 03/26/2019   Colonoscopy  repeat in 3 years    Past Surgical History:  Procedure Laterality Date  . ANTERIOR AND POSTERIOR VAGINAL REPAIR  2020  . CATARACT EXTRACTION W/ INTRAOCULAR LENS IMPLANT Bilateral 2016  . COLONOSCOPY    . CYSTOSCOPY    . DENTAL SURGERY  2016   Dental Implant  . FRACTURE SURGERY Left 2013   Left ankle  . medtronix nerve stimulator  07/18/2019   Complete InterStim Sacral Neuromudulation system implantation 07/18/19, [facility]  .  NASAL SINUS SURGERY    . VAGINECTOMY, PARTIAL      Current Medications: Current Meds  Medication Sig  . amLODipine (NORVASC) 5 MG tablet Take 5 mg by mouth daily.  . cephALEXin (KEFLEX) 250 MG capsule Take 250 mg by mouth daily.  . dapagliflozin propanediol (FARXIGA) 5 MG TABS tablet Take 5 mg by mouth daily before breakfast.  . fluticasone (FLONASE) 50 MCG/ACT nasal spray Place 2 sprays into both nostrils at bedtime.   Marland Kitchen levothyroxine (SYNTHROID) 137 MCG tablet TAKE ONE TABLET BY MOUTH EVERY MORNING BEFORE BREAKFAST (Patient taking differently: Take 137 mcg by mouth daily before breakfast. )  . losartan (COZAAR) 100 MG tablet Take 1 tablet (100 mg total) by mouth daily.  . montelukast (SINGULAIR) 10 MG tablet TAKE ONE TABLET BY MOUTH EVERY NIGHT AT BEDTIME (Patient taking differently: Take 10 mg by mouth at bedtime. )  . Multiple Vitamin (MULTIVITAMIN WITH MINERALS) TABS tablet Take 1 tablet by mouth daily.  Marland Kitchen omeprazole (PRILOSEC) 20 MG capsule TAKE ONE CAPSULE BY MOUTH DAILY (Patient taking differently: Take 20 mg by mouth daily. )  . rosuvastatin (CRESTOR) 10 MG tablet Take 1 tablet (10 mg total) by mouth daily.  Marland Kitchen triamterene-hydrochlorothiazide (MAXZIDE-25) 37.5-25 MG tablet Take 1 tablet by mouth daily.  Marland Kitchen venlafaxine XR (EFFEXOR-XR) 150 MG 24 hr capsule TAKE ONE CAPSULE BY MOUTH DAILY (Patient taking differently: Take 150 mg by mouth daily with breakfast. )   Current Facility-Administered Medications for the 10/28/19 encounter (Office Visit) with Elouise Munroe, MD  Medication  . sodium chloride flush (NS) 0.9 % injection 3 mL     Allergies:   Beta adrenergic blockers, Ace inhibitors, Clarithromycin, Codeine, and Telmisartan   Social History   Socioeconomic History  . Marital status: Married    Spouse name: Not on file  . Number of children: Not on file  . Years of education: Not on file  . Highest education level: Not on file  Occupational History  . Not on file   Tobacco Use  . Smoking status: Former Smoker    Packs/day: 0.50    Years: 15.00    Pack years: 7.50    Types: Cigarettes    Quit date: 08/18/2013    Years since quitting: 6.2  . Smokeless tobacco: Never Used  Vaping Use  . Vaping Use: Never used  Substance and Sexual Activity  . Alcohol use: Yes    Alcohol/week: 1.0 standard drink    Types: 1 Glasses of wine per week    Comment: socially  . Drug use: No  . Sexual activity: Yes  Other Topics Concern  . Not on file  Social History Narrative  . Not on file   Social Determinants of Health   Financial Resource Strain:   . Difficulty of Paying Living Expenses: Not on file  Food Insecurity:   . Worried About Charity fundraiser in the Last Year: Not on file  . Ran Out of Food in the Last Year: Not on  file  Transportation Needs:   . Film/video editor (Medical): Not on file  . Lack of Transportation (Non-Medical): Not on file  Physical Activity:   . Days of Exercise per Week: Not on file  . Minutes of Exercise per Session: Not on file  Stress:   . Feeling of Stress : Not on file  Social Connections:   . Frequency of Communication with Friends and Family: Not on file  . Frequency of Social Gatherings with Friends and Family: Not on file  . Attends Religious Services: Not on file  . Active Member of Clubs or Organizations: Not on file  . Attends Archivist Meetings: Not on file  . Marital Status: Not on file     Family History: The patient's family history includes Alcohol abuse in her father; Diabetes in her maternal grandmother; Drug abuse in her son; Heart disease in her mother; Hypertension in her mother; Osteoporosis in her mother; Stroke in her mother. There is no history of Breast cancer.  ROS:   Please see the history of present illness.    All other systems reviewed and are negative.  EKGs/Labs/Other Studies Reviewed:    The following studies were reviewed today:  EKG: Sinus rhythm, nonspecific  ST-T wave changes  I have independently reviewed the images from CT chest 09/04/2019 as noted in the HPI.  Recent Labs: 03/26/2019: TSH 3.15 10/15/2019: ALT 31 10/28/2019: BUN 19; Creatinine, Ser 1.07; Hemoglobin 14.1; Platelets 353; Potassium 4.5; Sodium 140  Recent Lipid Panel    Component Value Date/Time   CHOL 154 07/19/2019 1333   TRIG 142.0 07/19/2019 1333   HDL 64.20 07/19/2019 1333   CHOLHDL 2 07/19/2019 1333   VLDL 28.4 07/19/2019 1333   LDLCALC 61 07/19/2019 1333   LDLDIRECT 146.7 05/29/2006 1115    Physical Exam:    VS:  BP 126/78   Pulse 93   Ht _0  (1.702 m)   Wt 201 lb (91.2 kg)   SpO2 98%   BMI 31.48 kg/m     Wt Readings from Last 5 Encounters:  10/28/19 201 lb (91.2 kg)  10/15/19 199 lb 11.2 oz (90.6 kg)  10/03/19 (!) 203 lb (92.1 kg)  09/10/19 200 lb 6.4 oz (90.9 kg)  08/19/19 201 lb 6.4 oz (91.4 kg)     Constitutional: No acute distress Eyes: sclera non-icteric, normal conjunctiva and lids ENMT: normal dentition, moist mucous membranes Cardiovascular: regular rhythm, normal rate, no murmurs. S1 and S2 normal. No jugular venous distention.  Respiratory: clear to auscultation bilaterally GI : normal bowel sounds, soft and nontender. No distention.   MSK: extremities warm, well perfused.  Trace pedal edema.  NEURO: grossly nonfocal exam, moves all extremities. PSYCH: alert and oriented x 3, normal mood and affect.   ASSESSMENT:    1. HFrEF (heart failure with reduced ejection fraction) ([place])   2. Essential hypertension   3. Controlled type 2 diabetes mellitus without complication, without long-term current use of insulin ([place])   4. Mixed hyperlipidemia   5. Pre-procedure lab exam    PLAN:    HFrEF (heart failure with reduced ejection fraction) ([place])  Essential hypertension - Plan: EKG 22-GURK, Basic metabolic panel, CBC  Controlled type 2 diabetes mellitus without complication, without long-term current use of insulin ([place]) - Plan: EKG  27-CWCB, Basic metabolic panel, CBC  Mixed hyperlipidemia - Plan: EKG 76-EGBT, Basic metabolic panel, CBC  Pre-procedure lab exam - Plan: EKG 51-VOHY, Basic metabolic panel, CBC   Given her  drop in ejection fraction as compared to previous nuclear stress quantitation of EF, and symptoms of cough and shortness of breath with NYHA class II-III symptoms, I believe a right and left heart catheterization is indicated.  She is preop for right VATS for biopsy of mediastinal lymph nodes as well as lung biopsy, if PCI is indicated during coronary angiography, she may need decreased duration of antiplatelet therapy, 3 to 6 months if biopsy is felt to be more urgent.  Would draw the attention of the interventionalist to this factor.  I have discussed right and left heart catheterization in great detail with the patient and her husband and we have obtained informed consent as noted below. INFORMED CONSENT: I have reviewed the risks, indications, and alternatives to cardiac catheterization, possible angioplasty, and stenting with the patient. Risks include but are not limited to bleeding, infection, vascular injury, stroke, myocardial infection, arrhythmia, kidney injury, radiation-related injury in the case of prolonged fluoroscopy use, emergency cardiac surgery, and death. The patient understands the risks of serious complication is 1-2 in 0076 with diagnostic cardiac cath and 1-2% or less with angioplasty/stenting.   Hypertension-currently well controlled on amlodipine 5 mg a day, losartan 100 mg a day, and triamterene HCTZ 37.5-25 mg.  Hyperlipidemia and diabetes-on Crestor 10 mg a day.  Also on Farxiga 5 mg a day.  Would recommend increasing her [place] to 10 mg a day for the benefit of her heart failure if tolerated from a diabetes standpoint.   Total time of encounter: 45 minutes total time of encounter, including 40 minutes spent in face-to-face patient care on the date of this encounter. This time  includes coordination of care and counseling regarding above mentioned problem list. Remainder of non-face-to-face time involved reviewing chart documents/testing relevant to the patient encounter and documentation in the medical record. I have independently reviewed documentation from referring provider.   Cherlynn Kaiser, MD [place]  [facility]    Medication Adjustments/Labs and Tests Ordered: Current medicines are reviewed at length with the patient today.  Concerns regarding medicines are outlined above.  Orders Placed This Encounter  Procedures  . Basic metabolic panel  . CBC  . EKG 12-Lead   No orders of the defined types were placed in this encounter.   Patient Instructions  Medication Instructions:  CONTINUE WITH CURRENT MEDICATIONS. NO CHANGES.  *If you need a refill on your cardiac medications before your next appointment, please call your pharmacy*   Lab Work: [place] BMET CBC If you have labs (blood work) drawn today and your tests are completely normal, you will receive your results only by: Marland Kitchen MyChart Message (if you have MyChart) OR . A paper copy in the mail If you have any lab test that is abnormal or we need to change your treatment, we will call you to review the results.   Testing/Procedures: PRE PROCEDURE TEST COVID TEST- 10/29/19 AT 10:30AM- [address] [place], [place] [address]   Follow-Up: At [facility], you and your health needs are our priority.  As part of our continuing mission to provide you with exceptional heart care, we have created designated Provider Care Teams.  These Care Teams include your primary Cardiologist (physician) and Advanced Practice Providers (APPs -  Physician Assistants and Nurse Practitioners) who all work together to provide you with the care you need, when you need it.  We recommend signing up for the patient portal called "MyChart".  Sign up information is provided on this After Visit  Summary.   MyChart is used to connect with patients for Virtual Visits (Telemedicine).  Patients are able to view lab/test results, encounter notes, upcoming appointments, etc.  Non-urgent messages can be sent to your provider as well.   To learn more about what you can do with MyChart, go to [email or web address].    Your next appointment:   11/11/19 AT 11:20AM  The format for your next appointment:   In Person  Provider:   Cherlynn Kaiser, MD   Other Instructions SEE [place] [address] Dept: [phone] Loc: [place]  10/28/2019  You are scheduled for a Cardiac Catheterization on Friday, August 20 with Dr. Peter Martinique.  1. Please arrive at the [facility] (Main Entrance A) at [facility]: [address] [place], [place] [address] at 7:00 AM (This time is two hours before your procedure to ensure your preparation). Free valet parking service is available.   Special note: Every effort is made to have your procedure done on time. Please understand that emergencies sometimes delay scheduled procedures.  2. Diet: Do not eat solid foods after midnight.  The patient may have clear liquids until 5am upon the day of the procedure.  3. Labs:BMET AND CBC TODAY 10/28/19  COVID TEST:  10/29/19 AT 10:30AM - [address] [place], [place] [address]  4. Medication instructions in preparation for your procedure:  HOLD YOUR LOSARTAN THE DAY OF YOUR PROCEDURE  HOLD YOUR FARXIGA THE DAY OF YOUR PROCEDURE   On the morning of your procedure, take  any morning medicines NOT listed above.  You may use sips of water.  5. Plan for one night stay--bring personal belongings. 6. Bring a current list of your medications and current insurance cards. 7. You MUST have a responsible person to drive you home. 8. Someone MUST be with you the first 24  hours after you arrive home or your discharge will be delayed. 9. Please wear clothes that are easy to get on and off and wear slip-on shoes.  Thank you for allowing [place] to care for you!   -- [facility]

## 2019-10-29 ENCOUNTER — Other Ambulatory Visit: Payer: Self-pay

## 2019-10-29 ENCOUNTER — Other Ambulatory Visit (HOSPITAL_COMMUNITY)
Admission: RE | Admit: 2019-10-29 | Discharge: 2019-10-29 | Disposition: A | Discharge status: Home / Self Care | Payer: Medicare Other | Source: Ambulatory Visit | Attending: Cardiology | Admitting: Cardiology

## 2019-10-29 DIAGNOSIS — Z01812 Encounter for preprocedural laboratory examination: Secondary | ICD-10-CM | POA: Diagnosis not present

## 2019-10-29 DIAGNOSIS — Z20822 Contact with and (suspected) exposure to covid-19: Secondary | ICD-10-CM | POA: Diagnosis not present

## 2019-10-29 DIAGNOSIS — I5021 Acute systolic (congestive) heart failure: Secondary | ICD-10-CM

## 2019-10-29 LAB — BASIC METABOLIC PANEL
BUN/Creatinine Ratio: 18 (ref 12–28)
BUN: 19 mg/dL (ref 8–27)
CO2: 24 mmol/L (ref 20–29)
Calcium: 10 mg/dL (ref 8.7–10.3)
Chloride: 101 mmol/L (ref 96–106)
Creatinine, Ser: 1.07 mg/dL — ABNORMAL HIGH (ref 0.57–1.00)
GFR calc Af Amer: 59 mL/min/{1.73_m2} — ABNORMAL LOW (ref >59)
GFR calc non Af Amer: 51 mL/min/{1.73_m2} — ABNORMAL LOW (ref >59)
Glucose: 116 mg/dL — ABNORMAL HIGH (ref 65–99)
Potassium: 4.5 mmol/L (ref 3.5–5.2)
Sodium: 140 mmol/L (ref 134–144)

## 2019-10-29 LAB — CBC
Hematocrit: 42.9 % (ref 34.0–46.6)
Hemoglobin: 14.1 g/dL (ref 11.1–15.9)
MCH: 27.6 pg (ref 26.6–33.0)
MCHC: 32.9 g/dL (ref 31.5–35.7)
MCV: 84 fL (ref 79–97)
Platelets: 353 10*3/uL (ref 150–450)
RBC: 5.11 x10E6/uL (ref 3.77–5.28)
RDW: 12.9 % (ref 11.7–15.4)
WBC: 10.2 10*3/uL (ref 3.4–10.8)

## 2019-10-29 LAB — SARS CORONAVIRUS 2 (TAT 6-24 HRS): SARS Coronavirus 2: NEGATIVE

## 2019-10-29 MED ORDER — SODIUM CHLORIDE 0.9% FLUSH: 3.0000 mL | Freq: Two times a day (BID) | INTRAVENOUS | Status: DC

## 2019-10-31 ENCOUNTER — Telehealth: Payer: Self-pay | Admitting: *Deleted

## 2019-10-31 NOTE — Telephone Encounter (Addendum)
Pt contacted pre-catheterization scheduled at [facility] for: Friday November 01, 2019 9 AM Verified arrival time and place: [facility]) at: 7 AM   No solid food after midnight prior to cath, clear liquids until 5 AM day of procedure.  Hold: Farxiga-AM of procedure. Losartan-day before and day of procedure-GFR 51 -already taken today Triamterene-HCT-day before and day of procedure-GFR 51-already taken today  Except hold medications AM meds can be  taken pre-cath with sips of water including: ASA 81 mg   Confirmed patient has responsible adult to drive home post procedure and observe 24 hours after arriving home: yes  You are allowed ONE visitor in the waiting room during your procedure. Both you and your visitor must wear a mask once you enter the hospital.       COVID-19 Pre-Screening Questions:  . In the past 14 days have you had a new cough,new  shortness of breath, headache, congestion, fever (100 or greater) unexplained body aches, new sore throat, or sudden loss of taste or sense of smell? no . In the past 14  days have you been around anyone with known Covid 19? no . Have you been vaccinated for COVID-19? Yes, see immunization history  Reviewed procedure/mask/visitor instructions, COVID-19 questions with patient.

## 2019-11-01 ENCOUNTER — Ambulatory Visit (HOSPITAL_COMMUNITY)
Admission: RE | Admit: 2019-11-01 | Discharge: 2019-11-01 | Disposition: A | Discharge status: Home / Self Care | Payer: Medicare Other | Attending: Cardiology | Admitting: Cardiology

## 2019-11-01 ENCOUNTER — Encounter (HOSPITAL_COMMUNITY)
Admission: RE | Disposition: A | Discharge status: Home / Self Care | Payer: Self-pay | Source: Home / Self Care | Attending: Cardiology

## 2019-11-01 ENCOUNTER — Other Ambulatory Visit: Payer: Self-pay

## 2019-11-01 DIAGNOSIS — I5020 Unspecified systolic (congestive) heart failure: Secondary | ICD-10-CM | POA: Diagnosis not present

## 2019-11-01 DIAGNOSIS — Z79890 Hormone replacement therapy (postmenopausal): Secondary | ICD-10-CM | POA: Diagnosis not present

## 2019-11-01 DIAGNOSIS — Z881 Allergy status to other antibiotic agents status: Secondary | ICD-10-CM | POA: Diagnosis not present

## 2019-11-01 DIAGNOSIS — Z87891 Personal history of nicotine dependence: Secondary | ICD-10-CM | POA: Insufficient documentation

## 2019-11-01 DIAGNOSIS — J849 Interstitial pulmonary disease, unspecified: Secondary | ICD-10-CM | POA: Diagnosis present

## 2019-11-01 DIAGNOSIS — Z885 Allergy status to narcotic agent status: Secondary | ICD-10-CM | POA: Insufficient documentation

## 2019-11-01 DIAGNOSIS — K219 Gastro-esophageal reflux disease without esophagitis: Secondary | ICD-10-CM | POA: Insufficient documentation

## 2019-11-01 DIAGNOSIS — E782 Mixed hyperlipidemia: Secondary | ICD-10-CM | POA: Insufficient documentation

## 2019-11-01 DIAGNOSIS — E039 Hypothyroidism, unspecified: Secondary | ICD-10-CM | POA: Insufficient documentation

## 2019-11-01 DIAGNOSIS — E1159 Type 2 diabetes mellitus with other circulatory complications: Secondary | ICD-10-CM | POA: Diagnosis present

## 2019-11-01 DIAGNOSIS — I110 Hypertensive heart disease with heart failure: Secondary | ICD-10-CM | POA: Diagnosis not present

## 2019-11-01 DIAGNOSIS — J439 Emphysema, unspecified: Secondary | ICD-10-CM | POA: Insufficient documentation

## 2019-11-01 DIAGNOSIS — Z79899 Other long term (current) drug therapy: Secondary | ICD-10-CM | POA: Insufficient documentation

## 2019-11-01 DIAGNOSIS — Z8249 Family history of ischemic heart disease and other diseases of the circulatory system: Secondary | ICD-10-CM | POA: Insufficient documentation

## 2019-11-01 DIAGNOSIS — I2510 Atherosclerotic heart disease of native coronary artery without angina pectoris: Secondary | ICD-10-CM | POA: Insufficient documentation

## 2019-11-01 DIAGNOSIS — Z6831 Body mass index (BMI) 31.0-31.9, adult: Secondary | ICD-10-CM | POA: Diagnosis not present

## 2019-11-01 DIAGNOSIS — E669 Obesity, unspecified: Secondary | ICD-10-CM | POA: Diagnosis not present

## 2019-11-01 DIAGNOSIS — I5021 Acute systolic (congestive) heart failure: Secondary | ICD-10-CM

## 2019-11-01 DIAGNOSIS — Z888 Allergy status to other drugs, medicaments and biological substances status: Secondary | ICD-10-CM | POA: Insufficient documentation

## 2019-11-01 DIAGNOSIS — E119 Type 2 diabetes mellitus without complications: Secondary | ICD-10-CM

## 2019-11-01 DIAGNOSIS — I10 Essential (primary) hypertension: Secondary | ICD-10-CM | POA: Diagnosis present

## 2019-11-01 HISTORY — PX: RIGHT/LEFT HEART CATH AND CORONARY ANGIOGRAPHY: CATH118266

## 2019-11-01 HISTORY — PX: CARDIAC CATHETERIZATION: SHX172

## 2019-11-01 LAB — POCT I-STAT EG7
Acid-Base Excess: 1 mmol/L (ref 0.0–2.0)
Bicarbonate: 26.8 mmol/L (ref 20.0–28.0)
Calcium, Ion: 1.23 mmol/L (ref 1.15–1.40)
HCT: 41 % (ref 36.0–46.0)
Hemoglobin: 13.9 g/dL (ref 12.0–15.0)
O2 Saturation: 79 %
Potassium: 3.7 mmol/L (ref 3.5–5.1)
Sodium: 140 mmol/L (ref 135–145)
TCO2: 28 mmol/L (ref 22–32)
pCO2, Ven: 47.6 mmHg (ref 44.0–60.0)
pH, Ven: 7.359 (ref 7.250–7.430)
pO2, Ven: 46 mmHg — ABNORMAL HIGH (ref 32.0–45.0)

## 2019-11-01 LAB — POCT I-STAT 7, (LYTES, BLD GAS, ICA,H+H)
Acid-Base Excess: 2 mmol/L (ref 0.0–2.0)
Bicarbonate: 27.4 mmol/L (ref 20.0–28.0)
Calcium, Ion: 1.22 mmol/L (ref 1.15–1.40)
HCT: 41 % (ref 36.0–46.0)
Hemoglobin: 13.9 g/dL (ref 12.0–15.0)
O2 Saturation: 100 %
Potassium: 3.8 mmol/L (ref 3.5–5.1)
Sodium: 139 mmol/L (ref 135–145)
TCO2: 29 mmol/L (ref 22–32)
pCO2 arterial: 42.6 mmHg (ref 32.0–48.0)
pH, Arterial: 7.416 (ref 7.350–7.450)
pO2, Arterial: 169 mmHg — ABNORMAL HIGH (ref 83.0–108.0)

## 2019-11-01 SURGERY — RIGHT/LEFT HEART CATH AND CORONARY ANGIOGRAPHY
Anesthesia: LOCAL

## 2019-11-01 MED ORDER — VERAPAMIL HCL 2.5 MG/ML IV SOLN
INTRAVENOUS | Status: AC
  Filled 2019-11-01: qty 2

## 2019-11-01 MED ORDER — SODIUM CHLORIDE 0.9% FLUSH: 3.0000 mL | INTRAVENOUS | Status: DC | PRN

## 2019-11-01 MED ORDER — SODIUM CHLORIDE 0.9 % WEIGHT BASED INFUSION: 1.0000 mL/kg/h | INTRAVENOUS | Status: AC

## 2019-11-01 MED ORDER — SODIUM CHLORIDE 0.9 % IV SOLN: 250.0000 mL | INTRAVENOUS | Status: DC | PRN

## 2019-11-01 MED ORDER — SODIUM CHLORIDE 0.9% FLUSH: 3.0000 mL | Freq: Two times a day (BID) | INTRAVENOUS | Status: DC

## 2019-11-01 MED ORDER — FENTANYL CITRATE (PF) 100 MCG/2ML IJ SOLN
INTRAMUSCULAR | Status: DC | PRN
Start: 2019-11-01 — End: 2019-11-01
  Administered 2019-11-01: 25 ug via INTRAVENOUS

## 2019-11-01 MED ORDER — ONDANSETRON HCL 4 MG/2ML IJ SOLN: 4.0000 mg | Freq: Four times a day (QID) | INTRAMUSCULAR | Status: DC | PRN

## 2019-11-01 MED ORDER — MIDAZOLAM HCL 2 MG/2ML IJ SOLN
INTRAMUSCULAR | Status: AC
  Filled 2019-11-01: qty 2

## 2019-11-01 MED ORDER — LIDOCAINE HCL (PF) 1 % IJ SOLN
INTRAMUSCULAR | Status: AC
  Filled 2019-11-01: qty 30

## 2019-11-01 MED ORDER — SODIUM CHLORIDE 0.9 % IV SOLN: INTRAVENOUS | Status: DC

## 2019-11-01 MED ORDER — IOHEXOL 350 MG/ML SOLN
INTRAVENOUS | Status: DC | PRN
  Administered 2019-11-01: 90 mL

## 2019-11-01 MED ORDER — HEPARIN (PORCINE) IN NACL 1000-0.9 UT/500ML-% IV SOLN
INTRAVENOUS | Status: DC | PRN
  Administered 2019-11-01 (×3): 500 mL

## 2019-11-01 MED ORDER — VERAPAMIL HCL 2.5 MG/ML IV SOLN
INTRAVENOUS | Status: DC | PRN
  Administered 2019-11-01: 10 mL via INTRA_ARTERIAL

## 2019-11-01 MED ORDER — ACETAMINOPHEN 325 MG PO TABS: 650.0000 mg | ORAL_TABLET | ORAL | Status: DC | PRN

## 2019-11-01 MED ORDER — MIDAZOLAM HCL 2 MG/2ML IJ SOLN
INTRAMUSCULAR | Status: DC | PRN
  Administered 2019-11-01: 1 mg via INTRAVENOUS

## 2019-11-01 MED ORDER — LIDOCAINE HCL (PF) 1 % IJ SOLN
INTRAMUSCULAR | Status: DC | PRN
  Administered 2019-11-01 (×2): 2 mL

## 2019-11-01 MED ORDER — HEPARIN (PORCINE) IN NACL 1000-0.9 UT/500ML-% IV SOLN
INTRAVENOUS | Status: AC
  Filled 2019-11-01: qty 1000

## 2019-11-01 MED ORDER — NITROGLYCERIN 1 MG/10 ML FOR IR/CATH LAB
INTRA_ARTERIAL | Status: AC
  Filled 2019-11-01: qty 10

## 2019-11-01 MED ORDER — ASPIRIN 81 MG PO CHEW: 81.0000 mg | CHEWABLE_TABLET | ORAL | Status: DC

## 2019-11-01 MED ORDER — HEPARIN SODIUM (PORCINE) 1000 UNIT/ML IJ SOLN
INTRAMUSCULAR | Status: AC
  Filled 2019-11-01: qty 1

## 2019-11-01 MED ORDER — FENTANYL CITRATE (PF) 100 MCG/2ML IJ SOLN
INTRAMUSCULAR | Status: AC
  Filled 2019-11-01: qty 2

## 2019-11-01 MED ORDER — HEPARIN SODIUM (PORCINE) 1000 UNIT/ML IJ SOLN
INTRAMUSCULAR | Status: DC | PRN
  Administered 2019-11-01: 4500 [IU] via INTRAVENOUS

## 2019-11-01 SURGICAL SUPPLY — 13 items
CATH 5FR JL3.5 JR4 ANG PIG MP (CATHETERS) ×1 IMPLANT
CATH BALLN WEDGE 5F 110CM (CATHETERS) ×1 IMPLANT
DEVICE RAD COMP TR BAND LRG (VASCULAR PRODUCTS) ×1 IMPLANT
GLIDESHEATH SLEND SS 6F .021 (SHEATH) ×1 IMPLANT
GUIDEWIRE INQWIRE 1.5J.035X260 (WIRE) IMPLANT
INQWIRE 1.5J .035X260CM (WIRE) ×2
KIT HEART LEFT (KITS) ×2 IMPLANT
PACK CARDIAC CATHETERIZATION (CUSTOM PROCEDURE TRAY) ×2 IMPLANT
SHEATH GLIDE SLENDER 4/5FR (SHEATH) ×1 IMPLANT
SHEATH PROBE COVER 6X72 (BAG) ×1 IMPLANT
SYR MEDRAD MARK 7 150ML (SYRINGE) ×1 IMPLANT
TRANSDUCER W/STOPCOCK (MISCELLANEOUS) ×2 IMPLANT
TUBING CIL FLEX 10 FLL-RA (TUBING) ×2 IMPLANT

## 2019-11-01 NOTE — Discharge Instructions (Signed)
Radial Site Care  This sheet gives you information about how to care for yourself after your procedure. Your health care provider may also give you more specific instructions. If you have problems or questions, contact your health care provider. What can I expect after the procedure? After the procedure, it is common to have:  Bruising and tenderness at the catheter insertion area. Follow these instructions at home: Medicines  Take over-the-counter and prescription medicines only as told by your health care provider. Insertion site care  Follow instructions from your health care provider about how to take care of your insertion site. Make sure you: ? Wash your hands with soap and water before you change your bandage (dressing). If soap and water are not available, use hand sanitizer. ? Change your dressing as told by your health care provider. ? Leave stitches (sutures), skin glue, or adhesive strips in place. These skin closures may need to stay in place for 2 weeks or longer. If adhesive strip edges start to loosen and curl up, you may trim the loose edges. Do not remove adhesive strips completely unless your health care provider tells you to do that.  Check your insertion site every day for signs of infection. Check for: ? Redness, swelling, or pain. ? Fluid or blood. ? Pus or a bad smell. ? Warmth.  Do not take baths, swim, or use a hot tub until your health care provider approves.  You may shower 24-48 hours after the procedure, or as directed by your health care provider. ? Remove the dressing and gently wash the site with plain soap and water. ? Pat the area dry with a clean towel. ? Do not rub the site. That could cause bleeding.  Do not apply powder or lotion to the site. Activity   For 24 hours after the procedure, or as directed by your health care provider: ? Do not flex or bend the affected arm. ? Do not push or pull heavy objects with the affected arm. ? Do not  drive yourself home from the hospital or clinic. You may drive 24 hours after the procedure unless your health care provider tells you not to. ? Do not operate machinery or power tools.  Do not lift anything that is heavier than 10 lb (4.5 kg), or the limit that you are told, until your health care provider says that it is safe.  Ask your health care provider when it is okay to: ? Return to work or school. ? Resume usual physical activities or sports. ? Resume sexual activity. General instructions  If the catheter site starts to bleed, raise your arm and put firm pressure on the site. If the bleeding does not stop, get help right away. This is a medical emergency.  If you went home on the same day as your procedure, a responsible adult should be with you for the first 24 hours after you arrive home.  Keep all follow-up visits as told by your health care provider. This is important. Contact a health care provider if:  You have a fever.  You have redness, swelling, or yellow drainage around your insertion site. Get help right away if:  You have unusual pain at the radial site.  The catheter insertion area swells very fast.  The insertion area is bleeding, and the bleeding does not stop when you hold steady pressure on the area.  Your arm or hand becomes pale, cool, tingly, or numb. These symptoms may represent a serious problem   that is an emergency. Do not wait to see if the symptoms will go away. Get medical help right away. Call your local emergency services (911 in the [place] Do not drive yourself to the hospital. Summary  After the procedure, it is common to have bruising and tenderness at the site.  Follow instructions from your health care provider about how to take care of your radial site wound. Check the wound every day for signs of infection.  Do not lift anything that is heavier than 10 lb (4.5 kg), or the limit that you are told, until your health care provider says  that it is safe. This information is not intended to replace advice given to you by your health care provider. Make sure you discuss any questions you have with your health care provider. Document Revised: 04/05/2017 Document Reviewed: 04/05/2017 Elsevier Patient Education  2020 [employer].

## 2019-11-01 NOTE — Interval H&P Note (Signed)
History and Physical Interval Note:  11/01/2019 9:36 AM  Susan Hunt  has presented today for surgery, with the diagnosis of heart failure.  The various methods of treatment have been discussed with the patient and family. After consideration of risks, benefits and other options for treatment, the patient has consented to  Procedure(s): RIGHT/LEFT HEART CATH AND CORONARY ANGIOGRAPHY (N/A) as a surgical intervention.  The patient's history has been reviewed, patient examined, no change in status, stable for surgery.  I have reviewed the patient's chart and labs.  Questions were answered to the patient's satisfaction.    Cath Lab Visit (complete for each Cath Lab visit)  Clinical Evaluation Leading to the Procedure:   ACS: No.  Non-ACS:    Anginal Classification: CCS II  Anti-ischemic medical therapy: Minimal Therapy (1 class of medications)  Non-Invasive Test Results: Intermediate-risk stress test findings: cardiac mortality 1-3%/year  Prior CABG: No previous CABG       Collier Salina [facility] 11/01/2019 9:36 AM

## 2019-11-01 NOTE — Research (Signed)
[place] Informed Consent   Subject Name: Susan Hunt  Subject met inclusion and exclusion criteria.  The informed consent form, study requirements and expectations were reviewed with the subject and questions and concerns were addressed prior to the signing of the consent form.  The subject verbalized understanding of the trail requirements.  The subject agreed to participate in the [facility] trial and signed the informed consent.  The informed consent was obtained prior to performance of any protocol-specific procedures for the subject.  A copy of the signed informed consent was given to the subject and a copy was placed in the subject's medical record.  Philemon Kingdom D 11/01/2019, 9:03 AM

## 2019-11-03 ENCOUNTER — Telehealth: Payer: Self-pay | Admitting: Internal Medicine

## 2019-11-03 NOTE — Telephone Encounter (Signed)
Susan Hunt  Got a message from another clinic RN that patient said she still does NOt have portable o2. DO not know what the issue is but we can certainly do requal walk simple walk when she sees me 11/12/19   Thanks    SIGNATURE    Dr. Brand Males, M.D., F.C.C.P,  Pulmonary and Critical Care Medicine Staff Physician, [place] Director - Interstitial Lung Disease  Program  Pulmonary [place] at [place], [place], [address]  Pager: [phone], If no answer  OR between  19:00-7:00h: page [phone] Telephone (clinical office): [phone] Telephone (research): [phone]  1:23 PM 11/03/2019

## 2019-11-04 ENCOUNTER — Encounter (HOSPITAL_COMMUNITY): Payer: Self-pay | Admitting: Cardiology

## 2019-11-06 NOTE — Telephone Encounter (Signed)
Attempted to call pt but unable to reach. Left message for her to return call.  Order was placed at pt's last OV for her to receive POC. Will await a return call from pt to gather information from her.

## 2019-11-11 ENCOUNTER — Encounter: Payer: Self-pay | Admitting: Internal Medicine

## 2019-11-11 ENCOUNTER — Other Ambulatory Visit: Payer: Self-pay

## 2019-11-11 ENCOUNTER — Ambulatory Visit: Payer: Medicare Other | Admitting: Internal Medicine

## 2019-11-11 VITALS — BP 126/74 | HR 91 | Temp 96.6°F | Ht 67.0 in | Wt 199.0 lb

## 2019-11-11 DIAGNOSIS — I10 Essential (primary) hypertension: Secondary | ICD-10-CM | POA: Diagnosis not present

## 2019-11-11 DIAGNOSIS — E119 Type 2 diabetes mellitus without complications: Secondary | ICD-10-CM

## 2019-11-11 DIAGNOSIS — I5020 Unspecified systolic (congestive) heart failure: Secondary | ICD-10-CM | POA: Diagnosis not present

## 2019-11-11 DIAGNOSIS — I428 Other cardiomyopathies: Secondary | ICD-10-CM | POA: Diagnosis not present

## 2019-11-11 DIAGNOSIS — Z79899 Other long term (current) drug therapy: Secondary | ICD-10-CM | POA: Diagnosis not present

## 2019-11-11 DIAGNOSIS — E782 Mixed hyperlipidemia: Secondary | ICD-10-CM

## 2019-11-11 MED ORDER — METOPROLOL SUCCINATE ER 25 MG PO TB24: 25.0000 mg | ORAL_TABLET | Freq: Every day | ORAL | 3 refills | Status: DC

## 2019-11-11 MED ORDER — DAPAGLIFLOZIN PROPANEDIOL 5 MG PO TABS
10.0000 mg | ORAL_TABLET | Freq: Every day | ORAL | 11 refills | Status: DC
Start: 2019-11-11 — End: 2019-11-12

## 2019-11-11 NOTE — Progress Notes (Signed)
Cardiology Office Note:    Date:  11/11/2019   ID:  [name], [name] 10/26/1945, MRN [id number]  PCP:  Leamon Arnt, MD  Cardiologist:  No primary care provider on file.  Electrophysiologist:  None   Referring MD: Leamon Arnt, MD   Chief Complaint: Nonischemic cardiomyopathy  History of Present Illness:    Susan Hunt is a 74 y.o. female with a history of nonischemic cardiomyopathy with normal right and left heart pressures, hypertension, hyperlipidemia, interstitial lung disease with pulmonary fibrosis.  Presents for follow-up after cath for medication titration.  We discussed heart failure therapy in depth today.  She was previously on ACE inhibitor and beta-blocker but these were stopped with concern for her shortness of breath and coughing.  She has been resumed on losartan 100 mg daily.  We discussed starting beta-blocker today.  She would like to check the cost of Entresto prior to transitioning, we can arrange this at follow-up.  She is already on Farxiga 5 mg daily for diabetes, A1c 6.8.  Discussed increasing this to cardiovascular dose of 10 mg daily.  She inquires whether a lung biopsy is still necessary.  We reviewed that normal right-sided heart pressures as well as normal LVEDP suggest that there may be an extracardiac cause of shortness of breath despite reduced ejection fraction.  Past Medical History:  Diagnosis Date  . Asthma    "I dont think I have it.  . Depression   . Emphysema of lung ([place])   . GERD (gastroesophageal reflux disease)   . Hemorrhoids   . History of kidney stones    Cystoscopy  . Hyperlipidemia   . Hypertension   . Hypothyroidism   . Obesity   . Rosacea   . Tubular adenoma of colon 03/26/2019   Colonoscopy repeat in 3 years    Past Surgical History:  Procedure Laterality Date  . ANTERIOR AND POSTERIOR VAGINAL REPAIR  2020  . CATARACT EXTRACTION W/ INTRAOCULAR LENS IMPLANT Bilateral 2016  . COLONOSCOPY    . CYSTOSCOPY     . DENTAL SURGERY  2016   Dental Implant  . FRACTURE SURGERY Left 2013   Left ankle  . medtronix nerve stimulator  07/18/2019   Complete InterStim Sacral Neuromudulation system implantation 07/18/19, [facility]  . NASAL SINUS SURGERY    . RIGHT/LEFT HEART CATH AND CORONARY ANGIOGRAPHY N/A 11/01/2019   Procedure: RIGHT/LEFT HEART CATH AND CORONARY ANGIOGRAPHY;  Surgeon: Martinique, Peter M, MD;  [facility];  Service: Cardiovascular;  Laterality: N/A;  . VAGINECTOMY, PARTIAL      Current Medications: Current Meds  Medication Sig  . amLODipine (NORVASC) 5 MG tablet Take 5 mg by mouth daily.  . cephALEXin (KEFLEX) 250 MG capsule Take 250 mg by mouth daily.  . dapagliflozin propanediol (FARXIGA) 5 MG TABS tablet Take 2 tablets (10 mg total) by mouth daily before breakfast.  . fluticasone (FLONASE) 50 MCG/ACT nasal spray Place 2 sprays into both nostrils at bedtime.   Marland Kitchen levothyroxine (SYNTHROID) 137 MCG tablet TAKE ONE TABLET BY MOUTH EVERY MORNING BEFORE BREAKFAST (Patient taking differently: Take 137 mcg by mouth daily before breakfast. )  . losartan (COZAAR) 100 MG tablet Take 1 tablet (100 mg total) by mouth daily.  . montelukast (SINGULAIR) 10 MG tablet TAKE ONE TABLET BY MOUTH EVERY NIGHT AT BEDTIME (Patient taking differently: Take 10 mg by mouth at bedtime. )  . Multiple Vitamin (MULTIVITAMIN WITH MINERALS) TABS tablet Take 1 tablet by mouth daily.  Marland Kitchen  omeprazole (PRILOSEC) 20 MG capsule TAKE ONE CAPSULE BY MOUTH DAILY (Patient taking differently: Take 20 mg by mouth daily. )  . rosuvastatin (CRESTOR) 10 MG tablet Take 1 tablet (10 mg total) by mouth daily.  Marland Kitchen triamterene-hydrochlorothiazide (MAXZIDE-25) 37.5-25 MG tablet Take 1 tablet by mouth daily.  Marland Kitchen venlafaxine XR (EFFEXOR-XR) 150 MG 24 hr capsule TAKE ONE CAPSULE BY MOUTH DAILY (Patient taking differently: Take 150 mg by mouth daily with breakfast. )  . [DISCONTINUED] dapagliflozin propanediol (FARXIGA) 5 MG TABS tablet Take 5  mg by mouth daily before breakfast.   Current Facility-Administered Medications for the 11/11/19 encounter (Office Visit) with Elouise Munroe, MD  Medication  . sodium chloride flush (NS) 0.9 % injection 3 mL     Allergies:   Beta adrenergic blockers, Ace inhibitors, Clarithromycin, Codeine, and Telmisartan   Social History   Tobacco Use  . Smoking status: Former Smoker    Packs/day: 0.50    Years: 15.00    Pack years: 7.50    Types: Cigarettes    Quit date: 08/18/2013    Years since quitting: 6.2  . Smokeless tobacco: Never Used  Vaping Use  . Vaping Use: Never used  Substance Use Topics  . Alcohol use: Yes    Alcohol/week: 1.0 standard drink    Types: 1 Glasses of wine per week    Comment: socially  . Drug use: No     Family History: The patient's family history includes Alcohol abuse in her father; Diabetes in her maternal grandmother; Drug abuse in her son; Heart disease in her mother; Hypertension in her mother; Osteoporosis in her mother; Stroke in her mother. There is no history of Breast cancer.  ROS:   Please see the history of present illness.    All other systems reviewed and are negative.  EKGs/Labs/Other Studies Reviewed:    The following studies were reviewed today:  I have independently reviewed the images from coronary angiography 11/01/2019  Recent Labs: 03/26/2019: TSH 3.15 10/15/2019: ALT 31 10/28/2019: BUN 19; Creatinine, Ser 1.07; Platelets 353 11/01/2019: Hemoglobin 13.9; Potassium 3.7; Sodium 140  Recent Lipid Panel    Component Value Date/Time   CHOL 154 07/19/2019 1333   TRIG 142.0 07/19/2019 1333   HDL 64.20 07/19/2019 1333   CHOLHDL 2 07/19/2019 1333   VLDL 28.4 07/19/2019 1333   LDLCALC 61 07/19/2019 1333   LDLDIRECT 146.7 05/29/2006 1115    Physical Exam:    VS:  BP 126/74   Pulse 91   Temp (!) 96.6 F (35.9 C)   Ht _0  (1.702 m)   Wt 199 lb (90.3 kg)   SpO2 96%   BMI 31.17 kg/m     Wt Readings from Last 5 Encounters:   11/11/19 199 lb (90.3 kg)  11/01/19 200 lb (90.7 kg)  10/28/19 201 lb (91.2 kg)  10/15/19 199 lb 11.2 oz (90.6 kg)  10/03/19 (!) 203 lb (92.1 kg)     Constitutional: No acute distress Eyes: sclera non-icteric, normal conjunctiva and lids ENMT: normal dentition, moist mucous membranes Cardiovascular: regular rhythm, normal rate, no murmurs. S1 and S2 normal. Radial pulses normal bilaterally. No jugular venous distention.  Respiratory: clear to auscultation bilaterally GI : normal bowel sounds, soft and nontender. No distention.   MSK: extremities warm, well perfused. No edema.  NEURO: grossly nonfocal exam, moves all extremities. PSYCH: alert and oriented x 3, normal mood and affect.   ASSESSMENT:    1. HFrEF (heart failure with reduced ejection fraction) (  [place])   2. Nonischemic cardiomyopathy ([place])   3. Medication management   4. Essential hypertension   5. Controlled type 2 diabetes mellitus without complication, without long-term current use of insulin ([place])   6. Mixed hyperlipidemia    PLAN:    Heart failure with reduced ejection fraction-continue losartan, initiate metoprolol succinate 25 mg daily.  Uptitrate Farxiga to 10 mg daily.  Follow-up with pharmacist in 2 weeks for discussion of transition to [facility] with an eye towards cost as this may be a prohibitive factor.  We will plan to repeat an echocardiogram in 3 to 6 months for improvement in EF.  Hypertension-blood pressure is well controlled, continue losartan, triamterene HCTZ, metoprolol.  Will likely need to stop triamterene HCTZ if we are initiating Entresto.  Would favor spironolactone as well for heart failure therapy.  Hyperlipidemia-continue Crestor 10 mg daily.  LDL 61, triglycerides 142, HDL 64 in May 2021.  Diabetes mellitus type 2-hemoglobin A1c is 6.8, she is on Farxiga 5 mg, for the benefit of her heart failure we will uptitrate this to 10 mg daily.  Total time of encounter: 30 minutes total time of  encounter, including 25 minutes spent in face-to-face patient care on the date of this encounter. This time includes coordination of care and counseling regarding above mentioned problem list. Remainder of non-face-to-face time involved reviewing chart documents/testing relevant to the patient encounter and documentation in the medical record. I have independently reviewed documentation from referring provider.   Cherlynn Kaiser, MD [place]  [facility]    Medication Adjustments/Labs and Tests Ordered: Current medicines are reviewed at length with the patient today.  Concerns regarding medicines are outlined above.  Orders Placed This Encounter  Procedures  . AMB Referral to [facility] Pharm-D   Meds ordered this encounter  Medications  . dapagliflozin propanediol (FARXIGA) 5 MG TABS tablet    Sig: Take 2 tablets (10 mg total) by mouth daily before breakfast.    Dispense:  30 tablet    Refill:  11  . metoprolol succinate (TOPROL XL) 25 MG 24 hr tablet    Sig: Take 1 tablet (25 mg total) by mouth daily.    Dispense:  30 tablet    Refill:  3    Patient Instructions  Medication Instructions:  Increase Farxiga to 33m daily (2 tablets) START Toprol XL- 218mDaily (1Tablet) *If you need a refill on your cardiac medications before your next appointment, please call your pharmacy*   Lab Work: None Ordered At This Time.   If you have labs (blood work) drawn today and your tests are completely normal, you will receive your results only by: . Marland KitchenyChart Message (if you have MyChart) OR . A paper copy in the mail If you have any lab test that is abnormal or we need to change your treatment, we will call you to review the results.   Testing/Procedures: None Ordered At This Time.     Follow-Up: [facility]  As part of our continuing mission to provide you with exceptional heart care, we have created designated Provider Care  Teams.  These Care Teams include your primary Cardiologist (physician) and Advanced Practice Providers (APPs -  Physician Assistants and Nurse Practitioners) who all work together to provide you with the care you need, when you need it.    Your next appointment:   5 week(s)  The format for your next appointment:   In Person  Provider:  Cherlynn Kaiser, MD   Other Instructions Referral to [facility] for Medication Management - Initiation of Entresto. In the next 2 weeks please.

## 2019-11-11 NOTE — Patient Instructions (Signed)
Medication Instructions:  Increase Farxiga to 32m daily (2 tablets) START Toprol XL- 262mDaily (1Tablet) *If you need a refill on your cardiac medications before your next appointment, please call your pharmacy*   Lab Work: None Ordered At This Time.   If you have labs (blood work) drawn today and your tests are completely normal, you will receive your results only by:  My[place]if you have MyChart) OR  A paper copy in the mail If you have any lab test that is abnormal or we need to change your treatment, we will call you to review the results.   Testing/Procedures: None Ordered At This Time.     Follow-Up: [facility].  As part of our continuing mission to provide you with exceptional heart care, we have created designated Provider Care Teams.  These Care Teams include your primary Cardiologist (physician) and Advanced Practice Providers (APPs -  Physician Assistants and Nurse Practitioners) who all work together to provide you with the care you need, when you need it.    Your next appointment:   5 week(s)  The format for your next appointment:   In Person  Provider:   GaCherlynn KaiserMD   Other Instructions Referral to [facility] for Medication Management - Initiation of Entresto. In the next 2 weeks please.

## 2019-11-12 ENCOUNTER — Encounter: Payer: Self-pay | Admitting: Internal Medicine

## 2019-11-12 ENCOUNTER — Ambulatory Visit: Payer: Medicare Other | Admitting: Internal Medicine

## 2019-11-12 VITALS — BP 122/72 | HR 72 | Temp 98.5°F | Ht 67.0 in | Wt 200.1 lb

## 2019-11-12 DIAGNOSIS — J849 Interstitial pulmonary disease, unspecified: Secondary | ICD-10-CM

## 2019-11-12 DIAGNOSIS — R0600 Dyspnea, unspecified: Secondary | ICD-10-CM | POA: Diagnosis not present

## 2019-11-12 DIAGNOSIS — Z01811 Encounter for preprocedural respiratory examination: Secondary | ICD-10-CM

## 2019-11-12 DIAGNOSIS — R0609 Other forms of dyspnea: Secondary | ICD-10-CM

## 2019-11-12 MED ORDER — DAPAGLIFLOZIN PROPANEDIOL 5 MG PO TABS
10.0000 mg | ORAL_TABLET | Freq: Every day | ORAL | 11 refills | Status: DC
Start: 2019-11-12 — End: 2019-12-16

## 2019-11-12 NOTE — Patient Instructions (Addendum)
ICD-10-CM   1. ILD (interstitial lung disease) ([place])  J84.9   2. Dyspnea on exertion  R06.00   3. Preop respiratory exam  Z01.811     Noted new onset of cardiac issues. Glad you are under good care by Dr Margaretann Loveless  Plan Repeat walk test to qualify with [employer] for portable O2 - did not show desaturations  - cancel portable o2 and other o2 at home Repeat HRCT supine and prone and inspiratory and expiratory phases   - if ILD still present/worse - > back to Dr Roxan Hockey for lung biopsy       - we discussed rationale for biopsy      - I will send him a message   Followup  -  We will call her with the results of the CT chest before deciding on the next step

## 2019-11-12 NOTE — Progress Notes (Signed)
OV 08/19/2019  Subjective:  Patient ID: [name], female , DOB: [birth year] , age 74 y.o. , MRN: [id number] , ADDRESS: [place] [address]   08/19/2019 -   Chief Complaint  Patient presents with   Consult    COPD, emphysema.  sob with exertion.  dry coughs alot at night     HPI FREDDA CLARIDA 74 y.o. -she is a retired Marine scientist.  She used to work at the [employer] across [place] long and having retired from that 10 years ago and then she worked for another agency apparently having retired from all duties few years ago.  Husband is a retired Software engineer.  They are here for shortness of breath.  She tells me that she has had insidious onset of shortness of breath and cough that started 1 year ago and has been progressive.  In the same time she is also been on nitrofurantoin for times.  First 3 of these were 1 week each.  The last 1 being for at least 6 weeks ending approximately 4-6 weeks ago.  Because of progressive symptoms she was given Stiolto but this did not help.  She has been on longstanding Singulair for several years independent of these problems.  This because of allergies.  Therefore she has been referred here.  She did have a chest x-ray that on my personal visualization shows ILD changes.  It is documented below.  She did have a CT abdomen lung images that was reported as normal but I am not so sure from a year ago.  She is a previous smoker.  She is also self-referred herself to [facility] on the basis that she think she might have advanced COPD and is looking for a Zephyr valve.  Symptoms started in August 2020.   [facility] Integrated Comprehensive ILD Questionnaire  Symptoms:   SYMPTOM SCALE - ILD 08/19/2019   O2 use ra  Shortness of Breath 0 -> 5 scale with 5 being worst (score 6 If unable to do)  At rest 1  Simple tasks - showers, clothes change, eating, shaving 2  Household (dishes, doing bed, laundry) 5  Shopping 5  Walking  level at own pace 2  Walking up Stairs 5  Total (30-36) Dyspnea Score 20  How bad is your cough? x  How bad is your fatigue x  How bad is nausea x  How bad is vomiting?  x  How bad is diarrhea? x  How bad is anxiety? x  How bad is depression x    She does have a cough.  She says the cough started in August 2020 the same for shortness of breath started.  She does cough at night.  She does cough when she lies down and it gets worse.  She does feel a tickle in her throat.  Cough does not affect her voice she does not bring any phlegm.  There is no wheezing.  There is no nausea no vomiting no diarrhea.    Past Medical History :   She does have a histo abdomen ry of asthma for the last few years.  Also COPD not otherwise specified for the last few years.  His acid reflux disease for the last few years.  Diabetes for the last few weeks and thyroid disease for the last few years.  Otherwise denies any collagen vascular disease or vasculitis.  Denies any sleep apnea.  Denies pulmonary hypertension.  Denies stroke denies pneumonias  recurrent pneumonias.  Denies heart disease or pleurisy.   ROS: Positive for fatigue for the last several months.  Arthralgia for the last several months.  Dry eyes for the last several months.  With intermittent nausea for the last few years.  T there is daytime sleepiness for the last several months.     FAMILY HISTORY of LUNG DISEASE:  -Denies any pulmonary fibrosis.  Denies COPD denies asthma in the family.  Denies sarcoid denies cystic fibrosis denies hypersensitive pneumonitis.  Denies any autoimmune disease.   EXPOSURE HISTORY:   -She denies any Covid history.  Denies any exposure to Covid.  She did do Covid vaccine.  No reactions after the Covid vaccine.  Did smoke cigarettes 15-year 2000 and 2015 10 to 12 cigarettes a day.  Did not do any passive smoking.  Never did any marijuana.  Elevated electronic cigarettes.  Never use cocaine never used intravenous drug  use.   HOME and HOBBY DETAILS :  -[place]  Is lived there for 21 years.  Age of the home is 34 years.  In 2004 there was mold/mildew in the shower curtain.  And then there was mold under the shower.  This was torn down.  This was in 2004 but currently none.  She did some mulch back at work 2 months ago.  No humidifier use no CPAP use.  No nebulizer use.  No steam iron use.  No Jacuzzi use.  No misting Fountain in the house.  No pet birds or parakeets no gerbils.  No feather pillows.  No mold in the [facility] duct.  No music habits.   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  No otehrr organic antigen exposure that she recollects (MR side note: a different patient in 2016 reported that in 2013-2016 time lot of renovations there and mold +).  Patient reported on September 10, 2019: That between 1966 and 1980 she worked at [employer] and [place].  At that time there is lot of mold exposure in the building.  The building was cleaned out.  Also checked for inorganic antigen exposure and this is negative.  Never worked in a dusty environment.  Never exposed to fumes.   PULMONARY TOXICITY HISTORY (27 items): Use nitrofurantoin in 2019 and 2020.  This is described below.  Question if you can for this there is      Simple office walk 185 feet x  3 laps goal with forehead probe 08/19/2019   O2 used ra  Number laps completed Attempted 3 but desaturated at 2  Comments about pace Normal pace  Resting Pulse Ox/HR 100% and 101/min  Final Pulse Ox/HR 86% and 116/min  Desaturated </= 88% yes  Desaturated <= 3% points Yes , 14  Got Tachycardic >/= 90/min yes  Symptoms at end of test dyspneic  Miscellaneous comments Corrected with  2L [place]    Nuclear medicine cardiac stress test March 2016: Overall Impression:  Normal stress nuclear study with a small, mild,  fixed apical defect consistent with thinning; no ischemia. LV Wall Motion:  NL LV Function; NL Wall Motion   CT abdomen lung cuts September 2020: Reported as normal.  I personally visualized and retrospect looks hazy and whether this is atelectasis or some other findings I do not know.  Jul 19, 2019:: Personally visualized shows findings of ILD.  I  am not able to see a prior chest x-ray in the system or CT scan of the chest    Results for ELGIE, LANDINO "PAM" (MRN [id number]) as of 08/19/2019 15:18  Ref. Range 03/26/2019 13:45  Creatinine Latest Ref Range: 0.40 - 1.20 mg/dL 1.05   ROS - per HPI Results for ALLEXIS, BORDENAVE "PAM" (MRN [id number]) as of 08/19/2019 15:18  Ref. Range 03/26/2019 13:45  Hemoglobin Latest Ref Range: 12.0 - 15.0 g/dL 14.0     OV 09/10/2019  Subjective:  Patient ID: [name], female , DOB: June 18, 1945 , age 41 y.o. , MRN: [id number] , ADDRESS: [place] [address]   09/10/2019 -   Chief Complaint  Patient presents with   Follow-up    shortness of breath with exertion and non-productive cough     HPI ETHAN KASPERSKI 74 y.o. -presents for follow-up to discuss results of dyspnea work-up.  High-resolution CT chest was personally visualized.  It shows presence of interstitial lung disease.  It needs Fleischner criteria for diagnosis not consistent with UIP.  Per ATS this is alternative diagnosis to UIP.  Radiologist raised the concern of hypersensitive pneumonitis but there is no air-trapping.  The other possibilities NSIP.  She is no longer on nitrofurantoin.  In any event unit she only took it intermittently.  Overall she is stable since last visit.  She had autoimmune profile.  Is only trace positive for ANA.  Her IgE is slightly elevated.  Her PFT shows mild restriction with reduction in diffusion capacity.  We went over her exposure history again.  This time she did recollect being exposed to mold for 15 to 20 years up until 1980  while living in [place] and working at [employer].  She does not recollect mold at behavioral [facility] where she worked recently until she retired.  However another patient of mine did recollect mold in this building at [employer].   Low risk cardiac stress test in 2016 but with ejection fraction 54%.-Since then no overall change in health.  Husband inquired about the low ejection fraction.  In terms of symptoms: She is try to get portable oxygen system.  She is switching company to [employer] because of the shortage of portable oxygen systems at [facility].     OV 11/12/2019   Subjective:  Patient ID: [name], female , DOB: 08-04-1945, age 31 y.o. years. , MRN: [id number],  ADDRESS: [place] [address] PCP  Leamon Arnt, MD Providers : Treatment Team:  Attending Provider: Brand Males, MD Thoracic surgeon: Dr. Roxan Hockey Cardiologist Dr. Margaretann Loveless  Chief Complaint  Patient presents with   Follow-up    SOB    Follow-up interstitial lung disease not otherwise specified   HPI SACORA HAWBAKER 74 y.o. -returns for follow-up.  Her husband is with her.  After her last visit she saw cardiothoracic surgery but we had to hold the surgical lung biopsy because her echocardiogram from September 13, 2019 showed a drop in left ventricular systolic ejection fraction.  Since then she is seen cardiology.  She had a heart catheterization that shows ejection action 40% still low.  Nonobstructive coronary artery disease.  She is followed up with cardiology and she has been started on beta-blocker.  On her father medications was also adjusted.  So far no change in dyspnea.  She tells me overall dyspnea is the same without any change.  Symptom scores shows a two-point shift with worsening  but she told the medical assistant that she is feeling worse.  Walking desaturation test today shows - normalcy .  She wants portable oxygen but the first DME company [facility]  did not have portable oxygen tanks.  She is found another company [employer] that is able to give her portable oxygen.  She needs a requalifying walk that is documented below.  In terms of surgical lung biopsy: She did have some prominent mediastinal nodes.  It appears Dr. Roxan Hockey has informed them that he will do a EBUS for this.  They have also been told by cardiology that from a cardiac standpoint patient would be a suitable candidate for surgical lung biopsy.  The husband wanted to know if a surgical biopsy is indicated and also my perspective and safety.  SYMPTOM SCALE - ILD 08/19/2019  11/12/2019   O2 use ra ra  Shortness of Breath 0 -> 5 scale with 5 being worst (score 6 If unable to do)   At rest 1 1  Simple tasks - showers, clothes change, eating, shaving 2 4  Household (dishes, doing bed, laundry) 5 4  Shopping 5 5  Walking level at own pace 2 3  Walking up Stairs 5 5  Total (30-36) Dyspnea Score 20 22  How bad is your cough? x 2  How bad is your fatigue x 5  How bad is nausea x 1  How bad is vomiting?  x 1  How bad is diarrhea? x 0  How bad is anxiety? x 0  How bad is depression x 0     Simple office walk 185 feet x  3 laps goal with forehead probe 08/19/2019  11/12/2019   O2 used ra ra  Number laps completed Attempted 3 but desaturated at 2 Did alll 3 on this repeat walk  Comments about pace Normal pace   Resting Pulse Ox/HR 100% and 101/min 96% and 60/min  Final Pulse Ox/HR 86% and 116/min 94% and 80/min  Desaturated </= 88% yes no  Desaturated <= 3% points Yes , 14 no  Got Tachycardic >/= 90/min yes no  Symptoms at end of test dyspneic mild  Miscellaneous comments Corrected with  2L  Will return o2   ROS - per HPI  xxxxxxxxxxxxxxxxxxxxxxxxxx Results for ARIZONA, SORN "PAM" (MRN [id number]) as of 09/10/2019 13:56  Ref. Range 08/23/2019 10:53  FVC-Pre Latest Units: L 2.61  FVC-%Pred-Pre Latest Units: % 80  FEV1-Pre Latest Units: L 2.26  FEV1-%Pred-Pre  Latest Units: % 92  Results for NAZARIA, RIESEN "PAM" (MRN [id number]) as of 09/10/2019 13:56  Ref. Range 08/23/2019 10:53  DLCO cor Latest Units: ml/min/mmHg 13.76  DLCO cor % pred Latest Units: % 64   Results for ARMINDA, FOGLIO "PAM" (MRN [id number]) as of 09/10/2019 13:56  Ref. Range 08/23/2019 10:53  TLC Latest Units: L 4.15  TLC % pred Latest Units: % 75   xxxxxxxxxxxxxxxxxxxxxx  CT chest high resolution June 2021 Lungs/Pleura: Mild pulmonary fibrosis in a pattern with apical predominance, featuring irregular peripheral interstitial opacity and scattered areas of subpleural bronchiolectasis. Mild, tubular bronchiectasis throughout. No significant air trapping on expiratory phase imaging. No pleural effusion or pneumothorax.  Upper Abdomen: No acute abnormality.  Musculoskeletal: No chest wall mass or suspicious bone lesions identified.  IMPRESSION: 1. Mild pulmonary fibrosis in a pattern with apical predominance, featuring irregular peripheral interstitial opacity and scattered areas of subpleural bronchiolectasis. Mild, tubular bronchiectasis throughout. Findings are most consistent with an "alternative diagnosis" pattern by ATS  pulmonary fibrosis criteria, leading differential considerations chronic fibrotic hypersensitivity pneumonitis or NSIP. 2. Prominent mediastinal lymph nodes, nonspecific and likely reactive. 3. Coronary artery disease.  Aortic Atherosclerosis (ICD10-I70.0).   Electronically Signed   By: Eddie Candle M.D.   On: 09/04/2019 15:37    xxxxxxxxxxxxxxxxxxxxxxxx[id number]  Results for MACKENSI, [name] (MRN [id number]) as of 09/10/2019 13:56  Ref. Range 08/19/2019 16:30 08/19/2019 16:30  SEE BELOW Unknown  Comment  Anti Nuclear Antibody (ANA) Latest Ref Range: NEGATIVE  POSITIVE (A)   ANA Pattern 1 Unknown Cytoplasmic (A)   ANA Titer 1 Unknown 1:80 (H) Negative  Angiotensin-Converting Enzyme Latest Ref Range: 9 - 67  U/L 35   Anti JO-1 Latest Ref Range: 0.0 - 0.9 AI  <0.2  CENTROMERE AB SCREEN Latest Ref Range: 0.0 - 0.9 AI  <8.4  Cyclic Citrullin Peptide Ab Latest Units: UNITS <16   dsDNA Ab Latest Ref Range: 0 - 9 IU/mL  5  ENA RNP Ab Latest Ref Range: 0.0 - 0.9 AI  0.6  ENA SSA (RO) Ab Latest Ref Range: 0.0 - 0.9 AI  <0.2  ENA SSB (LA) Ab Latest Ref Range: 0.0 - 0.9 AI  <0.2  Myeloperoxidase Abs Latest Units: AI <1.0   Serine Protease 3 Latest Units: AI <1.0   RA Latex Turbid. Latest Ref Range: <14 IU/mL <14   ENA SM Ab Ser-aCnc Latest Ref Range: 0.0 - 0.9 AI  <0.2  Chromatin Ab SerPl-aCnc Latest Ref Range: 0.0 - 0.9 AI  <0.2     has a past medical history of Asthma, Depression, Emphysema of lung ([place]), Hemorrhoids, Hyperlipidemia, Hypertension, Hypothyroidism, Obesity, Rosacea, and Tubular adenoma of colon (03/26/2019).  IMPRESSIONS   ECHO 09/13/19 1. Left ventricular ejection fraction, by estimation, is 30 to 35%. The  left ventricle has moderately decreased function. The left ventricle  demonstrates global hypokinesis. Apex poorly visualized, consider repeat  limited echo with contrast to rule out  apical thrombus. The left ventricular internal cavity size was moderately  dilated. Left ventricular diastolic parameters are consistent with Grade I  diastolic dysfunction (impaired relaxation).  2. Right ventricular systolic function is normal. The right ventricular  size is normal. Tricuspid regurgitation signal is inadequate for assessing  PA pressure.  3. The mitral valve is normal in structure. Trivial mitral valve  regurgitation.  4. The aortic valve was not well visualized. Aortic valve regurgitation  is not visualized. No aortic stenosis is present.  CATH 8?20/21  Prox LAD to Mid LAD lesion is 30% stenosed.  Ost Cx to Prox Cx lesion is 35% stenosed.  Prox RCA lesion is 35% stenosed.  There is moderate left ventricular systolic dysfunction.  LV end diastolic pressure is  normal.  The left ventricular ejection fraction is 35-45% by visual estimate.   1. Nonobstructive CAD 2. Moderate LV dysfunction. EF estimated at 40% with global hypokinesis 3. Normal LV filling pressures 4. Normal right heart pressures 5. Normal cardiac output.   Plan: medical therapy for LV dysfunction and lipid lowering therapy for nonobstructive CAD.    ROS - per HPI     has a past medical history of Asthma, Depression, Emphysema of lung ([place]), GERD (gastroesophageal reflux disease), Hemorrhoids, History of kidney stones, Hyperlipidemia, Hypertension, Hypothyroidism, Obesity, Rosacea, and Tubular adenoma of colon (03/26/2019).   reports that she quit smoking about 6 years ago. Her smoking use included cigarettes. She has a 7.50 pack-year smoking history. She has never used smokeless tobacco.  Past Surgical History:  Procedure  Laterality Date   ANTERIOR AND POSTERIOR VAGINAL REPAIR  2020   CATARACT EXTRACTION W/ INTRAOCULAR LENS IMPLANT Bilateral 2016   COLONOSCOPY     CYSTOSCOPY     DENTAL SURGERY  2016   Dental Implant   FRACTURE SURGERY Left 2013   Left ankle   medtronix nerve stimulator  07/18/2019   Complete InterStim Sacral Neuromudulation system implantation 07/18/19, [facility]   NASAL SINUS SURGERY     RIGHT/LEFT HEART CATH AND CORONARY ANGIOGRAPHY N/A 11/01/2019   Procedure: RIGHT/LEFT HEART CATH AND CORONARY ANGIOGRAPHY;  Surgeon: Martinique, Peter M, MD;  Location: [facility];  Service: Cardiovascular;  Laterality: N/A;   VAGINECTOMY, PARTIAL      Allergies  Allergen Reactions   Beta Adrenergic Blockers Shortness Of Breath    Avoid due to COPD   Ace Inhibitors Cough   Clarithromycin     REACTION: GI sx   Codeine Nausea Only   Telmisartan Other (See Comments)    sleepy    Immunization History  Administered Date(s) Administered   Hepatitis A, Adult 04/06/2018   Influenza Split 01/06/2009   Influenza, High Dose Seasonal PF 12/17/2014,  12/20/2015, 01/15/2017, 12/18/2017, 11/28/2018   Influenza, Seasonal, Injecte, Preservative Fre 01/07/2014, 12/17/2014   Influenza,inj,Quad PF,6+ Mos 01/15/2017   Influenza,trivalent, recombinat, inj, PF 12/03/2012   Influenza-Unspecified 01/15/2017   Moderna SARS-COVID-2 Vaccination 04/17/2019, 05/15/2019   Pneumococcal Conjugate-13 01/07/2014   Pneumococcal Polysaccharide-23 11/23/2011   Td 03/14/2006, 10/16/2011   Zoster 12/06/2013   Zoster Recombinat (Shingrix) 10/21/2017, 12/18/2017    Family History  Problem Relation Age of Onset   Heart disease Mother    Hypertension Mother    Osteoporosis Mother    Stroke Mother    Alcohol abuse Father    Diabetes Maternal Grandmother    Drug abuse Son    Breast cancer Neg Hx      Current Outpatient Medications:    amLODipine (NORVASC) 5 MG tablet, Take 5 mg by mouth daily., Disp: , Rfl:    cephALEXin (KEFLEX) 250 MG capsule, Take 250 mg by mouth daily., Disp: , Rfl:    dapagliflozin propanediol (FARXIGA) 5 MG TABS tablet, Take 2 tablets (10 mg total) by mouth daily before breakfast., Disp: 30 tablet, Rfl: 11   fluticasone (FLONASE) 50 MCG/ACT nasal spray, Place 2 sprays into both nostrils at bedtime. , Disp: , Rfl:    levothyroxine (SYNTHROID) 137 MCG tablet, TAKE ONE TABLET BY MOUTH EVERY MORNING BEFORE BREAKFAST (Patient taking differently: Take 137 mcg by mouth daily before breakfast. ), Disp: 90 tablet, Rfl: 2   losartan (COZAAR) 100 MG tablet, Take 1 tablet (100 mg total) by mouth daily., Disp: 90 tablet, Rfl: 3   metoprolol succinate (TOPROL XL) 25 MG 24 hr tablet, Take 1 tablet (25 mg total) by mouth daily., Disp: 30 tablet, Rfl: 3   montelukast (SINGULAIR) 10 MG tablet, TAKE ONE TABLET BY MOUTH EVERY NIGHT AT BEDTIME (Patient taking differently: Take 10 mg by mouth at bedtime. ), Disp: 90 tablet, Rfl: 1   Multiple Vitamin (MULTIVITAMIN WITH MINERALS) TABS tablet, Take 1 tablet by mouth daily., Disp: ,  Rfl:    omeprazole (PRILOSEC) 20 MG capsule, TAKE ONE CAPSULE BY MOUTH DAILY (Patient taking differently: Take 20 mg by mouth daily. ), Disp: 90 capsule, Rfl: 0   rosuvastatin (CRESTOR) 10 MG tablet, Take 1 tablet (10 mg total) by mouth daily., Disp: 90 tablet, Rfl: 3   triamterene-hydrochlorothiazide (MAXZIDE-25) 37.5-25 MG tablet, Take 1 tablet by mouth daily.,  Disp: 90 tablet, Rfl: 3   venlafaxine XR (EFFEXOR-XR) 150 MG 24 hr capsule, TAKE ONE CAPSULE BY MOUTH DAILY (Patient taking differently: Take 150 mg by mouth daily with breakfast. ), Disp: 30 capsule, Rfl: 0  Current Facility-Administered Medications:    sodium chloride flush (NS) 0.9 % injection 3 mL, 3 mL, Intravenous, Q12H, Cherlynn Kaiser A, MD      Objective:   Vitals:   11/12/19 0930  BP: 122/72  Pulse: 72  Temp: 98.5 F (36.9 C)  TempSrc: Oral  SpO2: 98%  Weight: 200 lb 1.6 oz (90.8 kg)  Height: 5' 7" (1.702 m)    Estimated body mass index is 31.34 kg/m as calculated from the following:   Height as of this encounter: 5' 7" (1.702 m).   Weight as of this encounter: 200 lb 1.6 oz (90.8 kg).  _0 @  Filed Weights   11/12/19 0930  Weight: 200 lb 1.6 oz (90.8 kg)     Physical Exam Pleasant female with some varicose veins.  Mild basal crackles.  No stigmata of connective tissue disease.  Otherwise well.  Preop respiratory          Assessment:       ICD-10-CM   1. ILD (interstitial lung disease) ([place])  J84.9   2. Dyspnea on exertion  R06.00   3. Preop respiratory exam  Z01.811    #Interstitial lung disease: Interstitial lung disease was diagnosed at the time she had concomitant new onset cardiomyopathy.  Today walking desaturation test shows improvement.  It is nonobstructive cardiomyopathy.  The interim change has been that beta-blocker has been added.  Not sure the improvement in walking desaturation test is a reflection of improved interstitial lung disease findings [otherwise false  positive in the setting of cardiomyopathy].  I did notice that her heart rate is much better even at rest with the beta-blocker and the improved walking desaturation test could simply be a function of better heart rate control.  Nevertheless overall she is feeling that her symptoms still persist.  And also heard crackles.  Therefore I believe that she is to interstitial lung disease and it is persisting.  The best way to put this issue to rest is to get a high-resolution CT chest supine and prone with inspiratory and expiratory images.  If symptoms are unimproved or worse then she definitely need surgical lung biopsy.    Discussed with her and her husband the rationale for surgical lung biopsy would be to tease out non-- IPF varieties and also IPF if there is diagnostic uncertainty based on history and high-resolution CT chest and serology for which she meets criteria.  Explained to them in general terms about the risk for surgical lung biopsy.  They will discuss this further with Dr. Roxan Hockey.  However it appears that with better control of cardiomyopathy and cardiology clearance there is risk mitigation   Given improvement in the walking desaturation test will return her oxygen     Plan:     Patient Instructions     ICD-10-CM   1. ILD (interstitial lung disease) ([place])  J84.9   2. Dyspnea on exertion  R06.00   3. Preop respiratory exam  Z01.811     Noted new onset of cardiac issues. Glad you are under good care by Dr Margaretann Loveless  Plan Repeat walk test to qualify with [employer] for portable O2 Repeat HRCT supine and prone and inspiratory and expiratory phases   - if ILD still present/worse - > back to  Dr Roxan Hockey for lung biopsy       - we discussed rationale for biopsy      - I will send him a message   We will call her with the results of the CT chest before deciding on the next step   ( Level 05 visit: Estb 40-54 min   in  visit type: on-site physical face to visit  in total care  time and counseling or/and coordination of care by this undersigned MD - Dr Brand Males. This includes one or more of the following on this same day 11/12/2019: pre-charting, chart review, note writing, documentation discussion of test results, diagnostic or treatment recommendations, prognosis, risks and benefits of management options, instructions, education, compliance or risk-factor reduction. It excludes time spent by the [place] or office staff in the care of the patient. Actual time 43 min)   SIGNATURE    Dr. Brand Males, M.D., F.C.C.P,  Pulmonary and Critical Care Medicine Staff Physician, [place] Director - Interstitial Lung Disease  Program  Pulmonary [place] at [place], [place], [address]  Pager: [phone], If no answer or between  15:00h - 7:00h: call [phone] Telephone: [phone]  10:00 AM 11/12/2019

## 2019-11-12 NOTE — Telephone Encounter (Signed)
Pt is scheduled for an OV today 8/31 with MR. This can be addressed during that OV. Will keep encounter open until after pt's OV.

## 2019-11-12 NOTE — Telephone Encounter (Signed)
Pt was walked at [facility] today 8/31. All info can be seen during pt's OV. Nothing further needed.

## 2019-11-22 DIAGNOSIS — J449 Chronic obstructive pulmonary disease, unspecified: Secondary | ICD-10-CM | POA: Diagnosis not present

## 2019-11-23 NOTE — Progress Notes (Signed)
Patient ID: Susan Hunt                 DOB: [birth year]                      MRN: [id number]     HPI: Susan Hunt is a 74 y.o. female referred by Dr. Margaretann Loveless to HF clinic. PMH is significant for HFrEF (LVEF 30-35%), NICM with normal R and L heart pressures, HTN, HLD, and interstitial lung disease with pulmonary fibrosis.  She was last seen by Dr. Margaretann Loveless on 11/11/10 where her BP was 126/74 and HR 91. She was started on metoprolol succinate and dose of Farxiga was increased to 10 mg daily. She was then referred to clinic for initiation of Entresto.   Patient presents today with spouse in good spirits. She is a retired Marine scientist and her husband is a retired Software engineer as well. BP today is controlled at 128/70. HR is lower at 55 today after starting metoprolol. She does have increased fatigue since starting metoprolol but is willing to continue medication for now. She complains of continued dyspnea on exertion that is chronic and not worsening. She also reports occasional dizziness when walking around, but this is not new. Denies orthopnea or edema. Her home weights have been stable at home around 200 lbs. She does not monitor her BP at home but has a BP cuff. She has spoken with her insurance and is agreeable to the $40-60 copay with Entresto.  Current HF/HTN meds: losartan 100 mg daily, metoprolol succinate 25 mg daily, triamterene-hctz 37.5/25 mg daily, amlodipine 5 mg daily, dapagliflozin 10 mg daily  Previously tried: lisinopril-hctz (SOB, coughing), metoprolol tartrate (SOB, coughing), clonidine  BP goal: <130/80 mmHg  Family History: none 2-3 mixed drinks, former smoker quit 10 years ago  Social History: 2-3 mixed drinks weekly, former smoker (quit 10 years ago)  Diet: Does not add salt to food or cook with salt. Enjoys eating tomato-based foods. Does not eat breakfast. Eats lunch/dinner at 3-4 pm daily. At an egg salad sandwich yesterday. Eats out at restaurants often (I.e. [place]) but avoids fast food.   Exercise: limited due to dyspnea - walks dog (halfway around block)  Home BP readings: does not measure BP at home but has BP cuff  Wt Readings from Last 3 Encounters:  11/12/19 200 lb 1.6 oz (90.8 kg)  11/11/19 199 lb (90.3 kg)  11/01/19 200 lb (90.7 kg)   BP Readings from Last 3 Encounters:  11/12/19 122/72  11/11/19 126/74  11/01/19 (!) 144/86   Pulse Readings from Last 3 Encounters:  11/12/19 72  11/11/19 91  11/01/19 76    Renal function: CrCl cannot be calculated (Patient's most recent lab result is older than the maximum 21 days allowed.).  Past Medical History:  Diagnosis Date  . Asthma    "I dont think I have it.  . Depression   . Emphysema of lung ([place])   . GERD (gastroesophageal reflux disease)   . Hemorrhoids   . History of kidney stones    Cystoscopy  . Hyperlipidemia   . Hypertension   . Hypothyroidism   . Obesity   . Rosacea   . Tubular adenoma of colon 03/26/2019   Colonoscopy repeat in 3 years    Current Outpatient Medications on File Prior to Visit  Medication Sig Dispense Refill  . amLODipine (NORVASC) 5 MG tablet Take 5 mg by mouth daily.    . cephALEXin (  KEFLEX) 250 MG capsule Take 250 mg by mouth daily.    . dapagliflozin propanediol (FARXIGA) 5 MG TABS tablet Take 2 tablets (10 mg total) by mouth daily before breakfast. 60 tablet 11  . fluticasone (FLONASE) 50 MCG/ACT nasal spray Place 2 sprays into both nostrils at bedtime.     Marland Kitchen levothyroxine (SYNTHROID) 137 MCG tablet TAKE ONE TABLET BY MOUTH EVERY MORNING BEFORE BREAKFAST (Patient taking differently: Take 137 mcg by mouth daily before breakfast. ) 90 tablet 2  . losartan (COZAAR) 100 MG tablet Take 1 tablet (100 mg total) by mouth daily. 90 tablet 3  . metoprolol succinate (TOPROL XL) 25 MG 24 hr tablet Take 1 tablet (25 mg total) by mouth daily. 30 tablet 3  . montelukast (SINGULAIR) 10 MG tablet TAKE ONE TABLET BY MOUTH EVERY NIGHT AT BEDTIME (Patient  taking differently: Take 10 mg by mouth at bedtime. ) 90 tablet 1  . Multiple Vitamin (MULTIVITAMIN WITH MINERALS) TABS tablet Take 1 tablet by mouth daily.    Marland Kitchen omeprazole (PRILOSEC) 20 MG capsule TAKE ONE CAPSULE BY MOUTH DAILY (Patient taking differently: Take 20 mg by mouth daily. ) 90 capsule 0  . rosuvastatin (CRESTOR) 10 MG tablet Take 1 tablet (10 mg total) by mouth daily. 90 tablet 3  . triamterene-hydrochlorothiazide (MAXZIDE-25) 37.5-25 MG tablet Take 1 tablet by mouth daily. 90 tablet 3  . venlafaxine XR (EFFEXOR-XR) 150 MG 24 hr capsule TAKE ONE CAPSULE BY MOUTH DAILY (Patient taking differently: Take 150 mg by mouth daily with breakfast. ) 30 capsule 0   Current Facility-Administered Medications on File Prior to Visit  Medication Dose Route Frequency Provider Last Rate Last Admin  . sodium chloride flush (NS) 0.9 % injection 3 mL  3 mL Intravenous Q12H Elouise Munroe, MD        Allergies  Allergen Reactions  . Beta Adrenergic Blockers Shortness Of Breath    Avoid due to COPD  . Ace Inhibitors Cough  . Clarithromycin     REACTION: GI sx  . Codeine Nausea Only  . Telmisartan Other (See Comments)    sleepy     Assessment/Plan:  1. HFrEF - BP today is controlled at 128/70, HR lower at 55 bpm today. Start Entresto 49/51 mg BID and stop losartan and triamterene-hctz. Will also stop amlodipine given decreased benefit in HFrEF and increased risk of side effects. Continue metoprolol succinate 25 mg daily and dapagliflozin (Farxiga) 10 mg daily. Consider adding spironolactone at next visit. Patient given 30-day free card and patient assistance program application for Entresto. Educated patient on proper BP measuring technique. She will start to measure BP daily 2-3 hours after morning medications and bring BP log to next visit. Encouraged patient to increase exercise activity as able and to limit sodium intake in diet. Will follow-up BP and BMET in follow-up visit on  12/10/19.  Richardine Service, PharmD PGY2 Cardiology Pharmacy Resident

## 2019-11-25 ENCOUNTER — Ambulatory Visit (INDEPENDENT_AMBULATORY_CARE_PROVIDER_SITE_OTHER): Payer: Medicare Other | Admitting: Pharmacist

## 2019-11-25 ENCOUNTER — Other Ambulatory Visit: Payer: Self-pay

## 2019-11-25 VITALS — BP 128/70 | HR 55

## 2019-11-25 DIAGNOSIS — I5020 Unspecified systolic (congestive) heart failure: Secondary | ICD-10-CM

## 2019-11-25 MED ORDER — ENTRESTO 49-51 MG PO TABS: 1.0000 | ORAL_TABLET | Freq: Two times a day (BID) | ORAL | 11 refills | Status: DC

## 2019-11-25 NOTE — Patient Instructions (Addendum)
It was nice to meet you today   Your blood pressure goal is less than 130/65mHg   STOP taking losartan, triamterene-hydrochlorothiazide, and amlodipine.  START taking Entresto 49/51 mg twice daily  CONTINUE taking metoprolol succinate 25 mg daily and dapagliflozin 10 mg daily  Please monitor your blood pressure at home. Bring your readings and your home cuff to your next appointment.  We will see you at 1:30 pm on Tuesday, 9/28.

## 2019-11-26 ENCOUNTER — Ambulatory Visit: Payer: Medicare Other | Admitting: Internal Medicine

## 2019-11-29 ENCOUNTER — Ambulatory Visit (INDEPENDENT_AMBULATORY_CARE_PROVIDER_SITE_OTHER)
Admission: RE | Admit: 2019-11-29 | Discharge: 2019-11-29 | Disposition: A | Discharge status: Home / Self Care | Payer: Medicare Other | Source: Ambulatory Visit | Attending: Internal Medicine | Admitting: Internal Medicine

## 2019-11-29 ENCOUNTER — Other Ambulatory Visit: Payer: Self-pay

## 2019-11-29 ENCOUNTER — Other Ambulatory Visit: Payer: Self-pay | Admitting: Family Medicine

## 2019-11-29 DIAGNOSIS — J849 Interstitial pulmonary disease, unspecified: Secondary | ICD-10-CM | POA: Diagnosis not present

## 2019-11-29 DIAGNOSIS — I2510 Atherosclerotic heart disease of native coronary artery without angina pectoris: Secondary | ICD-10-CM | POA: Diagnosis not present

## 2019-11-29 DIAGNOSIS — R0609 Other forms of dyspnea: Secondary | ICD-10-CM

## 2019-11-29 DIAGNOSIS — J439 Emphysema, unspecified: Secondary | ICD-10-CM | POA: Diagnosis not present

## 2019-11-29 DIAGNOSIS — I700 Atherosclerosis of aorta: Secondary | ICD-10-CM | POA: Diagnosis not present

## 2019-11-29 DIAGNOSIS — R0600 Dyspnea, unspecified: Secondary | ICD-10-CM

## 2019-11-29 NOTE — Telephone Encounter (Signed)
Dr Chase Caller, Patient sent you this message this afternoon.   I had my scan this morning.  What next   I do not have follow.up appointment.   Message routed to Dr Chase Caller

## 2019-12-03 NOTE — Telephone Encounter (Signed)
I spoke with the pt about her results and recommendations per Dr Chase Caller  She verbalized understanding  No need for call back from him  Will go ahead and refer back to [facility] and then hold here in triage until we know date of her appt with Roxan Hockey and then will forward back to MR

## 2019-12-03 NOTE — Telephone Encounter (Signed)
Dr Chase Caller could you please advise on pt's CT results? Thank you

## 2019-12-03 NOTE — Telephone Encounter (Signed)
Followup appt was going to be  Based on CT findings. I just tried to call patient Susan Hunt  on her cell phone but went to [employer] -> a) Pulmonary findings have not resolved despite diagnosis and Rx of systolic CHF -> this means ILD + -> needs biopsy -> re-refer Dr Roxan Hockey  B) reason her oxygenation with walking was improved at Last OV was because of better HR control with beta blocker  C) Triage pls let me know once referral date/time with DR Roxan Hockey is set - I will then contact him/his office and also d/w Dr Margaretann Loveless of card  D) If patient want to talk to me I can call later this week in afternoon or next week (I am in [place](   D) pls send message back    CT Chest High Resolution  Result Date: 11/29/2019 CLINICAL DATA:  Interstitial lung disease, shortness of breath, emphysema EXAM: CT CHEST WITHOUT CONTRAST TECHNIQUE: Multidetector CT imaging of the chest was performed following the standard protocol without intravenous contrast. High resolution imaging of the lungs, as well as inspiratory and expiratory imaging, was performed. COMPARISON:  09/04/2019 FINDINGS: Cardiovascular: Aortic atherosclerosis. Cardiomegaly. Three-vessel coronary artery calcifications. No pericardial effusion. Mediastinum/Nodes: Unchanged prominent mediastinal lymph nodes, again likely reactive. Small hiatal hernia. Thyroid gland, trachea, and esophagus demonstrate no significant findings. Lungs/Pleura: No significant interval change in a pattern of mild pulmonary fibrosis, with a slight apical predominance, primarily featuring irregular peripheral interstitial opacity and mild, tubular bronchiectasis, with some scattered areas of subpleural bronchiolectasis. No evidence of honeycombing. Mild lobular air trapping on expiratory phase imaging, more evident than on prior examination, likely due to improved expiratory effort. No pleural effusion or pneumothorax. Upper Abdomen: No acute abnormality.  Musculoskeletal: No chest wall mass or suspicious bone lesions identified. IMPRESSION: 1. No significant interval change in a pattern of mild pulmonary fibrosis, with a slight apical predominance, primarily featuring irregular peripheral interstitial opacity and mild, tubular bronchiectasis, with some scattered areas of subpleural bronchiolectasis. No evidence of honeycombing. Mild lobular air trapping on expiratory phase imaging, more evident than on prior examination, likely due to improved expiratory effort. Findings are consistent with an "alternative diagnosis" pattern of fibrosis by ATS pulmonary fibrosis criteria, findings suggesting chronic fibrosis hypersensitivity pneumonitis given the presence of air trapping or NSIP. 2. Cardiomegaly and coronary artery disease. 3. Aortic Atherosclerosis (ICD10-I70.0). Electronically Signed   By: AEddie CandleM.D.   On: 11/29/2019 15:01

## 2019-12-05 NOTE — Telephone Encounter (Signed)
Pt has a follow up appointment scheduled with Dr. Roxan Hockey with [facility] 12/09/19 at 2:45.  Routing this info to MR.

## 2019-12-09 ENCOUNTER — Encounter: Payer: Self-pay | Admitting: Thoracic Surgery (Cardiothoracic Vascular Surgery)

## 2019-12-09 ENCOUNTER — Ambulatory Visit (INDEPENDENT_AMBULATORY_CARE_PROVIDER_SITE_OTHER): Payer: Medicare Other | Admitting: Family Medicine

## 2019-12-09 ENCOUNTER — Telehealth: Payer: Self-pay | Admitting: Internal Medicine

## 2019-12-09 ENCOUNTER — Other Ambulatory Visit: Payer: Self-pay | Admitting: *Deleted

## 2019-12-09 ENCOUNTER — Ambulatory Visit: Payer: Medicare Other | Admitting: Thoracic Surgery (Cardiothoracic Vascular Surgery)

## 2019-12-09 ENCOUNTER — Other Ambulatory Visit: Payer: Self-pay

## 2019-12-09 ENCOUNTER — Encounter: Payer: Self-pay | Admitting: Family Medicine

## 2019-12-09 VITALS — BP 160/78 | HR 71 | Temp 98.6°F | Resp 16 | Ht 67.0 in | Wt 201.2 lb

## 2019-12-09 VITALS — BP 170/84 | HR 70 | Resp 20 | Ht 67.0 in | Wt 200.0 lb

## 2019-12-09 DIAGNOSIS — K760 Fatty (change of) liver, not elsewhere classified: Secondary | ICD-10-CM | POA: Diagnosis not present

## 2019-12-09 DIAGNOSIS — Z23 Encounter for immunization: Secondary | ICD-10-CM

## 2019-12-09 DIAGNOSIS — J849 Interstitial pulmonary disease, unspecified: Secondary | ICD-10-CM

## 2019-12-09 DIAGNOSIS — E039 Hypothyroidism, unspecified: Secondary | ICD-10-CM

## 2019-12-09 DIAGNOSIS — I5020 Unspecified systolic (congestive) heart failure: Secondary | ICD-10-CM | POA: Diagnosis not present

## 2019-12-09 DIAGNOSIS — I10 Essential (primary) hypertension: Secondary | ICD-10-CM

## 2019-12-09 DIAGNOSIS — E782 Mixed hyperlipidemia: Secondary | ICD-10-CM | POA: Diagnosis not present

## 2019-12-09 DIAGNOSIS — E119 Type 2 diabetes mellitus without complications: Secondary | ICD-10-CM

## 2019-12-09 DIAGNOSIS — F329 Major depressive disorder, single episode, unspecified: Secondary | ICD-10-CM

## 2019-12-09 LAB — POCT GLYCOSYLATED HEMOGLOBIN (HGB A1C): Hemoglobin A1C: 6.1 % — AB (ref 4.0–5.6)

## 2019-12-09 MED ORDER — VENLAFAXINE HCL ER 150 MG PO CP24
150.0000 mg | ORAL_CAPSULE | Freq: Every day | ORAL | 3 refills | Status: DC
Start: 2019-12-09 — End: 2020-12-11

## 2019-12-09 NOTE — Progress Notes (Deleted)
Patient ID: Susan Hunt                 DOB: [birth year]                      MRN: [id number]     HPI: Susan Hunt is a 74 y.o. female referred by Dr. Margaretann Loveless to HF clinic. PMH is significant for HFrEF (LVEF 30-35%), NICM with normal R and L heart pressures, HTN, HLD, and interstitial lung disease with pulmonary fibrosis.  At last visit on 11/25/19 in HF clinic, BP controlled however HR was low at 55 bpm after starting Toprol XL 25 mg daily. Patient was started on Entresto 49/51 mg BID and losartan, triamterene-HCTZ, and amlodipine were discontinued. Toprol XL and Wilder Glade were continued.   Patient presents today in good spirits for 2-week HF follow-up. Reports ***  Today she returns to pharmacy clinic for further medication titration. At last visit with MD ***. Symptomatically, she is feeling ***, *** dizziness, lightheadedness, and fatigue. *** chest pain or palpitations. Feels SOB when ***. Able to complete all ADLs. Activity level ***. She *** checks her weight at home (normal range *** - *** lbs). *** LEE, PND, or orthopnea. Appetite has been ***. She *** adheres to a low-salt diet.  Check BMET today  Clinic BP? Compliance? Took meds this today? When do you take your meds? Dizziness, headaches, SOB, blurred vision? Fatigued improved with metoprolol? History of swelling? Weight changes? -clinic weight = -checking weight at home?? -salt, fluid restrictions (no more than 2L a day) Home BP logs?  HR?? Go over BP goals (<130/80) Talk about additional BP/CHF therapy if needed  . Titrate Entresto to 97/103 BID if BP room . Titrate Toprol XL to 50 mg daily if HR room . Add spironolactone 12.5 mg daily  Diet Exercise??   Current CHF/HTN meds:   Entresto 49/51 mg BID  Metoprolol succinate 25 mg daily  Dapagliflozin 10 mg daily  Previously tried: lisinopril-hctz (SOB, coughing), metoprolol tartrate (SOB, coughing), clonidine, triamterene-HCTZ, losartan, amlodipine    BP goal: <130/80 mmHg  Family History: none  Social History: 2-3 mixed drinks weekly, former smoker (quit 10 years ago)  Diet: Does not add salt to food or cook with salt. Enjoys eating tomato-based foods. Does not eat breakfast. Eats lunch/dinner at 3-4 pm daily. At an egg salad sandwich yesterday. Eats out at restaurants often (I.e. [place]) but avoids fast food.  Exercise: limited due to dyspnea - walks dog (halfway around block)  Home BP readings:   Wt Readings from Last 3 Encounters:  11/12/19 200 lb 1.6 oz (90.8 kg)  11/11/19 199 lb (90.3 kg)  11/01/19 200 lb (90.7 kg)   BP Readings from Last 3 Encounters:  11/25/19 128/70  11/12/19 122/72  11/11/19 126/74   Pulse Readings from Last 3 Encounters:  11/25/19 (!) 55  11/12/19 72  11/11/19 91    Renal function: CrCl cannot be calculated (Patient's most recent lab result is older than the maximum 21 days allowed.).  Past Medical History:  Diagnosis Date  . Asthma    "I dont think I have it.  . Depression   . Emphysema of lung ([place])   . GERD (gastroesophageal reflux disease)   . Hemorrhoids   . History of kidney stones    Cystoscopy  . Hyperlipidemia   . Hypertension   . Hypothyroidism   . Obesity   . Rosacea   . Tubular adenoma of colon  03/26/2019   Colonoscopy repeat in 3 years    Current Outpatient Medications on File Prior to Visit  Medication Sig Dispense Refill  . cephALEXin (KEFLEX) 250 MG capsule Take 250 mg by mouth daily.    . dapagliflozin propanediol (FARXIGA) 5 MG TABS tablet Take 2 tablets (10 mg total) by mouth daily before breakfast. 60 tablet 11  . fluticasone (FLONASE) 50 MCG/ACT nasal spray Place 2 sprays into both nostrils at bedtime.     Marland Kitchen levothyroxine (SYNTHROID) 137 MCG tablet TAKE ONE TABLET BY MOUTH EVERY MORNING BEFORE BREAKFAST (Patient taking differently: Take 137 mcg by mouth daily before breakfast. ) 90 tablet 2  . metoprolol succinate (TOPROL XL) 25 MG 24 hr tablet  Take 1 tablet (25 mg total) by mouth daily. 30 tablet 3  . montelukast (SINGULAIR) 10 MG tablet TAKE ONE TABLET BY MOUTH EVERY NIGHT AT BEDTIME (Patient taking differently: Take 10 mg by mouth at bedtime. ) 90 tablet 1  . Multiple Vitamin (MULTIVITAMIN WITH MINERALS) TABS tablet Take 1 tablet by mouth daily.    Marland Kitchen omeprazole (PRILOSEC) 20 MG capsule TAKE ONE CAPSULE BY MOUTH DAILY (Patient taking differently: Take 20 mg by mouth daily. ) 90 capsule 0  . rosuvastatin (CRESTOR) 10 MG tablet Take 1 tablet (10 mg total) by mouth daily. 90 tablet 3  . sacubitril-valsartan (ENTRESTO) 49-51 MG Take 1 tablet by mouth 2 (two) times daily. 60 tablet 11  . venlafaxine XR (EFFEXOR-XR) 150 MG 24 hr capsule TAKE ONE CAPSULE BY MOUTH DAILY 15 capsule 0   Current Facility-Administered Medications on File Prior to Visit  Medication Dose Route Frequency Provider Last Rate Last Admin  . sodium chloride flush (NS) 0.9 % injection 3 mL  3 mL Intravenous Q12H Elouise Munroe, MD        Allergies  Allergen Reactions  . Beta Adrenergic Blockers Shortness Of Breath    Avoid due to COPD  . Ace Inhibitors Cough  . Clarithromycin     REACTION: GI sx  . Codeine Nausea Only  . Telmisartan Other (See Comments)    sleepy     Assessment/Plan:  1. CHF -   Lorel Monaco, PharmD PGY2 [facility] [address], [place], [place] [address] Phone: [phone]; Fax: [phone]

## 2019-12-09 NOTE — Telephone Encounter (Signed)
I think this was already addressed. She is seeing Dr Roxan Hockey today. Will close message

## 2019-12-09 NOTE — Patient Instructions (Signed)
Please return in 6 months for your annual complete physical; please come fasting and follow up diabetes.   Glad you are doing better.   If you have any questions or concerns, please don't hesitate to send me a message via MyChart or call the office at [phone]. Thank you for visiting with [place] today! It's our pleasure caring for you.

## 2019-12-09 NOTE — Progress Notes (Signed)
[address]       ,[place] [address]             [phone]     HPI: Mrs. Graybeal returns to discuss possible lung biopsy.  Aristea Posada is a 74 year old woman with history of hypertension, hyperlipidemia, asthma, obesity, hypothyroidism, depression, nonischemic cardiomyopathy, interstitial lung disease.  She has been having shortness of breath dating back about a year.  She had been started on nitrofurantoin for recurrent UTI.  Stopping that medication did not improve her symptoms.  Initially she thought it might be COPD but treatment for that was not helpful.  She was referred to Dr. Chase Caller.  CT of the chest showed interstitial lung disease with some pulmonary fibrosis.  The findings were nonspecific.  I saw her in July and we were considering lung biopsy at that time.  She was found to have a reduced ejection fraction on echocardiogram.  She was referred to Dr. Margaretann Loveless.  Cardiac catheterization showed no significant CAD.  Her EF was about 30 to 35%.  She was started on Entresto and has had significant improvement in her symptoms since then.  She recently had a follow-up CT which showed persistent interstitial changes.  Zubrod Score: At the time of surgery this patients most appropriate activity status/level should be described as: _0     0    Normal activity, no symptoms _1     1    Restricted in physical strenuous activity but ambulatory, able to do out light work _2     2    Ambulatory and capable of self care, unable to do work activities, up and about >50 % of waking hours                              _3     3    Only limited self care, in bed greater than 50% of waking hours _4     4    Completely disabled, no self care, confined to bed or chair _5     5    Moribund  Current Outpatient Medications  Medication Sig Dispense Refill   dapagliflozin propanediol (FARXIGA) 5 MG TABS tablet Take 2 tablets (10 mg total) by mouth daily before breakfast. 60 tablet  11   fluticasone (FLONASE) 50 MCG/ACT nasal spray Place 2 sprays into both nostrils at bedtime.      levothyroxine (SYNTHROID) 137 MCG tablet TAKE ONE TABLET BY MOUTH EVERY MORNING BEFORE BREAKFAST (Patient taking differently: Take 137 mcg by mouth daily before breakfast. ) 90 tablet 2   metoprolol succinate (TOPROL XL) 25 MG 24 hr tablet Take 1 tablet (25 mg total) by mouth daily. 30 tablet 3   montelukast (SINGULAIR) 10 MG tablet TAKE ONE TABLET BY MOUTH EVERY NIGHT AT BEDTIME (Patient taking differently: Take 10 mg by mouth at bedtime. ) 90 tablet 1   Multiple Vitamin (MULTIVITAMIN WITH MINERALS) TABS tablet Take 1 tablet by mouth daily.     omeprazole (PRILOSEC) 20 MG capsule TAKE ONE CAPSULE BY MOUTH DAILY (Patient taking differently: Take 20 mg by mouth daily. ) 90 capsule 0   rosuvastatin (CRESTOR) 10 MG tablet Take 1 tablet (10 mg total) by mouth daily. 90 tablet 3   sacubitril-valsartan (ENTRESTO) 49-51 MG Take 1 tablet by mouth 2 (two) times daily. 60 tablet 11   venlafaxine XR (EFFEXOR-XR) 150 MG 24 hr capsule TAKE ONE CAPSULE BY MOUTH DAILY 15  capsule 0   cephALEXin (KEFLEX) 250 MG capsule Take 250 mg by mouth daily. (Patient not taking: Reported on 12/09/2019)     Current Facility-Administered Medications  Medication Dose Route Frequency Provider Last Rate Last Admin   sodium chloride flush (NS) 0.9 % injection 3 mL  3 mL Intravenous Q12H Elouise Munroe, MD        Physical Exam BP (!) 170/84    Pulse 70    Resp 20    Ht _0  (1.702 m)    Wt 200 lb (90.7 kg)    SpO2 95% Comment: RA   BMI 31.78 kg/m  74 year old woman in no acute distress Alert and oriented x3 with no focal deficits Obese No cervical or supraclavicular adenopathy Lungs faint crackles at bases Cardiac regular rate and rhythm No peripheral edema  Diagnostic Tests: CT CHEST WITHOUT CONTRAST  TECHNIQUE: Multidetector CT imaging of the chest was performed following the standard protocol without  intravenous contrast. High resolution imaging of the lungs, as well as inspiratory and expiratory imaging, was performed.  COMPARISON:  09/04/2019  FINDINGS: Cardiovascular: Aortic atherosclerosis. Cardiomegaly. Three-vessel coronary artery calcifications. No pericardial effusion.  Mediastinum/Nodes: Unchanged prominent mediastinal lymph nodes, again likely reactive. Small hiatal hernia. Thyroid gland, trachea, and esophagus demonstrate no significant findings.  Lungs/Pleura: No significant interval change in a pattern of mild pulmonary fibrosis, with a slight apical predominance, primarily featuring irregular peripheral interstitial opacity and mild, tubular bronchiectasis, with some scattered areas of subpleural bronchiolectasis. No evidence of honeycombing. Mild lobular air trapping on expiratory phase imaging, more evident than on prior examination, likely due to improved expiratory effort. No pleural effusion or pneumothorax.  Upper Abdomen: No acute abnormality.  Musculoskeletal: No chest wall mass or suspicious bone lesions identified.  IMPRESSION: 1. No significant interval change in a pattern of mild pulmonary fibrosis, with a slight apical predominance, primarily featuring irregular peripheral interstitial opacity and mild, tubular bronchiectasis, with some scattered areas of subpleural bronchiolectasis. No evidence of honeycombing. Mild lobular air trapping on expiratory phase imaging, more evident than on prior examination, likely due to improved expiratory effort. Findings are consistent with an "alternative diagnosis" pattern of fibrosis by ATS pulmonary fibrosis criteria, findings suggesting chronic fibrosis hypersensitivity pneumonitis given the presence of air trapping or NSIP. 2. Cardiomegaly and coronary artery disease. 3. Aortic Atherosclerosis (ICD10-I70.0).   Electronically Signed   By: Eddie Candle M.D.   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  She has been having trouble shortness of breath for about a year now.  She has been on and off beta-blocker and ACE inhibitor previously.  Her symptoms currently are improved with Entresto and Toprol-XL.  A recent CT showed persistent interstitial changes and mild pulmonary fibrosis.  Dr.Ramaswamy requested lung biopsy for diagnostic purposes to assist with management of her ILD.  I recommended to Mrs. Nilsson that we proceed with bronchoscopy for BAL for cell count and culture and right robotic VATS for lung biopsy and lymph node biopsy.  She understands this is diagnostic in nature and not therapeutic.  I informed her of the general nature of the procedure including the incisions to be used, the use of a drainage tube postoperatively, the expected hospital stay, and the overall recovery.  I informed her and her husband of the  indications, risks, benefits, and alternatives.  They understand the risks include, but not limited to death, MI, DVT, PE, bleeding, possible need for transfusion, infection, air leak, irregular heart rhythms, as well as possibility of other unforeseeable complications.  She accepts the risks and wishes to proceed.   Plan: Bronchoscopy for BAL for cell count and culture, right robotic VATS for lung biopsy and lymph node biopsy on Friday, 12/13/2019.   Melrose Nakayama, MD [facility] [phone]

## 2019-12-09 NOTE — Progress Notes (Signed)
Subjective  CC:  Chief Complaint  Patient presents with  . Diabetes  . Hypertension  . Interstitial Lung Disease  . Congestive Heart Failure    HPI: Susan Hunt is a 74 y.o. female who presents to the office today for follow up of diabetes and problems listed above in the chief complaint.   Diabetes follow up: Her diabetic control is reported as Unchanged.  She is tolerating her medications and Wilder Glade was recently increased to 10 mg.  Her diet is fair.  No symptoms of hyperglycemia. She denies exertional CP or SOB or symptomatic hypoglycemia. She denies foot sores or paresthesias.   I reviewed multiple notes from pulmonology and cardiology.  We reviewed together her history.  She was referred to neurology due to her worsening dyspnea.  Since, she has been diagnosed with interstitial lung disease and nonischemic cardiomyopathy.  Fortunately, heart failure treatment has improved her symptoms significantly.  She is now much more active and able to complete her activities of daily living, grocery shop, walk the dogs etc.  She is to undergo a lung biopsy and BAL for further determination of the type of ILD so they can treat her.  She is fearful of the procedure.  EF was 35 to 45%.  Repeat echo is scheduled to see if she has regained function.  Hypertension: Has been well controlled however mildly elevated in the office today.  Recent addition of [name] has been difficult because she is having facial itching.  She will follow-up with cardiology to adjust his medications.  Reviewed her cardiac cath which was nonobstructive.  Hyperlipidemia now tolerating low-dose statin.  Nonfasting for recheck  Hypothyroidism has been well controlled.  Her energy level has improved.  Continues to take her medications as prescribed.  Chronic major depression is well controlled.  She has handled this difficulty very well.  She feels great.  She is on Effexor long-term.  Wt Readings from Last 3  Encounters:  12/09/19 201 lb 3.2 oz (91.3 kg)  12/09/19 200 lb (90.7 kg)  11/12/19 200 lb 1.6 oz (90.8 kg)    BP Readings from Last 3 Encounters:  12/09/19 (!) 160/78  12/09/19 (!) 170/84  11/25/19 128/70    Assessment  1. Controlled type 2 diabetes mellitus without complication, without long-term current use of insulin ([place])   2. Essential hypertension   3. HFrEF (heart failure with reduced ejection fraction) ([place])   4. Interstitial lung disease ([place])   5. Major depression, chronic   6. Mixed hyperlipidemia   7. Hepatic steatosis   8. Acquired hypothyroidism   9. Need for immunization against influenza      Plan   Diabetes is currently very well controlled.  Continue Farxiga and Metformin.  Continue diabetic diet.  Flu shot updated today.  Eye exam up-to-date.  Check renal function and urine.  Hypertension: Mildly elevated today but had been low.  Cardiology is following along closely well titrating up Entresto.  No medication adjustment made today.  Heart failure with reduced ejection fraction: Per heart failure clinic.  Adjusting medications.  Follow-up echo pending.  Treatment has significantly improved her dyspnea.  Interstitial lung disease, unspecified: Could be due to Macrodantin or prior environmental exposure.  Pending lung biopsy  Stable major depression: Refill medication, Effexor Exar 150 mg daily  Recheck lipid, nonfasting on statin  History of elevated FTs and hepatic steatosis: Recheck and monitor while on statin.  Asymptomatic  Hypothyroidism due for recheck.  Clinically euthyroid.  Follow  up: 6 months for complete physical and follow-up of chronic medical problems. Orders Placed This Encounter  Procedures  . Flu Vaccine QUAD High Dose(Fluad)  . Lipid panel  . CBC with Differential/Platelet  . COMPLETE METABOLIC PANEL WITH GFR  . TSH  . POCT HgB A1C   Meds ordered this encounter  Medications  . venlafaxine XR (EFFEXOR-XR) 150 MG 24 hr capsule     Sig: Take 1 capsule (150 mg total) by mouth daily.    Dispense:  90 capsule    Refill:  3      Immunization History  Administered Date(s) Administered  . Fluad Quad(high Dose 65+) 12/09/2019  . Hepatitis A, Adult 04/06/2018  . Influenza Split 01/06/2009  . Influenza, High Dose Seasonal PF 12/17/2014, 12/20/2015, 01/15/2017, 12/18/2017, 11/28/2018  . Influenza, Seasonal, Injecte, Preservative Fre 01/07/2014, 12/17/2014  . Influenza,inj,Quad PF,6+ Mos 01/15/2017  . Influenza,trivalent, recombinat, inj, PF 12/03/2012  . Influenza-Unspecified 01/15/2017  . Moderna SARS-COVID-2 Vaccination 04/17/2019, 05/15/2019  . Pneumococcal Conjugate-13 01/07/2014  . Pneumococcal Polysaccharide-23 11/23/2011  . Td 03/14/2006, 10/16/2011  . Zoster 12/06/2013  . Zoster Recombinat (Shingrix) 10/21/2017, 12/18/2017    Diabetes Related Lab Review: Lab Results  Component Value Date   HGBA1C 6.1 (A) 12/09/2019   HGBA1C 6.8 (H) 10/15/2019   HGBA1C 6.4 (A) 07/19/2019    Lab Results  Component Value Date   MICROALBUR <0.7 07/19/2019   Lab Results  Component Value Date   CREATININE 1.07 (H) 10/28/2019   BUN 19 10/28/2019   NA 140 11/01/2019   K 3.7 11/01/2019   CL 101 10/28/2019   CO2 24 10/28/2019   Lab Results  Component Value Date   CHOL 154 07/19/2019   CHOL 235 (H) 03/26/2019   CHOL 148 12/19/2017   Lab Results  Component Value Date   HDL 64.20 07/19/2019   HDL 61.90 03/26/2019   HDL 60.60 12/19/2017   Lab Results  Component Value Date   LDLCALC 61 07/19/2019   LDLCALC 142 (H) 03/26/2019   LDLCALC 65 12/19/2017   Lab Results  Component Value Date   TRIG 142.0 07/19/2019   TRIG 156.0 (H) 03/26/2019   TRIG 111.0 12/19/2017   Lab Results  Component Value Date   CHOLHDL 2 07/19/2019   CHOLHDL 4 03/26/2019   CHOLHDL 2 12/19/2017   Lab Results  Component Value Date   LDLDIRECT 146.7 05/29/2006   The 10-year ASCVD risk score Mikey Bussing DC Jr., et al., 2013) is: 50.5%    Values used to calculate the score:     Age: 84 years     Sex: Female     Is Non-Hispanic African American: No     Diabetic: Yes     Tobacco smoker: No     Systolic Blood Pressure: 478 mmHg     Is BP treated: Yes     HDL Cholesterol: 64.2 mg/dL     Total Cholesterol: 154 mg/dL I have reviewed the PMH, Fam and Soc history. Patient Active Problem List   Diagnosis Date Noted  . Interstitial lung disease ([place]) 11/01/2019    Priority: High  . HFrEF (heart failure with reduced ejection fraction) ([place]) 11/01/2019    Priority: High  . Controlled type 2 diabetes mellitus without complication, without long-term current use of insulin ([place]) 07/19/2019    Priority: High  . Tubular adenoma of colon 03/26/2019    Priority: High    Colonoscopy repeat in 3 years   . Panlobular emphysema ([place]) 06/17/2014    Priority:  High    Overview:  Long-term former smoker, chest CT March 2016 shows early fibrotic changes and apices and emphysematous changes.  Inhalers started   . Major depression, chronic 01/28/2014    Priority: High  . Obesity (BMI 30.0-34.9) 07/15/2013    Priority: High  . Acquired hypothyroidism 04/11/2007    Priority: High  . Mixed hyperlipidemia 04/11/2007    Priority: High  . Essential hypertension 04/11/2007    Priority: High  . Recurrent UTI 04/12/2019    Priority: Medium    Daily prophylaxis started with [facility] January 2021   . Hepatic steatosis 03/26/2019    Priority: Medium  . Osteopenia of femoral neck 05/24/2016    Priority: Medium    Dexa 05/2019 T = - 1.7 femur. Stable. Recheck 2-3 years. Dexa 05/2016 T = -1.4; stable. No significant decrease. Recheck in 2-3 years.   Marland Kitchen RENAL CALCULUS, HX OF 04/11/2007    Priority: Medium  . ACE-inhibitor cough 10/02/2017    Priority: Low  . Postmenopausal symptoms 02/14/2017    Priority: Low    Uses clonidine prn at night to help her sleep IF needed for hot flushes or poor sleep. Started by GYN years ago. occasional use.   .  Grade II hemorrhoids 05/03/2016    Priority: Low  . Snoring 05/06/2014    Priority: Low    Overview:  Negative sleep study 2013   . Allergic rhinitis 12/18/2009    Priority: Low    Overview:  10/1 IMO update   . Rosacea 04/11/2007    Priority: Low    Qualifier: Diagnosis of  By: Hulan Saas, CMA (AAMA), Shannon S    . URINARY INCONTINENCE 04/11/2007    Priority: Low    Qualifier: Diagnosis of  By: Regis Bill MD, Standley Brooking    . Chronic vertigo 07/19/2019  . Hyperglycemia 04/10/2019    Social History: Patient  reports that she quit smoking about 6 years ago. Her smoking use included cigarettes. She has a 7.50 pack-year smoking history. She has never used smokeless tobacco. She reports current alcohol use of about 1.0 standard drink of alcohol per week. She reports that she does not use drugs.  Review of Systems: Ophthalmic: negative for eye pain, loss of vision or double vision Cardiovascular: negative for chest pain Respiratory: negative for SOB or persistent cough Gastrointestinal: negative for abdominal pain Genitourinary: negative for dysuria or gross hematuria MSK: negative for foot lesions Neurologic: negative for weakness or gait disturbance  Objective  Vitals: BP (!) 160/78   Pulse 71   Temp 98.6 F (37 C) (Temporal)   Resp 16   Ht _0  (1.702 m)   Wt 201 lb 3.2 oz (91.3 kg)   SpO2 97%   BMI 31.51 kg/m  General: well appearing, no acute distress  Psych:  Alert and oriented, normal mood and affect HEENT:  Normocephalic, atraumatic, moist mucous membranes, supple neck  Cardiovascular:  Nl S1 and S2, RRR without murmur, gallop or rub. no edema Respiratory: Distant breath sounds bilaterally, CTAB with normal effort, no rales   Diabetic education: ongoing education regarding chronic disease management for diabetes was given today. We continue to reinforce the ABC's of diabetic management: A1c (<7 or 8 dependent upon patient), tight blood pressure control, and  cholesterol management with goal LDL < 100 minimally. We discuss diet strategies, exercise recommendations, medication options and possible side effects. At each visit, we review recommended immunizations and preventive care recommendations for diabetics and stress that good diabetic control can  prevent other problems. See below for this patient's data.    Commons side effects, risks, benefits, and alternatives for medications and treatment plan prescribed today were discussed, and the patient expressed understanding of the given instructions. Patient is instructed to call or message via MyChart if he/she has any questions or concerns regarding our treatment plan. No barriers to understanding were identified. We discussed Red Flag symptoms and signs in detail. Patient expressed understanding regarding what to do in case of urgent or emergency type symptoms.   Medication list was reconciled, printed and provided to the patient in AVS. Patient instructions and summary information was reviewed with the patient as documented in the AVS. This note was prepared with assistance of Dragon voice recognition software. Occasional wrong-word or sound-a-like substitutions may have occurred due to the inherent limitations of voice recognition software  This visit occurred during the SARS-CoV-2 public health emergency.  Safety protocols were in place, including screening questions prior to the visit, additional usage of staff PPE, and extensive cleaning of exam room while observing appropriate contact time as indicated for disinfecting solutions.

## 2019-12-09 NOTE — Telephone Encounter (Signed)
Hi Susan Hunt  She Susan Hunt is coming back today at 2.45pm to seee you for SLB eval. Her cardiac status is now optimized I believe. Followup CT still shows HRCT. If no contraindications for bx per cards -> then before the SLB pls also do BAL for cell count, cultures.   I am off today but available on my cell if needed thought intermittently   Thanks  MR

## 2019-12-09 NOTE — H&P (View-Only) (Signed)
[address]       ,[place] [address]             [phone]     HPI: Susan Hunt returns to discuss possible lung biopsy.  Susan Hunt is a 74 year old woman with history of hypertension, hyperlipidemia, asthma, obesity, hypothyroidism, depression, nonischemic cardiomyopathy, interstitial lung disease.  She has been having shortness of breath dating back about a year.  She had been started on nitrofurantoin for recurrent UTI.  Stopping that medication did not improve her symptoms.  Initially she thought it might be COPD but treatment for that was not helpful.  She was referred to Dr. Chase Caller.  CT of the chest showed interstitial lung disease with some pulmonary fibrosis.  The findings were nonspecific.  I saw her in July and we were considering lung biopsy at that time.  She was found to have a reduced ejection fraction on echocardiogram.  She was referred to Dr. Margaretann Loveless.  Cardiac catheterization showed no significant CAD.  Her EF was about 30 to 35%.  She was started on Entresto and has had significant improvement in her symptoms since then.  She recently had a follow-up CT which showed persistent interstitial changes.  Zubrod Score: At the time of surgery this patient's most appropriate activity status/level should be described as: _0     0    Normal activity, no symptoms _1     1    Restricted in physical strenuous activity but ambulatory, able to do out light work _2     2    Ambulatory and capable of self care, unable to do work activities, up and about >50 % of waking hours                              _3     3    Only limited self care, in bed greater than 50% of waking hours _4     4    Completely disabled, no self care, confined to bed or chair _5     5    Moribund  Current Outpatient Medications  Medication Sig Dispense Refill  . dapagliflozin propanediol (FARXIGA) 5 MG TABS tablet Take 2 tablets (10 mg total) by mouth daily before breakfast. 60 tablet  11  . fluticasone (FLONASE) 50 MCG/ACT nasal spray Place 2 sprays into both nostrils at bedtime.     Marland Kitchen levothyroxine (SYNTHROID) 137 MCG tablet TAKE ONE TABLET BY MOUTH EVERY MORNING BEFORE BREAKFAST (Patient taking differently: Take 137 mcg by mouth daily before breakfast. ) 90 tablet 2  . metoprolol succinate (TOPROL XL) 25 MG 24 hr tablet Take 1 tablet (25 mg total) by mouth daily. 30 tablet 3  . montelukast (SINGULAIR) 10 MG tablet TAKE ONE TABLET BY MOUTH EVERY NIGHT AT BEDTIME (Patient taking differently: Take 10 mg by mouth at bedtime. ) 90 tablet 1  . Multiple Vitamin (MULTIVITAMIN WITH MINERALS) TABS tablet Take 1 tablet by mouth daily.    Marland Kitchen omeprazole (PRILOSEC) 20 MG capsule TAKE ONE CAPSULE BY MOUTH DAILY (Patient taking differently: Take 20 mg by mouth daily. ) 90 capsule 0  . rosuvastatin (CRESTOR) 10 MG tablet Take 1 tablet (10 mg total) by mouth daily. 90 tablet 3  . sacubitril-valsartan (ENTRESTO) 49-51 MG Take 1 tablet by mouth 2 (two) times daily. 60 tablet 11  . venlafaxine XR (EFFEXOR-XR) 150 MG 24 hr capsule TAKE ONE CAPSULE BY MOUTH DAILY 15  capsule 0  . cephALEXin (KEFLEX) 250 MG capsule Take 250 mg by mouth daily. (Patient not taking: Reported on 12/09/2019)     Current Facility-Administered Medications  Medication Dose Route Frequency Provider Last Rate Last Admin  . sodium chloride flush (NS) 0.9 % injection 3 mL  3 mL Intravenous Q12H Elouise Munroe, MD        Physical Exam BP (!) 170/84   Pulse 70   Resp 20   Ht _0  (1.702 m)   Wt 200 lb (90.7 kg)   SpO2 95% Comment: RA  BMI 31.73 kg/m  74 year old woman in no acute distress Alert and oriented x3 with no focal deficits Obese No cervical or supraclavicular adenopathy Lungs faint crackles at bases Cardiac regular rate and rhythm No peripheral edema  Diagnostic Tests: CT CHEST WITHOUT CONTRAST  TECHNIQUE: Multidetector CT imaging of the chest was performed following the standard protocol without  intravenous contrast. High resolution imaging of the lungs, as well as inspiratory and expiratory imaging, was performed.  COMPARISON:  09/04/2019  FINDINGS: Cardiovascular: Aortic atherosclerosis. Cardiomegaly. Three-vessel coronary artery calcifications. No pericardial effusion.  Mediastinum/Nodes: Unchanged prominent mediastinal lymph nodes, again likely reactive. Small hiatal hernia. Thyroid gland, trachea, and esophagus demonstrate no significant findings.  Lungs/Pleura: No significant interval change in a pattern of mild pulmonary fibrosis, with a slight apical predominance, primarily featuring irregular peripheral interstitial opacity and mild, tubular bronchiectasis, with some scattered areas of subpleural bronchiolectasis. No evidence of honeycombing. Mild lobular air trapping on expiratory phase imaging, more evident than on prior examination, likely due to improved expiratory effort. No pleural effusion or pneumothorax.  Upper Abdomen: No acute abnormality.  Musculoskeletal: No chest wall mass or suspicious bone lesions identified.  IMPRESSION: 1. No significant interval change in a pattern of mild pulmonary fibrosis, with a slight apical predominance, primarily featuring irregular peripheral interstitial opacity and mild, tubular bronchiectasis, with some scattered areas of subpleural bronchiolectasis. No evidence of honeycombing. Mild lobular air trapping on expiratory phase imaging, more evident than on prior examination, likely due to improved expiratory effort. Findings are consistent with an "alternative diagnosis" pattern of fibrosis by ATS pulmonary fibrosis criteria, findings suggesting chronic fibrosis hypersensitivity pneumonitis given the presence of air trapping or NSIP. 2. Cardiomegaly and coronary artery disease. 3. Aortic Atherosclerosis (ICD10-I70.0).   Electronically Signed   By: Eddie Candle M.D.   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  She has been having trouble shortness of breath for about a year now.  She has been on and off beta-blocker and ACE inhibitor previously.  Her symptoms currently are improved with Entresto and Toprol-XL.  A recent CT showed persistent interstitial changes and mild pulmonary fibrosis.  Dr.Ramaswamy requested lung biopsy for diagnostic purposes to assist with management of her ILD.  I recommended to Mrs. Giacobbe that we proceed with bronchoscopy for BAL for cell count and culture and right robotic VATS for lung biopsy and lymph node biopsy.  She understands this is diagnostic in nature and not therapeutic.  I informed her of the general nature of the procedure including the incisions to be used, the use of a drainage tube postoperatively, the expected hospital stay, and the overall recovery.  I informed her and her husband of the indications, risks, benefits, and alternatives.  They understand the risks include, but not limited to death, MI, DVT, PE, bleeding, possible need for transfusion, infection, air leak, irregular heart rhythms, as well as possibility of other unforeseeable complications.  She accepts the risks and wishes to proceed.   Plan: Bronchoscopy for BAL for cell count and culture, right robotic VATS for lung biopsy and lymph node biopsy on Friday, 12/13/2019.   Melrose Nakayama, MD [facility] [phone]

## 2019-12-10 ENCOUNTER — Other Ambulatory Visit: Payer: Self-pay

## 2019-12-10 ENCOUNTER — Ambulatory Visit (INDEPENDENT_AMBULATORY_CARE_PROVIDER_SITE_OTHER): Payer: Medicare Other | Admitting: Pharmacist

## 2019-12-10 VITALS — BP 145/75 | HR 72

## 2019-12-10 DIAGNOSIS — I5020 Unspecified systolic (congestive) heart failure: Secondary | ICD-10-CM

## 2019-12-10 LAB — CBC WITH DIFFERENTIAL/PLATELET
Absolute Monocytes: 762 cells/uL (ref 200–950)
Basophils Absolute: 82 cells/uL (ref 0–200)
Basophils Relative: 0.8 %
Eosinophils Absolute: 299 cells/uL (ref 15–500)
Eosinophils Relative: 2.9 %
HCT: 41.9 % (ref 35.0–45.0)
Hemoglobin: 13.2 g/dL (ref 11.7–15.5)
Lymphs Abs: 3533 cells/uL (ref 850–3900)
MCH: 27.4 pg (ref 27.0–33.0)
MCHC: 31.5 g/dL — ABNORMAL LOW (ref 32.0–36.0)
MCV: 86.9 fL (ref 80.0–100.0)
MPV: 11.1 fL (ref 7.5–12.5)
Monocytes Relative: 7.4 %
Neutro Abs: 5624 cells/uL (ref 1500–7800)
Neutrophils Relative %: 54.6 %
Platelets: 288 10*3/uL (ref 140–400)
RBC: 4.82 10*6/uL (ref 3.80–5.10)
RDW: 14.5 % (ref 11.0–15.0)
Total Lymphocyte: 34.3 %
WBC: 10.3 10*3/uL (ref 3.8–10.8)

## 2019-12-10 LAB — TSH: TSH: 0.31 mIU/L — ABNORMAL LOW (ref 0.40–4.50)

## 2019-12-10 LAB — COMPLETE METABOLIC PANEL WITH GFR
AG Ratio: 1.5 (calc) (ref 1.0–2.5)
ALT: 18 U/L (ref 6–29)
AST: 32 U/L (ref 10–35)
Albumin: 4.1 g/dL (ref 3.6–5.1)
Alkaline phosphatase (APISO): 82 U/L (ref 37–153)
BUN/Creatinine Ratio: 11 (calc) (ref 6–22)
BUN: 11 mg/dL (ref 7–25)
CO2: 28 mmol/L (ref 20–32)
Calcium: 9.2 mg/dL (ref 8.6–10.4)
Chloride: 105 mmol/L (ref 98–110)
Creat: 1.04 mg/dL — ABNORMAL HIGH (ref 0.60–0.93)
GFR, Est African American: 61 mL/min/{1.73_m2} (ref >=60)
GFR, Est Non African American: 53 mL/min/{1.73_m2} — ABNORMAL LOW (ref >=60)
Globulin: 2.8 g/dL (calc) (ref 1.9–3.7)
Glucose, Bld: 87 mg/dL (ref 65–99)
Potassium: 3.5 mmol/L (ref 3.5–5.3)
Sodium: 141 mmol/L (ref 135–146)
Total Bilirubin: 0.6 mg/dL (ref 0.2–1.2)
Total Protein: 6.9 g/dL (ref 6.1–8.1)

## 2019-12-10 LAB — LIPID PANEL
Cholesterol: 155 mg/dL (ref <200)
HDL: 65 mg/dL (ref >=50)
LDL Cholesterol (Calc): 73 mg/dL (calc)
Non-HDL Cholesterol (Calc): 90 mg/dL (calc) (ref <130)
Total CHOL/HDL Ratio: 2.4 (calc) (ref <5.0)
Triglycerides: 87 mg/dL (ref <150)

## 2019-12-10 MED ORDER — LEVOTHYROXINE SODIUM 125 MCG PO TABS: 125.0000 ug | ORAL_TABLET | Freq: Every day | ORAL | 0 refills | Status: DC

## 2019-12-10 NOTE — Progress Notes (Signed)
Patient ID: Susan Hunt                 DOB: 1945-11-28                      MRN: [id number]     HPI: Susan Hunt is a 74 y.o. female referred by Dr. Shanda [facility].PMH is significant for HFrEF (LVEF 30-35%), NICM with normal R and L heart pressures, HTN, HLD, and interstitial lung disease with pulmonary fibrosis.  At last visit on 11/25/19 in HF clinic, BP controlled however HR was low at 55 bpm after starting Toprol XL 25 mg daily. Patient was started on Entresto 49/51 mg BID and losartan, triamterene-HCTZ, and amlodipine were discontinued. Toprol XL and Wilder Glade were continued.   Patient presents today in good spirits for 2-week HF follow-up, accompanied by her husband. Patient reports that her breathing has improved ever since starting Entresto 49/51 mg two weeks ago. However, she has also been experiencing dry facial skin, itchiness, dry eyes, and runny nose, which she believes was caused by starting Entresto. Patient has history of seasonal allergies, for which she takes Zyrtec, Singulair, and fluticasone, but patient states she has not had symptoms this bad before. Patient reports these side effects were affecting her ability to sleep through the night and had to stop taking Entresto three days ago (evening dose on Saturday 9/25). Reports symptoms have improved since stopping Entresto.  Patient also reports improvement in her activity level and fatigue. She reports she has been able to walk more since the last visit. She would previously get SOB walking from living room to kitchen, but now is able to go to the grocery store. Patient continues to have some baseline SOB, but she also has interstitial lung disease, which may contribute to her difficulty breathing. Patient denies any swelling, weight changes, chest pain, or palpitations.  Patient brought home blood pressure cuff and readings from the past two weeks. BP has been variable, with some readings as low as 107/57 and  some as high as 156/79 while on Entresto. Patient usually checks BP in afternoon or evening and reports resting for at least 5 minutes before checking. After stopping Entresto, patient's blood pressure from past few days have been more elevated at 144/59, 177/89, and 160/78. Office reading today was 145/75 on office cuff and 148/79 on patient's home cuff. Heart rate was 72.  Current CHF/HTN meds:   Entresto 49/51 mg BID  Metoprolol succinate 25 mg daily  Dapagliflozin 10 mg daily  Previously tried: lisinopril-hctz (SOB, coughing), metoprolol tartrate (SOB, coughing), clonidine, triamterene-HCTZ, losartan, amlodipine   BP goal: <130/80 mmHg  Family History:none  Social History:2-3 mixed drinks weekly, former smoker (quit 10 years ago)  Diet:Does not add salt to food or cook with salt. Enjoys eating tomato-based foods. Does not eat breakfast. Eats lunch/dinner at 3-4 pm daily. At an egg salad sandwich yesterday. Eats out at restaurants often ([place]. [place]) but avoids fast food.  Exercise:limited due to dyspnea -walksdog (halfway around block)  Home BP readings: On Entresto: 150/70, 118/63, 141/73, 148/64, 136/70, 133/73, 156/79, 139/105, 107/57 (no dizziness)  After d/c Entresto: 144/59, 177/84 (felt normal)  HR has been in 70s-80s      Wt Readings from Last 3 Encounters:  11/12/19 200 lb 1.6 oz (90.8 kg)  11/11/19 199 lb (90.3 kg)  11/01/19 200 lb (90.7 kg)      BP Readings from Last 3 Encounters:  11/25/19 128/70  11/12/19  122/72  11/11/19 126/74      Pulse Readings from Last 3 Encounters:  11/25/19 (!) 55  11/12/19 72  11/11/19 91    Renal function: CrCl cannot be calculated (Patient's most recent lab result is older than the maximum 21 days allowed.).      Past Medical History:  Diagnosis Date  . Asthma    "I dont think I have it.  . Depression   . Emphysema of lung ([place])   . GERD (gastroesophageal reflux disease)   .  Hemorrhoids   . History of kidney stones    Cystoscopy  . Hyperlipidemia   . Hypertension   . Hypothyroidism   . Obesity   . Rosacea   . Tubular adenoma of colon 03/26/2019   Colonoscopy repeat in 3 years          Current Outpatient Medications on File Prior to Visit  Medication Sig Dispense Refill  . cephALEXin (KEFLEX) 250 MG capsule Take 250 mg by mouth daily.    . dapagliflozin propanediol (FARXIGA) 5 MG TABS tablet Take 2 tablets (10 mg total) by mouth daily before breakfast. 60 tablet 11  . fluticasone (FLONASE) 50 MCG/ACT nasal spray Place 2 sprays into both nostrils at bedtime.     Marland Kitchen levothyroxine (SYNTHROID) 137 MCG tablet TAKE ONE TABLET BY MOUTH EVERY MORNING BEFORE BREAKFAST (Patient taking differently: Take 137 mcg by mouth daily before breakfast. ) 90 tablet 2  . metoprolol succinate (TOPROL XL) 25 MG 24 hr tablet Take 1 tablet (25 mg total) by mouth daily. 30 tablet 3  . montelukast (SINGULAIR) 10 MG tablet TAKE ONE TABLET BY MOUTH EVERY NIGHT AT BEDTIME (Patient taking differently: Take 10 mg by mouth at bedtime. ) 90 tablet 1  . Multiple Vitamin (MULTIVITAMIN WITH MINERALS) TABS tablet Take 1 tablet by mouth daily.    Marland Kitchen omeprazole (PRILOSEC) 20 MG capsule TAKE ONE CAPSULE BY MOUTH DAILY (Patient taking differently: Take 20 mg by mouth daily. ) 90 capsule 0  . rosuvastatin (CRESTOR) 10 MG tablet Take 1 tablet (10 mg total) by mouth daily. 90 tablet 3  . sacubitril-valsartan (ENTRESTO) 49-51 MG Take 1 tablet by mouth 2 (two) times daily. 60 tablet 11  . venlafaxine XR (EFFEXOR-XR) 150 MG 24 hr capsule TAKE ONE CAPSULE BY MOUTH DAILY 15 capsule 0            Current Facility-Administered Medications on File Prior to Visit  Medication Dose Route Frequency Provider Last Rate Last Admin  . sodium chloride flush (NS) 0.9 % injection 3 mL  3 mL Intravenous Q12H Elouise Munroe, MD             Allergies  Allergen Reactions  . Beta Adrenergic Blockers  Shortness Of Breath    Avoid due to COPD  . Ace Inhibitors Cough  . Clarithromycin     REACTION: GI sx  . Codeine Nausea Only  . Telmisartan Other (See Comments)    sleepy     Assessment/Plan:  1. CHF - Office BP today is above goal of <130/80. Patient's BP has worsened over past few days after stopping Entresto. BMET drawn yesterday was stable. Given its clinical benefits in heart failure, we will re-start Entresto 49/51 mg BID and see if patient is truly unable to tolerate or if her symptoms may have been caused by other factors, such as seasonal allergies or changing weather. Patient is willing to try this. Instructed patient to call the pharmacy clinic if she starts  to experience similar itchiness or dryness after re-starting Entresto. If this occurs, we will discontinue Entresto and re-initiate another ARB such as candesartan. Continue metoprolol succinate 25 mg daily and dapagliflozin 10 mg daily. Counseled patient to continue checking BP daily at home and reviewed proper technique. Also counseled patient to increase her physical activity as able, such as taking daily walks. Plan to follow up with patient in clinic in 2 weeks. Can consider further titrating Entresto or beta blocker or adding spironolactone at that time.    Esmeralda Links (PharmD Candidate 2022)  Lorel Monaco, PharmD PGY2 [place] [address], [place], [place] [address] Phone: [phone]; Fax: [phone]

## 2019-12-10 NOTE — Patient Instructions (Addendum)
Great to see you today!  Your blood pressure today was 145/75. Your goal is <130/80.  We would like to RE-START Entresto 49/51 mg twice daily. This will help lower your blood pressure and improve your heart symptoms. Please call us if you start to experience intolerable side effects again so that we can switch it out with another blood pressure medications.  Continue to check your daily blood pressure and heart rate readings at home. For accurate readings, sit up straight with your feet flat on the floor and arm relaxed at around heart level. Take your blood pressure after resting for about 5 minutes.  Increase your daily activity as able, such as taking walks for 10-20 minutes every day after dinner.  We will see you back in clinic in 2 weeks.   Please call us if you have any questions or concerns: [phone]

## 2019-12-11 ENCOUNTER — Encounter (HOSPITAL_COMMUNITY): Payer: Self-pay

## 2019-12-11 ENCOUNTER — Other Ambulatory Visit: Payer: Self-pay

## 2019-12-11 ENCOUNTER — Ambulatory Visit (HOSPITAL_COMMUNITY)
Admission: RE | Admit: 2019-12-11 | Discharge: 2019-12-11 | Disposition: A | Discharge status: Home / Self Care | Payer: Medicare Other | Source: Ambulatory Visit | Attending: Thoracic Surgery (Cardiothoracic Vascular Surgery) | Admitting: Thoracic Surgery (Cardiothoracic Vascular Surgery)

## 2019-12-11 ENCOUNTER — Encounter (HOSPITAL_COMMUNITY)
Admission: RE | Admit: 2019-12-11 | Discharge: 2019-12-11 | Disposition: A | Discharge status: Home / Self Care | Payer: Medicare Other | Source: Ambulatory Visit | Attending: Thoracic Surgery (Cardiothoracic Vascular Surgery) | Admitting: Thoracic Surgery (Cardiothoracic Vascular Surgery)

## 2019-12-11 ENCOUNTER — Other Ambulatory Visit (HOSPITAL_COMMUNITY)
Admission: RE | Admit: 2019-12-11 | Discharge: 2019-12-11 | Disposition: A | Discharge status: Home / Self Care | Payer: Medicare Other | Source: Ambulatory Visit | Attending: Thoracic Surgery (Cardiothoracic Vascular Surgery) | Admitting: Thoracic Surgery (Cardiothoracic Vascular Surgery)

## 2019-12-11 DIAGNOSIS — Z4682 Encounter for fitting and adjustment of non-vascular catheter: Secondary | ICD-10-CM | POA: Diagnosis not present

## 2019-12-11 DIAGNOSIS — I517 Cardiomegaly: Secondary | ICD-10-CM | POA: Diagnosis not present

## 2019-12-11 DIAGNOSIS — J984 Other disorders of lung: Secondary | ICD-10-CM | POA: Diagnosis not present

## 2019-12-11 DIAGNOSIS — J449 Chronic obstructive pulmonary disease, unspecified: Secondary | ICD-10-CM | POA: Diagnosis not present

## 2019-12-11 DIAGNOSIS — I509 Heart failure, unspecified: Secondary | ICD-10-CM | POA: Diagnosis not present

## 2019-12-11 DIAGNOSIS — E669 Obesity, unspecified: Secondary | ICD-10-CM | POA: Diagnosis present

## 2019-12-11 DIAGNOSIS — Z79899 Other long term (current) drug therapy: Secondary | ICD-10-CM | POA: Insufficient documentation

## 2019-12-11 DIAGNOSIS — I10 Essential (primary) hypertension: Secondary | ICD-10-CM | POA: Diagnosis not present

## 2019-12-11 DIAGNOSIS — Z6831 Body mass index (BMI) 31.0-31.9, adult: Secondary | ICD-10-CM | POA: Diagnosis not present

## 2019-12-11 DIAGNOSIS — Z87891 Personal history of nicotine dependence: Secondary | ICD-10-CM | POA: Insufficient documentation

## 2019-12-11 DIAGNOSIS — J8489 Other specified interstitial pulmonary diseases: Secondary | ICD-10-CM | POA: Diagnosis not present

## 2019-12-11 DIAGNOSIS — Z79890 Hormone replacement therapy (postmenopausal): Secondary | ICD-10-CM | POA: Diagnosis not present

## 2019-12-11 DIAGNOSIS — J849 Interstitial pulmonary disease, unspecified: Secondary | ICD-10-CM | POA: Insufficient documentation

## 2019-12-11 DIAGNOSIS — I493 Ventricular premature depolarization: Secondary | ICD-10-CM | POA: Diagnosis not present

## 2019-12-11 DIAGNOSIS — E119 Type 2 diabetes mellitus without complications: Secondary | ICD-10-CM | POA: Diagnosis not present

## 2019-12-11 DIAGNOSIS — Z20822 Contact with and (suspected) exposure to covid-19: Secondary | ICD-10-CM | POA: Insufficient documentation

## 2019-12-11 DIAGNOSIS — I428 Other cardiomyopathies: Secondary | ICD-10-CM | POA: Insufficient documentation

## 2019-12-11 DIAGNOSIS — J9811 Atelectasis: Secondary | ICD-10-CM | POA: Diagnosis not present

## 2019-12-11 DIAGNOSIS — J45909 Unspecified asthma, uncomplicated: Secondary | ICD-10-CM | POA: Diagnosis not present

## 2019-12-11 DIAGNOSIS — E039 Hypothyroidism, unspecified: Secondary | ICD-10-CM | POA: Insufficient documentation

## 2019-12-11 DIAGNOSIS — J8410 Pulmonary fibrosis, unspecified: Secondary | ICD-10-CM | POA: Diagnosis not present

## 2019-12-11 DIAGNOSIS — E785 Hyperlipidemia, unspecified: Secondary | ICD-10-CM | POA: Insufficient documentation

## 2019-12-11 DIAGNOSIS — R918 Other nonspecific abnormal finding of lung field: Secondary | ICD-10-CM | POA: Insufficient documentation

## 2019-12-11 DIAGNOSIS — I700 Atherosclerosis of aorta: Secondary | ICD-10-CM | POA: Diagnosis not present

## 2019-12-11 DIAGNOSIS — I110 Hypertensive heart disease with heart failure: Secondary | ICD-10-CM | POA: Diagnosis not present

## 2019-12-11 DIAGNOSIS — R0602 Shortness of breath: Secondary | ICD-10-CM | POA: Diagnosis not present

## 2019-12-11 DIAGNOSIS — Z01818 Encounter for other preprocedural examination: Secondary | ICD-10-CM | POA: Insufficient documentation

## 2019-12-11 HISTORY — DX: Prediabetes: R73.03

## 2019-12-11 HISTORY — DX: Anemia, unspecified: D64.9

## 2019-12-11 HISTORY — DX: Heart failure, unspecified: I50.9

## 2019-12-11 LAB — CBC
HCT: 42.9 % (ref 36.0–46.0)
Hemoglobin: 13 g/dL (ref 12.0–15.0)
MCH: 26.9 pg (ref 26.0–34.0)
MCHC: 30.3 g/dL (ref 30.0–36.0)
MCV: 88.8 fL (ref 80.0–100.0)
Platelets: 306 10*3/uL (ref 150–400)
RBC: 4.83 MIL/uL (ref 3.87–5.11)
RDW: 15.1 % (ref 11.5–15.5)
WBC: 8.8 10*3/uL (ref 4.0–10.5)
nRBC: 0 % (ref 0.0–0.2)

## 2019-12-11 LAB — URINALYSIS, ROUTINE W REFLEX MICROSCOPIC
Bacteria, UA: NONE SEEN
Bilirubin Urine: NEGATIVE
Glucose, UA: >=500 mg/dL — AB
Hgb urine dipstick: NEGATIVE
Ketones, ur: NEGATIVE mg/dL
Leukocytes,Ua: NEGATIVE
Nitrite: NEGATIVE
Protein, ur: NEGATIVE mg/dL
Specific Gravity, Urine: 1.016 (ref 1.005–1.030)
pH: 6 (ref 5.0–8.0)

## 2019-12-11 LAB — COMPREHENSIVE METABOLIC PANEL
ALT: 21 U/L (ref 0–44)
AST: 28 U/L (ref 15–41)
Albumin: 3.7 g/dL (ref 3.5–5.0)
Alkaline Phosphatase: 80 U/L (ref 38–126)
Anion gap: 13 (ref 5–15)
BUN: 11 mg/dL (ref 8–23)
CO2: 20 mmol/L — ABNORMAL LOW (ref 22–32)
Calcium: 9.1 mg/dL (ref 8.9–10.3)
Chloride: 107 mmol/L (ref 98–111)
Creatinine, Ser: 1.08 mg/dL — ABNORMAL HIGH (ref 0.44–1.00)
GFR calc Af Amer: 58 mL/min — ABNORMAL LOW (ref >60)
GFR calc non Af Amer: 50 mL/min — ABNORMAL LOW (ref >60)
Glucose, Bld: 137 mg/dL — ABNORMAL HIGH (ref 70–99)
Potassium: 3.8 mmol/L (ref 3.5–5.1)
Sodium: 140 mmol/L (ref 135–145)
Total Bilirubin: 0.8 mg/dL (ref 0.3–1.2)
Total Protein: 7.1 g/dL (ref 6.5–8.1)

## 2019-12-11 LAB — BLOOD GAS, ARTERIAL
Acid-base deficit: 0.1 mmol/L (ref 0.0–2.0)
Bicarbonate: 23.8 mmol/L (ref 20.0–28.0)
Drawn by: 58793
FIO2: 21
O2 Saturation: 96.7 %
Patient temperature: 37
pCO2 arterial: 37.7 mmHg (ref 32.0–48.0)
pH, Arterial: 7.418 (ref 7.350–7.450)
pO2, Arterial: 85.5 mmHg (ref 83.0–108.0)

## 2019-12-11 LAB — SURGICAL PCR SCREEN
MRSA, PCR: NEGATIVE
Staphylococcus aureus: NEGATIVE

## 2019-12-11 LAB — TYPE AND SCREEN
ABO/RH(D): O POS
Antibody Screen: NEGATIVE

## 2019-12-11 LAB — GLUCOSE, CAPILLARY: Glucose-Capillary: 127 mg/dL — ABNORMAL HIGH (ref 70–99)

## 2019-12-11 LAB — PROTIME-INR
INR: 1.1 (ref 0.8–1.2)
Prothrombin Time: 13.6 seconds (ref 11.4–15.2)

## 2019-12-11 LAB — APTT: aPTT: 33 seconds (ref 24–36)

## 2019-12-11 LAB — SARS CORONAVIRUS 2 (TAT 6-24 HRS): SARS Coronavirus 2: NEGATIVE

## 2019-12-11 NOTE — Progress Notes (Addendum)
PCP - Rosendo Gros L. Jonni Sanger, MD Cardiologist - Cherlynn Kaiser, MD  PPM/ICD - Denies  Chest x-ray - 12/11/19 EKG - 12/11/19 Stress Test - 05/15/14 ECHO - 09/13/19 Cardiac Cath - 11/01/19  Sleep Study - Yes, negative for OSA CPAP - N/A  Patient is pre-diabetic. Last A1C 6.7 on 12/09/19. CBG at PAT appointment was 127  Blood Thinner Instructions: N/A Aspirin Instructions: N/A  ERAS Protcol: N/A PRE-SURGERY Ensure or G2- N/A  COVID TEST- 12/11/19   Anesthesia review: Yes, cardiac hx.   Patient denies shortness of breath, fever, cough and chest pain at PAT appointment   All instructions explained to the patient, with a verbal understanding of the material. Patient agrees to go over the instructions while at home for a better understanding. Patient also instructed to self quarantine after being tested for COVID-19. The opportunity to ask questions was provided.

## 2019-12-11 NOTE — Pre-Procedure Instructions (Signed)
Your procedure is scheduled on Friday, October 1, from 07:30 AM- 1:34 PM.  Report to [facility] Main Entrance "A" at 05:30 A.M., and check in at the Admitting office.  Call this number if you have problems the morning of surgery:  [phone]  Call [phone] if you have any questions prior to your surgery date Monday-Friday 8am-4pm.    Remember:  Do not eat or drink after midnight the night before your surgery.    Take these medicines the morning of surgery with A SIP OF WATER: cetirizine (ZYRTEC) levothyroxine (SYNTHROID) metoprolol succinate (TOPROL XL) omeprazole (PRILOSEC) rosuvastatin (CRESTOR)  venlafaxine XR (EFFEXOR-XR)  IF NEEDED: fluticasone (FLONASE) nasal spray  As of today, STOP taking any Aspirin (unless otherwise instructed by your surgeon) Aleve, Naproxen, Ibuprofen, Motrin, Advil, Goody's, BC's, all herbal medications, fish oil, and all vitamins.          The Morning of Surgery:            Do not wear jewelry, make up, or nail polish.            Do not wear lotions, powders, perfumes, or deodorant.            Do not shave 48 hours prior to surgery.             Do not bring valuables to the hospital.            [facility] is not responsible for any belongings or valuables.  Do NOT Smoke (Tobacco/Vaping) or drink Alcohol 24 hours prior to your procedure.  If you use a CPAP at night, you may bring all equipment for your overnight stay.   Contacts, glasses, dentures or bridgework may not be worn into surgery.      For patients admitted to the hospital, discharge time will be determined by your treatment team.   Patients discharged the day of surgery will not be allowed to drive home, and someone needs to stay with them for 24 hours.    Special instructions:   [place]- Preparing For Surgery  Before surgery, you can play an important role. Because skin is not sterile, your skin needs to be as free of germs as possible. You can reduce the number of  germs on your skin by washing with CHG (chlorahexidine gluconate) Soap before surgery.  CHG is an antiseptic cleaner which kills germs and bonds with the skin to continue killing germs even after washing.    Oral Hygiene is also important to reduce your risk of infection.  Remember - BRUSH YOUR TEETH THE MORNING OF SURGERY WITH YOUR REGULAR TOOTHPASTE  Please do not use if you have an allergy to CHG or antibacterial soaps. If your skin becomes reddened/irritated stop using the CHG.  Do not shave (including legs and underarms) for at least 48 hours prior to first CHG shower. It is OK to shave your face.  Please follow these instructions carefully.   1. Shower the NIGHT BEFORE SURGERY and the MORNING OF SURGERY with CHG Soap.   2. If you chose to wash your hair, wash your hair first as usual with your normal shampoo.  3. After you shampoo, rinse your hair and body thoroughly to remove the shampoo.  4. Use CHG as you would any other liquid soap. You can apply CHG directly to the skin and wash gently with a scrungie or a clean washcloth.   5. Apply the CHG Soap to your body ONLY FROM THE NECK  DOWN.  Do not use on open wounds or open sores. Avoid contact with your eyes, ears, mouth and genitals (private parts). Wash Face and genitals (private parts)  with your normal soap.   6. Wash thoroughly, paying special attention to the area where your surgery will be performed.  7. Thoroughly rinse your body with warm water from the neck down.  8. DO NOT shower/wash with your normal soap after using and rinsing off the CHG Soap.  9. Pat yourself dry with a CLEAN TOWEL.  10. Wear CLEAN PAJAMAS to bed the night before surgery  11. Place CLEAN SHEETS on your bed the night of your first shower and DO NOT SLEEP WITH PETS.   Day of Surgery: SHOWER Wear Clean/Comfortable clothing the morning of surgery Do not apply any deodorants/lotions.   Remember to brush your teeth WITH YOUR REGULAR TOOTHPASTE.    Please read over the following fact sheets that you were given.

## 2019-12-11 NOTE — Progress Notes (Signed)
Anesthesia Chart Review:  Case: [id number] Date/Time: 12/13/19 0715   Procedures:      XI ROBOTIC ASSISTED THORASCOPY (Right )     LUNG BIOPSY (Right )     LYMPH NODE BIOPSY (Right )     VIDEO BRONCHOSCOPY (N/A )   Anesthesia type: General   Pre-op diagnosis: ILD   Location: [facility] OR ROOM 10 / [facility]   Surgeons: Melrose Nakayama, MD      DISCUSSION: Patient is a 74 year old female scheduled for the above procedure for further evaluation of progressive SOB with cough with imaging suggestive of ILD with mild pulmonary fibrosis. Surgery was initially scheduled for 10/17/19, but surgery postponed to allow for cardiology evaluation due to new finding of LV dysfunction on recent echo (LVEF 30-35%, global hypokinesis 09/13/19). Since then she has been evaluated by cardiologist Dr. Margaretann Loveless and diagnosed with non-ischemic cardiomyopathy (non-obstructive CAD, EF 35-45% 11/01/19 cath). Symptoms have been improving with medication therapy. Repeat chest CTA showed persistent interstitial changes.   History includes former smoker (quit 08/18/13), non-ischemic cardiomyopathy, HFrEF (09/2019), HTN, HLD, hypothyroidism, emphysema, GERD, refractory bowel leakage (s/p Complete InterStim Sacral Neuromodulation system implantation 07/18/19, [facility]), septoplasty with bilateral inferior turbinate reductions (08/08/06). Mild pulmonary fibrosis on 08/2019 CT, but she is not aware of an asthma diagnosis.  She reported a history of negative sleep study.   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   Zubrod Score 12/09/19 per Dr. Derenda Fennel -Restricted in physical strenuous activity but ambulatory, able to do out light work.  12/11/2019 presurgical COVID-19 test negative.  Anesthesia team to evaluate on the day of surgery.   VS: BP (!) 171/84   Pulse 88   Temp 36.5 C (Oral)   Resp 17   Ht _0  (1.702 m)   Wt 92.3 kg   SpO2 96%   BMI 31.86 kg/m    PROVIDERS: Leamon Arnt, MD is PCP  - Cherlynn Kaiser, MD is cardiologist. Last evaluation 11/11/19. On medical therapy for CHF (also followed at [facility]). Repeat echo in 3-6 months is planned.  Blair Dolphin, MD is pulmonologist - Rockey Situ, MD is uro-gynecologist ([place])   LABS: Labs reviewed: Acceptable for surgery. A1c 6.1% on 12/09/19.   (all labs ordered are listed, but only abnormal results are displayed)  Labs Reviewed  GLUCOSE, CAPILLARY - Abnormal; Notable for the following components:      Result Value   Glucose-Capillary 127 (*)    All other components within normal limits  COMPREHENSIVE METABOLIC PANEL - Abnormal; Notable for the following components:   CO2 20 (*)    Glucose, Bld 137 (*)    Creatinine, Ser 1.08 (*)    GFR calc non Af Amer 50 (*)    GFR calc Af Amer 58 (*)    All other components within normal limits  URINALYSIS, ROUTINE W REFLEX MICROSCOPIC - Abnormal; Notable for the following components:   APPearance HAZY (*)    Glucose, UA >=500 (*)    All other components within normal limits  SURGICAL PCR SCREEN  APTT  BLOOD GAS, ARTERIAL  CBC  PROTIME-INR  TYPE AND SCREEN    PFTs 08/23/19:  FVC 2.61 (80%), post 2.57 (79%) FEV1 2.26 (92%), post 2.36 (96%) DLCO cor 13.76 (64%)   IMAGES: CXR 12/11/19: In process.   CT Chest (high resolution) 11/29/19: IMPRESSION: 1. No significant interval change in a pattern of  mild pulmonary fibrosis, with a slight apical predominance, primarily featuring irregular peripheral interstitial opacity and mild, tubular bronchiectasis, with some scattered areas of subpleural bronchiolectasis. No evidence of honeycombing. Mild lobular air trapping on expiratory phase imaging, more evident than on prior examination, likely due to improved expiratory effort. Findings are consistent with an "alternative diagnosis" pattern of fibrosis by ATS pulmonary fibrosis criteria, findings  suggesting chronic fibrosis hypersensitivity pneumonitis given the presence of air trapping or NSIP. 2. Cardiomegaly and coronary artery disease. 3. Aortic Atherosclerosis (ICD10-I70.0).   EKG: EKG 12/11/19: Normal sinus rhythm Nonspecific ST and T wave abnormality Abnormal ECG Since last tracing PVCs not captured Confirmed by Buford Dresser [id number]) on 12/11/2019 5:33:13 PM  EKG 10/15/19: SR with occasional multi-focal PVCs. Negative T waves in septal leads.   CV: Cardiac cath 11/01/19:  Prox LAD to Mid LAD lesion is 30% stenosed.  Ost Cx to Prox Cx lesion is 35% stenosed.  Prox RCA lesion is 35% stenosed.  There is moderate left ventricular systolic dysfunction.  LV end diastolic pressure is normal.  The left ventricular ejection fraction is 35-45% by visual estimate. 1. Nonobstructive CAD 2. Moderate LV dysfunction. EF estimated at 40% with global hypokinesis 3. Normal LV filling pressures 4. Normal right heart pressures 5. Normal cardiac output.  Plan: medical therapy for LV dysfunction and lipid lowering therapy for nonobstructive CAD.   Echo 09/13/19: IMPRESSIONS  1. Left ventricular ejection fraction, by estimation, is 30 to 35%. The  left ventricle has moderately decreased function. The left ventricle  demonstrates global hypokinesis. Apex poorly visualized, consider repeat  limited echo with contrast to rule out  apical thrombus. The left ventricular internal cavity size was moderately  dilated. Left ventricular diastolic parameters are consistent with Grade I  diastolic dysfunction (impaired relaxation).  2. Right ventricular systolic function is normal. The right ventricular  size is normal. Tricuspid regurgitation signal is inadequate for assessing  PA pressure.  3. The mitral valve is normal in structure. Trivial mitral valve  regurgitation.  4. The aortic valve was not well visualized. Aortic valve regurgitation  is not visualized. No aortic  stenosis is present.    Past Medical History:  Diagnosis Date  . Anemia   . Asthma    "I dont think I have it.  . CHF (congestive heart failure) ([place])   . Depression   . Emphysema of lung ([place])   . GERD (gastroesophageal reflux disease)   . Hemorrhoids   . History of kidney stones    Cystoscopy  . Hyperlipidemia   . Hypertension   . Hypothyroidism   . Obesity   . Pre-diabetes   . Rosacea   . Tubular adenoma of colon 03/26/2019   Colonoscopy repeat in 3 years    Past Surgical History:  Procedure Laterality Date  . ABDOMINAL HYSTERECTOMY    . ANTERIOR AND POSTERIOR VAGINAL REPAIR  2020  . CARDIAC CATHETERIZATION  11/01/2019  . CATARACT EXTRACTION W/ INTRAOCULAR LENS IMPLANT Bilateral 2016  . COLONOSCOPY    . CYSTOSCOPY    . DENTAL SURGERY  2016   Dental Implant  . EYE SURGERY Bilateral    cataract  . FRACTURE SURGERY Left 2013   Left ankle  . medtronix nerve stimulator  07/18/2019   Complete InterStim Sacral Neuromudulation system implantation 07/18/19, [facility]  . NASAL SINUS SURGERY    . RIGHT/LEFT HEART CATH AND CORONARY ANGIOGRAPHY N/A 11/01/2019   Procedure: RIGHT/LEFT HEART CATH AND CORONARY ANGIOGRAPHY;  Surgeon: Martinique, Peter M, MD;  Location: [facility];  Service: Cardiovascular;  Laterality: N/A;  . VAGINECTOMY, PARTIAL      MEDICATIONS: . cephALEXin (KEFLEX) 250 MG capsule  . cetirizine (ZYRTEC) 10 MG tablet  . dapagliflozin propanediol (FARXIGA) 5 MG TABS tablet  . diphenhydrAMINE (BENADRYL) 25 mg capsule  . FARXIGA 10 MG TABS tablet  . fluticasone (FLONASE) 50 MCG/ACT nasal spray  . levothyroxine (SYNTHROID) 125 MCG tablet  . metoprolol succinate (TOPROL XL) 25 MG 24 hr tablet  . montelukast (SINGULAIR) 10 MG tablet  . Multiple Vitamin (MULTIVITAMIN WITH MINERALS) TABS tablet  . omeprazole (PRILOSEC) 20 MG capsule  . rosuvastatin (CRESTOR) 10 MG tablet  . sacubitril-valsartan (ENTRESTO) 49-51 MG  . venlafaxine XR (EFFEXOR-XR) 150 MG 24 hr  capsule   . sodium chloride flush (NS) 0.9 % injection 3 mL   Per current list, she is taking Farxiga 10 mg and not 5 mg.    Myra Gianotti, PA-C Surgical Short Stay/Anesthesiology [facility] Phone [phone] [facility] Phone [phone] 12/12/2019 12:21 PM

## 2019-12-12 ENCOUNTER — Telehealth: Payer: Self-pay | Admitting: Internal Medicine

## 2019-12-12 NOTE — Telephone Encounter (Signed)
Spoke with patient regarding prior message.Patient is having her Biopsy procedure tomorrow with Dr.Hendrickson. Patient was wondering when [facility] would like patient to come for a f/u.   Dr.Ramaswamy can you please advise when you would like patient to come back in for a f/u after patient's procedure.   Thank you

## 2019-12-12 NOTE — Telephone Encounter (Signed)
Called and spoke with patient.  She is considering canceling her surgery because she cannot see Dr. Chase Caller prior to her surgery.  I let her know I would send a message to him and let him know she would like to speak with him prior to her surgery.  I also scheduled her an appointment with him for Monday Oct 4th at 11 am.  She stated she may still be in the hospital if she has the surgery.  Advised for her to just let her know if she cannot make the appointment.  She verbalized understanding.  Dr. Chase Caller, please advise.

## 2019-12-12 NOTE — Anesthesia Preprocedure Evaluation (Addendum)
Anesthesia Evaluation  Patient identified by MRN, date of birth, ID band Patient awake    Reviewed: Allergy & Precautions, NPO status , Patient's Chart, lab work & pertinent test results  Airway Mallampati: II  TM Distance: >3 FB Neck ROM: Full    Dental  (+) Teeth Intact, Dental Advisory Given   Pulmonary former smoker,     + decreased breath sounds      Cardiovascular hypertension,  Rhythm:Regular Rate:Normal     Neuro/Psych    GI/Hepatic   Endo/Other    Renal/GU      Musculoskeletal   Abdominal (+) + obese,   Peds  Hematology   Anesthesia Other Findings   Reproductive/Obstetrics                            Anesthesia Physical Anesthesia Plan  ASA: III  Anesthesia Plan: General   Post-op Pain Management:    Induction: Intravenous  PONV Risk Score and Plan: Ondansetron and Dexamethasone  Airway Management Planned: Oral ETT  Additional Equipment: CVP and Arterial line  Intra-op Plan:   Post-operative Plan: Extubation in OR  Informed Consent: I have reviewed the patients History and Physical, chart, labs and discussed the procedure including the risks, benefits and alternatives for the proposed anesthesia with the patient or authorized representative who has indicated his/her understanding and acceptance.     Dental advisory given  Plan Discussed with: CRNA and Anesthesiologist  Anesthesia Plan Comments: (PAT note written 12/12/2019 by Myra Gianotti, PA-C. )       Anesthesia Quick Evaluation

## 2019-12-13 ENCOUNTER — Inpatient Hospital Stay (HOSPITAL_COMMUNITY): Payer: Medicare Other | Admitting: Certified Registered"

## 2019-12-13 ENCOUNTER — Inpatient Hospital Stay (HOSPITAL_COMMUNITY): Payer: Medicare Other

## 2019-12-13 ENCOUNTER — Encounter (HOSPITAL_COMMUNITY): Payer: Self-pay | Admitting: Thoracic Surgery (Cardiothoracic Vascular Surgery)

## 2019-12-13 ENCOUNTER — Encounter (HOSPITAL_COMMUNITY)
Admission: RE | Disposition: A | Discharge status: Home / Self Care | Payer: Self-pay | Source: Ambulatory Visit | Attending: Thoracic Surgery (Cardiothoracic Vascular Surgery)

## 2019-12-13 ENCOUNTER — Other Ambulatory Visit: Payer: Self-pay

## 2019-12-13 ENCOUNTER — Inpatient Hospital Stay (HOSPITAL_COMMUNITY)
Admission: RE | Admit: 2019-12-13 | Discharge: 2019-12-16 | DRG: 167 | Disposition: A | Discharge status: Home / Self Care | Payer: Medicare Other | Source: Ambulatory Visit | Attending: Thoracic Surgery (Cardiothoracic Vascular Surgery) | Admitting: Thoracic Surgery (Cardiothoracic Vascular Surgery)

## 2019-12-13 ENCOUNTER — Inpatient Hospital Stay (HOSPITAL_COMMUNITY): Payer: Medicare Other | Admitting: Vascular Surgery

## 2019-12-13 DIAGNOSIS — Z87891 Personal history of nicotine dependence: Secondary | ICD-10-CM | POA: Diagnosis not present

## 2019-12-13 DIAGNOSIS — E119 Type 2 diabetes mellitus without complications: Secondary | ICD-10-CM | POA: Diagnosis present

## 2019-12-13 DIAGNOSIS — Z9889 Other specified postprocedural states: Secondary | ICD-10-CM

## 2019-12-13 DIAGNOSIS — Z79890 Hormone replacement therapy (postmenopausal): Secondary | ICD-10-CM

## 2019-12-13 DIAGNOSIS — E039 Hypothyroidism, unspecified: Secondary | ICD-10-CM | POA: Diagnosis present

## 2019-12-13 DIAGNOSIS — E669 Obesity, unspecified: Secondary | ICD-10-CM | POA: Diagnosis present

## 2019-12-13 DIAGNOSIS — Z09 Encounter for follow-up examination after completed treatment for conditions other than malignant neoplasm: Secondary | ICD-10-CM

## 2019-12-13 DIAGNOSIS — J449 Chronic obstructive pulmonary disease, unspecified: Secondary | ICD-10-CM | POA: Diagnosis present

## 2019-12-13 DIAGNOSIS — J849 Interstitial pulmonary disease, unspecified: Secondary | ICD-10-CM

## 2019-12-13 DIAGNOSIS — I10 Essential (primary) hypertension: Secondary | ICD-10-CM | POA: Diagnosis present

## 2019-12-13 DIAGNOSIS — Z6831 Body mass index (BMI) 31.0-31.9, adult: Secondary | ICD-10-CM | POA: Diagnosis not present

## 2019-12-13 DIAGNOSIS — J45909 Unspecified asthma, uncomplicated: Secondary | ICD-10-CM | POA: Diagnosis present

## 2019-12-13 DIAGNOSIS — Z79899 Other long term (current) drug therapy: Secondary | ICD-10-CM | POA: Diagnosis not present

## 2019-12-13 DIAGNOSIS — J8410 Pulmonary fibrosis, unspecified: Secondary | ICD-10-CM | POA: Diagnosis present

## 2019-12-13 DIAGNOSIS — Z20822 Contact with and (suspected) exposure to covid-19: Secondary | ICD-10-CM | POA: Diagnosis present

## 2019-12-13 DIAGNOSIS — I493 Ventricular premature depolarization: Secondary | ICD-10-CM | POA: Diagnosis present

## 2019-12-13 DIAGNOSIS — E785 Hyperlipidemia, unspecified: Secondary | ICD-10-CM | POA: Diagnosis present

## 2019-12-13 DIAGNOSIS — I428 Other cardiomyopathies: Secondary | ICD-10-CM | POA: Diagnosis present

## 2019-12-13 DIAGNOSIS — Z4682 Encounter for fitting and adjustment of non-vascular catheter: Secondary | ICD-10-CM

## 2019-12-13 HISTORY — PX: LUNG BIOPSY: SHX5088

## 2019-12-13 HISTORY — PX: LYMPH NODE BIOPSY: SHX201

## 2019-12-13 HISTORY — PX: VIDEO BRONCHOSCOPY: SHX5072

## 2019-12-13 HISTORY — PX: INTERCOSTAL NERVE BLOCK: SHX5021

## 2019-12-13 LAB — GLUCOSE, CAPILLARY
Glucose-Capillary: 136 mg/dL — ABNORMAL HIGH (ref 70–99)
Glucose-Capillary: 176 mg/dL — ABNORMAL HIGH (ref 70–99)
Glucose-Capillary: 242 mg/dL — ABNORMAL HIGH (ref 70–99)

## 2019-12-13 LAB — BODY FLUID CELL COUNT WITH DIFFERENTIAL
Eos, Fluid: 8 %
Lymphs, Fluid: 25 %
Monocyte-Macrophage-Serous Fluid: 23 % — ABNORMAL LOW (ref 50–90)
Neutrophil Count, Fluid: 44 % — ABNORMAL HIGH (ref 0–25)
Total Nucleated Cell Count, Fluid: 18 cu mm (ref 0–1000)

## 2019-12-13 SURGERY — XI ROBOTIC ASSISTED THORACOSCOPY - DNU
Anesthesia: General | Site: Chest | Laterality: Right

## 2019-12-13 MED ORDER — FENTANYL CITRATE (PF) 100 MCG/2ML IJ SOLN
INTRAMUSCULAR | Status: DC | PRN
Start: 2019-12-13 — End: 2019-12-13
  Administered 2019-12-13 (×5): 50 ug via INTRAVENOUS

## 2019-12-13 MED ORDER — TRAMADOL HCL 50 MG PO TABS
ORAL_TABLET | ORAL | Status: AC
  Filled 2019-12-13: qty 1

## 2019-12-13 MED ORDER — PHENYLEPHRINE HCL-NACL 10-0.9 MG/250ML-% IV SOLN: INTRAVENOUS | Status: DC | PRN

## 2019-12-13 MED ORDER — LIDOCAINE 2% (20 MG/ML) 5 ML SYRINGE
INTRAMUSCULAR | Status: DC | PRN
  Administered 2019-12-13: 40 mg via INTRAVENOUS

## 2019-12-13 MED ORDER — ONDANSETRON HCL 4 MG/2ML IJ SOLN
INTRAMUSCULAR | Status: DC | PRN
  Administered 2019-12-13: 4 mg via INTRAVENOUS

## 2019-12-13 MED ORDER — PANTOPRAZOLE SODIUM 40 MG PO TBEC
40.0000 mg | DELAYED_RELEASE_TABLET | Freq: Every day | ORAL | Status: DC
  Administered 2019-12-14 – 2019-12-16 (×3): 40 mg via ORAL
  Filled 2019-12-13 (×3): qty 1

## 2019-12-13 MED ORDER — PROPOFOL 10 MG/ML IV BOLUS
INTRAVENOUS | Status: DC | PRN
  Administered 2019-12-13: 30 mg via INTRAVENOUS
  Administered 2019-12-13: 120 mg via INTRAVENOUS

## 2019-12-13 MED ORDER — EPHEDRINE SULFATE 50 MG/ML IJ SOLN
INTRAMUSCULAR | Status: DC | PRN
  Administered 2019-12-13: 5 mg via INTRAVENOUS

## 2019-12-13 MED ORDER — SUCCINYLCHOLINE CHLORIDE 200 MG/10ML IV SOSY
PREFILLED_SYRINGE | INTRAVENOUS | Status: AC
  Filled 2019-12-13: qty 10

## 2019-12-13 MED ORDER — LIDOCAINE 2% (20 MG/ML) 5 ML SYRINGE
INTRAMUSCULAR | Status: AC
  Filled 2019-12-13: qty 5

## 2019-12-13 MED ORDER — MONTELUKAST SODIUM 10 MG PO TABS
10.0000 mg | ORAL_TABLET | Freq: Every day | ORAL | Status: DC
  Administered 2019-12-13 – 2019-12-15 (×4): 10 mg via ORAL
  Filled 2019-12-13 (×4): qty 1

## 2019-12-13 MED ORDER — ROCURONIUM BROMIDE 100 MG/10ML IV SOLN
INTRAVENOUS | Status: DC | PRN
  Administered 2019-12-13: 50 mg via INTRAVENOUS
  Administered 2019-12-13: 30 mg via INTRAVENOUS

## 2019-12-13 MED ORDER — FENTANYL CITRATE (PF) 250 MCG/5ML IJ SOLN
INTRAMUSCULAR | Status: AC
  Filled 2019-12-13: qty 5

## 2019-12-13 MED ORDER — FENTANYL CITRATE (PF) 100 MCG/2ML IJ SOLN
INTRAMUSCULAR | Status: AC
  Filled 2019-12-13: qty 2

## 2019-12-13 MED ORDER — PHENYLEPHRINE HCL-NACL 10-0.9 MG/250ML-% IV SOLN
INTRAVENOUS | Status: AC
  Filled 2019-12-13: qty 250

## 2019-12-13 MED ORDER — PHENYLEPHRINE 40 MCG/ML (10ML) SYRINGE FOR IV PUSH (FOR BLOOD PRESSURE SUPPORT)
PREFILLED_SYRINGE | INTRAVENOUS | Status: AC
  Filled 2019-12-13: qty 10

## 2019-12-13 MED ORDER — LACTATED RINGERS IV SOLN: INTRAVENOUS | Status: DC

## 2019-12-13 MED ORDER — ONDANSETRON HCL 4 MG/2ML IJ SOLN: 4.0000 mg | Freq: Four times a day (QID) | INTRAMUSCULAR | Status: DC | PRN

## 2019-12-13 MED ORDER — SODIUM CHLORIDE 0.9 % IV SOLN: INTRAVENOUS | Status: DC

## 2019-12-13 MED ORDER — KETOROLAC TROMETHAMINE 15 MG/ML IJ SOLN
15.0000 mg | Freq: Four times a day (QID) | INTRAMUSCULAR | Status: DC
  Administered 2019-12-13 – 2019-12-14 (×3): 15 mg via INTRAVENOUS
  Filled 2019-12-13 (×3): qty 1

## 2019-12-13 MED ORDER — MIDAZOLAM HCL 5 MG/5ML IJ SOLN
INTRAMUSCULAR | Status: DC | PRN
  Administered 2019-12-13: 2 mg via INTRAVENOUS

## 2019-12-13 MED ORDER — SENNOSIDES-DOCUSATE SODIUM 8.6-50 MG PO TABS
1.0000 | ORAL_TABLET | Freq: Every day | ORAL | Status: DC
  Administered 2019-12-13 – 2019-12-15 (×3): 1 via ORAL
  Filled 2019-12-13 (×3): qty 1

## 2019-12-13 MED ORDER — BISACODYL 5 MG PO TBEC
10.0000 mg | DELAYED_RELEASE_TABLET | Freq: Every day | ORAL | Status: DC
  Filled 2019-12-13: qty 2

## 2019-12-13 MED ORDER — HEMOSTATIC AGENTS (NO CHARGE) OPTIME
TOPICAL | Status: DC | PRN
  Administered 2019-12-13 (×2): 1 via TOPICAL

## 2019-12-13 MED ORDER — MIDAZOLAM HCL 2 MG/2ML IJ SOLN
INTRAMUSCULAR | Status: AC
  Filled 2019-12-13: qty 2

## 2019-12-13 MED ORDER — PROPOFOL 10 MG/ML IV BOLUS
INTRAVENOUS | Status: AC
  Filled 2019-12-13: qty 20

## 2019-12-13 MED ORDER — KETOROLAC TROMETHAMINE 15 MG/ML IJ SOLN
INTRAMUSCULAR | Status: DC | PRN
  Administered 2019-12-13: 15 mg via INTRAVENOUS

## 2019-12-13 MED ORDER — DEXAMETHASONE SODIUM PHOSPHATE 10 MG/ML IJ SOLN
INTRAMUSCULAR | Status: AC
  Filled 2019-12-13: qty 1

## 2019-12-13 MED ORDER — EPHEDRINE 5 MG/ML INJ
INTRAVENOUS | Status: AC
  Filled 2019-12-13: qty 10

## 2019-12-13 MED ORDER — FENTANYL CITRATE (PF) 100 MCG/2ML IJ SOLN
25.0000 ug | INTRAMUSCULAR | Status: DC | PRN
  Administered 2019-12-13 (×3): 50 ug via INTRAVENOUS

## 2019-12-13 MED ORDER — ACETAMINOPHEN 500 MG PO TABS
1000.0000 mg | ORAL_TABLET | Freq: Four times a day (QID) | ORAL | Status: DC
  Administered 2019-12-13 – 2019-12-16 (×11): 1000 mg via ORAL
  Filled 2019-12-13 (×10): qty 2

## 2019-12-13 MED ORDER — SODIUM CHLORIDE 0.9 % IV SOLN: INTRAVENOUS | Status: DC | PRN

## 2019-12-13 MED ORDER — 0.9 % SODIUM CHLORIDE (POUR BTL) OPTIME
TOPICAL | Status: DC | PRN
  Administered 2019-12-13: 2000 mL

## 2019-12-13 MED ORDER — ONDANSETRON HCL 4 MG/2ML IJ SOLN: 4.0000 mg | Freq: Once | INTRAMUSCULAR | Status: DC | PRN

## 2019-12-13 MED ORDER — LACTATED RINGERS IV SOLN: INTRAVENOUS | Status: DC | PRN

## 2019-12-13 MED ORDER — ENOXAPARIN SODIUM 40 MG/0.4ML ~~LOC~~ SOLN
40.0000 mg | Freq: Every day | SUBCUTANEOUS | Status: DC
  Administered 2019-12-13 – 2019-12-16 (×4): 40 mg via SUBCUTANEOUS
  Filled 2019-12-13 (×4): qty 0.4

## 2019-12-13 MED ORDER — BUPIVACAINE HCL (PF) 0.5 % IJ SOLN
INTRAMUSCULAR | Status: AC
  Filled 2019-12-13: qty 30

## 2019-12-13 MED ORDER — SUGAMMADEX SODIUM 200 MG/2ML IV SOLN
INTRAVENOUS | Status: DC | PRN
  Administered 2019-12-13: 200 mg via INTRAVENOUS

## 2019-12-13 MED ORDER — ACETAMINOPHEN 160 MG/5ML PO SOLN
1000.0000 mg | Freq: Four times a day (QID) | ORAL | Status: DC
  Administered 2019-12-14: 1000 mg via ORAL
  Filled 2019-12-13: qty 40.6

## 2019-12-13 MED ORDER — ONDANSETRON HCL 4 MG/2ML IJ SOLN
INTRAMUSCULAR | Status: AC
  Filled 2019-12-13: qty 2

## 2019-12-13 MED ORDER — CEFAZOLIN SODIUM-DEXTROSE 2-4 GM/100ML-% IV SOLN
2.0000 g | Freq: Three times a day (TID) | INTRAVENOUS | Status: AC
  Administered 2019-12-13 (×2): 2 g via INTRAVENOUS
  Filled 2019-12-13 (×2): qty 100

## 2019-12-13 MED ORDER — ORAL CARE MOUTH RINSE: 15.0000 mL | Freq: Once | OROMUCOSAL | Status: AC

## 2019-12-13 MED ORDER — CHLORHEXIDINE GLUCONATE 0.12 % MT SOLN
15.0000 mL | Freq: Once | OROMUCOSAL | Status: AC
  Administered 2019-12-13: 15 mL via OROMUCOSAL
  Filled 2019-12-13: qty 15

## 2019-12-13 MED ORDER — CEFAZOLIN SODIUM-DEXTROSE 2-4 GM/100ML-% IV SOLN
2.0000 g | INTRAVENOUS | Status: AC
  Administered 2019-12-13: 2 g via INTRAVENOUS
  Filled 2019-12-13: qty 100

## 2019-12-13 MED ORDER — VENLAFAXINE HCL ER 75 MG PO CP24
150.0000 mg | ORAL_CAPSULE | Freq: Every day | ORAL | Status: DC
  Administered 2019-12-14 – 2019-12-16 (×3): 150 mg via ORAL
  Filled 2019-12-13 (×3): qty 2

## 2019-12-13 MED ORDER — PHENYLEPHRINE HCL (PRESSORS) 10 MG/ML IV SOLN
INTRAVENOUS | Status: DC | PRN
  Administered 2019-12-13 (×2): 40 ug via INTRAVENOUS

## 2019-12-13 MED ORDER — ROSUVASTATIN CALCIUM 5 MG PO TABS
10.0000 mg | ORAL_TABLET | Freq: Every day | ORAL | Status: DC
  Administered 2019-12-14 – 2019-12-16 (×3): 10 mg via ORAL
  Filled 2019-12-13 (×3): qty 2

## 2019-12-13 MED ORDER — INSULIN ASPART 100 UNIT/ML ~~LOC~~ SOLN
0.0000 [IU] | Freq: Three times a day (TID) | SUBCUTANEOUS | Status: DC
  Administered 2019-12-13: 8 [IU] via SUBCUTANEOUS
  Administered 2019-12-13 – 2019-12-14 (×2): 4 [IU] via SUBCUTANEOUS

## 2019-12-13 MED ORDER — SODIUM CHLORIDE FLUSH 0.9 % IV SOLN
INTRAVENOUS | Status: DC | PRN
  Administered 2019-12-13: 100 mL

## 2019-12-13 MED ORDER — TRAMADOL HCL 50 MG PO TABS
50.0000 mg | ORAL_TABLET | Freq: Four times a day (QID) | ORAL | Status: DC | PRN
  Administered 2019-12-13 – 2019-12-14 (×3): 100 mg via ORAL
  Filled 2019-12-13 (×2): qty 2

## 2019-12-13 MED ORDER — LEVOTHYROXINE SODIUM 25 MCG PO TABS
125.0000 ug | ORAL_TABLET | Freq: Every day | ORAL | Status: DC
  Administered 2019-12-14 – 2019-12-16 (×4): 125 ug via ORAL
  Filled 2019-12-13 (×4): qty 1

## 2019-12-13 MED ORDER — PHENYLEPHRINE HCL-NACL 10-0.9 MG/250ML-% IV SOLN
INTRAVENOUS | Status: DC | PRN
  Administered 2019-12-13: 25 ug/min via INTRAVENOUS

## 2019-12-13 MED ORDER — SODIUM CHLORIDE 0.9 % IR SOLN
Status: DC | PRN
  Administered 2019-12-13: 1000 mL

## 2019-12-13 MED ORDER — ROCURONIUM BROMIDE 10 MG/ML (PF) SYRINGE
PREFILLED_SYRINGE | INTRAVENOUS | Status: AC
  Filled 2019-12-13: qty 10

## 2019-12-13 SURGICAL SUPPLY — 135 items
ADAPTER VALVE BIOPSY EBUS (MISCELLANEOUS) IMPLANT
ADH SKN CLS APL DERMABOND .7 (GAUZE/BANDAGES/DRESSINGS) ×3
ADPTR VALVE BIOPSY EBUS (MISCELLANEOUS)
APPLIER CLIP ROT 10 11.4 M/L (STAPLE)
APR CLP MED LRG 11.4X10 (STAPLE)
BAG SPEC RTRVL C125 8X14 (MISCELLANEOUS) ×3
BLADE CLIPPER SURG (BLADE) ×6 IMPLANT
BRUSH CYTOL CELLEBRITY 1.5X140 (MISCELLANEOUS) IMPLANT
CANISTER SUCT 3000ML PPV (MISCELLANEOUS) ×7 IMPLANT
CANNULA REDUC XI 12-8 STAPL (CANNULA) ×8
CANNULA REDUCER 12-8 DVNC XI (CANNULA) ×6 IMPLANT
CATH THORACIC 28FR (CATHETERS) IMPLANT
CATH THORACIC 28FR RT ANG (CATHETERS) IMPLANT
CATH THORACIC 36FR (CATHETERS) IMPLANT
CATH THORACIC 36FR RT ANG (CATHETERS) IMPLANT
CLIP APPLIE ROT 10 11.4 M/L (STAPLE) IMPLANT
CLIP VESOCCLUDE MED 6/CT (CLIP) IMPLANT
CNTNR URN SCR LID CUP LEK RST (MISCELLANEOUS) ×21 IMPLANT
CONN ST 1/4X3/8  BEN (MISCELLANEOUS) ×4
CONN ST 1/4X3/8 BEN (MISCELLANEOUS) IMPLANT
CONN Y 3/8X3/8X3/8  BEN (MISCELLANEOUS)
CONN Y 3/8X3/8X3/8 BEN (MISCELLANEOUS) IMPLANT
CONT SPEC 4OZ STRL OR WHT (MISCELLANEOUS) ×44
COVER BACK TABLE 60X90IN (DRAPES) ×4 IMPLANT
DEFOGGER SCOPE WARMER CLEARIFY (MISCELLANEOUS) ×4 IMPLANT
DERMABOND ADVANCED (GAUZE/BANDAGES/DRESSINGS) ×1
DERMABOND ADVANCED .7 DNX12 (GAUZE/BANDAGES/DRESSINGS) IMPLANT
DRAIN CHANNEL 28F RND 3/8 FF (WOUND CARE) ×1 IMPLANT
DRAIN CHANNEL 32F RND 10.7 FF (WOUND CARE) IMPLANT
DRAPE ARM DVNC X/XI (DISPOSABLE) ×12 IMPLANT
DRAPE COLUMN DVNC XI (DISPOSABLE) ×3 IMPLANT
DRAPE CV SPLIT W-CLR ANES SCRN (DRAPES) ×4 IMPLANT
DRAPE DA VINCI XI ARM (DISPOSABLE) ×16
DRAPE DA VINCI XI COLUMN (DISPOSABLE) ×4
DRAPE INCISE IOBAN 66X45 STRL (DRAPES) IMPLANT
DRAPE ORTHO SPLIT 77X108 STRL (DRAPES) ×4
DRAPE SURG ORHT 6 SPLT 77X108 (DRAPES) ×3 IMPLANT
ELECT BLADE 6.5 EXT (BLADE) IMPLANT
ELECT REM PT RETURN 9FT ADLT (ELECTROSURGICAL) ×4
ELECTRODE REM PT RTRN 9FT ADLT (ELECTROSURGICAL) ×3 IMPLANT
FILTER STRAW FLUID ASPIR (MISCELLANEOUS) IMPLANT
FORCEPS BIOP RJ4 1.8 (CUTTING FORCEPS) IMPLANT
FORCEPS RADIAL JAW LRG 4 PULM (INSTRUMENTS) IMPLANT
GAUZE SPONGE 4X4 12PLY STRL (GAUZE/BANDAGES/DRESSINGS) ×7 IMPLANT
GLOVE SURG SIGNA 7.5 PF LTX (GLOVE) ×6 IMPLANT
GLOVE TRIUMPH SURG SIZE 7.5 (KITS) ×8 IMPLANT
GOWN STRL REUS W/ TWL LRG LVL3 (GOWN DISPOSABLE) ×3 IMPLANT
GOWN STRL REUS W/ TWL XL LVL3 (GOWN DISPOSABLE) ×3 IMPLANT
GOWN STRL REUS W/TWL LRG LVL3 (GOWN DISPOSABLE) ×16
GOWN STRL REUS W/TWL XL LVL3 (GOWN DISPOSABLE) ×8
HEMOSTAT SURGICEL 2X14 (HEMOSTASIS) ×11 IMPLANT
IRRIGATION STRYKERFLOW (MISCELLANEOUS) ×3 IMPLANT
IRRIGATOR STRYKERFLOW (MISCELLANEOUS) ×4
IRRIGATOR SUCT 8 DISP DVNC XI (IRRIGATION / IRRIGATOR) IMPLANT
IRRIGATOR SUCTION 8MM XI DISP (IRRIGATION / IRRIGATOR)
KIT BASIN OR (CUSTOM PROCEDURE TRAY) ×4 IMPLANT
KIT CLEAN ENDO COMPLIANCE (KITS) ×4 IMPLANT
KIT SUCTION CATH 14FR (SUCTIONS) IMPLANT
KIT TURNOVER KIT B (KITS) ×8 IMPLANT
LOOP VESSEL SUPERMAXI WHITE (MISCELLANEOUS) IMPLANT
MARKER SKIN DUAL TIP RULER LAB (MISCELLANEOUS) ×4 IMPLANT
NDL HYPO 25GX1X1/2 BEV (NEEDLE) ×3 IMPLANT
NDL SPNL 18GX3.5 QUINCKE PK (NEEDLE) ×3 IMPLANT
NEEDLE HYPO 25GX1X1/2 BEV (NEEDLE) ×4 IMPLANT
NEEDLE SPNL 18GX3.5 QUINCKE PK (NEEDLE) ×4 IMPLANT
NS IRRIG 1000ML POUR BTL (IV SOLUTION) ×8 IMPLANT
OIL SILICONE PENTAX (PARTS (SERVICE/REPAIRS)) ×4 IMPLANT
PACK CHEST (CUSTOM PROCEDURE TRAY) ×4 IMPLANT
PAD ARMBOARD 7.5X6 YLW CONV (MISCELLANEOUS) ×16 IMPLANT
PORT ACCESS TROCAR AIRSEAL 12 (TROCAR) IMPLANT
PORT ACCESS TROCAR AIRSEAL 5M (TROCAR) ×1
RADIAL JAW LRG 4 PULMONARY (INSTRUMENTS)
RELOAD STAPLE 45 3.5 BLU DVNC (STAPLE) IMPLANT
RELOAD STAPLE 45 4.3 GRN DVNC (STAPLE) IMPLANT
RELOAD STAPLER 3.5X45 BLU DVNC (STAPLE) ×30 IMPLANT
RELOAD STAPLER 4.3X45 GRN DVNC (STAPLE) ×6 IMPLANT
SCISSORS LAP 5X35 DISP (ENDOMECHANICALS) IMPLANT
SEAL CANN UNIV 5-8 DVNC XI (MISCELLANEOUS) ×6 IMPLANT
SEAL XI 5MM-8MM UNIVERSAL (MISCELLANEOUS) ×8
SEALANT PROGEL (MISCELLANEOUS) IMPLANT
SEALANT SURG COSEAL 4ML (VASCULAR PRODUCTS) IMPLANT
SEALANT SURG COSEAL 8ML (VASCULAR PRODUCTS) IMPLANT
SET TRI-LUMEN FLTR TB AIRSEAL (TUBING) ×1 IMPLANT
SET TUBE SMOKE EVAC HIGH FLOW (TUBING) IMPLANT
SHEARS HARMONIC HDI 20CM (ELECTROSURGICAL) IMPLANT
SPECIMEN JAR MEDIUM (MISCELLANEOUS) IMPLANT
SPONGE INTESTINAL PEANUT (DISPOSABLE) IMPLANT
SPONGE TONSIL TAPE 1 RFD (DISPOSABLE) IMPLANT
STAPLER 45 DA VINCI SURE FORM (STAPLE) ×4
STAPLER 45 SUREFORM DVNC (STAPLE) IMPLANT
STAPLER CANNULA SEAL DVNC XI (STAPLE) ×6 IMPLANT
STAPLER CANNULA SEAL XI (STAPLE) ×8
STAPLER RELOAD 3.5X45 BLU DVNC (STAPLE) ×30
STAPLER RELOAD 3.5X45 BLUE (STAPLE) ×40
STAPLER RELOAD 4.3X45 GREEN (STAPLE) ×8
STAPLER RELOAD 4.3X45 GRN DVNC (STAPLE) ×6
SUT PDS AB 3-0 SH 27 (SUTURE) IMPLANT
SUT PROLENE 4 0 RB 1 (SUTURE)
SUT PROLENE 4-0 RB1 .5 CRCL 36 (SUTURE) IMPLANT
SUT SILK  1 MH (SUTURE) ×4
SUT SILK 1 MH (SUTURE) ×3 IMPLANT
SUT SILK 1 TIES 10X30 (SUTURE) IMPLANT
SUT SILK 2 0 SH (SUTURE) IMPLANT
SUT SILK 2 0SH CR/8 30 (SUTURE) ×1 IMPLANT
SUT SILK 3 0 SH 30 (SUTURE) IMPLANT
SUT SILK 3 0SH CR/8 30 (SUTURE) IMPLANT
SUT VIC AB 1 CTX 36 (SUTURE)
SUT VIC AB 1 CTX36XBRD ANBCTR (SUTURE) IMPLANT
SUT VIC AB 2-0 CTX 36 (SUTURE) IMPLANT
SUT VIC AB 3-0 MH 27 (SUTURE) IMPLANT
SUT VIC AB 3-0 SH 27 (SUTURE) ×4
SUT VIC AB 3-0 SH 27X BRD (SUTURE) IMPLANT
SUT VIC AB 3-0 X1 27 (SUTURE) ×8 IMPLANT
SUT VICRYL 0 TIES 12 18 (SUTURE) ×4 IMPLANT
SUT VICRYL 0 UR6 27IN ABS (SUTURE) ×8 IMPLANT
SUT VICRYL 2 TP 1 (SUTURE) IMPLANT
SYR 20ML ECCENTRIC (SYRINGE) ×8 IMPLANT
SYR 30ML LL (SYRINGE) ×4 IMPLANT
SYR 5ML LL (SYRINGE) ×4 IMPLANT
SYR 5ML LUER SLIP (SYRINGE) ×4 IMPLANT
SYSTEM RETRIEVAL ANCHOR 8 (MISCELLANEOUS) ×1 IMPLANT
SYSTEM SAHARA CHEST DRAIN ATS (WOUND CARE) ×4 IMPLANT
TAPE CLOTH 4X10 WHT NS (GAUZE/BANDAGES/DRESSINGS) ×4 IMPLANT
TAPE CLOTH SURG 4X10 WHT LF (GAUZE/BANDAGES/DRESSINGS) ×1 IMPLANT
TIP APPLICATOR SPRAY EXTEND 16 (VASCULAR PRODUCTS) IMPLANT
TOWEL GREEN STERILE (TOWEL DISPOSABLE) ×9 IMPLANT
TOWEL GREEN STERILE FF (TOWEL DISPOSABLE) ×4 IMPLANT
TRAP SPECIMEN MUCUS 40CC (MISCELLANEOUS) ×3 IMPLANT
TRAY FOLEY MTR SLVR 16FR STAT (SET/KITS/TRAYS/PACK) ×4 IMPLANT
TROCAR XCEL 12X100 BLDLESS (ENDOMECHANICALS) ×3 IMPLANT
TUBE CONNECTING 20X1/4 (TUBING) ×4 IMPLANT
VALVE BIOPSY  SINGLE USE (MISCELLANEOUS) ×4
VALVE BIOPSY SINGLE USE (MISCELLANEOUS) ×3 IMPLANT
VALVE SUCTION BRONCHIO DISP (MISCELLANEOUS) ×4 IMPLANT
WATER STERILE IRR 1000ML POUR (IV SOLUTION) ×4 IMPLANT

## 2019-12-13 NOTE — Anesthesia Procedure Notes (Signed)
Procedure Name: Intubation Date/Time: 12/13/2019 8:13 AM Performed by: Gaylene Brooks, CRNA Pre-anesthesia Checklist: Patient identified, Emergency Drugs available, Suction available and Patient being monitored Patient Re-evaluated:Patient Re-evaluated prior to induction Oxygen Delivery Method: Circle System Utilized Preoxygenation: Pre-oxygenation with 100% oxygen Induction Type: IV induction Laryngoscope Size: Mac and 3 Grade View: Grade II Endobronchial tube: Left, Double lumen EBT, EBT position confirmed by auscultation and EBT position confirmed by fiberoptic bronchoscope and 37 Fr Number of attempts: 2 Airway Equipment and Method: Stylet Placement Confirmation: ETT inserted through vocal cords under direct vision,  positive ETCO2 and breath sounds checked- equal and bilateral Tube secured with: Tape Dental Injury: Teeth and Oropharynx as per pre-operative assessment  Comments: Cook catheter inserted through 8.5 ETT. ETT removed over stylet. DLT advanced over catheter without difficulty. DLT positioned correctly after several attempts by Dr. Linna Caprice with the aid of the fiberoptic scope. Small cut noted to lip during tube manipulation. Dr. Linna Caprice present throughout tube exchange.

## 2019-12-13 NOTE — Anesthesia Postprocedure Evaluation (Signed)
Anesthesia Post Note  Patient: Susan Hunt  Procedure(s) Performed: XI ROBOTIC ASSISTED THORASCOPY (Right ) LUNG BIOPSY (Right ) LYMPH NODE BIOPSY (Right ) VIDEO BRONCHOSCOPY (N/A ) INTERCOSTAL NERVE BLOCK (Right Chest)     Patient location during evaluation: PACU Anesthesia Type: General Level of consciousness: awake and alert Pain management: pain level controlled Vital Signs Assessment: post-procedure vital signs reviewed and stable Respiratory status: spontaneous breathing, nonlabored ventilation, respiratory function stable and patient connected to nasal cannula oxygen Cardiovascular status: blood pressure returned to baseline and stable Postop Assessment: no apparent nausea or vomiting Anesthetic complications: no   No complications documented.  Last Vitals:  Vitals:   12/13/19 1230 12/13/19 1245  BP: (!) 141/81   Pulse: 79 81  Resp: 20 17  Temp:    SpO2: 99% 97%    Last Pain:  Vitals:   12/13/19 1145  TempSrc:   PainSc: 0-No pain                 Kamali Nephew COKER     

## 2019-12-13 NOTE — Addendum Note (Signed)
Addendum  created 12/13/19 1258 by Roberts Gaudy, MD   Clinical Note Signed

## 2019-12-13 NOTE — Anesthesia Procedure Notes (Signed)
Central Venous Catheter Insertion Performed by: Roberts Gaudy, MD, anesthesiologist Start/End10/03/2019 7:05 AM, 12/13/2019 7:15 AM Patient location: Pre-op. Preanesthetic checklist: patient identified, IV checked, site marked, risks and benefits discussed, surgical consent, monitors and equipment checked, pre-op evaluation, timeout performed and anesthesia consent Lidocaine 1% used for infiltration and patient sedated Hand hygiene performed  and maximum sterile barriers used  Catheter size: 8 Fr Total catheter length 16. Central line was placed.Double lumen Procedure performed using ultrasound guided technique. Ultrasound Notes:image(s) printed for medical record Attempts: 1 Following insertion, dressing applied and line sutured. Post procedure assessment: blood return through all ports  Patient tolerated the procedure well with no immediate complications.

## 2019-12-13 NOTE — Anesthesia Postprocedure Evaluation (Signed)
Anesthesia Post Note  Patient: ZALEY TALLEY  Procedure(s) Performed: XI ROBOTIC ASSISTED THORASCOPY (Right ) LUNG BIOPSY (Right ) LYMPH NODE BIOPSY (Right ) VIDEO BRONCHOSCOPY (N/A ) INTERCOSTAL NERVE BLOCK (Right Chest)     Patient location during evaluation: PACU Anesthesia Type: General Level of consciousness: awake and alert Pain management: pain level controlled Vital Signs Assessment: post-procedure vital signs reviewed and stable Respiratory status: spontaneous breathing, nonlabored ventilation, respiratory function stable and patient connected to nasal cannula oxygen Cardiovascular status: blood pressure returned to baseline and stable Postop Assessment: no apparent nausea or vomiting Anesthetic complications: no   No complications documented.  Last Vitals:  Vitals:   12/13/19 1230 12/13/19 1245  BP: (!) 141/81   Pulse: 79 81  Resp: 20 17  Temp:    SpO2: 99% 97%    Last Pain:  Vitals:   12/13/19 1145  TempSrc:   PainSc: 0-No pain                 Artis Buechele COKER

## 2019-12-13 NOTE — Interval H&P Note (Signed)
History and Physical Interval Note:  12/13/2019 7:14 AM  Susan Hunt  has presented today for surgery, with the diagnosis of ILD.  The various methods of treatment have been discussed with the patient and family. After consideration of risks, benefits and other options for treatment, the patient has consented to  Procedure(s): XI ROBOTIC ASSISTED THORASCOPY (Right) LUNG BIOPSY (Right) LYMPH NODE BIOPSY (Right) VIDEO BRONCHOSCOPY (N/A) as a surgical intervention.  The patient's history has been reviewed, patient examined, no change in status, stable for surgery.  I have reviewed the patient's chart and labs.  Questions were answered to the patient's satisfaction.     Melrose Nakayama

## 2019-12-13 NOTE — Anesthesia Procedure Notes (Signed)
Procedure Name: Intubation Date/Time: 12/13/2019 7:48 AM Performed by: Gaylene Brooks, CRNA Pre-anesthesia Checklist: Patient identified, Emergency Drugs available, Suction available and Patient being monitored Patient Re-evaluated:Patient Re-evaluated prior to induction Oxygen Delivery Method: Circle System Utilized Preoxygenation: Pre-oxygenation with 100% oxygen Induction Type: IV induction Ventilation: Mask ventilation without difficulty Laryngoscope Size: Miller and 2 Grade View: Grade I Tube type: Oral Tube size: 8.5 mm Number of attempts: 1 Airway Equipment and Method: Stylet and Oral airway Placement Confirmation: ETT inserted through vocal cords under direct vision,  positive ETCO2 and breath sounds checked- equal and bilateral Secured at: 22 cm Tube secured with: Tape Dental Injury: Teeth and Oropharynx as per pre-operative assessment  Comments: Intubation performed by Lajean Manes, SRNA. After intubation, small rough edge noted to front right tooth. MDA aware.

## 2019-12-13 NOTE — Discharge Instructions (Signed)
Robot-Assisted Thoracic Surgery    Robot-assisted thoracic surgery is a procedure in which robotic arms and surgical instruments are used to perform complex procedures through small incisions. During surgery, the surgeon sits at a console in the operating room and uses controls at the console to move the robotic arms. Surgical instruments are attached to the ends of the robotic arms. Instruments include a tool with a light and camera on the end of it (thoracoscope). The camera sends images to a video monitor that your surgeon will use to view the inside of your thoracic area. The thoracic area is between the neck and abdomen. Robot-assisted surgery may be done to:  Remove a tissue sample to be tested in a lab (biopsy).  Remove a part of a lung (lobectomy) or the whole lung (pneumonectomy).  Remove tumors.  Treat other conditions that affect: ? Your esophagus. This is the part of your body that carries food and liquid from your mouth to your stomach. ? Your diaphragm. This is a muscle wall between your lungs and stomach area. It helps with breathing.  Remove a portion of the nerves (sympathectomy) that cause excessive sweating. Tell a health care provider about:  Any allergies you have. This is especially important if you have allergies to medicines or sedatives.  All medicines you are taking, including vitamins, herbs, eye drops, creams, and over-the-counter medicines.  Any problems you or family members have had with anesthetic medicines.  Any blood disorders you have.  Any surgeries you have had.  Any medical conditions you have.  Whether you are pregnant or may be pregnant. What are the risks? Generally, this is a safe procedure. However, problems may occur, including:  Infection, such as pneumonia.  Severe bleeding (hemorrhage).  Allergic reactions to medicines.  Damage to organs or structures such as nerves or blood vessels. What happens before the  procedure? Medicines  Ask your health care provider about: ? Changing or stopping your regular medicines. This is especially important if you are taking diabetes medicines or blood thinners. ? Taking medicines such as aspirin and ibuprofen. These medicines can thin your blood. Do not take these medicines unless your health care provider tells you to take them. ? Taking over-the-counter medicines, vitamins, herbs, and supplements.  Before you go into the operating room, you may be given antibiotic medicine to help prevent infection.  Talk with your health care provider about safe and effective ways to manage pain before and after your procedure. Pain management should fit your specific health needs. Staying hydrated Follow instructions from your health care provider about hydration, which may include:  Up to 2 hours before the procedure - you may continue to drink clear liquids, such as water, clear fruit juice, black coffee, and plain tea. Eating and drinking restrictions Follow instructions from your health care provider about eating and drinking, which may include:  8 hours before the procedure - stop eating heavy meals or foods such as meat, fried foods, or fatty foods.  6 hours before the procedure - stop eating light meals or foods, such as toast or cereal.  6 hours before the procedure - stop drinking milk or drinks that contain milk.  2 hours before the procedure - stop drinking clear liquids. General instructions  Ask your health care provider how your surgical site will be marked or identified.  You may have tests, such as: ? CT scan. ? Ultrasound. ? Chest X-ray. ? Electrocardiogram (ECG).  You may have a blood or urine  sample taken.  Do not use any products that contain nicotine or tobacco, such as cigarettes and e-cigarettes. If you need help quitting, ask your health care provider.  You may be asked to shower with a germ-killing soap.  Plan to have someone take  you home from the hospital or clinic.  Plan to have a responsible adult care for you for at least 24 hours after you leave the hospital or clinic. This is important. What happens during the procedure?  To lower your risk of infection: ? Your health care team will wash or sanitize their hands. ? Hair may be removed from the surgical area. ? Your skin will be washed with soap.  An IV will be inserted into one of your veins.  You will be given one or more of the following: ? A medicine to help you relax (sedative). ? A medicine to make you fall asleep (general anesthetic). ? A medicine that is injected into an area of your body to numb everything below the injection site (regional anesthetic).  One to four small incisions will be made. The number of incisions and the area where incisions are made will depend on the purpose of your procedure.  Necessary procedures will be performed using the robotic system.  A drainage tube may be placed to drain excess fluid from the surgical area.  Your incisions will be closed with stitches (sutures) or staples and covered with a bandage (dressing). The procedure may vary among health care providers and hospitals. What happens after the procedure?  Your blood pressure, heart rate, breathing rate, and blood oxygen level will be monitored until the medicines you were given have worn off.  You may have a drainage tube in place. It may stay in place for a few days after the procedure to monitor for signs of air or fluid buildup in your lungs.  You may have to wear compression stockings. These stockings help to prevent blood clots and reduce swelling in your legs. Summary  Robot-assisted thoracic surgery is a procedure in which robotic arms and surgical instruments are used to help perform complex procedures through small incisions. During surgery, the surgeon sits at a console in the operating room and uses controls at the console to move the robotic  arms.  Compared to surgery between the neck and abdomen that is done by hand (traditional thoracic surgery), robot-assisted surgery allows the surgeon to perform complex procedures more easily.  A drainage tube may be placed to drain excess fluid from the surgical area. The tube will be closely monitored for fluid or air buildup in your lungs. This information is not intended to replace advice given to you by your health care provider. Make sure you discuss any questions you have with your health care provider. Document Revised: 12/28/2018 Document Reviewed: 07/04/2016 Elsevier Patient Education  [employer].

## 2019-12-13 NOTE — Transfer of Care (Signed)
Immediate Anesthesia Transfer of Care Note  Patient: Susan Hunt  Procedure(s) Performed: XI ROBOTIC ASSISTED THORASCOPY (Right ) LUNG BIOPSY (Right ) LYMPH NODE BIOPSY (Right ) VIDEO BRONCHOSCOPY (N/A ) INTERCOSTAL NERVE BLOCK (Right Chest)  Patient Location: PACU  Anesthesia Type:General  Level of Consciousness: awake, alert , drowsy and patient cooperative  Airway & Oxygen Therapy: Patient Spontanous Breathing and Patient connected to face mask oxygen  Post-op Assessment: Report given to RN, Post -op Vital signs reviewed and stable and Patient moving all extremities X 4  Post vital signs: Reviewed and stable  Last Vitals:  Vitals Value Taken Time  BP    Temp    Pulse    Resp    SpO2      Last Pain:  Vitals:   12/13/19 0613  TempSrc:   PainSc: 0-No pain         Complications: No complications documented.

## 2019-12-13 NOTE — Brief Op Note (Addendum)
12/13/2019  10:21 AM  PATIENT:  Susan Hunt  74 y.o. female  PRE-OPERATIVE DIAGNOSIS:  INTERSTITIAL LUNG DISEASE  POST-OPERATIVE DIAGNOSIS:  INTERSTITIAL LUNG DISEASE  PROCEDURE:  BRONCHOSCOPY WITH BAL Xi ROBOTIC-ASSISTED RIGHT VATS LUNG BIOPSY INTERCOSTAL NERVE BLOCKS- LEVEL 3-10  SURGEON:  Surgeon(s) and Role:    * Melrose Nakayama, MD - Primary  PHYSICIAN ASSISTANT: Nicholes Rough, PA-C  ANESTHESIA:   general  EBL:  10 mL   BLOOD ADMINISTERED:none  DRAINS: ONE BLAKE DRAIN   LOCAL MEDICATIONS USED:  BUPIVICAINE   SPECIMEN:  UPPER, MIDDLE, AND LOWER LOBE BIOPSIES  DISPOSITION OF SPECIMEN:  PATHOLOGY  COUNTS:  YES  DICTATION: .-  PLAN OF CARE: Admit to inpatient   PATIENT DISPOSITION:  PACU - hemodynamically stable.   Delay start of Pharmacological VTE agent (>24hrs) due to surgical blood loss or risk of bleeding: no

## 2019-12-13 NOTE — Discharge Summary (Addendum)
[address]       [place] [address]             [phone]       Physician Discharge Summary  Patient ID: Susan Hunt MRN: [id number] DOB/AGE: Dec 16, 1945 74 y.o.  Admit date: 12/13/2019 Discharge date: 12/16/2019  Admission Diagnoses: Patient Active Problem List   Diagnosis Date Noted  . Interstitial lung disease ([facility]) 11/01/2019    Discharge Diagnoses:  Interstitial lung disease Active Problems:   S/P robot-assisted surgical procedure   Discharged Condition: good  HPI:   Susan Hunt is a 74 year old woman with history of hypertension, hyperlipidemia, asthma, obesity, hypothyroidism, depression, nonischemic cardiomyopathy, interstitial lung disease.  She has been having shortness of breath dating back about a year.  She had been started on nitrofurantoin for recurrent UTI.  Stopping that medication did not improve her symptoms.  Initially she thought it might be COPD but treatment for that was not helpful.  She was referred to Dr. Chase Caller.  CT of the chest showed interstitial lung disease with some pulmonary fibrosis.  The findings were nonspecific.  I saw her in July and we were considering lung biopsy at that time.  She was found to have a reduced ejection fraction on echocardiogram.  She was referred to Dr. Margaretann Loveless.  Cardiac catheterization showed no significant CAD.  Her EF was about 30 to 35%.  She was started on Entresto and has had significant improvement in her symptoms since then.  She recently had a follow-up CT which showed persistent interstitial changes.    Hospital Course:   [name] underwent a robotic assist lung biopsy with Dr. Roxan Hockey. She tolerated the procedure well and was transferred to the cardiac telemetry unit in stable condition.   Addendum:  Ms. Palomino progressed well post operatively.  She developed a mild elevation in her creatinine felt to be related to Toradol usage.  This was therefore  discontinued.  Her chest tube was free from air leak and her CXR showed no evidence of pneumothorax.  Her chest tube was removed on 12/14/2019.  Follow up CXR showed no development of pneumothorax, but bilateral atelectasis.  The patient required aggressive pulmonary toilet with IS and deep breathing to be stable off oxygen.  Her surgical incisions are healing without evidence of infection.  She is ambulating without significant difficulty.  She is medically stable for discharge home today.  Treatments: surgery:   1.  Bronchoscopy with bronchoalveolar lavage. 2.  Xi robotic-assisted right thoracoscopy. 3.  Lung biopsy.   4.  Intercostal nerve blocks at levels 3 through 10.  Discharge Exam: Blood pressure (!) 120/46, pulse 91, temperature 98.3 F (36.8 C), temperature source Oral, resp. rate 18, height 5' 7" (1.702 m), weight 92.3 kg, SpO2 93 %.   General appearance: alert, cooperative and no distress Neurologic: intact Heart: regular rate and rhythm and occasional PVCs. Lungs: breath sounds clear, shallow.  Wound: Expected brusing adjacent to port sites on right chest.   Discharge Medications: Discharge Instructions    For home use only DME oxygen   Complete by: As directed    Length of Need: 6 Months   Mode or (Route): Nasal cannula   Liters per Minute: 2   Frequency: Continuous (stationary and portable oxygen unit needed)   Oxygen conserving device: Yes   Oxygen delivery system: Gas     Allergies as of 12/16/2019      Reactions   Ace Inhibitors Cough  Clarithromycin    REACTION: GI sx   Codeine Nausea Only   Telmisartan Other (See Comments)   sleepy      Medication List    TAKE these medications   acetaminophen 500 MG tablet Commonly known as: TYLENOL Take 2 tablets (1,000 mg total) by mouth every 6 (six) hours as needed.   cephALEXin 250 MG capsule Commonly known as: KEFLEX Take 250 mg by mouth at bedtime.   cetirizine 10 MG tablet Commonly known as:  ZYRTEC Take 10 mg by mouth daily.   diphenhydrAMINE 25 mg capsule Commonly known as: BENADRYL Take 25 mg by mouth at bedtime.   Entresto 49-51 MG Generic drug: sacubitril-valsartan Take 1 tablet by mouth 2 (two) times daily.   Farxiga 10 MG Tabs tablet Generic drug: dapagliflozin propanediol Take 10 mg by mouth daily. What changed: Another medication with the same name was removed. Continue taking this medication, and follow the directions you see here.   fluticasone 50 MCG/ACT nasal spray Commonly known as: FLONASE Place 2 sprays into both nostrils at bedtime as needed (allergies.).   guaiFENesin 600 MG 12 hr tablet Commonly known as: MUCINEX Take 1 tablet (600 mg total) by mouth 2 (two) times daily for 7 days.   levothyroxine 125 MCG tablet Commonly known as: SYNTHROID Take 1 tablet (125 mcg total) by mouth daily.   metoprolol succinate 25 MG 24 hr tablet Commonly known as: Toprol XL Take 1 tablet (25 mg total) by mouth daily.   montelukast 10 MG tablet Commonly known as: SINGULAIR TAKE ONE TABLET BY MOUTH EVERY NIGHT AT BEDTIME   multivitamin with minerals Tabs tablet Take 1 tablet by mouth daily.   omeprazole 20 MG capsule Commonly known as: PRILOSEC TAKE ONE CAPSULE BY MOUTH DAILY   oxyCODONE 5 MG immediate release tablet Commonly known as: Oxy IR/ROXICODONE Take 1 tablet (5 mg total) by mouth every 4 (four) hours as needed for severe pain.   rosuvastatin 10 MG tablet Commonly known as: CRESTOR Take 1 tablet (10 mg total) by mouth daily.   venlafaxine XR 150 MG 24 hr capsule Commonly known as: EFFEXOR-XR Take 1 capsule (150 mg total) by mouth daily.            Durable Medical Equipment  (From admission, onward)         Start     Ordered   12/16/19 0000  For home use only DME oxygen       Question Answer Comment  Length of Need 6 Months   Mode or (Route) Nasal cannula   Liters per Minute 2   Frequency Continuous (stationary and portable  oxygen unit needed)   Oxygen conserving device Yes   Oxygen delivery system Gas      12/16/19 1333          Follow-up Information    Melrose Nakayama, MD Follow up on 12/31/2019.   Specialty: Cardiothoracic Surgery Why: Appointment is at 9:45, please get CXR at 9:15 at [facility] located on first floor of our office building Contact information: [place] [address] [phone]        Brand Males, MD. Go on 12/27/2019.   Specialty: Pulmonary Disease Why: Your appointment is at 11:45am. Contact information: [address] [place] [address] [phone]               Signed:  Original note by: Elgie Collard 12/16/2019, 1:34 PM   Addendum by:  Elgie Collard, PA-C

## 2019-12-13 NOTE — Op Note (Signed)
NAME: NICHOLLE, FALZON MEDICAL RECORD OE:[id number] ACCOUNT [id number] DATE OF BIRTH:1946-01-11 FACILITY: [facility] LOCATION: [facility] PHYSICIAN:Jadi Deyarmin Chaya Jan, MD  OPERATIVE REPORT  DATE OF PROCEDURE:  12/13/2019  PREOPERATIVE DIAGNOSIS:  Interstitial lung disease.  POSTOPERATIVE DIAGNOSIS:  Interstitial lung disease.  PROCEDURE:   1.  Bronchoscopy with bronchoalveolar lavage. 2.  Xi robotic-assisted right thoracoscopy. 3.  Lung biopsy x 3.   4.  Intercostal nerve blocks levels 3 through 10.  SURGEON:  Modesto Charon, MD  ASSISTANT:  Nicholes Rough, PA-C  ANESTHESIA:  General.  FINDINGS:  Frozen section revealed scarring and interstitial changes consistent with interstitial lung disease.  Definitive diagnosis deferred to permanent pathology.  CLINICAL NOTE:  Mrs. Pellegrini is a 74 year old woman who presented with shortness of breath.  CT revealed interstitial lung disease with some pulmonary fibrosis, but the findings were nonspecific.  Dr. Chase Caller referred the patient for a lung biopsy to help guide  treatment.  The indications, risks, benefits, and alternatives were discussed in detail with the patient.  She understood and accepted the risks and agreed to proceed.  OPERATIVE NOTE:  Mrs. Preslar was brought to the preoperative holding area on 12/13/2019.  Anesthesia placed an arterial blood pressure monitoring line and established intravenous access.  She was taken to the Operating Room, anesthetized and intubated.   A Foley catheter was placed.  Sequential compression devices were placed on the calves for DVT prophylaxis.  Intravenous antibiotics were administered.  A timeout was performed.  Flexible fiberoptic bronchoscopy was performed via the endotracheal tube.  It revealed normal endobronchial anatomy.  There were some thick, clear secretions.  There were no endobronchial lesions.  Bronchoalveolar lavage was  performed from the right upper and right lower lobes.   One hundred mL of saline was instilled in both and approximately 40 mL returned in total.  This was sent for cell count with differential and cultures.  She was reintubated with a double lumen endotracheal tube.  She was placed in a left lateral decubitus position and the right chest was prepped and draped in the usual sterile fashion.  Single-lung ventilation of the left lung was initiated and  was tolerated well throughout the procedure.  A second timeout was performed.  A solution containing 20 mL of liposomal bupivacaine, 30 mL of 0.5% bupivacaine and 50 mL of saline was prepared.  This was used for local at the skin incisions, as well as  for the intercostal nerve blocks.  An incision was made in the 8th interspace in the midaxillary line.  An 8 mm port was inserted.  The thoracoscope was advanced into the chest.  After confirming intrapleural placement, carbon dioxide was insufflated per  protocol.  There were some adhesions of the upper lobe to the chest wall.  A 12 mm port was placed more anteriorly in the 8th interspace and an AirSeal assistant port was placed in the 10th interspace centered between the 2 anterior ports.  Intercostal  nerve blocks then were performed from the 3rd to the 10th interspace.  Ten mL of the bupivacaine solution was injected into a subpleural plane at each level.  Two additional robotic ports were placed in the 8th interspace posteriorly.  The robot then was  brought to the table, deployed and the camera was docked.  The thoracoscope was inserted.  Targeting was performed.  The remaining ports were docked and the instruments were inserted with thoracoscopic visualization.  The first biopsy was taken from the inferoposterior aspect of the lower lobe.  This area was relatively heavily involved by CT.  The lung had a mildly nodular appearance to it.  A lung biopsy was performed with sequential firings of the robotic stapler  using blue and green cartridges.  The specimen  was placed into an endoscopic retrieval bag and removed.  A small piece was sent for AFB and fungal cultures.  The remainder was sent for pathology, including a frozen section to ensure there was lesional  tissue.  Frozen section did in fact confirm that.  While awaiting the results of the frozen section, biopsies were obtained from the anterior portion of the middle lobe and the inferior margin of the upper lobe.  These specimens were both sent for  permanent pathology.  There was good hemostasis at all of the staple lines.  There was some mild bleeding around the robotic ports, but that stopped before the completion of the procedure.  The chest was copiously irrigated with saline.  The robot was  undocked.  There was a single remaining sponge in the chest that was visualized and was removed through the assistant port.  A 28-French Blake drain was placed through the anterior 8th interspace port and directed to the apex.  It was secured with a  #1 silk suture.  Dual-lung ventilation was resumed.  The thoracoscope was removed.  The remaining port was removed.  The remaining incisions were closed in standard fashion.  Dermabond was applied.  The chest tube was placed to Pleur-evac on waterseal.   The patient then was taken from the operating room to the [facility], extubated, and in good condition.  VN/NUANCE  D:12/13/2019 T:12/13/2019 JOB:[id number]

## 2019-12-13 NOTE — Anesthesia Procedure Notes (Signed)
Arterial Line Insertion Start/End10/03/2019 6:50 AM, 12/13/2019 7:10 AM Performed by: Gaylene Brooks, CRNA  Preanesthetic checklist: patient identified, IV checked, site marked, risks and benefits discussed, surgical consent, monitors and equipment checked, pre-op evaluation, timeout performed and anesthesia consent Patient sedated Left, radial was placed Catheter size: 20 G Hand hygiene performed  and maximum sterile barriers used   Attempts: 1 Procedure performed without using ultrasound guided technique. Following insertion, Biopatch and dressing applied. Post procedure assessment: normal  Additional procedure comments: Performed by Meyer Russel.

## 2019-12-14 ENCOUNTER — Encounter (HOSPITAL_COMMUNITY): Payer: Self-pay | Admitting: Thoracic Surgery (Cardiothoracic Vascular Surgery)

## 2019-12-14 ENCOUNTER — Inpatient Hospital Stay (HOSPITAL_COMMUNITY): Payer: Medicare Other

## 2019-12-14 LAB — BASIC METABOLIC PANEL
Anion gap: 14 (ref 5–15)
BUN: 17 mg/dL (ref 8–23)
CO2: 22 mmol/L (ref 22–32)
Calcium: 8.7 mg/dL — ABNORMAL LOW (ref 8.9–10.3)
Chloride: 100 mmol/L (ref 98–111)
Creatinine, Ser: 1.22 mg/dL — ABNORMAL HIGH (ref 0.44–1.00)
GFR calc Af Amer: 50 mL/min — ABNORMAL LOW (ref >60)
GFR calc non Af Amer: 43 mL/min — ABNORMAL LOW (ref >60)
Glucose, Bld: 162 mg/dL — ABNORMAL HIGH (ref 70–99)
Potassium: 3.6 mmol/L (ref 3.5–5.1)
Sodium: 136 mmol/L (ref 135–145)

## 2019-12-14 LAB — CBC
HCT: 37.5 % (ref 36.0–46.0)
Hemoglobin: 11.7 g/dL — ABNORMAL LOW (ref 12.0–15.0)
MCH: 27.5 pg (ref 26.0–34.0)
MCHC: 31.2 g/dL (ref 30.0–36.0)
MCV: 88 fL (ref 80.0–100.0)
Platelets: 264 10*3/uL (ref 150–400)
RBC: 4.26 MIL/uL (ref 3.87–5.11)
RDW: 14.8 % (ref 11.5–15.5)
WBC: 17 10*3/uL — ABNORMAL HIGH (ref 4.0–10.5)
nRBC: 0 % (ref 0.0–0.2)

## 2019-12-14 LAB — GLUCOSE, CAPILLARY
Glucose-Capillary: 177 mg/dL — ABNORMAL HIGH (ref 70–99)
Glucose-Capillary: 164 mg/dL — ABNORMAL HIGH (ref 70–99)
Glucose-Capillary: 138 mg/dL — ABNORMAL HIGH (ref 70–99)
Glucose-Capillary: 138 mg/dL — ABNORMAL HIGH (ref 70–99)

## 2019-12-14 MED ORDER — SACUBITRIL-VALSARTAN 49-51 MG PO TABS
1.0000 | ORAL_TABLET | Freq: Two times a day (BID) | ORAL | Status: DC
  Administered 2019-12-14 – 2019-12-16 (×4): 1 via ORAL
  Filled 2019-12-14 (×5): qty 1

## 2019-12-14 MED ORDER — METOPROLOL SUCCINATE ER 25 MG PO TB24
25.0000 mg | ORAL_TABLET | Freq: Every day | ORAL | Status: DC
  Administered 2019-12-14 – 2019-12-16 (×2): 25 mg via ORAL
  Filled 2019-12-14 (×3): qty 1

## 2019-12-14 MED ORDER — OXYCODONE HCL 5 MG PO TABS
5.0000 mg | ORAL_TABLET | ORAL | Status: DC | PRN
  Administered 2019-12-14 – 2019-12-16 (×10): 10 mg via ORAL
  Filled 2019-12-14 (×10): qty 2

## 2019-12-14 MED ORDER — OXYCODONE HCL 5 MG PO TABS
5.0000 mg | ORAL_TABLET | ORAL | Status: DC | PRN
  Administered 2019-12-14: 5 mg via ORAL
  Filled 2019-12-14: qty 1

## 2019-12-14 MED ORDER — DAPAGLIFLOZIN PROPANEDIOL 10 MG PO TABS
10.0000 mg | ORAL_TABLET | Freq: Every day | ORAL | Status: DC
  Administered 2019-12-14 – 2019-12-16 (×3): 10 mg via ORAL
  Filled 2019-12-14 (×3): qty 1

## 2019-12-14 MED ORDER — INSULIN ASPART 100 UNIT/ML ~~LOC~~ SOLN
0.0000 [IU] | Freq: Three times a day (TID) | SUBCUTANEOUS | Status: DC
  Administered 2019-12-14: 2 [IU] via SUBCUTANEOUS
  Administered 2019-12-14: 1 [IU] via SUBCUTANEOUS

## 2019-12-14 NOTE — Progress Notes (Addendum)
[address]       [place] [address]             [phone]      1 Day Post-Op Procedure(s) (LRB): XI ROBOTIC ASSISTED THORASCOPY (Right) LUNG BIOPSY (Right) LYMPH NODE BIOPSY (Right) VIDEO BRONCHOSCOPY (N/A) INTERCOSTAL NERVE BLOCK (Right)   Subjective:  Patient complains of pain along her right neck, clavicle area.  She states its really uncomfortable.  She would like her neck line out.  She denies N/V  Objective: Vital signs in last 24 hours: Temp:  [97 F (36.1 C)-98.6 F (37 C)] 98.6 F (37 C) (10/02 0744) Pulse Rate:  [78-88] 78 (10/02 0744) Cardiac Rhythm: Normal sinus rhythm (10/02 0700) Resp:  [13-22] 20 (10/02 0744) BP: (94-151)/(49-89) 119/58 (10/02 0744) SpO2:  [92 %-100 %] 93 % (10/02 0744) Arterial Line BP: (160-196)/(69-94) 164/94 (10/01 1200)  Intake/Output from previous day: 10/01 0701 - 10/02 0700 In: 1660 [P.O.:360; I.V.:1200] Out: 1517 [Urine:1330; Blood:100; Chest Tube:87]  General appearance: alert, cooperative and no distress Heart: regular rate and rhythm Lungs: clear to auscultation bilaterally Abdomen: soft, non-tender; bowel sounds normal; no masses,  no organomegaly Extremities: extremities normal, atraumatic, no cyanosis or edema Wound: clean and dry  Lab Results: Recent Labs    12/11/19 0915 12/14/19 0201  WBC 8.8 17.0*  HGB 13.0 11.7*  HCT 42.9 37.5  PLT 306 264   BMET:  Recent Labs    12/11/19 0915 12/14/19 0201  NA 140 136  K 3.8 3.6  CL 107 100  CO2 20* 22  GLUCOSE 137* 162*  BUN 11 17  CREATININE 1.08* 1.22*  CALCIUM 9.1 8.7*    PT/INR:  Recent Labs    12/11/19 0915  LABPROT 13.6  INR 1.1   ABG    Component Value Date/Time   PHART 7.418 12/11/2019 0920   HCO3 23.8 12/11/2019 0920   TCO2 28 11/01/2019 1016   ACIDBASEDEF 0.1 12/11/2019 0920   O2SAT 96.7 12/11/2019 0920   CBG (last 3)  Recent Labs    12/13/19 1604 12/13/19 2208 12/14/19 0634  GLUCAP 176* 242* 177*     Assessment/Plan: S/P Procedure(s) (LRB): XI ROBOTIC ASSISTED THORASCOPY (Right) LUNG BIOPSY (Right) LYMPH NODE BIOPSY (Right) VIDEO BRONCHOSCOPY (N/A) INTERCOSTAL NERVE BLOCK (Right)  1. CV- hemodynamically stable 2. Pulm- CT on water seal, no air leak present, minimal output.Marland Kitchen CXR with bilateral atelectasis, ? Small apical pneumothorax 3. Renal- creatinine up to 1.22, suspect from Toradol dosing will d/c, repeat in AM 4. Pain control- patient states Tramadol does nothing, will try low dose Oxycodone 5. Hypothyroidism on synthroid 6. DM- diet controlled, continue SSIP 7. D/C IV fluid, foley catheter, central line 8. Lovenox for DVT prophylaxis   LOS: 1 day   Ellwood Handler, PA-C 12/14/2019 Patient seen and examined, agree with above No air leak- dc chest tube Dc tramadol, use oxycodone for pain  Remo Lipps C. Roxan Hockey, MD [facility] [phone]

## 2019-12-14 NOTE — Progress Notes (Signed)
300 cc on bladder scan

## 2019-12-15 ENCOUNTER — Inpatient Hospital Stay (HOSPITAL_COMMUNITY): Payer: Medicare Other

## 2019-12-15 LAB — COMPREHENSIVE METABOLIC PANEL
ALT: 30 U/L (ref 0–44)
AST: 41 U/L (ref 15–41)
Albumin: 3 g/dL — ABNORMAL LOW (ref 3.5–5.0)
Alkaline Phosphatase: 59 U/L (ref 38–126)
Anion gap: 7 (ref 5–15)
BUN: 19 mg/dL (ref 8–23)
CO2: 27 mmol/L (ref 22–32)
Calcium: 8.8 mg/dL — ABNORMAL LOW (ref 8.9–10.3)
Chloride: 103 mmol/L (ref 98–111)
Creatinine, Ser: 1.29 mg/dL — ABNORMAL HIGH (ref 0.44–1.00)
GFR calc Af Amer: 47 mL/min — ABNORMAL LOW (ref >60)
GFR calc non Af Amer: 40 mL/min — ABNORMAL LOW (ref >60)
Glucose, Bld: 124 mg/dL — ABNORMAL HIGH (ref 70–99)
Potassium: 3.8 mmol/L (ref 3.5–5.1)
Sodium: 137 mmol/L (ref 135–145)
Total Bilirubin: 1 mg/dL (ref 0.3–1.2)
Total Protein: 5.9 g/dL — ABNORMAL LOW (ref 6.5–8.1)

## 2019-12-15 LAB — CBC
HCT: 37 % (ref 36.0–46.0)
Hemoglobin: 10.9 g/dL — ABNORMAL LOW (ref 12.0–15.0)
MCH: 26.5 pg (ref 26.0–34.0)
MCHC: 29.5 g/dL — ABNORMAL LOW (ref 30.0–36.0)
MCV: 90 fL (ref 80.0–100.0)
Platelets: 251 10*3/uL (ref 150–400)
RBC: 4.11 MIL/uL (ref 3.87–5.11)
RDW: 15.1 % (ref 11.5–15.5)
WBC: 20.2 10*3/uL — ABNORMAL HIGH (ref 4.0–10.5)
nRBC: 0 % (ref 0.0–0.2)

## 2019-12-15 LAB — GLUCOSE, CAPILLARY
Glucose-Capillary: 108 mg/dL — ABNORMAL HIGH (ref 70–99)
Glucose-Capillary: 118 mg/dL — ABNORMAL HIGH (ref 70–99)
Glucose-Capillary: 126 mg/dL — ABNORMAL HIGH (ref 70–99)
Glucose-Capillary: 104 mg/dL — ABNORMAL HIGH (ref 70–99)

## 2019-12-15 LAB — CULTURE, BAL-QUANTITATIVE W GRAM STAIN: Culture: 1000 — AB

## 2019-12-15 LAB — ACID FAST SMEAR (AFB, MYCOBACTERIA): Acid Fast Smear: NEGATIVE

## 2019-12-15 MED ORDER — ACETAMINOPHEN 500 MG PO TABS
1000.0000 mg | ORAL_TABLET | Freq: Four times a day (QID) | ORAL | 0 refills | Status: DC | PRN
Start: 2019-12-15 — End: 2020-12-11

## 2019-12-15 MED ORDER — FUROSEMIDE 10 MG/ML IJ SOLN
40.0000 mg | Freq: Once | INTRAMUSCULAR | Status: AC
  Administered 2019-12-15: 40 mg via INTRAVENOUS
  Filled 2019-12-15: qty 4

## 2019-12-15 MED ORDER — CHLORHEXIDINE GLUCONATE CLOTH 2 % EX PADS
6.0000 | MEDICATED_PAD | Freq: Every day | CUTANEOUS | Status: DC
  Administered 2019-12-15: 6 via TOPICAL

## 2019-12-15 MED ORDER — OXYCODONE HCL 5 MG PO TABS
5.0000 mg | ORAL_TABLET | ORAL | 0 refills | Status: DC | PRN
Start: 2019-12-15 — End: 2020-04-02

## 2019-12-15 NOTE — Progress Notes (Addendum)
[address]       [place] [address]             [phone]      2 Days Post-Op Procedure(s) (LRB): XI ROBOTIC ASSISTED THORASCOPY (Right) LUNG BIOPSY (Right) LYMPH NODE BIOPSY (Right) VIDEO BRONCHOSCOPY (N/A) INTERCOSTAL NERVE BLOCK (Right)   Subjective:  Patient doing better today.  Oxycodone helped her pain level.  She was hoping to go home today.  Objective: Vital signs in last 24 hours: Temp:  [98.1 F (36.7 C)-98.6 F (37 C)] 98.1 F (36.7 C) (10/02 2118) Pulse Rate:  [78-88] 80 (10/02 2118) Cardiac Rhythm: Normal sinus rhythm (10/02 1900) Resp:  [17-20] 17 (10/02 2118) BP: (91-119)/(37-58) 93/37 (10/02 2118) SpO2:  [90 %-95 %] 93 % (10/02 2118)  Intake/Output from previous day: 10/02 0701 - 10/03 0700 In: 440 [P.O.:440] Out: 950 [Urine:950]  General appearance: alert, cooperative and no distress Heart: regular rate and rhythm Lungs: clear to auscultation bilaterally Abdomen: soft, non-tender; bowel sounds normal; no masses,  no organomegaly Extremities: edema trace Wound: clean and dry  Lab Results: Recent Labs    12/14/19 0201 12/15/19 0201  WBC 17.0* 20.2*  HGB 11.7* 10.9*  HCT 37.5 37.0  PLT 264 251   BMET:  Recent Labs    12/14/19 0201 12/15/19 0201  NA 136 137  K 3.6 3.8  CL 100 103  CO2 22 27  GLUCOSE 162* 124*  BUN 17 19  CREATININE 1.22* 1.29*  CALCIUM 8.7* 8.8*    PT/INR: No results for input(s): LABPROT, INR in the last 72 hours. ABG    Component Value Date/Time   PHART 7.418 12/11/2019 0920   HCO3 23.8 12/11/2019 0920   TCO2 28 11/01/2019 1016   ACIDBASEDEF 0.1 12/11/2019 0920   O2SAT 96.7 12/11/2019 0920   CBG (last 3)  Recent Labs    12/14/19 1643 12/14/19 2128 12/15/19 0654  GLUCAP 138* 138* 108*    Assessment/Plan: S/P Procedure(s) (LRB): XI ROBOTIC ASSISTED THORASCOPY (Right) LUNG BIOPSY (Right) LYMPH NODE BIOPSY (Right) VIDEO BRONCHOSCOPY (N/A) INTERCOSTAL NERVE BLOCK  (Right)  1. CV- NSR, BP controlled- on home Entresto, Toprol XL 2. Pulm- CT out yesterday, CXR is free from pneumothorax, + atelectasis, will order IS as patient didn't receive one... patient not off oxygen, sats are in the low 80s on RA... continue to wean as tolerated 3. Renal- creatinine stable 4. DM- sugars controlled on home Farxiga 5. Lovenox for DVT prophylaxis 6. Dispo- patient stable, no pneumothorax post chest tube removal, patient wishes to go home, however she needs more aggressive pulmonary toilet, will keep in hospital today, wean oxygen, and if stable off oxygen tomorrow can d/c home   LOS: 2 days   Ellwood Handler, PA-C 12/15/2019 Patient seen and examined, agree with A/p as outlined above  Remo Lipps C. Roxan Hockey, MD [facility] [phone]

## 2019-12-16 ENCOUNTER — Ambulatory Visit: Payer: Medicare Other | Admitting: Internal Medicine

## 2019-12-16 LAB — CBC
HCT: 35.9 % — ABNORMAL LOW (ref 36.0–46.0)
Hemoglobin: 11 g/dL — ABNORMAL LOW (ref 12.0–15.0)
MCH: 27.2 pg (ref 26.0–34.0)
MCHC: 30.6 g/dL (ref 30.0–36.0)
MCV: 88.9 fL (ref 80.0–100.0)
Platelets: 265 10*3/uL (ref 150–400)
RBC: 4.04 MIL/uL (ref 3.87–5.11)
RDW: 14.9 % (ref 11.5–15.5)
WBC: 15.3 10*3/uL — ABNORMAL HIGH (ref 4.0–10.5)
nRBC: 0 % (ref 0.0–0.2)

## 2019-12-16 LAB — GLUCOSE, CAPILLARY
Glucose-Capillary: 146 mg/dL — ABNORMAL HIGH (ref 70–99)
Glucose-Capillary: 100 mg/dL — ABNORMAL HIGH (ref 70–99)

## 2019-12-16 MED ORDER — GUAIFENESIN ER 600 MG PO TB12: 600.0000 mg | ORAL_TABLET | Freq: Two times a day (BID) | ORAL | 0 refills | Status: AC

## 2019-12-16 MED ORDER — GUAIFENESIN ER 600 MG PO TB12: 600.0000 mg | ORAL_TABLET | Freq: Two times a day (BID) | ORAL | Status: DC

## 2019-12-16 NOTE — Progress Notes (Signed)
[address]       [place] [address]             [phone]      3 Days Post-Op Procedure(s) (LRB): XI ROBOTIC ASSISTED THORASCOPY (Right) LUNG BIOPSY (Right) LYMPH NODE BIOPSY (Right) VIDEO BRONCHOSCOPY (N/A) INTERCOSTAL NERVE BLOCK (Right) Subjective: Awake and alert, pain controlled.  Says she is able to get in and out of bed by herself. Tolerating PO's and is having appropriate bowel and bladder function.  She remains on O2 at 2Lnc with O2 sat 98%.  She wants to go home today.   Objective: Vital signs in last 24 hours: Temp:  [97.9 F (36.6 C)-99.2 F (37.3 C)] 98.1 F (36.7 C) (10/04 0821) Pulse Rate:  [86-99] 86 (10/04 0821) Cardiac Rhythm: Normal sinus rhythm (10/04 0821) Resp:  [18-20] 18 (10/04 0821) BP: (108-141)/(49-92) 118/51 (10/04 0821) SpO2:  [90 %-96 %] 96 % (10/04 0821)     Intake/Output from previous day: No intake/output data recorded. Intake/Output this shift: No intake/output data recorded.  General appearance: alert, cooperative and no distress Neurologic: intact Heart: regular rate and rhythm and occasional PVCs. Lungs: breath sounds clear, shallow.  Wound: Expected brusing adjacent to port sites on right chest.   Lab Results: Recent Labs    12/14/19 0201 12/15/19 0201  WBC 17.0* 20.2*  HGB 11.7* 10.9*  HCT 37.5 37.0  PLT 264 251   BMET:  Recent Labs    12/14/19 0201 12/15/19 0201  NA 136 137  K 3.6 3.8  CL 100 103  CO2 22 27  GLUCOSE 162* 124*  BUN 17 19  CREATININE 1.22* 1.29*  CALCIUM 8.7* 8.8*    PT/INR: No results for input(s): LABPROT, INR in the last 72 hours. ABG    Component Value Date/Time   PHART 7.418 12/11/2019 0920   HCO3 23.8 12/11/2019 0920   TCO2 28 11/01/2019 1016   ACIDBASEDEF 0.1 12/11/2019 0920   O2SAT 96.7 12/11/2019 0920   CBG (last 3)  Recent Labs    12/15/19 1116 12/15/19 1632 12/15/19 2121  GLUCAP 118* 126* 104*    Assessment/Plan: S/P Procedure(s)  (LRB): XI ROBOTIC ASSISTED THORASCOPY (Right) LUNG BIOPSY (Right) LYMPH NODE BIOPSY (Right) VIDEO BRONCHOSCOPY (N/A) INTERCOSTAL NERVE BLOCK (Right)  -POD-3 robotic assisted right lung Bx for ILD in 74yo with COPD and history of heart failure.  Path pending. Maintaining adequate O2 saturations on 2Lnc.  Suspect respiratory status is near pre-admission baseline.  CXR (10/3) is stable.   WBC 20K yesterday, not febrile. Will recheck now.  Considering discharge later today on home O2. Will discuss with Dr. Roxan Hockey.     LOS: 3 days    Antony Odea, PA-C [phone] 12/16/2019

## 2019-12-16 NOTE — Telephone Encounter (Signed)
Triage  2nd message for the day 12/16/2019 - per chart review she is still oin hospital. Pls give followup in BRL 12/27/19. Is best option to give time for results to come back and for her to heal from surgery. If results come sooner then I can let her know sooner  Thanks  MR

## 2019-12-16 NOTE — Telephone Encounter (Signed)
Please let her know I am tracking her progress via chart. Biopsy resport not back yet. Best time/date would be 12/27/19 Friday in BRL   Patient lives in [place] [address] but if she can come there or we can do video vsiit to discuss results - will need coordination with Margie - 13.30 or 14.00h

## 2019-12-16 NOTE — TOC Transition Note (Signed)
Transition of Care (TOC) - CM/SW Discharge Note Marvetta Gibbons RN, BSN Transitions of Care Unit 4E- RN Case Manager See Treatment Team for direct phone #    Patient Details  Name: Susan Hunt MRN: [id number] Date of Birth: 1946-02-02  Transition of Care [facility]) CM/SW Contact:  Dawayne Patricia, RN Phone Number: 12/16/2019, 4:52 PM   Clinical Narrative:    Pt stable for transition home, order for Home 02 has been placed- per pt she has used [employer] in the past and does not wish to use them again, choice offered on another DME provider- pt has selected [facility] for home 02- pt requesting POC for home- will ask PA to modify order for POC need.  Call made to [facility] with [employer] for home 02 needs- portable POC to be delivered to room prior to discharge - [employer] will speak with pt regarding delivery of home equipment.     Final next level of care: Home/Self Care Barriers to Discharge: No Barriers Identified   Patient Goals and CMS Choice Patient states their goals for this hospitalization and ongoing recovery are:: return home CMS Medicare.gov Compare Post Acute Care list provided to:: Patient Choice offered to / list presented to : Patient  Discharge Placement                Home         Discharge Plan and Services   Discharge Planning Services: CM Consult Post Acute Care Choice: Durable Medical Equipment          DME Arranged: Oxygen DME Agency: [facility] Date DME Agency Contacted: 12/16/19 Time DME Agency Contacted: [id number] Representative spoke with at DME Agency: Caryl Pina HH Arranged: NA [place] Agency: NA        Social Determinants of Health (Gray) Interventions     Readmission Risk Interventions Readmission Risk Prevention Plan 12/16/2019  Transportation Screening Complete  PCP or Specialist Appt within 5-7 Days Complete  Home Care Screening Complete  Medication Review (RN CM) Complete  Some recent data might be hidden

## 2019-12-16 NOTE — Telephone Encounter (Signed)
Spoke with the pt and notified of responses per MR  She verbalized understanding  I have scheduled her for 12/27/19 in [place] per MR request

## 2019-12-16 NOTE — Progress Notes (Signed)
SATURATION QUALIFICATIONS: (This note is used to comply with regulatory documentation for home oxygen)  Patient Saturations on Room Air at Rest = 87%  Patient Saturations on Room Air while Ambulating = 86%  Patient Saturations on 3 Liters of oxygen while Ambulating = 94%  Please briefly explain why patient needs home oxygen: Patient saturations on room air at rest 87% and below.  Placed 2 liters oxygen and sats increased 96%.  Patient oxygen saturations began decreasing below 87 with ambulation.  Increased oxygen to 3 liters with ambulation to maintain saturations greater than 88%.  Patient returned to room and placed on 2 liters of oxygen.

## 2019-12-17 LAB — ANAEROBIC CULTURE

## 2019-12-18 ENCOUNTER — Telehealth: Payer: Self-pay

## 2019-12-18 LAB — AEROBIC/ANAEROBIC CULTURE W GRAM STAIN (SURGICAL/DEEP WOUND)
Culture: NO GROWTH
Gram Stain: NONE SEEN

## 2019-12-18 NOTE — Telephone Encounter (Cosign Needed)
Transition Care Management Follow-up Telephone Call  Date of discharge and from where: 12/16/2019,[facility]  How have you been since you were released from the hospital? Not feeling to well and still having trouble getting up and down   Any questions or concerns? No  Items Reviewed:  Did the pt receive and understand the discharge instructions provided? Yes   Medications obtained and verified? Yes   Any new allergies since your discharge? No   Dietary orders reviewed? Yes  Do you have support at home? Yes   Functional Questionnaire: (I = Independent and D = Dependent) ADLs: I  Bathing/Dressing- I  Meal Prep- I  Eating- I  Maintaining continence- I  Transferring/Ambulation- I  Managing Meds- I Husband assist with care  Follow up appointments reviewed:   PCP Hospital f/u appt confirmed? Pt husband will call today to make hosp follow up appt.Marland Kitchen  [facility] f/u appt confirmed? Yes  Scheduled to see Pulmonologist  on 12/27/19 @ 11:30 a.m .  Are transportation arrangements needed? No   If their condition worsens, is the pt aware to call PCP or go to the Emergency Dept.? Yes  Was the patient provided with contact information for the PCP's office or ED? Yes  Was to pt encouraged to call back with questions or concerns? Yes

## 2019-12-23 LAB — SURGICAL PATHOLOGY

## 2019-12-23 NOTE — Progress Notes (Signed)
Patient ID: Susan Hunt                 DOB: January 24, 1946                      MRN: [id number]     HPI: Susan Hunt is a 74 y.o. femalereferred by Dr. Shanda Bumps HFclinic.PMH is significant for HFrEF (LVEF 35-45% 11/01/19), NICM with normal R and L heart pressures, HTN, HLD, and interstitial lung disease with pulmonary fibrosis.  Last seen in HF clinic on 12/10/19 in which she reported stopping Entresto due to dry facial skin, itchiness, dry eyes, and runny nose. Reports side effects improved after stopping Entresto. However, while on Entresto reported improvement in activity level, breathing, and reduced fatigue. Patient was amendable to restarting Entresto 49/51 mg BID and was instructed to assess if side-effects became intolerable. Of note, patient recently admitted from 10/1 to 10/4 for lung biopsy secondary to progressive SOB with cough with imaging suggestive of ILD with mild pulmonary fibrosis.  Patient presents today in good spirits with her husband. Reports medication adherence with Entresto 49/51 twice daily, Toprol 25 mg daily and Farxiga 10 mg daily. Reports previous symptoms of dry facial skin, itchiness, dry eyes, and runny nose have resolved and has not had any issues with Entresto since restarting medication. Denies headaches, blurred vision, palpitations, dizziness, and LE swelling. Reports pain after recent lung biopsy and is currently taking Tylenol and ibuprofen 1-2 times day for pain relief. Reports pain has been contributing to isolated elevated blood pressure readings at home. Home BP readings range from 120s-140s/70-80s with highs of 161/74 and 151/101; HR 70s. Additionally, report no issues with cost or obtaining medications from pharmacy. Reports weight has been stable - averages between 197-200 lbs.  Current CHF/HTNmeds:  Entresto 49/51 mg BID  Metoprolol succinate 25 mg daily (AM)  Farxiga 10 mg daily (AM)  Previously tried:lisinopril-hctz (SOB,  coughing), metoprolol tartrate (SOB, coughing), clonidine, triamterene-HCTZ, losartan, amlodipine  BP goal:<130/80 mmHg  Family History:none  Social History:2-3 mixed drinks weekly, former smoker (quit 10 years ago)  Diet:Does not add salt to food or cook with salt. Enjoys eating tomato-based foods. Does not eat breakfast. Eats lunch/dinner at 3-4 pm daily. At an egg salad sandwich yesterday. Eats out at restaurants often ([place]. [place]) but avoids fast food.  Exercise:limited due to dyspnea -walksdog (halfway around block)  Home BP readings:  120/63, 134/96, 113/61, 161/74, 143/72, 151/101, 132/80 HR 70s  Wt Readings from Last 3 Encounters:  12/24/19 196 lb (88.9 kg)  12/24/19 195 lb 3.2 oz (88.5 kg)  12/13/19 203 lb 6.4 oz (92.3 kg)   BP Readings from Last 3 Encounters:  12/24/19 138/78  12/24/19 132/80  12/16/19 (!) 119/56   Pulse Readings from Last 3 Encounters:  12/24/19 73  12/24/19 71  12/16/19 80    Renal function: Estimated Creatinine Clearance: 43.1 mL/min (A) (by C-G formula based on SCr of 1.29 mg/dL (H)).  Past Medical History:  Diagnosis Date  . Anemia   . Asthma    "I dont think I have it.  . CHF (congestive heart failure) ([place])   . Depression   . Emphysema of lung ([place])   . GERD (gastroesophageal reflux disease)   . Hemorrhoids   . History of kidney stones    Cystoscopy  . Hyperlipidemia   . Hypertension   . Hypothyroidism   . Obesity   . Pre-diabetes   . Rosacea   .  Tubular adenoma of colon 03/26/2019   Colonoscopy repeat in 3 years    Current Outpatient Medications on File Prior to Visit  Medication Sig Dispense Refill  . acetaminophen (TYLENOL) 500 MG tablet Take 2 tablets (1,000 mg total) by mouth every 6 (six) hours as needed. 30 tablet 0  . cephALEXin (KEFLEX) 250 MG capsule Take 250 mg by mouth at bedtime.     . cetirizine (ZYRTEC) 10 MG tablet Take 10 mg by mouth daily.    . diphenhydrAMINE (BENADRYL) 25 mg capsule  Take 25 mg by mouth at bedtime.    Marland Kitchen FARXIGA 10 MG TABS tablet Take 10 mg by mouth daily.    . fluticasone (FLONASE) 50 MCG/ACT nasal spray Place 2 sprays into both nostrils at bedtime as needed (allergies.).     Marland Kitchen levothyroxine (SYNTHROID) 125 MCG tablet Take 1 tablet (125 mcg total) by mouth daily. 90 tablet 0  . montelukast (SINGULAIR) 10 MG tablet TAKE ONE TABLET BY MOUTH EVERY NIGHT AT BEDTIME (Patient taking differently: Take 10 mg by mouth at bedtime. ) 90 tablet 1  . Multiple Vitamin (MULTIVITAMIN WITH MINERALS) TABS tablet Take 1 tablet by mouth daily.    Marland Kitchen oxyCODONE (OXY IR/ROXICODONE) 5 MG immediate release tablet Take 1 tablet (5 mg total) by mouth every 4 (four) hours as needed for severe pain. 30 tablet 0  . rosuvastatin (CRESTOR) 10 MG tablet Take 1 tablet (10 mg total) by mouth daily. 90 tablet 3  . sacubitril-valsartan (ENTRESTO) 49-51 MG Take 1 tablet by mouth 2 (two) times daily. 60 tablet 11  . venlafaxine XR (EFFEXOR-XR) 150 MG 24 hr capsule Take 1 capsule (150 mg total) by mouth daily. 90 capsule 3   No current facility-administered medications on file prior to visit.    Allergies  Allergen Reactions  . Ace Inhibitors Cough  . Clarithromycin     REACTION: GI sx  . Codeine Nausea Only  . Telmisartan Other (See Comments)    sleepy     Assessment/Plan:  1. CHF - BP near goal <130/80 mmHg. Medication adherence appears optimal with heart failure medications. Home BP readings have improved, however patient has some elevated readings that she attributes to pain due to recent lung biopsy and NSAID use. To better optimize heart failure medications based on GDMT, will increase Toprol XL to 50 mg daily. Patient agreed with change. Continued Entresto 49/51 mg twice daily and dapaglifozin 10 mg daily. Will plan to see patient in 1 month for further titration of heart failure medications.   Lorel Monaco, PharmD PGY2 [facility] [address], [place], [place] [address] Phone: [phone]; Fax: [phone]

## 2019-12-24 ENCOUNTER — Ambulatory Visit (INDEPENDENT_AMBULATORY_CARE_PROVIDER_SITE_OTHER): Payer: Medicare Other | Admitting: Family Medicine

## 2019-12-24 ENCOUNTER — Other Ambulatory Visit: Payer: Self-pay | Admitting: Family Medicine

## 2019-12-24 ENCOUNTER — Ambulatory Visit (INDEPENDENT_AMBULATORY_CARE_PROVIDER_SITE_OTHER): Payer: Medicare Other | Admitting: Pharmacist

## 2019-12-24 ENCOUNTER — Other Ambulatory Visit: Payer: Self-pay

## 2019-12-24 ENCOUNTER — Encounter: Payer: Self-pay | Admitting: Family Medicine

## 2019-12-24 VITALS — BP 132/80 | HR 71 | Temp 97.2°F | Resp 15 | Ht 67.0 in | Wt 195.2 lb

## 2019-12-24 VITALS — BP 138/78 | HR 73 | Wt 196.0 lb

## 2019-12-24 DIAGNOSIS — I5020 Unspecified systolic (congestive) heart failure: Secondary | ICD-10-CM

## 2019-12-24 DIAGNOSIS — Z9889 Other specified postprocedural states: Secondary | ICD-10-CM

## 2019-12-24 DIAGNOSIS — J849 Interstitial pulmonary disease, unspecified: Secondary | ICD-10-CM

## 2019-12-24 MED ORDER — METOPROLOL SUCCINATE ER 50 MG PO TB24: 50.0000 mg | ORAL_TABLET | Freq: Every day | ORAL | 3 refills | Status: DC

## 2019-12-24 NOTE — Patient Instructions (Signed)
It was nice to see you today!  Your goal blood pressure is <130/80 mmHg. In clinic, your blood pressure was 138/78 mmHg.  Medication Changes: Begin taking Metoprolol Succinate 50 mg daily  Continue Entresto 49/51 mg twice daily and Dapagliflozin 10 mg daily  Monitor blood pressure at home daily and keep a log (on your phone or piece of paper) to bring with you to your next visit. Write down date, time, blood pressure and pulse.  Keep up the good work with diet and exercise. Aim for a diet full of vegetables, fruit and lean meats (chicken, [place], fish). Try to limit salt intake by eating fresh or frozen vegetables (instead of canned), rinse canned vegetables prior to cooking and do not add any additional salt to meals.   Please give [place] a call at [phone] with any questions or concerns.

## 2019-12-24 NOTE — Progress Notes (Signed)
Subjective  CC:  Chief Complaint  Patient presents with  . Hospitalization Follow-up    still experiencing SOB and pain on right side, still has two stitches left in from surgery    HPI: Susan Hunt is a 74 y.o. female who presents to the office today to address the problems listed above in the chief complaint.  Status post lung biopsy.  Fortunately she is recovering.  Reviewed hospital course and discharge summary.  Pathology results are back.  She has follow-up with thoracic surgery and pulmonology later this week.  Her breathing is stable.  Pain control is much improved.  Continues to have pleuritic chest pain.  No fevers.  No cough. Assessment  1. History of lung biopsy   2. Interstitial lung disease (HCC)   3. HFrEF (heart failure with reduced ejection fraction) (HCC)      Plan   Interstitial lung disease status post lung biopsy: To follow-up with cardiothoracic surgery and pulmonology for further clarification of type of interstitial lung disease and treatment options.  Recovering well from biopsy.  Reviewed lab work.  Heart failure: Has improved her symptoms of dyspnea.  Has follow-up with cardiology  Follow up: 6 months for recheck Visit date not found  No orders of the defined types were placed in this encounter.  No orders of the defined types were placed in this encounter.     I reviewed the patients updated PMH, FH, and SocHx.    Patient Active Problem List   Diagnosis Date Noted  . Interstitial lung disease ([place]) 11/01/2019    Priority: High  . HFrEF (heart failure with reduced ejection fraction) ([place]) 11/01/2019    Priority: High  . Controlled type 2 diabetes mellitus without complication, without long-term current use of insulin ([place]) 07/19/2019    Priority: High  . Tubular adenoma of colon 03/26/2019    Priority: High  . Panlobular emphysema ([place]) 06/17/2014    Priority: High  . Major depression, chronic 01/28/2014    Priority: High  .  Obesity (BMI 30.0-34.9) 07/15/2013    Priority: High  . Acquired hypothyroidism 04/11/2007    Priority: High  . Mixed hyperlipidemia 04/11/2007    Priority: High  . Essential hypertension 04/11/2007    Priority: High  . Recurrent UTI 04/12/2019    Priority: Medium  . Hepatic steatosis 03/26/2019    Priority: Medium  . Osteopenia of femoral neck 05/24/2016    Priority: Medium  . RENAL CALCULUS, HX OF 04/11/2007    Priority: Medium  . ACE-inhibitor cough 10/02/2017    Priority: Low  . Postmenopausal symptoms 02/14/2017    Priority: Low  . Grade II hemorrhoids 05/03/2016    Priority: Low  . Snoring 05/06/2014    Priority: Low  . Allergic rhinitis 12/18/2009    Priority: Low  . Rosacea 04/11/2007    Priority: Low  . URINARY INCONTINENCE 04/11/2007    Priority: Low  . S/P robot-assisted surgical procedure 12/13/2019  . Chronic vertigo 07/19/2019  . Hyperglycemia 04/10/2019   Current Meds  Medication Sig  . acetaminophen (TYLENOL) 500 MG tablet Take 2 tablets (1,000 mg total) by mouth every 6 (six) hours as needed.  . cephALEXin (KEFLEX) 250 MG capsule Take 250 mg by mouth at bedtime.   . cetirizine (ZYRTEC) 10 MG tablet Take 10 mg by mouth daily.  . diphenhydrAMINE (BENADRYL) 25 mg capsule Take 25 mg by mouth at bedtime.  Marland Kitchen FARXIGA 10 MG TABS tablet Take 10 mg by mouth daily.  Marland Kitchen  fluticasone (FLONASE) 50 MCG/ACT nasal spray Place 2 sprays into both nostrils at bedtime as needed (allergies.).   Marland Kitchen levothyroxine (SYNTHROID) 125 MCG tablet Take 1 tablet (125 mcg total) by mouth daily.  . metoprolol succinate (TOPROL XL) 25 MG 24 hr tablet Take 1 tablet (25 mg total) by mouth daily.  . montelukast (SINGULAIR) 10 MG tablet TAKE ONE TABLET BY MOUTH EVERY NIGHT AT BEDTIME (Patient taking differently: Take 10 mg by mouth at bedtime. )  . Multiple Vitamin (MULTIVITAMIN WITH MINERALS) TABS tablet Take 1 tablet by mouth daily.  Marland Kitchen omeprazole (PRILOSEC) 20 MG capsule TAKE ONE CAPSULE BY  MOUTH DAILY  . oxyCODONE (OXY IR/ROXICODONE) 5 MG immediate release tablet Take 1 tablet (5 mg total) by mouth every 4 (four) hours as needed for severe pain.  . rosuvastatin (CRESTOR) 10 MG tablet Take 1 tablet (10 mg total) by mouth daily.  . sacubitril-valsartan (ENTRESTO) 49-51 MG Take 1 tablet by mouth 2 (two) times daily.  Marland Kitchen venlafaxine XR (EFFEXOR-XR) 150 MG 24 hr capsule Take 1 capsule (150 mg total) by mouth daily.    Allergies: Patient is allergic to ace inhibitors, clarithromycin, codeine, and telmisartan. Family History: Patient family history includes Alcohol abuse in her father; Diabetes in her maternal grandmother; Drug abuse in her son; Heart disease in her mother; Hypertension in her mother; Osteoporosis in her mother; Stroke in her mother. Social History:  Patient  reports that she quit smoking about 6 years ago. Her smoking use included cigarettes. She has a 7.50 pack-year smoking history. She has never used smokeless tobacco. She reports current alcohol use of about 1.0 standard drink of alcohol per week. She reports that she does not use drugs.  Review of Systems: Constitutional: Negative for fever malaise or anorexia Cardiovascular: negative for chest pain Respiratory: negative for SOB or persistent cough Gastrointestinal: negative for abdominal pain  Objective  Vitals: BP 132/80   Pulse 71   Temp (!) 97.2 F (36.2 C) (Temporal)   Resp 15   Ht _0  (1.702 m)   Wt 195 lb 3.2 oz (88.5 kg)   SpO2 96%   BMI 30.57 kg/m  General: no acute distress , A&Ox3 HEENT: PEERL, conjunctiva normal, neck is supple Cardiovascular:  RRR without murmur or gallop.  Respiratory:  Good breath sounds bilaterally, CTAB with normal respiratory effort Skin:  Warm, no rashes Right chest tube wound opening with serous drainage.  Nontender.  No redness or pus.  Removed remaining stitch and placed Steri-Strips to close wound.     Commons side effects, risks, benefits, and  alternatives for medications and treatment plan prescribed today were discussed, and the patient expressed understanding of the given instructions. Patient is instructed to call or message via MyChart if he/she has any questions or concerns regarding our treatment plan. No barriers to understanding were identified. We discussed Red Flag symptoms and signs in detail. Patient expressed understanding regarding what to do in case of urgent or emergency type symptoms.   Medication list was reconciled, printed and provided to the patient in AVS. Patient instructions and summary information was reviewed with the patient as documented in the AVS. This note was prepared with assistance of Dragon voice recognition software. Occasional wrong-word or sound-a-like substitutions may have occurred due to the inherent limitations of voice recognition software  This visit occurred during the SARS-CoV-2 public health emergency.  Safety protocols were in place, including screening questions prior to the visit, additional usage of staff PPE, and extensive cleaning  of exam room while observing appropriate contact time as indicated for disinfecting solutions.

## 2019-12-25 ENCOUNTER — Ambulatory Visit: Payer: Medicare Other | Admitting: Internal Medicine

## 2019-12-25 ENCOUNTER — Encounter: Payer: Self-pay | Admitting: Internal Medicine

## 2019-12-25 ENCOUNTER — Other Ambulatory Visit: Payer: Self-pay | Admitting: Thoracic Surgery (Cardiothoracic Vascular Surgery)

## 2019-12-25 VITALS — BP 149/81 | HR 72 | Ht 67.0 in | Wt 195.6 lb

## 2019-12-25 DIAGNOSIS — I428 Other cardiomyopathies: Secondary | ICD-10-CM | POA: Diagnosis not present

## 2019-12-25 DIAGNOSIS — E119 Type 2 diabetes mellitus without complications: Secondary | ICD-10-CM

## 2019-12-25 DIAGNOSIS — I10 Essential (primary) hypertension: Secondary | ICD-10-CM | POA: Diagnosis not present

## 2019-12-25 DIAGNOSIS — I5020 Unspecified systolic (congestive) heart failure: Secondary | ICD-10-CM

## 2019-12-25 DIAGNOSIS — E782 Mixed hyperlipidemia: Secondary | ICD-10-CM

## 2019-12-25 DIAGNOSIS — Z9889 Other specified postprocedural states: Secondary | ICD-10-CM

## 2019-12-25 DIAGNOSIS — Z79899 Other long term (current) drug therapy: Secondary | ICD-10-CM | POA: Diagnosis not present

## 2019-12-25 DIAGNOSIS — J849 Interstitial pulmonary disease, unspecified: Secondary | ICD-10-CM

## 2019-12-25 NOTE — Progress Notes (Signed)
Cardiology Office Note:    Date:  12/25/2019   ID:  [name], DOB 1945-06-16, MRN [id number]  PCP:  Leamon Arnt, MD  Cardiologist:  No primary care provider on file.  Electrophysiologist:  None   Referring MD: Leamon Arnt, MD   Chief Complaint/Reason for Referral: HFrEF, nonischemic CM  History of Present Illness:    Susan Hunt is a 74 y.o. female with a history of nonischemic cardiomyopathy with normal right and left heart pressures, hypertension, hyperlipidemia, interstitial lung disease with pulmonary fibrosis.  Presents for follow-up after cath for medication titration.   She saw the pharmacist yesterday for medication titration.  She briefly stopped Entresto but is currently tolerating the 49/51 mg twice daily dose, as well as Toprol-XL 25 mg daily and Farxiga 10 mg daily.  Yesterday her metoprolol was increased to 50 mg daily.  She continues to have shortness of breath and follows with pulmonary medicine as well as thoracic surgery.  No significant change in her symptoms though on heart failure therapy she feels slightly improved.  Denies chest pain.   Past Medical History:  Diagnosis Date   Anemia    Asthma    "I dont think I have it.   CHF (congestive heart failure) (HCC)    Depression    Emphysema of lung (HCC)    GERD (gastroesophageal reflux disease)    Hemorrhoids    History of kidney stones    Cystoscopy   Hyperlipidemia    Hypertension    Hypothyroidism    Obesity    Pre-diabetes    Rosacea    Tubular adenoma of colon 03/26/2019   Colonoscopy repeat in 3 years    Past Surgical History:  Procedure Laterality Date   ABDOMINAL HYSTERECTOMY     ANTERIOR AND POSTERIOR VAGINAL REPAIR  2020   CARDIAC CATHETERIZATION  11/01/2019   CATARACT EXTRACTION W/ INTRAOCULAR LENS IMPLANT Bilateral 2016   COLONOSCOPY     CYSTOSCOPY     DENTAL SURGERY  2016   Dental Implant   EYE SURGERY Bilateral    cataract    FRACTURE SURGERY Left 2013   Left ankle   INTERCOSTAL NERVE BLOCK Right 12/13/2019   Procedure: INTERCOSTAL NERVE BLOCK;  Surgeon: Melrose Nakayama, MD;  Location: [facility];  Service: Thoracic;  Laterality: Right;   LUNG BIOPSY Right 12/13/2019   Procedure: LUNG BIOPSY;  Surgeon: Melrose Nakayama, MD;  Location: [facility];  Service: Thoracic;  Laterality: Right;   LYMPH NODE BIOPSY Right 12/13/2019   Procedure: LYMPH NODE BIOPSY;  Surgeon: Melrose Nakayama, MD;  Location: [facility];  Service: Thoracic;  Laterality: Right;   medtronix nerve stimulator  07/18/2019   Complete InterStim Sacral Neuromudulation system implantation [facility]/18/19, [facility]   NASAL SINUS SURGERY     RIGHT/LEFT HEART CATH AND CORONARY ANGIOGRAPHY N/A 11/01/2019   Procedure: RIGHT/LEFT HEART CATH AND CORONARY ANGIOGRAPHY;  Surgeon: Martinique, Peter M, MD;  Location: [facility];  Service: Cardiovascular;  Laterality: N/A;   VAGINECTOMY, PARTIAL     VIDEO BRONCHOSCOPY N/A 12/13/2019   Procedure: VIDEO BRONCHOSCOPY;  Surgeon: Melrose Nakayama, MD;  Location: [facility];  Service: Thoracic;  Laterality: N/A;    Current Medications: Current Meds  Medication Sig   acetaminophen (TYLENOL) 500 MG tablet Take 2 tablets (1,000 mg total) by mouth every 6 (six) hours as needed.   cephALEXin (KEFLEX) 250 MG capsule Take 250 mg by mouth at bedtime.    cetirizine (ZYRTEC)  10 MG tablet Take 10 mg by mouth daily.   diphenhydrAMINE (BENADRYL) 25 mg capsule Take 25 mg by mouth at bedtime.   FARXIGA 10 MG TABS tablet Take 10 mg by mouth daily.   fluticasone (FLONASE) 50 MCG/ACT nasal spray Place 2 sprays into both nostrils at bedtime as needed (allergies.).    levothyroxine (SYNTHROID) 125 MCG tablet Take 1 tablet (125 mcg total) by mouth daily.   metoprolol succinate (TOPROL XL) 50 MG 24 hr tablet Take 1 tablet (50 mg total) by mouth daily.   montelukast (SINGULAIR) 10 MG tablet TAKE ONE TABLET BY MOUTH EVERY NIGHT AT  BEDTIME (Patient taking differently: Take 10 mg by mouth at bedtime. )   Multiple Vitamin (MULTIVITAMIN WITH MINERALS) TABS tablet Take 1 tablet by mouth daily.   omeprazole (PRILOSEC) 20 MG capsule TAKE ONE CAPSULE BY MOUTH DAILY   oxyCODONE (OXY IR/ROXICODONE) 5 MG immediate release tablet Take 1 tablet (5 mg total) by mouth every 4 (four) hours as needed for severe pain.   rosuvastatin (CRESTOR) 10 MG tablet Take 1 tablet (10 mg total) by mouth daily.   sacubitril-valsartan (ENTRESTO) 49-51 MG Take 1 tablet by mouth 2 (two) times daily.   venlafaxine XR (EFFEXOR-XR) 150 MG 24 hr capsule Take 1 capsule (150 mg total) by mouth daily.     Allergies:   Ace inhibitors, Clarithromycin, Codeine, and Telmisartan   Social History   Tobacco Use   Smoking status: Former Smoker    Packs/day: 0.50    Years: 15.00    Pack years: 7.50    Types: Cigarettes    Quit date: 08/18/2013    Years since quitting: 6.4   Smokeless tobacco: Never Used  Vaping Use   Vaping Use: Never used  Substance Use Topics   Alcohol use: Yes    Alcohol/week: 1.0 standard drink    Types: 1 Glasses of wine per week    Comment: 2-3 glasses of wine weekly   Drug use: No     Family History: The patient's family history includes Alcohol abuse in her father; Diabetes in her maternal grandmother; Drug abuse in her son; Heart disease in her mother; Hypertension in her mother; Osteoporosis in her mother; Stroke in her mother. There is no history of Breast cancer.  ROS:   Please see the history of present illness.    All other systems reviewed and are negative.  EKGs/Labs/Other Studies Reviewed:    The following studies were reviewed today:  Recent Labs: 12/09/2019: TSH 0.31 12/15/2019: ALT 30; BUN 19; Creatinine, Ser 1.29; Potassium 3.8; Sodium 137 12/16/2019: Hemoglobin 11.0; Platelets 265  Recent Lipid Panel    Component Value Date/Time   CHOL 155 12/09/2019 1645   TRIG 87 12/09/2019 1645   HDL 65  12/09/2019 1645   CHOLHDL 2.4 12/09/2019 1645   VLDL 28.4 07/19/2019 1333   LDLCALC 73 12/09/2019 1645   LDLDIRECT 146.7 05/29/2006 1115    Physical Exam:    VS:  BP (!) 149/81    Pulse 72    Ht _0  (1.702 m)    Wt 195 lb 9.6 oz (88.7 kg)    SpO2 97%    BMI 30.64 kg/m     Wt Readings from Last 5 Encounters:  12/31/19 194 lb (88 kg)  12/27/19 197 lb 12.8 oz (89.7 kg)  12/25/19 195 lb 9.6 oz (88.7 kg)  12/24/19 196 lb (88.9 kg)  12/24/19 195 lb 3.2 oz (88.5 kg)    Constitutional: No acute  distress Cardiovascular: regular rhythm, normal rate, no murmurs. S1 and S2 normal. Radial pulses normal bilaterally. No jugular venous distention.  Respiratory: clear to auscultation bilaterally GI : normal bowel sounds, soft and nontender. No distention.   MSK: extremities warm, well perfused. No edema.  NEURO: grossly nonfocal exam, moves all extremities. PSYCH: alert and oriented x 3, normal mood and affect.   ASSESSMENT:    1. HFrEF (heart failure with reduced ejection fraction) ([place])   2. Nonischemic cardiomyopathy ([place])   3. Medication management   4. Essential hypertension   5. Controlled type 2 diabetes mellitus without complication, without long-term current use of insulin ([place])   6. Mixed hyperlipidemia    PLAN:    HFrEF (heart failure with reduced ejection fraction) ([place]) - Plan: ECHOCARDIOGRAM COMPLETE  Heart Failure Therapy ACE-I/ARB/ARNI: Entresto 49/51 mg twice daily BB: Toprol-XL 50 mg daily MRA: Consider starting at next pharmacist visit.  Would recheck renal function and potassium at that time, if potassium remains slightly low and renal function permits, consider starting spironolactone 25 mg daily. SGLT2I: Farxiga 10 mg daily Diuretic plan: None currently, appears euvolemic.  We have discussed obtaining an echocardiogram 3 months from her last for medication effect.  This can be completed in December or January at patient preference.  Hyperlipidemia -Patient  continues on rosuvastatin 10 mg daily  Total time of encounter: 30 minutes total time of encounter, including 20 minutes spent in face-to-face patient care on the date of this encounter. This time includes coordination of care and counseling regarding above mentioned problem list. Remainder of non-face-to-face time involved reviewing chart documents/testing relevant to the patient encounter and documentation in the medical record. I have independently reviewed documentation from referring provider.   Cherlynn Kaiser, MD [place]   [facility]    Medication Adjustments/Labs and Tests Ordered: Current medicines are reviewed at length with the patient today.  Concerns regarding medicines are outlined above.   Orders Placed This Encounter  Procedures   ECHOCARDIOGRAM COMPLETE    No orders of the defined types were placed in this encounter.   Patient Instructions  Medication Instructions:  No Changes In Medications at this time.  *If you need a refill on your cardiac medications before your next appointment, please call your pharmacy*  Lab Work: None Ordered At This Time.  If you have labs (blood work) drawn today and your tests are completely normal, you will receive your results only by:  [place] (if you have MyChart) OR  A paper copy in the mail If you have any lab test that is abnormal or we need to change your treatment, we will call you to review the results.  Testing/Procedures: Your physician has requested that you have an echocardiogram IN [place]. Echocardiography is a painless test that uses sound waves to create images of your heart. It provides your doctor with information about the size and shape of your heart and how well your hearts chambers and valves are working. You may receive an ultrasound enhancing agent through an IV if needed to better visualize your heart during the echo.This procedure takes approximately one hour. There are no restrictions for  this procedure. This will take place at the [address], [address].   Follow-Up: At [facility], you and your health needs are our priority.  As part of our continuing mission to provide you with exceptional heart care, we have created designated Provider Care Teams.  These Care Teams include your primary Cardiologist (physician)  and Advanced Practice Providers (APPs -  Physician Assistants and Nurse Practitioners) who all work together to provide you with the care you need, when you need it.  Your next appointment:   3 month(s) (Following Echo, can be January)  The format for your next appointment:   In Person  Provider:   Cherlynn Kaiser, MD

## 2019-12-25 NOTE — Patient Instructions (Signed)
Medication Instructions:  No Changes In Medications at this time.  *If you need a refill on your cardiac medications before your next appointment, please call your pharmacy*  Lab Work: None Ordered At This Time.  If you have labs (blood work) drawn today and your tests are completely normal, you will receive your results only by:  [place] (if you have MyChart) OR  A paper copy in the mail If you have any lab test that is abnormal or we need to change your treatment, we will call you to review the results.  Testing/Procedures: Your physician has requested that you have an echocardiogram [place]. Echocardiography is a painless test that uses sound waves to create images of your heart. It provides your doctor with information about the size and shape of your heart and how well your hearts chambers and valves are working. You may receive an ultrasound enhancing agent through an IV if needed to better visualize your heart during the echo.This procedure takes approximately one hour. There are no restrictions for this procedure. This will take place at the [address], [address].   Follow-Up: At [facility], you and your health needs are our priority.  As part of our continuing mission to provide you with exceptional heart care, we have created designated Provider Care Teams.  These Care Teams include your primary Cardiologist (physician) and Advanced Practice Providers (APPs -  Physician Assistants and Nurse Practitioners) who all work together to provide you with the care you need, when you need it.  Your next appointment:   3 month(s) (Following Echo, can be January)  The format for your next appointment:   In Person  Provider:   Cherlynn Kaiser, MD

## 2019-12-27 ENCOUNTER — Encounter: Payer: Self-pay | Admitting: Internal Medicine

## 2019-12-27 ENCOUNTER — Telehealth: Payer: Self-pay

## 2019-12-27 ENCOUNTER — Telehealth: Payer: Self-pay | Admitting: Internal Medicine

## 2019-12-27 ENCOUNTER — Other Ambulatory Visit: Payer: Self-pay

## 2019-12-27 ENCOUNTER — Ambulatory Visit: Payer: Medicare Other | Admitting: Internal Medicine

## 2019-12-27 VITALS — BP 144/86 | HR 64 | Temp 97.3°F | Ht 67.0 in | Wt 197.8 lb

## 2019-12-27 DIAGNOSIS — J84112 Idiopathic pulmonary fibrosis: Secondary | ICD-10-CM | POA: Diagnosis not present

## 2019-12-27 DIAGNOSIS — D3A090 Benign carcinoid tumor of the bronchus and lung: Secondary | ICD-10-CM | POA: Diagnosis not present

## 2019-12-27 DIAGNOSIS — J849 Interstitial pulmonary disease, unspecified: Secondary | ICD-10-CM

## 2019-12-27 DIAGNOSIS — Z9889 Other specified postprocedural states: Secondary | ICD-10-CM | POA: Diagnosis not present

## 2019-12-27 NOTE — Telephone Encounter (Signed)
Documents received.  Submitted a Prior Authorization request to [facility] for [place] via Cover My Meds. Will update once we receive a response.   Key: [id number] - [place] Case ID: [id number]

## 2019-12-27 NOTE — Telephone Encounter (Signed)
Enrollment from for [facility] has been completed and faxed to pharmacy team.

## 2019-12-27 NOTE — Progress Notes (Signed)
OV 08/19/2019  Subjective:  Patient ID: Susan Hunt, female , DOB: 1945-11-08 , age 74 y.o. , MRN: [id number] , ADDRESS: [place] [address]   08/19/2019 -   Chief Complaint  Patient presents with  . Consult    COPD, emphysema.  sob with exertion.  dry coughs alot at night     HPI Susan Hunt 74 y.o. -she is a retired Marine scientist.  She used to work at the [employer] across [place] long and having retired from that 10 years ago and then she worked for another agency apparently having retired from all duties few years ago.  Husband is a retired Software engineer.  They are here for shortness of breath.  She tells me that she has had insidious onset of shortness of breath and cough that started 1 year ago and has been progressive.  In the same time she is also been on nitrofurantoin for times.  First 3 of these were 1 week each.  The last 1 being for at least 6 weeks ending approximately 4-6 weeks ago.  Because of progressive symptoms she was given Stiolto but this did not help.  She has been on longstanding Singulair for several years independent of these problems.  This because of allergies.  Therefore she has been referred here.  She did have a chest x-ray that on my personal visualization shows ILD changes.  It is documented below.  She did have a CT abdomen lung images that was reported as normal but I am not so sure from a year ago.  She is a previous smoker.  She is also self-referred herself to [facility] on the basis that she think she might have advanced COPD and is looking for a Zephyr valve.  Symptoms started in August 2020.   [facility] Comprehensive ILD Questionnaire  Symptoms:   SYMPTOM SCALE - ILD 08/19/2019   O2 use ra  Shortness of Breath 0 -> 5 scale with 5 being worst (score 6 If unable to do)  At rest 1  Simple tasks - showers, clothes change, eating, shaving 2  Household (dishes, doing bed, laundry) 5  Shopping 5  Walking  level at own pace 2  Walking up Stairs 5  Total (30-36) Dyspnea Score 20  How bad is your cough? x  How bad is your fatigue x  How bad is nausea x  How bad is vomiting?  x  How bad is diarrhea? x  How bad is anxiety? x  How bad is depression x    She does have a cough.  She says the cough started in August 2020 the same for shortness of breath started.  She does cough at night.  She does cough when she lies down and it gets worse.  She does feel a tickle in her throat.  Cough does not affect her voice she does not bring any phlegm.  There is no wheezing.  There is no nausea no vomiting no diarrhea.    Past Medical History :   She does have a histo abdomen ry of asthma for the last few years.  Also COPD not otherwise specified for the last few years.  His acid reflux disease for the last few years.  Diabetes for the last few weeks and thyroid disease for the last few years.  Otherwise denies any collagen vascular disease or vasculitis.  Denies any sleep apnea.  Denies pulmonary hypertension.  Denies stroke denies pneumonias  recurrent pneumonias.  Denies heart disease or pleurisy.   ROS: Positive for fatigue for the last several months.  Arthralgia for the last several months.  Dry eyes for the last several months.  With intermittent nausea for the last few years.  T there is daytime sleepiness for the last several months.     FAMILY HISTORY of LUNG DISEASE:  -Denies any pulmonary fibrosis.  Denies COPD denies asthma in the family.  Denies sarcoid denies cystic fibrosis denies hypersensitive pneumonitis.  Denies any autoimmune disease.   EXPOSURE HISTORY:   -She denies any Covid history.  Denies any exposure to Covid.  She did do Covid vaccine.  No reactions after the Covid vaccine.  Did smoke cigarettes 15-year 2000 and 2015 10 to 12 cigarettes a day.  Did not do any passive smoking.  Never did any marijuana.  Elevated electronic cigarettes.  Never use cocaine never used intravenous drug  use.   HOME and HOBBY DETAILS :  -[place]  Is lived there for 21 years.  Age of the home is 34 years.  In 2004 there was mold/mildew in the shower curtain.  And then there was mold under the shower.  This was torn down.  This was in 2004 but currently none.  She did some mulch back at work 2 months ago.  No humidifier use no CPAP use.  No nebulizer use.  No steam iron use.  No Jacuzzi use.  No misting Fountain in the house.  No pet birds or parakeets no gerbils.  No feather pillows.  No mold in the [facility] duct.  No music habits.   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  No otehrr organic antigen exposure that she recollects (MR side note: a different patient in 2016 reported that in 2013-2016 time lot of renovations there and mold +).  Patient reported on September 10, 2019: That between 1966 and 1980 she worked at [employer] and [place].  At that time there is lot of mold exposure in the building.  The building was cleaned out.  Also checked for inorganic antigen exposure and this is negative.  Never worked in a dusty environment.  Never exposed to fumes.   PULMONARY TOXICITY HISTORY (27 items): Use nitrofurantoin in 2019 and 2020.  This is described below.  Question if you can for this there is      Simple office walk 185 feet x  3 laps goal with forehead probe 08/19/2019   O2 used ra  Number laps completed Attempted 3 but desaturated at 2  Comments about pace Normal pace  Resting Pulse Ox/HR 100% and 101/min  Final Pulse Ox/HR 86% and 116/min  Desaturated </= 88% yes  Desaturated <= 3% points Yes , 14  Got Tachycardic >/= 90/min yes  Symptoms at end of test dyspneic  Miscellaneous comments Corrected with  2L [place]    Nuclear medicine cardiac stress test March 2016: Overall Impression:  Normal stress nuclear study with a small, mild,  fixed apical defect consistent with thinning; no ischemia. LV Wall Motion:  NL LV Function; NL Wall Motion   CT abdomen lung cuts September 2020: Reported as normal.  I personally visualized and retrospect looks hazy and whether this is atelectasis or some other findings I do not know.  Jul 19, 2019:: Personally visualized shows findings of ILD.  I  am not able to see a prior chest x-ray in the system or CT scan of the chest    Results for RICKI, VANHANDEL "PAM" (MRN [id number]) as of 08/19/2019 15:18  Ref. Range 03/26/2019 13:45  Creatinine Latest Ref Range: 0.40 - 1.20 mg/dL 1.05   ROS - per HPI Results for BEYA, TIPPS "PAM" (MRN [id number]) as of 08/19/2019 15:18  Ref. Range 03/26/2019 13:45  Hemoglobin Latest Ref Range: 12.0 - 15.0 g/dL 14.0     OV 09/10/2019  Subjective:  Patient ID: Susan Hunt, female , DOB: Nov 21, 1945 , age 73 y.o. , MRN: [id number] , ADDRESS: [place] [address]   09/10/2019 -   Chief Complaint  Patient presents with  . Follow-up    shortness of breath with exertion and non-productive cough     HPI IDALIS HOELTING 74 y.o. -presents for follow-up to discuss results of dyspnea work-up.  High-resolution CT chest was personally visualized.  It shows presence of interstitial lung disease.  It needs Fleischner criteria for diagnosis not consistent with UIP.  Per ATS this is alternative diagnosis to UIP.  Radiologist raised the concern of hypersensitive pneumonitis but there is no air-trapping.  The other possibilities NSIP.  She is no longer on nitrofurantoin.  In any event unit she only took it intermittently.  Overall she is stable since last visit.  She had autoimmune profile.  Is only trace positive for ANA.  Her IgE is slightly elevated.  Her PFT shows mild restriction with reduction in diffusion capacity.  We went over her exposure history again.  This time she did recollect being exposed to mold for 15 to 20 years up until 1980  while living in [place] and working at [employer].  She does not recollect mold at behavioral [facility] where she worked recently until she retired.  However another patient of mine did recollect mold in this building at [employer].   Low risk cardiac stress test in 2016 but with ejection fraction 54%.-Since then no overall change in health.  Husband inquired about the low ejection fraction.  In terms of symptoms: She is try to get portable oxygen system.  She is switching company to [employer] because of the shortage of portable oxygen systems at [facility].     OV 11/12/2019   Subjective:  Patient ID: Susan Hunt, female , DOB: 03-27-1945, age 75 y.o. years. , MRN: [id number],  ADDRESS: [place] [address] PCP  Leamon Arnt, MD Providers : Treatment Team:  Attending Provider: Brand Males, MD Thoracic surgeon: Dr. Roxan Hockey Cardiologist Dr. Margaretann Loveless  Chief Complaint  Patient presents with  . Follow-up    SOB    Follow-up interstitial lung disease not otherwise specified   HPI SHONTAE ROSILES 74 y.o. -returns for follow-up.  Her husband is with her.  After her last visit she saw cardiothoracic surgery but we had to hold the surgical lung biopsy because her echocardiogram from September 13, 2019 showed a drop in left ventricular systolic ejection fraction.  Since then she is seen cardiology.  She had a heart catheterization that shows ejection action 40% still low.  Nonobstructive coronary artery disease.  She is followed up with cardiology and she has been started on beta-blocker.  On her father medications was also adjusted.  So far no change in dyspnea.  She tells me overall dyspnea is the same without any change.  Symptom scores shows a two-point shift with worsening  but she told the medical assistant that she is feeling worse.  Walking desaturation test today shows - normalcy .  She wants portable oxygen but the first DME company [facility]  did not have portable oxygen tanks.  She is found another company [employer] that is able to give her portable oxygen.  She needs a requalifying walk that is documented below.  In terms of surgical lung biopsy: She did have some prominent mediastinal nodes.  It appears Dr. Roxan Hockey has informed them that he will do a EBUS for this.  They have also been told by cardiology that from a cardiac standpoint patient would be a suitable candidate for surgical lung biopsy.  The husband wanted to know if a surgical biopsy is indicated and also my perspective and safety.  SYMPTOM SCALE - ILD 08/19/2019  11/12/2019  12/27/2019   O2 use ra ra ra  Shortness of Breath 0 -> 5 scale with 5 being worst (score 6 If unable to do)    At rest 1 1 0  Simple tasks - showers, clothes change, eating, shaving _0 Household (dishes, doing bed, laundry) _1 Shopping _2 Walking level at own pace _3 Walking up Stairs _4 Total (30-36) Dyspnea Score _5 How bad is your cough? x 2 0  How bad is your fatigue x 5 1  How bad is nausea x 1 0  How bad is vomiting?  x 1 0  How bad is diarrhea? x 0 0  How bad is anxiety? x 0 -0  How bad is depression x 0 0     Simple office walk 185 feet x  3 laps goal with forehead probe 08/19/2019  11/12/2019   O2 used ra ra  Number laps completed Attempted 3 but desaturated at 2 Did alll 3 on this repeat walk  Comments about pace Normal pace   Resting Pulse Ox/HR 100% and 101/min 96% and 60/min  Final Pulse Ox/HR 86% and 116/min 94% and 80/min  Desaturated </= 88% yes no  Desaturated <= 3% points Yes , 14 no  Got Tachycardic >/= 90/min yes no  Symptoms at end of test dyspneic mild  Miscellaneous comments Corrected with  2L [place] Will return o2   ROS - per HPI  xxxxxxxxxxxxxxxxxxxxxxxxxx Results for AUDIA, AMICK "PAM" (MRN [id number]) as of 09/10/2019 13:56  Ref. Range 08/23/2019 10:53  FVC-Pre Latest Units: L 2.61  FVC-%Pred-Pre Latest Units: % 80    FEV1-Pre Latest Units: L 2.26  FEV1-%Pred-Pre Latest Units: % 92  Results for MIKEILA, BURGEN "PAM" (MRN [id number]) as of 09/10/2019 13:56  Ref. Range 08/23/2019 10:53  DLCO cor Latest Units: ml/min/mmHg 13.76  DLCO cor % pred Latest Units: % 64   Results for ALVARETTA, EISENBERGER "PAM" (MRN [id number]) as of 09/10/2019 13:56  Ref. Range 08/23/2019 10:53  TLC Latest Units: L 4.15  TLC % pred Latest Units: % 75   xxxxxxxxxxxxxxxxxxxxxx  CT chest high resolution June 2021 Lungs/Pleura: Mild pulmonary fibrosis in a pattern with apical predominance, featuring irregular peripheral interstitial opacity and scattered areas of subpleural bronchiolectasis. Mild, tubular bronchiectasis throughout. No significant air trapping on expiratory phase imaging. No pleural effusion or pneumothorax.  Upper Abdomen: No acute abnormality.  Musculoskeletal: No chest wall mass or suspicious bone lesions identified.  IMPRESSION: 1. Mild pulmonary fibrosis in a pattern with apical predominance, featuring irregular peripheral interstitial opacity and scattered  areas of subpleural bronchiolectasis. Mild, tubular bronchiectasis throughout. Findings are most consistent with an "alternative diagnosis" pattern by ATS pulmonary fibrosis criteria, leading differential considerations chronic fibrotic hypersensitivity pneumonitis or NSIP. 2. Prominent mediastinal lymph nodes, nonspecific and likely reactive. 3. Coronary artery disease.  Aortic Atherosclerosis (ICD10-I70.0).   Electronically Signed   By: Eddie Candle M.D.   On: 09/04/2019 15:37    xxxxxxxxxxxxxxxxxxxxxxxx[id number]  Results for AMBUR, PROVINCE "PAM" (MRN [id number]) as of 09/10/2019 13:56  Ref. Range 08/19/2019 16:30 08/19/2019 16:30  SEE BELOW Unknown  Comment  Anti Nuclear Antibody (ANA) Latest Ref Range: NEGATIVE  POSITIVE (A)   ANA Pattern 1 Unknown Cytoplasmic (A)   ANA Titer 1 Unknown 1:80 (H) Negative   Angiotensin-Converting Enzyme Latest Ref Range: 9 - 67 U/L 35   Anti JO-1 Latest Ref Range: 0.0 - 0.9 AI  <0.2  CENTROMERE AB SCREEN Latest Ref Range: 0.0 - 0.9 AI  <5.3  Cyclic Citrullin Peptide Ab Latest Units: UNITS <16   dsDNA Ab Latest Ref Range: 0 - 9 IU/mL  5  ENA RNP Ab Latest Ref Range: 0.0 - 0.9 AI  0.6  ENA SSA (RO) Ab Latest Ref Range: 0.0 - 0.9 AI  <0.2  ENA SSB (LA) Ab Latest Ref Range: 0.0 - 0.9 AI  <0.2  Myeloperoxidase Abs Latest Units: AI <1.0   Serine Protease 3 Latest Units: AI <1.0   RA Latex Turbid. Latest Ref Range: <14 IU/mL <14   ENA SM Ab Ser-aCnc Latest Ref Range: 0.0 - 0.9 AI  <0.2  Chromatin Ab SerPl-aCnc Latest Ref Range: 0.0 - 0.9 AI  <0.2     has a past medical history of Asthma, Depression, Emphysema of lung ([place]), Hemorrhoids, Hyperlipidemia, Hypertension, Hypothyroidism, Obesity, Rosacea, and Tubular adenoma of colon (03/26/2019).  IMPRESSIONS   ECHO 09/13/19 1. Left ventricular ejection fraction, by estimation, is 30 to 35%. The  left ventricle has moderately decreased function. The left ventricle  demonstrates global hypokinesis. Apex poorly visualized, consider repeat  limited echo with contrast to rule out  apical thrombus. The left ventricular internal cavity size was moderately  dilated. Left ventricular diastolic parameters are consistent with Grade I  diastolic dysfunction (impaired relaxation).  2. Right ventricular systolic function is normal. The right ventricular  size is normal. Tricuspid regurgitation signal is inadequate for assessing  PA pressure.  3. The mitral valve is normal in structure. Trivial mitral valve  regurgitation.  4. The aortic valve was not well visualized. Aortic valve regurgitation  is not visualized. No aortic stenosis is present.  CATH 8?20/21  Prox LAD to Mid LAD lesion is 30% stenosed.  Ost Cx to Prox Cx lesion is 35% stenosed.  Prox RCA lesion is 35% stenosed.  There is moderate left ventricular  systolic dysfunction.  LV end diastolic pressure is normal.  The left ventricular ejection fraction is 35-45% by visual estimate.   1. Nonobstructive CAD 2. Moderate LV dysfunction. EF estimated at 40% with global hypokinesis 3. Normal LV filling pressures 4. Normal right heart pressures 5. Normal cardiac output.   Plan: medical therapy for LV dysfunction and lipid lowering therapy for nonobstructive CAD.    OV 12/27/2019  Subjective:  Patient ID: Susan Hunt, female , DOB: [birth year] , age 60 y.o. , MRN: [id number] , ADDRESS: [place] [address] PCP Leamon Arnt, MD Patient Care Team: Leamon Arnt, MD as PCP - General (Family Medicine) Elroy Channel, MD as Referring Physician (Internal Medicine)  Parker-Autry, Candace as Consulting Physician (Obstetrics and Gynecology) Trinda Pascal, FNP as Consulting Physician (Urology) Brand Males, MD as Consulting Physician (Pulmonary Disease)  This Provider for this visit: Treatment Team:  Attending Provider: Brand Males, MD    12/27/2019 -   Chief Complaint  Patient presents with  . Follow-up    discuss biopsy results. SOB sometimes. denies cough or wheezing.     HPI TIMMIE CALIX 74 y.o. --follow-up interstitial lung disease.  Had surgical biopsy December 13, 2019.  After that she is was in the hospital requiring oxygen but then got discharged without oxygen.  At this point time she is much improved but still having soreness from her chest incision site.  Husband and she are here.  They are concerned about some postoperative atelectasis that was seen.  I visualized the x-ray.  Today walking desaturation test is normal.  I reassured them about this.  But I will give her incentive spirometer.  The main issue is to discuss the biopsy results: The bronchoalveolar lavage shows mixed cellularity with neutrophilia.  The surgical lung biopsy itself shows UIP with just 1 carcinoid tumor.   Reviewing her clinical history this UIP pattern is now consistent with IPF.  I gave her the diagnosis.  Regarding the carcinoid tumor let: I think this is an incidental finding.  There is no  DIPNECH syndrome  Her current symptom and walk profile are stable.  SYMPTOM SCALE - ILD 08/19/2019  11/12/2019  12/27/2019 Last Weight  Most recent update: 12/27/2019 11:48 AM   Weight  89.7 kg (197 lb 12.8 oz)             O2 use ra ra ra  Shortness of Breath 0 -> 5 scale with 5 being worst (score 6 If unable to do)    At rest 1 1 0  Simple tasks - showers, clothes change, eating, shaving _0 Household (dishes, doing bed, laundry) _1 Shopping _2 Walking level at own pace _3 Walking up Stairs _4 Total (30-36) Dyspnea Score _5 How bad is your cough? x 2 0  How bad is your fatigue x 5 1  How bad is nausea x 1 0  How bad is vomiting?  x 1 0  How bad is diarrhea? x 0 0  How bad is anxiety? x 0 0  How bad is depression x 0 0     Simple office walk 185 feet x  3 laps goal with forehead probe 08/19/2019  11/12/2019  12/27/19 BRL  O2 used ra ra ra  Number laps completed Attempted 3 but desaturated at 2 Did alll 3 on this repeat walk - on beta blocker   Comments about pace Normal pace  Nl pace  Resting Pulse Ox/HR 100% and 101/min 96% and 60/min 94% and 71/min  Final Pulse Ox/HR 86% and 116/min 94% and 80/min 96% and 87/min  Desaturated </= 88% yes no   Desaturated <= 3% points Yes , 14 no   Got Tachycardic >/= 90/min yes no   Symptoms at end of test dyspneic mild none  Miscellaneous comments Corrected with  2L [place] Will return o2 back to dme      PFT Results Latest Ref Rng & Units 08/23/2019  FVC-Pre L 2.61  FVC-Predicted Pre % 80  FVC-Post L 2.57  FVC-Predicted Post % 79  Pre FEV1/FVC % % 87  Post FEV1/FCV % %  92  FEV1-Pre L 2.26  FEV1-Predicted Pre % 92  FEV1-Post L 2.36  DLCO uncorrected ml/min/mmHg 13.84  DLCO UNC% % 65  DLCO corrected ml/min/mmHg 13.76   DLCO COR %Predicted % 64  DLVA Predicted % 88  TLC L 4.15  TLC % Predicted % 75  RV % Predicted % 63   Results for MIRIAH, MARUYAMA "PAM" (MRN [id number]) as of 12/27/2019 12:06  Ref. Range 12/13/2019 08:05  Color, Fluid Latest Ref Range: YELLOW  COLORLESS (A)  Total Nucleated Cell Count, Fluid Latest Ref Range: 0 - 1,000 cu mm 18  Lymphs, Fluid Latest Units: % 25  Eos, Fluid Latest Units: % 8  Appearance, Fluid Latest Ref Range: CLEAR  HAZY (A)  Other Cells, Fluid Latest Units: % MESOTHELIAL CELLS NOTED  Neutrophil Count, Fluid Latest Ref Range: 0 - 25 % 44 (H)  Monocyte-Macrophage-Serous Fluid Latest Ref Range: 50 - 90 % 23 (L)    ROS - per HPI     has a past medical history of Anemia, Asthma, CHF (congestive heart failure) (HCC), Depression, Emphysema of lung (HCC), GERD (gastroesophageal reflux disease), Hemorrhoids, History of kidney stones, Hyperlipidemia, Hypertension, Hypothyroidism, Obesity, Pre-diabetes, Rosacea, and Tubular adenoma of colon (03/26/2019).   reports that she quit smoking about 6 years ago. Her smoking use included cigarettes. She has a 7.50 pack-year smoking history. She has never used smokeless tobacco.  Past Surgical History:  Procedure Laterality Date  . ABDOMINAL HYSTERECTOMY    . ANTERIOR AND POSTERIOR VAGINAL REPAIR  2020  . CARDIAC CATHETERIZATION  11/01/2019  . CATARACT EXTRACTION W/ INTRAOCULAR LENS IMPLANT Bilateral 2016  . COLONOSCOPY    . CYSTOSCOPY    . DENTAL SURGERY  2016   Dental Implant  . EYE SURGERY Bilateral    cataract  . FRACTURE SURGERY Left 2013   Left ankle  . INTERCOSTAL NERVE BLOCK Right 12/13/2019   Procedure: INTERCOSTAL NERVE BLOCK;  Surgeon: Melrose Nakayama, MD;  Location: [place];  Service: Thoracic;  Laterality: Right;  . LUNG BIOPSY Right 12/13/2019   Procedure: LUNG BIOPSY;  Surgeon: Melrose Nakayama, MD;  Location: [place];  Service: Thoracic;  Laterality: Right;  . LYMPH NODE BIOPSY Right 12/13/2019    Procedure: LYMPH NODE BIOPSY;  Surgeon: Melrose Nakayama, MD;  Location: [place];  Service: Thoracic;  Laterality: Right;  . medtronix nerve stimulator  07/18/2019   Complete InterStim Sacral Neuromudulation system implantation 07/18/19, [facility]  . NASAL SINUS SURGERY    . RIGHT/LEFT HEART CATH AND CORONARY ANGIOGRAPHY N/A 11/01/2019   Procedure: RIGHT/LEFT HEART CATH AND CORONARY ANGIOGRAPHY;  Surgeon: Martinique, Peter M, MD;  Location: [place] [facility];  Service: Cardiovascular;  Laterality: N/A;  . VAGINECTOMY, PARTIAL    . VIDEO BRONCHOSCOPY N/A 12/13/2019   Procedure: VIDEO BRONCHOSCOPY;  Surgeon: Melrose Nakayama, MD;  Location[facility] OR;  Service: Thoracic;  Laterality: N/A;    Allergies  Allergen Reactions  . Ace Inhibitors Cough  . Clarithromycin     REACTION: GI sx  . Codeine Nausea Only  . Telmisartan Other (See Comments)    sleepy    Immunization History  Administered Date(s) Administered  . Fluad Quad(high Dose 65+) 12/09/2019  . Hepatitis A, Adult 04/06/2018  . Influenza Split 01/06/2009  . Influenza, High Dose Seasonal PF 12/17/2014, 12/20/2015, 01/15/2017, 12/18/2017, 11/28/2018  . Influenza, Seasonal, Injecte, Preservative Fre 01/07/2014, 12/17/2014  . Influenza,inj,Quad PF,6+ Mos 01/15/2017  . Influenza,trivalent, recombinat, inj, PF 12/03/2012  . Influenza-Unspecified 01/15/2017  .  Moderna SARS-COVID-2 Vaccination 04/17/2019, 05/15/2019  . Pneumococcal Conjugate-13 01/07/2014  . Pneumococcal Polysaccharide-23 11/23/2011  . Td 03/14/2006, 10/16/2011  . Zoster 12/06/2013  . Zoster Recombinat (Shingrix) 10/21/2017, 12/18/2017    Family History  Problem Relation Age of Onset  . Heart disease Mother   . Hypertension Mother   . Osteoporosis Mother   . Stroke Mother   . Alcohol abuse Father   . Diabetes Maternal Grandmother   . Drug abuse Son   . Breast cancer Neg Hx      Current Outpatient Medications:  .  acetaminophen (TYLENOL) 500 MG tablet, Take 2  tablets (1,000 mg total) by mouth every 6 (six) hours as needed., Disp: 30 tablet, Rfl: 0 .  cephALEXin (KEFLEX) 250 MG capsule, Take 250 mg by mouth at bedtime. , Disp: , Rfl:  .  cetirizine (ZYRTEC) 10 MG tablet, Take 10 mg by mouth daily., Disp: , Rfl:  .  diphenhydrAMINE (BENADRYL) 25 mg capsule, Take 25 mg by mouth at bedtime., Disp: , Rfl:  .  FARXIGA 10 MG TABS tablet, Take 10 mg by mouth daily., Disp: , Rfl:  .  fluticasone (FLONASE) 50 MCG/ACT nasal spray, Place 2 sprays into both nostrils at bedtime as needed (allergies.). , Disp: , Rfl:  .  levothyroxine (SYNTHROID) 125 MCG tablet, Take 1 tablet (125 mcg total) by mouth daily., Disp: 90 tablet, Rfl: 0 .  metoprolol succinate (TOPROL XL) 50 MG 24 hr tablet, Take 1 tablet (50 mg total) by mouth daily., Disp: 90 tablet, Rfl: 3 .  montelukast (SINGULAIR) 10 MG tablet, TAKE ONE TABLET BY MOUTH EVERY NIGHT AT BEDTIME (Patient taking differently: Take 10 mg by mouth at bedtime. ), Disp: 90 tablet, Rfl: 1 .  Multiple Vitamin (MULTIVITAMIN WITH MINERALS) TABS tablet, Take 1 tablet by mouth daily., Disp: , Rfl:  .  omeprazole (PRILOSEC) 20 MG capsule, TAKE ONE CAPSULE BY MOUTH DAILY, Disp: 90 capsule, Rfl: 3 .  oxyCODONE (OXY IR/ROXICODONE) 5 MG immediate release tablet, Take 1 tablet (5 mg total) by mouth every 4 (four) hours as needed for severe pain., Disp: 30 tablet, Rfl: 0 .  rosuvastatin (CRESTOR) 10 MG tablet, Take 1 tablet (10 mg total) by mouth daily., Disp: 90 tablet, Rfl: 3 .  sacubitril-valsartan (ENTRESTO) 49-51 MG, Take 1 tablet by mouth 2 (two) times daily., Disp: 60 tablet, Rfl: 11 .  venlafaxine XR (EFFEXOR-XR) 150 MG 24 hr capsule, Take 1 capsule (150 mg total) by mouth daily., Disp: 90 capsule, Rfl: 3      Objective:   Vitals:   12/27/19 1147  BP: (!) 144/86  Pulse: 64  Temp: (!) 97.3 F (36.3 C)  TempSrc: Temporal  SpO2: 94%  Weight: 197 lb 12.8 oz (89.7 kg)  Height: _0  (1.702 m)    Estimated body mass index is  30.98 kg/m as calculated from the following:   Height as of this encounter: _1  (1.702 m).   Weight as of this encounter: 197 lb 12.8 oz (89.7 kg).  _2 @  Filed Weights   12/27/19 1147  Weight: 197 lb 12.8 oz (89.7 kg)     Physical Exam Looks well.  Alert and oriented x3.  Husband with her.  Right infra axillary surgical incisions healing well with some induration.     Assessment:       ICD-10-CM   1. ILD (interstitial lung disease) ([place])  J84.9 AMB REFERRAL FOR DME    CANCELED: AMB REFERRAL FOR DME  2. IPF (idiopathic  pulmonary fibrosis) ([place])  J84.112   3. History of lung biopsy  Z98.890   4. 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  Diagnosis date is 12/13/2019.  IPF is generally a progressive disease.  It is the most common variety of pulmonary fibrosis.  There is no cure for this.  2 medications are protocol antifibrotic's they have been in the market for 7 years..  They tend to slow the disease down.  Based on the side effect profile we have chosen pirfenidone as drug of choice  Plan -Start pirfenidone/Esbriet per protocol  -Apply sunscreen with this medication, take this 5-6 hours apart, start slowly and work yourself up to 3 pills 3 times daily  -Refer to our pharmacist for overall education about the drug and working with insurance paperwork  -Visit [email or web address] for further information on IPF -You can also email Marlane Mingle patient support group leader -[email or web address] -Handicap placard signature given today for the DMV -In the future we can discuss clinical trials in research as a care option  History of lung biopsy  -There was some postoperative atelectasis on early October chest x-ray  Plan -Start incentive  spirometer - Do keep follow-up with Dr. 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  No specific follow-up  Plan -No specific follow-up   Follow-up -6 weeks with Dr. Chase Caller in [place] office for a 30-minute visit  -ILD symptom score and walking desaturation test at follow-up.     Addendum: Extensively discussed pirfenidone versus nintedanib.  She prefers to have low appetite and weight loss and nausea as a side effect over diarrhea.  In addition she has cardiac issues.  Therefore we took a shared decision making to opt for pirfenidone.  Explained various issues in managing pirfenidone including sunscreen serial monitoring taking it with food spacing between meals watching for side effects.   ( Level 05 visit: Estb 40-54 min  in  visit type: on-site physical face to visit  in total care time and counseling or/and coordination of care by this undersigned MD - Dr Brand Males. This includes one or more of the following on this same day 12/27/2019: pre-charting, chart review, note writing, documentation discussion of test results, diagnostic or treatment recommendations, prognosis, risks and benefits of management options, instructions, education, compliance or risk-factor reduction. It excludes time spent by the [place] or office staff in the care of the patient. Actual time 40 min)  As well as a   SIGNATURE    Dr. Brand Males, M.D., F.C.C.P,  Pulmonary and Critical Care Medicine Staff Physician, [place] Director - Interstitial Lung Disease  Program  Pulmonary [place] at [place], [place], [address]  Pager: [phone], If no answer or between  15:00h - 7:00h: call [phone] Telephone: [phone]  12:48 PM 12/27/2019

## 2019-12-27 NOTE — Patient Instructions (Addendum)
ILD (interstitial lung disease) ([place]) IPF (idiopathic pulmonary fibrosis) ([place])  -Based on lung biopsy 12/13/2019 I have given you the diagnosis of IPF on 12/27/2019.  Diagnosis date is 12/13/2019.  IPF is generally a progressive disease.  It is the most common variety of pulmonary fibrosis.  There is no cure for this.  2 medications are protocol antifibrotic's they have been in the market for 7 years..  They tend to slow the disease down.  Based on the side effect profile we have chosen pirfenidone as drug of choice  Plan -Start pirfenidone/Esbriet per protocol  -Apply sunscreen with this medication, take this 5-6 hours apart, start slowly and work yourself up to 3 pills 3 times daily  -Refer to our pharmacist for overall education about the drug and working with insurance paperwork  -Visit [email or web address] for further information on IPF -You can also email Marlane Mingle patient support group leader -[email or web address] -Handicap placard signature given today for the DMV -In the future we can discuss clinical trials in research as a care option  History of lung biopsy  -There was some postoperative atelectasis on early October chest x-ray  Plan -Start incentive spirometer - Do keep follow-up with Dr. 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  No specific follow-up  Plan -No specific follow-up   Follow-up -6 weeks with Dr. Chase Caller in [place] office for a 30-minute visit  -ILD symptom score and walking desaturation test at follow-up.

## 2019-12-30 NOTE — Telephone Encounter (Signed)
Received notification from [facility] regarding a prior authorization for ESBRIET. Authorization has been APPROVED from 03/15/19 to 03/13/21.   Authorization # [id number]  Ran test claim for 1 month supply. Patient's copay for 1 month is $5,183.35. Will initiate patient assistance.

## 2019-12-31 ENCOUNTER — Encounter: Payer: Self-pay | Admitting: Thoracic Surgery (Cardiothoracic Vascular Surgery)

## 2019-12-31 ENCOUNTER — Encounter: Payer: Self-pay | Admitting: *Deleted

## 2019-12-31 ENCOUNTER — Other Ambulatory Visit: Payer: Self-pay

## 2019-12-31 ENCOUNTER — Ambulatory Visit (INDEPENDENT_AMBULATORY_CARE_PROVIDER_SITE_OTHER): Payer: Self-pay | Admitting: Thoracic Surgery (Cardiothoracic Vascular Surgery)

## 2019-12-31 ENCOUNTER — Ambulatory Visit
Admission: RE | Admit: 2019-12-31 | Discharge: 2019-12-31 | Disposition: A | Discharge status: Home / Self Care | Payer: Medicare Other | Source: Ambulatory Visit | Attending: Thoracic Surgery (Cardiothoracic Vascular Surgery) | Admitting: Thoracic Surgery (Cardiothoracic Vascular Surgery)

## 2019-12-31 VITALS — BP 176/84 | HR 67 | Temp 97.3°F | Resp 18 | Ht 67.0 in | Wt 194.0 lb

## 2019-12-31 DIAGNOSIS — R918 Other nonspecific abnormal finding of lung field: Secondary | ICD-10-CM | POA: Diagnosis not present

## 2019-12-31 DIAGNOSIS — J849 Interstitial pulmonary disease, unspecified: Secondary | ICD-10-CM

## 2019-12-31 DIAGNOSIS — Z9889 Other specified postprocedural states: Secondary | ICD-10-CM

## 2019-12-31 DIAGNOSIS — M47814 Spondylosis without myelopathy or radiculopathy, thoracic region: Secondary | ICD-10-CM | POA: Diagnosis not present

## 2019-12-31 DIAGNOSIS — J9811 Atelectasis: Secondary | ICD-10-CM | POA: Diagnosis not present

## 2019-12-31 NOTE — Telephone Encounter (Signed)
[place] forms in. And placed [facility] form in provider's box. Will update once we receive a response.  Phone# [phone]

## 2019-12-31 NOTE — Progress Notes (Signed)
[address]       [place] [address]             [phone]     HPI: Susan Hunt returns for scheduled follow-up visit  Susan Hunt is a 74 year old woman with history of hypertension, hyperlipidemia, asthma, obesity, hypothyroidism, depression, nonischemic cardiomyopathy, and interstitial lung disease. She underwent a robotic assisted lung biopsy on 12/13/2019. She did well postoperatively and went home on 12/16/2019. Pathology was consistent with UIP.  Since discharge she has been doing well. She is not taking any oxycodone at this point. She did see Dr. Chase Caller. He is starting her on a new medication for the ILD. They are waiting for that to come from a specialty pharmacy. She is planning to go to the beach tomorrow. She does complain of feeling fatigued.  Past Medical History:  Diagnosis Date  . Anemia   . Asthma    "I dont think I have it.  . CHF (congestive heart failure) ([place])   . Depression   . Emphysema of lung ([place])   . GERD (gastroesophageal reflux disease)   . Hemorrhoids   . History of kidney stones    Cystoscopy  . Hyperlipidemia   . Hypertension   . Hypothyroidism   . Obesity   . Pre-diabetes   . Rosacea   . Tubular adenoma of colon 03/26/2019   Colonoscopy repeat in 3 years    Current Outpatient Medications  Medication Sig Dispense Refill  . acetaminophen (TYLENOL) 500 MG tablet Take 2 tablets (1,000 mg total) by mouth every 6 (six) hours as needed. 30 tablet 0  . cephALEXin (KEFLEX) 250 MG capsule Take 250 mg by mouth at bedtime.     . cetirizine (ZYRTEC) 10 MG tablet Take 10 mg by mouth daily.    . diphenhydrAMINE (BENADRYL) 25 mg capsule Take 25 mg by mouth at bedtime.    Marland Kitchen FARXIGA 10 MG TABS tablet Take 10 mg by mouth daily.    . fluticasone (FLONASE) 50 MCG/ACT nasal spray Place 2 sprays into both nostrils at bedtime as needed (allergies.).     Marland Kitchen levothyroxine (SYNTHROID) 125 MCG tablet Take 1 tablet (125 mcg total) by  mouth daily. 90 tablet 0  . metoprolol succinate (TOPROL XL) 50 MG 24 hr tablet Take 1 tablet (50 mg total) by mouth daily. 90 tablet 3  . montelukast (SINGULAIR) 10 MG tablet TAKE ONE TABLET BY MOUTH EVERY NIGHT AT BEDTIME (Patient taking differently: Take 10 mg by mouth at bedtime. ) 90 tablet 1  . Multiple Vitamin (MULTIVITAMIN WITH MINERALS) TABS tablet Take 1 tablet by mouth daily.    Marland Kitchen omeprazole (PRILOSEC) 20 MG capsule TAKE ONE CAPSULE BY MOUTH DAILY 90 capsule 3  . oxyCODONE (OXY IR/ROXICODONE) 5 MG immediate release tablet Take 1 tablet (5 mg total) by mouth every 4 (four) hours as needed for severe pain. 30 tablet 0  . rosuvastatin (CRESTOR) 10 MG tablet Take 1 tablet (10 mg total) by mouth daily. 90 tablet 3  . sacubitril-valsartan (ENTRESTO) 49-51 MG Take 1 tablet by mouth 2 (two) times daily. 60 tablet 11  . venlafaxine XR (EFFEXOR-XR) 150 MG 24 hr capsule Take 1 capsule (150 mg total) by mouth daily. 90 capsule 3   No current facility-administered medications for this visit.    Physical Exam BP (!) 176/84 (BP Location: Right Arm, Patient Position: Sitting)   Pulse 67   Temp (!) 97.3 F (36.3 C)  Resp 18   Ht _0  (1.702 m)   Wt 194 lb (88 kg)   SpO2 93% Comment: RA with mask on  BMI 30.54 kg/m  74 year old woman in no acute distress Alert oriented x3 with no focal deficits Lungs faint crackles at bases, no wheezing Incisions well-healed Cardiac regular rate and rhythm No peripheral edema  Diagnostic Tests: CHEST - 2 VIEW  COMPARISON:  Chest x-ray 12/15/2019, 10/15/2019.  CT 11/29/2019.  FINDINGS: Heart size stable. Surgical sutures right upper lung again noted. Mild atelectasis/infiltrate right lung base. Diffuse chronic bilateral interstitial prominence again noted. No pleural effusion or pneumothorax. Degenerative change thoracic spine. No acute bony abnormality.  IMPRESSION: 1.  Mild atelectasis/infiltrate right lung base.  2. Surgical sutures  right upper lung again noted. Chronic interstitial lung disease again noted.   Electronically Signed   By: Marcello Moores  Register   On: 12/31/2019 09:49      Impression: Susan Hunt is a 74 year old woman with interstitial lung disease. She had a robotic assisted lung biopsy on 12/13/2019. She is now a little less than 3 weeks out from surgery and is doing well.  She is not taking any narcotics at this point.  She does have some fatigue. That is common after surgery. Usually around 4 to 6 weeks that will resolve.  Dr. Chase Caller is managing her interstitial lung disease.  From my standpoint there are no restrictions on her activities, but she was advised to build into new activities gradually.  Plan: Follow-up with Dr. Chase Caller I will be happy to see Susan Hunt back anytime in the future if I can be of any further assistance with her care  Melrose Nakayama, MD [facility] [phone]

## 2020-01-02 NOTE — Telephone Encounter (Signed)
Error.

## 2020-01-07 ENCOUNTER — Ambulatory Visit: Payer: Medicare Other | Attending: Internal Medicine

## 2020-01-07 DIAGNOSIS — Z23 Encounter for immunization: Secondary | ICD-10-CM

## 2020-01-07 NOTE — Progress Notes (Signed)
   Covid-19 Vaccination Clinic  Name:  Susan Hunt    MRN: [id number] DOB: 07-07-1945  01/07/2020  [name] was observed post Covid-19 immunization for 15 minutes without incident. She was provided with Vaccine Information Sheet and instruction to access the V-Safe system.   [name] was instructed to call 911 with any severe reactions post vaccine: Marland Kitchen Difficulty breathing  . Swelling of face and throat  . A fast heartbeat  . A bad rash all over body  . Dizziness and weakness

## 2020-01-07 NOTE — Telephone Encounter (Signed)
Dr. Chase Caller please advise on patient mychart message, keep an eye out for form please  [employer] Amy will be sending a prescribed foundation form to be filled.out by your office.  Could you.please get that completed and returned.to them.  Their phone no.  [phone] if you.have any questions.  Can you let me know when form has been returned so I may contact them Thank you Enriqueta Shutter.

## 2020-01-07 NOTE — Telephone Encounter (Signed)
Received fax from [place] that patient has been triaged to [employer] and [employer] foundation for copay assistance. Will follow up on patient's shipment.  Phone# [phone] or [phone]

## 2020-01-08 NOTE — Telephone Encounter (Signed)
Rachael, Have you seen a fax from [employer] regarding [place] for Dr. Chase Caller to fill out?  I have looked everywhere and I do not see the form.  Thank you.

## 2020-01-08 NOTE — Telephone Encounter (Signed)
Received fax form [employer] Amy that patient was denied from [facility] foundation and will now qualify for patient assistance through them. Placed form with provider for signature. Will fax once signed.

## 2020-01-08 NOTE — Telephone Encounter (Signed)
Faxed Provider foundation form for [employer] to [place]. Will update once we receive a response.  Phone# [phone]

## 2020-01-09 ENCOUNTER — Telehealth: Payer: Self-pay | Admitting: Pharmacist

## 2020-01-09 DIAGNOSIS — I5020 Unspecified systolic (congestive) heart failure: Secondary | ICD-10-CM

## 2020-01-09 NOTE — Telephone Encounter (Addendum)
Called and spoke with patient. She states that her BP has been high since Oct 1. Although it was pretty good at visit on 10/12, its been high since. Patient thinks Entresto isn't working well. However, when [name] was started her maxzide, amlodipine and losartan were all stopped to avoid hypotension. However, less than expected blood pressure lowering has been seen with Entresto. Due to its great benefit in HF, will continue Entresto and increase to 97/121m BID. Pt will take 2 of the 49/590mtablets BID. Will follow up on 11/9 in clinic for BP check and BMP. If BP is still high- will look at adding spironolactone for CHF benefit and BP lowering.

## 2020-01-09 NOTE — Telephone Encounter (Signed)
I spoke to patient via phone yesterday. Form was signed and faxed back to [place]. Noted in previous encounter. Nothing further needed. Thanks!

## 2020-01-09 NOTE — Addendum Note (Signed)
Addended by: Marcelle Overlie D on: 01/09/2020 01:24 PM   Modules accepted: Orders

## 2020-01-09 NOTE — Telephone Encounter (Signed)
Patient is seeing Pharmacy on 01/21/20 for HF and possibly titration. Will forward to pharmacy to see if they can help.

## 2020-01-13 LAB — FUNGUS CULTURE WITH STAIN

## 2020-01-13 LAB — FUNGUS CULTURE RESULT

## 2020-01-13 LAB — FUNGAL ORGANISM REFLEX

## 2020-01-14 NOTE — Telephone Encounter (Signed)
Received a fax from [place] regarding an approval for [place] patient assistance from 01/10/20 until further notice.   Phone number: [phone]

## 2020-01-14 NOTE — Telephone Encounter (Signed)
Ok thanks. Looks all taken care of

## 2020-01-15 ENCOUNTER — Ambulatory Visit: Payer: Medicare Other

## 2020-01-16 DIAGNOSIS — J8489 Other specified interstitial pulmonary diseases: Secondary | ICD-10-CM | POA: Diagnosis not present

## 2020-01-20 NOTE — Progress Notes (Signed)
Patient ID: Susan Hunt                 DOB: 1946-02-26                      MRN: [id number]     HPI: Susan Hunt is a 74 y.o. female referred by Dr. Shanda Bumps HFclinic.PMH is significant for HFrEF (LVEF 35-45% 11/01/19), NICM with normal R and L heart pressures, HTN, HLD, and interstitial lung disease with pulmonary fibrosis.  On 01/09/20, patient reported BP has been running higher than normal, SBP ranging 160-180 and hardly below 150 on Entresto 49/59m - 2 tablets BID. Now she has stopped Entresto Hunt to no improvements in BP. States BP was lower before starting Entresto. Patient was previously on Maxzide, amlodipine and losartan which were all stopped to avoid hypotension with starting Entresto as her BP readings were previously lower.   Patient presents today in good spirits with her husband. Reports stopping Entresto 49/51 mg - 2 tablets BID on 01/11/20 Hunt to minimum improvement in BP and reemergence of dry/itchy mouth, dry eyes with no improvement in symptoms with benadryl. Reports symptoms improved once stopping Entresto. Patient reports on 01/12/20, she self-resumed Maxzide 37.5/25 mg daily (AM), amlodipine 5 mg daily (AM), and losartan 100 mg daily (AM) because of elevated home BP readings. Reports home BP readings have improved since restarting Maxzide, amlodipine and losartan. BP averaging 140-149s/60-80s with high of 159/90. Denies dizziness, headaches, blurred vision, and LE swelling. Reports medication adherence with Torpol XL and Farxiga. Reports now in donut hole and cannot afford [place] requested patient assistance.  Current CHF meds:   Entresto 49/51 mg - 2 tablets BID (not taking)  Metoprolol succinate 50 mg daily (AM)  Farxiga 10 mg daily (AM)  Maxzide 37.5/25 mg daily (AM) (self-restarted)  Amlodipine 5 mg daily (AM) (self-restarted)  Losartan 100 mg daily (AM) (self-restarted)  Previously tried: lisinopril-hctz (SOB, coughing), metoprolol tartrate  (SOB, coughing), clonidine, triamterene-HCTZ, losartan, amlodipine  BP goal: <130/80 mmHg  Family History: Alcohol abuse in her father; Diabetes in her maternal grandmother; Drug abuse in her son; Heart disease in her mother; Hypertension in her mother; Osteoporosis in her mother; Stroke in her mother  Social History:2-3 mixed drinks weekly, former smoker (quit 10 years ago)  Diet:Does not add salt to food or cook with salt. Enjoys eating tomato-based foods. Does not eat breakfast. Eats lunch/dinner at 3-4 pm daily. At an egg salad sandwich yesterday. Eats out at restaurants often ([place] but avoids fast food.  Exercise:limited Hunt to dyspnea -walksdog (halfway around block)  Home BP readings:  159/90, 149/81, 137/85, 146/67, 144/92, 132/67, 148/76, 147/78  Wt Readings from Last 3 Encounters:  01/21/20 196 lb (88.9 kg)  12/31/19 194 lb (88 kg)  12/27/19 197 lb 12.8 oz (89.7 kg)   BP Readings from Last 3 Encounters:  01/21/20 140/76  12/31/19 (!) 176/84  12/27/19 (!) 144/86   Pulse Readings from Last 3 Encounters:  01/21/20 77  12/31/19 67  12/27/19 64    Renal function: CrCl cannot be calculated (Patient's most recent lab result is older than the maximum 21 days allowed.).  Past Medical History:  Diagnosis Date  . Anemia   . Asthma    "I dont think I have it.  . [place]) ([place]   . Depression   . Emphysema of lung ([place]   . GERD (gastroesophageal reflux disease)   . Hemorrhoids   .  History of kidney stones    Cystoscopy  . Hyperlipidemia   . Hypertension   . Hypothyroidism   . Obesity   . Pre-diabetes   . Rosacea   . Tubular adenoma of colon 03/26/2019   Colonoscopy repeat in 3 years    Current Outpatient Medications on File Prior to Visit  Medication Sig Dispense Refill  . acetaminophen (TYLENOL) 500 MG tablet Take 2 tablets (1,000 mg total) by mouth every 6 (six) hours as needed. 30 tablet 0  . cephALEXin (KEFLEX) 250  MG capsule Take 250 mg by mouth at bedtime.     . cetirizine (ZYRTEC) 10 MG tablet Take 10 mg by mouth daily.    . diphenhydrAMINE (BENADRYL) 25 mg capsule Take 25 mg by mouth at bedtime.    Marland Kitchen FARXIGA 10 MG TABS tablet Take 10 mg by mouth daily.    . fluticasone (FLONASE) 50 MCG/ACT nasal spray Place 2 sprays into both nostrils at bedtime as needed (allergies.).     Marland Kitchen levothyroxine (SYNTHROID) 125 MCG tablet Take 1 tablet (125 mcg total) by mouth daily. 90 tablet 0  . metoprolol succinate (TOPROL XL) 50 MG 24 hr tablet Take 1 tablet (50 mg total) by mouth daily. 90 tablet 3  . montelukast (SINGULAIR) 10 MG tablet TAKE ONE TABLET BY MOUTH EVERY NIGHT AT BEDTIME (Patient taking differently: Take 10 mg by mouth at bedtime. ) 90 tablet 1  . Multiple Vitamin (MULTIVITAMIN WITH MINERALS) TABS tablet Take 1 tablet by mouth daily.    Marland Kitchen omeprazole (PRILOSEC) 20 MG capsule TAKE ONE CAPSULE BY MOUTH DAILY 90 capsule 3  . oxyCODONE (OXY IR/ROXICODONE) 5 MG immediate release tablet Take 1 tablet (5 mg total) by mouth every 4 (four) hours as needed for severe pain. 30 tablet 0  . rosuvastatin (CRESTOR) 10 MG tablet Take 1 tablet (10 mg total) by mouth daily. 90 tablet 3  . sacubitril-valsartan (ENTRESTO) 49-51 MG Take 2 tablets by mouth 2 (two) times daily. 60 tablet 11  . venlafaxine XR (EFFEXOR-XR) 150 MG 24 hr capsule Take 1 capsule (150 mg total) by mouth daily. 90 capsule 3   No current facility-administered medications on file prior to visit.    Allergies  Allergen Reactions  . Ace Inhibitors Cough  . Clarithromycin     REACTION: GI sx  . Codeine Nausea Only  . Telmisartan Other (See Comments)    sleepy     Assessment/Plan:  1. CHF - BP above goal <130/80 mmHg. Patient stopped Entresto Hunt to reemergence of dry/itchy mouth and dry eyes with no relief with Benadryl and stated Entresto was not lowering her BP efficiently. Patient was adamant that she did not want to restart Entresto and may  consider restarting in the future. Will discontinue Entresto today and revisit in the future. Hunt to elevated home BP readings, patient self-restarted Maxzide, amlodipine and losartan and saw a modest reduction in BP. Will plan to discontinue losartan and start valsartan 320 mg daily for additional BP lowering pending BMET labs drawn today. Continued Toprol XL 50 mg daily, Farxiga 10 mg daily, amlodipine 5 mg daily and maxzide 37.5/25 mg daily. Additionally, patient reports she is now in the donut hole and cannot afford copay cost. Patient filled [place] patient assistance application for Farxiga 10 mg daily in clinic and will fax document. Follow-up visit scheduled in 2 weeks for HF management and labs.  Lorel Monaco, PharmD PGY2 Ambulatory Care Resident [place]

## 2020-01-21 ENCOUNTER — Encounter: Payer: Self-pay | Admitting: Pharmacist

## 2020-01-21 ENCOUNTER — Ambulatory Visit: Payer: Medicare Other

## 2020-01-21 ENCOUNTER — Ambulatory Visit (INDEPENDENT_AMBULATORY_CARE_PROVIDER_SITE_OTHER): Payer: Medicare Other | Admitting: Pharmacist

## 2020-01-21 ENCOUNTER — Other Ambulatory Visit: Payer: Self-pay

## 2020-01-21 VITALS — BP 140/76 | HR 77 | Wt 196.0 lb

## 2020-01-21 DIAGNOSIS — I10 Essential (primary) hypertension: Secondary | ICD-10-CM | POA: Diagnosis not present

## 2020-01-21 DIAGNOSIS — I5020 Unspecified systolic (congestive) heart failure: Secondary | ICD-10-CM | POA: Diagnosis not present

## 2020-01-21 MED ORDER — AMLODIPINE BESYLATE 5 MG PO TABS: 5.0000 mg | ORAL_TABLET | Freq: Every day | ORAL | 3 refills | Status: DC

## 2020-01-21 MED ORDER — TRIAMTERENE-HCTZ 37.5-25 MG PO TABS: 1.0000 | ORAL_TABLET | Freq: Every day | ORAL | 11 refills | Status: DC

## 2020-01-21 NOTE — Patient Instructions (Addendum)
It was nice to see you today!  Your goal blood pressure is <130/80 mmHg. In clinic, your blood pressure was 140/76 mmHg.  Medication Changes: I will give you a call regarding labs, if normal, you will stop taking losartan and start taking Valsartan 320 mg daily.  Continue   Metoprolol succinate (Toprol XL) 50 mg daily   Farxiga 10 mg daily  Amlodipine 5 mg daily  Maxzide 37.5/25 mg daily  Discontinue   Losartan  Entresto   Monitor blood pressure at home daily and keep a log (on your phone or piece of paper) to bring with you to your next visit. Write down date, time, blood pressure and pulse.  Keep up the good work with diet and exercise. Aim for a diet full of vegetables, fruit and lean meats (chicken, [place], fish). Try to limit salt intake by eating fresh or frozen vegetables (instead of canned), rinse canned vegetables prior to cooking and do not add any additional salt to meals.   Please give [place] a call at [phone] with any questions or concerns.

## 2020-01-22 ENCOUNTER — Telehealth: Payer: Self-pay | Admitting: Pharmacist

## 2020-01-22 ENCOUNTER — Other Ambulatory Visit: Payer: Self-pay | Admitting: Family Medicine

## 2020-01-22 DIAGNOSIS — I10 Essential (primary) hypertension: Secondary | ICD-10-CM

## 2020-01-22 DIAGNOSIS — I5020 Unspecified systolic (congestive) heart failure: Secondary | ICD-10-CM

## 2020-01-22 LAB — BASIC METABOLIC PANEL
BUN/Creatinine Ratio: 20 (ref 12–28)
BUN: 21 mg/dL (ref 8–27)
CO2: 20 mmol/L (ref 20–29)
Calcium: 9.7 mg/dL (ref 8.7–10.3)
Chloride: 101 mmol/L (ref 96–106)
Creatinine, Ser: 1.03 mg/dL — ABNORMAL HIGH (ref 0.57–1.00)
GFR calc Af Amer: 61 mL/min/{1.73_m2} (ref >59)
GFR calc non Af Amer: 53 mL/min/{1.73_m2} — ABNORMAL LOW (ref >59)
Glucose: 89 mg/dL (ref 65–99)
Potassium: 4.5 mmol/L (ref 3.5–5.2)
Sodium: 138 mmol/L (ref 134–144)

## 2020-01-22 MED ORDER — VALSARTAN 320 MG PO TABS: 320.0000 mg | ORAL_TABLET | Freq: Every day | ORAL | 3 refills | Status: DC

## 2020-01-22 NOTE — Telephone Encounter (Signed)
Contacted patient regarding HF/HTN management. Informed patient renal function and electrolytes are within normal limits and will plan to start Valsartan 320 mg daily. Patient verbalized understanding. Prescription sent to [facility]. All questions were answered.

## 2020-01-27 LAB — ACID FAST CULTURE WITH REFLEXED SENSITIVITIES (MYCOBACTERIA): Acid Fast Culture: NEGATIVE

## 2020-02-03 ENCOUNTER — Other Ambulatory Visit: Payer: Self-pay | Admitting: *Deleted

## 2020-02-03 MED ORDER — ESBRIET 267 MG PO TABS: 3.0000 | ORAL_TABLET | Freq: Three times a day (TID) | ORAL | 5 refills | Status: DC

## 2020-02-03 NOTE — Progress Notes (Signed)
Patient ID: [name]                 DOB: 09/24/1945                      MRN: [id number]     HPI: Susan Hunt is a 74 y.o. female referred by Dr. Sh[facility].PMH is significant for HFrEF (LVEF35-45%11/01/19), NICM with normal R and L heart pressures, HTN, HLD, and interstitial lung disease with pulmonary fibrosis.  Patient last seen by clinical pharmacist on 01/21/20 in which BP 140/76 was above goal. Susan Hunt was discontinued due to dry/itchy mouth and dry eyes, losartan was discontinued and valsartan 320 mg daily was initiated. Patient restarted Maxzide 37.5/25 mg daily and amlodipine 5 mg daily on her own. Additionally, [place] patient assistance application was faxed due to expensive copay cost (in donut hole).  Patient presents today in good spirits accompanied with her husband. Reports medication adherence and no side-effects with all HTN/CHF medications. Denies dizziness, headaches, blurred vision, and swelling. BP readings have improved ranging 120-130s/70-80s with one high of 144/73; HR 70s.   Current CHF/HTN meds:   Metoprolol succinate 50 mg daily(AM)  Farxiga10 mg daily (AM)  Maxzide 37.5/25 mg daily (AM)  Amlodipine 5 mg daily (AM)   Valsartan 320 mg daily (AM)   Previously tried: Entresto 49/51 mg (dry/itchy mouth, dry eyes), lisinopril-hctz (SOB, coughing), metoprolol tartrate (SOB, coughing), clonidine  BP goal: <130/80 mmHg  Family History: Alcohol abuse in her father; Diabetes in her maternal grandmother; Drug abuse in her son; Heart disease in her mother; Hypertension in her mother; Osteoporosis in her mother; Stroke in her mother  Social History:2-3 mixed drinks weekly, former smoker (quit 10 years ago)  Diet:Does not add salt to food or cook with salt. Enjoys eating tomato-based foods. Does not eat breakfast. Eats lunch/dinner at 3-4 pm daily. At an egg salad sandwich yesterday. Eats out at restaurants often ([employer]. [employer]) but  avoids fast food.  Exercise:limited due to dyspnea -walksdog (halfway around block)  Home BP readings:  BP 133/95, 121/80, 125/87, 135/70, 144/73, 120/72 HR: 70s  Wt Readings from Last 3 Encounters:  02/04/20 197 lb (89.4 kg)  02/04/20 196 lb 6.4 oz (89.1 kg)  01/21/20 196 lb (88.9 kg)   BP Readings from Last 3 Encounters:  02/04/20 122/72  02/04/20 120/72  01/21/20 140/76   Pulse Readings from Last 3 Encounters:  02/04/20 76  02/04/20 86  01/21/20 77    Renal function: Estimated Creatinine Clearance: 56.2 mL/min (A) (by C-G formula based on SCr of 1.03 mg/dL (H)).  Past Medical History:  Diagnosis Date  . Anemia   . Asthma    "I dont think I have it.  . CHF (congestive heart failure) ([place])   . Depression   . Emphysema of lung ([place])   . GERD (gastroesophageal reflux disease)   . Hemorrhoids   . History of kidney stones    Cystoscopy  . Hyperlipidemia   . Hypertension   . Hypothyroidism   . Obesity   . Pre-diabetes   . Rosacea   . Tubular adenoma of colon 03/26/2019   Colonoscopy repeat in 3 years    Current Outpatient Medications on File Prior to Visit  Medication Sig Dispense Refill  . acetaminophen (TYLENOL) 500 MG tablet Take 2 tablets (1,000 mg total) by mouth every 6 (six) hours as needed. 30 tablet 0  . amLODipine (NORVASC) 5 MG tablet Take 1 tablet (5  mg total) by mouth daily. 90 tablet 3  . cephALEXin (KEFLEX) 250 MG capsule Take 250 mg by mouth at bedtime.     . cetirizine (ZYRTEC) 10 MG tablet Take 10 mg by mouth daily.    Marland Kitchen FARXIGA 10 MG TABS tablet Take 10 mg by mouth daily.    . fluticasone (FLONASE) 50 MCG/ACT nasal spray Place 2 sprays into both nostrils at bedtime as needed (allergies.).     Marland Kitchen levothyroxine (SYNTHROID) 125 MCG tablet Take 1 tablet (125 mcg total) by mouth daily. 90 tablet 0  . montelukast (SINGULAIR) 10 MG tablet TAKE ONE TABLET BY MOUTH EVERY NIGHT AT BEDTIME 90 tablet 1  . Multiple Vitamin (MULTIVITAMIN WITH MINERALS)  TABS tablet Take 1 tablet by mouth daily.    Marland Kitchen omeprazole (PRILOSEC) 20 MG capsule TAKE ONE CAPSULE BY MOUTH DAILY 90 capsule 3  . oxyCODONE (OXY IR/ROXICODONE) 5 MG immediate release tablet Take 1 tablet (5 mg total) by mouth every 4 (four) hours as needed for severe pain. 30 tablet 0  . Pirfenidone (ESBRIET) 267 MG TABS Take 3 tablets (801 mg total) by mouth in the morning, at noon, and at bedtime. 270 tablet 5  . rosuvastatin (CRESTOR) 10 MG tablet Take 1 tablet (10 mg total) by mouth daily. 90 tablet 3  . valsartan (DIOVAN) 320 MG tablet Take 1 tablet (320 mg total) by mouth daily. 90 tablet 3  . venlafaxine XR (EFFEXOR-XR) 150 MG 24 hr capsule Take 1 capsule (150 mg total) by mouth daily. 90 capsule 3   No current facility-administered medications on file prior to visit.    Allergies  Allergen Reactions  . Ace Inhibitors Cough  . Clarithromycin     REACTION: GI sx  . Codeine Nausea Only  . Telmisartan Other (See Comments)    sleepy     Assessment/Plan:  1. CHF - BP at goal <130/80 mmHg and home BP readings improving. Medication adherence appears optimal with all HTN/HF medications. Will increase Toprol XL to 100 mg daily, start spironolactone 25 mg daily pending BMET labs drawn today, and discontinue Maxzide to further optimize CHF regimen. Continued Farxiga10 mg daily and amlodipine 5 mg daily. Additionally, Farxiga AZ&Me application approved and medication will be mailed to patient within 1-2 weeks. Follow-up visit scheduled in 2 weeks for HF/HTN management and labs.  Lorel Monaco, PharmD PGY2 [facility] [address], [place], [place] [address] Phone: [phone]; Fax: [phone]

## 2020-02-04 ENCOUNTER — Encounter: Payer: Self-pay | Admitting: Pharmacist

## 2020-02-04 ENCOUNTER — Ambulatory Visit (INDEPENDENT_AMBULATORY_CARE_PROVIDER_SITE_OTHER): Payer: Medicare Other | Admitting: Pharmacist

## 2020-02-04 ENCOUNTER — Telehealth: Payer: Self-pay | Admitting: Internal Medicine

## 2020-02-04 ENCOUNTER — Encounter: Payer: Self-pay | Admitting: Internal Medicine

## 2020-02-04 ENCOUNTER — Other Ambulatory Visit: Payer: Self-pay

## 2020-02-04 ENCOUNTER — Other Ambulatory Visit: Payer: Medicare Other | Admitting: *Deleted

## 2020-02-04 ENCOUNTER — Ambulatory Visit: Payer: Medicare Other | Admitting: Internal Medicine

## 2020-02-04 VITALS — BP 120/72 | HR 86 | Temp 97.7°F | Ht 69.0 in | Wt 196.4 lb

## 2020-02-04 VITALS — BP 122/72 | HR 76 | Wt 197.0 lb

## 2020-02-04 DIAGNOSIS — I5020 Unspecified systolic (congestive) heart failure: Secondary | ICD-10-CM | POA: Diagnosis not present

## 2020-02-04 DIAGNOSIS — J84112 Idiopathic pulmonary fibrosis: Secondary | ICD-10-CM | POA: Diagnosis not present

## 2020-02-04 DIAGNOSIS — Z5181 Encounter for therapeutic drug level monitoring: Secondary | ICD-10-CM

## 2020-02-04 LAB — HEPATIC FUNCTION PANEL
ALT: 22 U/L (ref 0–35)
AST: 30 U/L (ref 0–37)
Albumin: 4.2 g/dL (ref 3.5–5.2)
Alkaline Phosphatase: 112 U/L (ref 39–117)
Bilirubin, Direct: 0.1 mg/dL (ref 0.0–0.3)
Total Bilirubin: 0.6 mg/dL (ref 0.2–1.2)
Total Protein: 7.4 g/dL (ref 6.0–8.3)

## 2020-02-04 MED ORDER — METOPROLOL SUCCINATE ER 100 MG PO TB24: 50.0000 mg | ORAL_TABLET | Freq: Every day | ORAL | 3 refills | Status: DC

## 2020-02-04 NOTE — Progress Notes (Signed)
OV 08/19/2019  Subjective:  Patient ID: Susan Hunt, female , DOB: 1945/10/30 , age 74 y.o. , MRN: [id number] , ADDRESS: [place] [id number]   08/19/2019 -   Chief Complaint  Patient presents with   Consult    COPD, emphysema.  sob with exertion.  dry coughs alot at night     HPI Susan Hunt 74 y.o. -she is a retired Marine scientist.  She used to work at the [employer] across [place] and having retired from that 10 years ago and then she worked for another agency apparently having retired from all duties few years ago.  Husband is a retired Software engineer.  They are here for shortness of breath.  She tells me that she has had insidious onset of shortness of breath and cough that started 1 year ago and has been progressive.  In the same time she is also been on nitrofurantoin for times.  First 3 of these were 1 week each.  The last 1 being for at least 6 weeks ending approximately 4-6 weeks ago.  Because of progressive symptoms she was given Stiolto but this did not help.  She has been on longstanding Singulair for several years independent of these problems.  This because of allergies.  Therefore she has been referred here.  She did have a chest x-ray that on my personal visualization shows ILD changes.  It is documented below.  She did have a CT abdomen lung images that was reported as normal but I am not so sure from a year ago.  She is a previous smoker.  She is also self-referred herself to [facility] on the basis that she think she might have advanced COPD and is looking for a Zephyr valve.  Symptoms started in August 2020.   [facility] Comprehensive ILD Questionnaire  Symptoms:   SYMPTOM SCALE - ILD 08/19/2019   O2 use ra  Shortness of Breath 0 -> 5 scale with 5 being worst (score 6 If unable to do)  At rest 1  Simple tasks - showers, clothes change, eating, shaving 2  Household (dishes, doing bed, laundry) 5  Shopping 5  Walking  level at own pace 2  Walking up Stairs 5  Total (30-36) Dyspnea Score 20  How bad is your cough? x  How bad is your fatigue x  How bad is nausea x  How bad is vomiting?  x  How bad is diarrhea? x  How bad is anxiety? x  How bad is depression x    She does have a cough.  She says the cough started in August 2020 the same for shortness of breath started.  She does cough at night.  She does cough when she lies down and it gets worse.  She does feel a tickle in her throat.  Cough does not affect her voice she does not bring any phlegm.  There is no wheezing.  There is no nausea no vomiting no diarrhea.    Past Medical History :   She does have a histo abdomen ry of asthma for the last few years.  Also COPD not otherwise specified for the last few years.  His acid reflux disease for the last few years.  Diabetes for the last few weeks and thyroid disease for the last few years.  Otherwise denies any collagen vascular disease or vasculitis.  Denies any sleep apnea.  Denies pulmonary hypertension.  Denies stroke  denies pneumonias recurrent pneumonias.  Denies heart disease or pleurisy.   ROS: Positive for fatigue for the last several months.  Arthralgia for the last several months.  Dry eyes for the last several months.  With intermittent nausea for the last few years.  T there is daytime sleepiness for the last several months.     FAMILY HISTORY of LUNG DISEASE:  -Denies any pulmonary fibrosis.  Denies COPD denies asthma in the family.  Denies sarcoid denies cystic fibrosis denies hypersensitive pneumonitis.  Denies any autoimmune disease.   EXPOSURE HISTORY:   -She denies any Covid history.  Denies any exposure to Covid.  She did do Covid vaccine.  No reactions after the Covid vaccine.  Did smoke cigarettes 15-year 2000 and 2015 10 to 12 cigarettes a day.  Did not do any passive smoking.  Never did any marijuana.  Elevated electronic cigarettes.  Never use cocaine never used intravenous drug  use.   HOME and HOBBY DETAILS :  -[place]  Is lived there for 21 years.  Age of the home is 34 years.  In 2004 there was mold/mildew in the shower curtain.  And then there was mold under the shower.  This was torn down.  This was in 2004 but currently none.  She did some mulch back at work 2 months ago.  No humidifier use no CPAP use.  No nebulizer use.  No steam iron use.  No Jacuzzi use.  No misting Fountain in the house.  No pet birds or parakeets no gerbils.  No feather pillows.  No mold in the [facility] duct.  No music habits.   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  No otehrr organic antigen exposure that she recollects (MR side note: a different patient in 2016 reported that in 2013-2016 time lot of renovations there and mold +).  Patient reported on September 10, 2019: That between 1966 and 1980 she worked at [employer] and [place].  At that time there is lot of mold exposure in the building.  The building was cleaned out.  Also checked for inorganic antigen exposure and this is negative.  Never worked in a dusty environment.  Never exposed to fumes.   PULMONARY TOXICITY HISTORY (27 items): Use nitrofurantoin in 2019 and 2020.  This is described below.  Question if you can for this there is      Simple office walk 185 feet x  3 laps goal with forehead probe 08/19/2019   O2 used ra  Number laps completed Attempted 3 but desaturated at 2  Comments about pace Normal pace  Resting Pulse Ox/HR 100% and 101/min  Final Pulse Ox/HR 86% and 116/min  Desaturated </= 88% yes  Desaturated <= 3% points Yes , 14  Got Tachycardic >/= 90/min yes  Symptoms at end of test dyspneic  Miscellaneous comments Corrected with  2L [place]    Nuclear medicine cardiac stress test March 2016: Overall Impression:  Normal stress nuclear study with a small, mild,  fixed apical defect consistent with thinning; no ischemia. LV Wall Motion:  NL LV Function; NL Wall Motion   CT abdomen lung cuts September 2020: Reported as normal.  I personally visualized and retrospect looks hazy and whether this is atelectasis or some other findings I do not know.  Jul 19, 2019:: Personally visualized shows findings of ILD.  I am not able to see a prior chest x-ray in the system or CT scan of the chest    Results for KATALAYA, BEEL "PAM" (MRN [id number]) as of 08/19/2019 15:18  Ref. Range 03/26/2019 13:45  Creatinine Latest Ref Range: 0.40 - 1.20 mg/dL 1.05   ROS - per HPI Results for JOYCELYN, LISKA "PAM" (MRN [id number]) as of 08/19/2019 15:18  Ref. Range 03/26/2019 13:45  Hemoglobin Latest Ref Range: 12.0 - 15.0 g/dL 14.0     OV 09/10/2019  Subjective:  Patient ID: Susan Hunt, female , DOB: March 14, 1946 , age 29 y.o. , MRN: [id number] , ADDRESS: [address] [place] [address]   09/10/2019 -   Chief Complaint  Patient presents with   Follow-up    shortness of breath with exertion and non-productive cough     HPI Susan Hunt 74 y.o. -presents for follow-up to discuss results of dyspnea work-up.  High-resolution CT chest was personally visualized.  It shows presence of interstitial lung disease.  It needs Fleischner criteria for diagnosis not consistent with UIP.  Per ATS this is alternative diagnosis to UIP.  Radiologist raised the concern of hypersensitive pneumonitis but there is no air-trapping.  The other possibilities NSIP.  She is no longer on nitrofurantoin.  In any event unit she only took it intermittently.  Overall she is stable since last visit.  She had autoimmune profile.  Is only trace positive for ANA.  Her IgE is slightly elevated.  Her PFT shows mild restriction with reduction in diffusion capacity.  We went over her exposure history again.  This time she did recollect being exposed to mold for 15 to 20 years up until 1980  while living in [place] and working at [employer].  She does not recollect mold at behavioral [facility] where she worked recently until she retired.  However another patient of mine did recollect mold in this building at [employer].   Low risk cardiac stress test in 2016 but with ejection fraction 54%.-Since then no overall change in health.  Husband inquired about the low ejection fraction.  In terms of symptoms: She is try to get portable oxygen system.  She is switching company to [employer] because of the shortage of portable oxygen systems at [facility].     OV 11/12/2019   Subjective:  Patient ID: Susan Hunt, female , DOB: 1946/02/19, age 33 y.o. years. , MRN: [id number],  ADDRESS: [place] [address] PCP  Leamon Arnt, MD Providers : Treatment Team:  Attending Provider: Brand Males, MD Thoracic surgeon: Dr. Roxan Hockey Cardiologist Dr. Margaretann Loveless  Chief Complaint  Patient presents with   Follow-up    SOB    Follow-up interstitial lung disease not otherwise specified   HPI Susan Hunt 74 y.o. -returns for follow-up.  Her husband is with her.  After her last visit she saw cardiothoracic surgery but we had to hold the surgical lung biopsy because her echocardiogram from September 13, 2019 showed a drop in left ventricular systolic ejection fraction.  Since then she is seen cardiology.  She had a heart catheterization that shows ejection action 40% still low.  Nonobstructive coronary artery disease.  She is followed up with cardiology and she has been started on beta-blocker.  On her father medications was also adjusted.  So far no change in dyspnea.  She tells me overall dyspnea is the same without any change.  Symptom scores shows a two-point shift with  worsening but she told the medical assistant that she is feeling worse.  Walking desaturation test today shows - normalcy .  She wants portable oxygen but the first DME company [facility]  did not have portable oxygen tanks.  She is found another company [employer] that is able to give her portable oxygen.  She needs a requalifying walk that is documented below.  In terms of surgical lung biopsy: She did have some prominent mediastinal nodes.  It appears Dr. Roxan Hockey has informed them that he will do a EBUS for this.  They have also been told by cardiology that from a cardiac standpoint patient would be a suitable candidate for surgical lung biopsy.  The husband wanted to know if a surgical biopsy is indicated and also my perspective and safety.  SYMPTOM SCALE - ILD 08/19/2019  11/12/2019  12/27/2019   O2 use ra ra ra  Shortness of Breath 0 -> 5 scale with 5 being worst (score 6 If unable to do)    At rest 1 1 0  Simple tasks - showers, clothes change, eating, shaving _0 Household (dishes, doing bed, laundry) _1 Shopping _2 Walking level at own pace _3 Walking up Stairs _4 Total (30-36) Dyspnea Score _5 How bad is your cough? x 2 0  How bad is your fatigue x 5 1  How bad is nausea x 1 0  How bad is vomiting?  x 1 0  How bad is diarrhea? x 0 0  How bad is anxiety? x 0 -0  How bad is depression x 0 0     Simple office walk 185 feet x  3 laps goal with forehead probe 08/19/2019  11/12/2019   O2 used ra ra  Number laps completed Attempted 3 but desaturated at 2 Did alll 3 on this repeat walk  Comments about pace Normal pace   Resting Pulse Ox/HR 100% and 101/min 96% and 60/min  Final Pulse Ox/HR 86% and 116/min 94% and 80/min  Desaturated </= 88% yes no  Desaturated <= 3% points Yes , 14 no  Got Tachycardic >/= 90/min yes no  Symptoms at end of test dyspneic mild  Miscellaneous comments Corrected with  2L [place] Will return o2   ROS - per HPI  xxxxxxxxxxxxxxxxxxxxxxxxxx Results for JEZABELLA, SCHRIEVER "PAM" (MRN [id number]) as of 09/10/2019 13:56  Ref. Range 08/23/2019 10:53  FVC-Pre Latest Units: L 2.61  FVC-%Pred-Pre Latest Units: % 80   FEV1-Pre Latest Units: L 2.26  FEV1-%Pred-Pre Latest Units: % 92  Results for DAYLAN, BOGGESS "PAM" (MRN [id number]) as of 09/10/2019 13:56  Ref. Range 08/23/2019 10:53  DLCO cor Latest Units: ml/min/mmHg 13.76  DLCO cor % pred Latest Units: % 64   Results for YULONDA, WHEELING "PAM" (MRN [id number]) as of 09/10/2019 13:56  Ref. Range 08/23/2019 10:53  TLC Latest Units: L 4.15  TLC % pred Latest Units: % 75   xxxxxxxxxxxxxxxxxxxxxx  CT chest high resolution June 2021 Lungs/Pleura: Mild pulmonary fibrosis in a pattern with apical predominance, featuring irregular peripheral interstitial opacity and scattered areas of subpleural bronchiolectasis. Mild, tubular bronchiectasis throughout. No significant air trapping on expiratory phase imaging. No pleural effusion or pneumothorax.  Upper Abdomen: No acute abnormality.  Musculoskeletal: No chest wall mass or suspicious bone lesions identified.  IMPRESSION: 1. Mild pulmonary fibrosis in a pattern with apical predominance, featuring irregular peripheral interstitial opacity and scattered  areas of subpleural bronchiolectasis. Mild, tubular bronchiectasis throughout. Findings are most consistent with an "alternative diagnosis" pattern by ATS pulmonary fibrosis criteria, leading differential considerations chronic fibrotic hypersensitivity pneumonitis or NSIP. 2. Prominent mediastinal lymph nodes, nonspecific and likely reactive. 3. Coronary artery disease.  Aortic Atherosclerosis (ICD10-I70.0).   Electronically Signed   By: Eddie Candle M.D.   On: 09/04/2019 15:37    xxxxxxxxxxxxxxxxxxxxxxxx[id number]  Results for ALEENA, KIRKEBY "PAM" (MRN [id number]) as of 09/10/2019 13:56  Ref. Range 08/19/2019 16:30 08/19/2019 16:30  SEE BELOW Unknown  Comment  Anti Nuclear Antibody (ANA) Latest Ref Range: NEGATIVE  POSITIVE (A)   ANA Pattern 1 Unknown Cytoplasmic (A)   ANA Titer 1 Unknown 1:80 (H) Negative   Angiotensin-Converting Enzyme Latest Ref Range: 9 - 67 U/L 35   Anti JO-1 Latest Ref Range: 0.0 - 0.9 AI  <0.2  CENTROMERE AB SCREEN Latest Ref Range: 0.0 - 0.9 AI  <3.7  Cyclic Citrullin Peptide Ab Latest Units: UNITS <16   dsDNA Ab Latest Ref Range: 0 - 9 IU/mL  5  ENA RNP Ab Latest Ref Range: 0.0 - 0.9 AI  0.6  ENA SSA (RO) Ab Latest Ref Range: 0.0 - 0.9 AI  <0.2  ENA SSB (LA) Ab Latest Ref Range: 0.0 - 0.9 AI  <0.2  Myeloperoxidase Abs Latest Units: AI <1.0   Serine Protease 3 Latest Units: AI <1.0   RA Latex Turbid. Latest Ref Range: <14 IU/mL <14   ENA SM Ab Ser-aCnc Latest Ref Range: 0.0 - 0.9 AI  <0.2  Chromatin Ab SerPl-aCnc Latest Ref Range: 0.0 - 0.9 AI  <0.2     has a past medical history of Asthma, Depression, Emphysema of lung ([place]), Hemorrhoids, Hyperlipidemia, Hypertension, Hypothyroidism, Obesity, Rosacea, and Tubular adenoma of colon (03/26/2019).  IMPRESSIONS   ECHO 09/13/19 1. Left ventricular ejection fraction, by estimation, is 30 to 35%. The  left ventricle has moderately decreased function. The left ventricle  demonstrates global hypokinesis. Apex poorly visualized, consider repeat  limited echo with contrast to rule out  apical thrombus. The left ventricular internal cavity size was moderately  dilated. Left ventricular diastolic parameters are consistent with Grade I  diastolic dysfunction (impaired relaxation).  2. Right ventricular systolic function is normal. The right ventricular  size is normal. Tricuspid regurgitation signal is inadequate for assessing  PA pressure.  3. The mitral valve is normal in structure. Trivial mitral valve  regurgitation.  4. The aortic valve was not well visualized. Aortic valve regurgitation  is not visualized. No aortic stenosis is present.  CATH 8?20/21  Prox LAD to Mid LAD lesion is 30% stenosed.  Ost Cx to Prox Cx lesion is 35% stenosed.  Prox RCA lesion is 35% stenosed.  There is moderate left ventricular  systolic dysfunction.  LV end diastolic pressure is normal.  The left ventricular ejection fraction is 35-45% by visual estimate.   1. Nonobstructive CAD 2. Moderate LV dysfunction. EF estimated at 40% with global hypokinesis 3. Normal LV filling pressures 4. Normal right heart pressures 5. Normal cardiac output.   Plan: medical therapy for LV dysfunction and lipid lowering therapy for nonobstructive CAD.    OV 12/27/2019  Subjective:  Patient ID: Susan Hunt, female , DOB: [birth year] , age 16 y.o. , MRN: [id number] , ADDRESS: [place] [address] PCP Leamon Arnt, MD Patient Care Team: Leamon Arnt, MD as PCP - General (Family Medicine) Elroy Channel, MD as Referring Physician (Internal Medicine)  Parker-Autry, Candace as Consulting Physician (Obstetrics and Gynecology) Trinda Pascal, FNP as Consulting Physician (Urology) Brand Males, MD as Consulting Physician (Pulmonary Disease)  This Provider for this visit: Treatment Team:  Attending Provider: Brand Males, MD    12/27/2019 -   Chief Complaint  Patient presents with   Follow-up    discuss biopsy results. SOB sometimes. denies cough or wheezing.     HPI Susan Hunt 74 y.o. --follow-up interstitial lung disease.  Had surgical biopsy December 13, 2019.  After that she is was in the hospital requiring oxygen but then got discharged without oxygen.  At this point time she is much improved but still having soreness from her chest incision site.  Husband and she are here.  They are concerned about some postoperative atelectasis that was seen.  I visualized the x-ray.  Today walking desaturation test is normal.  I reassured them about this.  But I will give her incentive spirometer.  The main issue is to discuss the biopsy results: The bronchoalveolar lavage shows mixed cellularity with neutrophilia.  The surgical lung biopsy itself shows UIP with just 1 carcinoid tumor.   Reviewing her clinical history this UIP pattern is now consistent with IPF.  I gave her the diagnosis.  Regarding the carcinoid tumor let: I think this is an incidental finding.  There is no  DIPNECH syndrome  Her current symptom and walk profile are stable.   Results for DAQUISHA, CLERMONT "PAM" (MRN [id number]) as of 12/27/2019 12:06  Ref. Range 12/13/2019 08:05  Color, Fluid Latest Ref Range: YELLOW  COLORLESS (A)  Total Nucleated Cell Count, Fluid Latest Ref Range: 0 - 1,000 cu mm 18  Lymphs, Fluid Latest Units: % 25  Eos, Fluid Latest Units: % 8  Appearance, Fluid Latest Ref Range: CLEAR  HAZY (A)  Other Cells, Fluid Latest Units: % MESOTHELIAL CELLS NOTED  Neutrophil Count, Fluid Latest Ref Range: 0 - 25 % 44 (H)  Monocyte-Macrophage-Serous Fluid Latest Ref Range: 50 - 90 % 23 (L)    OV 02/04/2020   Subjective:  Patient ID: Susan Hunt, female , DOB: [birth year], age 18 y.o. years. , MRN: [id number],  ADDRESS: [place] [address] PCP  Leamon Arnt, MD Providers : Treatment Team:  Attending Provider: Brand Males, MD Patient Care Team: Leamon Arnt, MD as PCP - General (Family Medicine) Elroy Channel, MD as Referring Physician (Internal Medicine) Rockey Situ as Consulting Physician (Obstetrics and Gynecology) Trinda Pascal, FNP as Consulting Physician (Urology) Brand Males, MD as Consulting Physician (Pulmonary Disease)    Chief Complaint  Patient presents with   Follow-up    ILD, doing well   Follow-up idiopathic pulmonary fibrosis diagnosed on surgical lung biopsy 12/13/2019.  Diagnosis formally given on 12/27/2019 Commenced pirfenidone late October 2021/early November 2021     HPI Susan Hunt 74 y.o. -returns for follow-up with her husband.  This visit is mainly to focus on pirfenidone uptake.  She has been taking pirfenidone for 3 weeks or more.  She is currently on full dose 3 pills 3 times a day.   The husband wants to know when we would roll to a full largest x1 pill 3 times daily.  I indicated to them that it would be 3 to 4 months of stability with pirfenidone before we made the decision.  Currently she is tolerating pirfenidone well.  She did have some mild nausea and she thinks it is because she did  not take a full meal.  Overall she is handling pirfenidone well without any problems.  No fatigue no weight loss.  Respiratory wise she is stable.  She did get in touch with a local support group.  She is asking about pulmonary rehabilitation referral and she is interested in this.  One of the main issues they wanted to discuss was the fact the pirfenidone refill in the supply chain was not consistent.  There was anxiety that they were not going to get the refill.  She had to call the office.  She believes that it is because she made the call to the office that the refill was sent.  She says she tried to call [place] without any response for a long time.  I have indicated to her that I will ask our pharmacist to review and explained the logistics.  She will have a repeat LFT today    SYMPTOM SCALE - ILD.td  08/19/2019  11/12/2019  12/27/2019 Last Weight  Most recent update: 12/27/2019 11:48 AM    Weight  89.7 kg (197 lb 12.8 oz)            02/04/2020 196#   O2 use ra ra ra ra  Shortness of Breath 0 -> 5 scale with 5 being worst (score 6 If unable to do)     At rest 1 1 0 0  Simple tasks - showers, clothes change, eating, shaving _0 Household (dishes, doing bed, laundry) _1 Shopping _2 Walking level at own pace _3 Walking up Stairs _4 Total (30-36) Dyspnea Score _5 How bad is your cough? x 2 0 0  How bad is your fatigue x _6 How bad is nausea x 1 0 1  How bad is vomiting?  x 1 0 0  How bad is diarrhea? x 0 0 0  How bad is anxiety? x 0 0 0  How bad is depression x 0 0 0     Simple office walk 185 feet x  3 laps goal with forehead  probe 08/19/2019  11/12/2019  12/27/19 BRL 02/04/2020 [place]  O2 used ra ra ra ra  Number laps completed Attempted 3 but desaturated at 2 Did alll 3 on this repeat walk - on beta blocker    Comments about pace Normal pace  Nl pace   Resting Pulse Ox/HR 100% and 101/min 96% and 60/min 94% and 71/min   Final Pulse Ox/HR 86% and 116/min 94% and 80/min 96% and 87/min   Desaturated </= 88% yes no    Desaturated <= 3% points Yes , 14 no    Got Tachycardic >/= 90/min yes no    Symptoms at end of test dyspneic mild none   Miscellaneous comments Corrected with  2L [place] Will return o2 back to dme      PFT Results Latest Ref Rng & Units 08/23/2019  FVC-Pre L 2.61  FVC-Predicted Pre % 80  FVC-Post L 2.57  FVC-Predicted Post % 79  Pre FEV1/FVC % % 87  Post FEV1/FCV % % 92  FEV1-Pre L 2.26  FEV1-Predicted Pre % 92  FEV1-Post L 2.36  DLCO uncorrected ml/min/mmHg 13.84  DLCO UNC% % 65  DLCO corrected ml/min/mmHg 13.76  DLCO COR %Predicted % 64  DLVA Predicted % 88  TLC L 4.15  TLC % Predicted % 75  RV %  Predicted % 63        ROS - per HPI     has a past medical history of Anemia, Asthma, CHF (congestive heart failure) ([place]), Depression, Emphysema of lung ([place]), GERD (gastroesophageal reflux disease), Hemorrhoids, History of kidney stones, Hyperlipidemia, Hypertension, Hypothyroidism, Obesity, Pre-diabetes, Rosacea, and Tubular adenoma of colon (03/26/2019).   reports that she quit smoking about 6 years ago. Her smoking use included cigarettes. She has a 7.50 pack-year smoking history. She has never used smokeless tobacco.  Past Surgical History:  Procedure Laterality Date   ABDOMINAL HYSTERECTOMY     ANTERIOR AND POSTERIOR VAGINAL REPAIR  2020   CARDIAC CATHETERIZATION  11/01/2019   CATARACT EXTRACTION W/ INTRAOCULAR LENS IMPLANT Bilateral 2016   COLONOSCOPY     CYSTOSCOPY     DENTAL SURGERY  2016   Dental Implant   EYE SURGERY Bilateral    cataract   FRACTURE SURGERY  Left 2013   Left ankle   INTERCOSTAL NERVE BLOCK Right 12/13/2019   Procedure: INTERCOSTAL NERVE BLOCK;  Surgeon: Melrose Nakayama, MD;  Location: [facility];  Service: Thoracic;  Laterality: Right;   LUNG BIOPSY Right 12/13/2019   Procedure: LUNG BIOPSY;  Surgeon: Melrose Nakayama, MD;  Location: [place];  Service: Thoracic;  Laterality: Right;   LYMPH NODE BIOPSY Right 12/13/2019   Procedure: LYMPH NODE BIOPSY;  Surgeon: Melrose Nakayama, MD;  Location: [place];  Service: Thoracic;  Laterality: Right;   medtronix nerve stimulator  07/18/2019   Complete InterStim Sacral Neuromudulation system implantation 07/18/19, [facility]   NASAL SINUS SURGERY     RIGHT/LEFT HEART CATH AND CORONARY ANGIOGRAPHY N/A 11/01/2019   Procedure: RIGHT/LEFT HEART CATH AND CORONARY ANGIOGRAPHY;  Surgeon: Martinique, Peter M, MD;  Location: [facility];  Service: Cardiovascular;  Laterality: N/A;   VAGINECTOMY, PARTIAL     VIDEO BRONCHOSCOPY N/A 12/13/2019   Procedure: VIDEO BRONCHOSCOPY;  Surgeon: Melrose Nakayama, MD;  Location: [facility];  Service: Thoracic;  Laterality: N/A;    Allergies  Allergen Reactions   Ace Inhibitors Cough   Clarithromycin     REACTION: GI sx   Codeine Nausea Only   Telmisartan Other (See Comments)    sleepy    Immunization History  Administered Date(s) Administered   Fluad Quad(high Dose 65+) 12/09/2019   Hepatitis A, Adult 04/06/2018   Influenza Split 01/06/2009   Influenza, High Dose Seasonal PF 12/17/2014, 12/20/2015, 01/15/2017, 12/18/2017, 11/28/2018   Influenza, Seasonal, Injecte, Preservative Fre 01/07/2014, 12/17/2014   Influenza,inj,Quad PF,6+ Mos 01/15/2017   Influenza,trivalent, recombinat, inj, PF 12/03/2012   Influenza-Unspecified 01/15/2017   Moderna SARS-COV2 Booster Vaccination 01/07/2020   Moderna SARS-COVID-2 Vaccination 04/17/2019, 05/15/2019   Pneumococcal Conjugate-13 01/07/2014   Pneumococcal Polysaccharide-23 11/23/2011   Td  03/14/2006, 10/16/2011   Zoster 12/06/2013   Zoster Recombinat (Shingrix) 10/21/2017, 12/18/2017    Family History  Problem Relation Age of Onset   Heart disease Mother    Hypertension Mother    Osteoporosis Mother    Stroke Mother    Alcohol abuse Father    Diabetes Maternal Grandmother    Drug abuse Son    Breast cancer Neg Hx      Current Outpatient Medications:    acetaminophen (TYLENOL) 500 MG tablet, Take 2 tablets (1,000 mg total) by mouth every 6 (six) hours as needed., Disp: 30 tablet, Rfl: 0   amLODipine (NORVASC) 5 MG tablet, Take 1 tablet (5 mg total) by mouth daily., Disp: 90 tablet, Rfl: 3  cephALEXin (KEFLEX) 250 MG capsule, Take 250 mg by mouth at bedtime. , Disp: , Rfl:    cetirizine (ZYRTEC) 10 MG tablet, Take 10 mg by mouth daily., Disp: , Rfl:    FARXIGA 10 MG TABS tablet, Take 10 mg by mouth daily., Disp: , Rfl:    fluticasone (FLONASE) 50 MCG/ACT nasal spray, Place 2 sprays into both nostrils at bedtime as needed (allergies.). , Disp: , Rfl:    levothyroxine (SYNTHROID) 125 MCG tablet, Take 1 tablet (125 mcg total) by mouth daily., Disp: 90 tablet, Rfl: 0   metoprolol succinate (TOPROL XL) 50 MG 24 hr tablet, Take 1 tablet (50 mg total) by mouth daily., Disp: 90 tablet, Rfl: 3   montelukast (SINGULAIR) 10 MG tablet, TAKE ONE TABLET BY MOUTH EVERY NIGHT AT BEDTIME, Disp: 90 tablet, Rfl: 1   Multiple Vitamin (MULTIVITAMIN WITH MINERALS) TABS tablet, Take 1 tablet by mouth daily., Disp: , Rfl:    omeprazole (PRILOSEC) 20 MG capsule, TAKE ONE CAPSULE BY MOUTH DAILY, Disp: 90 capsule, Rfl: 3   oxyCODONE (OXY IR/ROXICODONE) 5 MG immediate release tablet, Take 1 tablet (5 mg total) by mouth every 4 (four) hours as needed for severe pain., Disp: 30 tablet, Rfl: 0   Pirfenidone (ESBRIET) 267 MG TABS, Take 3 tablets (801 mg total) by mouth in the morning, at noon, and at bedtime., Disp: 270 tablet, Rfl: 5   rosuvastatin (CRESTOR) 10 MG tablet, Take  1 tablet (10 mg total) by mouth daily., Disp: 90 tablet, Rfl: 3   triamterene-hydrochlorothiazide (MAXZIDE-25) 37.5-25 MG tablet, Take 1 tablet by mouth daily., Disp: 30 tablet, Rfl: 11   valsartan (DIOVAN) 320 MG tablet, Take 1 tablet (320 mg total) by mouth daily., Disp: 90 tablet, Rfl: 3   venlafaxine XR (EFFEXOR-XR) 150 MG 24 hr capsule, Take 1 capsule (150 mg total) by mouth daily., Disp: 90 capsule, Rfl: 3      Objective:   Vitals:   02/04/20 1058  BP: 120/72  Pulse: 86  Temp: 97.7 F (36.5 C)  TempSrc: Oral  SpO2: 96%  Weight: 196 lb 6.4 oz (89.1 kg)  Height: _0  (1.753 m)    Estimated body mass index is 29 kg/m as calculated from the following:   Height as of this encounter: _1  (1.753 m).   Weight as of this encounter: 196 lb 6.4 oz (89.1 kg).  _2 @  Filed Weights   02/04/20 1058  Weight: 196 lb 6.4 oz (89.1 kg)     Physical Exam   General: No distress. Looks well Neuro: Alert and Oriented x 3. GCS 15. Speech normal Psych: Pleasant Resp:  Barrel Chest - no.  Wheeze - no, Crackles - som3, No overt respiratory distress CVS: Normal heart sounds. Murmurs - no Ext: Stigmata of Connective Tissue Disease - no HEENT: Normal upper airway. PEERL +. No post nasal drip        Assessment:       ICD-10-CM   1. IPF (idiopathic pulmonary fibrosis) (HCC)  J84.112 Hepatic function panel    AMB referral to rehabilitation    Hepatic function panel  2. Encounter for therapeutic drug monitoring  Z51.81 Hepatic function panel    AMB referral to rehabilitation    Hepatic function panel       Plan:     Patient Instructions     ICD-10-CM   1. IPF (idiopathic pulmonary fibrosis) ([place])  J84.112   2. Encounter for therapeutic drug monitoring  Z51.81     -  Clinically IPF is stable -Glad you are tolerating pirfenidone well except for very mild nausea  Plan -Continue pirfenidone/Esbriet at full dose of 3 pills 3 times daily  -Apply sunscreen with  this medication, take this 5-6 hours apart, start slowly and work yourself up to 3 pills 3 times daily  -Refer to our pharmacist for overall education about the drug and working with insurance paperwork  - after 3-4 month will consider rolling to large 1 pill three times daily   -Check liver function test today  -Refer to pulmonary rehabilitation  -Glad you are in touch with the local support group  -We will discuss clinical trials as a care option in the future  -We will send a message to the pharmacist to investigate and make sure that you are pirfenidone delivery is in order  Follow-up -In 4 weeks to repeat liver function test -Return to see Dr. Chase Caller in 8 weeks or nurse practitioner in 8 weeks  -30-minute visit; symptom score and walk test at follow-up       SIGNATURE    Dr. Brand Males, M.D., F.C.C.P,  Pulmonary and Critical Care Medicine Staff Physician, [place] Director - Interstitial Lung Disease  Program  Pulmonary [place] at [place], [place], [address]  Pager: [phone], If no answer or between  15:00h - 7:00h: call [phone] Telephone: [phone]  11:49 AM 02/04/2020

## 2020-02-04 NOTE — Patient Instructions (Addendum)
It was nice to see you today!  Your goal blood pressure is 130/80 mmHg. In clinic, your blood pressure was 122/72 mmHg.  Medication Changes: Begin taking metoprolol succinate 100 mg daily  Will call you with results of labs, if normal, will start spironolactone 25 mg daily  Discontinue Maxzide  Monitor blood pressure at home daily and keep a log (on your phone or piece of paper) to bring with you to your next visit. Write down date, time, blood pressure and pulse.  Keep up the good work with diet and exercise. Aim for a diet full of vegetables, fruit and lean meats (chicken, [place], fish). Try to limit salt intake by eating fresh or frozen vegetables (instead of canned), rinse canned vegetables prior to cooking and do not add any additional salt to meals.   Please give [place] a call at [phone] with any questions or concerns.

## 2020-02-04 NOTE — Telephone Encounter (Signed)
[name]/[name] -tells me she had to call [facility] of pharmacy multiple times to ensure that she was going to get her refill of pirfenidone.  She is new to pirfenidone and she is not fully aware of all the logistics.  Plan -Please schedule televisit at the minimum to explain and discuss how specialty medication prescription works -Please ensure that a full authorization and refill is available for her     SIGNATURE    Dr. Brand Males, M.D., F.C.C.P,  Pulmonary and Critical Care Medicine Staff Physician, [place] Director - Interstitial Lung Disease  Program  Pulmonary [place] at [place], [place], [address]  Pager: [phone], If no answer  OR between  19:00-7:00h: page [phone] Telephone (clinical office): [phone] Telephone (research): [phone]  11:44 AM 02/04/2020

## 2020-02-04 NOTE — Patient Instructions (Addendum)
ICD-10-CM   1. IPF (idiopathic pulmonary fibrosis) ([place])  J84.112   2. Encounter for therapeutic drug monitoring  Z51.81     -Clinically IPF is stable -Glad you are tolerating pirfenidone well except for very mild nausea  Plan -Continue pirfenidone/Esbriet at full dose of 3 pills 3 times daily  -Apply sunscreen with this medication, take this 5-6 hours apart, start slowly and work yourself up to 3 pills 3 times daily  -Refer to our pharmacist for overall education about the drug and working with insurance paperwork  - after 3-4 month will consider rolling to large 1 pill three times daily   -Check liver function test today  -Refer to pulmonary rehabilitation  -Glad you are in touch with the local support group  -We will discuss clinical trials as a care option in the future  -We will send a message to the pharmacist to investigate and make sure that you are pirfenidone delivery is in order  Follow-up -In 4 weeks to repeat liver function test -Return to see Dr. Chase Caller in 8 weeks or nurse practitioner in 8 weeks  -30-minute visit; symptom score and walk test at follow-up

## 2020-02-04 NOTE — Telephone Encounter (Signed)
Called and discussed refill process with patient. She says her shipment is scheduled to deliver on this Friday. She will let office know if her shipment does not arrive. Patient is approved for [employer] [facility] patient assistance for Esbriet. The pharmacy to schedule refills is [facility]- phone# [phone]  Thanks! Susan Hunt

## 2020-02-04 NOTE — Telephone Encounter (Signed)
Awesome thanks. WIl close message

## 2020-02-05 ENCOUNTER — Telehealth: Payer: Self-pay | Admitting: Pharmacist

## 2020-02-05 DIAGNOSIS — I5020 Unspecified systolic (congestive) heart failure: Secondary | ICD-10-CM

## 2020-02-05 LAB — BASIC METABOLIC PANEL
BUN/Creatinine Ratio: 17 (ref 12–28)
BUN: 18 mg/dL (ref 8–27)
CO2: 22 mmol/L (ref 20–29)
Calcium: 10 mg/dL (ref 8.7–10.3)
Chloride: 101 mmol/L (ref 96–106)
Creatinine, Ser: 1.05 mg/dL — ABNORMAL HIGH (ref 0.57–1.00)
GFR calc Af Amer: 60 mL/min/{1.73_m2} (ref >59)
GFR calc non Af Amer: 52 mL/min/{1.73_m2} — ABNORMAL LOW (ref >59)
Glucose: 128 mg/dL — ABNORMAL HIGH (ref 65–99)
Potassium: 4.2 mmol/L (ref 3.5–5.2)
Sodium: 139 mmol/L (ref 134–144)

## 2020-02-05 MED ORDER — SPIRONOLACTONE 25 MG PO TABS: 25.0000 mg | ORAL_TABLET | Freq: Every day | ORAL | 3 refills | Status: DC

## 2020-02-05 NOTE — Telephone Encounter (Signed)
Contacted patient regarding HF/HTN management. Informed patient renal function and electrolytes are stable and will start spironolactone 25 mg daily. Patient verbalized understanding. Prescription sent to [facility]. All questions were answered. Follow-up appointment and labs scheduled in two weeks.

## 2020-02-06 NOTE — Progress Notes (Signed)
LFT normal on anti fibrotic. Will not be calling with these reesuls. Pleas make sure repeat one is checked in 4 weeks

## 2020-02-11 ENCOUNTER — Telehealth: Payer: Self-pay | Admitting: Pharmacist

## 2020-02-11 DIAGNOSIS — I5020 Unspecified systolic (congestive) heart failure: Secondary | ICD-10-CM

## 2020-02-11 MED ORDER — METOPROLOL SUCCINATE ER 100 MG PO TB24: 100.0000 mg | ORAL_TABLET | Freq: Every day | ORAL | 3 refills | Status: DC

## 2020-02-11 NOTE — Telephone Encounter (Signed)
Left HIPAA compliant voicemail requesting call back from patient regarding corrected prescription. New prescription of Toprol XL 100 mg daily has been sent to [facility]. Informed patient via mychart as well.

## 2020-02-11 NOTE — Telephone Encounter (Signed)
Spoke with patient and her husband today. Informed them updated prescription of Toprol XL 100 mg daily has been sent to [facility], and [facility] [employer] patient assistance application should be processed within 3-5 business days and then shipped to address of file. Patient verbalized understanding.

## 2020-02-13 NOTE — Telephone Encounter (Signed)
There is a note on the referral dated 11/30 that it was printed off and given to RN to review.  So, they are in the process of working on it.  Will route back to triage so MyChart message can be sent to pt to make her aware.  Phone # for Lillette Boxer is [phone] if pt wants to follow up in a few days.

## 2020-02-13 NOTE — Telephone Encounter (Signed)
Pt sent email asking about status of referral to pulmonary rehab. Please advise, thanks!

## 2020-02-15 DIAGNOSIS — J8489 Other specified interstitial pulmonary diseases: Secondary | ICD-10-CM | POA: Diagnosis not present

## 2020-02-17 NOTE — Telephone Encounter (Signed)
Received fax from [employer] that [facility] rx has been shipped to pt.

## 2020-02-18 ENCOUNTER — Other Ambulatory Visit: Payer: Medicare Other | Admitting: *Deleted

## 2020-02-18 ENCOUNTER — Ambulatory Visit (INDEPENDENT_AMBULATORY_CARE_PROVIDER_SITE_OTHER): Payer: Medicare Other | Admitting: Pharmacist

## 2020-02-18 ENCOUNTER — Other Ambulatory Visit: Payer: Self-pay

## 2020-02-18 VITALS — BP 142/86 | HR 67

## 2020-02-18 DIAGNOSIS — I5020 Unspecified systolic (congestive) heart failure: Secondary | ICD-10-CM | POA: Diagnosis not present

## 2020-02-18 DIAGNOSIS — I10 Essential (primary) hypertension: Secondary | ICD-10-CM | POA: Diagnosis not present

## 2020-02-18 NOTE — Patient Instructions (Addendum)
Increase your metoprolol from 179m to 1567mdaily  If your lab work from today is stable, we'll plan to increase your spironolactone from 2546mo 2m69mily - wait for my call tomorrow to discuss your lab results  Continue taking your other medications  Your blood pressure goal is < 130/80mm22m

## 2020-02-18 NOTE — Progress Notes (Signed)
Patient ID: ANNALYSSE SHOEMAKER                 DOB: 14-Jul-1945                      MRN: [id number]     HPI: Bannie Lobban Blanchardis a 74 y.o.femalereferredby Dr. Shanda Bumps HFclinic.PMH is significant for HFrEF (LVEF35-45%11/01/19), NICM with normal R and L heart pressures, HTN, HLD, and interstitial lung disease with pulmonary fibrosis. Her [name] has previously been discontinued due to dry mouth and eyes. At most recent visit, Toprol was increased to 126m daily, Maxzide was discontinued, and spironolactone was initiated for CHF benefit. She has also been [place] for [place].  Patient presents today in good spirits with her husband. Reports medication adherence and no side-effects with all HTN/CHF medications. Denies dizziness, headaches, blurred vision, and swelling. BP readings have ranged from 125/78 to 162/82. Unfortunately pt still hasn't received her [facility] though we received a fax yesterday stating they had shipped medication out to the pt. She called them today and they stated they hadn't shipped it yet but were going to. She has about 1 week left of her FWilder [place]adesupply at home.  Current CHF/HTN meds:   Metoprolol succinate1065mdaily(AM)  Farxiga10 mg daily (AM)   Spironolactone 2576maily(AM)  Amlodipine 5 mgdaily(AM)  Valsartan 320 mgdaily(AM)   Previously tried:  Entresto 49/51 mg (dry/itchy mouth, dry eyes) Lisinopril-hctz (SOB, coughing) Metoprolol tartrate (SOB, coughing) Clonidine  BP goal: <130/80 mmHg  Family History:Alcohol abuse in her father; Diabetes in her maternal grandmother; Drug abuse in her son; Heart disease in her mother; Hypertension in her mother; Osteoporosis in her mother; Stroke in her mother  Social History:2-3 mixed drinks weekly, former smoker (quit 10 years ago)  Diet:Does not add salt to food or cook with salt. Enjoys eating tomato-based foods. Does not eat breakfast. Eats  lunch/dinner at 3-4 pm daily. At an egg salad sandwich yesterday. Eats out at restaurants often ([place] avoids fast food.  Exercise:limited due to dyspnea -walksdog (halfway around block)  Home BP readings:  135/72, 138/103, 140/75, 125/78, 152/74, 162/82, 148/70, 144/76, HR 66 - 74  Wt Readings from Last 3 Encounters:  02/04/20 197 lb (89.4 kg)  02/04/20 196 lb 6.4 oz (89.1 kg)  01/21/20 196 lb (88.9 kg)   BP Readings from Last 3 Encounters:  02/04/20 122/72  02/04/20 120/72  01/21/20 140/76   Pulse Readings from Last 3 Encounters:  02/04/20 76  02/04/20 86  01/21/20 77    Renal function: Estimated Creatinine Clearance: 55.2 mL/min (A) (by C-G formula based on SCr of 1.05 mg/dL (H)).  Past Medical History:  Diagnosis Date  . Anemia   . Asthma    "I dont think I have it.  . CHF (congestive heart failure) ([facility] . Depression   . Emphysema of lung ([facility] . GERD (gastroesophageal reflux disease)   . Hemorrhoids   . History of kidney stones    Cystoscopy  . Hyperlipidemia   . Hypertension   . Hypothyroidism   . Obesity   . Pre-diabetes   . Rosacea   . Tubular adenoma of colon 03/26/2019   Colonoscopy repeat in 3 years    Current Outpatient Medications on File Prior to Visit  Medication Sig Dispense Refill  . acetaminophen (TYLENOL) 500 MG tablet Take 2 tablets (1,000 mg total) by mouth every 6 (six) hours as needed. 30[place]  tablet 0  . amLODipine (NORVASC) 5 MG tablet Take 1 tablet (5 mg total) by mouth daily. 90 tablet 3  . cephALEXin (KEFLEX) 250 MG capsule Take 250 mg by mouth at bedtime.     . cetirizine (ZYRTEC) 10 MG tablet Take 10 mg by mouth daily.    Marland Kitchen FARXIGA 10 MG TABS tablet Take 10 mg by mouth daily.    . fluticasone (FLONASE) 50 MCG/ACT nasal spray Place 2 sprays into both nostrils at bedtime as needed (allergies.).     Marland Kitchen levothyroxine (SYNTHROID) 125 MCG tablet Take 1 tablet (125 mcg total) by mouth daily. 90 tablet 0  . metoprolol  succinate (TOPROL XL) 100 MG 24 hr tablet Take 1 tablet (100 mg total) by mouth daily. 90 tablet 3  . montelukast (SINGULAIR) 10 MG tablet TAKE ONE TABLET BY MOUTH EVERY NIGHT AT BEDTIME 90 tablet 1  . Multiple Vitamin (MULTIVITAMIN WITH MINERALS) TABS tablet Take 1 tablet by mouth daily.    Marland Kitchen omeprazole (PRILOSEC) 20 MG capsule TAKE ONE CAPSULE BY MOUTH DAILY 90 capsule 3  . oxyCODONE (OXY IR/ROXICODONE) 5 MG immediate release tablet Take 1 tablet (5 mg total) by mouth every 4 (four) hours as needed for severe pain. 30 tablet 0  . Pirfenidone (ESBRIET) 267 MG TABS Take 3 tablets (801 mg total) by mouth in the morning, at noon, and at bedtime. 270 tablet 5  . rosuvastatin (CRESTOR) 10 MG tablet Take 1 tablet (10 mg total) by mouth daily. 90 tablet 3  . spironolactone (ALDACTONE) 25 MG tablet Take 1 tablet (25 mg total) by mouth daily. 90 tablet 3  . valsartan (DIOVAN) 320 MG tablet Take 1 tablet (320 mg total) by mouth daily. 90 tablet 3  . venlafaxine XR (EFFEXOR-XR) 150 MG 24 hr capsule Take 1 capsule (150 mg total) by mouth daily. 90 capsule 3   No current facility-administered medications on file prior to visit.    Allergies  Allergen Reactions  . Ace Inhibitors Cough  . Clarithromycin     REACTION: GI sx  . Codeine Nausea Only  . Telmisartan Other (See Comments)    sleepy     Assessment/Plan:     Assessment/Plan:  1. CHF - BP above goal <130/30mHg since stopping Maxzide and starting spironolactone for CHF benefit. Will increase Toprol to 1558mdaily. Pending BMET today, will also increase spironolactone from 255mo 71m48mily. Continue Farxiga 10mg20mly (pt should be receiving shipment soon), valsartan 320mg 81my, and amlodipine 5mg da18m. Hopefully will be able to wean pt off of amlodipine and just continue her on medications with dual CHF-HTN benefit. Will schedule f/u appt in 1-2 weeks when she is contact tomorrow regarding spironolactone dosing. Pt will need new Toprol  rx at next visit pending further dose changes. She will keep follow up with Dr AcharyaMargaretann Lovelessonth.  Million Maharaj E. Cody Albus, PharmD, BCACP, CPP [place].[id number] [id number] [phone] 2:08 PM

## 2020-02-19 ENCOUNTER — Telehealth: Payer: Self-pay | Admitting: Pharmacist

## 2020-02-19 LAB — BASIC METABOLIC PANEL
BUN/Creatinine Ratio: 13 (ref 12–28)
BUN: 13 mg/dL (ref 8–27)
CO2: 22 mmol/L (ref 20–29)
Calcium: 9.5 mg/dL (ref 8.7–10.3)
Chloride: 102 mmol/L (ref 96–106)
Creatinine, Ser: 1.03 mg/dL — ABNORMAL HIGH (ref 0.57–1.00)
GFR calc Af Amer: 61 mL/min/{1.73_m2} (ref >59)
GFR calc non Af Amer: 53 mL/min/{1.73_m2} — ABNORMAL LOW (ref >59)
Glucose: 108 mg/dL — ABNORMAL HIGH (ref 65–99)
Potassium: 4.1 mmol/L (ref 3.5–5.2)
Sodium: 142 mmol/L (ref 134–144)

## 2020-02-19 MED ORDER — SPIRONOLACTONE 50 MG PO TABS: 50.0000 mg | ORAL_TABLET | Freq: Every day | ORAL | 3 refills | Status: DC

## 2020-02-19 NOTE — Telephone Encounter (Signed)
Scr and K stable- will increase spironolactone from 36m to 520mdaily. She will need appointment scheduled in 1-2 weeks with HTN clinic.  I have called and LVM for patient to call back.

## 2020-02-19 NOTE — Telephone Encounter (Signed)
Called and spoke with patient. Advised her of the above. Rx sent to [facility]. Apt made for 12/16 @ 1:30.

## 2020-02-20 ENCOUNTER — Telehealth (HOSPITAL_COMMUNITY): Payer: Self-pay | Admitting: Family Medicine

## 2020-02-24 ENCOUNTER — Encounter: Payer: Self-pay | Admitting: Family Medicine

## 2020-02-24 ENCOUNTER — Ambulatory Visit (HOSPITAL_COMMUNITY): Payer: Medicare Other | Attending: Cardiology

## 2020-02-24 ENCOUNTER — Other Ambulatory Visit: Payer: Self-pay

## 2020-02-24 DIAGNOSIS — I5020 Unspecified systolic (congestive) heart failure: Secondary | ICD-10-CM | POA: Diagnosis not present

## 2020-02-24 LAB — ECHOCARDIOGRAM COMPLETE
Area-P 1/2: 2.59 cm2
S' Lateral: 4.1 cm

## 2020-02-27 ENCOUNTER — Other Ambulatory Visit: Payer: Self-pay

## 2020-02-27 ENCOUNTER — Ambulatory Visit (INDEPENDENT_AMBULATORY_CARE_PROVIDER_SITE_OTHER): Payer: Medicare Other | Admitting: Pharmacist

## 2020-02-27 VITALS — BP 138/82 | HR 68 | Wt 198.0 lb

## 2020-02-27 DIAGNOSIS — I10 Essential (primary) hypertension: Secondary | ICD-10-CM

## 2020-02-27 DIAGNOSIS — I5020 Unspecified systolic (congestive) heart failure: Secondary | ICD-10-CM | POA: Diagnosis not present

## 2020-02-27 NOTE — Patient Instructions (Addendum)
It was nice to see you today!  Your blood pressure goal is < 130/16mHg   Increase your metoprolol from 1563mto 20025maily. You can take 2 of the 100m62mblets. When you run out (in about 45 days), we will change your metoprolol to carvedilol. This is dosed twice daily but helps to lower your blood pressure a bit more  Continue taking your other medications  Keep your follow up with Dr AchaMargaretann Loveless3 weeks

## 2020-02-27 NOTE — Progress Notes (Signed)
Patient ID: Susan Hunt                 DOB: 1945/10/05                      MRN: [id number]     HPI: Kara Melching Blanchardis a 74 y.o.femalereferredby Dr. Shanda Bumps HFclinic.PMH is significant for HFrEF (LVEF35-45%11/01/19), NICM with normal R and L heart pressures, HTN, HLD, and interstitial lung disease with pulmonary fibrosis. Her [name] has previously been discontinued due to dry mouth and eyes. Over the past few visits, Toprol dose has been titrated, Maxzide was discontinued, and spironolactone was initiated for CHF benefit. She has also been approved for [place] patient assistance.  Patient presents today in good spirits with her husband. Reports medication adherence and no side-effects with all HTN/CHF medications. Denies dizziness, headaches, blurred vision, and swelling. BP readings have ranged from 135/72 - 154/82, HR 67-74. Unfortunately [place] delivery has been slow. She just received her first shipment today and had been out of med for the past 2 days. Just picked up a refill of metoprolol 132m.  Has noticed that BP in her left arm is higher than her right arm at home.  Left arm: 142/64, 148/77, 154/82 Right arm: 138/64, 135/72  BP checked in both arms in clinic today and readings were similar: 140/78 in her left arm and 138/82 in her right arm. She has previously brought her BP cuff to clinic and it was measuring correctly.  Current CHF/HTN meds:   Metoprolol succinate1563mdaily(AM)  Farxiga10 mg daily (AM)   Spironolactone 5076maily(AM)  Amlodipine 5 mgdaily(AM)  Valsartan 320 mgdaily(AM)   Previously tried:  Entresto 49/51 mg (dry/itchy mouth, dry eyes) Lisinopril-hctz (SOB, coughing) Metoprolol tartrate (SOB, coughing) Clonidine  BP goal: <130/80 mmHg  Family History:Alcohol abuse in her father; Diabetes in her maternal grandmother; Drug abuse in her son; Heart disease in her mother; Hypertension in her mother; Osteoporosis in her  mother; Stroke in her mother  Social History:2-3 mixed drinks weekly, former smoker (quit 10 years ago)  Diet:Does not add salt to food or cook with salt. Enjoys eating tomato-based foods. Does not eat breakfast. Eats lunch/dinner at 3-4 pm daily. At an egg salad sandwich yesterday. Eats out at restaurants often ([place] avoids fast food.  Exercise:limited due to dyspnea -walksdog (halfway around block)  Home BP readings:  135/72, 138/103, 140/75, 125/78, 152/74, 162/82, 148/70, 144/76, HR 66 - 74  Wt Readings from Last 3 Encounters:  02/04/20 197 lb (89.4 kg)  02/04/20 196 lb 6.4 oz (89.1 kg)  01/21/20 196 lb (88.9 kg)   BP Readings from Last 3 Encounters:  02/18/20 (!) 142/86  02/04/20 122/72  02/04/20 120/72   Pulse Readings from Last 3 Encounters:  02/18/20 67  02/04/20 76  02/04/20 86    Renal function: CrCl cannot be calculated (Unknown ideal weight.).  Past Medical History:  Diagnosis Date  . Anemia   . Asthma    "I dont think I have it.  . CHF (congestive heart failure) ([place] . Depression   . Emphysema of lung ([facility] . GERD (gastroesophageal reflux disease)   . Hemorrhoids   . History of kidney stones    Cystoscopy  . Hyperlipidemia   . Hypertension   . Hypothyroidism   . Obesity   . Pre-diabetes   . Rosacea   . Tubular adenoma of colon 03/26/2019   Colonoscopy repeat in 3 years  Current Outpatient Medications on File Prior to Visit  Medication Sig Dispense Refill  . acetaminophen (TYLENOL) 500 MG tablet Take 2 tablets (1,000 mg total) by mouth every 6 (six) hours as needed. 30 tablet 0  . amLODipine (NORVASC) 5 MG tablet Take 1 tablet (5 mg total) by mouth daily. 90 tablet 3  . cephALEXin (KEFLEX) 250 MG capsule Take 250 mg by mouth at bedtime.     . cetirizine (ZYRTEC) 10 MG tablet Take 10 mg by mouth daily.    Marland Kitchen FARXIGA 10 MG TABS tablet Take 10 mg by mouth daily.    . fluticasone (FLONASE) 50 MCG/ACT nasal spray Place  2 sprays into both nostrils at bedtime as needed (allergies.).     Marland Kitchen levothyroxine (SYNTHROID) 125 MCG tablet Take 1 tablet (125 mcg total) by mouth daily. 90 tablet 0  . metoprolol succinate (TOPROL XL) 100 MG 24 hr tablet Take 1.5 tablets (150 mg total) by mouth daily. 90 tablet 3  . montelukast (SINGULAIR) 10 MG tablet TAKE ONE TABLET BY MOUTH EVERY NIGHT AT BEDTIME 90 tablet 1  . Multiple Vitamin (MULTIVITAMIN WITH MINERALS) TABS tablet Take 1 tablet by mouth daily.    Marland Kitchen omeprazole (PRILOSEC) 20 MG capsule TAKE ONE CAPSULE BY MOUTH DAILY 90 capsule 3  . oxyCODONE (OXY IR/ROXICODONE) 5 MG immediate release tablet Take 1 tablet (5 mg total) by mouth every 4 (four) hours as needed for severe pain. 30 tablet 0  . Pirfenidone (ESBRIET) 267 MG TABS Take 3 tablets (801 mg total) by mouth in the morning, at noon, and at bedtime. 270 tablet 5  . rosuvastatin (CRESTOR) 10 MG tablet Take 1 tablet (10 mg total) by mouth daily. 90 tablet 3  . spironolactone (ALDACTONE) 50 MG tablet Take 1 tablet (50 mg total) by mouth daily. 90 tablet 3  . valsartan (DIOVAN) 320 MG tablet Take 1 tablet (320 mg total) by mouth daily. 90 tablet 3  . venlafaxine XR (EFFEXOR-XR) 150 MG 24 hr capsule Take 1 capsule (150 mg total) by mouth daily. 90 capsule 3   No current facility-administered medications on file prior to visit.    Allergies  Allergen Reactions  . Ace Inhibitors Cough  . Clarithromycin     REACTION: GI sx  . Codeine Nausea Only  . Telmisartan Other (See Comments)    sleepy     Assessment/Plan:  1. CHF - BP has improved but remains above goal <130/52mHg. Discussed changing Toprol to carvedilol for better BP lowering, however pt just picked up 90 day supply of Toprol and wants to use this first. Will increase Toprol from 1545mto 20033maily. When pt runs out, will change to carvedilol 53m75mD. At that time, CHF meds will be optimized. Checking BMET today due to recent spironolactone dose increase.  Continue Farxiga 10mg70mly, spironolactone 50mg 31my and valsartan 320mg d44m for CHF/BP benefit, as well as amlodipine 5mg dai87mfor BP control. Pt will keep follow up in 3 weeks with Dr Acharya.Margaretann Lovelessan E. Martika Egler, PharmD, BCACP, CPP [facility] [phone] 8:43 AM

## 2020-02-28 LAB — BASIC METABOLIC PANEL
BUN/Creatinine Ratio: 19 (ref 12–28)
BUN: 15 mg/dL (ref 8–27)
CO2: 18 mmol/L — ABNORMAL LOW (ref 20–29)
Calcium: 9.5 mg/dL (ref 8.7–10.3)
Chloride: 107 mmol/L — ABNORMAL HIGH (ref 96–106)
Creatinine, Ser: 0.81 mg/dL (ref 0.57–1.00)
GFR calc Af Amer: 82 mL/min/{1.73_m2} (ref >59)
GFR calc non Af Amer: 71 mL/min/{1.73_m2} (ref >59)
Glucose: 108 mg/dL — ABNORMAL HIGH (ref 65–99)
Potassium: 4.3 mmol/L (ref 3.5–5.2)
Sodium: 141 mmol/L (ref 134–144)

## 2020-03-06 ENCOUNTER — Other Ambulatory Visit: Payer: Self-pay | Admitting: Family Medicine

## 2020-03-17 ENCOUNTER — Encounter: Payer: Self-pay | Admitting: Internal Medicine

## 2020-03-17 ENCOUNTER — Other Ambulatory Visit: Payer: Self-pay

## 2020-03-17 ENCOUNTER — Ambulatory Visit: Payer: Medicare Other | Admitting: Internal Medicine

## 2020-03-17 VITALS — BP 132/78 | HR 63 | Ht 67.0 in | Wt 198.0 lb

## 2020-03-17 DIAGNOSIS — I5020 Unspecified systolic (congestive) heart failure: Secondary | ICD-10-CM | POA: Diagnosis not present

## 2020-03-17 DIAGNOSIS — I428 Other cardiomyopathies: Secondary | ICD-10-CM

## 2020-03-17 DIAGNOSIS — Z79899 Other long term (current) drug therapy: Secondary | ICD-10-CM

## 2020-03-17 DIAGNOSIS — I10 Essential (primary) hypertension: Secondary | ICD-10-CM

## 2020-03-17 DIAGNOSIS — J8489 Other specified interstitial pulmonary diseases: Secondary | ICD-10-CM | POA: Diagnosis not present

## 2020-03-17 MED ORDER — SPIRONOLACTONE 50 MG PO TABS
75.0000 mg | ORAL_TABLET | Freq: Every day | ORAL | 5 refills | Status: DC
Start: 2020-03-17 — End: 2020-12-11

## 2020-03-17 NOTE — Progress Notes (Signed)
Cardiology Office Note:    Date:  03/17/2020   ID:  Susan Hunt, DOB Jul 20, 1945, MRN [id number]  PCP:  Leamon Arnt, MD  Cardiologist:  No primary care provider on file.  Electrophysiologist:  None   Referring MD: Leamon Arnt, MD   Chief Complaint/Reason for Referral: NICM  History of Present Illness:    Susan Hunt is a 75 y.o. female with a history of nonischemic cardiomyopathy with normal right and left heart pressures, hypertension, hyperlipidemia, interstitial lung disease with pulmonary fibrosis. She has been followed closely for HF med titration by [facility], I am grateful for their assistance. Last visit 02/27/20.  Heart Failure Therapy currently ACE-I/ARB/ARNI: Valsartan 320 mg daily BB: Metoprolol succinate 200 mg daily increased at last visit MRA:  spironolactone 50 mg daily SGLT2I: farxiga 10 mg daily  Also on amlodipine 5 mg daily for HTN.   Previously tried:  Entresto 49/51 mg (dry/itchy mouth, dry eyes) Lisinopril-hctz (SOB, coughing) Metoprolol tartrate (SOB, coughing) Clonidine  Continues to have shortness of breath that she does not feel has changed significantly.   Repeat echo demonstrates EF remains low. We discussed performing a cardiac MRI to further evaluate NICM.   Past Medical History:  Diagnosis Date  . Anemia   . Asthma    "I dont think I have it.  . CHF (congestive heart failure) ([place])   . Depression   . Emphysema of lung ([place])   . GERD (gastroesophageal reflux disease)   . Hemorrhoids   . History of kidney stones    Cystoscopy  . Hyperlipidemia   . Hypertension   . Hypothyroidism   . Obesity   . Pre-diabetes   . Rosacea   . Tubular adenoma of colon 03/26/2019   Colonoscopy repeat in 3 years    Past Surgical History:  Procedure Laterality Date  . ABDOMINAL HYSTERECTOMY    . ANTERIOR AND POSTERIOR VAGINAL REPAIR  2020  . CARDIAC CATHETERIZATION  11/01/2019  . CATARACT EXTRACTION W/ INTRAOCULAR LENS IMPLANT  Bilateral 2016  . COLONOSCOPY    . CYSTOSCOPY    . DENTAL SURGERY  2016   Dental Implant  . EYE SURGERY Bilateral    cataract  . FRACTURE SURGERY Left 2013   Left ankle  . INTERCOSTAL NERVE BLOCK Right 12/13/2019   Procedure: INTERCOSTAL NERVE BLOCK;  Surgeon: Melrose Nakayama, MD;  Location: [facility];  Service: Thoracic;  Laterality: Right;  . LUNG BIOPSY Right 12/13/2019   Procedure: LUNG BIOPSY;  Surgeon: Melrose Nakayama, MD;  Location: [facility];  Service: Thoracic;  Laterality: Right;  . LYMPH NODE BIOPSY Right 12/13/2019   Procedure: LYMPH NODE BIOPSY;  Surgeon: Melrose Nakayama, MD;  Location: [facility];  Service: Thoracic;  Laterality: Right;  . medtronix nerve stimulator  07/18/2019   Complete InterStim Sacral Neuromudulation system implantation 07/18/19, [facility]  . NASAL SINUS SURGERY    . RIGHT/LEFT HEART CATH AND CORONARY ANGIOGRAPHY N/A 11/01/2019   Procedure: RIGHT/LEFT HEART CATH AND CORONARY ANGIOGRAPHY;  Surgeon: Martinique, Peter M, MD;  Location: [facility];  Service: Cardiovascular;  Laterality: N/A;  . VAGINECTOMY, PARTIAL    . VIDEO BRONCHOSCOPY N/A 12/13/2019   Procedure: VIDEO BRONCHOSCOPY;  Surgeon: Melrose Nakayama, MD;  Location: [facility];  Service: Thoracic;  Laterality: N/A;    Current Medications: Current Meds  Medication Sig  . acetaminophen (TYLENOL) 500 MG tablet Take 2 tablets (1,000 mg total) by mouth every 6 (six) hours as needed.  Marland Kitchen  amLODipine (NORVASC) 5 MG tablet Take 1 tablet (5 mg total) by mouth daily.  . cephALEXin (KEFLEX) 250 MG capsule Take 250 mg by mouth at bedtime.   . cetirizine (ZYRTEC) 10 MG tablet Take 10 mg by mouth daily.  Marland Kitchen FARXIGA 10 MG TABS tablet Take 10 mg by mouth daily.  . fluticasone (FLONASE) 50 MCG/ACT nasal spray Place 2 sprays into both nostrils at bedtime as needed (allergies.).   Marland Kitchen levothyroxine (SYNTHROID) 125 MCG tablet TAKE ONE TABLET BY MOUTH DAILY  . metoprolol succinate (TOPROL-XL) 100 MG 24 hr tablet Take  2 tablets (200 mg total) by mouth daily.  . montelukast (SINGULAIR) 10 MG tablet TAKE ONE TABLET BY MOUTH EVERY NIGHT AT BEDTIME  . Multiple Vitamin (MULTIVITAMIN WITH MINERALS) TABS tablet Take 1 tablet by mouth daily.  Marland Kitchen omeprazole (PRILOSEC) 20 MG capsule TAKE ONE CAPSULE BY MOUTH DAILY  . oxyCODONE (OXY IR/ROXICODONE) 5 MG immediate release tablet Take 1 tablet (5 mg total) by mouth every 4 (four) hours as needed for severe pain.  . Pirfenidone (ESBRIET) 267 MG TABS Take 3 tablets (801 mg total) by mouth in the morning, at noon, and at bedtime.  . rosuvastatin (CRESTOR) 10 MG tablet Take 1 tablet (10 mg total) by mouth daily.  Marland Kitchen spironolactone (ALDACTONE) 50 MG tablet Take 1 tablet (50 mg total) by mouth daily.  . valsartan (DIOVAN) 320 MG tablet Take 1 tablet (320 mg total) by mouth daily.  Marland Kitchen venlafaxine XR (EFFEXOR-XR) 150 MG 24 hr capsule Take 1 capsule (150 mg total) by mouth daily.     Allergies:   Ace inhibitors, Clarithromycin, Codeine, and Telmisartan   Social History   Tobacco Use  . Smoking status: Former Smoker    Packs/day: 0.50    Years: 15.00    Pack years: 7.50    Types: Cigarettes    Quit date: 08/18/2013    Years since quitting: 6.5  . Smokeless tobacco: Never Used  Vaping Use  . Vaping Use: Never used  Substance Use Topics  . Alcohol use: Yes    Alcohol/week: 1.0 standard drink    Types: 1 Glasses of wine per week    Comment: 2-3 glasses of wine weekly  . Drug use: No     Family History: The patient's family history includes Alcohol abuse in her father; Diabetes in her maternal grandmother; Drug abuse in her son; Heart disease in her mother; Hypertension in her mother; Osteoporosis in her mother; Stroke in her mother. There is no history of Breast cancer.  ROS:   Please see the history of present illness.    All other systems reviewed and are negative.  EKGs/Labs/Other Studies Reviewed:    The following studies were reviewed today: Recent  Labs: 12/09/2019: TSH 0.31 12/16/2019: Hemoglobin 11.0; Platelets 265 02/04/2020: ALT 22 02/27/2020: BUN 15; Creatinine, Ser 0.81; Potassium 4.3; Sodium 141  Recent Lipid Panel    Component Value Date/Time   CHOL 155 12/09/2019 1645   TRIG 87 12/09/2019 1645   HDL 65 12/09/2019 1645   CHOLHDL 2.4 12/09/2019 1645   VLDL 28.4 07/19/2019 1333   LDLCALC 73 12/09/2019 1645   LDLDIRECT 146.7 05/29/2006 1115    Physical Exam:    VS:  BP 132/78   Pulse 63   Ht _0  (1.702 m)   Wt 198 lb (89.8 kg)   SpO2 95%   BMI 31.01 kg/m     Wt Readings from Last 5 Encounters:  03/17/20 198 lb (89.8 kg)  02/27/20 198 lb (89.8 kg)  02/04/20 197 lb (89.4 kg)  02/04/20 196 lb 6.4 oz (89.1 kg)  01/21/20 196 lb (88.9 kg)    Constitutional: No acute distress Eyes: sclera non-icteric, normal conjunctiva and lids ENMT: normal dentition, moist mucous membranes Cardiovascular: regular rhythm, normal rate, no murmurs. S1 and S2 normal. Radial pulses normal bilaterally. No jugular venous distention.  Respiratory: fine bibasilar rhonchi GI : normal bowel sounds, soft and nontender. No distention.   MSK: extremities warm, well perfused. No edema.  NEURO: grossly nonfocal exam, moves all extremities. PSYCH: alert and oriented x 3, normal mood and affect.   ASSESSMENT:    1. Nonischemic cardiomyopathy ([place])   2. Medication management   3. HFrEF (heart failure with reduced ejection fraction) ([place])   4. Essential hypertension    PLAN:    Nonischemic cardiomyopathy ([place])  - Plan: MR CARDIAC MORPHOLOGY W WO CONTRAST, Basic metabolic panel -We discussed further evaluation of NICM with cardiac MRI. She and her husband are willing to proceed.   Essential hypertension Medication management - Plan: Basic metabolic panel HFrEF (heart failure with reduced ejection fraction) ([place]) - current meds reviewed. Will uptitrate spironolactone further to 75 mg daily. Discussed previously transitioning to carvedilol  for HF as well as HTN, however she recently picked up a large refill of Toprol XL. OK to wait to transition to [place] since we will uptitrate other therapy in the mean time.   Heart Failure Therapy ACE-I/ARB/ARNI: valsartan 320 mg daily BB: toprol xl 200 mg daily MRA: spironolactone 75 mg daily SGLT2I: farxiga 10 mg daily Diuretic plan: euvolemic on exam, none required.   Total time of encounter: 30 minutes total time of encounter, including 25 minutes spent in face-to-face patient care on the date of this encounter. This time includes coordination of care and counseling regarding above mentioned problem list. Remainder of non-face-to-face time involved reviewing chart documents/testing relevant to the patient encounter and documentation in the medical record. I have independently reviewed documentation from referring provider.   Cherlynn Kaiser, MD [place]  [facility]    Medication Adjustments/Labs and Tests Ordered: Current medicines are reviewed at length with the patient today.  Concerns regarding medicines are outlined above.   Orders Placed This Encounter  Procedures  . MR CARDIAC MORPHOLOGY W WO CONTRAST  . Basic metabolic panel      Meds ordered this encounter  Medications  . spironolactone (ALDACTONE) 50 MG tablet    Sig: Take 1.5 tablets (75 mg total) by mouth daily.    Dispense:  45 tablet    Refill:  5    Patient Instructions  Medication Instructions:  INCREASE SPIRONOLACTONE TO 63m DAILY (1.5 Tablets) PLEASE CALL [place]EN YOUR ALMOST FINISHED WITH TOPROL XL- SO WE CAN SWTICH MEDICATIONS  *If you need a refill on your cardiac medications before your next appointment, please call your pharmacy*  Lab Work: BMET- 1 week prior to Cardiac MRI If you have labs (blood work) drawn today and your tests are completely normal, you will receive your results only by: .Marland KitchenMyChart Message (if you have MyChart) OR . A paper copy in the mail If you have any lab test  that is abnormal or we need to change your treatment, we will call you to review the results.  Testing/Procedures: Your physician has requested that you have a cardiac MRI. Cardiac MRI uses a computer to create images of your heart as its beating, producing both still and moving pictures of your  heart and major blood vessels. For further information please visit [email or web address] Please follow the instruction sheet given to you today for more information. SOMEONE WILL REACH OUT TO YOU TO SCHEDULE THIS   Follow-Up: At [facility], you and your health needs are our priority.  As part of our continuing mission to provide you with exceptional heart care, we have created designated Provider Care Teams.  These Care Teams include your primary Cardiologist (physician) and Advanced Practice Providers (APPs -  Physician Assistants and Nurse Practitioners) who all work together to provide you with the care you need, when you need it.  Your next appointment:   1 month(s)  The format for your next appointment:   In Person  Provider:   Cherlynn Kaiser, MD

## 2020-03-17 NOTE — Patient Instructions (Addendum)
Medication Instructions:  INCREASE SPIRONOLACTONE TO 11m DAILY (1.5 Tablets) PLEASE CALL [place]EN YOUR ALMOST FINISHED WITH TOPROL XL- SO WE CAN SWTICH MEDICATIONS  *If you need a refill on your cardiac medications before your next appointment, please call your pharmacy*  Lab Work: BMET- 1 week prior to Cardiac MRI If you have labs (blood work) drawn today and your tests are completely normal, you will receive your results only by: .Marland KitchenMyChart Message (if you have MyChart) OR . A paper copy in the mail If you have any lab test that is abnormal or we need to change your treatment, we will call you to review the results.  Testing/Procedures: Your physician has requested that you have a cardiac MRI. Cardiac MRI uses a computer to create images of your heart as its beating, producing both still and moving pictures of your heart and major blood vessels. For further information please visit [email or web address] Please follow the instruction sheet given to you today for more information. SOMEONE WILL REACH OUT TO YOU TO SCHEDULE THIS   Follow-Up: [facility] and [facility] needs [facility] our [facility].  As part of our continuing mission to provide you with exceptional heart care, we have created designated Provider Care Teams.  These Care Teams include your primary Cardiologist (physician) and Advanced Practice Providers (APPs -  Physician Assistants and Nurse Practitioners) who all work together to provide you with the care you need, when you need it.  Your next appointment:   1 month(s)  The format for your next appointment:   In Person  Provider:   GCherlynn Kaiser MD

## 2020-03-19 NOTE — Telephone Encounter (Signed)
Please call sue beck to get scheduled with me as a new patient. See mychart information.

## 2020-03-19 NOTE — Telephone Encounter (Signed)
Called Susan Hunt and LVM to schedule appt

## 2020-03-20 ENCOUNTER — Other Ambulatory Visit: Payer: Self-pay | Admitting: Family Medicine

## 2020-03-23 ENCOUNTER — Telehealth: Payer: Self-pay | Admitting: Internal Medicine

## 2020-03-23 ENCOUNTER — Telehealth (HOSPITAL_COMMUNITY): Payer: Self-pay

## 2020-03-23 ENCOUNTER — Encounter: Payer: Self-pay | Admitting: Internal Medicine

## 2020-03-23 NOTE — Telephone Encounter (Signed)
Spoke with patient regarding scheduled appointment for the Cardiac MRI ordered by Dr. Valarie Cones Wednesday 04/08/20 at 12:00 pm---arrival time is 11:30 am---1st floor admissions office at [facility].  Will mail information to patient and it is also available in My Chart.  Patient voiced her understanding.

## 2020-03-23 NOTE — Telephone Encounter (Signed)
Pt insurance is active and benefits verified through [facility] Medicare Co-pay 0, DED 0/0 met, out of pocket $3,600/0 met, co-insurance 0%. no pre-authorization required. Passport, [place]/[employer] 03/23/2020_0 :13am, REF# [id number]

## 2020-03-25 ENCOUNTER — Telehealth: Payer: Self-pay | Admitting: Internal Medicine

## 2020-03-25 NOTE — Telephone Encounter (Signed)
Spoke to [place] with [employer], who stated that patient dropped off oxygen equipment.  Patient signed refusal with [employer], due to [place] not having an order from our office to d/c supplemental oxygen. It appears that patient was prescribed 2L cont.  I have spoken to patient, who stated that she has not used oxygen since being home from the hospital. She also stated that she plans to purchase an inogen machince to have as needed.  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

## 2020-03-26 DIAGNOSIS — I428 Other cardiomyopathies: Secondary | ICD-10-CM | POA: Diagnosis not present

## 2020-03-26 DIAGNOSIS — Z79899 Other long term (current) drug therapy: Secondary | ICD-10-CM | POA: Diagnosis not present

## 2020-03-27 LAB — BASIC METABOLIC PANEL
BUN/Creatinine Ratio: 14 (ref 12–28)
BUN: 15 mg/dL (ref 8–27)
CO2: 24 mmol/L (ref 20–29)
Calcium: 9.6 mg/dL (ref 8.7–10.3)
Chloride: 102 mmol/L (ref 96–106)
Creatinine, Ser: 1.11 mg/dL — ABNORMAL HIGH (ref 0.57–1.00)
GFR calc Af Amer: 56 mL/min/{1.73_m2} — ABNORMAL LOW (ref >59)
GFR calc non Af Amer: 49 mL/min/{1.73_m2} — ABNORMAL LOW (ref >59)
Glucose: 190 mg/dL — ABNORMAL HIGH (ref 65–99)
Potassium: 5 mmol/L (ref 3.5–5.2)
Sodium: 139 mmol/L (ref 134–144)

## 2020-04-02 ENCOUNTER — Other Ambulatory Visit: Payer: Self-pay

## 2020-04-02 ENCOUNTER — Other Ambulatory Visit: Payer: Self-pay | Admitting: Pharmacist

## 2020-04-02 ENCOUNTER — Ambulatory Visit: Payer: Medicare Other | Admitting: Internal Medicine

## 2020-04-02 ENCOUNTER — Encounter: Payer: Self-pay | Admitting: Internal Medicine

## 2020-04-02 VITALS — BP 126/68 | HR 58 | Temp 98.0°F | Ht 67.0 in | Wt 197.4 lb

## 2020-04-02 DIAGNOSIS — J849 Interstitial pulmonary disease, unspecified: Secondary | ICD-10-CM

## 2020-04-02 DIAGNOSIS — J84112 Idiopathic pulmonary fibrosis: Secondary | ICD-10-CM | POA: Diagnosis not present

## 2020-04-02 DIAGNOSIS — Z5181 Encounter for therapeutic drug level monitoring: Secondary | ICD-10-CM

## 2020-04-02 LAB — HEPATIC FUNCTION PANEL
ALT: 17 U/L (ref 0–35)
AST: 22 U/L (ref 0–37)
Albumin: 4.3 g/dL (ref 3.5–5.2)
Alkaline Phosphatase: 91 U/L (ref 39–117)
Bilirubin, Direct: 0.1 mg/dL (ref 0.0–0.3)
Total Bilirubin: 0.4 mg/dL (ref 0.2–1.2)
Total Protein: 7.8 g/dL (ref 6.0–8.3)

## 2020-04-02 MED ORDER — ESBRIET 801 MG PO TABS: 801.0000 mg | ORAL_TABLET | Freq: Three times a day (TID) | ORAL | 3 refills | Status: DC

## 2020-04-02 NOTE — Progress Notes (Signed)
OV 08/19/2019  Subjective:  Patient ID: Susan Hunt, female , DOB: 1945-11-10 , age 75 y.o. , MRN: [id number] , ADDRESS: [place] [address]   08/19/2019 -   Chief Complaint  Patient presents with  . Consult    COPD, emphysema.  sob with exertion.  dry coughs alot at night     HPI Susan Hunt 75 y.o. -she is a retired Marine scientist.  She used to work at the [employer] and having retired from that 10 years ago and then she worked for another agency apparently having retired from all duties few years ago.  Husband is a retired Software engineer.  They are here for shortness of breath.  She tells me that she has had insidious onset of shortness of breath and cough that started 1 year ago and has been progressive.  In the same time she is also been on nitrofurantoin for times.  First 3 of these were 1 week each.  The last 1 being for at least 6 weeks ending approximately 4-6 weeks ago.  Because of progressive symptoms she was given Stiolto but this did not help.  She has been on longstanding Singulair for several years independent of these problems.  This because of allergies.  Therefore she has been referred here.  She did have a chest x-ray that on my personal visualization shows ILD changes.  It is documented below.  She did have a CT abdomen lung images that was reported as normal but I am not so sure from a year ago.  She is a previous smoker.  She is also self-referred herself to [facility] on the basis that she think she might have advanced COPD and is looking for a Zephyr valve.  Symptoms started in August 2020.   [place] Integrated Comprehensive ILD Questionnaire  Symptoms:   SYMPTOM SCALE - ILD 08/19/2019   O2 use ra  Shortness of Breath 0 -> 5 scale with 5 being worst (score 6 If unable to do)  At rest 1  Simple tasks - showers, clothes change, eating, shaving 2  Household (dishes, doing bed, laundry) 5  Shopping 5  Walking  level at own pace 2  Walking up Stairs 5  Total (30-36) Dyspnea Score 20  How bad is your cough? x  How bad is your fatigue x  How bad is nausea x  How bad is vomiting?  x  How bad is diarrhea? x  How bad is anxiety? x  How bad is depression x    She does have a cough.  She says the cough started in August 2020 the same for shortness of breath started.  She does cough at night.  She does cough when she lies down and it gets worse.  She does feel a tickle in her throat.  Cough does not affect her voice she does not bring any phlegm.  There is no wheezing.  There is no nausea no vomiting no diarrhea.    Past Medical History :   She does have a histo abdomen ry of asthma for the last few years.  Also COPD not otherwise specified for the last few years.  His acid reflux disease for the last few years.  Diabetes for the last few weeks and thyroid disease for the last few years.  Otherwise denies any collagen vascular disease or vasculitis.  Denies any sleep apnea.  Denies pulmonary hypertension.  Denies stroke denies  pneumonias recurrent pneumonias.  Denies heart disease or pleurisy.   ROS: Positive for fatigue for the last several months.  Arthralgia for the last several months.  Dry eyes for the last several months.  With intermittent nausea for the last few years.  T there is daytime sleepiness for the last several months.     FAMILY HISTORY of LUNG DISEASE:  -Denies any pulmonary fibrosis.  Denies COPD denies asthma in the family.  Denies sarcoid denies cystic fibrosis denies hypersensitive pneumonitis.  Denies any autoimmune disease.   EXPOSURE HISTORY:   -She denies any Covid history.  Denies any exposure to Covid.  She did do Covid vaccine.  No reactions after the Covid vaccine.  Did smoke cigarettes 15-year 2000 and 2015 10 to 12 cigarettes a day.  Did not do any passive smoking.  Never did any marijuana.  Elevated electronic cigarettes.  Never use cocaine never used intravenous drug  use.   HOME and HOBBY DETAILS :  -[place]  Is lived there for 21 years.  Age of the home is 34 years.  In 2004 there was mold/mildew in the shower curtain.  And then there was mold under the shower.  This was torn down.  This was in 2004 but currently none.  She did some mulch back at work 2 months ago.  No humidifier use no CPAP use.  No nebulizer use.  No steam iron use.  No Jacuzzi use.  No misting Fountain in the house.  No pet birds or parakeets no gerbils.  No feather pillows.  No mold in the [facility] duct.  No music habits.   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  No otehrr organic antigen exposure that she recollects (MR side note: a different patient in 2016 reported that in 2013-2016 time lot of renovations there and mold +).  Patient reported on September 10, 2019: That between 1966 and 1980 she worked at [employer] and [place].  At that time there is lot of mold exposure in the building.  The building was cleaned out.  Also checked for inorganic antigen exposure and this is negative.  Never worked in a dusty environment.  Never exposed to fumes.   PULMONARY TOXICITY HISTORY (27 items): Use nitrofurantoin in 2019 and 2020.  This is described below.  Question if you can for this there is      Simple office walk 185 feet x  3 laps goal with forehead probe 08/19/2019   O2 used ra  Number laps completed Attempted 3 but desaturated at 2  Comments about pace Normal pace  Resting Pulse Ox/HR 100% and 101/min  Final Pulse Ox/HR 86% and 116/min  Desaturated </= 88% yes  Desaturated <= 3% points Yes , 14  Got Tachycardic >/= 90/min yes  Symptoms at end of test dyspneic  Miscellaneous comments Corrected with  2L [place]    Nuclear medicine cardiac stress test March 2016: Overall Impression:  Normal stress nuclear study with a small, mild,  fixed apical defect consistent with thinning; no ischemia. LV Wall Motion:  NL LV Function; NL Wall Motion   CT abdomen lung cuts September 2020: Reported as normal.  I personally visualized and retrospect looks hazy and whether this is atelectasis or some other findings I do not know.  Jul 19, 2019:: Personally visualized shows findings of ILD.  I am not able to see a prior chest x-ray in the system or CT scan of the chest    Results for JAZMEN, LINDENBAUM "PAM" (MRN [id number]) as of 08/19/2019 15:18  Ref. Range 03/26/2019 13:45  Creatinine Latest Ref Range: 0.40 - 1.20 mg/dL 1.05   ROS - per HPI Results for KIMBERLEY, SPEECE "PAM" (MRN [id number]) as of 08/19/2019 15:18  Ref. Range 03/26/2019 13:45  Hemoglobin Latest Ref Range: 12.0 - 15.0 g/dL 14.0     OV 09/10/2019  Subjective:  Patient ID: Susan Hunt, female , DOB: 1946/01/12 , age 1 y.o. , MRN: [id number] , ADDRESS: [address] [place] [address]   09/10/2019 -   Chief Complaint  Patient presents with  . Follow-up    shortness of breath with exertion and non-productive cough     HPI TARON CONREY 75 y.o. -presents for follow-up to discuss results of dyspnea work-up.  High-resolution CT chest was personally visualized.  It shows presence of interstitial lung disease.  It needs Fleischner criteria for diagnosis not consistent with UIP.  Per ATS this is alternative diagnosis to UIP.  Radiologist raised the concern of hypersensitive pneumonitis but there is no air-trapping.  The other possibilities NSIP.  She is no longer on nitrofurantoin.  In any event unit she only took it intermittently.  Overall she is stable since last visit.  She had autoimmune profile.  Is only trace positive for ANA.  Her IgE is slightly elevated.  Her PFT shows mild restriction with reduction in diffusion capacity.  We went over her exposure history again.  This time she did recollect being exposed to mold for 15 to 20 years up until 1980  while living in [place] and working at [employer].  She does not recollect mold at behavioral [facility] where she worked recently until she retired.  However another patient of mine did recollect mold in this building at [employer].   Low risk cardiac stress test in 2016 but with ejection fraction 54%.-Since then no overall change in health.  Husband inquired about the low ejection fraction.  In terms of symptoms: She is try to get portable oxygen system.  She is switching company to [employer] because of the shortage of portable oxygen systems at [facility].     OV 11/12/2019   Subjective:  Patient ID: Susan Hunt, female , DOB: January 17, 1946, age 26 y.o. years. , MRN: [id number],  ADDRESS: [place] [address] PCP  Leamon Arnt, MD Providers : Treatment Team:  Attending Provider: Brand Males, MD Thoracic surgeon: Dr. Roxan Hockey Cardiologist Dr. Margaretann Loveless  Chief Complaint  Patient presents with  . Follow-up    SOB    Follow-up interstitial lung disease not otherwise specified   HPI ARIANNIE PENALOZA 75 y.o. -returns for follow-up.  Her husband is with her.  After her last visit she saw cardiothoracic surgery but we had to hold the surgical lung biopsy because her echocardiogram from September 13, 2019 showed a drop in left ventricular systolic ejection fraction.  Since then she is seen cardiology.  She had a heart catheterization that shows ejection action 40% still low.  Nonobstructive coronary artery disease.  She is followed up with cardiology and she has been started on beta-blocker.  On her father medications was also adjusted.  So far no change in dyspnea.  She tells me overall dyspnea is the same without any change.  Symptom scores shows a two-point shift with  worsening but she told the medical assistant that she is feeling worse.  Walking desaturation test today shows - normalcy .  She wants portable oxygen but the first DME company [facility]  did not have portable oxygen tanks.  She is found another company [employer] that is able to give her portable oxygen.  She needs a requalifying walk that is documented below.  In terms of surgical lung biopsy: She did have some prominent mediastinal nodes.  It appears Dr. Roxan Hockey has informed them that he will do a EBUS for this.  They have also been told by cardiology that from a cardiac standpoint patient would be a suitable candidate for surgical lung biopsy.  The husband wanted to know if a surgical biopsy is indicated and also my perspective and safety.  SYMPTOM SCALE - ILD 08/19/2019  11/12/2019  12/27/2019   O2 use ra ra ra  Shortness of Breath 0 -> 5 scale with 5 being worst (score 6 If unable to do)    At rest 1 1 0  Simple tasks - showers, clothes change, eating, shaving _0 Household (dishes, doing bed, laundry) _1 Shopping _2 Walking level at own pace _3 Walking up Stairs _4 Total (30-36) Dyspnea Score _5 How bad is your cough? x 2 0  How bad is your fatigue x 5 1  How bad is nausea x 1 0  How bad is vomiting?  x 1 0  How bad is diarrhea? x 0 0  How bad is anxiety? x 0 -0  How bad is depression x 0 0     Simple office walk 185 feet x  3 laps goal with forehead probe 08/19/2019  11/12/2019   O2 used ra ra  Number laps completed Attempted 3 but desaturated at 2 Did alll 3 on this repeat walk  Comments about pace Normal pace   Resting Pulse Ox/HR 100% and 101/min 96% and 60/min  Final Pulse Ox/HR 86% and 116/min 94% and 80/min  Desaturated </= 88% yes no  Desaturated <= 3% points Yes , 14 no  Got Tachycardic >/= 90/min yes no  Symptoms at end of test dyspneic mild  Miscellaneous comments Corrected with  2L [place] Will return o2   ROS - per HPI  xxxxxxxxxxxxxxxxxxxxxxxxxx Results for ROSSLYN, PASION "PAM" (MRN [id number]) as of 09/10/2019 13:56  Ref. Range 08/23/2019 10:53  FVC-Pre Latest Units: L 2.61  FVC-%Pred-Pre Latest Units: % 80   FEV1-Pre Latest Units: L 2.26  FEV1-%Pred-Pre Latest Units: % 92  Results for RIFKA, RAMEY "PAM" (MRN [id number]) as of 09/10/2019 13:56  Ref. Range 08/23/2019 10:53  DLCO cor Latest Units: ml/min/mmHg 13.76  DLCO cor % pred Latest Units: % 64   Results for TYEASHA, EBBS "PAM" (MRN [id number]) as of 09/10/2019 13:56  Ref. Range 08/23/2019 10:53  TLC Latest Units: L 4.15  TLC % pred Latest Units: % 75   xxxxxxxxxxxxxxxxxxxxxx  CT chest high resolution June 2021 Lungs/Pleura: Mild pulmonary fibrosis in a pattern with apical predominance, featuring irregular peripheral interstitial opacity and scattered areas of subpleural bronchiolectasis. Mild, tubular bronchiectasis throughout. No significant air trapping on expiratory phase imaging. No pleural effusion or pneumothorax.  Upper Abdomen: No acute abnormality.  Musculoskeletal: No chest wall mass or suspicious bone lesions identified.  IMPRESSION: 1. Mild pulmonary fibrosis in a pattern with apical predominance, featuring irregular peripheral interstitial opacity and scattered  areas of subpleural bronchiolectasis. Mild, tubular bronchiectasis throughout. Findings are most consistent with an "alternative diagnosis" pattern by ATS pulmonary fibrosis criteria, leading differential considerations chronic fibrotic hypersensitivity pneumonitis or NSIP. 2. Prominent mediastinal lymph nodes, nonspecific and likely reactive. 3. Coronary artery disease.  Aortic Atherosclerosis (ICD10-I70.0).   Electronically Signed   By: Eddie Candle M.D.   On: 09/04/2019 15:37    xxxxxxxxxxxxxxxxxxxxxxxx[id number]  Results for AMBUR, PROVINCE "PAM" (MRN [id number]) as of 09/10/2019 13:56  Ref. Range 08/19/2019 16:30 08/19/2019 16:30  SEE BELOW Unknown  Comment  Anti Nuclear Antibody (ANA) Latest Ref Range: NEGATIVE  POSITIVE (A)   ANA Pattern 1 Unknown Cytoplasmic (A)   ANA Titer 1 Unknown 1:80 (H) Negative   Angiotensin-Converting Enzyme Latest Ref Range: 9 - 67 U/L 35   Anti JO-1 Latest Ref Range: 0.0 - 0.9 AI  <0.2  CENTROMERE AB SCREEN Latest Ref Range: 0.0 - 0.9 AI  <5.3  Cyclic Citrullin Peptide Ab Latest Units: UNITS <16   dsDNA Ab Latest Ref Range: 0 - 9 IU/mL  5  ENA RNP Ab Latest Ref Range: 0.0 - 0.9 AI  0.6  ENA SSA (RO) Ab Latest Ref Range: 0.0 - 0.9 AI  <0.2  ENA SSB (LA) Ab Latest Ref Range: 0.0 - 0.9 AI  <0.2  Myeloperoxidase Abs Latest Units: AI <1.0   Serine Protease 3 Latest Units: AI <1.0   RA Latex Turbid. Latest Ref Range: <14 IU/mL <14   ENA SM Ab Ser-aCnc Latest Ref Range: 0.0 - 0.9 AI  <0.2  Chromatin Ab SerPl-aCnc Latest Ref Range: 0.0 - 0.9 AI  <0.2     has a past medical history of Asthma, Depression, Emphysema of lung ([place]), Hemorrhoids, Hyperlipidemia, Hypertension, Hypothyroidism, Obesity, Rosacea, and Tubular adenoma of colon (03/26/2019).  IMPRESSIONS   ECHO 09/13/19 1. Left ventricular ejection fraction, by estimation, is 30 to 35%. The  left ventricle has moderately decreased function. The left ventricle  demonstrates global hypokinesis. Apex poorly visualized, consider repeat  limited echo with contrast to rule out  apical thrombus. The left ventricular internal cavity size was moderately  dilated. Left ventricular diastolic parameters are consistent with Grade I  diastolic dysfunction (impaired relaxation).  2. Right ventricular systolic function is normal. The right ventricular  size is normal. Tricuspid regurgitation signal is inadequate for assessing  PA pressure.  3. The mitral valve is normal in structure. Trivial mitral valve  regurgitation.  4. The aortic valve was not well visualized. Aortic valve regurgitation  is not visualized. No aortic stenosis is present.  CATH 8?20/21  Prox LAD to Mid LAD lesion is 30% stenosed.  Ost Cx to Prox Cx lesion is 35% stenosed.  Prox RCA lesion is 35% stenosed.  There is moderate left ventricular  systolic dysfunction.  LV end diastolic pressure is normal.  The left ventricular ejection fraction is 35-45% by visual estimate.   1. Nonobstructive CAD 2. Moderate LV dysfunction. EF estimated at 40% with global hypokinesis 3. Normal LV filling pressures 4. Normal right heart pressures 5. Normal cardiac output.   Plan: medical therapy for LV dysfunction and lipid lowering therapy for nonobstructive CAD.    OV 12/27/2019  Subjective:  Patient ID: Susan Hunt, female , DOB: [birth year] , age 60 y.o. , MRN: [id number] , ADDRESS: [place] [address] PCP Leamon Arnt, MD Patient Care Team: Leamon Arnt, MD as PCP - General (Family Medicine) Elroy Channel, MD as Referring Physician (Internal Medicine)  Parker-Autry, Candace as Consulting Physician (Obstetrics and Gynecology) Trinda Pascal, FNP as Consulting Physician (Urology) Brand Males, MD as Consulting Physician (Pulmonary Disease)  This Provider for this visit: Treatment Team:  Attending Provider: Brand Males, MD    12/27/2019 -   Chief Complaint  Patient presents with  . Follow-up    discuss biopsy results. SOB sometimes. denies cough or wheezing.     HPI SEANNA SISLER 75 y.o. --follow-up interstitial lung disease.  Had surgical biopsy December 13, 2019.  After that she is was in the hospital requiring oxygen but then got discharged without oxygen.  At this point time she is much improved but still having soreness from her chest incision site.  Husband and she are here.  They are concerned about some postoperative atelectasis that was seen.  I visualized the x-ray.  Today walking desaturation test is normal.  I reassured them about this.  But I will give her incentive spirometer.  The main issue is to discuss the biopsy results: The bronchoalveolar lavage shows mixed cellularity with neutrophilia.  The surgical lung biopsy itself shows UIP with just 1 carcinoid tumor.   Reviewing her clinical history this UIP pattern is now consistent with IPF.  I gave her the diagnosis.  Regarding the carcinoid tumor let: I think this is an incidental finding.  There is no  DIPNECH syndrome  Her current symptom and walk profile are stable.   Results for ANGELEIGH, CHIASSON "PAM" (MRN [id number]) as of 12/27/2019 12:06  Ref. Range 12/13/2019 08:05  Color, Fluid Latest Ref Range: YELLOW  COLORLESS (A)  Total Nucleated Cell Count, Fluid Latest Ref Range: 0 - 1,000 cu mm 18  Lymphs, Fluid Latest Units: % 25  Eos, Fluid Latest Units: % 8  Appearance, Fluid Latest Ref Range: CLEAR  HAZY (A)  Other Cells, Fluid Latest Units: % MESOTHELIAL CELLS NOTED  Neutrophil Count, Fluid Latest Ref Range: 0 - 25 % 44 (H)  Monocyte-Macrophage-Serous Fluid Latest Ref Range: 50 - 90 % 23 (L)    OV 02/04/2020   Subjective:  Patient ID: Susan Hunt, female , DOB: 09/01/1945, age 46 y.o. years. , MRN: [id number],  ADDRESS: [place] [address] PCP  Leamon Arnt, MD Providers : Treatment Team:  Attending Provider: Brand Males, MD Patient Care Team: Leamon Arnt, MD as PCP - General (Family Medicine) Elroy Channel, MD as Referring Physician (Internal Medicine) Rockey Situ as Consulting Physician (Obstetrics and Gynecology) Trinda Pascal, FNP as Consulting Physician (Urology) Brand Males, MD as Consulting Physician (Pulmonary Disease)    Chief Complaint  Patient presents with  . Follow-up    ILD, doing well   Follow-up idiopathic pulmonary fibrosis diagnosed on surgical lung biopsy 12/13/2019.  Diagnosis formally given on 12/27/2019 Commenced pirfenidone late October 2021/early November 2021     HPI KENNY STERN 75 y.o. -returns for follow-up with her husband.  This visit is mainly to focus on pirfenidone uptake.  She has been taking pirfenidone for 3 weeks or more.  She is currently on full dose 3 pills 3 times a day.   The husband wants to know when we would roll to a full largest x1 pill 3 times daily.  I indicated to them that it would be 3 to 4 months of stability with pirfenidone before we made the decision.  Currently she is tolerating pirfenidone well.  She did have some mild nausea and she thinks it is because she did  not take a full meal.  Overall she is handling pirfenidone well without any problems.  No fatigue no weight loss.  Respiratory wise she is stable.  She did get in touch with a local support group.  She is asking about pulmonary rehabilitation referral and she is interested in this.  One of the main issues they wanted to discuss was the fact the pirfenidone refill in the supply chain was not consistent.  There was anxiety that they were not going to get the refill.  She had to call the office.  She believes that it is because she made the call to the office that the refill was sent.  She says she tried to call [place] without any response for a long time.  I have indicated to her that I will ask our pharmacist to review and explained the logistics.  She will have a repeat LFT today   PFT Results Latest Ref Rng & Units 08/23/2019  FVC-Pre L 2.61  FVC-Predicted Pre % 80  FVC-Post L 2.57  FVC-Predicted Post % 79  Pre FEV1/FVC % % 87  Post FEV1/FCV % % 92  FEV1-Pre L 2.26  FEV1-Predicted Pre % 92  FEV1-Post L 2.36  DLCO uncorrected ml/min/mmHg 13.84  DLCO UNC% % 65  DLCO corrected ml/min/mmHg 13.76  DLCO COR %Predicted % 64  DLVA Predicted % 88  TLC L 4.15  TLC % Predicted % 75  RV % Predicted % 63      OV 04/02/2020  Subjective:  Patient ID: Susan Hunt, female , DOB: March 24, 1945 , age 48 y.o. , MRN: [id number] , ADDRESS: [place] [address] PCP Leamon Arnt, MD Patient Care Team: Leamon Arnt, MD as PCP - General (Family Medicine) Elroy Channel, MD as Referring Physician (Internal Medicine) Rockey Situ as Consulting Physician (Obstetrics and  Gynecology) Trinda Pascal, FNP as Consulting Physician (Urology) Brand Males, MD as Consulting Physician (Pulmonary Disease)  This Provider for this visit: Treatment Team:  Attending Provider: Brand Males, MD    04/02/2020 -   Chief Complaint  Patient presents with  . Follow-up    Pt states she has been doing well since last visit and denies any complaints.   Follow-up idiopathic pulmonary fibrosis diagnosed on surgical lung biopsy 12/13/2019.  Diagnosis formally given on 12/27/2019 Commenced pirfenidone late October 2021/early November 2021  HPI KATRINIA STRAKER 75 y.o. -returns for follow-up.  Presents with her husband.  She is tolerating pirfenidone fine with just very mild occasional nausea.  She wants to roll over to the 1 pill 3 times daily.  I have sent a message through secure chat to the pharmacist.  She is going to start pulmonary rehabilitation in February 2022.  We discussed lung transplantation but at this point we will hold off.  She will check liver function test today.    There are no new issues.  She is already touch base with the support group her symptom score shows stability.  Of note she has returned to portable oxygen because she feels she does not need it.  Walking desaturation test confirms that.    She is interested in clinical trials.  She is touch base with cardiology and looking at a heart failure trial there.  She is also given her name for pulmonary fibrosis trials but currently there is a backlog.  I then emailed Berneda Rose cardiology Geophysicist/field seismologist and she replied saying, "She was in our [facility] trial which her  enrollment is complete. I am not aware of any other trial we are screening her for currently but will ask the team. I think IPF trial option would be in her best interest. I also think IPF will exclude her from our HF device trials.  "   SYMPTOM SCALE - ILD.td  08/19/2019  11/12/2019  12/27/2019 Last Weight  Most recent update:  12/27/2019 11:48 AM    Weight  89.7 kg (197 lb 12.8 oz)            02/04/2020 196#  04/02/2020 197#  O2 use _0   Shortness of Breath 0 -> 5 scale with 5 being worst (score 6 If unable to do)      At rest 1 1 0 0 0  Simple tasks - showers, clothes change, eating, shaving _1 Household (dishes, doing bed, laundry) _2 Shopping _3 Walking level at own pace _4 0  Walking up Stairs _5 Total (30-36) Dyspnea Score _6 How bad is your cough? x 2 0 0 0  How bad is your fatigue x _7 How bad is nausea x 1 0 1 1  How bad is vomiting?  x 1 0 0 0  How bad is diarrhea? x 0 0 0 0  How bad is anxiety? x 0 0 0 0  How bad is depression x 0 0 0 0     Simple office walk 185 feet x  3 laps goal with forehead probe 08/19/2019  11/12/2019  12/27/19 BRL 04/02/2020 gso - she has returned her portabl o2  O2 used ra ra ra ra  Number laps completed Attempted 3 but desaturated at 2 Did alll 3 on this repeat walk - on beta blocker    Comments about pace Normal pace  Nl pace avg  Resting Pulse Ox/HR 100% and 101/min 96% and 60/min 94% and 71/min 100% and 60  Final Pulse Ox/HR 86% and 116/min 94% and 80/min 96% and 87/min 94% ns 79  Desaturated </= 88% yes no    Desaturated <= 3% points Yes , 14 no    Got Tachycardic >/= 90/min yes no    Symptoms at end of test dyspneic mild none noon  Miscellaneous comments Corrected with  2L [place] Will return o2 back to dme         PFT  PFT Results Latest Ref Rng & Units 08/23/2019  FVC-Pre L 2.61  FVC-Predicted Pre % 80  FVC-Post L 2.57  FVC-Predicted Post % 79  Pre FEV1/FVC % % 87  Post FEV1/FCV % % 92  FEV1-Pre L 2.26  FEV1-Predicted Pre % 92  FEV1-Post L 2.36  DLCO uncorrected ml/min/mmHg 13.84  DLCO UNC% % 65  DLCO corrected ml/min/mmHg 13.76  DLCO COR %Predicted % 64  DLVA Predicted % 88  TLC L 4.15  TLC % Predicted % 75  RV % Predicted % 63       has a past medical history of  Anemia, Asthma, CHF (congestive heart failure) ([place]), Depression, Emphysema of lung ([place]), GERD (gastroesophageal reflux disease), Hemorrhoids, History of kidney stones, Hyperlipidemia, Hypertension, Hypothyroidism, Obesity, Pre-diabetes, Rosacea, and Tubular adenoma of colon (03/26/2019).   reports that she quit smoking about 6 years ago. Her smoking use included cigarettes. She has a 7.50 pack-year smoking history. She has never  used smokeless tobacco.  Past Surgical History:  Procedure Laterality Date  . ABDOMINAL HYSTERECTOMY    . ANTERIOR AND POSTERIOR VAGINAL REPAIR  2020  . CARDIAC CATHETERIZATION  11/01/2019  . CATARACT EXTRACTION W/ INTRAOCULAR LENS IMPLANT Bilateral 2016  . COLONOSCOPY    . CYSTOSCOPY    . DENTAL SURGERY  2016   Dental Implant  . EYE SURGERY Bilateral    cataract  . FRACTURE SURGERY Left 2013   Left ankle  . INTERCOSTAL NERVE BLOCK Right 12/13/2019   Procedure: INTERCOSTAL NERVE BLOCK;  Surgeon: Melrose Nakayama, MD;  Location: [facility];  Service: Thoracic;  Laterality: Right;  . LUNG BIOPSY Right 12/13/2019   Procedure: LUNG BIOPSY;  Surgeon: Melrose Nakayama, MD;  Location: [place] [facility];  Service: Thoracic;  Laterality: Right;  . LYMPH NODE BIOPSY Right 12/13/2019   Procedure: LYMPH NODE BIOPSY;  Surgeon: Melrose Nakayama, MD;  Location: [facility];  Service: Thoracic;  Laterality: Right;  . medtronix nerve stimulator  07/18/2019   Complete InterStim Sacral Neuromudulation system implantation 07/18/19, [facility]  . NASAL SINUS SURGERY    . RIGHT/LEFT HEART CATH AND CORONARY ANGIOGRAPHY N/A 11/01/2019   Procedure: RIGHT/LEFT HEART CATH AND CORONARY ANGIOGRAPHY;  Surgeon: Martinique, Peter M, MD;  Location: [facility];  Service: Cardiovascular;  Laterality: N/A;  . VAGINECTOMY, PARTIAL    . VIDEO BRONCHOSCOPY N/A 12/13/2019   Procedure: VIDEO BRONCHOSCOPY;  Surgeon: Melrose Nakayama, MD;  Location: [facility];  Service: Thoracic;  Laterality: N/A;    Allergies   Allergen Reactions  . Ace Inhibitors Cough  . Clarithromycin     REACTION: GI sx  . Codeine Nausea Only  . Telmisartan Other (See Comments)    sleepy    Immunization History  Administered Date(s) Administered  . Fluad Quad(high Dose 65+) 12/09/2019  . Hepatitis A, Adult 04/06/2018  . Influenza Split 01/06/2009  . Influenza, High Dose Seasonal PF 12/17/2014, 12/20/2015, 01/15/2017, 12/18/2017, 11/28/2018  . Influenza, Seasonal, Injecte, Preservative Fre 01/07/2014, 12/17/2014  . Influenza,inj,Quad PF,6+ Mos 01/15/2017  . Influenza,trivalent, recombinat, inj, PF 12/03/2012  . Influenza-Unspecified 01/15/2017  . Moderna SARS-COV2 Booster Vaccination 01/07/2020  . Moderna Sars-Covid-2 Vaccination 04/17/2019, 05/15/2019  . Pneumococcal Conjugate-13 01/07/2014  . Pneumococcal Polysaccharide-23 11/23/2011  . Td 03/14/2006, 10/16/2011  . Zoster 12/06/2013  . Zoster Recombinat (Shingrix) 10/21/2017, 12/18/2017    Family History  Problem Relation Age of Onset  . Heart disease Mother   . Hypertension Mother   . Osteoporosis Mother   . Stroke Mother   . Alcohol abuse Father   . Diabetes Maternal Grandmother   . Drug abuse Son   . Breast cancer Neg Hx      Current Outpatient Medications:  .  acetaminophen (TYLENOL) 500 MG tablet, Take 2 tablets (1,000 mg total) by mouth every 6 (six) hours as needed., Disp: 30 tablet, Rfl: 0 .  amLODipine (NORVASC) 5 MG tablet, Take 1 tablet (5 mg total) by mouth daily., Disp: 90 tablet, Rfl: 3 .  cetirizine (ZYRTEC) 10 MG tablet, Take 10 mg by mouth daily., Disp: , Rfl:  .  FARXIGA 10 MG TABS tablet, Take 10 mg by mouth daily., Disp: , Rfl:  .  fluticasone (FLONASE) 50 MCG/ACT nasal spray, Place 2 sprays into both nostrils at bedtime as needed (allergies.). , Disp: , Rfl:  .  levothyroxine (SYNTHROID) 125 MCG tablet, TAKE ONE TABLET BY MOUTH DAILY, Disp: 90 tablet, Rfl: 0 .  metoprolol succinate (TOPROL-XL) 100 MG 24 hr  tablet, Take 2 tablets  (200 mg total) by mouth daily., Disp: 90 tablet, Rfl: 3 .  montelukast (SINGULAIR) 10 MG tablet, TAKE ONE TABLET BY MOUTH EVERY NIGHT AT BEDTIME, Disp: 90 tablet, Rfl: 1 .  Multiple Vitamin (MULTIVITAMIN WITH MINERALS) TABS tablet, Take 1 tablet by mouth daily., Disp: , Rfl:  .  omeprazole (PRILOSEC) 20 MG capsule, TAKE ONE CAPSULE BY MOUTH DAILY, Disp: 90 capsule, Rfl: 3 .  ondansetron (ZOFRAN-ODT) 4 MG disintegrating tablet, DISSOLVE ONE TABLET BY MOUTH EVERY 8 HOURS AS NEEDED FOR NAUSEA/ VOMITING, Disp: 20 tablet, Rfl: 0 .  Pirfenidone (ESBRIET) 267 MG TABS, Take 3 tablets (801 mg total) by mouth in the morning, at noon, and at bedtime., Disp: 270 tablet, Rfl: 5 .  rosuvastatin (CRESTOR) 10 MG tablet, Take 1 tablet (10 mg total) by mouth daily., Disp: 90 tablet, Rfl: 3 .  spironolactone (ALDACTONE) 50 MG tablet, Take 1.5 tablets (75 mg total) by mouth daily., Disp: 45 tablet, Rfl: 5 .  valsartan (DIOVAN) 320 MG tablet, Take 1 tablet (320 mg total) by mouth daily., Disp: 90 tablet, Rfl: 3 .  venlafaxine XR (EFFEXOR-XR) 150 MG 24 hr capsule, Take 1 capsule (150 mg total) by mouth daily., Disp: 90 capsule, Rfl: 3 .  cephALEXin (KEFLEX) 250 MG capsule, Take 250 mg by mouth at bedtime.  (Patient not taking: Reported on 04/02/2020), Disp: , Rfl:       Objective:   Vitals:   04/02/20 1043  BP: 126/68  Pulse: (!) 58  Temp: 98 F (36.7 C)  TempSrc: Other (Comment)  SpO2: 96%  Weight: 197 lb 6.4 oz (89.5 kg)  Height: 5' 7" (1.702 m)    Estimated body mass index is 30.92 kg/m as calculated from the following:   Height as of this encounter: 5' 7" (1.702 m).   Weight as of this encounter: 197 lb 6.4 oz (89.5 kg).  _0 @  Filed Weights   04/02/20 1043  Weight: 197 lb 6.4 oz (89.5 kg)     Physical Exam   General: No distress. Looks well Neuro: Alert and Oriented x 3. GCS 15. Speech normal Psych: Pleasant Resp:  Barrel Chest - no.  Wheeze - no, Crackles - occ at base, No  overt respiratory distress CVS: Normal heart sounds. Murmurs - no Ext: Stigmata of Connective Tissue Disease - no HEENT: Normal upper airway. PEERL +. No post nasal drip        Assessment:       ICD-10-CM   1. IPF (idiopathic pulmonary fibrosis) (HCC)  J84.112 Hepatic function panel    Pulmonary function test    Hepatic function panel    Hepatic function panel  2. Encounter for therapeutic drug monitoring  Z51.81 Hepatic function panel    Hepatic function panel    Hepatic function panel       Plan:     Patient Instructions     ICD-10-CM   1. IPF (idiopathic pulmonary fibrosis) ([place])  J84.112   2. Encounter for therapeutic drug monitoring  Z51.81     -Clinically IPF is stable -Glad you are tolerating pirfenidone well except for very mild nausea  Plan -Continue pirfenidone/Esbriet at full dose of 3 pills 3 times daily  -I have sent a message to our pharmacist to change her to 1 pill 3 times daily  -Continue to apply sunscreen  -Check liver function test today  -Glad you are starting pulmonary rehabilitation February 2022   -Glad you are in touch with the  local support group  -We discussed lung transplantation but we will defer any referral today  -Glad you are interested in clinical trials in both cardiology and pulmonary -   -Currently  For IPF trials were in a backlog but we could look at the second quarter of 2022  Follow-up -3-4 months do spirometry and DLCO -3-4 months do liver function test -Return to see Dr. Chase Caller in 3-4 months  -30-minute visit; symptom score and walk test at follow-up       SIGNATURE    Dr. Brand Males, M.D., F.C.C.P,  Pulmonary and Critical Care Medicine Staff Physician, [place] Director - Interstitial Lung Disease  Program  [facility] [place], [place], [address]  Pager: [phone], If no answer or between  15:00h - 7:00h: call [phone] Telephone: [phone]  11:46 AM 04/02/2020

## 2020-04-02 NOTE — Patient Instructions (Addendum)
ICD-10-CM   1. IPF (idiopathic pulmonary fibrosis) ([place])  J84.112   2. Encounter for therapeutic drug monitoring  Z51.81     -Clinically IPF is stable -Glad you are tolerating pirfenidone well except for very mild nausea  Plan -Continue pirfenidone/Esbriet at full dose of 3 pills 3 times daily  -I have sent a message to our pharmacist to change her to 1 pill 3 times daily  -Continue to apply sunscreen  -Check liver function test today  -Glad you are starting pulmonary rehabilitation February 2022   -Glad you are in touch with the local support group  -We discussed lung transplantation but we will defer any referral today  -Glad you are interested in clinical trials in both cardiology and pulmonary -   -Currently  For IPF trials were in a backlog but we could look at the second quarter of 2022  Follow-up -3-4 months do spirometry and DLCO -3-4 months do liver function test -Return to see Dr. Chase Caller in 3-4 months  -30-minute visit; symptom score and walk test at follow-up

## 2020-04-02 NOTE — Telephone Encounter (Signed)
Rx for Esbriet 858m tab (1 tab three times daily) sent to [facility]. Called patient to notify. She plans to call sometime next week to schedule shipment.  Nothing further needed.  DKnox Saliva PharmD, MPH Clinical Pharmacist (Rheumatology and Pulmonology)

## 2020-04-06 ENCOUNTER — Telehealth (HOSPITAL_COMMUNITY): Payer: Self-pay | Admitting: *Deleted

## 2020-04-06 NOTE — Telephone Encounter (Signed)
Attempted to call patient regarding upcoming cardiac MRI appointment. Left message on voicemail with name and callback number  Gordy Clement RN Navigator Cardiac [facility] [phone] Office [phone] Cell

## 2020-04-08 ENCOUNTER — Ambulatory Visit (HOSPITAL_COMMUNITY)
Admission: RE | Admit: 2020-04-08 | Discharge: 2020-04-08 | Disposition: A | Discharge status: Home / Self Care | Payer: Medicare Other | Source: Ambulatory Visit | Attending: Internal Medicine | Admitting: Internal Medicine

## 2020-04-08 ENCOUNTER — Encounter (HOSPITAL_COMMUNITY): Payer: Self-pay

## 2020-04-08 ENCOUNTER — Other Ambulatory Visit: Payer: Self-pay

## 2020-04-08 DIAGNOSIS — I428 Other cardiomyopathies: Secondary | ICD-10-CM

## 2020-04-08 NOTE — Telephone Encounter (Signed)
Spoke with patient regarding remote for her stimulator. Patient states she is ordering a new remote from the company so that she can complete her MRI. Advised patient to either call the office or send a message through [place] and we can get her MRI rescheduled at that time. Patient verbalized understanding.

## 2020-04-09 ENCOUNTER — Telehealth (HOSPITAL_COMMUNITY): Payer: Self-pay

## 2020-04-09 NOTE — Telephone Encounter (Signed)
Called pt to confirm appointment with pulmonary rehab tomorrow 04/10/20 at 1030. LMTCB for confirmation.

## 2020-04-10 ENCOUNTER — Other Ambulatory Visit: Payer: Self-pay

## 2020-04-10 ENCOUNTER — Encounter (HOSPITAL_COMMUNITY)
Admission: RE | Admit: 2020-04-10 | Discharge: 2020-04-10 | Disposition: A | Discharge status: Home / Self Care | Payer: Medicare Other | Source: Ambulatory Visit | Attending: Internal Medicine | Admitting: Internal Medicine

## 2020-04-10 ENCOUNTER — Encounter (HOSPITAL_COMMUNITY): Payer: Self-pay

## 2020-04-10 VITALS — BP 126/60 | HR 59 | Ht 66.25 in | Wt 197.5 lb

## 2020-04-10 DIAGNOSIS — J84112 Idiopathic pulmonary fibrosis: Secondary | ICD-10-CM | POA: Diagnosis not present

## 2020-04-10 NOTE — Progress Notes (Signed)
Susan Hunt 75 y.o. female Pulmonary Rehab Orientation Note  Physical assessment reveals heart rate is normal, breath sounds  moderate rhonchi/crackles heard both lower lobes. Typical for patients who have ILD. Grip strength equal, strong. Distal pulses palpable with no swelling.  Susan Hunt is looking forward to Pulmonary rehab. Susan Hunt, BSN Cardiac and Training and development officer

## 2020-04-10 NOTE — Progress Notes (Signed)
Susan Hunt 75 y.o. female Pulmonary Rehab Orientation Note This patient who was referred to Pulmonary rehab by Dr. Chase Caller with the diagnosis of Idiopathic Pulmonary Fibrosis arrived today in Cardiac and Pulmonary Rehab. She arrived ambulatory with normal/steady gait. She does not carry portable oxygen. Per pt, she uses oxygen never. Pt was prescribed portable oxygen for exertion and never used it so she returned the equipment. Color good, skin warm and dry. Patient is oriented to time and place. Patient's medical history, psychosocial health, and medications reviewed. Psychosocial assessment reveals pt lives with their spouse. Pt is currently retired from Nursing. Pt reports her stress level is low. Areas of stress/anxiety include Health.  Pt does not exhibit signs of depression. PHQ2/9 score 0/0. Pt shows good  coping skills with positive outlook . [name] was offered emotional support and reassurance. Will continue to monitor and evaluate progress toward psychosocial goal(s) of improving quality of life. Patient reports she does take medications as prescribed. Patient states she follows a Regular diet. The patient reports no specific efforts to gain or lose weight. Patient's weight will be monitored closely. Demonstration and practice of PLB using pulse oximeter. Patient able to return demonstration satisfactorily. Safety and hand hygiene in the exercise area reviewed with patient. Patient voices understanding of the information reviewed. Department expectations discussed with patient and achievable goals were set. The patient shows enthusiasm about attending the program and we look forward to working with this nice lady. The patient completed a 6 min walk test today and is schedule to begin exercise on 04/14/20.  45 minutes was spent on a variety of activities such as assessment of the patient, obtaining baseline data including height, weight, BMI, and grip strength, verifying medical history,  allergies, and current medications, and teaching patient strategies for performing tasks with less respiratory effort with emphasis on pursed lip breathing.  Rick Duff MS, ACSM CEP

## 2020-04-10 NOTE — Progress Notes (Signed)
Pulmonary Individual Treatment Plan  Patient Details  Name: Susan Hunt MRN: [id number] Date of Birth: 05/07/1945 Referring Provider:   April Manson Pulmonary Rehab Walk Test from 04/10/2020 in [place]  Referring Provider Dr. Chase Caller      Initial Encounter Date:  Flowsheet Row Pulmonary Rehab Walk Test from 04/10/2020 in [place]  Date 04/10/20      Visit Diagnosis: IPF (idiopathic pulmonary fibrosis) ([place])  Patient's Home Medications on Admission:   Current Outpatient Medications:  .  acetaminophen (TYLENOL) 500 MG tablet, Take 2 tablets (1,000 mg total) by mouth every 6 (six) hours as needed., Disp: 30 tablet, Rfl: 0 .  amLODipine (NORVASC) 5 MG tablet, Take 1 tablet (5 mg total) by mouth daily., Disp: 90 tablet, Rfl: 3 .  cephALEXin (KEFLEX) 250 MG capsule, Take 250 mg by mouth at bedtime.  (Patient not taking: Reported on 04/02/2020), Disp: , Rfl:  .  cetirizine (ZYRTEC) 10 MG tablet, Take 10 mg by mouth daily., Disp: , Rfl:  .  FARXIGA 10 MG TABS tablet, Take 10 mg by mouth daily., Disp: , Rfl:  .  fluticasone (FLONASE) 50 MCG/ACT nasal spray, Place 2 sprays into both nostrils at bedtime as needed (allergies.). , Disp: , Rfl:  .  levothyroxine (SYNTHROID) 125 MCG tablet, TAKE ONE TABLET BY MOUTH DAILY, Disp: 90 tablet, Rfl: 0 .  metoprolol succinate (TOPROL-XL) 100 MG 24 hr tablet, Take 2 tablets (200 mg total) by mouth daily., Disp: 90 tablet, Rfl: 3 .  montelukast (SINGULAIR) 10 MG tablet, TAKE ONE TABLET BY MOUTH EVERY NIGHT AT BEDTIME, Disp: 90 tablet, Rfl: 1 .  Multiple Vitamin (MULTIVITAMIN WITH MINERALS) TABS tablet, Take 1 tablet by mouth daily., Disp: , Rfl:  .  omeprazole (PRILOSEC) 20 MG capsule, TAKE ONE CAPSULE BY MOUTH DAILY, Disp: 90 capsule, Rfl: 3 .  ondansetron (ZOFRAN-ODT) 4 MG disintegrating tablet, DISSOLVE ONE TABLET BY MOUTH EVERY 8 HOURS AS NEEDED FOR NAUSEA/ VOMITING, Disp: 20  tablet, Rfl: 0 .  Pirfenidone (ESBRIET) 801 MG TABS, Take 801 mg by mouth with breakfast, with lunch, and with evening meal., Disp: 90 tablet, Rfl: 3 .  rosuvastatin (CRESTOR) 10 MG tablet, Take 1 tablet (10 mg total) by mouth daily., Disp: 90 tablet, Rfl: 3 .  spironolactone (ALDACTONE) 50 MG tablet, Take 1.5 tablets (75 mg total) by mouth daily., Disp: 45 tablet, Rfl: 5 .  valsartan (DIOVAN) 320 MG tablet, Take 1 tablet (320 mg total) by mouth daily., Disp: 90 tablet, Rfl: 3 .  venlafaxine XR (EFFEXOR-XR) 150 MG 24 hr capsule, Take 1 capsule (150 mg total) by mouth daily., Disp: 90 capsule, Rfl: 3  Past Medical History: Past Medical History:  Diagnosis Date  . Anemia   . Asthma    "I dont think I have it.  . CHF (congestive heart failure) ([place])   . Depression   . Emphysema of lung ([place])   . GERD (gastroesophageal reflux disease)   . Hemorrhoids   . History of kidney stones    Cystoscopy  . Hyperlipidemia   . Hypertension   . Hypothyroidism   . Obesity   . Pre-diabetes   . Rosacea   . Tubular adenoma of colon 03/26/2019   Colonoscopy repeat in 3 years    Tobacco Use: Social History   Tobacco Use  Smoking Status Former Smoker  . Packs/day: 0.50  . Years: 15.00  . Pack years: 7.50  . Types: Cigarettes  .  Quit date: 08/18/2013  . Years since quitting: 6.6  Smokeless Tobacco Never Used    Labs: Recent Review Flowsheet Data    Labs for ITP Cardiac and Pulmonary Rehab Latest Ref Rng & Units 10/15/2019 11/01/2019 11/01/2019 12/09/2019 12/11/2019   Cholestrol <200 mg/dL - - - 155 -   LDLCALC mg/dL (calc) - - - 73 -   LDLDIRECT mg/dL - - - - -   HDL > OR = 50 mg/dL - - - 65 -   Trlycerides <150 mg/dL - - - 87 -   Hemoglobin A1c 4.0 - 5.6 % 6.8(H) - - 6.1(A) -   PHART 7.350 - 7.450 7.403 7.416 - - 7.418   PCO2ART 32.0 - 48.0 mmHg 36.1 42.6 - - 37.7   HCO3 20.0 - 28.0 mmol/L 22.1 27.4 26.8 - 23.8   TCO2 22 - 32 mmol/L - 29 28 - -   ACIDBASEDEF 0.0 - 2.0 mmol/L 2.0 - - - 0.1    O2SAT % 98.0 100.0 79.0 - 96.7      Capillary Blood Glucose: Lab Results  Component Value Date   GLUCAP 100 (H) 12/16/2019   GLUCAP 146 (H) 12/16/2019   GLUCAP 104 (H) 12/15/2019   GLUCAP 126 (H) 12/15/2019   GLUCAP 118 (H) 12/15/2019     Pulmonary Assessment Scores:  Pulmonary Assessment Scores    Row Name 04/10/20 1142         ADL UCSD   ADL Phase Entry     SOB Score total 47           CAT Score   CAT Score 17           mMRC Score   mMRC Score 2           UCSD: Self-administered rating of dyspnea associated with activities of daily living (ADLs) 6-point scale (0 = "not at all" to 5 = "maximal or unable to do because of breathlessness")  Scoring Scores range from 0 to 120.  Minimally important difference is 5 units  CAT: CAT can identify the health impairment of COPD patients and is better correlated with disease progression.  CAT has a scoring range of zero to 40. The CAT score is classified into four groups of low (less than 10), medium (10 - 20), high (21-30) and very high (31-40) based on the impact level of disease on health status. A CAT score over 10 suggests significant symptoms.  A worsening CAT score could be explained by an exacerbation, poor medication adherence, poor inhaler technique, or progression of COPD or comorbid conditions.  CAT MCID is 2 points  mMRC: mMRC (Modified Medical Research Council) Dyspnea Scale is used to assess the degree of baseline functional disability in patients of respiratory disease due to dyspnea. No minimal important difference is established. A decrease in score of 1 point or greater is considered a positive change.   Pulmonary Function Assessment:  Pulmonary Function Assessment - 04/10/20 1103      Breath   Shortness of Breath Yes;Limiting activity           Exercise Target Goals: Exercise Program Goal: Individual exercise prescription set using results from initial 6 min walk test and THRR while considering   patient's activity barriers and safety.   Exercise Prescription Goal: Initial exercise prescription builds to 30-45 minutes a day of aerobic activity, 2-3 days per week.  Home exercise guidelines will be given to patient during program as part of exercise prescription that the participant  will acknowledge.  Activity Barriers & Risk Stratification:  Activity Barriers & Cardiac Risk Stratification - 04/10/20 1101      Activity Barriers & Cardiac Risk Stratification   Activity Barriers Deconditioning;Shortness of Breath    Cardiac Risk Stratification Moderate           6 Minute Walk:  6 Minute Walk    Row Name 04/10/20 1151         6 Minute Walk   Phase Initial     Distance 1209 feet     Walk Time 6 minutes     # of Rest Breaks 0     MPH 2.29     METS 2.11     RPE 11     Perceived Dyspnea  2     VO2 Peak 7.39     Symptoms No     Resting HR 59 bpm     Resting BP 126/60     Resting Oxygen Saturation  95 %     Exercise Oxygen Saturation  during 6 min walk 90 %     Max Ex. HR 82 bpm     Max Ex. BP 142/66     2 Minute Post BP 140/66           Interval HR   1 Minute HR 74     2 Minute HR 72     3 Minute HR 76     4 Minute HR 82     5 Minute HR 77     6 Minute HR 79     2 Minute Post HR 63     Interval Heart Rate? Yes           Interval Oxygen   Interval Oxygen? Yes     Baseline Oxygen Saturation % 95 %     1 Minute Oxygen Saturation % 96 %     1 Minute Liters of Oxygen 0 L     2 Minute Oxygen Saturation % 92 %     2 Minute Liters of Oxygen 0 L     3 Minute Oxygen Saturation % 91 %     3 Minute Liters of Oxygen 0 L     4 Minute Oxygen Saturation % 91 %     4 Minute Liters of Oxygen 0 L     5 Minute Oxygen Saturation % 90 %     5 Minute Liters of Oxygen 0 L     6 Minute Oxygen Saturation % 91 %     6 Minute Liters of Oxygen 0 L     2 Minute Post Oxygen Saturation % 95 %     2 Minute Post Liters of Oxygen 0 L            Oxygen Initial Assessment:   Oxygen Initial Assessment - 04/10/20 1137      Home Oxygen   Home Oxygen Device None    Sleep Oxygen Prescription None    Home Exercise Oxygen Prescription None    Home Resting Oxygen Prescription None      Initial 6 min Walk   Oxygen Used None      Program Oxygen Prescription   Program Oxygen Prescription None      Intervention   Short Term Goals To learn and understand importance of monitoring SPO2 with pulse oximeter and demonstrate accurate use of the pulse oximeter.;To learn and understand importance of maintaining oxygen saturations>88%;To learn and demonstrate proper pursed lip breathing techniques  or other breathing techniques.;To learn and demonstrate proper use of respiratory medications    Long  Term Goals Verbalizes importance of monitoring SPO2 with pulse oximeter and return demonstration;Maintenance of O2 saturations>88%;Exhibits proper breathing techniques, such as pursed lip breathing or other method taught during program session;Compliance with respiratory medication;Demonstrates proper use of MDI's           Oxygen Re-Evaluation:   Oxygen Discharge (Final Oxygen Re-Evaluation):   Initial Exercise Prescription:  Initial Exercise Prescription - 04/10/20 1100      Date of Initial Exercise RX and Referring Provider   Date 04/10/20    Referring Provider Dr. Chase Caller    Expected Discharge Date 06/11/20      NuStep   Level 2    Minutes 15      Track   Minutes 15      Prescription Details   Frequency (times per week) 2    Duration Progress to 30 minutes of continuous aerobic without signs/symptoms of physical distress      Intensity   THRR 40-80% of Max Heartrate 58-116    Ratings of Perceived Exertion 11-13    Perceived Dyspnea 0-4      Progression   Progression Continue to progress workloads to maintain intensity without signs/symptoms of physical distress.      Resistance Training   Training Prescription Yes    Weight Orange bands    Reps 10-15            Perform Capillary Blood Glucose checks as needed.  Exercise Prescription Changes:   Exercise Comments:   Exercise Goals and Review:  Exercise Goals    Row Name 04/10/20 1137             Exercise Goals   Increase Physical Activity Yes       Intervention Provide advice, education, support and counseling about physical activity/exercise needs.;Develop an individualized exercise prescription for aerobic and resistive training based on initial evaluation findings, risk stratification, comorbidities and participant's personal goals.       Expected Outcomes Short Term: Attend rehab on a regular basis to increase amount of physical activity.;Long Term: Add in home exercise to make exercise part of routine and to increase amount of physical activity.;Long Term: Exercising regularly at least 3-5 days a week.       Increase Strength and Stamina Yes       Intervention Provide advice, education, support and counseling about physical activity/exercise needs.;Develop an individualized exercise prescription for aerobic and resistive training based on initial evaluation findings, risk stratification, comorbidities and participant's personal goals.       Expected Outcomes Short Term: Increase workloads from initial exercise prescription for resistance, speed, and METs.;Short Term: Perform resistance training exercises routinely during rehab and add in resistance training at home;Long Term: Improve cardiorespiratory fitness, muscular endurance and strength as measured by increased METs and functional capacity (6MWT)       Able to understand and use rate of perceived exertion (RPE) scale Yes       Intervention Provide education and explanation on how to use RPE scale       Expected Outcomes Short Term: Able to use RPE daily in rehab to express subjective intensity level;Long Term:  Able to use RPE to guide intensity level when exercising independently       Able to understand and use Dyspnea  scale Yes       Intervention Provide education and explanation on how to use Dyspnea scale  Expected Outcomes Short Term: Able to use Dyspnea scale daily in rehab to express subjective sense of shortness of breath during exertion;Long Term: Able to use Dyspnea scale to guide intensity level when exercising independently       Knowledge and understanding of Target Heart Rate Range (THRR) Yes       Intervention Provide education and explanation of THRR including how the numbers were predicted and where they are located for reference       Expected Outcomes Short Term: Able to state/look up THRR;Long Term: Able to use THRR to govern intensity when exercising independently;Short Term: Able to use daily as guideline for intensity in rehab       Understanding of Exercise Prescription Yes       Intervention Provide education, explanation, and written materials on patient's individual exercise prescription       Expected Outcomes Short Term: Able to explain program exercise prescription;Long Term: Able to explain home exercise prescription to exercise independently              Exercise Goals Re-Evaluation :   Discharge Exercise Prescription (Final Exercise Prescription Changes):   Nutrition:  Target Goals: Understanding of nutrition guidelines, daily intake of sodium <1511m, cholesterol <2074m calories 30% from fat and 7% or less from saturated fats, daily to have 5 or more servings of fruits and vegetables.  Biometrics:  Pre Biometrics - 04/10/20 1137      Pre Biometrics   Grip Strength 26 kg            Nutrition Therapy Plan and Nutrition Goals:   Nutrition Assessments:  MEDIFICTS Score Key:  ?70 Need to make dietary changes   40-70 Heart Healthy Diet  ? 40 Therapeutic Level Cholesterol Diet   Picture Your Plate Scores:  <4<19nhealthy dietary pattern with much room for improvement.  41-50 Dietary pattern unlikely to meet recommendations for good health and room  for improvement.  51-60 More healthful dietary pattern, with some room for improvement.   >60 Healthy dietary pattern, although there may be some specific behaviors that could be improved.    Nutrition Goals Re-Evaluation:   Nutrition Goals Discharge (Final Nutrition Goals Re-Evaluation):   Psychosocial: Target Goals: Acknowledge presence or absence of significant depression and/or stress, maximize coping skills, provide positive support system. Participant is able to verbalize types and ability to use techniques and skills needed for reducing stress and depression.  Initial Review & Psychosocial Screening:  Initial Psych Review & Screening - 04/10/20 1138      Screening Interventions   Expected Outcomes Long Term goal: The participant improves quality of Life and PHQ9 Scores as seen by post scores and/or verbalization of changes           Quality of Life Scores:  Scores of 19 and below usually indicate a poorer quality of life in these areas.  A difference of  2-3 points is a clinically meaningful difference.  A difference of 2-3 points in the total score of the Quality of Life Index has been associated with significant improvement in overall quality of life, self-image, physical symptoms, and general health in studies assessing change in quality of life.  PHQ-9: Recent Review Flowsheet Data    [facility] 04/10/2020 03/26/2019 10/02/2017 02/14/2017   Decreased Interest 0 0 0 0   Down, Depressed, Hopeless 0 0 0 0   PHQ - 2 Score 0 0 0 0   Altered sleeping 0 0 0 -   Tired,  decreased energy 0 3 0 -   Change in appetite 0 3 0 -   Feeling bad or failure about yourself  0 0 0 -   Trouble concentrating 0 0 0 -   Moving slowly or fidgety/restless 0 1 0 -   Suicidal thoughts 0 0 0 -   PHQ-9 Score 0 7 0 -   Difficult doing work/chores Not difficult at all Not difficult at all Not difficult at all -     Interpretation of Total Score  Total Score Depression Severity:   1-4 = Minimal depression, 5-9 = Mild depression, 10-14 = Moderate depression, 15-19 = Moderately severe depression, 20-27 = Severe depression   Psychosocial Evaluation and Intervention:   Psychosocial Re-Evaluation:   Psychosocial Discharge (Final Psychosocial Re-Evaluation):   Education: Education Goals: Education classes will be provided on a weekly basis, covering required topics. Participant will state understanding/return demonstration of topics presented.  Learning Barriers/Preferences:  Learning Barriers/Preferences - 04/10/20 1104      Learning Barriers/Preferences   Learning Barriers Hearing   Has hearing aides and does not use them   Learning Preferences Verbal Instruction;Audio;Video           Education Topics: Risk Factor Reduction:  -Group instruction that is supported by a PowerPoint presentation. Instructor discusses the definition of a risk factor, different risk factors for pulmonary disease, and how the heart and lungs work together.     Nutrition for Pulmonary Patient:  -Group instruction provided by PowerPoint slides, verbal discussion, and written materials to support subject matter. The instructor gives an explanation and review of healthy diet recommendations, which includes a discussion on weight management, recommendations for fruit and vegetable consumption, as well as protein, fluid, caffeine, fiber, sodium, sugar, and alcohol. Tips for eating when patients are short of breath are discussed.   Pursed Lip Breathing:  -Group instruction that is supported by demonstration and informational handouts. Instructor discusses the benefits of pursed lip and diaphragmatic breathing and detailed demonstration on how to preform both.     Oxygen Safety:  -Group instruction provided by PowerPoint, verbal discussion, and written material to support subject matter. There is an overview of "What is Oxygen" and "Why do we need it".  Instructor also reviews how to  create a safe environment for oxygen use, the importance of using oxygen as prescribed, and the risks of noncompliance. There is a brief discussion on traveling with oxygen and resources the patient may utilize.   Oxygen Equipment:  -Group instruction provided by [facility] Staff utilizing handouts, written materials, and equipment demonstrations.   Signs and Symptoms:  -Group instruction provided by written material and verbal discussion to support subject matter. Warning signs and symptoms of infection, stroke, and heart attack are reviewed and when to call the physician/911 reinforced. Tips for preventing the spread of infection discussed.   Advanced Directives:  -Group instruction provided by verbal instruction and written material to support subject matter. Instructor reviews Advanced Directive laws and proper instruction for filling out document.   Pulmonary Video:  -Group video education that reviews the importance of medication and oxygen compliance, exercise, good nutrition, pulmonary hygiene, and pursed lip and diaphragmatic breathing for the pulmonary patient.   Exercise for the Pulmonary Patient:  -Group instruction that is supported by a PowerPoint presentation. Instructor discusses benefits of exercise, core components of exercise, frequency, duration, and intensity of an exercise routine, importance of utilizing pulse oximetry during exercise, safety while exercising, and options of places to exercise outside  of rehab.     Pulmonary Medications:  -Verbally interactive group education provided by instructor with focus on inhaled medications and proper administration.   Anatomy and Physiology of the Respiratory System and Intimacy:  -Group instruction provided by PowerPoint, verbal discussion, and written material to support subject matter. Instructor reviews respiratory cycle and anatomical components of the respiratory system and their functions. Instructor also reviews  differences in obstructive and restrictive respiratory diseases with examples of each. Intimacy, Sex, and Sexuality differences are reviewed with a discussion on how relationships can change when diagnosed with pulmonary disease. Common sexual concerns are reviewed.   MD DAY -A group question and answer session with a medical doctor that allows participants to ask questions that relate to their pulmonary disease state.   OTHER EDUCATION -Group or individual verbal, written, or video instructions that support the educational goals of the pulmonary rehab program.   Holiday Eating Survival Tips:  -Group instruction provided by PowerPoint slides, verbal discussion, and written materials to support subject matter. The instructor gives patients tips, tricks, and techniques to help them not only survive but enjoy the holidays despite the onslaught of food that accompanies the holidays.   Knowledge Questionnaire Score:  Knowledge Questionnaire Score - 04/10/20 1141      Knowledge Questionnaire Score   Pre Score 16/18           Core Components/Risk Factors/Patient Goals at Admission:  Personal Goals and Risk Factors at Admission - 04/10/20 1138      Core Components/Risk Factors/Patient Goals on Admission   Improve shortness of breath with ADL's Yes    Intervention Provide education, individualized exercise plan and daily activity instruction to help decrease symptoms of SOB with activities of daily living.    Expected Outcomes Short Term: Improve cardiorespiratory fitness to achieve a reduction of symptoms when performing ADLs;Long Term: Be able to perform more ADLs without symptoms or delay the onset of symptoms    Hypertension Yes    Intervention Provide education on lifestyle modifcations including regular physical activity/exercise, weight management, moderate sodium restriction and increased consumption of fresh fruit, vegetables, and low fat dairy, alcohol moderation, and smoking  cessation.    Expected Outcomes Long Term: Maintenance of blood pressure at goal levels.    Lipids Yes    Intervention Provide education and support for participant on nutrition & aerobic/resistive exercise along with prescribed medications to achieve LDL <24m, HDL >4105m           Core Components/Risk Factors/Patient Goals Review:    Core Components/Risk Factors/Patient Goals at Discharge (Final Review):    ITP Comments:   Comments:

## 2020-04-13 ENCOUNTER — Telehealth: Payer: Self-pay | Admitting: Pharmacist

## 2020-04-13 MED ORDER — CARVEDILOL 25 MG PO TABS: 25.0000 mg | ORAL_TABLET | Freq: Two times a day (BID) | ORAL | 3 refills | Status: DC

## 2020-04-13 NOTE — Telephone Encounter (Signed)
Called pt to change her from Toprol 221m daily to carvedilol 282mBID for better BP lowering as discussed at previous visit. She has used up most of her Toprol and has less than a week left. Sent in rx for carvedilol. Pt is aware to start taking carvedilol 1 tab BID with first dose 24 hours after her last dose of Toprol. She was appreciative for assistance.

## 2020-04-14 ENCOUNTER — Encounter (HOSPITAL_COMMUNITY)
Admission: RE | Admit: 2020-04-14 | Discharge: 2020-04-14 | Disposition: A | Discharge status: Home / Self Care | Payer: Medicare Other | Source: Ambulatory Visit | Attending: Internal Medicine | Admitting: Internal Medicine

## 2020-04-14 ENCOUNTER — Other Ambulatory Visit: Payer: Self-pay

## 2020-04-14 DIAGNOSIS — J84112 Idiopathic pulmonary fibrosis: Secondary | ICD-10-CM | POA: Diagnosis not present

## 2020-04-14 NOTE — Progress Notes (Signed)
Daily Session Note  Patient Details  Name: Susan Hunt MRN: [id number] Date of Birth: 1945/11/19 Referring Provider:   April Manson Pulmonary Rehab Walk Test from 04/10/2020 in [place]  Referring Provider Dr. Chase Caller      Encounter Date: 04/14/2020  Check In:  Session Check In - 04/14/20 1500      Check-In   Supervising physician immediately available to respond to emergencies [facility] immediately available    Physician(s) Dr. Antonieta Pert    Location [facility]    Staff Present Rosebud Poles, RN, Isaac Laud, MS, ACSM-CEP, Exercise Physiologist    Virtual Visit No    Medication changes reported     No    Fall or balance concerns reported    No    Tobacco Cessation No Change    Warm-up and Cool-down Performed on first and last piece of equipment    Resistance Training Performed Yes    VAD Patient? No    PAD/SET Patient? No      Pain Assessment   Currently in Pain? No/denies    Pain Score 0-No pain    Multiple Pain Sites No           Capillary Blood Glucose: No results found for this or any previous visit (from the past 24 hour(s)).    Social History   Tobacco Use  Smoking Status Former Smoker  . Packs/day: 0.50  . Years: 15.00  . Pack years: 7.50  . Types: Cigarettes  . Quit date: 08/18/2013  . Years since quitting: 6.6  Smokeless Tobacco Never Used    Goals Met:  Proper associated with RPD/PD & O2 Sat Exercise tolerated well No report of cardiac concerns or symptoms Strength training completed today  Goals Unmet:  Not Applicable  Comments: Service time is from 1330 to 1437    Dr. Fransico Him is Medical Director for Cardiac Rehab at [facility].

## 2020-04-16 ENCOUNTER — Other Ambulatory Visit: Payer: Self-pay

## 2020-04-16 ENCOUNTER — Encounter (HOSPITAL_COMMUNITY)
Admission: RE | Admit: 2020-04-16 | Discharge: 2020-04-16 | Disposition: A | Discharge status: Home / Self Care | Payer: Medicare Other | Source: Ambulatory Visit | Attending: Internal Medicine | Admitting: Internal Medicine

## 2020-04-16 DIAGNOSIS — J84112 Idiopathic pulmonary fibrosis: Secondary | ICD-10-CM | POA: Diagnosis not present

## 2020-04-16 NOTE — Progress Notes (Signed)
Daily Session Note  Patient Details  Name: Susan Hunt MRN: [id number] Date of Birth: 08-30-1945 Referring Provider:   April Manson Pulmonary Rehab Walk Test from 04/10/2020 in [place]  Referring Provider Dr. Chase Caller      Encounter Date: 04/16/2020  Check In:  Session Check In - 04/16/20 1350      Check-In   Supervising physician immediately available to respond to emergencies [facility] immediately available    Physician(s) Dr. Eliseo Squires    Location [facility]    Staff Present Rosebud Poles, RN, Isaac Laud, MS, ACSM-CEP, Exercise Physiologist    Virtual Visit No    Medication changes reported     No    Fall or balance concerns reported    No    Tobacco Cessation No Change    Warm-up and Cool-down Performed on first and last piece of equipment    Resistance Training Performed Yes    VAD Patient? No    PAD/SET Patient? No      Pain Assessment   Currently in Pain? No/denies    Multiple Pain Sites No           Capillary Blood Glucose: No results found for this or any previous visit (from the past 24 hour(s)).    Social History   Tobacco Use  Smoking Status Former Smoker  . Packs/day: 0.50  . Years: 15.00  . Pack years: 7.50  . Types: Cigarettes  . Quit date: 08/18/2013  . Years since quitting: 6.6  Smokeless Tobacco Never Used    Goals Met:  Proper associated with RPD/PD & O2 Sat Exercise tolerated well Strength training completed today  Goals Unmet:  Not Applicable  Comments: Service time is from 1335 to 1438.    Dr. Fransico Him is Medical Director for Cardiac Rehab at [facility].

## 2020-04-17 ENCOUNTER — Ambulatory Visit: Payer: Medicare Other | Admitting: Internal Medicine

## 2020-04-21 ENCOUNTER — Other Ambulatory Visit: Payer: Self-pay

## 2020-04-21 ENCOUNTER — Encounter (HOSPITAL_COMMUNITY)
Admission: RE | Admit: 2020-04-21 | Discharge: 2020-04-21 | Disposition: A | Discharge status: Home / Self Care | Payer: Medicare Other | Source: Ambulatory Visit | Attending: Internal Medicine | Admitting: Internal Medicine

## 2020-04-21 VITALS — Wt 197.5 lb

## 2020-04-21 DIAGNOSIS — J84112 Idiopathic pulmonary fibrosis: Secondary | ICD-10-CM | POA: Diagnosis not present

## 2020-04-21 NOTE — Progress Notes (Signed)
Daily Session Note  Patient Details  Name: Azhia J Nishikawa MRN: [id number] Date of Birth: 01/14/1946 Referring Provider:   Flowsheet Row Pulmonary Rehab Walk Test from 04/10/2020 in [facility]  Referring Provider Dr. Ramaswamy      Encounter Date: 04/21/2020  Check In:  Session Check In - 04/21/20 1442      Check-In   Supervising physician immediately available to respond to emergencies [facility] immediately available    Physician(s) Dr. Rai    Location [facility]    Virtual Visit No    Medication changes reported     No    Fall or balance concerns reported    No    Tobacco Cessation No Change    Warm-up and Cool-down Performed on first and last piece of equipment    Resistance Training Performed Yes    VAD Patient? No    PAD/SET Patient? No      Pain Assessment   Currently in Pain? No/denies    Multiple Pain Sites No           Capillary Blood Glucose: No results found for this or any previous visit (from the past 24 hour(s)).   Exercise Prescription Changes - 04/21/20 1400      Response to Exercise   Blood Pressure (Admit) 126/68    Blood Pressure (Exercise) 122/60    Blood Pressure (Exit) 122/56    Heart Rate (Admit) 62 bpm    Heart Rate (Exercise) 69 bpm    Heart Rate (Exit) 59 bpm    Oxygen Saturation (Admit) 95 %    Oxygen Saturation (Exercise) 92 %    Oxygen Saturation (Exit) 94 %    Rating of Perceived Exertion (Exercise) 7    Perceived Dyspnea (Exercise) 1    Duration Progress to 45 minutes of aerobic exercise without signs/symptoms of physical distress    Intensity Other (comment)   40-80% HR max     Progression   Progression Continue to progress workloads to maintain intensity without signs/symptoms of physical distress.      Resistance Training   Training Prescription Yes    Weight Blue bands    Reps 10-15    Time 10 Minutes      NuStep   Level 3    SPM 85    Minutes 15    METs 2.3            Social History   Tobacco Use  Smoking Status Former Smoker  . Packs/day: 0.50  . Years: 15.00  . Pack years: 7.50  . Types: Cigarettes  . Quit date: 08/18/2013  . Years since quitting: 6.6  Smokeless Tobacco Never Used    Goals Met:  Proper associated with RPD/PD & O2 Sat Exercise tolerated well No report of cardiac concerns or symptoms Strength training completed today  Goals Unmet:  Not Applicable  Comments: Service time is from 1330 to 1432    Dr. Traci Turner is Medical Director for Cardiac Rehab at [facility]. 

## 2020-04-21 NOTE — Progress Notes (Signed)
Susan Hunt 75 y.o. female Nutrition Note   Visit Diagnosis: IPF (idiopathic pulmonary fibrosis) (HCC)  Past Medical History:  Diagnosis Date  . Anemia   . Asthma    "I dont think I have it.  . CHF (congestive heart failure) ([place])   . Depression   . Emphysema of lung ([place])   . GERD (gastroesophageal reflux disease)   . Hemorrhoids   . History of kidney stones    Cystoscopy  . Hyperlipidemia   . Hypertension   . Hypothyroidism   . Obesity   . Pre-diabetes   . Rosacea   . Tubular adenoma of colon 03/26/2019   Colonoscopy repeat in 3 years     Medications reviewed.   Current Outpatient Medications:  .  acetaminophen (TYLENOL) 500 MG tablet, Take 2 tablets (1,000 mg total) by mouth every 6 (six) hours as needed., Disp: 30 tablet, Rfl: 0 .  amLODipine (NORVASC) 5 MG tablet, Take 1 tablet (5 mg total) by mouth daily., Disp: 90 tablet, Rfl: 3 .  carvedilol (COREG) 25 MG tablet, Take 1 tablet (25 mg total) by mouth 2 (two) times daily., Disp: 180 tablet, Rfl: 3 .  cephALEXin (KEFLEX) 250 MG capsule, Take 250 mg by mouth at bedtime.  (Patient not taking: Reported on 04/02/2020), Disp: , Rfl:  .  cetirizine (ZYRTEC) 10 MG tablet, Take 10 mg by mouth daily., Disp: , Rfl:  .  FARXIGA 10 MG TABS tablet, Take 10 mg by mouth daily., Disp: , Rfl:  .  fluticasone (FLONASE) 50 MCG/ACT nasal spray, Place 2 sprays into both nostrils at bedtime as needed (allergies.). , Disp: , Rfl:  .  levothyroxine (SYNTHROID) 125 MCG tablet, TAKE ONE TABLET BY MOUTH DAILY, Disp: 90 tablet, Rfl: 0 .  montelukast (SINGULAIR) 10 MG tablet, TAKE ONE TABLET BY MOUTH EVERY NIGHT AT BEDTIME, Disp: 90 tablet, Rfl: 1 .  Multiple Vitamin (MULTIVITAMIN WITH MINERALS) TABS tablet, Take 1 tablet by mouth daily., Disp: , Rfl:  .  omeprazole (PRILOSEC) 20 MG capsule, TAKE ONE CAPSULE BY MOUTH DAILY, Disp: 90 capsule, Rfl: 3 .  ondansetron (ZOFRAN-ODT) 4 MG disintegrating tablet, DISSOLVE ONE TABLET BY MOUTH EVERY 8  HOURS AS NEEDED FOR NAUSEA/ VOMITING, Disp: 20 tablet, Rfl: 0 .  Pirfenidone (ESBRIET) 801 MG TABS, Take 801 mg by mouth with breakfast, with lunch, and with evening meal., Disp: 90 tablet, Rfl: 3 .  rosuvastatin (CRESTOR) 10 MG tablet, Take 1 tablet (10 mg total) by mouth daily., Disp: 90 tablet, Rfl: 3 .  spironolactone (ALDACTONE) 50 MG tablet, Take 1.5 tablets (75 mg total) by mouth daily., Disp: 45 tablet, Rfl: 5 .  valsartan (DIOVAN) 320 MG tablet, Take 1 tablet (320 mg total) by mouth daily., Disp: 90 tablet, Rfl: 3 .  venlafaxine XR (EFFEXOR-XR) 150 MG 24 hr capsule, Take 1 capsule (150 mg total) by mouth daily., Disp: 90 capsule, Rfl: 3   Ht Readings from Last 1 Encounters:  04/10/20 5' 6.25" (1.683 m)     Wt Readings from Last 3 Encounters:  04/10/20 197 lb 8.5 oz (89.6 kg)  04/02/20 197 lb 6.4 oz (89.5 kg)  03/17/20 198 lb (89.8 kg)     There is no height or weight on file to calculate BMI.   Social History   Tobacco Use  Smoking Status Former Smoker  . Packs/day: 0.50  . Years: 15.00  . Pack years: 7.50  . Types: Cigarettes  . Quit date: 08/18/2013  . Years since quitting:  6.6  Smokeless Tobacco Never Used      Nutrition Note  Spoke with pt. Nutrition Plan and Nutrition Survey goals reviewed with pt.  Pt states she does not follow a healthy diet. She is motivated to make diet changes. Eats 2 meals per day. Early satiety. Snacks on sweets throughout the day/evening. Very little cooking at home. She and husband eat out 5 nights per week. She uses microwave and airfryer to make meals at home.  She wants to eat healthier and cut back on sweets.  Diet recall:  Breakfast: BLT  Dinner: Chicken chili  Snacks: 10 oreos, 8 tootsie rolls, bowl of ice cream, peanuts   Pt expressed understanding of the information reviewed.    Nutrition Diagnosis Obese  I = 30-34.9 related to excessive energy intake as evidenced by pt Diet recall   Nutrition Intervention ? Pt's  individual nutrition plan reviewed with pt. ? Self monitoring  ? Continue client-centered nutrition education by RD, as part of interdisciplinary care.  Goal(s) ? Pt to choose a healthy snack from list instead of choosing sweet snacks. ? Pt to identify food quantities necessary to achieve weight loss of 6-24 lb at graduation from cardiac rehab.   Plan:   Will provide client-centered nutrition education as part of interdisciplinary care  Monitor and evaluate progress toward nutrition goal with team.   Michaele Offer, MS, RDN, LDN

## 2020-04-21 NOTE — Progress Notes (Signed)
Pulmonary Individual Treatment Plan  Patient Details  Name: Susan Hunt MRN: [id number] Date of Birth: 1946/01/02 Referring Provider:   April Manson Pulmonary Rehab Walk Test from 04/10/2020 in [place]  Referring Provider Dr. Chase Caller      Initial Encounter Date:  Flowsheet Row Pulmonary Rehab Walk Test from 04/10/2020 in [place]  Date 04/10/20      Visit Diagnosis: IPF (idiopathic pulmonary fibrosis) ([place])  Patient's Home Medications on Admission:   Current Outpatient Medications:  .  acetaminophen (TYLENOL) 500 MG tablet, Take 2 tablets (1,000 mg total) by mouth every 6 (six) hours as needed., Disp: 30 tablet, Rfl: 0 .  amLODipine (NORVASC) 5 MG tablet, Take 1 tablet (5 mg total) by mouth daily., Disp: 90 tablet, Rfl: 3 .  carvedilol (COREG) 25 MG tablet, Take 1 tablet (25 mg total) by mouth 2 (two) times daily., Disp: 180 tablet, Rfl: 3 .  cephALEXin (KEFLEX) 250 MG capsule, Take 250 mg by mouth at bedtime.  (Patient not taking: Reported on 04/02/2020), Disp: , Rfl:  .  cetirizine (ZYRTEC) 10 MG tablet, Take 10 mg by mouth daily., Disp: , Rfl:  .  FARXIGA 10 MG TABS tablet, Take 10 mg by mouth daily., Disp: , Rfl:  .  fluticasone (FLONASE) 50 MCG/ACT nasal spray, Place 2 sprays into both nostrils at bedtime as needed (allergies.). , Disp: , Rfl:  .  levothyroxine (SYNTHROID) 125 MCG tablet, TAKE ONE TABLET BY MOUTH DAILY, Disp: 90 tablet, Rfl: 0 .  montelukast (SINGULAIR) 10 MG tablet, TAKE ONE TABLET BY MOUTH EVERY NIGHT AT BEDTIME, Disp: 90 tablet, Rfl: 1 .  Multiple Vitamin (MULTIVITAMIN WITH MINERALS) TABS tablet, Take 1 tablet by mouth daily., Disp: , Rfl:  .  omeprazole (PRILOSEC) 20 MG capsule, TAKE ONE CAPSULE BY MOUTH DAILY, Disp: 90 capsule, Rfl: 3 .  ondansetron (ZOFRAN-ODT) 4 MG disintegrating tablet, DISSOLVE ONE TABLET BY MOUTH EVERY 8 HOURS AS NEEDED FOR NAUSEA/ VOMITING, Disp: 20 tablet, Rfl:  0 .  Pirfenidone (ESBRIET) 801 MG TABS, Take 801 mg by mouth with breakfast, with lunch, and with evening meal., Disp: 90 tablet, Rfl: 3 .  rosuvastatin (CRESTOR) 10 MG tablet, Take 1 tablet (10 mg total) by mouth daily., Disp: 90 tablet, Rfl: 3 .  spironolactone (ALDACTONE) 50 MG tablet, Take 1.5 tablets (75 mg total) by mouth daily., Disp: 45 tablet, Rfl: 5 .  valsartan (DIOVAN) 320 MG tablet, Take 1 tablet (320 mg total) by mouth daily., Disp: 90 tablet, Rfl: 3 .  venlafaxine XR (EFFEXOR-XR) 150 MG 24 hr capsule, Take 1 capsule (150 mg total) by mouth daily., Disp: 90 capsule, Rfl: 3  Past Medical History: Past Medical History:  Diagnosis Date  . Anemia   . Asthma    "I dont think I have it.  . CHF (congestive heart failure) ([place])   . Depression   . Emphysema of lung ([place])   . GERD (gastroesophageal reflux disease)   . Hemorrhoids   . History of kidney stones    Cystoscopy  . Hyperlipidemia   . Hypertension   . Hypothyroidism   . Obesity   . Pre-diabetes   . Rosacea   . Tubular adenoma of colon 03/26/2019   Colonoscopy repeat in 3 years    Tobacco Use: Social History   Tobacco Use  Smoking Status Former Smoker  . Packs/day: 0.50  . Years: 15.00  . Pack years: 7.50  . Types: Cigarettes  .  Quit date: 08/18/2013  . Years since quitting: 6.6  Smokeless Tobacco Never Used    Labs: Recent Review Flowsheet Data    Labs for ITP Cardiac and Pulmonary Rehab Latest Ref Rng & Units 10/15/2019 11/01/2019 11/01/2019 12/09/2019 12/11/2019   Cholestrol <200 mg/dL - - - 155 -   LDLCALC mg/dL (calc) - - - 73 -   LDLDIRECT mg/dL - - - - -   HDL > OR = 50 mg/dL - - - 65 -   Trlycerides <150 mg/dL - - - 87 -   Hemoglobin A1c 4.0 - 5.6 % 6.8(H) - - 6.1(A) -   PHART 7.350 - 7.450 7.403 7.416 - - 7.418   PCO2ART 32.0 - 48.0 mmHg 36.1 42.6 - - 37.7   HCO3 20.0 - 28.0 mmol/L 22.1 27.4 26.8 - 23.8   TCO2 22 - 32 mmol/L - 29 28 - -   ACIDBASEDEF 0.0 - 2.0 mmol/L 2.0 - - - 0.1   O2SAT % 98.0  100.0 79.0 - 96.7      Capillary Blood Glucose: Lab Results  Component Value Date   GLUCAP 100 (H) 12/16/2019   GLUCAP 146 (H) 12/16/2019   GLUCAP 104 (H) 12/15/2019   GLUCAP 126 (H) 12/15/2019   GLUCAP 118 (H) 12/15/2019     Pulmonary Assessment Scores:  Pulmonary Assessment Scores    Row Name 04/10/20 1142         ADL UCSD   ADL Phase Entry     SOB Score total 47           CAT Score   CAT Score 17           mMRC Score   mMRC Score 2           UCSD: Self-administered rating of dyspnea associated with activities of daily living (ADLs) 6-point scale (0 = "not at all" to 5 = "maximal or unable to do because of breathlessness")  Scoring Scores range from 0 to 120.  Minimally important difference is 5 units  CAT: CAT can identify the health impairment of COPD patients and is better correlated with disease progression.  CAT has a scoring range of zero to 40. The CAT score is classified into four groups of low (less than 10), medium (10 - 20), high (21-30) and very high (31-40) based on the impact level of disease on health status. A CAT score over 10 suggests significant symptoms.  A worsening CAT score could be explained by an exacerbation, poor medication adherence, poor inhaler technique, or progression of COPD or comorbid conditions.  CAT MCID is 2 points  mMRC: mMRC (Modified Medical Research Council) Dyspnea Scale is used to assess the degree of baseline functional disability in patients of respiratory disease due to dyspnea. No minimal important difference is established. A decrease in score of 1 point or greater is considered a positive change.   Pulmonary Function Assessment:  Pulmonary Function Assessment - 04/10/20 1103      Breath   Shortness of Breath Yes;Limiting activity           Exercise Target Goals: Exercise Program Goal: Individual exercise prescription set using results from initial 6 min walk test and THRR while considering  patient's  activity barriers and safety.   Exercise Prescription Goal: Initial exercise prescription builds to 30-45 minutes a day of aerobic activity, 2-3 days per week.  Home exercise guidelines will be given to patient during program as part of exercise prescription that the participant  will acknowledge.  Activity Barriers & Risk Stratification:  Activity Barriers & Cardiac Risk Stratification - 04/10/20 1101      Activity Barriers & Cardiac Risk Stratification   Activity Barriers Deconditioning;Shortness of Breath    Cardiac Risk Stratification Moderate           6 Minute Walk:  6 Minute Walk    Row Name 04/10/20 1151         6 Minute Walk   Phase Initial     Distance 1209 feet     Walk Time 6 minutes     # of Rest Breaks 0     MPH 2.29     METS 2.11     RPE 11     Perceived Dyspnea  2     VO2 Peak 7.39     Symptoms No     Resting HR 59 bpm     Resting BP 126/60     Resting Oxygen Saturation  95 %     Exercise Oxygen Saturation  during 6 min walk 90 %     Max Ex. HR 82 bpm     Max Ex. BP 142/66     2 Minute Post BP 140/66           Interval HR   1 Minute HR 74     2 Minute HR 72     3 Minute HR 76     4 Minute HR 82     5 Minute HR 77     6 Minute HR 79     2 Minute Post HR 63     Interval Heart Rate? Yes           Interval Oxygen   Interval Oxygen? Yes     Baseline Oxygen Saturation % 95 %     1 Minute Oxygen Saturation % 96 %     1 Minute Liters of Oxygen 0 L     2 Minute Oxygen Saturation % 92 %     2 Minute Liters of Oxygen 0 L     3 Minute Oxygen Saturation % 91 %     3 Minute Liters of Oxygen 0 L     4 Minute Oxygen Saturation % 91 %     4 Minute Liters of Oxygen 0 L     5 Minute Oxygen Saturation % 90 %     5 Minute Liters of Oxygen 0 L     6 Minute Oxygen Saturation % 91 %     6 Minute Liters of Oxygen 0 L     2 Minute Post Oxygen Saturation % 95 %     2 Minute Post Liters of Oxygen 0 L            Oxygen Initial Assessment:  Oxygen Initial  Assessment - 04/10/20 1137      Home Oxygen   Home Oxygen Device None    Sleep Oxygen Prescription None    Home Exercise Oxygen Prescription None    Home Resting Oxygen Prescription None      Initial 6 min Walk   Oxygen Used None      Program Oxygen Prescription   Program Oxygen Prescription None      Intervention   Short Term Goals To learn and understand importance of monitoring SPO2 with pulse oximeter and demonstrate accurate use of the pulse oximeter.;To learn and understand importance of maintaining oxygen saturations>88%;To learn and demonstrate proper pursed lip breathing techniques  or other breathing techniques.;To learn and demonstrate proper use of respiratory medications    Long  Term Goals Verbalizes importance of monitoring SPO2 with pulse oximeter and return demonstration;Maintenance of O2 saturations>88%;Exhibits proper breathing techniques, such as pursed lip breathing or other method taught during program session;Compliance with respiratory medication;Demonstrates proper use of MDI's           Oxygen Re-Evaluation:  Oxygen Re-Evaluation    Row Name 04/21/20 0826             Program Oxygen Prescription   Program Oxygen Prescription None               Home Oxygen   Home Oxygen Device None       Sleep Oxygen Prescription None       Home Exercise Oxygen Prescription None       Home Resting Oxygen Prescription None               Goals/Expected Outcomes   Short Term Goals To learn and understand importance of monitoring SPO2 with pulse oximeter and demonstrate accurate use of the pulse oximeter.;To learn and understand importance of maintaining oxygen saturations>88%;To learn and demonstrate proper pursed lip breathing techniques or other breathing techniques.;To learn and demonstrate proper use of respiratory medications       Long  Term Goals Verbalizes importance of monitoring SPO2 with pulse oximeter and return demonstration;Maintenance of O2  saturations>88%;Exhibits proper breathing techniques, such as pursed lip breathing or other method taught during program session;Compliance with respiratory medication;Demonstrates proper use of MDI's       Goals/Expected Outcomes Understanding of oxygen saturation and pursed lip breathing              Oxygen Discharge (Final Oxygen Re-Evaluation):  Oxygen Re-Evaluation - 04/21/20 0826      Program Oxygen Prescription   Program Oxygen Prescription None      Home Oxygen   Home Oxygen Device None    Sleep Oxygen Prescription None    Home Exercise Oxygen Prescription None    Home Resting Oxygen Prescription None      Goals/Expected Outcomes   Short Term Goals To learn and understand importance of monitoring SPO2 with pulse oximeter and demonstrate accurate use of the pulse oximeter.;To learn and understand importance of maintaining oxygen saturations>88%;To learn and demonstrate proper pursed lip breathing techniques or other breathing techniques.;To learn and demonstrate proper use of respiratory medications    Long  Term Goals Verbalizes importance of monitoring SPO2 with pulse oximeter and return demonstration;Maintenance of O2 saturations>88%;Exhibits proper breathing techniques, such as pursed lip breathing or other method taught during program session;Compliance with respiratory medication;Demonstrates proper use of MDI's    Goals/Expected Outcomes Understanding of oxygen saturation and pursed lip breathing           Initial Exercise Prescription:  Initial Exercise Prescription - 04/14/20 1500      Recumbant Bike   Level 1.5    RPM 60    Minutes 15    METs 1.8      NuStep   Level 2    SPM 80    Minutes 15    METs 2           Perform Capillary Blood Glucose checks as needed.  Exercise Prescription Changes:   Exercise Comments:  Exercise Comments    Row Name 04/14/20 1508           Exercise Comments Patient completed first day of exercise and tolerated  well  with no complaints. She was able to do 15 minutes on the Nustep and 15 minutes on the bike. Pt also completed resistance training with no concerns.              Exercise Goals and Review:  Exercise Goals    Row Name 04/10/20 1137             Exercise Goals   Increase Physical Activity Yes       Intervention Provide advice, education, support and counseling about physical activity/exercise needs.;Develop an individualized exercise prescription for aerobic and resistive training based on initial evaluation findings, risk stratification, comorbidities and participant's personal goals.       Expected Outcomes Short Term: Attend rehab on a regular basis to increase amount of physical activity.;Long Term: Add in home exercise to make exercise part of routine and to increase amount of physical activity.;Long Term: Exercising regularly at least 3-5 days a week.       Increase Strength and Stamina Yes       Intervention Provide advice, education, support and counseling about physical activity/exercise needs.;Develop an individualized exercise prescription for aerobic and resistive training based on initial evaluation findings, risk stratification, comorbidities and participant's personal goals.       Expected Outcomes Short Term: Increase workloads from initial exercise prescription for resistance, speed, and METs.;Short Term: Perform resistance training exercises routinely during rehab and add in resistance training at home;Long Term: Improve cardiorespiratory fitness, muscular endurance and strength as measured by increased METs and functional capacity (6MWT)       Able to understand and use rate of perceived exertion (RPE) scale Yes       Intervention Provide education and explanation on how to use RPE scale       Expected Outcomes Short Term: Able to use RPE daily in rehab to express subjective intensity level;Long Term:  Able to use RPE to guide intensity level when exercising independently        Able to understand and use Dyspnea scale Yes       Intervention Provide education and explanation on how to use Dyspnea scale       Expected Outcomes Short Term: Able to use Dyspnea scale daily in rehab to express subjective sense of shortness of breath during exertion;Long Term: Able to use Dyspnea scale to guide intensity level when exercising independently       Knowledge and understanding of Target Heart Rate Range (THRR) Yes       Intervention Provide education and explanation of THRR including how the numbers were predicted and where they are located for reference       Expected Outcomes Short Term: Able to state/look up THRR;Long Term: Able to use THRR to govern intensity when exercising independently;Short Term: Able to use daily as guideline for intensity in rehab       Understanding of Exercise Prescription Yes       Intervention Provide education, explanation, and written materials on patient's individual exercise prescription       Expected Outcomes Short Term: Able to explain program exercise prescription;Long Term: Able to explain home exercise prescription to exercise independently              Exercise Goals Re-Evaluation :  Exercise Goals Re-Evaluation    Row Name 04/21/20 0827             Exercise Goal Re-Evaluation   Exercise Goals Review Increase Physical Activity;Increase Strength and Stamina;Able to understand and use rate of  perceived exertion (RPE) scale;Able to understand and use Dyspnea scale;Knowledge and understanding of Target Heart Rate Range (THRR);Understanding of Exercise Prescription       Comments Susan Hunt has completed 2 exercise sessions and has tolerated well so far with no complaints or concerns. It is too early to see any progression. Will continue to monitor and progress as she is able. She is currently exercising at 2.2 METS on the Nustep and 2.1 METS on the bike.       Expected Outcomes Through exercise at rehab and home the patient will decrease  shortness of breath with daily activities and feel confident in carrying out an exercise regimn at home.              Discharge Exercise Prescription (Final Exercise Prescription Changes):   Nutrition:  Target Goals: Understanding of nutrition guidelines, daily intake of sodium <1534m, cholesterol <207m calories 30% from fat and 7% or less from saturated fats, daily to have 5 or more servings of fruits and vegetables.  Biometrics:  Pre Biometrics - 04/10/20 1137      Pre Biometrics   Grip Strength 26 kg            Nutrition Therapy Plan and Nutrition Goals:  Nutrition Therapy & Goals - 04/20/20 0850      Nutrition Therapy   RD appointment deferred Yes           Nutrition Assessments:  MEDIFICTS Score Key:  ?70 Need to make dietary changes   40-70 Heart Healthy Diet  ? 40 Therapeutic Level Cholesterol Diet   Picture Your Plate Scores:  <4<83nhealthy dietary pattern with much room for improvement.  41-50 Dietary pattern unlikely to meet recommendations for good health and room for improvement.  51-60 More healthful dietary pattern, with some room for improvement.   >60 Healthy dietary pattern, although there may be some specific behaviors that could be improved.    Nutrition Goals Re-Evaluation:   Nutrition Goals Discharge (Final Nutrition Goals Re-Evaluation):   Psychosocial: Target Goals: Acknowledge presence or absence of significant depression and/or stress, maximize coping skills, provide positive support system. Participant is able to verbalize types and ability to use techniques and skills needed for reducing stress and depression.  Initial Review & Psychosocial Screening:  Initial Psych Review & Screening - 04/10/20 1138      Screening Interventions   Expected Outcomes Long Term goal: The participant improves quality of Life and PHQ9 Scores as seen by post scores and/or verbalization of changes           Quality of Life  Scores:  Scores of 19 and below usually indicate a poorer quality of life in these areas.  A difference of  2-3 points is a clinically meaningful difference.  A difference of 2-3 points in the total score of the Quality of Life Index has been associated with significant improvement in overall quality of life, self-image, physical symptoms, and general health in studies assessing change in quality of life.  PHQ-9: Recent Review Flowsheet Data    [facility] 04/10/2020 03/26/2019 10/02/2017 02/14/2017   Decreased Interest 0 0 0 0   Down, Depressed, Hopeless 0 0 0 0   PHQ - 2 Score 0 0 0 0   Altered sleeping 0 0 0 -   Tired, decreased energy 0 3 0 -   Change in appetite 0 3 0 -   Feeling bad or failure about yourself  0 0 0 -   Trouble  concentrating 0 0 0 -   Moving slowly or fidgety/restless 0 1 0 -   Suicidal thoughts 0 0 0 -   PHQ-9 Score 0 7 0 -   Difficult doing work/chores Not difficult at all Not difficult at all Not difficult at all -     Interpretation of Total Score  Total Score Depression Severity:  1-4 = Minimal depression, 5-9 = Mild depression, 10-14 = Moderate depression, 15-19 = Moderately severe depression, 20-27 = Severe depression   Psychosocial Evaluation and Intervention:   Psychosocial Re-Evaluation:  Psychosocial Re-Evaluation    Center Name 04/20/20 1349             Psychosocial Re-Evaluation   Current issues with None Identified       Comments No psychosocial concerns identified at this time.       Expected Outcomes For Susan Hunt to continue to have no psychosocial concerns while participating in pulmonary rehab.       Interventions Encouraged to attend Pulmonary Rehabilitation for the exercise       Continue Psychosocial Services  No Follow up required              Psychosocial Discharge (Final Psychosocial Re-Evaluation):  Psychosocial Re-Evaluation - 04/20/20 1349      Psychosocial Re-Evaluation   Current issues with None Identified     Comments No psychosocial concerns identified at this time.    Expected Outcomes For Susan Hunt to continue to have no psychosocial concerns while participating in pulmonary rehab.    Interventions Encouraged to attend Pulmonary Rehabilitation for the exercise    Continue Psychosocial Services  No Follow up required           Education: Education Goals: Education classes will be provided on a weekly basis, covering required topics. Participant will state understanding/return demonstration of topics presented.  Learning Barriers/Preferences:  Learning Barriers/Preferences - 04/10/20 1104      Learning Barriers/Preferences   Learning Barriers Hearing   Has hearing aides and does not use them   Learning Preferences Verbal Instruction;Audio;Video           Education Topics: Risk Factor Reduction:  -Group instruction that is supported by a PowerPoint presentation. Instructor discusses the definition of a risk factor, different risk factors for pulmonary disease, and how the heart and lungs work together.     Nutrition for Pulmonary Patient:  -Group instruction provided by PowerPoint slides, verbal discussion, and written materials to support subject matter. The instructor gives an explanation and review of healthy diet recommendations, which includes a discussion on weight management, recommendations for fruit and vegetable consumption, as well as protein, fluid, caffeine, fiber, sodium, sugar, and alcohol. Tips for eating when patients are short of breath are discussed.   Pursed Lip Breathing:  -Group instruction that is supported by demonstration and informational handouts. Instructor discusses the benefits of pursed lip and diaphragmatic breathing and detailed demonstration on how to preform both.     Oxygen Safety:  -Group instruction provided by PowerPoint, verbal discussion, and written material to support subject matter. There is an overview of "What is Oxygen" and "Why do we need it".   Instructor also reviews how to create a safe environment for oxygen use, the importance of using oxygen as prescribed, and the risks of noncompliance. There is a brief discussion on traveling with oxygen and resources the patient may utilize.   Oxygen Equipment:  -Group instruction provided by [facility] utilizing handouts, written materials, and equipment demonstrations.  Signs and Symptoms:  -Group instruction provided by written material and verbal discussion to support subject matter. Warning signs and symptoms of infection, stroke, and heart attack are reviewed and when to call the physician/911 reinforced. Tips for preventing the spread of infection discussed.   Advanced Directives:  -Group instruction provided by verbal instruction and written material to support subject matter. Instructor reviews Advanced Directive laws and proper instruction for filling out document.   Pulmonary Video:  -Group video education that reviews the importance of medication and oxygen compliance, exercise, good nutrition, pulmonary hygiene, and pursed lip and diaphragmatic breathing for the pulmonary patient.   Exercise for the Pulmonary Patient:  -Group instruction that is supported by a PowerPoint presentation. Instructor discusses benefits of exercise, core components of exercise, frequency, duration, and intensity of an exercise routine, importance of utilizing pulse oximetry during exercise, safety while exercising, and options of places to exercise outside of rehab.   Flowsheet Row PULMONARY REHAB OTHER RESPIRATORY from 04/16/2020 in [place]  Date 04/16/20  Educator handout      Pulmonary Medications:  -Verbally interactive group education provided by instructor with focus on inhaled medications and proper administration.   Anatomy and Physiology of the Respiratory System and Intimacy:  -Group instruction provided by PowerPoint, verbal discussion, and  written material to support subject matter. Instructor reviews respiratory cycle and anatomical components of the respiratory system and their functions. Instructor also reviews differences in obstructive and restrictive respiratory diseases with examples of each. Intimacy, Sex, and Sexuality differences are reviewed with a discussion on how relationships can change when diagnosed with pulmonary disease. Common sexual concerns are reviewed.   MD DAY -A group question and answer session with a medical doctor that allows participants to ask questions that relate to their pulmonary disease state.   OTHER EDUCATION -Group or individual verbal, written, or video instructions that support the educational goals of the pulmonary rehab program.   Holiday Eating Survival Tips:  -Group instruction provided by PowerPoint slides, verbal discussion, and written materials to support subject matter. The instructor gives patients tips, tricks, and techniques to help them not only survive but enjoy the holidays despite the onslaught of food that accompanies the holidays.   Knowledge Questionnaire Score:  Knowledge Questionnaire Score - 04/10/20 1141      Knowledge Questionnaire Score   Pre Score 16/18           Core Components/Risk Factors/Patient Goals at Admission:  Personal Goals and Risk Factors at Admission - 04/20/20 1351      Core Components/Risk Factors/Patient Goals on Admission   Improve shortness of breath with ADL's Yes    Intervention Provide education, individualized exercise plan and daily activity instruction to help decrease symptoms of SOB with activities of daily living.    Expected Outcomes Short Term: Improve cardiorespiratory fitness to achieve a reduction of symptoms when performing ADLs;Long Term: Be able to perform more ADLs without symptoms or delay the onset of symptoms           Core Components/Risk Factors/Patient Goals Review:   Goals and Risk Factor Review    Row  Name 04/20/20 1352             Core Components/Risk Factors/Patient Goals Review   Personal Goals Review Develop more efficient breathing techniques such as purse lipped breathing and diaphragmatic breathing and practicing self-pacing with activity.;Increase knowledge of respiratory medications and ability to use respiratory devices properly.;Improve shortness of breath with ADL's  Review Susan Hunt has attended 2 exercise sessions and is tolerating it well, should see dramatic improvement in the next 30 days.       Expected Outcomes See admission goals.              Core Components/Risk Factors/Patient Goals at Discharge (Final Review):   Goals and Risk Factor Review - 04/20/20 1352      Core Components/Risk Factors/Patient Goals Review   Personal Goals Review Develop more efficient breathing techniques such as purse lipped breathing and diaphragmatic breathing and practicing self-pacing with activity.;Increase knowledge of respiratory medications and ability to use respiratory devices properly.;Improve shortness of breath with ADL's    Review Susan Hunt has attended 2 exercise sessions and is tolerating it well, should see dramatic improvement in the next 30 days.    Expected Outcomes See admission goals.           ITP Comments:   Comments: ITP REVIEW Pt is making expected progress toward pulmonary rehab goals after completing 2 sessions. Recommend continued exercise, life style modification, education, and utilization of breathing techniques to increase stamina and strength and decrease shortness of breath with exertion.

## 2020-04-23 ENCOUNTER — Encounter (HOSPITAL_COMMUNITY): Payer: Medicare Other

## 2020-04-26 ENCOUNTER — Other Ambulatory Visit: Payer: Self-pay | Admitting: Family Medicine

## 2020-04-27 ENCOUNTER — Telehealth (HOSPITAL_COMMUNITY): Payer: Self-pay | Admitting: Family Medicine

## 2020-04-28 ENCOUNTER — Encounter (HOSPITAL_COMMUNITY): Payer: Medicare Other

## 2020-04-30 ENCOUNTER — Encounter (HOSPITAL_COMMUNITY): Payer: Medicare Other

## 2020-05-05 ENCOUNTER — Encounter (HOSPITAL_COMMUNITY): Payer: Medicare Other

## 2020-05-07 ENCOUNTER — Encounter (HOSPITAL_COMMUNITY): Payer: Medicare Other

## 2020-05-07 NOTE — Progress Notes (Signed)
LFT norma 1 mo ago. Will not call with normal result

## 2020-05-11 ENCOUNTER — Telehealth (HOSPITAL_COMMUNITY): Payer: Self-pay | Admitting: Family Medicine

## 2020-05-12 ENCOUNTER — Encounter (HOSPITAL_COMMUNITY): Payer: Medicare Other

## 2020-05-14 ENCOUNTER — Encounter (HOSPITAL_COMMUNITY): Payer: Medicare Other

## 2020-05-18 ENCOUNTER — Encounter: Payer: Self-pay | Admitting: Family Medicine

## 2020-05-18 ENCOUNTER — Ambulatory Visit: Payer: Medicare Other | Admitting: Family Medicine

## 2020-05-18 ENCOUNTER — Ambulatory Visit (INDEPENDENT_AMBULATORY_CARE_PROVIDER_SITE_OTHER): Payer: Medicare Other | Admitting: Family Medicine

## 2020-05-18 ENCOUNTER — Other Ambulatory Visit: Payer: Self-pay

## 2020-05-18 ENCOUNTER — Ambulatory Visit (INDEPENDENT_AMBULATORY_CARE_PROVIDER_SITE_OTHER): Payer: Medicare Other

## 2020-05-18 VITALS — BP 128/70 | HR 75 | Temp 98.0°F | Ht 66.25 in | Wt 201.0 lb

## 2020-05-18 DIAGNOSIS — J849 Interstitial pulmonary disease, unspecified: Secondary | ICD-10-CM

## 2020-05-18 DIAGNOSIS — R0789 Other chest pain: Secondary | ICD-10-CM | POA: Diagnosis not present

## 2020-05-18 DIAGNOSIS — R0781 Pleurodynia: Secondary | ICD-10-CM

## 2020-05-18 DIAGNOSIS — R0602 Shortness of breath: Secondary | ICD-10-CM | POA: Diagnosis not present

## 2020-05-18 MED ORDER — METHOCARBAMOL 500 MG PO TABS: 500.0000 mg | ORAL_TABLET | Freq: Two times a day (BID) | ORAL | 0 refills | Status: DC | PRN

## 2020-05-18 NOTE — Progress Notes (Signed)
Subjective  CC:  Chief Complaint  Patient presents with   Acute Visit    Injury to ribs after fall    HPI: Susan Hunt is a 75 y.o. female who presents to the office today to address the problems listed above in the chief complaint.  Susan Hunt was leaning forward on a slight incline last week while weeding.  She lost her balance and fell forward.  She is not exactly sure how she fell.  She does remember landing on her left cheek.  Later that night she noted right sided chest wall pain.  It has progressed in nature.  Now painful to take deep breaths.  Some shortness of breath with exertion.  Pain has interfered with sleep.  Anti-inflammatories have not been helpful.  She did take a Flexeril and that helped her relax.  Some pleuritic chest pain.  She is splinting slightly.  No abdominal pain.  No nausea, vomiting, hemoptysis, hematemesis, or change in bowel habits.  No fevers or chills.  No productive cough.  She has chronic idiopathic interstitial lung disease and heart failure.  These have been stabilizing.  She hurts in the area where she had a chest tube after her lung biopsy.  She wonders if there is a complication.   Assessment  1. Right-sided chest wall pain   2. Pleuritic chest pain   3. SOB (shortness of breath)   4. Interstitial lung disease ([place])      Plan   Right-sided chest wall pain after a fall: Check chest x-ray and rib detail.  Patient wants to avoid narcotics.  She has tramadol at home.  May use Robaxin or Flexeril if needed.  Recommend deep breathing exercises.  Continue pulmonary and heart failure medications.  Currently no signs of uncompensated heart failure or lung infection.  Will check chest x-ray to ensure.  She will follow-up with things are worsening.  Follow up: 4 weeks to recheck diabetes and blood pressure Visit date not found  Orders Placed This Encounter  Procedures   DG Ribs Unilateral W/Chest Right   Meds ordered this encounter  Medications    methocarbamol (ROBAXIN) 500 MG tablet    Sig: Take 1 tablet (500 mg total) by mouth 2 (two) times daily as needed for muscle spasms.    Dispense:  30 tablet    Refill:  0      I reviewed the patients updated PMH, FH, and SocHx.    Patient Active Problem List   Diagnosis Date Noted   Interstitial lung disease ([place]) 11/01/2019    Priority: High   HFrEF (heart failure with reduced ejection fraction) ([place]) 11/01/2019    Priority: High   Controlled type 2 diabetes mellitus without complication, without long-term current use of insulin ([place]) 07/19/2019    Priority: High   Tubular adenoma of colon 03/26/2019    Priority: High   Panlobular emphysema ([place]) 06/17/2014    Priority: High   Major depression, chronic 01/28/2014    Priority: High   Obesity (BMI 30.0-34.9) 07/15/2013    Priority: High   Acquired hypothyroidism 04/11/2007    Priority: High   Mixed hyperlipidemia 04/11/2007    Priority: High   Essential hypertension 04/11/2007    Priority: High   Recurrent UTI 04/12/2019    Priority: Medium   Hepatic steatosis 03/26/2019    Priority: Medium   Osteopenia of femoral neck 05/24/2016    Priority: Medium   RENAL CALCULUS, HX OF 04/11/2007    Priority:  Medium   ACE-inhibitor cough 10/02/2017    Priority: Low   Postmenopausal symptoms 02/14/2017    Priority: Low   Grade II hemorrhoids 05/03/2016    Priority: Low   Snoring 05/06/2014    Priority: Low   Allergic rhinitis 12/18/2009    Priority: Low   Rosacea 04/11/2007    Priority: Low   URINARY INCONTINENCE 04/11/2007    Priority: Low   S/P robot-assisted surgical procedure 12/13/2019   Chronic vertigo 07/19/2019   Hyperglycemia 04/10/2019   Current Meds  Medication Sig   acetaminophen (TYLENOL) 500 MG tablet Take 2 tablets (1,000 mg total) by mouth every 6 (six) hours as needed.   amLODipine (NORVASC) 5 MG tablet Take 1 tablet (5 mg total) by mouth daily.   carvedilol (COREG) 25 MG  tablet Take 1 tablet (25 mg total) by mouth 2 (two) times daily.   cephALEXin (KEFLEX) 250 MG capsule Take 250 mg by mouth at bedtime.   cetirizine (ZYRTEC) 10 MG tablet Take 10 mg by mouth daily.   FARXIGA 10 MG TABS tablet Take 10 mg by mouth daily.   fluticasone (FLONASE) 50 MCG/ACT nasal spray Place 2 sprays into both nostrils at bedtime as needed (allergies.).    levothyroxine (SYNTHROID) 125 MCG tablet TAKE ONE TABLET BY MOUTH DAILY   methocarbamol (ROBAXIN) 500 MG tablet Take 1 tablet (500 mg total) by mouth 2 (two) times daily as needed for muscle spasms.   montelukast (SINGULAIR) 10 MG tablet TAKE ONE TABLET BY MOUTH EVERY NIGHT AT BEDTIME   Multiple Vitamin (MULTIVITAMIN WITH MINERALS) TABS tablet Take 1 tablet by mouth daily.   omeprazole (PRILOSEC) 20 MG capsule TAKE ONE CAPSULE BY MOUTH DAILY   ondansetron (ZOFRAN-ODT) 4 MG disintegrating tablet DISSOLVE ONE TABLET BY MOUTH EVERY 8 HOURS AS NEEDED FOR NAUSEA/ VOMITING   Pirfenidone (ESBRIET) 801 MG TABS Take 801 mg by mouth with breakfast, with lunch, and with evening meal.   rosuvastatin (CRESTOR) 10 MG tablet TAKE ONE TABLET BY MOUTH DAILY   spironolactone (ALDACTONE) 50 MG tablet Take 1.5 tablets (75 mg total) by mouth daily.   valsartan (DIOVAN) 320 MG tablet Take 1 tablet (320 mg total) by mouth daily.   venlafaxine XR (EFFEXOR-XR) 150 MG 24 hr capsule Take 1 capsule (150 mg total) by mouth daily.    Allergies: Patient is allergic to ace inhibitors, clarithromycin, codeine, and telmisartan. Family History: Patient family history includes Alcohol abuse in her father; Diabetes in her maternal grandmother; Drug abuse in her son; Heart disease in her mother; Hypertension in her mother; Osteoporosis in her mother; Stroke in her mother. Social History:  Patient  reports that she quit smoking about 6 years ago. Her smoking use included cigarettes. She has a 7.50 pack-year smoking history. She has never used smokeless  tobacco. She reports previous alcohol use of about 1.0 standard drink of alcohol per week. She reports that she does not use drugs.  Review of Systems: Constitutional: Negative for fever malaise or anorexia Cardiovascular: negative for chest pain Respiratory: negative for SOB or persistent cough Gastrointestinal: negative for abdominal pain  Objective  Vitals: BP 128/70 (BP Location: Left Arm, Patient Position: Sitting, Cuff Size: Normal)    Pulse 75    Temp 98 F (36.7 C) (Temporal)    Ht 5' 6.25" (1.683 m)    Wt 201 lb (91.2 kg)    SpO2 95%    BMI 32.20 kg/m  General: no acute distress , A&Ox3, breathing fairly normally HEENT: PEERL,  conjunctiva normal, neck is supple Cardiovascular:  RRR without murmur or gallop.  Respiratory:  Good breath sounds bilaterally, CTAB with normal respiratory effort Right lower anterior ribs are tender without crepitus or ecchymosis Gastrointestinal: soft, flat abdomen, normal active bowel sounds, no palpable masses, no hepatosplenomegaly, no appreciated hernias Skin:  Warm, no rashes     Commons side effects, risks, benefits, and alternatives for medications and treatment plan prescribed today were discussed, and the patient expressed understanding of the given instructions. Patient is instructed to call or message via MyChart if he/she has any questions or concerns regarding our treatment plan. No barriers to understanding were identified. We discussed Red Flag symptoms and signs in detail. Patient expressed understanding regarding what to do in case of urgent or emergency type symptoms.   Medication list was reconciled, printed and provided to the patient in AVS. Patient instructions and summary information was reviewed with the patient as documented in the AVS. This note was prepared with assistance of Dragon voice recognition software. Occasional wrong-word or sound-a-like substitutions may have occurred due to the inherent limitations of voice  recognition software  This visit occurred during the SARS-CoV-2 public health emergency.  Safety protocols were in place, including screening questions prior to the visit, additional usage of staff PPE, and extensive cleaning of exam room while observing appropriate contact time as indicated for disinfecting solutions.

## 2020-05-18 NOTE — Patient Instructions (Signed)
Please return in 4 weeks for f/u diabetes.  If you have any questions or concerns, please don't hesitate to send me a message via MyChart or call the office at [phone]. Thank you for visiting with [place] today! It's our pleasure caring for you.

## 2020-05-19 ENCOUNTER — Encounter (HOSPITAL_COMMUNITY): Payer: Medicare Other

## 2020-05-20 MED ORDER — KETOROLAC TROMETHAMINE 10 MG PO TABS: 10.0000 mg | ORAL_TABLET | Freq: Four times a day (QID) | ORAL | 0 refills | Status: DC | PRN

## 2020-05-21 ENCOUNTER — Encounter (HOSPITAL_COMMUNITY): Payer: Medicare Other

## 2020-05-25 NOTE — Progress Notes (Signed)
Discharge Progress Report  Patient Details  Name: Susan Hunt MRN: [id number] Date of Birth: 1945-04-02 Referring Provider:   April Manson Pulmonary Rehab Walk Test from 04/10/2020 in [place]  Referring Provider Dr. 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  Smoking History:  Social History   Tobacco Use  Smoking Status Former Smoker  . Packs/day: 0.50  . Years: 15.00  . Pack years: 7.50  . Types: Cigarettes  . Quit date: 08/18/2013  . Years since quitting: 6.7  Smokeless Tobacco Never Used    Diagnosis:  IPF (idiopathic pulmonary fibrosis) (HCC)  ADL UCSD:  Pulmonary Assessment Scores    Row Name 04/10/20 1142         ADL UCSD   ADL Phase Entry     SOB Score total 47           CAT Score   CAT Score 17           mMRC Score   mMRC Score 2            Initial Exercise Prescription:  Initial Exercise Prescription - 04/14/20 1500      Recumbant Bike   Level 1.5    RPM 60    Minutes 15    METs 1.8      NuStep   Level 2    SPM 80    Minutes 15    METs 2           Discharge Exercise Prescription (Final Exercise Prescription Changes):  Exercise Prescription Changes - 04/21/20 1400      Response to Exercise   Blood Pressure (Admit) 126/68    Blood Pressure (Exercise) 122/60    Blood Pressure (Exit) 122/56    Heart Rate (Admit) 62 bpm    Heart Rate (Exercise) 69 bpm    Heart Rate (Exit) 59 bpm    Oxygen Saturation (Admit) 95 %    Oxygen Saturation (Exercise) 92 %    Oxygen Saturation (Exit) 94 %    Rating of Perceived Exertion (Exercise) 7    Perceived Dyspnea (Exercise) 1    Duration Progress to 45 minutes of aerobic exercise without signs/symptoms of physical distress    Intensity Other (comment)   40-80% HR max     Progression   Progression Continue to progress  workloads to maintain intensity without signs/symptoms of physical distress.      Resistance Training   Training Prescription Yes    Weight Blue bands    Reps 10-15    Time 10 Minutes      NuStep   Level 3    SPM 85    Minutes 15    METs 2.3           Functional Capacity:  6 Minute Walk    Row Name 04/10/20 1151         6 Minute Walk   Phase Initial     Distance 1209 feet     Walk Time 6 minutes     # of Rest Breaks 0     MPH 2.29     METS 2.11     RPE 11     Perceived Dyspnea  2     VO2 Peak 7.39     Symptoms No  Resting HR 59 bpm     Resting BP 126/60     Resting Oxygen Saturation  95 %     Exercise Oxygen Saturation  during 6 min walk 90 %     Max Ex. HR 82 bpm     Max Ex. BP 142/66     2 Minute Post BP 140/66           Interval HR   1 Minute HR 74     2 Minute HR 72     3 Minute HR 76     4 Minute HR 82     5 Minute HR 77     6 Minute HR 79     2 Minute Post HR 63     Interval Heart Rate? Yes           Interval Oxygen   Interval Oxygen? Yes     Baseline Oxygen Saturation % 95 %     1 Minute Oxygen Saturation % 96 %     1 Minute Liters of Oxygen 0 L     2 Minute Oxygen Saturation % 92 %     2 Minute Liters of Oxygen 0 L     3 Minute Oxygen Saturation % 91 %     3 Minute Liters of Oxygen 0 L     4 Minute Oxygen Saturation % 91 %     4 Minute Liters of Oxygen 0 L     5 Minute Oxygen Saturation % 90 %     5 Minute Liters of Oxygen 0 L     6 Minute Oxygen Saturation % 91 %     6 Minute Liters of Oxygen 0 L     2 Minute Post Oxygen Saturation % 95 %     2 Minute Post Liters of Oxygen 0 L            Psychological, QOL, Others - Outcomes: PHQ 2/9: Depression screen [facility] 2/9 04/10/2020 03/26/2019 10/02/2017 02/14/2017  Decreased Interest 0 0 0 0  Down, Depressed, Hopeless 0 0 0 0  PHQ - 2 Score 0 0 0 0  Altered sleeping 0 0 0 -  Tired, decreased energy 0 3 0 -  Change in appetite 0 3 0 -  Feeling bad or failure about yourself  0 0 0 -   Trouble concentrating 0 0 0 -  Moving slowly or fidgety/restless 0 1 0 -  Suicidal thoughts 0 0 0 -  PHQ-9 Score 0 7 0 -  Difficult doing work/chores Not difficult at all Not difficult at all Not difficult at all -    Quality of Life:   Personal Goals: Goals established at orientation with interventions provided to work toward goal.  Personal Goals and Risk Factors at Admission - 04/20/20 1351      Core Components/Risk Factors/Patient Goals on Admission   Improve shortness of breath with ADL's Yes    Intervention Provide education, individualized exercise plan and daily activity instruction to help decrease symptoms of SOB with activities of daily living.    Expected Outcomes Short Term: Improve cardiorespiratory fitness to achieve a reduction of symptoms when performing ADLs;Long Term: Be able to perform more ADLs without symptoms or delay the onset of symptoms            Personal Goals Discharge:  Goals and Risk Factor Review    Row Name 04/20/20 1352  Core Components/Risk Factors/Patient Goals Review   Personal Goals Review Develop more efficient breathing techniques such as purse lipped breathing and diaphragmatic breathing and practicing self-pacing with activity.;Increase knowledge of respiratory medications and ability to use respiratory devices properly.;Improve shortness of breath with ADL's       Review Pam has attended 2 exercise sessions and is tolerating it well, should see dramatic improvement in the next 30 days.       Expected Outcomes See admission goals.              Exercise Goals and Review:  Exercise Goals    Row Name 04/10/20 1137             Exercise Goals   Increase Physical Activity Yes       Intervention Provide advice, education, support and counseling about physical activity/exercise needs.;Develop an individualized exercise prescription for aerobic and resistive training based on initial evaluation findings, risk  stratification, comorbidities and participant's personal goals.       Expected Outcomes Short Term: Attend rehab on a regular basis to increase amount of physical activity.;Long Term: Add in home exercise to make exercise part of routine and to increase amount of physical activity.;Long Term: Exercising regularly at least 3-5 days a week.       Increase Strength and Stamina Yes       Intervention Provide advice, education, support and counseling about physical activity/exercise needs.;Develop an individualized exercise prescription for aerobic and resistive training based on initial evaluation findings, risk stratification, comorbidities and participant's personal goals.       Expected Outcomes Short Term: Increase workloads from initial exercise prescription for resistance, speed, and METs.;Short Term: Perform resistance training exercises routinely during rehab and add in resistance training at home;Long Term: Improve cardiorespiratory fitness, muscular endurance and strength as measured by increased METs and functional capacity (6MWT)       Able to understand and use rate of perceived exertion (RPE) scale Yes       Intervention Provide education and explanation on how to use RPE scale       Expected Outcomes Short Term: Able to use RPE daily in rehab to express subjective intensity level;Long Term:  Able to use RPE to guide intensity level when exercising independently       Able to understand and use Dyspnea scale Yes       Intervention Provide education and explanation on how to use Dyspnea scale       Expected Outcomes Short Term: Able to use Dyspnea scale daily in rehab to express subjective sense of shortness of breath during exertion;Long Term: Able to use Dyspnea scale to guide intensity level when exercising independently       Knowledge and understanding of Target Heart Rate Range (THRR) Yes       Intervention Provide education and explanation of THRR including how the numbers were predicted  and where they are located for reference       Expected Outcomes Short Term: Able to state/look up THRR;Long Term: Able to use THRR to govern intensity when exercising independently;Short Term: Able to use daily as guideline for intensity in rehab       Understanding of Exercise Prescription Yes       Intervention Provide education, explanation, and written materials on patient's individual exercise prescription       Expected Outcomes Short Term: Able to explain program exercise prescription;Long Term: Able to explain home exercise prescription to exercise independently  Exercise Goals Re-Evaluation:  Exercise Goals Re-Evaluation    Row Name 04/21/20 0827             Exercise Goal Re-Evaluation   Exercise Goals Review Increase Physical Activity;Increase Strength and Stamina;Able to understand and use rate of perceived exertion (RPE) scale;Able to understand and use Dyspnea scale;Knowledge and understanding of Target Heart Rate Range (THRR);Understanding of Exercise Prescription       Comments Pam has completed 2 exercise sessions and has tolerated well so far with no complaints or concerns. It is too early to see any progression. Will continue to monitor and progress as she is able. She is currently exercising at 2.2 METS on the Nustep and 2.1 METS on the bike.       Expected Outcomes Through exercise at rehab and home the patient will decrease shortness of breath with daily activities and feel confident in carrying out an exercise regimn at home.              Nutrition & Weight - Outcomes:  Pre Biometrics - 04/10/20 1137      Pre Biometrics   Grip Strength 26 kg            Nutrition:  Nutrition Therapy & Goals - 04/21/20 1436      Nutrition Therapy   Diet TLC; low sodium      Personal Nutrition Goals   Nutrition Goal Pt to choose a healthy snack from list instead of choosing sweet snacks.    Personal Goal #2 Pt to identify food quantities necessary to  achieve weight loss of 6-24 lb at graduation from cardiac rehab.      Intervention Plan   Intervention Prescribe, educate and counsel regarding individualized specific dietary modifications aiming towards targeted core components such as weight, hypertension, lipid management, diabetes, heart failure and other comorbidities.;Nutrition handout(s) given to patient.    Expected Outcomes Short Term Goal: Understand basic principles of dietary content, such as calories, fat, sodium, cholesterol and nutrients.           Nutrition Discharge:   Education Questionnaire Score:  Knowledge Questionnaire Score - 04/10/20 1141      Knowledge Questionnaire Score   Pre Score 16/18           Goals reviewed with patient; copy given to patient.

## 2020-05-25 NOTE — Addendum Note (Signed)
Encounter addended by: Lance Morin, RN on: 05/25/2020 11:33 AM  Actions taken: Clinical Note Signed, Episode resolved

## 2020-05-26 ENCOUNTER — Encounter (HOSPITAL_COMMUNITY): Payer: Medicare Other

## 2020-05-28 ENCOUNTER — Encounter (HOSPITAL_COMMUNITY): Payer: Medicare Other

## 2020-05-28 ENCOUNTER — Other Ambulatory Visit: Payer: Self-pay

## 2020-05-28 DIAGNOSIS — Z1231 Encounter for screening mammogram for malignant neoplasm of breast: Secondary | ICD-10-CM

## 2020-06-02 ENCOUNTER — Encounter (HOSPITAL_COMMUNITY): Payer: Medicare Other

## 2020-06-04 ENCOUNTER — Encounter (HOSPITAL_COMMUNITY): Payer: Medicare Other

## 2020-06-05 ENCOUNTER — Telehealth (HOSPITAL_COMMUNITY): Payer: Self-pay | Admitting: Emergency Medicine

## 2020-06-05 NOTE — Telephone Encounter (Signed)
Reaching out to patient to offer assistance regarding upcoming cardiac imaging study; pt verbalizes understanding of appt date/time, parking situation and where to check in, pre-test NPO status and medications ordered, and verified current allergies; name and call back number provided for further questions should they arise Marchia Bond RN [place] [facility] [phone] office [phone] cell  Pt reports she wll bring her stimulator remote. Denies claustro Clarise Cruz

## 2020-06-08 ENCOUNTER — Other Ambulatory Visit: Payer: Self-pay

## 2020-06-08 ENCOUNTER — Ambulatory Visit (HOSPITAL_COMMUNITY)
Admission: RE | Admit: 2020-06-08 | Discharge: 2020-06-08 | Disposition: A | Discharge status: Home / Self Care | Payer: Medicare Other | Source: Ambulatory Visit | Attending: Internal Medicine | Admitting: Internal Medicine

## 2020-06-08 DIAGNOSIS — I428 Other cardiomyopathies: Secondary | ICD-10-CM | POA: Insufficient documentation

## 2020-06-08 MED ORDER — GADOBUTROL 1 MMOL/ML IV SOLN
8.0000 mL | Freq: Once | INTRAVENOUS | Status: AC | PRN
  Administered 2020-06-08: 8 mL via INTRAVENOUS

## 2020-06-09 ENCOUNTER — Encounter: Payer: Medicare Other | Admitting: Adult Health

## 2020-06-09 ENCOUNTER — Encounter (HOSPITAL_COMMUNITY): Payer: Medicare Other

## 2020-06-10 ENCOUNTER — Other Ambulatory Visit: Payer: Self-pay

## 2020-06-10 ENCOUNTER — Ambulatory Visit (INDEPENDENT_AMBULATORY_CARE_PROVIDER_SITE_OTHER): Payer: Medicare Other | Admitting: Family Medicine

## 2020-06-10 ENCOUNTER — Encounter: Payer: Self-pay | Admitting: Family Medicine

## 2020-06-10 VITALS — BP 130/62 | HR 71 | Temp 97.8°F | Wt 200.4 lb

## 2020-06-10 DIAGNOSIS — I5020 Unspecified systolic (congestive) heart failure: Secondary | ICD-10-CM | POA: Diagnosis not present

## 2020-06-10 DIAGNOSIS — J849 Interstitial pulmonary disease, unspecified: Secondary | ICD-10-CM | POA: Diagnosis not present

## 2020-06-10 DIAGNOSIS — E039 Hypothyroidism, unspecified: Secondary | ICD-10-CM | POA: Diagnosis not present

## 2020-06-10 DIAGNOSIS — R0789 Other chest pain: Secondary | ICD-10-CM

## 2020-06-10 DIAGNOSIS — E119 Type 2 diabetes mellitus without complications: Secondary | ICD-10-CM

## 2020-06-10 DIAGNOSIS — R0781 Pleurodynia: Secondary | ICD-10-CM

## 2020-06-10 LAB — POCT GLYCOSYLATED HEMOGLOBIN (HGB A1C): Hemoglobin A1C: 6.1 % — AB (ref 4.0–5.6)

## 2020-06-10 MED ORDER — GABAPENTIN 300 MG PO CAPS: 300.0000 mg | ORAL_CAPSULE | Freq: Two times a day (BID) | ORAL | 1 refills | Status: DC

## 2020-06-10 NOTE — Patient Instructions (Addendum)
Please return in September for your annual complete physical; please come fasting. I will release your lab results to you on your MyChart account with further instructions. Please reply with any questions.    Please call your lung doctors to see if they can help identify what is causing your chest pain.   If you have any questions or concerns, please don't hesitate to send me a message via MyChart or call the office at [phone]. Thank you for visiting with [place] today! It's our pleasure caring for you.

## 2020-06-10 NOTE — Progress Notes (Signed)
Subjective  CC:  Chief Complaint  Patient presents with  . Breast Pain    Right side near arm pit, seen previously for this. States there was no improvement with tramadol, Toradol, or oxycodone    HPI: Susan Hunt is a 75 y.o. female who presents to the office today for follow up of diabetes and problems listed above in the chief complaint.     See last note from early March.  Patient had a fall and had right-sided lower chest wall pain but also with pleuritic chest pain shortness of breath.  Not related to rib contusion.  Tried multiple medications for pain relief none were successful.  She reports she had a little bit better but 3 days ago got worse again.  Describes sharp stabbing pain underneath the right ribs.  No GI symptoms.  No fevers or chills.  No new injuries.  No rashes.  Feels like the same pain she had when she had her chest tube placed.  No cough or hemoptysis.  She does have interstitial lung disease and nonischemic cardiomyopathy.  She has been followed by pulmonology, cardiology and CVTS.  Diabetes follow up: Her diabetic control is reported as Unchanged.  She is on [place] and continue to eat a diabetic diet.  No symptoms of hypoglycemia. She denies exertional CP or SOB or symptomatic hypoglycemia. She denies foot sores or paresthesias.  She is overdue an eye exam.  Nonischemic cardiomyopathy: Continues with heart failure clinic.  Had recent cardiac MRI which did not show any coronary obstruction.  Has global dyskinesis.  Hypothyroidism: Was mildly overtreated back in September.  We adjusted her dose down.  She feels well clinically now.  Due for recheck. Wt Readings from Last 3 Encounters:  06/10/20 200 lb 6.4 oz (90.9 kg)  05/18/20 201 lb (91.2 kg)  04/21/20 197 lb 8.5 oz (89.6 kg)    BP Readings from Last 3 Encounters:  06/10/20 130/62  05/18/20 128/70  04/10/20 126/60    Assessment  1. Right-sided chest wall pain   2. Pleuritic chest pain   3.  Interstitial lung disease (HCC)   4. HFrEF (heart failure with reduced ejection fraction) ([place])   5. Controlled type 2 diabetes mellitus without complication, without long-term current use of insulin ([place])   6. Acquired hypothyroidism      Plan    Right-sided chest wall pain with pleuritic chest pain: Unclear etiology.  Chest x-ray and rib films were unrevealing.  Recommend follow-up with pulmonology and CVTS for further imaging and diagnostic evaluation.  Trial of gabapentin.  Diabetes is currently very well controlled.  Continue Farxiga 10 mg daily.  Recommend eye exam.  Immunizations are up-to-date.  Check renal function.  Hypothyroidism: Recheck TSH today.  Clinically euthyroid.  Heart failure managed by heart failure clinic.  Euvolemic today.  Follow up: September for complete physical and follow-up diabetes hypothyroidism. Orders Placed This Encounter  Procedures  . CBC with Differential/Platelet  . Sedimentation rate  . TSH  . Comprehensive metabolic panel  . POCT HgB A1C   Meds ordered this encounter  Medications  . gabapentin (NEURONTIN) 300 MG capsule    Sig: Take 1 capsule (300 mg total) by mouth 2 (two) times daily.    Dispense:  60 capsule    Refill:  1      Immunization History  Administered Date(s) Administered  . Fluad Quad(high Dose 65+) 12/09/2019  . Hepatitis A, Adult 04/06/2018  . Influenza Split 01/06/2009  . Influenza,  High Dose Seasonal PF 12/17/2014, 12/20/2015, 01/15/2017, 12/18/2017, 11/28/2018  . Influenza, Seasonal, Injecte, Preservative Fre 01/07/2014, 12/17/2014  . Influenza,inj,Quad PF,6+ Mos 01/15/2017  . Influenza,trivalent, recombinat, inj, PF 12/03/2012  . Influenza-Unspecified 01/15/2017  . Moderna SARS-COV2 Booster Vaccination 01/07/2020  . Moderna Sars-Covid-2 Vaccination 04/17/2019, 05/15/2019  . Pneumococcal Conjugate-13 01/07/2014  . Pneumococcal Polysaccharide-23 11/23/2011  . Td 03/14/2006, 10/16/2011  . Zoster 12/06/2013  .  Zoster Recombinat (Shingrix) 10/21/2017, 12/18/2017    Diabetes Related Lab Review: Lab Results  Component Value Date   HGBA1C 6.1 (A) 06/10/2020   HGBA1C 6.1 (A) 12/09/2019   HGBA1C 6.8 (H) 10/15/2019    Lab Results  Component Value Date   MICROALBUR <0.7 07/19/2019   Lab Results  Component Value Date   CREATININE 1.11 (H) 03/26/2020   BUN 15 03/26/2020   NA 139 03/26/2020   K 5.0 03/26/2020   CL 102 03/26/2020   CO2 24 03/26/2020   Lab Results  Component Value Date   CHOL 155 12/09/2019   CHOL 154 07/19/2019   CHOL 235 (H) 03/26/2019   Lab Results  Component Value Date   HDL 65 12/09/2019   HDL 64.20 07/19/2019   HDL 61.90 03/26/2019   Lab Results  Component Value Date   LDLCALC 73 12/09/2019   LDLCALC 61 07/19/2019   LDLCALC 142 (H) 03/26/2019   Lab Results  Component Value Date   TRIG 87 12/09/2019   TRIG 142.0 07/19/2019   TRIG 156.0 (H) 03/26/2019   Lab Results  Component Value Date   CHOLHDL 2.4 12/09/2019   CHOLHDL 2 07/19/2019   CHOLHDL 4 03/26/2019   Lab Results  Component Value Date   LDLDIRECT 146.7 05/29/2006   Lab Results  Component Value Date   TSH 0.31 (L) 12/09/2019    The 10-year ASCVD risk score Mikey Bussing DC Jr., et al., 2013) is: 37%   Values used to calculate the score:     Age: 62 years     Sex: Female     Is Non-Hispanic African American: No     Diabetic: Yes     Tobacco smoker: No     Systolic Blood Pressure: 622 mmHg     Is BP treated: Yes     HDL Cholesterol: 65 mg/dL     Total Cholesterol: 155 mg/dL I have reviewed the PMH, Fam and Soc history. Patient Active Problem List   Diagnosis Date Noted  . Interstitial lung disease ([place]) 11/01/2019    Priority: High  . HFrEF (heart failure with reduced ejection fraction) ([place]) 11/01/2019    Priority: High  . Controlled type 2 diabetes mellitus without complication, without long-term current use of insulin ([place]) 07/19/2019    Priority: High  . Tubular adenoma of colon  03/26/2019    Priority: High    Colonoscopy repeat in 3 years   . Panlobular emphysema ([place]) 06/17/2014    Priority: High    Overview:  Long-term former smoker, chest CT March 2016 shows early fibrotic changes and apices and emphysematous changes.  Inhalers started   . Major depression, chronic 01/28/2014    Priority: High  . Obesity (BMI 30.0-34.9) 07/15/2013    Priority: High  . Acquired hypothyroidism 04/11/2007    Priority: High  . Mixed hyperlipidemia 04/11/2007    Priority: High  . Essential hypertension 04/11/2007    Priority: High  . Recurrent UTI 04/12/2019    Priority: Medium    Daily prophylaxis started with [facility] January 2021   . Hepatic steatosis  03/26/2019    Priority: Medium  . Osteopenia of femoral neck 05/24/2016    Priority: Medium    Dexa 05/2019 T = - 1.7 femur. Stable. Recheck 2-3 years. Dexa 05/2016 T = -1.4; stable. No significant decrease. Recheck in 2-3 years.   Marland Kitchen RENAL CALCULUS, HX OF 04/11/2007    Priority: Medium  . ACE-inhibitor cough 10/02/2017    Priority: Low  . Postmenopausal symptoms 02/14/2017    Priority: Low    Uses clonidine prn at night to help her sleep IF needed for hot flushes or poor sleep. Started by GYN years ago. occasional use.   . Grade II hemorrhoids 05/03/2016    Priority: Low  . Snoring 05/06/2014    Priority: Low    Overview:  Negative sleep study 2013   . Allergic rhinitis 12/18/2009    Priority: Low    Overview:  10/1 IMO update   . Rosacea 04/11/2007    Priority: Low    Qualifier: Diagnosis of  By: Hulan Saas, CMA (AAMA), Shannon S    . URINARY INCONTINENCE 04/11/2007    Priority: Low    Qualifier: Diagnosis of  By: Regis Bill MD, Standley Brooking    . S/P robot-assisted surgical procedure 12/13/2019  . Chronic vertigo 07/19/2019  . Hyperglycemia 04/10/2019    Social History: Patient  reports that she quit smoking about 6 years ago. Her smoking use included cigarettes. She has a 7.50 pack-year smoking  history. She has never used smokeless tobacco. She reports previous alcohol use of about 1.0 standard drink of alcohol per week. She reports that she does not use drugs.  Review of Systems: Ophthalmic: negative for eye pain, loss of vision or double vision Cardiovascular: negative for chest pain Respiratory: negative for SOB or persistent cough Gastrointestinal: negative for abdominal pain Genitourinary: negative for dysuria or gross hematuria MSK: negative for foot lesions Neurologic: negative for weakness or gait disturbance  Objective  Vitals: BP 130/62   Pulse 71   Temp 97.8 F (36.6 C) (Temporal)   Wt 200 lb 6.4 oz (90.9 kg)   SpO2 97%   BMI 32.10 kg/m  General: well appearing, mildly uncomfortable appearing, moves well Psych:  Alert and oriented, normal mood and affect Right lower chest wall tenderness without right upper quadrant tenderness, no hepatosplenomegaly, no rashes.  No bruising    Diabetic education: ongoing education regarding chronic disease management for diabetes was given today. We continue to reinforce the ABC's of diabetic management: A1c (<7 or 8 dependent upon patient), tight blood pressure control, and cholesterol management with goal LDL < 100 minimally. We discuss diet strategies, exercise recommendations, medication options and possible side effects. At each visit, we review recommended immunizations and preventive care recommendations for diabetics and stress that good diabetic control can prevent other problems. See below for this patient's data.    Commons side effects, risks, benefits, and alternatives for medications and treatment plan prescribed today were discussed, and the patient expressed understanding of the given instructions. Patient is instructed to call or message via MyChart if he/she has any questions or concerns regarding our treatment plan. No barriers to understanding were identified. We discussed Red Flag symptoms and signs in detail.  Patient expressed understanding regarding what to do in case of urgent or emergency type symptoms.   Medication list was reconciled, printed and provided to the patient in AVS. Patient instructions and summary information was reviewed with the patient as documented in the AVS. This note was prepared with assistance of Dragon  voice recognition software. Occasional wrong-word or sound-a-like substitutions may have occurred due to the inherent limitations of voice recognition software  This visit occurred during the SARS-CoV-2 public health emergency.  Safety protocols were in place, including screening questions prior to the visit, additional usage of staff PPE, and extensive cleaning of exam room while observing appropriate contact time as indicated for disinfecting solutions.

## 2020-06-11 ENCOUNTER — Encounter (HOSPITAL_COMMUNITY): Payer: Medicare Other

## 2020-06-11 LAB — COMPREHENSIVE METABOLIC PANEL
ALT: 12 U/L (ref 0–35)
AST: 19 U/L (ref 0–37)
Albumin: 4.2 g/dL (ref 3.5–5.2)
Alkaline Phosphatase: 89 U/L (ref 39–117)
BUN: 19 mg/dL (ref 6–23)
CO2: 25 mEq/L (ref 19–32)
Calcium: 9.4 mg/dL (ref 8.4–10.5)
Chloride: 106 mEq/L (ref 96–112)
Creatinine, Ser: 0.99 mg/dL (ref 0.40–1.20)
GFR: 55.72 mL/min — ABNORMAL LOW (ref >60.00)
Glucose, Bld: 163 mg/dL — ABNORMAL HIGH (ref 70–99)
Potassium: 4.1 mEq/L (ref 3.5–5.1)
Sodium: 140 mEq/L (ref 135–145)
Total Bilirubin: 0.4 mg/dL (ref 0.2–1.2)
Total Protein: 6.9 g/dL (ref 6.0–8.3)

## 2020-06-11 LAB — CBC WITH DIFFERENTIAL/PLATELET
Basophils Absolute: 0.1 10*3/uL (ref 0.0–0.1)
Basophils Relative: 0.9 % (ref 0.0–3.0)
Eosinophils Absolute: 0.5 10*3/uL (ref 0.0–0.7)
Eosinophils Relative: 4.5 % (ref 0.0–5.0)
HCT: 42.3 % (ref 36.0–46.0)
Hemoglobin: 14 g/dL (ref 12.0–15.0)
Lymphocytes Relative: 21.8 % (ref 12.0–46.0)
Lymphs Abs: 2.3 10*3/uL (ref 0.7–4.0)
MCHC: 33.1 g/dL (ref 30.0–36.0)
MCV: 92.7 fl (ref 78.0–100.0)
Monocytes Absolute: 0.9 10*3/uL (ref 0.1–1.0)
Monocytes Relative: 8.1 % (ref 3.0–12.0)
Neutro Abs: 7 10*3/uL (ref 1.4–7.7)
Neutrophils Relative %: 64.7 % (ref 43.0–77.0)
Platelets: 262 10*3/uL (ref 150.0–400.0)
RBC: 4.57 Mil/uL (ref 3.87–5.11)
RDW: 14.4 % (ref 11.5–15.5)
WBC: 10.8 10*3/uL — ABNORMAL HIGH (ref 4.0–10.5)

## 2020-06-11 LAB — TSH: TSH: 2.66 u[IU]/mL (ref 0.35–4.50)

## 2020-06-11 LAB — SEDIMENTATION RATE: Sed Rate: 16 mm/hr (ref 0–30)

## 2020-06-16 ENCOUNTER — Other Ambulatory Visit: Payer: Self-pay | Admitting: Internal Medicine

## 2020-06-16 ENCOUNTER — Other Ambulatory Visit: Payer: Self-pay

## 2020-06-16 ENCOUNTER — Ambulatory Visit: Payer: Medicare Other | Admitting: Internal Medicine

## 2020-06-16 ENCOUNTER — Encounter: Payer: Self-pay | Admitting: Internal Medicine

## 2020-06-16 VITALS — BP 110/62 | HR 71 | Ht 67.0 in | Wt 201.8 lb

## 2020-06-16 DIAGNOSIS — I5020 Unspecified systolic (congestive) heart failure: Secondary | ICD-10-CM

## 2020-06-16 DIAGNOSIS — Z006 Encounter for examination for normal comparison and control in clinical research program: Secondary | ICD-10-CM

## 2020-06-16 DIAGNOSIS — I10 Essential (primary) hypertension: Secondary | ICD-10-CM

## 2020-06-16 DIAGNOSIS — J84112 Idiopathic pulmonary fibrosis: Secondary | ICD-10-CM

## 2020-06-16 DIAGNOSIS — I428 Other cardiomyopathies: Secondary | ICD-10-CM | POA: Diagnosis not present

## 2020-06-16 DIAGNOSIS — E782 Mixed hyperlipidemia: Secondary | ICD-10-CM

## 2020-06-16 NOTE — Progress Notes (Signed)
Cardiology Office Note:    Date:  06/16/2020   ID:  Susan Hunt, DOB 1946-02-12, MRN [id number]  PCP:  Leamon Arnt, MD  Cardiologist:  No primary care provider on file.  Electrophysiologist:  None   Referring MD: Leamon Arnt, MD   Chief Complaint/Reason for Referral: NICM  History of Present Illness:    Susan Hunt is a 75 y.o. female with a history of nonischemic cardiomyopathy with normal right and left heart pressures, hypertension, hyperlipidemia, interstitial lung disease with pulmonary fibrosis. Last visit 03/17/20.   Heart Failure Therapy currently ACE-I/ARB/ARNI: Valsartan 320 mg daily BB: carvedilol 25 mg BID, transitioned from toprol xl for better BP control. MRA:  spironolactone 75 mg daily SGLT2I: farxiga 10 mg daily   Also on amlodipine 5 mg daily for HTN.    Previously tried:  Entresto 49/51 mg (dry/itchy mouth, dry eyes) Lisinopril-hctz (SOB, coughing) Metoprolol tartrate (SOB, coughing) Clonidine  We reviewed results of MRI obtained for etiology of HF: IMPRESSION: 1. Mild-moderately reduced left ventricular systolic function with mild left ventricular enlargement. LVEF 41%. Moderate global hypokinesis.   2. Mildly reduced right ventricular systolic function with normal right ventricular size, RVEF 42%.   3. Midmyocardial stripe of delayed enhancement at base and mid ventricle which is a nonspecific finding and can be seen in dilated cardiomyopathy. No definite findings to suggest scar, fibrosis, inflammation, infiltrative process or infarction.   4. Mild inferior RV insertion site delayed myocardial enhancement may suggest increased pulmonary pressure.   We reviewed that at this time, my suspicion is that her HF was driven by hypertension given no clear other cause and need for multidrug therapy for best BP control.   Past Medical History:  Diagnosis Date   Anemia    Asthma    "I dont think I have it.   CHF (congestive heart  failure) (HCC)    Depression    Emphysema of lung (HCC)    GERD (gastroesophageal reflux disease)    Hemorrhoids    History of kidney stones    Cystoscopy   Hyperlipidemia    Hypertension    Hypothyroidism    Obesity    Pre-diabetes    Rosacea    Tubular adenoma of colon 03/26/2019   Colonoscopy repeat in 3 years    Past Surgical History:  Procedure Laterality Date   ABDOMINAL HYSTERECTOMY     ANTERIOR AND POSTERIOR VAGINAL REPAIR  2020   CARDIAC CATHETERIZATION  11/01/2019   CATARACT EXTRACTION W/ INTRAOCULAR LENS IMPLANT Bilateral 2016   COLONOSCOPY     CYSTOSCOPY     DENTAL SURGERY  2016   Dental Implant   EYE SURGERY Bilateral    cataract   FRACTURE SURGERY Left 2013   Left ankle   INTERCOSTAL NERVE BLOCK Right 12/13/2019   Procedure: INTERCOSTAL NERVE BLOCK;  Surgeon: Melrose Nakayama, MD;  Location: [facility];  Service: Thoracic;  Laterality: Right;   LUNG BIOPSY Right 12/13/2019   Procedure: LUNG BIOPSY;  Surgeon: Melrose Nakayama, MD;  Location: [facility];  Service: Thoracic;  Laterality: Right;   LYMPH NODE BIOPSY Right 12/13/2019   Procedure: LYMPH NODE BIOPSY;  Surgeon: Melrose Nakayama, MD;  Location: [facility];  Service: Thoracic;  Laterality: Right;   medtronix nerve stimulator  07/18/2019   Complete InterStim Sacral Neuromudulation system implantation 07/18/19, [facility]   NASAL SINUS SURGERY     RIGHT/LEFT HEART CATH AND CORONARY ANGIOGRAPHY N/A 11/01/2019   Procedure: RIGHT/LEFT HEART CATH AND  CORONARY ANGIOGRAPHY;  Surgeon: Martinique, Peter M, MD;  Location: [facility];  Service: Cardiovascular;  Laterality: N/A;   VAGINECTOMY, PARTIAL     VIDEO BRONCHOSCOPY N/A 12/13/2019   Procedure: VIDEO BRONCHOSCOPY;  Surgeon: Melrose Nakayama, MD;  Location: [facility];  Service: Thoracic;  Laterality: N/A;    Current Medications: Current Meds  Medication Sig   acetaminophen (TYLENOL) 500 MG tablet Take 2 tablets (1,000 mg total) by mouth every 6 (six) hours as  needed.   carvedilol (COREG) 25 MG tablet Take 1 tablet (25 mg total) by mouth 2 (two) times daily.   cephALEXin (KEFLEX) 250 MG capsule Take 250 mg by mouth at bedtime.   cetirizine (ZYRTEC) 10 MG tablet Take 10 mg by mouth daily.   FARXIGA 10 MG TABS tablet Take 10 mg by mouth daily.   fluticasone (FLONASE) 50 MCG/ACT nasal spray Place 2 sprays into both nostrils at bedtime as needed (allergies.).    gabapentin (NEURONTIN) 300 MG capsule Take 1 capsule (300 mg total) by mouth 2 (two) times daily.   Multiple Vitamin (MULTIVITAMIN WITH MINERALS) TABS tablet Take 1 tablet by mouth daily.   omeprazole (PRILOSEC) 20 MG capsule TAKE ONE CAPSULE BY MOUTH DAILY   ondansetron (ZOFRAN-ODT) 4 MG disintegrating tablet DISSOLVE ONE TABLET BY MOUTH EVERY 8 HOURS AS NEEDED FOR NAUSEA/ VOMITING   Pirfenidone (ESBRIET) 801 MG TABS Take 801 mg by mouth with breakfast, with lunch, and with evening meal.   rosuvastatin (CRESTOR) 10 MG tablet TAKE ONE TABLET BY MOUTH DAILY   spironolactone (ALDACTONE) 50 MG tablet Take 1.5 tablets (75 mg total) by mouth daily.   valsartan (DIOVAN) 320 MG tablet Take 1 tablet (320 mg total) by mouth daily.   venlafaxine XR (EFFEXOR-XR) 150 MG 24 hr capsule Take 1 capsule (150 mg total) by mouth daily.   [DISCONTINUED] amLODipine (NORVASC) 5 MG tablet Take 1 tablet (5 mg total) by mouth daily.   [DISCONTINUED] levothyroxine (SYNTHROID) 125 MCG tablet TAKE ONE TABLET BY MOUTH DAILY   [DISCONTINUED] montelukast (SINGULAIR) 10 MG tablet TAKE ONE TABLET BY MOUTH EVERY NIGHT AT BEDTIME     Allergies:   Ace inhibitors, Clarithromycin, Codeine, and Telmisartan   Social History   Tobacco Use   Smoking status: Former Smoker    Packs/day: 0.50    Years: 15.00    Pack years: 7.50    Types: Cigarettes    Quit date: 08/18/2013    Years since quitting: 6.8   Smokeless tobacco: Never Used  Vaping Use   Vaping Use: Never used  Substance Use Topics   Alcohol use: Not Currently     Alcohol/week: 1.0 standard drink    Types: 1 Glasses of wine per week    Comment: 2-3 glasses of wine weekly   Drug use: No     Family History: The patient's family history includes Alcohol abuse in her father; Diabetes in her maternal grandmother; Drug abuse in her son; Heart disease in her mother; Hypertension in her mother; Osteoporosis in her mother; Stroke in her mother. There is no history of Breast cancer.  ROS:   Please see the history of present illness.    All other systems reviewed and are negative.  EKGs/Labs/Other Studies Reviewed:    The following studies were reviewed today:  EKG:  SR, occasional PVCs, nonspecific ST T wave abnl.  Recent Labs: 06/10/2020: ALT 12; BUN 19; Creatinine, Ser 0.99; Hemoglobin 14.0; Platelets 262.0; Potassium 4.1; Sodium 140; TSH 2.66  Recent  Lipid Panel    Component Value Date/Time   CHOL 155 12/09/2019 1645   TRIG 87 12/09/2019 1645   HDL 65 12/09/2019 1645   CHOLHDL 2.4 12/09/2019 1645   VLDL 28.4 07/19/2019 1333   LDLCALC 73 12/09/2019 1645   LDLDIRECT 146.7 05/29/2006 1115    Physical Exam:    VS:  BP 110/62 (BP Location: Right Arm, Patient Position: Sitting, Cuff Size: Normal)   Pulse 71   Ht _0  (1.702 m)   Wt 201 lb 12.8 oz (91.5 kg)   SpO2 99%   BMI 31.61 kg/m     Wt Readings from Last 5 Encounters:  06/10/20 200 lb 6.4 oz (90.9 kg)  05/18/20 201 lb (91.2 kg)  04/21/20 197 lb 8.5 oz (89.6 kg)  04/10/20 197 lb 8.5 oz (89.6 kg)  04/02/20 197 lb 6.4 oz (89.5 kg)    Constitutional: No acute distress Eyes: sclera non-icteric, normal conjunctiva and lids ENMT: normal dentition, moist mucous membranes Cardiovascular: regular rhythm, normal rate, no murmurs. S1 and S2 normal. Radial pulses normal bilaterally. No jugular venous distention.  Respiratory: clear to auscultation bilaterally GI : normal bowel sounds, soft and nontender. No distention.   MSK: extremities warm, well perfused. No edema.  NEURO: grossly  nonfocal exam, moves all extremities. PSYCH: alert and oriented x 3, normal mood and affect.   ASSESSMENT:    1. HFrEF (heart failure with reduced ejection fraction) ([place])   2. Nonischemic cardiomyopathy ([place])   3. Essential hypertension   4. Mixed hyperlipidemia    PLAN:    HFrEF (heart failure with reduced ejection fraction) ([place]) - Plan: EKG 12-Lead Nonischemic cardiomyopathy ([place]) Essential hypertension - She is currently stable with class I-II dyspnea likely secondary to IPF as she appears well compensated from HF standpoint and EF is approximately 41% on MRI.  -Heart Failure Therapy  ACE-I/ARB/ARNI: Valsartan 320 mg daily BB: carvedilol 25 mg BID, transitioned from toprol xl for better BP control. MRA:  spironolactone 75 mg daily SGLT2I: farxiga 10 mg daily Loop diuretic: has not required.  Also on amlodipine 5 mg daily for HTN. BP well controlled today. No hypotension or symptomatic low BP.  Mixed hyperlipidemia - well treated with crestor 10 mg daily.   Total time of encounter: 30 minutes total time of encounter, including 20 minutes spent in face-to-face patient care on the date of this encounter. This time includes coordination of care and counseling regarding above mentioned problem list. Remainder of non-face-to-face time involved reviewing chart documents/testing relevant to the patient encounter and documentation in the medical record. I have independently reviewed documentation from referring provider.   Cherlynn Kaiser, MD, [facility]    Medication Adjustments/Labs and Tests Ordered: Current medicines are reviewed at length with the patient today.  Concerns regarding medicines are outlined above.   Orders Placed This Encounter  Procedures   EKG 12-Lead    No orders of the defined types were placed in this encounter.   Patient Instructions  Medication Instructions:  No Changes In Medications at this time.  *If you need a refill on  your cardiac medications before your next appointment, please call your pharmacy*  Follow-Up: At [facility], you and your health needs are our priority.  As part of our continuing mission to provide you with exceptional heart care, we have created designated Provider Care Teams.  These Care Teams include your primary Cardiologist (physician) and Advanced Practice Providers (APPs -  Physician Assistants and Nurse  Practitioners) who all work together to provide you with the care you need, when you need it.  Your next appointment:   6 month(s)  The format for your next appointment:   In Person  Provider:   Cherlynn Kaiser, MD

## 2020-06-16 NOTE — Patient Instructions (Signed)
Medication Instructions:  No Changes In Medications at this time.  *If you need a refill on your cardiac medications before your next appointment, please call your pharmacy*  Follow-Up: At [facility], you and your health needs are our priority.  As part of our continuing mission to provide you with exceptional heart care, we have created designated Provider Care Teams.  These Care Teams include your primary Cardiologist (physician) and Advanced Practice Providers (APPs -  Physician Assistants and Nurse Practitioners) who all work together to provide you with the care you need, when you need it.  Your next appointment:   6 month(s)  The format for your next appointment:   In Person  Provider:   Cherlynn Kaiser, MD

## 2020-06-18 ENCOUNTER — Telehealth: Payer: Self-pay | Admitting: Family Medicine

## 2020-06-18 NOTE — Chronic Care Management (AMB) (Signed)
  Chronic Care Management   Note  06/18/2020 Name: TEANNA ELEM MRN: [id number] DOB: September 29, 1945  DAVEN PINCKNEY is a 75 y.o. year old female who is a primary care patient of Leamon Arnt, MD. I reached out to [name] by phone today in response to a referral sent by Ms. Nobie Putnam Zaragosa's PCP, Leamon Arnt, MD.   Ms. Faivre was given information about Chronic Care Management services today including:  1. CCM service includes personalized support from designated clinical staff supervised by her physician, including individualized plan of care and coordination with other care providers 2. 24/7 contact phone numbers for assistance for urgent and routine care needs. 3. Service will only be billed when office clinical staff spend 20 minutes or more in a month to coordinate care. 4. Only one practitioner may furnish and bill the service in a calendar month. 5. The patient may stop CCM services at any time (effective at the end of the month) by phone call to the office staff.   Patient agreed to services and verbal consent obtained.   Follow up plan:   Lauretta Grill [employer] Scheduler

## 2020-06-18 NOTE — Chronic Care Management (AMB) (Signed)
  Chronic Care Management   Outreach Note  06/18/2020 Name: Susan Hunt MRN: [id number] DOB: [birth year]  Referred by: Leamon Arnt, MD Reason for referral : No chief complaint on file.   An unsuccessful telephone outreach was attempted today. The patient was referred to the pharmacist for assistance with care management and care coordination.   Follow Up Plan:   Lauretta Grill Upstream Scheduler

## 2020-06-19 ENCOUNTER — Other Ambulatory Visit: Payer: Self-pay | Admitting: Family Medicine

## 2020-06-19 DIAGNOSIS — I10 Essential (primary) hypertension: Secondary | ICD-10-CM

## 2020-06-24 ENCOUNTER — Encounter: Payer: Self-pay | Admitting: Adult Health

## 2020-06-24 ENCOUNTER — Encounter: Payer: Medicare Other | Admitting: *Deleted

## 2020-06-24 ENCOUNTER — Other Ambulatory Visit: Payer: Self-pay

## 2020-06-24 ENCOUNTER — Encounter (INDEPENDENT_AMBULATORY_CARE_PROVIDER_SITE_OTHER): Payer: Medicare Other | Admitting: Adult Health

## 2020-06-24 DIAGNOSIS — J849 Interstitial pulmonary disease, unspecified: Secondary | ICD-10-CM

## 2020-06-24 DIAGNOSIS — Z006 Encounter for examination for normal comparison and control in clinical research program: Secondary | ICD-10-CM | POA: Insufficient documentation

## 2020-06-24 DIAGNOSIS — J84112 Idiopathic pulmonary fibrosis: Secondary | ICD-10-CM

## 2020-06-24 NOTE — Assessment & Plan Note (Signed)
Continue on current regimen .   

## 2020-06-24 NOTE — Research (Signed)
Title: A randomized, double-blind, multicenter, dose-ranging, placebo-controlled Phase 2a evaluation of the safety, tolerability and pharmacokinetics of [id number] in participants with idiopathic pulmonary fibrosis (IPF)   Patients will be randomized in a 3:1 ratio to one of two treatment arms:  320 mg [id number] or placebo.   Protocol #: [id number], Clinical Trials EUDRACT# [id number]  ** Sponsor [employer] [email or web address]   ([place] [address])   Protocol Version Amendment 3 (ver.3.0) for 06/24/2020 Date of 19OCT2021 double checked yes Consent Version 5.1 dated 11Jan2022, approved  for 06/24/2020 date of double checked yes Investigator Brochure Version 5.0  Addendum for 06/24/2020 date of 21JAN2022   Key Inclusion Criteria:   Age >/= 76 years old Confident diagnosis of IPF  up to 5 years. If IPF diagnosis is within > 3 to = 5 years at screening, the participant must have evidence of progression within the last 24 months, as defined by decline in FVC percent predicted based on a relative decline of = 5% FVC% pred >/=45%  DLCO (hgb-adjusted) >/=30% Treatment with nintedanib or pirfenidone allowed, But must have stable dose =3 months before Screening Visit and remain unchanged during the study   Key Exclusion Criteria:   Receiving any non-approved agent intended for treatment of fibrosis in IPF FEV1 over the FVC ratio < 0.7 at screening Active infection,  that can affect FVC measurement during Screening or  Randomization Extent of emphysema > fibrotic changes on most recent HRCT IPF exacerbation within 6 months of Screening  Severe pulmonary hypertension Smoking of any kind w/in 3 months of Screening or unwilling to avoid smoking throughout the study Renal impairment or end-stage kidney disease requiring dialysis History of unstable /deteriorating cardiac/pulmonary disease (other than IPF) w/in 6 months of Screening Any ongoing known malignancy, except for  localized cancers (i.e. basal cell carcinoma) Medical or surgical condition known to affect drug absorption Surgeries scheduled during the study period    Side-Effects: [id number] was generally well tolerated in over 280 healthy participants with no clinical safety concerns or dose-limiting toxicities observed for single doses up to 659m and multiple doses up to 3218mQD administered for up to 14 days. The major side-effects reported have been headache (mild) and constipation (mild) in 3-76% of subjects. Other side effects, in 1-2% of subjects, were mild to moderate, with fever being the only severe side-effect reported.  Mechanism: Selective dual aV6 and aV1 integrin inhibitor with antifibrotic activity. [id number]-43329revents aV6 and aV1 from binding to the RGD sequence of latency-associated peptide (LAP), this blocks the release of activated TGF-1, thereby blocking the activation of pro-fibrotic pathways. Additionally, PLJJO-84166as been shown to reduce collagen synthesis and gene expression in human IPF tissue.   Interactions: Acts as a substrate of P-gp, OATP1B1, OATP1B3, OAT3 Inhibitors or inducers of these enzymes may affect the intestinal absorption, hepatic uptake or renal uptake of [id number]-01601etabolized by CYP3A4, CYP2C9, CYP2C19 Potent inhibitors or inducers of these enzymes should be avoided Each CYP enzyme metabolizes < 10% of the parent compound Since multiple CYP enzymes are involved, clinically relevant drug-drug interactions are less likely, as there are other alternative pathways A potent inhibitor of multiple pathways should be used with caution as that has the potential to elicit a clinically relevant drug-drug interaction i.e. medication that inhibits CYP3A4, CYP2C9, CYP2C19 PLUXN-23557s not an inducer of CYP1A2, CYP2B6, or CYP3A4 Showed moderate inhibition of P-gp and MATE1, substrates of P-gp and MATE1 may  have increased exposure when coadministered with  PLDUK-02542etabolized by extrahepatic  enzymes: CYP1A1, UGT1A7 Clinical relevance relating to drug interactions is unknown Stable when incubated with human recombinant MAO-A and MAO-B KKX-38182 is not an inhibitor or inducer of CYP isoforms and may be co-administered with substrates of CYP isoforms without risk of DDI.  Interactions with specific drugs: Potential for increased exposure of nintedanib Recommend separating time of administration of XHB-71696 and nintedanib Drug interactions with pirfenidone are not expected Use of corticosteroids should be discussed beforehand with the medical monitor   Clinical drug interactions studies have not been performed at this time and therefore guidance is based on the in-vitro studies  Disallowed Medications/Foods/Supplements: Fluconazole Digoxin Rifampin Grapefruit Grapefruit-containing foods and beverages St. John's Wort   Pharmacokinetics: Tmax : 2-3 hours Half-life: approximately 48 hours Steady state reached in 5-7 days Minimal renal clearance (8.5% of drug is cleared by the kidneys)    Administration [phone] should be administered on an empty stomach May be administered via feeding tube   Safety:   Edition Addendum to Edition 5.0 - 03 April 2020   Animal studies: No effect on neurobehavior No effect on respiratory parameters No effects on blood pressure Showed an increase in QT and QTc The high dose 400 mg/kg showed an increase in mean heart rate   Cardiovascular effects in mice after administration of BPZ-02585: Dose-dependent increases in blood pressure Increase in PR and QRS duration Increase in arrhythmias  Reproductive animal toxicology studies at the low and medium doses, 150 and 300 mg/kg/day, revealed no impact on: maternal survival, fetal body weight, food parameters, uterine and ovarian parameters, no external malformations  When the high dose 600 mg/kg/day was tested in rabbits, the rabbits displayed  increased morbidities and increased abortions   Phase 1 studies:   Type of TEAE [id number] n=32 Placebo n=14 GI disorders 7 (22%) 0 Infections 3 (9%) 0 Musculoskeletal 1 (3%) 0 Nervous system disorders 2 (6%) 0   Most common treatment-emergent adverse event (TEAE):  headache  The most common drug-related adverse events were: nausea, diarrhea, abdominal distension. These occurred in 12.5% of participants  There were no deaths or serious adverse events in any of the Phase I studies There were no clinically meaningful changes in vital signs, telemetry or laboratory parameters [phone] did not display any clinically significant effect on ECG (iIncluding Qtc, HR, RR, PR and QRS)  Study [id number] All TEAEs were mild and deemed unrelated to study drug One TEAE, viral infection, led to discontinuation of IP No deaths nor SAEs  As of the 13 December 2019 cutoff date, preliminary data from ongoing and discontinued Phase 2a studies suggest no new safety concerns.  All SAE's reported to date were considered unrelated to study drug by the investigator. All treatment-emergent SAE's were known manifestations of the underlying  diseases (IPF, PSC or ARDS) and considered by the investigator to be most likely due to disease progresssion.  Part D of the study will evaluate approximately 28 participants at the 320 mg dose for at least 24 weeks to further characterize the safety profile of [id number]-67124. Ongoing safety and FVC assessments will continue until the last participant reaches 24 weeks of the study; therefore, safety and FVC assessments may extend to 48 weeks in participants randomized earlier in the study.  Clinical Research Coordinator / Research RN note : This visit for Subject DANIA MARSAN with DOB: 06/25/1945 on 06/24/2020 for the above protocol is Visit #1 and is for purpose of research.   The consent for this encounter is under Protocol Version Amendment 3.0 ,  Investigator Brochure Edition v 5.0 Addendum Consent Version 5.1 and is currently IRB approved.   Subject expressed continued interest and consent in continuing as a study subject. Subject confirmed that there was no change in contact information (e.g. address, telephone, email). Subject thanked for participation in research and contribution to science.  In this visit 06/24/2020 the subject will be evaluated by sub-investigator named Rexene Edison, NP  . This research coordinator has verified that the investigator is uptodate with her training logs.   Because the PI is NOT available due to schedule issues, the sub-I reported and CRC has confirmed that the PI has discussed the visit a-priori with the sub-investigator.  The consent was reviewed with the subject and all questions were answered. No study related procedures were performed prior to consent being signed. The sub-I, Tammy Parrett, NP, was present for the consent process. Refer to the Consent Process Documentation Checklist in the subject's study binder for further details of the consent process. After consent was signed all study visit 1 procedures and assessments were performed according to the above mentioned protocol. Refer to the subject's study binder for further details of the visit. She will return within the next 28 days for study visit 2 if all screening criteria are met.  Signed by Hale Drone, MS, [facility] Coordinator  [facility]  [place], [place] 2:51 PM 06/24/2020

## 2020-06-24 NOTE — Progress Notes (Signed)
_0  ID: Susan Hunt, female    DOB: Jan 10, 1946, 75 y.o.   MRN: [id number]  Chief Complaint  Patient presents with  . Follow-up    Referring provider: Leamon Arnt, MD  HPI: 75 year old female former smoker followed for IPF  Title: A randomized, double-blind, dose-ranging, placebo-controlled Phase 2a evaluation of the safety, tolerability and pharmacokinetics of [id number] in participants with idiopathic pulmonary fibrosis (IPF)   Patients will be randomized to one of three treatment arms: 20 mg [id number], 40 mg [id number], or placebo.     Key Inclusion Criteria:   Age ? 74 years old  Confident diagnosis of IPF  w/in 3 years. FVC% pred ?45% and ?90% DLco (hgb-adjusted) ?30% Treatment with nintedanib or pirfenidone allowed, But must have stable dose ?3 months before Screening Visit and remain unchanged during the study administration of study drug     Key Exclusion Criteria:   Receiving any non-approved agent intended for treatment of fibrosis in IPF FEV1 over the FVC ratio < 0.7 at screening Continuous supplemental oxygen, defined as >15 hrs/day at randomization Active infection,  that can affect FVC measurement during Screening or  Randomization Smoking of any kind w/in 3 months of Screening or unwilling to avoid smoking throughout the study History of unstable /deteriorating cardiac/pulmonary disease (other than IPF) w/in 6 months of Screening Any ongoing known malignancy, except for localized cancers (i.e. basal cell carcinoma) Participants receiving treatment with fluconazole or digoxin at Screening or during the StudyMedical or surgical condition known to affect drug absorption  Surgeries scheduled during the study period   Key features: Selective inhibitor of ?v?6 and ?v?1 integrin.  By inhibiting these transmembrane proteins, KWI-09735 prevents ?v?6 and ?v?1 from binding to latency-associated peptide (LAP) and prevents the release of TGF-?1 into the  extracellular matrix. Therefore, HGD-92426 showed reduced collagen synthesis and demonstrated antifibrotic activity.   Pharmacokinetics, based on non-clinical studies: STM-19622 is a moderate inhibitor of P-gp and BCRP It is metabolized predominantly by CYP-3A4, CYP-2C9 and CYP-2C19 and to a smaller extent by CYP1A1 Monitor for drugs that inhibit or induce any of these enzymes WLN-98921 is a substrate of P-gp, BCRP, OATP1B1 t/12 in animals ranged from 0.8-2 hours   Drug Interactions: Avoid fluconazole as it may cause increased exposure of JHE-17408 Avoid digoxin as it is a P-gp substrate Potential drug-drug interaction with nintedanib: Recommend separating the administration times of XKG-81856 and nintedanib    Animal studies One study in mice showed the potential for pro-inflammatory effects in the lung In one study involving monkeys who received the high dose (1000 mg/kg/day): Two deaths occurred within a few hours of receiving the dose, deemed to be related to the investigational product Monkeys exhibited CNS symptoms such as: ptosis, tremors, abnormal gait Some monkeys experienced QT and QTc prolongation  Phase 1 study Part A: Single Ascending Dose study Four cohorts: 15 mg, 30 mg, 50 mg, 75 mg Each cohort had 10 subjects (8 active, 2 placebo) Each subject received one dose under fasting conditions   Part B: Multiple Ascending Dose study Three cohorts: subjects received 10 mg, 20 mg, or 40 mg every day for 14 days Each cohort had 11 subjects (9 active, 2 placebo)   Part C: Food effect study Twelve subjects received 2 doses of 40 mg DJS-97026: One dose under fasting conditions and one dose under fed conditions Results: Receiving a dose in the fed state, resulted in a reduction in the Cmax by 52% and a reduction in the AUC by  37% It is recommended that KPV-37482 to be administered on an empty stomach   Overall safety: 11/71 (15%) of subjects who received LMB-86754 experienced a  treatment emergent adverse effect (TEAE) 5/71 (7%) of subjects experienced constipation All other TEAEs occurred in only 1 subject (1.4%) [Table 9] Epigastric discomfort, frequent bowel movements, tooth abscess, upper respiratory tract infection, muscle spasm, headache, migraine, dermatitis The only event listed above deemed related to study drug is epigastric discomfort  One subject discontinued the study drug due to a concurrent viral illness; this was deemed unrelated to the study drug There were no other TEAE that led to study drug discontinuation There were no deaths or serious adverse events There were no clinically significant changes in vital signs, ECGs, or laboratory parameters Overall well tolerated; the most frequently reported TEAE was mild constipation, no other TEAE was reported more than once  No mention of renal nor hepatic issues   06/24/2020 Research Visit #1   This visit for Subject Susan Hunt with DOB: October 30, 1945 on 06/24/2020 for the above protocol is Visit/Encounter # 1   and is for purpose of consent  . Subject/LAR expressed continued interest and consent in continuing as a study subject. Subject thanked for participation in research and contribution to science. Physical exam per research protocol.   Allergies  Allergen Reactions  . Ace Inhibitors Cough  . Clarithromycin     REACTION: GI sx  . Codeine Nausea Only  . Telmisartan Other (See Comments)    sleepy    Immunization History  Administered Date(s) Administered  . Fluad Quad(high Dose 65+) 12/09/2019  . Hepatitis A, Adult 04/06/2018  . Influenza Split 01/06/2009  . Influenza, High Dose Seasonal PF 12/17/2014, 12/20/2015, 01/15/2017, 12/18/2017, 11/28/2018  . Influenza, Seasonal, Injecte, Preservative Fre 01/07/2014, 12/17/2014  . Influenza,inj,Quad PF,6+ Mos 01/15/2017  . Influenza,trivalent, recombinat, inj, PF 12/03/2012  . Influenza-Unspecified 01/15/2017  . Moderna SARS-COV2 Booster Vaccination  01/07/2020  . Moderna Sars-Covid-2 Vaccination 04/17/2019, 05/15/2019  . Pneumococcal Conjugate-13 01/07/2014  . Pneumococcal Polysaccharide-23 11/23/2011  . Td 03/14/2006, 10/16/2011  . Zoster 12/06/2013  . Zoster Recombinat (Shingrix) 10/21/2017, 12/18/2017    Past Medical History:  Diagnosis Date  . Anemia   . Asthma    "I dont think I have it.  . CHF (congestive heart failure) ([place])   . Depression   . Emphysema of lung ([place])   . GERD (gastroesophageal reflux disease)   . Hemorrhoids   . History of kidney stones    Cystoscopy  . Hyperlipidemia   . Hypertension   . Hypothyroidism   . Obesity   . Pre-diabetes   . Rosacea   . Tubular adenoma of colon 03/26/2019   Colonoscopy repeat in 3 years    Tobacco History: Social History   Tobacco Use  Smoking Status Former Smoker  . Packs/day: 0.50  . Years: 15.00  . Pack years: 7.50  . Types: Cigarettes  . Quit date: 08/18/2013  . Years since quitting: 6.8  Smokeless Tobacco Never Used   Counseling given: Not Answered   Outpatient Medications Prior to Visit  Medication Sig Dispense Refill  . acetaminophen (TYLENOL) 500 MG tablet Take 2 tablets (1,000 mg total) by mouth every 6 (six) hours as needed. 30 tablet 0  . amLODipine (NORVASC) 5 MG tablet TAKE ONE TABLET BY MOUTH DAILY 90 tablet 3  . carvedilol (COREG) 25 MG tablet Take 1 tablet (25 mg total) by mouth 2 (two) times daily. 180 tablet 3  . cephALEXin (  KEFLEX) 250 MG capsule Take 250 mg by mouth at bedtime.    . cetirizine (ZYRTEC) 10 MG tablet Take 10 mg by mouth daily.    Marland Kitchen FARXIGA 10 MG TABS tablet Take 10 mg by mouth daily.    . fluticasone (FLONASE) 50 MCG/ACT nasal spray Place 2 sprays into both nostrils at bedtime as needed (allergies.).     Marland Kitchen gabapentin (NEURONTIN) 300 MG capsule Take 1 capsule (300 mg total) by mouth 2 (two) times daily. 60 capsule 1  . levothyroxine (SYNTHROID) 125 MCG tablet TAKE ONE TABLET BY MOUTH DAILY 90 tablet 0  . montelukast  (SINGULAIR) 10 MG tablet TAKE ONE TABLET BY MOUTH EVERY NIGHT AT BEDTIME 90 tablet 1  . Multiple Vitamin (MULTIVITAMIN WITH MINERALS) TABS tablet Take 1 tablet by mouth daily.    Marland Kitchen omeprazole (PRILOSEC) 20 MG capsule TAKE ONE CAPSULE BY MOUTH DAILY 90 capsule 3  . ondansetron (ZOFRAN-ODT) 4 MG disintegrating tablet DISSOLVE ONE TABLET BY MOUTH EVERY 8 HOURS AS NEEDED FOR NAUSEA/ VOMITING 20 tablet 0  . Pirfenidone (ESBRIET) 801 MG TABS Take 801 mg by mouth with breakfast, with lunch, and with evening meal. 90 tablet 3  . rosuvastatin (CRESTOR) 10 MG tablet TAKE ONE TABLET BY MOUTH DAILY 90 tablet 3  . spironolactone (ALDACTONE) 50 MG tablet Take 1.5 tablets (75 mg total) by mouth daily. 45 tablet 5  . valsartan (DIOVAN) 320 MG tablet Take 1 tablet (320 mg total) by mouth daily. 90 tablet 3  . venlafaxine XR (EFFEXOR-XR) 150 MG 24 hr capsule Take 1 capsule (150 mg total) by mouth daily. 90 capsule 3   No facility-administered medications prior to visit.        Physical Exam See Research H/P    Lab Results:    BNP No results found for: BNP  ProBNP No results found for: PROBNP     PFT Results Latest Ref Rng & Units 08/23/2019  FVC-Pre L 2.61  FVC-Predicted Pre % 80  FVC-Post L 2.57  FVC-Predicted Post % 79  Pre FEV1/FVC % % 87  Post FEV1/FCV % % 92  FEV1-Pre L 2.26  FEV1-Predicted Pre % 92  FEV1-Post L 2.36  DLCO uncorrected ml/min/mmHg 13.84  DLCO UNC% % 65  DLCO corrected ml/min/mmHg 13.76  DLCO COR %Predicted % 64  DLVA Predicted % 88  TLC L 4.15  TLC % Predicted % 75  RV % Predicted % 63    No results found for: NITRICOXIDE      Assessment & Plan:   Interstitial lung disease ([place]) Continue on current regimen .   Research study patient Continue research protocol.       Rexene Edison, NP 06/24/2020

## 2020-06-24 NOTE — Assessment & Plan Note (Signed)
Continue research protocol  

## 2020-06-25 ENCOUNTER — Other Ambulatory Visit: Payer: Self-pay

## 2020-06-25 ENCOUNTER — Ambulatory Visit (INDEPENDENT_AMBULATORY_CARE_PROVIDER_SITE_OTHER)
Admission: RE | Admit: 2020-06-25 | Discharge: 2020-06-25 | Disposition: A | Discharge status: Home / Self Care | Payer: Self-pay | Source: Ambulatory Visit | Attending: Internal Medicine | Admitting: Internal Medicine

## 2020-06-25 DIAGNOSIS — I2510 Atherosclerotic heart disease of native coronary artery without angina pectoris: Secondary | ICD-10-CM | POA: Diagnosis not present

## 2020-06-25 DIAGNOSIS — Z006 Encounter for examination for normal comparison and control in clinical research program: Secondary | ICD-10-CM

## 2020-06-25 DIAGNOSIS — J84112 Idiopathic pulmonary fibrosis: Secondary | ICD-10-CM

## 2020-06-25 DIAGNOSIS — I700 Atherosclerosis of aorta: Secondary | ICD-10-CM | POA: Diagnosis not present

## 2020-06-25 DIAGNOSIS — K449 Diaphragmatic hernia without obstruction or gangrene: Secondary | ICD-10-CM | POA: Diagnosis not present

## 2020-06-30 NOTE — Progress Notes (Signed)
No change in ILD since summer 2021 but progressed since 2012. Please let patient know  Xxx  IMPRESSION: 1. No significant interval change in mild to moderate pulmonary fibrosis without clear apical to basal gradient, featuring irregular peripheral interstitial opacity, septal thickening, and minimal subpleural bronchiolectasis. No evidence of honeycombing. Fibrotic findings are not significantly changed compared to immediate prior examinations but worsened over time when compared to the lung bases on remote prior examinations dating back to 07/16/2010. Findings are consistent with wedge biopsy diagnosis of UIP/IPF. 2. Coronary artery disease.  Aortic Atherosclerosis (ICD10-I70.0).   Electronically Signed   By: Eddie Candle M.D.   On: 06/27/2020 15:28

## 2020-07-01 ENCOUNTER — Encounter: Payer: Medicare Other | Admitting: Primary Care

## 2020-07-01 ENCOUNTER — Encounter: Payer: Medicare Other | Admitting: Internal Medicine

## 2020-07-01 ENCOUNTER — Telehealth: Payer: Self-pay | Admitting: Internal Medicine

## 2020-07-09 ENCOUNTER — Telehealth: Payer: Self-pay | Admitting: Internal Medicine

## 2020-07-09 NOTE — Telephone Encounter (Signed)
Title: A randomized, double-blind, multicenter, dose-ranging, placebo-controlled Phase 2a evaluation of the safety, tolerability and pharmacokinetics of [id number] in participants with idiopathic pulmonary fibrosis (IPF)   Patients will be randomized in a 3:1 ratio to one of two treatment arms:  320 mg [id number] or placebo.  Mechanism: Selective dual aV6 and aV1 integrin inhibitor with antifibrotic activity. QQV-95638 prevents aV6 and aV1 from binding to the RGD sequence of latency-associated peptide (LAP), this blocks the release of activated TGF-1, thereby blocking the activation of pro-fibrotic pathways. Additionally, VFI-43329 has been shown to reduce collagen synthesis and gene expression in human IPF tissue.    xxxxxxxxxxxxx  As principal investigator I called the subject Susan Hunt 15-Jun-1945 To discuss the fact that she is on carvedilol which is an exclusionary criteria for the above study.  Patient is consented for the studies and is in the screening phase.  Sponsor indicated this medication is a contraindication.  In discussing with her cardiologist in the last 1-2 days, carvedilol was started because Lopressor was not controlling patient's blood pressure well.  Cardiologist /Dr Susan Hunt indicated there is no medical reason to discontinue patient of carvedilol.  Sponsor indicated that bisoprolol and metoprolol are acceptable alternatives to be on the study.  Cardiologist also indicated that if patient wanted make a change to bisoprolol or metoprolol cardiologist would be supportive of that.  However both the sponsor and his principal investigator we cannot advocate patients to change standard of care medications just because they need to be on the study.    I conveyed all the above information to the patient subject.  Clearly explained that we cannot advocate changing carvedilol into metoprolol or bisoprolol.  Also explained that any such change carries an inherent risk of losing  currently having good blood pressure control.  Explained to patient that the change would happen only if she on her own voluntary basis wants to switch the carvedilol into metoprolol or bisoprolol so she can be on the study.  Also explained to the patient that other studies will have active enrollment in the future and she could participate in that and those studies might not have the same exclusionary criteria.  Subject is going to reflect on all this.  She wants a call from her cardiologist Dr Susan Hunt and take a final decision by Monday, Jul 13, 2020.  Most likely she said she will opt out of being in the study because she is satisfied with the good blood pressure control that carvedilol is giving her.  However she will make the final decision in a few days.      SIGNATURE    Dr. Brand Males, M.D., F.C.C.P, ACRP-CPI Pulmonary and Critical Care Medicine Research Investigator, [facility] [place] Staff Physician, [place] Director - Interstitial Lung Disease  Program  [facility] and [facility] [place], [place], [address]   Pager: [phone], If no answer  OR between  19:00-7:00h: page [phone] Telephone (research): [phone]  5:16 PM 07/09/2020   5:16 PM 07/09/2020

## 2020-07-09 NOTE — Telephone Encounter (Signed)
Returned call to patient who states that she is doing a research trial with pulmonary and was told that she would need to stop her Coreg in order to do this. Patient would like to know what Dr. Margaretann Loveless thinks about this prior to stopping this medication.   Advised patient that I would forward message to Dr. Margaretann Loveless for her to review and advise.   Advised patient to call back to office with any issues, questions, or concerns. Patient verbalized understanding.

## 2020-07-15 ENCOUNTER — Encounter: Payer: Medicare Other | Admitting: Adult Health

## 2020-07-23 ENCOUNTER — Ambulatory Visit (INDEPENDENT_AMBULATORY_CARE_PROVIDER_SITE_OTHER): Payer: Medicare Other | Admitting: Internal Medicine

## 2020-07-23 ENCOUNTER — Encounter: Payer: Self-pay | Admitting: Internal Medicine

## 2020-07-23 ENCOUNTER — Other Ambulatory Visit: Payer: Self-pay

## 2020-07-23 ENCOUNTER — Other Ambulatory Visit: Payer: Self-pay | Admitting: Family Medicine

## 2020-07-23 VITALS — BP 114/62 | HR 56 | Temp 97.6°F | Ht 67.0 in | Wt 204.0 lb

## 2020-07-23 DIAGNOSIS — Z5181 Encounter for therapeutic drug level monitoring: Secondary | ICD-10-CM

## 2020-07-23 DIAGNOSIS — J84112 Idiopathic pulmonary fibrosis: Secondary | ICD-10-CM | POA: Diagnosis not present

## 2020-07-23 NOTE — Progress Notes (Signed)
OV 08/19/2019  Subjective:  Patient ID: Susan Hunt, female , DOB: 11-20-1945 , age 75 y.o. , MRN: [id number] , ADDRESS: [place] [address]   08/19/2019 -   Chief Complaint  Patient presents with  . Consult    COPD, emphysema.  sob with exertion.  dry coughs alot at night     HPI Susan Hunt 75 y.o. -she is a retired Marine scientist.  She used to work at the [employer] across [place] long and having retired from that 10 years ago and then she worked for another agency apparently having retired from all duties few years ago.  Husband is a retired Software engineer.  They are here for shortness of breath.  She tells me that she has had insidious onset of shortness of breath and cough that started 1 year ago and has been progressive.  In the same time she is also been on nitrofurantoin for times.  First 3 of these were 1 week each.  The last 1 being for at least 6 weeks ending approximately 4-6 weeks ago.  Because of progressive symptoms she was given Stiolto but this did not help.  She has been on longstanding Singulair for several years independent of these problems.  This because of allergies.  Therefore she has been referred here.  She did have a chest x-ray that on my personal visualization shows ILD changes.  It is documented below.  She did have a CT abdomen lung images that was reported as normal but I am not so sure from a year ago.  She is a previous smoker.  She is also self-referred herself to [facility] on the basis that she think she might have advanced COPD and is looking for a Zephyr valve.  Symptoms started in August 2020.   [facility] Comprehensive ILD Questionnaire  Symptoms:   SYMPTOM SCALE - ILD 08/19/2019   O2 use ra  Shortness of Breath 0 -> 5 scale with 5 being worst (score 6 If unable to do)  At rest 1  Simple tasks - showers, clothes change, eating, shaving 2  Household (dishes, doing bed, laundry) 5  Shopping 5   Walking level at own pace 2  Walking up Stairs 5  Total (30-36) Dyspnea Score 20  How bad is your cough? x  How bad is your fatigue x  How bad is nausea x  How bad is vomiting?  x  How bad is diarrhea? x  How bad is anxiety? x  How bad is depression x    She does have a cough.  She says the cough started in August 2020 the same for shortness of breath started.  She does cough at night.  She does cough when she lies down and it gets worse.  She does feel a tickle in her throat.  Cough does not affect her voice she does not bring any phlegm.  There is no wheezing.  There is no nausea no vomiting no diarrhea.    Past Medical History :   She does have a histo abdomen ry of asthma for the last few years.  Also COPD not otherwise specified for the last few years.  His acid reflux disease for the last few years.  Diabetes for the last few weeks and thyroid disease for the last few years.  Otherwise denies any collagen vascular disease or vasculitis.  Denies any sleep apnea.  Denies pulmonary hypertension.  Denies stroke denies pneumonias recurrent pneumonias.  Denies heart disease or pleurisy.   ROS: Positive for fatigue for the last several months.  Arthralgia for the last several months.  Dry eyes for the last several months.  With intermittent nausea for the last few years.  T there is daytime sleepiness for the last several months.     FAMILY HISTORY of LUNG DISEASE:  -Denies any pulmonary fibrosis.  Denies COPD denies asthma in the family.  Denies sarcoid denies cystic fibrosis denies hypersensitive pneumonitis.  Denies any autoimmune disease.   EXPOSURE HISTORY:   -She denies any Covid history.  Denies any exposure to Covid.  She did do Covid vaccine.  No reactions after the Covid vaccine.  Did smoke cigarettes 15-year 2000 and 2015 10 to 12 cigarettes a day.  Did not do any passive smoking.  Never did any marijuana.  Elevated electronic cigarettes.  Never use cocaine never used  intravenous drug use.   HOME and HOBBY DETAILS :  -[place]  Is lived there for 21 years.  Age of the home is 34 years.  In 2004 there was mold/mildew in the shower curtain.  And then there was mold under the shower.  This was torn down.  This was in 2004 but currently none.  She did some mulch back at work 2 months ago.  No humidifier use no CPAP use.  No nebulizer use.  No steam iron use.  No Jacuzzi use.  No misting Fountain in the house.  No pet birds or parakeets no gerbils.  No feather pillows.  No mold in the [place] duct.  No music habits.   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  No otehrr organic antigen exposure that she recollects (MR side note: a different patient in 2016 reported that in 2013-2016 time lot of renovations there and mold +).  Patient reported on September 10, 2019: That between 1966 and 1980 she worked at [employer] and [place].  At that time there is lot of mold exposure in the building.  The building was cleaned out.  Also checked for inorganic antigen exposure and this is negative.  Never worked in a dusty environment.  Never exposed to fumes.   PULMONARY TOXICITY HISTORY (27 items): Use nitrofurantoin in 2019 and 2020.  This is described below.  Question if you can for this there is      Simple office walk 185 feet x  3 laps goal with forehead probe 08/19/2019   O2 used ra  Number laps completed Attempted 3 but desaturated at 2  Comments about pace Normal pace  Resting Pulse Ox/HR 100% and 101/min  Final Pulse Ox/HR 86% and 116/min  Desaturated </= 88% yes  Desaturated <= 3% points Yes , 14  Got Tachycardic >/= 90/min yes  Symptoms at end of test dyspneic  Miscellaneous comments Corrected with  2L [place]    Nuclear medicine cardiac stress test March 2016: Overall Impression:  Normal stress nuclear study  with a small, mild, fixed apical defect consistent with thinning; no ischemia. LV Wall Motion:  NL LV Function; NL Wall Motion   CT abdomen lung cuts September 2020: Reported as normal.  I personally visualized and retrospect looks hazy and whether this is atelectasis or some other findings I do not know.  Jul 19, 2019:: Personally visualized shows findings  of ILD.  I am not able to see a prior chest x-ray in the system or CT scan of the chest    Results for ALLYANNA, APPLEMAN "PAM" (MRN [id number]) as of 08/19/2019 15:18  Ref. Range 03/26/2019 13:45  Creatinine Latest Ref Range: 0.40 - 1.20 mg/dL 1.05   ROS - per HPI Results for NISSI, DOFFING "PAM" (MRN [id number]) as of 08/19/2019 15:18  Ref. Range 03/26/2019 13:45  Hemoglobin Latest Ref Range: 12.0 - 15.0 g/dL 14.0     OV 09/10/2019  Subjective:  Patient ID: Susan Hunt, female , DOB: 1945-07-25 , age 23 y.o. , MRN: [id number] , ADDRESS: [place] [address]   09/10/2019 -   Chief Complaint  Patient presents with  . Follow-up    shortness of breath with exertion and non-productive cough     HPI LASHUN RAMSEYER 75 y.o. -presents for follow-up to discuss results of dyspnea work-up.  High-resolution CT chest was personally visualized.  It shows presence of interstitial lung disease.  It needs Fleischner criteria for diagnosis not consistent with UIP.  Per ATS this is alternative diagnosis to UIP.  Radiologist raised the concern of hypersensitive pneumonitis but there is no air-trapping.  The other possibilities NSIP.  She is no longer on nitrofurantoin.  In any event unit she only took it intermittently.  Overall she is stable since last visit.  She had autoimmune profile.  Is only trace positive for ANA.  Her IgE is slightly elevated.  Her PFT shows mild restriction with reduction in diffusion capacity.  We went over her exposure history again.  This time she did recollect being exposed to mold for 15 to 20  years up until 1980 while living in [place] and working at [employer].  She does not recollect mold at behavioral [facility] where she worked recently until she retired.  However another patient of mine did recollect mold in this building at [employer].   Low risk cardiac stress test in 2016 but with ejection fraction 54%.-Since then no overall change in health.  Husband inquired about the low ejection fraction.  In terms of symptoms: She is try to get portable oxygen system.  She is switching company to [employer] because of the shortage of portable oxygen systems at [facility].     OV 11/12/2019   Subjective:  Patient ID: Susan Hunt, female , DOB: [birth year], age 89 y.o. years. , MRN: [id number],  ADDRESS: [place] [address] PCP  Leamon Arnt, MD Providers : Treatment Team:  Attending Provider: Brand Males, MD Thoracic surgeon: Dr. Roxan Hockey Cardiologist Dr. Margaretann Loveless  Chief Complaint  Patient presents with  . Follow-up    SOB    Follow-up interstitial lung disease not otherwise specified   HPI BRAYLEA BRANCATO 75 y.o. -returns for follow-up.  Her husband is with her.  After her last visit she saw cardiothoracic surgery but we had to hold the surgical lung biopsy because her echocardiogram from September 13, 2019 showed a drop in left ventricular systolic ejection fraction.  Since then she is seen cardiology.  She had a heart catheterization that shows ejection action 40% still low.  Nonobstructive coronary artery disease.  She is followed up with cardiology and she has been started on beta-blocker.  On her father medications was also adjusted.  So far no change in dyspnea.  She tells me overall dyspnea is the same without any change.  Symptom scores shows a  two-point shift with worsening but she told the medical assistant that she is feeling worse.  Walking desaturation test today shows - normalcy .  She wants portable oxygen but the first DME  company [facility] did not have portable oxygen tanks.  She is found another company [employer] that is able to give her portable oxygen.  She needs a requalifying walk that is documented below.  In terms of surgical lung biopsy: She did have some prominent mediastinal nodes.  It appears Dr. Roxan Hockey has informed them that he will do a EBUS for this.  They have also been told by cardiology that from a cardiac standpoint patient would be a suitable candidate for surgical lung biopsy.  The husband wanted to know if a surgical biopsy is indicated and also my perspective and safety.  SYMPTOM SCALE - ILD 08/19/2019  11/12/2019  12/27/2019   O2 use ra ra ra  Shortness of Breath 0 -> 5 scale with 5 being worst (score 6 If unable to do)    At rest 1 1 0  Simple tasks - showers, clothes change, eating, shaving _0 Household (dishes, doing bed, laundry) _1 Shopping _2 Walking level at own pace _3 Walking up Stairs _4 Total (30-36) Dyspnea Score _5 How bad is your cough? x 2 0  How bad is your fatigue x 5 1  How bad is nausea x 1 0  How bad is vomiting?  x 1 0  How bad is diarrhea? x 0 0  How bad is anxiety? x 0 -0  How bad is depression x 0 0     Simple office walk 185 feet x  3 laps goal with forehead probe 08/19/2019  11/12/2019   O2 used ra ra  Number laps completed Attempted 3 but desaturated at 2 Did alll 3 on this repeat walk  Comments about pace Normal pace   Resting Pulse Ox/HR 100% and 101/min 96% and 60/min  Final Pulse Ox/HR 86% and 116/min 94% and 80/min  Desaturated </= 88% yes no  Desaturated <= 3% points Yes , 14 no  Got Tachycardic >/= 90/min yes no  Symptoms at end of test dyspneic mild  Miscellaneous comments Corrected with  2L [place] Will return o2   ROS - per HPI  xxxxxxxxxxxxxxxxxxxxxxxxxx Results for DRAYA, FELKER "PAM" (MRN [id number]) as of 09/10/2019 13:56  Ref. Range 08/23/2019 10:53  FVC-Pre Latest Units: L 2.61  FVC-%Pred-Pre  Latest Units: % 80  FEV1-Pre Latest Units: L 2.26  FEV1-%Pred-Pre Latest Units: % 92  Results for JESSAH, DANSER "PAM" (MRN [id number]) as of 09/10/2019 13:56  Ref. Range 08/23/2019 10:53  DLCO cor Latest Units: ml/min/mmHg 13.76  DLCO cor % pred Latest Units: % 64   Results for KEA, CALLAN "PAM" (MRN [id number]) as of 09/10/2019 13:56  Ref. Range 08/23/2019 10:53  TLC Latest Units: L 4.15  TLC % pred Latest Units: % 75   xxxxxxxxxxxxxxxxxxxxxx  CT chest high resolution June 2021 Lungs/Pleura: Mild pulmonary fibrosis in a pattern with apical predominance, featuring irregular peripheral interstitial opacity and scattered areas of subpleural bronchiolectasis. Mild, tubular bronchiectasis throughout. No significant air trapping on expiratory phase imaging. No pleural effusion or pneumothorax.  Upper Abdomen: No acute abnormality.  Musculoskeletal: No chest wall mass or suspicious bone lesions identified.  IMPRESSION: 1. Mild pulmonary fibrosis in a pattern with apical predominance, featuring irregular peripheral interstitial  opacity and scattered areas of subpleural bronchiolectasis. Mild, tubular bronchiectasis throughout. Findings are most consistent with an "alternative diagnosis" pattern by ATS pulmonary fibrosis criteria, leading differential considerations chronic fibrotic hypersensitivity pneumonitis or NSIP. 2. Prominent mediastinal lymph nodes, nonspecific and likely reactive. 3. Coronary artery disease.  Aortic Atherosclerosis (ICD10-I70.0).   Electronically Signed   By: Eddie Candle M.D.   On: 09/04/2019 15:37    xxxxxxxxxxxxxxxxxxxxxxxx[id number]  Results for NAYAH, LUKENS "PAM" (MRN [id number]) as of 09/10/2019 13:56  Ref. Range 08/19/2019 16:30 08/19/2019 16:30  SEE BELOW Unknown  Comment  Anti Nuclear Antibody (ANA) Latest Ref Range: NEGATIVE  POSITIVE (A)   ANA Pattern 1 Unknown Cytoplasmic (A)   ANA Titer 1 Unknown 1:80  (H) Negative  Angiotensin-Converting Enzyme Latest Ref Range: 9 - 67 U/L 35   Anti JO-1 Latest Ref Range: 0.0 - 0.9 AI  <0.2  CENTROMERE AB SCREEN Latest Ref Range: 0.0 - 0.9 AI  <2.9  Cyclic Citrullin Peptide Ab Latest Units: UNITS <16   dsDNA Ab Latest Ref Range: 0 - 9 IU/mL  5  ENA RNP Ab Latest Ref Range: 0.0 - 0.9 AI  0.6  ENA SSA (RO) Ab Latest Ref Range: 0.0 - 0.9 AI  <0.2  ENA SSB (LA) Ab Latest Ref Range: 0.0 - 0.9 AI  <0.2  Myeloperoxidase Abs Latest Units: AI <1.0   Serine Protease 3 Latest Units: AI <1.0   RA Latex Turbid. Latest Ref Range: <14 IU/mL <14   ENA SM Ab Ser-aCnc Latest Ref Range: 0.0 - 0.9 AI  <0.2  Chromatin Ab SerPl-aCnc Latest Ref Range: 0.0 - 0.9 AI  <0.2     has a past medical history of Asthma, Depression, Emphysema of lung ([place]), Hemorrhoids, Hyperlipidemia, Hypertension, Hypothyroidism, Obesity, Rosacea, and Tubular adenoma of colon (03/26/2019).  IMPRESSIONS   ECHO 09/13/19 1. Left ventricular ejection fraction, by estimation, is 30 to 35%. The  left ventricle has moderately decreased function. The left ventricle  demonstrates global hypokinesis. Apex poorly visualized, consider repeat  limited echo with contrast to rule out  apical thrombus. The left ventricular internal cavity size was moderately  dilated. Left ventricular diastolic parameters are consistent with Grade I  diastolic dysfunction (impaired relaxation).  2. Right ventricular systolic function is normal. The right ventricular  size is normal. Tricuspid regurgitation signal is inadequate for assessing  PA pressure.  3. The mitral valve is normal in structure. Trivial mitral valve  regurgitation.  4. The aortic valve was not well visualized. Aortic valve regurgitation  is not visualized. No aortic stenosis is present.  CATH 8?20/21  Prox LAD to Mid LAD lesion is 30% stenosed.  Ost Cx to Prox Cx lesion is 35% stenosed.  Prox RCA lesion is 35% stenosed.  There is moderate left  ventricular systolic dysfunction.  LV end diastolic pressure is normal.  The left ventricular ejection fraction is 35-45% by visual estimate.   1. Nonobstructive CAD 2. Moderate LV dysfunction. EF estimated at 40% with global hypokinesis 3. Normal LV filling pressures 4. Normal right heart pressures 5. Normal cardiac output.   Plan: medical therapy for LV dysfunction and lipid lowering therapy for nonobstructive CAD.    OV 12/27/2019  Subjective:  Patient ID: Susan Hunt, female , DOB: May 23, 1945 , age 56 y.o. , MRN: [id number] , ADDRESS: [place] [address] PCP Leamon Arnt, MD Patient Care Team: Leamon Arnt, MD as PCP - General (Family Medicine) Elroy Channel, MD as Referring  Physician (Internal Medicine) Rockey Situ as Consulting Physician (Obstetrics and Gynecology) Trinda Pascal, FNP as Consulting Physician (Urology) Brand Males, MD as Consulting Physician (Pulmonary Disease)  This Provider for this visit: Treatment Team:  Attending Provider: Brand Males, MD    12/27/2019 -   Chief Complaint  Patient presents with  . Follow-up    discuss biopsy results. SOB sometimes. denies cough or wheezing.     HPI CHANNON BROUGHER 75 y.o. --follow-up interstitial lung disease.  Had surgical biopsy December 13, 2019.  After that she is was in the hospital requiring oxygen but then got discharged without oxygen.  At this point time she is much improved but still having soreness from her chest incision site.  Husband and she are here.  They are concerned about some postoperative atelectasis that was seen.  I visualized the x-ray.  Today walking desaturation test is normal.  I reassured them about this.  But I will give her incentive spirometer.  The main issue is to discuss the biopsy results: The bronchoalveolar lavage shows mixed cellularity with neutrophilia.  The surgical lung biopsy itself shows UIP with just 1 carcinoid  tumor.  Reviewing her clinical history this UIP pattern is now consistent with IPF.  I gave her the diagnosis.  Regarding the carcinoid tumor let: I think this is an incidental finding.  There is no  DIPNECH syndrome  Her current symptom and walk profile are stable.   Results for ALZORA, HA "PAM" (MRN [id number]) as of 12/27/2019 12:06  Ref. Range 12/13/2019 08:05  Color, Fluid Latest Ref Range: YELLOW  COLORLESS (A)  Total Nucleated Cell Count, Fluid Latest Ref Range: 0 - 1,000 cu mm 18  Lymphs, Fluid Latest Units: % 25  Eos, Fluid Latest Units: % 8  Appearance, Fluid Latest Ref Range: CLEAR  HAZY (A)  Other Cells, Fluid Latest Units: % MESOTHELIAL CELLS NOTED  Neutrophil Count, Fluid Latest Ref Range: 0 - 25 % 44 (H)  Monocyte-Macrophage-Serous Fluid Latest Ref Range: 50 - 90 % 23 (L)    OV 02/04/2020   Subjective:  Patient ID: Susan Hunt, female , DOB: [birth year], age 57 y.o. years. , MRN: [id number],  ADDRESS: [place] [address] PCP  Leamon Arnt, MD Providers : Treatment Team:  Attending Provider: Brand Males, MD Patient Care Team: Leamon Arnt, MD as PCP - General (Family Medicine) Elroy Channel, MD as Referring Physician (Internal Medicine) Rockey Situ as Consulting Physician (Obstetrics and Gynecology) Trinda Pascal, FNP as Consulting Physician (Urology) Brand Males, MD as Consulting Physician (Pulmonary Disease)    Chief Complaint  Patient presents with  . Follow-up    ILD, doing well   Follow-up idiopathic pulmonary fibrosis diagnosed on surgical lung biopsy 12/13/2019.  Diagnosis formally given on 12/27/2019 Commenced pirfenidone late October 2021/early November 2021     HPI EMBERLEE SORTINO 75 y.o. -returns for follow-up with her husband.  This visit is mainly to focus on pirfenidone uptake.  She has been taking pirfenidone for 3 weeks or more.  She is currently on full dose 3 pills 3 times a  day.  The husband wants to know when we would roll to a full largest x1 pill 3 times daily.  I indicated to them that it would be 3 to 4 months of stability with pirfenidone before we made the decision.  Currently she is tolerating pirfenidone well.  She did have some mild nausea and she thinks it is  because she did not take a full meal.  Overall she is handling pirfenidone well without any problems.  No fatigue no weight loss.  Respiratory wise she is stable.  She did get in touch with a local support group.  She is asking about pulmonary rehabilitation referral and she is interested in this.  One of the main issues they wanted to discuss was the fact the pirfenidone refill in the supply chain was not consistent.  There was anxiety that they were not going to get the refill.  She had to call the office.  She believes that it is because she made the call to the office that the refill was sent.  She says she tried to call [place] without any response for a long time.  I have indicated to her that I will ask our pharmacist to review and explained the logistics.  She will have a repeat LFT today   PFT Results Latest Ref Rng & Units 08/23/2019  FVC-Pre L 2.61  FVC-Predicted Pre % 80  FVC-Post L 2.57  FVC-Predicted Post % 79  Pre FEV1/FVC % % 87  Post FEV1/FCV % % 92  FEV1-Pre L 2.26  FEV1-Predicted Pre % 92  FEV1-Post L 2.36  DLCO uncorrected ml/min/mmHg 13.84  DLCO UNC% % 65  DLCO corrected ml/min/mmHg 13.76  DLCO COR %Predicted % 64  DLVA Predicted % 88  TLC L 4.15  TLC % Predicted % 75  RV % Predicted % 63      OV 04/02/2020  Subjective:  Patient ID: Susan Hunt, female , DOB: [birth year] , age 66 y.o. , MRN: [id number] , ADDRESS: [place] [address] PCP Leamon Arnt, MD Patient Care Team: Leamon Arnt, MD as PCP - General (Family Medicine) Elroy Channel, MD as Referring Physician (Internal Medicine) Rockey Situ as Consulting Physician  (Obstetrics and Gynecology) Trinda Pascal, FNP as Consulting Physician (Urology) Brand Males, MD as Consulting Physician (Pulmonary Disease)  This Provider for this visit: Treatment Team:  Attending Provider: Brand Males, MD    04/02/2020 -   Chief Complaint  Patient presents with  . Follow-up    Pt states she has been doing well since last visit and denies any complaints.   Follow-up idiopathic pulmonary fibrosis diagnosed on surgical lung biopsy 12/13/2019.  Diagnosis formally given on 12/27/2019 Commenced pirfenidone late October 2021/early November 2021  HPI LOREL LEMBO 75 y.o. -returns for follow-up.  Presents with her husband.  She is tolerating pirfenidone fine with just very mild occasional nausea.  She wants to roll over to the 1 pill 3 times daily.  I have sent a message through secure chat to the pharmacist.  She is going to start pulmonary rehabilitation in February 2022.  We discussed lung transplantation but at this point we will hold off.  She will check liver function test today.    There are no new issues.  She is already touch base with the support group her symptom score shows stability.  Of note she has returned to portable oxygen because she feels she does not need it.  Walking desaturation test confirms that.    She is interested in clinical trials.  She is touch base with cardiology and looking at a heart failure trial there.  She is also given her name for pulmonary fibrosis trials but currently there is a backlog.  I then emailed Berneda Rose cardiology Geophysicist/field seismologist and she replied saying, "She was in our [facility]  trial which her enrollment is complete. I am not aware of any other trial we are screening her for currently but will ask the team. I think IPF trial option would be in her best interest. I also think IPF will exclude her from our HF device trials.  "   PFT  PFT Results Latest Ref Rng & Units 08/23/2019  FVC-Pre L 2.61   FVC-Predicted Pre % 80  FVC-Post L 2.57  FVC-Predicted Post % 79  Pre FEV1/FVC % % 87  Post FEV1/FCV % % 92  FEV1-Pre L 2.26  FEV1-Predicted Pre % 92  FEV1-Post L 2.36  DLCO uncorrected ml/min/mmHg 13.84  DLCO UNC% % 65  DLCO corrected ml/min/mmHg 13.76  DLCO COR %Predicted % 64  DLVA Predicted % 88  TLC L 4.15  TLC % Predicted % 75  RV % Predicted % 63    OV 07/23/2020  Subjective:  Patient ID: Susan Hunt, female , DOB: 15-Sep-1945 , age 9 y.o. , MRN: [id number] , ADDRESS: [address] [place] [address] PCP Leamon Arnt, MD Patient Care Team: Leamon Arnt, MD as PCP - General (Family Medicine) Elouise Munroe, MD as PCP - Cardiology (Cardiology) Elroy Channel, MD as Referring Physician (Internal Medicine) Rockey Situ as Consulting Physician (Obstetrics and Gynecology) Trinda Pascal, FNP as Consulting Physician (Urology) Brand Males, MD as Consulting Physician (Pulmonary Disease)  This Provider for this visit: Treatment Team:  Attending Provider: Brand Males, MD    07/23/2020 -   Chief Complaint  Patient presents with  . Follow-up    IPF/ILD, denies changes   Follow-up idiopathic pulmonary fibrosis diagnosed on surgical lung biopsy 12/13/2019.  Diagnosis formally given on 12/27/2019 Commenced pirfenidone late October 2021/early November 2021. Screen failed for Pliant Part D trial due to soc coreg intake - Spring 2022. IPF final dx on MDD Jul 21, 2020  HPI DIANCA OWENSBY 75 y.o. -returns for follow-up.  At this point in time she is tolerating pirfenidone well.  In fact no side effects.  Her symptom score is stable.  She had high-resolution CT chest as part of a research protocol and there is no progressions in September 2021.  Her walking desaturation test shows a tendency to drop.  Is unclear if it is worse or the same because she walks faster than usual.  She feels stable.  However April 2022 she had recent PFTs FVC,  2.122, 71.6%, DLCO: 11.4, 54% -> this would suggest a decline.  Of note she try to qualify for the phase 2 study CALLED  Pliant..  However she is on carvedilol for hypertension control.  Hypertensive related to the reason of her cardiomyopathy.  Therefore she is screen failed.  She has good control of blood pressure with carvedilol.  She is interested in other research trials that she might qualify for.  Her husband Lakrista Scaduto is with her today.      SYMPTOM SCALE - ILD.td  08/19/2019  11/12/2019  12/27/2019 Last Weight  Most recent update: 12/27/2019 11:48 AM    Weight  89.7 kg (197 lb 12.8 oz)            02/04/2020 196#  04/02/2020 197# 07/23/2020 204#  O2 use _0  ra  Shortness of Breath 0 -> 5 scale with 5 being worst (score 6 If unable to do)       At rest 1 1 0 0 0 1  Simple tasks - showers, clothes change,  eating, shaving _0 Household (dishes, doing bed, laundry) _1 Shopping _2 Walking level at own pace _3 0 1  Walking up Stairs _4 Total (30-36) Dyspnea Score _5 How bad is your cough? x 2 0 0 0 0  0H0o0w bad is your fatigue x _6 0  How bad is nausea x 1 0 1 1 0  How bad is vomiting?  x 1 0 0 0 0  How bad is diarrhea? x 0 0 0 0 00  How bad is anxiety? x 0 0 0 0 0  How bad is depression x 0 0 0 0 0     Simple office walk 185 feet x  3 laps goal with forehead probe 08/19/2019  11/12/2019  12/27/19 BRL 04/02/2020 gso - she has returned her portabl o2 07/23/2020   O2 used _7   Number laps completed Attempted 3 but desaturated at 2 Did alll 3 on this repeat walk - on beta blocker     Comments about pace Normal pace  Nl pace avg avg  Resting Pulse Ox/HR 100% and 101/min 96% and 60/min 94% and 71/min 100% and 60 99% and 76  Final Pulse Ox/HR 86% and 116/min 94% and 80/min 96% and 87/min 94% ns 79 90% and HR 84  Desaturated </= 88% yes no   no  Desaturated <= 3% points Yes , 14 no    Yes, 9 poins  Got Tachycardic >/= 90/min yes no   no  Symptoms at end of test dyspneic mild none noon no  Miscellaneous comments Corrected with  2L [place] Will return o2 back to dme   no        PFT  PFT Results Latest Ref Rng & Units 08/23/2019  FVC-Pre L 2.61  FVC-Predicted Pre % 80  FVC-Post L 2.57  FVC-Predicted Post % 79  Pre FEV1/FVC % % 87  Post FEV1/FCV % % 92  FEV1-Pre L 2.26  FEV1-Predicted Pre % 92  FEV1-Post L 2.36  DLCO uncorrected ml/min/mmHg 13.84  DLCO UNC% % 65  DLCO corrected ml/min/mmHg 13.76  DLCO COR %Predicted % 64  DLVA Predicted % 88  TLC L 4.15  TLC % Predicted % 75  RV % Predicted % 63   Narrative & Impression  CLINICAL DATA:  Idiopathic pulmonary fibrosis  EXAM: CT CHEST WITHOUT CONTRAST  TECHNIQUE: Multidetector CT imaging of the chest was performed following the standard protocol without intravenous contrast. High resolution imaging of the lungs, as well as inspiratory and expiratory imaging, was performed.  COMPARISON:  11/29/2019, 09/04/2019, CT abdomen pelvis, 12/06/2018 07/16/2010  FINDINGS: Cardiovascular: Aortic atherosclerosis. Normal heart size. Three-vessel coronary artery calcifications and/or stents. No pericardial effusion.  Mediastinum/Nodes: No enlarged mediastinal, hilar, or axillary lymph nodes. Small hiatal hernia. Thyroid gland, trachea, and esophagus demonstrate no significant findings.  Lungs/Pleura: No significant interval change in mild to moderate pulmonary fibrosis without clear apical to basal gradient, featuring irregular peripheral interstitial opacity, septal thickening, and minimal subpleural bronchiolectasis. No evidence of honeycombing. Fibrotic findings are not significantly changed compared to immediate prior examinations but worsened over time when compared to the lung bases on remote prior examinations dating back to 07/16/2010. No significant air trapping on expiratory phase imaging. Wedge  biopsies of the right lung. No pleural effusion  or pneumothorax.  Upper Abdomen: No acute abnormality.  Musculoskeletal: No chest wall mass or suspicious bone lesions identified.  IMPRESSION: 1. No significant interval change in mild to moderate pulmonary fibrosis without clear apical to basal gradient, featuring irregular peripheral interstitial opacity, septal thickening, and minimal subpleural bronchiolectasis. No evidence of honeycombing. Fibrotic findings are not significantly changed compared to immediate prior examinations but worsened over time when compared to the lung bases on remote prior examinations dating back to 07/16/2010. Findings are consistent with wedge biopsy diagnosis of UIP/IPF. 2. Coronary artery disease.  Aortic Atherosclerosis (ICD10-I70.0).   Electronically Signed   By: Eddie Candle M.D.   On: 06/27/2020 15:28       has a past medical history of Anemia, Asthma, CHF (congestive heart failure) ([place]), Depression, Emphysema of lung ([place]), GERD (gastroesophageal reflux disease), Hemorrhoids, History of kidney stones, Hyperlipidemia, Hypertension, Hypothyroidism, Obesity, Pre-diabetes, Rosacea, and Tubular adenoma of colon (03/26/2019).   reports that she quit smoking about 6 years ago. Her smoking use included cigarettes. She has a 7.50 pack-year smoking history. She has never used smokeless tobacco.  Past Surgical History:  Procedure Laterality Date  . ABDOMINAL HYSTERECTOMY    . ANTERIOR AND POSTERIOR VAGINAL REPAIR  2020  . CARDIAC CATHETERIZATION  11/01/2019  . CATARACT EXTRACTION W/ INTRAOCULAR LENS IMPLANT Bilateral 2016  . COLONOSCOPY    . CYSTOSCOPY    . DENTAL SURGERY  2016   Dental Implant  . EYE SURGERY Bilateral    cataract  . FRACTURE SURGERY Left 2013   Left ankle  . INTERCOSTAL NERVE BLOCK Right 12/13/2019   Procedure: INTERCOSTAL NERVE BLOCK;  Surgeon: Melrose Nakayama, MD;  Location: [place];  Service: Thoracic;   Laterality: Right;  . LUNG BIOPSY Right 12/13/2019   Procedure: LUNG BIOPSY;  Surgeon: Melrose Nakayama, MD;  Location: [place];  Service: Thoracic;  Laterality: Right;  . LYMPH NODE BIOPSY Right 12/13/2019   Procedure: LYMPH NODE BIOPSY;  Surgeon: Melrose Nakayama, MD;  Location: [place];  Service: Thoracic;  Laterality: Right;  . medtronix nerve stimulator  07/18/2019   Complete InterStim Sacral Neuromudulation system implantation 07/18/19, [facility]  . NASAL SINUS SURGERY    . RIGHT/LEFT HEART CATH AND CORONARY ANGIOGRAPHY N/A 11/01/2019   Procedure: RIGHT/LEFT HEART CATH AND CORONARY ANGIOGRAPHY;  Surgeon: Martinique, Peter M, MD;  Location: [place] [facility];  Service: Cardiovascular;  Laterality: N/A;  . VAGINECTOMY, PARTIAL    . VIDEO BRONCHOSCOPY N/A 12/13/2019   Procedure: VIDEO BRONCHOSCOPY;  Surgeon: Melrose Nakayama, MD;  Location: [facility];  Service: Thoracic;  Laterality: N/A;    Allergies  Allergen Reactions  . Ace Inhibitors Cough  . Clarithromycin     REACTION: GI sx  . Codeine Nausea Only  . Telmisartan Other (See Comments)    sleepy    Immunization History  Administered Date(s) Administered  . Fluad Quad(high Dose 65+) 12/09/2019  . Hepatitis A, Adult 04/06/2018  . Influenza Split 01/06/2009  . Influenza, High Dose Seasonal PF 12/17/2014, 12/20/2015, 01/15/2017, 12/18/2017, 11/28/2018  . Influenza, Seasonal, Injecte, Preservative Fre 01/07/2014, 12/17/2014  . Influenza,inj,Quad PF,6+ Mos 01/15/2017  . Influenza,trivalent, recombinat, inj, PF 12/03/2012  . Influenza-Unspecified 01/15/2017  . Moderna SARS-COV2 Booster Vaccination 01/07/2020  . Moderna Sars-Covid-2 Vaccination 04/17/2019, 05/15/2019  . Pneumococcal Conjugate-13 01/07/2014  . Pneumococcal Polysaccharide-23 11/23/2011  . Td 03/14/2006, 10/16/2011  . Zoster 12/06/2013  . Zoster Recombinat (Shingrix) 10/21/2017, 12/18/2017    Family History  Problem Relation Age of Onset  .  Heart disease Mother    . Hypertension Mother   . Osteoporosis Mother   . Stroke Mother   . Alcohol abuse Father   . Diabetes Maternal Grandmother   . Drug abuse Son   . Breast cancer Neg Hx      Current Outpatient Medications:  .  acetaminophen (TYLENOL) 500 MG tablet, Take 2 tablets (1,000 mg total) by mouth every 6 (six) hours as needed., Disp: 30 tablet, Rfl: 0 .  amLODipine (NORVASC) 5 MG tablet, TAKE ONE TABLET BY MOUTH DAILY, Disp: 90 tablet, Rfl: 3 .  carvedilol (COREG) 25 MG tablet, Take 1 tablet (25 mg total) by mouth 2 (two) times daily., Disp: 180 tablet, Rfl: 3 .  cephALEXin (KEFLEX) 250 MG capsule, Take 250 mg by mouth at bedtime., Disp: , Rfl:  .  cetirizine (ZYRTEC) 10 MG tablet, Take 10 mg by mouth daily., Disp: , Rfl:  .  FARXIGA 10 MG TABS tablet, Take 10 mg by mouth daily., Disp: , Rfl:  .  fluticasone (FLONASE) 50 MCG/ACT nasal spray, Place 2 sprays into both nostrils at bedtime as needed (allergies.). , Disp: , Rfl:  .  gabapentin (NEURONTIN) 300 MG capsule, Take 1 capsule (300 mg total) by mouth 2 (two) times daily., Disp: 60 capsule, Rfl: 1 .  levothyroxine (SYNTHROID) 125 MCG tablet, TAKE ONE TABLET BY MOUTH DAILY, Disp: 90 tablet, Rfl: 0 .  montelukast (SINGULAIR) 10 MG tablet, TAKE ONE TABLET BY MOUTH EVERY NIGHT AT BEDTIME, Disp: 90 tablet, Rfl: 1 .  Multiple Vitamin (MULTIVITAMIN WITH MINERALS) TABS tablet, Take 1 tablet by mouth daily., Disp: , Rfl:  .  omeprazole (PRILOSEC) 20 MG capsule, TAKE ONE CAPSULE BY MOUTH DAILY, Disp: 90 capsule, Rfl: 3 .  ondansetron (ZOFRAN-ODT) 4 MG disintegrating tablet, DISSOLVE ONE TABLET BY MOUTH EVERY 8 HOURS AS NEEDED FOR NAUSEA/ VOMITING, Disp: 20 tablet, Rfl: 0 .  Pirfenidone (ESBRIET) 801 MG TABS, Take 801 mg by mouth with breakfast, with lunch, and with evening meal., Disp: 90 tablet, Rfl: 3 .  rosuvastatin (CRESTOR) 10 MG tablet, TAKE ONE TABLET BY MOUTH DAILY, Disp: 90 tablet, Rfl: 3 .  spironolactone (ALDACTONE) 50 MG tablet, Take 1.5  tablets (75 mg total) by mouth daily., Disp: 45 tablet, Rfl: 5 .  valsartan (DIOVAN) 320 MG tablet, Take 1 tablet (320 mg total) by mouth daily., Disp: 90 tablet, Rfl: 3 .  venlafaxine XR (EFFEXOR-XR) 150 MG 24 hr capsule, Take 1 capsule (150 mg total) by mouth daily., Disp: 90 capsule, Rfl: 3      Objective:   Vitals:   07/23/20 1341  BP: 114/62  Pulse: (!) 56  Temp: 97.6 F (36.4 C)  TempSrc: Temporal  SpO2: 99%  Weight: 204 lb (92.5 kg)  Height: _0  (1.702 m)    Estimated body mass index is 31.95 kg/m as calculated from the following:   Height as of this encounter: _1  (1.702 m).   Weight as of this encounter: 204 lb (92.5 kg).  _2 @  [employer]   07/23/20 1341  Weight: 204 lb (92.5 kg)     Physical Exam   General: No distress. Looks well Neuro: Alert and Oriented x 3. GCS 15. Speech normal Psych: Pleasant Resp:  Barrel Chest - no.  Wheeze - no, Crackles - mild, No overt respiratory distress CVS: Normal heart sounds. Murmurs - no Ext: Stigmata of Connective Tissue Disease - no HEENT: Normal upper airway. PEERL +. No post nasal drip  Assessment:       ICD-10-CM   1. IPF (idiopathic pulmonary fibrosis) ([place])  J84.112   2. Encounter for therapeutic drug monitoring  Z51.81    Clinically stable although there might be mild progression over time  AST: 29U/L ALT: 17 U/L Liver function enzymes normal at research /visit in April 2022    Plan:     Patient Instructions     ICD-10-CM   1. IPF (idiopathic pulmonary fibrosis) ([place])  J84.112   2. Encounter for therapeutic drug monitoring  Z51.81     -Clinically IPF is stable -Glad you are tolerating pirfenidone well  - s/p rehab start feb 2022 - screen failed Pliant Part D phase s2 study  Plan -Continue pirfenidone/Esbriet at full dose   -Check liver function test in 3 months -Glad you are in touch with the local support group - any research study will let you  know  Follow-up -3 months do LFT blood work  - 5-6 months do spirometry and DLCO -5-6 Return to see Dr. Chase Caller in   -30-minute visit; symptom score and walk test at follow-up       SIGNATURE    Dr. Brand Males, M.D., F.C.C.P,  Pulmonary and Critical Care Medicine Staff Physician, [place] Director - Interstitial Lung Disease  Program  Pulmonary [place] [facility] [place], [place], [address]  Pager: [phone], If no answer or between  15:00h - 7:00h: call [phone] Telephone: [phone]  2:12 PM 07/23/2020

## 2020-07-23 NOTE — Patient Instructions (Addendum)
ICD-10-CM   1. IPF (idiopathic pulmonary fibrosis) ([place])  J84.112   2. Encounter for therapeutic drug monitoring  Z51.81     -Clinically IPF is stable -Glad you are tolerating pirfenidone well  - s/p rehab start feb 2022 - screen failed Pliant Part D phase s2 study  Plan -Continue pirfenidone/Esbriet at full dose   -Check liver function test in 3 months -Glad you are in touch with the local support group - any research study will let you know  Follow-up -3 months do LFT blood work  - 5-6 months do spirometry and DLCO -5-6 Return to see Dr. Chase Caller in   -30-minute visit; symptom score and walk test at follow-up

## 2020-07-27 ENCOUNTER — Other Ambulatory Visit: Payer: Self-pay | Admitting: Family Medicine

## 2020-07-27 ENCOUNTER — Encounter: Payer: Medicare Other | Admitting: Adult Health

## 2020-07-28 ENCOUNTER — Other Ambulatory Visit: Payer: Self-pay

## 2020-07-28 ENCOUNTER — Ambulatory Visit
Admission: RE | Admit: 2020-07-28 | Discharge: 2020-07-28 | Disposition: A | Discharge status: Home / Self Care | Payer: Medicare Other | Source: Ambulatory Visit | Attending: Family Medicine | Admitting: Family Medicine

## 2020-07-28 ENCOUNTER — Other Ambulatory Visit: Payer: Self-pay | Admitting: Family Medicine

## 2020-07-28 DIAGNOSIS — Z1231 Encounter for screening mammogram for malignant neoplasm of breast: Secondary | ICD-10-CM

## 2020-08-11 ENCOUNTER — Encounter: Payer: Medicare Other | Admitting: Adult Health

## 2020-08-24 ENCOUNTER — Telehealth: Payer: Self-pay | Admitting: Internal Medicine

## 2020-08-24 MED ORDER — PREDNISONE 10 MG PO TABS: ORAL_TABLET | ORAL | 0 refills | Status: DC

## 2020-08-24 NOTE — Telephone Encounter (Signed)
Spoke with the pt  She states having cough, increased SOB and fatigue for the past wk  Symptoms started after recent trip to [place], [place]  She is not producing any sputum, and she is not wheezing or having chest tightness  She checked her sats a few nights ago and they were 87%ra  She has been checking sats yesterday and today and gets readings above 90% ra  No f/c/s, aches  Has had covid vax x 4  I rec that she go ahead and take covid test- she has one at home and will test herself now   Please advise any recs, thanks   Allergies  Allergen Reactions   Ace Inhibitors Cough   Clarithromycin     REACTION: GI sx   Codeine Nausea Only   Telmisartan Other (See Comments)    sleepy

## 2020-08-24 NOTE — Telephone Encounter (Signed)
Called and spoke with patient. She verbalized understanding. While on the phone, she stated that her test result came back negative. She verbalized understanding on instructions for the doxy and prednisone taper. These will be sent to [facility] on [address].   Nothing further needed at time of call.

## 2020-08-24 NOTE — Telephone Encounter (Signed)
Pt c/o Shortness Of Breath: STAT if SOB developed within the last 24 hours or pt is noticeably SOB on the phone  1. Are you currently SOB (can you hear that pt is SOB on the phone)? Yes   2. How long have you been experiencing SOB? She stated she started about 5 days ago.    3. Are you SOB when sitting or when up moving around? Both   4. Are you currently experiencing any other symptoms? Coughing  Best number [phone]

## 2020-08-24 NOTE — Telephone Encounter (Signed)
Pt made aware of Dr. Delphina Cahill recommendations and verbalized understanding.   Dr. Delphina Cahill note: I have reviewed Dr. Golden Pop recommendations. I think if there is no sign of weight gain or increased fluid (leg swelling), then this is most likely pulmonary in origin. I would follow his recommendations and if concerns remain, we'd be happy to see her and try to help further.

## 2020-08-24 NOTE — Telephone Encounter (Signed)
Because she has IPF she is at high risk for flare up  IF home antigent positive - lmk and we have to call in paxlovid  If home antigen for covid negative - then needs to go for PCR  Meanwhile she should do  Take prednisone 40 mg daily x 2 days, then 19m daily x 2 days, then 191mdaily x 2 days, then 65m56maily x 2 days and stop  Take doxycycline 100m67m twice daily x 5 days; take after meals and avoid sunlight      Allergies  Allergen Reactions   Ace Inhibitors Cough   Clarithromycin     REACTION: GI sx   Codeine Nausea Only   Telmisartan Other (See Comments)    sleepy      Current Outpatient Medications:    acetaminophen (TYLENOL) 500 MG tablet, Take 2 tablets (1,000 mg total) by mouth every 6 (six) hours as needed., Disp: 30 tablet, Rfl: 0   amLODipine (NORVASC) 5 MG tablet, TAKE ONE TABLET BY MOUTH DAILY, Disp: 90 tablet, Rfl: 3   carvedilol (COREG) 25 MG tablet, Take 1 tablet (25 mg total) by mouth 2 (two) times daily., Disp: 180 tablet, Rfl: 3   cephALEXin (KEFLEX) 250 MG capsule, Take 250 mg by mouth at bedtime., Disp: , Rfl:    cetirizine (ZYRTEC) 10 MG tablet, Take 10 mg by mouth daily., Disp: , Rfl:    FARXIGA 10 MG TABS tablet, Take 10 mg by mouth daily., Disp: , Rfl:    fluticasone (FLONASE) 50 MCG/ACT nasal spray, Place 2 sprays into both nostrils at bedtime as needed (allergies.). , Disp: , Rfl:    gabapentin (NEURONTIN) 300 MG capsule, Take 1 capsule (300 mg total) by mouth 2 (two) times daily., Disp: 60 capsule, Rfl: 1   levothyroxine (SYNTHROID) 125 MCG tablet, TAKE ONE TABLET BY MOUTH DAILY, Disp: 90 tablet, Rfl: 0   montelukast (SINGULAIR) 10 MG tablet, TAKE ONE TABLET BY MOUTH EVERY NIGHT AT BEDTIME, Disp: 90 tablet, Rfl: 1   Multiple Vitamin (MULTIVITAMIN WITH MINERALS) TABS tablet, Take 1 tablet by mouth daily., Disp: , Rfl:    omeprazole (PRILOSEC) 20 MG capsule, TAKE ONE CAPSULE BY MOUTH DAILY, Disp: 90 capsule, Rfl: 3   ondansetron (ZOFRAN-ODT) 4 MG  disintegrating tablet, DISSOLVE ONE TABLET BY MOUTH EVERY 8 HOURS AS NEEDED FOR NAUSEA/ VOMITING, Disp: 20 tablet, Rfl: 0   Pirfenidone (ESBRIET) 801 MG TABS, Take 801 mg by mouth with breakfast, with lunch, and with evening meal., Disp: 90 tablet, Rfl: 3   rosuvastatin (CRESTOR) 10 MG tablet, TAKE ONE TABLET BY MOUTH DAILY, Disp: 90 tablet, Rfl: 3   spironolactone (ALDACTONE) 50 MG tablet, Take 1.5 tablets (75 mg total) by mouth daily., Disp: 45 tablet, Rfl: 5   valsartan (DIOVAN) 320 MG tablet, Take 1 tablet (320 mg total) by mouth daily., Disp: 90 tablet, Rfl: 3   venlafaxine XR (EFFEXOR-XR) 150 MG 24 hr capsule, Take 1 capsule (150 mg total) by mouth daily., Disp: 90 capsule, Rfl: 3

## 2020-08-24 NOTE — Telephone Encounter (Signed)
Tried calling the pt and there was no answer- LMTCB.  

## 2020-08-24 NOTE — Telephone Encounter (Signed)
Spoke with pt who report for the past 5 days she's been experiencing SOB with exertion and coughing a lot. She denies swelling and state current O2 stat is 96%. However, 3 days ago pt state she was really SOB while laying in bed and O2 was 87 %. Pt state she recently just came back from a weekend trip in [place] and felt fine while they were shopping.   Pt state she has a home test and will test  for COVID once we disconnect the line. Pt also state she has reach out to her pulmonologist and currently waiting to hear back.   Will forward to MD to make aware.

## 2020-08-25 DIAGNOSIS — L821 Other seborrheic keratosis: Secondary | ICD-10-CM | POA: Diagnosis not present

## 2020-08-25 DIAGNOSIS — L814 Other melanin hyperpigmentation: Secondary | ICD-10-CM | POA: Diagnosis not present

## 2020-08-25 DIAGNOSIS — L820 Inflamed seborrheic keratosis: Secondary | ICD-10-CM | POA: Diagnosis not present

## 2020-08-25 DIAGNOSIS — L570 Actinic keratosis: Secondary | ICD-10-CM | POA: Diagnosis not present

## 2020-08-25 MED ORDER — DOXYCYCLINE HYCLATE 100 MG PO TABS: 100.0000 mg | ORAL_TABLET | Freq: Two times a day (BID) | ORAL | 0 refills | Status: AC

## 2020-08-25 NOTE — Addendum Note (Signed)
Addended by: Vanessa Barbara on: 08/25/2020 09:53 AM   Modules accepted: Orders

## 2020-09-01 NOTE — Telephone Encounter (Signed)
Called patient due to nature of message. She did not answer. Left message for patient to call back.

## 2020-09-03 ENCOUNTER — Telehealth: Payer: Self-pay | Admitting: Internal Medicine

## 2020-09-03 NOTE — Telephone Encounter (Signed)
ATC x1, no answer, left message to return call.

## 2020-09-03 NOTE — Telephone Encounter (Signed)
Can she come next week and see one of the nurse practitioners when I am on night shift. ?  She will need ILD score and simple walking desaturation test

## 2020-09-03 NOTE — Telephone Encounter (Signed)
Spoke to patient, who is requesting appt with MR for increased sob. Cough resolved with recent course of prednisone and abx.  Denied fever, chills, sweats or additional sx. She does not a rescue inhaler.  She is taking Esbriet TID.  MR, please advise. Thanks

## 2020-09-03 NOTE — Telephone Encounter (Signed)
Pt sent in MyChart;  "Shortness of breath" and is requesting an appt w/ Dr. Chase Caller it does appear that the pt has been contacting our office lately due to similar complaints that appeared to have been resolved but must have came back.    Pls regard; [phone]

## 2020-09-04 NOTE — Telephone Encounter (Signed)
Called and spoke with Patient. Dr. Golden Pop recommendations given.  Understanding stated.  Patient scheduled first available with Beth, NP 09/17/20.  Patient advise to call if needed. Nothing further at this time.

## 2020-09-17 ENCOUNTER — Other Ambulatory Visit: Payer: Self-pay

## 2020-09-17 ENCOUNTER — Encounter: Payer: Self-pay | Admitting: Primary Care

## 2020-09-17 ENCOUNTER — Ambulatory Visit (INDEPENDENT_AMBULATORY_CARE_PROVIDER_SITE_OTHER): Payer: Medicare Other

## 2020-09-17 ENCOUNTER — Ambulatory Visit: Payer: Medicare Other | Admitting: Primary Care

## 2020-09-17 VITALS — BP 138/68 | HR 78 | Temp 97.8°F | Resp 18 | Ht 67.0 in | Wt 208.8 lb

## 2020-09-17 DIAGNOSIS — J849 Interstitial pulmonary disease, unspecified: Secondary | ICD-10-CM | POA: Diagnosis not present

## 2020-09-17 DIAGNOSIS — R0602 Shortness of breath: Secondary | ICD-10-CM

## 2020-09-17 DIAGNOSIS — I517 Cardiomegaly: Secondary | ICD-10-CM | POA: Diagnosis not present

## 2020-09-17 DIAGNOSIS — Z5181 Encounter for therapeutic drug level monitoring: Secondary | ICD-10-CM

## 2020-09-17 DIAGNOSIS — J84112 Idiopathic pulmonary fibrosis: Secondary | ICD-10-CM

## 2020-09-17 LAB — COMPREHENSIVE METABOLIC PANEL
ALT: 14 U/L (ref 0–35)
AST: 19 U/L (ref 0–37)
Albumin: 4.3 g/dL (ref 3.5–5.2)
Alkaline Phosphatase: 67 U/L (ref 39–117)
BUN: 18 mg/dL (ref 6–23)
CO2: 27 mEq/L (ref 19–32)
Calcium: 9.6 mg/dL (ref 8.4–10.5)
Chloride: 103 mEq/L (ref 96–112)
Creatinine, Ser: 1.18 mg/dL (ref 0.40–1.20)
GFR: 45.05 mL/min — ABNORMAL LOW (ref >60.00)
Glucose, Bld: 129 mg/dL — ABNORMAL HIGH (ref 70–99)
Potassium: 4.1 mEq/L (ref 3.5–5.1)
Sodium: 139 mEq/L (ref 135–145)
Total Bilirubin: 0.5 mg/dL (ref 0.2–1.2)
Total Protein: 7.3 g/dL (ref 6.0–8.3)

## 2020-09-17 LAB — HEPATIC FUNCTION PANEL
ALT: 14 U/L (ref 0–35)
AST: 19 U/L (ref 0–37)
Albumin: 4.3 g/dL (ref 3.5–5.2)
Alkaline Phosphatase: 67 U/L (ref 39–117)
Bilirubin, Direct: 0.1 mg/dL (ref 0.0–0.3)
Total Bilirubin: 0.5 mg/dL (ref 0.2–1.2)
Total Protein: 7.3 g/dL (ref 6.0–8.3)

## 2020-09-17 LAB — BRAIN NATRIURETIC PEPTIDE: Pro B Natriuretic peptide (BNP): 39 pg/mL (ref 0.0–100.0)

## 2020-09-17 NOTE — Assessment & Plan Note (Addendum)
-  Patient reports increased shortness of breath with cough x 1 month. HRCT imaging in April 2022 showed no significant interval change in mild to moderate pulmonary fibrosis. Cough improved with course of Doxycycline and prednisone but returned 2 weeks after completing. Her cough is dry without significant mucus production. ILD score is similar to previous. Oxygen did not desaturation on simple walk test today. We will check CXR and BNP/CMET today to ensure there is no underlying PNA or evidence of acute heart failure. If testing comes back normal will send in longer prednisone taper for ILD exacerbation. No medication changed today. Continue Esbriet TID as directed.

## 2020-09-17 NOTE — Progress Notes (Signed)
_0  ID: Susan Hunt, female    DOB: 09/07/1945, 75 y.o.   MRN: [id number]  Chief Complaint  Patient presents with   Follow-up    Not feeling better after prednisone and antibiotics    Referring provider: Leamon Arnt, MD  HPI: 75 year old female, former smoker. PMH significant for ILD, panlobular emphysema, HTN, heart failure with reduced EF, type 2 DM. Patient of Dr. Chase Caller, last seen on 07/23/20.   09/17/2020- Interim hx  Patient presents today for acute visit. She called our office on 09/03/20 with reports of shortness of breath. Cough resolved had resolved for a week or two with recent course of prednisone and abx. She is taking Esbriet TID. Cough is dry. She has associated shortness of breath. She feels Esbriet medication has caused her to gain weight. No leg or abdominal swelling. She got her fourth covid vaccine 1 month ago.     [facility] Comprehensive ILD Questionnaire    SYMPTOM SCALE - ILD 08/19/2019   09/17/2020   O2 use ra   Shortness of Breath 0 -> 5 scale with 5 being worst (score 6 If unable to do)   At rest 1 1  Simple tasks - showers, clothes change, eating, shaving 2 3  Household (dishes, doing bed, laundry) 5 4  Shopping 5 5  Walking level at own pace 2 3  Walking up Stairs 5 5  Total (30-36) Dyspnea Score 20 20  How bad is your cough? x 1  How bad is your fatigue x 2  How bad is nausea x 0  How bad is vomiting?   x 0  How bad is diarrhea? x 0  How bad is anxiety? x 0  How bad is depression x 0    Allergies  Allergen Reactions   Ace Inhibitors Cough   Clarithromycin     REACTION: GI sx   Codeine Nausea Only   Telmisartan Other (See Comments)    sleepy    Immunization History  Administered Date(s) Administered   Fluad Quad(high Dose 65+) 12/09/2019   Hepatitis A, Adult 04/06/2018   Influenza Split 01/06/2009   Influenza, High Dose Seasonal PF 12/17/2014, 12/20/2015, 01/15/2017, 12/18/2017, 11/28/2018   Influenza,  Seasonal, Injecte, Preservative Fre 01/07/2014, 12/17/2014   Influenza,inj,Quad PF,6+ Mos 01/15/2017   Influenza,trivalent, recombinat, inj, PF 12/03/2012   Influenza-Unspecified 01/15/2017   Moderna SARS-COV2 Booster Vaccination 01/07/2020   Moderna Sars-Covid-2 Vaccination 04/17/2019, 05/15/2019   Pneumococcal Conjugate-13 01/07/2014   Pneumococcal Polysaccharide-23 11/23/2011   Td 03/14/2006, 10/16/2011   Zoster Recombinat (Shingrix) 10/21/2017, 12/18/2017   Zoster, Live 12/06/2013    Past Medical History:  Diagnosis Date   Anemia    Asthma    "I dont think I have it.   CHF (congestive heart failure) (HCC)    Depression    Emphysema of lung (HCC)    GERD (gastroesophageal reflux disease)    Hemorrhoids    History of kidney stones    Cystoscopy   Hyperlipidemia    Hypertension    Hypothyroidism    Obesity    Pre-diabetes    Rosacea    Tubular adenoma of colon 03/26/2019   Colonoscopy repeat in 3 years    Tobacco History: Social History   Tobacco Use  Smoking Status Former   Packs/day: 0.50   Years: 15.00   Pack years: 7.50   Types: Cigarettes   Quit date: 08/18/2013   Years since quitting: 7.0  Smokeless Tobacco Never   Counseling  given: Not Answered   Outpatient Medications Prior to Visit  Medication Sig Dispense Refill   acetaminophen (TYLENOL) 500 MG tablet Take 2 tablets (1,000 mg total) by mouth every 6 (six) hours as needed. 30 tablet 0   amLODipine (NORVASC) 5 MG tablet TAKE ONE TABLET BY MOUTH DAILY 90 tablet 3   carvedilol (COREG) 25 MG tablet Take 1 tablet (25 mg total) by mouth 2 (two) times daily. 180 tablet 3   cephALEXin (KEFLEX) 250 MG capsule Take 250 mg by mouth at bedtime.     cetirizine (ZYRTEC) 10 MG tablet Take 10 mg by mouth daily.     FARXIGA 10 MG TABS tablet Take 10 mg by mouth daily.     fluticasone (FLONASE) 50 MCG/ACT nasal spray Place 2 sprays into both nostrils at bedtime as needed (allergies.).      gabapentin (NEURONTIN) 300  MG capsule Take 1 capsule (300 mg total) by mouth 2 (two) times daily. 60 capsule 1   levothyroxine (SYNTHROID) 125 MCG tablet TAKE ONE TABLET BY MOUTH DAILY 90 tablet 0   montelukast (SINGULAIR) 10 MG tablet TAKE ONE TABLET BY MOUTH EVERY NIGHT AT BEDTIME 90 tablet 1   Multiple Vitamin (MULTIVITAMIN WITH MINERALS) TABS tablet Take 1 tablet by mouth daily.     omeprazole (PRILOSEC) 20 MG capsule TAKE ONE CAPSULE BY MOUTH DAILY 90 capsule 3   ondansetron (ZOFRAN-ODT) 4 MG disintegrating tablet DISSOLVE ONE TABLET BY MOUTH EVERY 8 HOURS AS NEEDED FOR NAUSEA/ VOMITING 20 tablet 0   Pirfenidone (ESBRIET) 801 MG TABS Take 801 mg by mouth with breakfast, with lunch, and with evening meal. 90 tablet 3   predniSONE (DELTASONE) 10 MG tablet Take 4 tabs x2 days, 2 tabs x2 days, 1 tab x2 days, 1/2 tab x2 days then STOP. 15 tablet 0   rosuvastatin (CRESTOR) 10 MG tablet TAKE ONE TABLET BY MOUTH DAILY 90 tablet 3   spironolactone (ALDACTONE) 50 MG tablet Take 1.5 tablets (75 mg total) by mouth daily. 45 tablet 5   valsartan (DIOVAN) 320 MG tablet Take 1 tablet (320 mg total) by mouth daily. 90 tablet 3   venlafaxine XR (EFFEXOR-XR) 150 MG 24 hr capsule Take 1 capsule (150 mg total) by mouth daily. 90 capsule 3   No facility-administered medications prior to visit.    Review of Systems  Review of Systems  Constitutional: Negative.   Respiratory:  Positive for cough and shortness of breath. Negative for chest tightness and wheezing.     Physical Exam  BP 138/68 (BP Location: Left Arm, Patient Position: Sitting, Cuff Size: Normal)   Pulse 78   Temp 97.8 F (36.6 C)   Resp 18   Ht _0  (1.702 m)   Wt 208 lb 12.8 oz (94.7 kg)   SpO2 90%   BMI 32.70 kg/m  Physical Exam Constitutional:      Appearance: Normal appearance.  HENT:     Head: Normocephalic and atraumatic.  Cardiovascular:     Rate and Rhythm: Normal rate and regular rhythm.  Pulmonary:     Breath sounds: Rales present.   Neurological:     General: No focal deficit present.     Mental Status: She is alert and oriented to person, place, and time. Mental status is at baseline.  Psychiatric:        Mood and Affect: Mood normal.        Behavior: Behavior normal.        Thought Content: Thought content normal.  Judgment: Judgment normal.     Lab Results:  CBC    Component Value Date/Time   WBC 10.8 (H) 06/10/2020 1519   RBC 4.57 06/10/2020 1519   HGB 14.0 06/10/2020 1519   HGB 14.1 10/28/2019 1449   HCT 42.3 06/10/2020 1519   HCT 42.9 10/28/2019 1449   PLT 262.0 06/10/2020 1519   PLT 353 10/28/2019 1449   MCV 92.7 06/10/2020 1519   MCV 84 10/28/2019 1449   MCH 27.2 12/16/2019 1203   MCHC 33.1 06/10/2020 1519   RDW 14.4 06/10/2020 1519   RDW 12.9 10/28/2019 1449   LYMPHSABS 2.3 06/10/2020 1519   MONOABS 0.9 06/10/2020 1519   EOSABS 0.5 06/10/2020 1519   BASOSABS 0.1 06/10/2020 1519    BMET    Component Value Date/Time   NA 140 06/10/2020 1519   NA 139 03/26/2020 1243   K 4.1 06/10/2020 1519   CL 106 06/10/2020 1519   CO2 25 06/10/2020 1519   GLUCOSE 163 (H) 06/10/2020 1519   BUN 19 06/10/2020 1519   BUN 15 03/26/2020 1243   CREATININE 0.99 06/10/2020 1519   CREATININE 1.04 (H) 12/09/2019 1645   CALCIUM 9.4 06/10/2020 1519   GFRNONAA 49 (L) 03/26/2020 1243   GFRNONAA 53 (L) 12/09/2019 1645   GFRAA 56 (L) 03/26/2020 1243   GFRAA 61 12/09/2019 1645    BNP No results found for: BNP  ProBNP No results found for: PROBNP  Imaging: No results found.   Assessment & Plan:   Interstitial lung disease ([place]) - Patient reports increased shortness of breath with cough x 1 month. Cough improved with course of Doxycycline and prednisone but returned 2 weeks after completing. Her cough is dry without significant mucus production. ILD score is similar to previous. Oxygen did not desaturation on simple walk test today. We will check CXR and BNP/CMET today to ensure there is no  underlying PNA or evidence of acute heart failure. If testing comes back normal will send in longer prednisone taper for ILD exacerbation. No medication changed today. Continue Esbriet TID.    Martyn Ehrich, NP 09/17/2020

## 2020-09-17 NOTE — Patient Instructions (Addendum)
Nice meeting you today Susan Hunt  Recommendations: No changes today Checking CXR and labs- ensure there is no underlying pneumonia or evidence of heart failure We will likely put you back on a slower prednisone taper   Orders: Labs and CXR Simple walk test to check for O2 desaturations   Follow-up: October with Dr. Chase Caller (recall should be in, please check)

## 2020-09-17 NOTE — Progress Notes (Signed)
Please let patient know there is no evidence of PNA on CXR. Chronic interstitial changes noted which are similar to prior exam. No acute abnormality. Awaiting lab results. We will contact her tomorrow with those and plan

## 2020-09-17 NOTE — Progress Notes (Signed)
BNP and CMET were normal. No evidence of PNA or acute heart failure. Please send in prednisone taper ( 69m x 1 week; 183mx 1 week; 66m50m 1 week)

## 2020-09-18 DIAGNOSIS — J849 Interstitial pulmonary disease, unspecified: Secondary | ICD-10-CM

## 2020-09-18 MED ORDER — PIRFENIDONE 801 MG PO TABS: 801.0000 mg | ORAL_TABLET | Freq: Three times a day (TID) | ORAL | 11 refills | Status: DC

## 2020-09-18 MED ORDER — PREDNISONE 10 MG PO TABS: ORAL_TABLET | ORAL | 0 refills | Status: AC

## 2020-09-28 ENCOUNTER — Encounter: Payer: Self-pay | Admitting: Internal Medicine

## 2020-09-28 NOTE — Telephone Encounter (Signed)
Per [employer] message that pt sent to scheduling pool:   18 lbs inlast 6 months   I think due to medication. 4 lbs in last week     Susan Hunt  Susan Hunt, Susan "Susan Hunt" 57 minutes ago (2:56 PM)   Do Susan Hunt know or have a log of how much weight Susan Hunt have weighted Susan Hunt have gained and in what time span?       Susan Hunt"  Patient Appointment Schedule Request Pool 1 hour ago (2:49 PM)   I have been to pulmonary dr. Twice and am still on prednisone.  SOB is when up mobing around but 02 stats are not falling below 94.  Cough is dry and persistent to the point of making throst sore.  Also have gained weight.     Susan Hunt  Susan Jew "Susan Hunt" 2 hours ago (12:57 PM)   Good Morning Susan Hunt. Can Susan Hunt give me a little more info about your SOB?     1. Are Susan Hunt currently SOB ?   2. How long have Susan Hunt been experiencing SOB?   3. Are Susan Hunt SOB when sitting or when up moving around?   4. Are Susan Hunt currently experiencing any other symptoms?

## 2020-09-28 NOTE — Telephone Encounter (Signed)
error 

## 2020-09-29 NOTE — Telephone Encounter (Signed)
Doesn't look like any medications would be the cause, need to forward to MD

## 2020-09-29 NOTE — Telephone Encounter (Signed)
Please see MyChart encounter. Attempted to call patient. Left message for patient to call back to office when able.

## 2020-09-30 NOTE — Telephone Encounter (Signed)
Left message for pt to call

## 2020-10-02 NOTE — Telephone Encounter (Signed)
Left message to call back Reminded of appt on 7/28

## 2020-10-06 DIAGNOSIS — S8264XA Nondisplaced fracture of lateral malleolus of right fibula, initial encounter for closed fracture: Secondary | ICD-10-CM | POA: Diagnosis not present

## 2020-10-08 ENCOUNTER — Encounter: Payer: Self-pay | Admitting: Internal Medicine

## 2020-10-08 ENCOUNTER — Ambulatory Visit: Payer: Medicare Other | Admitting: Internal Medicine

## 2020-10-08 ENCOUNTER — Other Ambulatory Visit: Payer: Self-pay

## 2020-10-08 VITALS — BP 106/70 | HR 73 | Ht 67.0 in | Wt 207.0 lb

## 2020-10-08 DIAGNOSIS — E782 Mixed hyperlipidemia: Secondary | ICD-10-CM

## 2020-10-08 DIAGNOSIS — Z79899 Other long term (current) drug therapy: Secondary | ICD-10-CM | POA: Diagnosis not present

## 2020-10-08 DIAGNOSIS — I10 Essential (primary) hypertension: Secondary | ICD-10-CM

## 2020-10-08 DIAGNOSIS — I5041 Acute combined systolic (congestive) and diastolic (congestive) heart failure: Secondary | ICD-10-CM

## 2020-10-08 DIAGNOSIS — I428 Other cardiomyopathies: Secondary | ICD-10-CM | POA: Diagnosis not present

## 2020-10-08 DIAGNOSIS — I5020 Unspecified systolic (congestive) heart failure: Secondary | ICD-10-CM

## 2020-10-08 DIAGNOSIS — E119 Type 2 diabetes mellitus without complications: Secondary | ICD-10-CM | POA: Diagnosis not present

## 2020-10-08 MED ORDER — FUROSEMIDE 40 MG PO TABS: 40.0000 mg | ORAL_TABLET | Freq: Two times a day (BID) | ORAL | 0 refills | Status: DC

## 2020-10-08 NOTE — Progress Notes (Signed)
Cardiology Office Note:    Date:  10/08/2020   ID:  Susan Hunt, DOB Feb 10, 1946, MRN [id number]  PCP:  Leamon Arnt, MD  Cardiologist:  Elouise Munroe, MD  Electrophysiologist:  None   Referring MD: Leamon Arnt, MD   Chief Complaint/Reason for Referral: NICM  History of Present Illness:    Susan Hunt is a 75 y.o. female with a history of nonischemic cardiomyopathy with normal right and left heart pressures, hypertension, hyperlipidemia, interstitial lung disease with pulmonary fibrosis. Last visit 03/17/20.   Heart Failure Therapy currently ACE-I/ARB/ARNI: Valsartan 320 mg daily BB: carvedilol 25 mg BID, transitioned from toprol xl for better BP control. MRA:  spironolactone 75 mg daily SGLT2I: farxiga 10 mg daily   Also on amlodipine 5 mg daily for HTN.    Previously tried:  Entresto 49/51 mg (dry/itchy mouth, dry eyes) Lisinopril-hctz (SOB, coughing) Metoprolol tartrate (SOB, coughing) Clonidine  Today, she is accompanied by her husband.   She reports she slipped down the stairs while it was raining and sustained a right foot injury. She has a dry, hacking cough and reports feeling tired and always wants to sleep. She mentions she has gained 18 pounds in three months and the weight gain is simultaneous with the start of the cough. She was prescribed 10 mg prednisone as well given history of lung disease. She is still sleeping well despite the medication, not experiencing insomnia. She reports getting up 1-2x in the night to urinate. Her husband mentions she is often thirsty.   She reports shortness of breath and says she cannot walk outside without experiencing shortness of breath. She now feels that her symptoms are worsening and may be cardiac in nature.   Snores occasionally. She does not have sleep apnea.   She denies chest pain, palpitations, lightheadedness, headaches, syncope, orthopnea, PND.   Past Medical History:  Diagnosis Date   Anemia     Asthma    "I dont think I have it.   CHF (congestive heart failure) (HCC)    Depression    Emphysema of lung (HCC)    GERD (gastroesophageal reflux disease)    Hemorrhoids    History of kidney stones    Cystoscopy   Hyperlipidemia    Hypertension    Hypothyroidism    Obesity    Pre-diabetes    Rosacea    Tubular adenoma of colon 03/26/2019   Colonoscopy repeat in 3 years    Past Surgical History:  Procedure Laterality Date   ABDOMINAL HYSTERECTOMY     ANTERIOR AND POSTERIOR VAGINAL REPAIR  2020   CARDIAC CATHETERIZATION  11/01/2019   CATARACT EXTRACTION W/ INTRAOCULAR LENS IMPLANT Bilateral 2016   COLONOSCOPY     CYSTOSCOPY     DENTAL SURGERY  2016   Dental Implant   EYE SURGERY Bilateral    cataract   FRACTURE SURGERY Left 2013   Left ankle   INTERCOSTAL NERVE BLOCK Right 12/13/2019   Procedure: INTERCOSTAL NERVE BLOCK;  Surgeon: Melrose Nakayama, MD;  Location: [place];  Service: Thoracic;  Laterality: Right;   LUNG BIOPSY Right 12/13/2019   Procedure: LUNG BIOPSY;  Surgeon: Melrose Nakayama, MD;  Location: [place];  Service: Thoracic;  Laterality: Right;   LYMPH NODE BIOPSY Right 12/13/2019   Procedure: LYMPH NODE BIOPSY;  Surgeon: Melrose Nakayama, MD;  Location: [place];  Service: Thoracic;  Laterality: Right;   medtronix nerve stimulator  07/18/2019   Complete InterStim Sacral Neuromudulation system  implantation 07/18/19, [facility]   NASAL SINUS SURGERY     RIGHT/LEFT HEART CATH AND CORONARY ANGIOGRAPHY N/A 11/01/2019   Procedure: RIGHT/LEFT HEART CATH AND CORONARY ANGIOGRAPHY;  Surgeon: Martinique, Peter M, MD;  Location: [facility];  Service: Cardiovascular;  Laterality: N/A;   VAGINECTOMY, PARTIAL     VIDEO BRONCHOSCOPY N/A 12/13/2019   Procedure: VIDEO BRONCHOSCOPY;  Surgeon: Melrose Nakayama, MD;  Location: [facility];  Service: Thoracic;  Laterality: N/A;    Current Medications: Current Meds  Medication Sig   acetaminophen (TYLENOL) 500 MG tablet  Take 2 tablets (1,000 mg total) by mouth every 6 (six) hours as needed.   amLODipine (NORVASC) 5 MG tablet TAKE ONE TABLET BY MOUTH DAILY   carvedilol (COREG) 25 MG tablet Take 1 tablet (25 mg total) by mouth 2 (two) times daily.   cephALEXin (KEFLEX) 250 MG capsule Take 250 mg by mouth at bedtime.   cetirizine (ZYRTEC) 10 MG tablet Take 10 mg by mouth daily.   FARXIGA 10 MG TABS tablet Take 10 mg by mouth daily.   fluticasone (FLONASE) 50 MCG/ACT nasal spray Place 2 sprays into both nostrils at bedtime as needed (allergies.).    furosemide (LASIX) 40 MG tablet Take 1 tablet (40 mg total) by mouth 2 (two) times daily.   gabapentin (NEURONTIN) 300 MG capsule Take 1 capsule (300 mg total) by mouth 2 (two) times daily.   montelukast (SINGULAIR) 10 MG tablet TAKE ONE TABLET BY MOUTH EVERY NIGHT AT BEDTIME   Multiple Vitamin (MULTIVITAMIN WITH MINERALS) TABS tablet Take 1 tablet by mouth daily.   omeprazole (PRILOSEC) 20 MG capsule TAKE ONE CAPSULE BY MOUTH DAILY   ondansetron (ZOFRAN-ODT) 4 MG disintegrating tablet DISSOLVE ONE TABLET BY MOUTH EVERY 8 HOURS AS NEEDED FOR NAUSEA/ VOMITING   Pirfenidone (ESBRIET) 801 MG TABS Take 801 mg by mouth with breakfast, with lunch, and with evening meal.   [EXPIRED] predniSONE (DELTASONE) 10 MG tablet Take 2 tablets (20 mg total) by mouth daily with breakfast for 7 days, THEN 1 tablet (10 mg total) daily with breakfast for 7 days, THEN 0.5 tablets (5 mg total) daily with breakfast for 7 days.   rosuvastatin (CRESTOR) 10 MG tablet TAKE ONE TABLET BY MOUTH DAILY   spironolactone (ALDACTONE) 50 MG tablet Take 1.5 tablets (75 mg total) by mouth daily.   valsartan (DIOVAN) 320 MG tablet Take 1 tablet (320 mg total) by mouth daily.   venlafaxine XR (EFFEXOR-XR) 150 MG 24 hr capsule Take 1 capsule (150 mg total) by mouth daily.   [DISCONTINUED] levothyroxine (SYNTHROID) 125 MCG tablet TAKE ONE TABLET BY MOUTH DAILY     Allergies:   Ace inhibitors, Clarithromycin,  Codeine, and Telmisartan   Social History   Tobacco Use   Smoking status: Former    Packs/day: 0.50    Years: 15.00    Pack years: 7.50    Types: Cigarettes    Quit date: 08/18/2013    Years since quitting: 7.1   Smokeless tobacco: Never  Vaping Use   Vaping Use: Never used  Substance Use Topics   Alcohol use: Not Currently    Alcohol/week: 1.0 standard drink    Types: 1 Glasses of wine per week    Comment: 2-3 glasses of wine weekly   Drug use: No     Family History: The patient's family history includes Alcohol abuse in her father; Diabetes in her maternal grandmother; Drug abuse in her son; Heart disease in her mother; Hypertension in  her mother; Osteoporosis in her mother; Stroke in her mother. There is no history of Breast cancer.  ROS:   Please see the history of present illness.    (+) cough (+) fatigue (+)shortness of breath (+)fall  All other systems reviewed and are negative.  EKGs/Labs/Other Studies Reviewed:    The following studies were reviewed today:  Echo 02/24/2020:  IMPRESSIONS     1. Left ventricular ejection fraction, by estimation, is 30 to 35%. The  left ventricle has moderately decreased function. The left ventricle  demonstrates global hypokinesis. The left ventricular internal cavity size  was moderately dilated. Left  ventricular diastolic parameters are consistent with Grade I diastolic  dysfunction (impaired relaxation).   2. Right ventricular systolic function is normal. The right ventricular  size is normal. There is normal pulmonary artery systolic pressure.   3. Left atrial size was moderately dilated.   4. The mitral valve is normal in structure. Mild mitral valve  regurgitation. No evidence of mitral stenosis.   5. The aortic valve is normal in structure. Aortic valve regurgitation is  not visualized. No aortic stenosis is present.   6. The inferior vena cava is normal in size with greater than 50%  respiratory variability,  suggesting right atrial pressure of 3 mmHg.   Comparison(s): No significant change from prior study. Prior images  reviewed side by side  RHC/LHC 11/01/2019:   Prox LAD to Mid LAD lesion is 30% stenosed. Ost Cx to Prox Cx lesion is 35% stenosed. Prox RCA lesion is 35% stenosed. There is moderate left ventricular systolic dysfunction. LV end diastolic pressure is normal. The left ventricular ejection fraction is 35-45% by visual estimate.   1. Nonobstructive CAD 2. Moderate LV dysfunction. EF estimated at 40% with global hypokinesis 3. Normal LV filling pressures 4. Normal right heart pressures 5. Normal cardiac output.   Plan: medical therapy for LV dysfunction and lipid lowering therapy for nonobstructive CAD.  EKG:    10/08/2020: Not completed today 06/16/2020: SR, occasional PVCs, nonspecific ST T wave abnl.  Recent Labs: 06/10/2020: Hemoglobin 14.0; Platelets 262.0; TSH 2.66 09/17/2020: ALT 14; ALT 14; BUN 18; Creatinine, Ser 1.18; Potassium 4.1; Pro B Natriuretic peptide (BNP) 39.0; Sodium 139  Recent Lipid Panel    Component Value Date/Time   CHOL 155 12/09/2019 1645   TRIG 87 12/09/2019 1645   HDL 65 12/09/2019 1645   CHOLHDL 2.4 12/09/2019 1645   VLDL 28.4 07/19/2019 1333   LDLCALC 73 12/09/2019 1645   LDLDIRECT 146.7 05/29/2006 1115    Physical Exam:    VS:  BP 106/70 (BP Location: Left Arm, Patient Position: Sitting, Cuff Size: Large)   Pulse 73   Ht 5' 7" (1.702 m)   Wt 207 lb (93.9 kg)   BMI 32.42 kg/m     Wt Readings from Last 5 Encounters:  09/17/20 208 lb 12.8 oz (94.7 kg)  07/23/20 204 lb (92.5 kg)  06/16/20 201 lb 12.8 oz (91.5 kg)  06/10/20 200 lb 6.4 oz (90.9 kg)  05/18/20 201 lb (91.2 kg)    Constitutional: No acute distress Eyes: sclera non-icteric, normal conjunctiva and lids ENMT: normal dentition, moist mucous membranes Cardiovascular: regular rhythm, normal rate, no murmurs. S1 and S2 normal. Radial pulses normal bilaterally. No  jugular venous distention.  Respiratory: crackles bilaterally in the bases GI : normal bowel sounds, soft and nontender. No distention.   MSK: extremities warm, well perfused. No edema.  NEURO: grossly nonfocal exam, moves all extremities. PSYCH: alert  and oriented x 3, normal mood and affect.   ASSESSMENT:    1. Nonischemic cardiomyopathy ([place])   2. Acute combined systolic and diastolic heart failure (HCC)   3. HFrEF (heart failure with reduced ejection fraction) ([place])   4. Essential hypertension   5. Mixed hyperlipidemia   6. Medication management   7. Controlled type 2 diabetes mellitus without complication, without long-term current use of insulin (HCC)     PLAN:    HFrEF (heart failure with reduced ejection fraction) (HCC)  Acute combined systolic and diastolic heart failure Nonischemic cardiomyopathy ([place]) Essential hypertension - She is currently stable with class 3 dyspnea likely secondary to decompensated heart failure.  We will provide Lasix 40 mg twice daily and monitor for response.  Lab work in 7 days to monitor renal function while on other heart failure therapy. -EF is approximately 41% on MRI.  We discussed heart failure referral and we will place this today.  -Heart Failure Therapy  ACE-I/ARB/ARNI: Valsartan 320 mg daily BB: carvedilol 25 mg BID, transitioned from toprol xl for better BP control. MRA:  spironolactone 75 mg daily SGLT2I: farxiga 10 mg daily Loop diuretic: We will add Lasix 40 mg twice daily, lab work to evaluate renal function. Also on amlodipine 5 mg daily for HTN. BP low normal today.  Mixed hyperlipidemia - well treated with crestor 10 mg daily.   Total time of encounter: 30 minutes total time of encounter, including 20 minutes spent in face-to-face patient care on the date of this encounter. This time includes coordination of care and counseling regarding above mentioned problem list. Remainder of non-face-to-face time involved reviewing  chart documents/testing relevant to the patient encounter and documentation in the medical record. I have independently reviewed documentation from referring provider.   Follow-up in 2 weeks.  Cherlynn Kaiser, MD, [facility]    Medication Adjustments/Labs and Tests Ordered: Current medicines are reviewed at length with the patient today.  Concerns regarding medicines are outlined above.   Orders Placed This Encounter  Procedures   Basic metabolic panel   AMB referral to CHF clinic     Meds ordered this encounter  Medications   furosemide (LASIX) 40 MG tablet    Sig: Take 1 tablet (40 mg total) by mouth 2 (two) times daily.    Dispense:  20 tablet    Refill:  0     Patient Instructions  Medication Instructions:  START: FUROSEMIDE (LASIX) 69m TWICE DAILY FOR 7 DAYS *If you need a refill on your cardiac medications before your next appointment, please call your pharmacy*  Lab Work: RETURN: IN 7 DAYS FOR REPEAT BLOOD WORK- NO APPOINTMENT NEEDED  If you have labs (blood work) drawn today and your tests are completely normal, you will receive your results only by: [place](if you have MyChart) OR A paper copy in the mail If you have any lab test that is abnormal or we need to change your treatment, we will call you to review the results.  Follow-Up: [facility] you and your health needs are our priority.  As part of our continuing mission to provide you with exceptional heart care, we have created designated Provider Care Teams.  These Care Teams include your primary Cardiologist (physician) and Advanced Practice Providers (APPs -  Physician Assistants and Nurse Practitioners) who all work together to provide you with the care you need, when you need it.  We recommend signing up for the patient portal called "  MyChart".  Sign up information is provided on this After Visit Summary.  MyChart is used to connect with patients for Virtual Visits  (Telemedicine).  Patients are able to view lab/test results, encounter notes, upcoming appointments, etc.  Non-urgent messages can be sent to your provider as well.   To learn more about what you can do with MyChart, go to [email or web address].    Your next appointment:   AUGUST 10th at 9:20 AM  The format for your next appointment:   In Person  Provider:   Cherlynn Kaiser, MD  Other Instructions AMBULATORY REFERRAL TO [place] THIS     I,Zite Okoli,acting as a scribe for Elouise Munroe, MD.,have documented all relevant documentation on the behalf of Elouise Munroe, MD,as directed by  Elouise Munroe, MD while in the presence of Elouise Munroe, MD.   I, Elouise Munroe, MD, have reviewed all documentation for this visit. The documentation on 10/08/20 for the exam, diagnosis, procedures, and orders are all accurate and complete.

## 2020-10-08 NOTE — Patient Instructions (Addendum)
Medication Instructions:  START: FUROSEMIDE (LASIX) 74m TWICE DAILY FOR 7 DAYS *If you need a refill on your cardiac medications before your next appointment, please call your pharmacy*  Lab Work: RETURN: IN 7 DAYS FOR REPEAT BLOOD WORK- NO APPOINTMENT NEEDED  If you have labs (blood work) drawn today and your tests are completely normal, you will receive your results only by: [place](if you have MyChart) OR A paper copy in the mail If you have any lab test that is abnormal or we need to change your treatment, we will call you to review the results.  Follow-Up: [facility] you and your health needs are our priority.  As part of our continuing mission to provide you with exceptional heart care, we have created designated Provider Care Teams.  These Care Teams include your primary Cardiologist (physician) and Advanced Practice Providers (APPs -  Physician Assistants and Nurse Practitioners) who all work together to provide you with the care you need, when you need it.  We recommend signing up for the patient portal called "MyChart".  Sign up information is provided on this After Visit Summary.  MyChart is used to connect with patients for Virtual Visits (Telemedicine).  Patients are able to view lab/test results, encounter notes, upcoming appointments, etc.  Non-urgent messages can be sent to your provider as well.   To learn more about what you can do with MyChart, go to [email or web address]    Your next appointment:   AUGUST 10th at 9:20 AM  The format for your next appointment:   In Person  Provider:   GCherlynn Kaiser MD  Other Instructions AMBULATORY REFERRAL TO [facility]

## 2020-10-13 DIAGNOSIS — S8264XA Nondisplaced fracture of lateral malleolus of right fibula, initial encounter for closed fracture: Secondary | ICD-10-CM | POA: Diagnosis not present

## 2020-10-14 ENCOUNTER — Other Ambulatory Visit: Payer: Self-pay | Admitting: Internal Medicine

## 2020-10-16 DIAGNOSIS — I428 Other cardiomyopathies: Secondary | ICD-10-CM | POA: Diagnosis not present

## 2020-10-17 LAB — BASIC METABOLIC PANEL
BUN/Creatinine Ratio: 22 (ref 12–28)
BUN: 40 mg/dL — ABNORMAL HIGH (ref 8–27)
CO2: 23 mmol/L (ref 20–29)
Calcium: 9.9 mg/dL (ref 8.7–10.3)
Chloride: 98 mmol/L (ref 96–106)
Creatinine, Ser: 1.82 mg/dL — ABNORMAL HIGH (ref 0.57–1.00)
Glucose: 185 mg/dL — ABNORMAL HIGH (ref 65–99)
Potassium: 5.4 mmol/L — ABNORMAL HIGH (ref 3.5–5.2)
Sodium: 137 mmol/L (ref 134–144)
eGFR: 28 mL/min/{1.73_m2} — ABNORMAL LOW (ref >59)

## 2020-10-21 ENCOUNTER — Other Ambulatory Visit: Payer: Self-pay

## 2020-10-21 ENCOUNTER — Ambulatory Visit: Payer: Medicare Other | Admitting: Internal Medicine

## 2020-10-21 ENCOUNTER — Encounter: Payer: Self-pay | Admitting: Internal Medicine

## 2020-10-21 ENCOUNTER — Other Ambulatory Visit: Payer: Self-pay | Admitting: Family Medicine

## 2020-10-21 VITALS — BP 130/60 | HR 78 | Ht 67.0 in | Wt 185.4 lb

## 2020-10-21 DIAGNOSIS — I5041 Acute combined systolic (congestive) and diastolic (congestive) heart failure: Secondary | ICD-10-CM | POA: Diagnosis not present

## 2020-10-21 DIAGNOSIS — N179 Acute kidney failure, unspecified: Secondary | ICD-10-CM

## 2020-10-21 DIAGNOSIS — I428 Other cardiomyopathies: Secondary | ICD-10-CM

## 2020-10-21 DIAGNOSIS — I10 Essential (primary) hypertension: Secondary | ICD-10-CM

## 2020-10-21 DIAGNOSIS — I5020 Unspecified systolic (congestive) heart failure: Secondary | ICD-10-CM | POA: Diagnosis not present

## 2020-10-21 DIAGNOSIS — S8264XD Nondisplaced fracture of lateral malleolus of right fibula, subsequent encounter for closed fracture with routine healing: Secondary | ICD-10-CM | POA: Diagnosis not present

## 2020-10-21 DIAGNOSIS — Z79899 Other long term (current) drug therapy: Secondary | ICD-10-CM

## 2020-10-21 DIAGNOSIS — E782 Mixed hyperlipidemia: Secondary | ICD-10-CM

## 2020-10-21 LAB — BASIC METABOLIC PANEL
BUN/Creatinine Ratio: 19 (ref 12–28)
BUN: 30 mg/dL — ABNORMAL HIGH (ref 8–27)
CO2: 19 mmol/L — ABNORMAL LOW (ref 20–29)
Calcium: 10.2 mg/dL (ref 8.7–10.3)
Chloride: 102 mmol/L (ref 96–106)
Creatinine, Ser: 1.55 mg/dL — ABNORMAL HIGH (ref 0.57–1.00)
Glucose: 145 mg/dL — ABNORMAL HIGH (ref 65–99)
Potassium: 5.3 mmol/L — ABNORMAL HIGH (ref 3.5–5.2)
Sodium: 140 mmol/L (ref 134–144)
eGFR: 35 mL/min/{1.73_m2} — ABNORMAL LOW (ref >59)

## 2020-10-21 MED ORDER — FUROSEMIDE 40 MG PO TABS: 40.0000 mg | ORAL_TABLET | Freq: Every day | ORAL | 3 refills | Status: DC | PRN

## 2020-10-21 NOTE — Progress Notes (Signed)
Cardiology Office Note:    Date:  10/21/2020   ID:  Susan Hunt, DOB [birth year], MRN [id number]  PCP:  Leamon Arnt, MD  Cardiologist:  Elouise Munroe, MD  Electrophysiologist:  None   Referring MD: Leamon Arnt, MD   Chief Complaint/Reason for Referral: NICM  History of Present Illness:    Susan Hunt is a 75 y.o. female with a history of nonischemic cardiomyopathy with normal right and left heart pressures, hypertension, hyperlipidemia, interstitial lung disease with pulmonary fibrosis. NICM currently felt to be related to HTN. LVEF 41% on cardiac MRI.   Heart Failure Therapy currently ACE-I/ARB/ARNI: Valsartan 320 mg daily BB: carvedilol 25 mg BID, transitioned from toprol xl for better BP control. MRA:  spironolactone 75 mg daily SGLT2I: farxiga 10 mg daily   Also on amlodipine 5 mg daily for HTN.    Previously tried:  Entresto 49/51 mg (dry/itchy mouth, dry eyes) Lisinopril-hctz (SOB, coughing) Metoprolol tartrate (SOB, coughing) Clonidine  Today, she is accompanied by her husband. Feels she is much better after trialing lasix at last visit. Renal function abnormal, she stopped her NSAIDS for foot pain, we will recheck today. May need to hold spironolactone until full renal recovery. LE edema and fatigue/SOB much improved.  She denies chest pain, palpitations, lightheadedness, headaches, syncope, orthopnea, PND.   Past Medical History:  Diagnosis Date   Anemia    Asthma    "I dont think I have it.   CHF (congestive heart failure) (HCC)    Depression    Emphysema of lung (HCC)    GERD (gastroesophageal reflux disease)    Hemorrhoids    History of kidney stones    Cystoscopy   Hyperlipidemia    Hypertension    Hypothyroidism    Obesity    Pre-diabetes    Rosacea    Tubular adenoma of colon 03/26/2019   Colonoscopy repeat in 3 years    Past Surgical History:  Procedure Laterality Date   ABDOMINAL HYSTERECTOMY     ANTERIOR AND  POSTERIOR VAGINAL REPAIR  2020   CARDIAC CATHETERIZATION  11/01/2019   CATARACT EXTRACTION W/ INTRAOCULAR LENS IMPLANT Bilateral 2016   COLONOSCOPY     CYSTOSCOPY     DENTAL SURGERY  2016   Dental Implant   EYE SURGERY Bilateral    cataract   FRACTURE SURGERY Left 2013   Left ankle   INTERCOSTAL NERVE BLOCK Right 12/13/2019   Procedure: INTERCOSTAL NERVE BLOCK;  Surgeon: Melrose Nakayama, MD;  Location: [place];  Service: Thoracic;  Laterality: Right;   LUNG BIOPSY Right 12/13/2019   Procedure: LUNG BIOPSY;  Surgeon: Melrose Nakayama, MD;  Location: [place];  Service: Thoracic;  Laterality: Right;   LYMPH NODE BIOPSY Right 12/13/2019   Procedure: LYMPH NODE BIOPSY;  Surgeon: Melrose Nakayama, MD;  Location: [place];  Service: Thoracic;  Laterality: Right;   medtronix nerve stimulator  07/18/2019   Complete InterStim Sacral Neuromudulation system implantation 07/18/19, [facility], [place]   NASAL SINUS SURGERY     RIGHT/LEFT HEART CATH AND CORONARY ANGIOGRAPHY N/A 11/01/2019   Procedure: RIGHT/LEFT HEART CATH AND CORONARY ANGIOGRAPHY;  Surgeon: Martinique, Peter M, MD;  Location: [facility];  Service: Cardiovascular;  Laterality: N/A;   VAGINECTOMY, PARTIAL     VIDEO BRONCHOSCOPY N/A 12/13/2019   Procedure: VIDEO BRONCHOSCOPY;  Surgeon: Melrose Nakayama, MD;  Location: [facility];  Service: Thoracic;  Laterality: N/A;    Current Medications: Current Meds  Medication Sig  acetaminophen (TYLENOL) 500 MG tablet Take 2 tablets (1,000 mg total) by mouth every 6 (six) hours as needed.   amLODipine (NORVASC) 5 MG tablet TAKE ONE TABLET BY MOUTH DAILY   carvedilol (COREG) 25 MG tablet Take 1 tablet (25 mg total) by mouth 2 (two) times daily.   cephALEXin (KEFLEX) 250 MG capsule Take 250 mg by mouth at bedtime.   cetirizine (ZYRTEC) 10 MG tablet Take 10 mg by mouth daily.   FARXIGA 10 MG TABS tablet Take 10 mg by mouth daily.   fluticasone (FLONASE) 50 MCG/ACT nasal spray Place 2 sprays into both  nostrils at bedtime as needed (allergies.).    furosemide (LASIX) 40 MG tablet Take 1 tablet (40 mg total) by mouth 2 (two) times daily.   gabapentin (NEURONTIN) 300 MG capsule Take 1 capsule (300 mg total) by mouth 2 (two) times daily.   levothyroxine (SYNTHROID) 125 MCG tablet TAKE ONE TABLET BY MOUTH DAILY   montelukast (SINGULAIR) 10 MG tablet TAKE ONE TABLET BY MOUTH EVERY NIGHT AT BEDTIME   Multiple Vitamin (MULTIVITAMIN WITH MINERALS) TABS tablet Take 1 tablet by mouth daily.   omeprazole (PRILOSEC) 20 MG capsule TAKE ONE CAPSULE BY MOUTH DAILY   ondansetron (ZOFRAN-ODT) 4 MG disintegrating tablet DISSOLVE ONE TABLET BY MOUTH EVERY 8 HOURS AS NEEDED FOR NAUSEA/ VOMITING   Pirfenidone (ESBRIET) 801 MG TABS Take 801 mg by mouth with breakfast, with lunch, and with evening meal.   rosuvastatin (CRESTOR) 10 MG tablet TAKE ONE TABLET BY MOUTH DAILY   spironolactone (ALDACTONE) 50 MG tablet Take 1.5 tablets (75 mg total) by mouth daily.   valsartan (DIOVAN) 320 MG tablet Take 1 tablet (320 mg total) by mouth daily.   venlafaxine XR (EFFEXOR-XR) 150 MG 24 hr capsule Take 1 capsule (150 mg total) by mouth daily.     Allergies:   Ace inhibitors, Clarithromycin, Codeine, and Telmisartan   Social History   Tobacco Use   Smoking status: Former    Packs/day: 0.50    Years: 15.00    Pack years: 7.50    Types: Cigarettes    Quit date: 08/18/2013    Years since quitting: 7.1   Smokeless tobacco: Never  Vaping Use   Vaping Use: Never used  Substance Use Topics   Alcohol use: Not Currently    Alcohol/week: 1.0 standard drink    Types: 1 Glasses of wine per week    Comment: 2-3 glasses of wine weekly   Drug use: No     Family History: The patient's family history includes Alcohol abuse in her father; Diabetes in her maternal grandmother; Drug abuse in her son; Heart disease in her mother; Hypertension in her mother; Osteoporosis in her mother; Stroke in her mother. There is no history of  Breast cancer.  ROS:   Please see the history of present illness.    (+) cough (+) fatigue (+)shortness of breath (+)fall  All other systems reviewed and are negative.  EKGs/Labs/Other Studies Reviewed:    The following studies were reviewed today:  Echo 02/24/2020:  IMPRESSIONS     1. Left ventricular ejection fraction, by estimation, is 30 to 35%. The  left ventricle has moderately decreased function. The left ventricle  demonstrates global hypokinesis. The left ventricular internal cavity size  was moderately dilated. Left  ventricular diastolic parameters are consistent with Grade I diastolic  dysfunction (impaired relaxation).   2. Right ventricular systolic function is normal. The right ventricular  size is normal. There is normal  pulmonary artery systolic pressure.   3. Left atrial size was moderately dilated.   4. The mitral valve is normal in structure. Mild mitral valve  regurgitation. No evidence of mitral stenosis.   5. The aortic valve is normal in structure. Aortic valve regurgitation is  not visualized. No aortic stenosis is present.   6. The inferior vena cava is normal in size with greater than 50%  respiratory variability, suggesting right atrial pressure of 3 mmHg.   Comparison(s): No significant change from prior study. Prior images  reviewed side by side  RHC/LHC 11/01/2019:   Prox LAD to Mid LAD lesion is 30% stenosed. Ost Cx to Prox Cx lesion is 35% stenosed. Prox RCA lesion is 35% stenosed. There is moderate left ventricular systolic dysfunction. LV end diastolic pressure is normal. The left ventricular ejection fraction is 35-45% by visual estimate.   1. Nonobstructive CAD 2. Moderate LV dysfunction. EF estimated at 40% with global hypokinesis 3. Normal LV filling pressures 4. Normal right heart pressures 5. Normal cardiac output.   Plan: medical therapy for LV dysfunction and lipid lowering therapy for nonobstructive CAD.  EKG:     10/08/2020: Not completed today 06/16/2020: SR, occasional PVCs, nonspecific ST T wave abnl.  Recent Labs: 06/10/2020: Hemoglobin 14.0; Platelets 262.0; TSH 2.66 09/17/2020: ALT 14; ALT 14; Pro B Natriuretic peptide (BNP) 39.0 10/16/2020: BUN 40; Creatinine, Ser 1.82; Potassium 5.4; Sodium 137  Recent Lipid Panel    Component Value Date/Time   CHOL 155 12/09/2019 1645   TRIG 87 12/09/2019 1645   HDL 65 12/09/2019 1645   CHOLHDL 2.4 12/09/2019 1645   VLDL 28.4 07/19/2019 1333   LDLCALC 73 12/09/2019 1645   LDLDIRECT 146.7 05/29/2006 1115    Physical Exam:    VS:  BP 130/60 (BP Location: Left Arm)   Pulse 78   Ht _0  (1.702 m)   Wt 185 lb 6.4 oz (84.1 kg) Comment: PT did not put full weight on scale  SpO2 98%   BMI 29.04 kg/m     Wt Readings from Last 5 Encounters:  10/21/20 185 lb 6.4 oz (84.1 kg)  10/08/20 207 lb (93.9 kg)  09/17/20 208 lb 12.8 oz (94.7 kg)  07/23/20 204 lb (92.5 kg)  06/16/20 201 lb 12.8 oz (91.5 kg)    Constitutional: No acute distress Eyes: sclera non-icteric, normal conjunctiva and lids ENMT: normal dentition, moist mucous membranes Cardiovascular: regular rhythm, normal rate, no murmurs. S1 and S2 normal. Radial pulses normal bilaterally. No jugular venous distention.  Respiratory: crackles bilaterally in the bases GI : normal bowel sounds, soft and nontender. No distention.   MSK: extremities warm, well perfused. No edema.  NEURO: grossly nonfocal exam, moves all extremities. PSYCH: alert and oriented x 3, normal mood and affect.   ASSESSMENT:    1. Nonischemic cardiomyopathy ([place])   2. Acute combined systolic and diastolic heart failure (HCC)   3. HFrEF (heart failure with reduced ejection fraction) ([place])   4. Essential hypertension   5. Mixed hyperlipidemia   6. Medication management   7. AKI (acute kidney injury) ([place])    PLAN:    HFrEF (heart failure with reduced ejection fraction) (HCC)  Acute combined systolic and diastolic  heart failure Nonischemic cardiomyopathy ([place]) Essential hypertension - She has improved significantly with use of short term lasix. We will provide PRN lasix for symptoms and edema. Discussed monitoring weight, SOB and swelling and calling office with concerns. - she is back to her baseline Class  II dyspnea. -EF is approximately 41% on MRI.  We discussed heart failure referral, pending due to scheduling backlog. -Heart Failure Therapy  ACE-I/ARB/ARNI: Valsartan 320 mg daily BB: carvedilol 25 mg BID, transitioned from toprol xl for better BP control. MRA:  spironolactone 75 mg daily SGLT2I: farxiga 10 mg daily Loop diuretic: We will add Lasix 40 mg twice daily PRN. Also on amlodipine 5 mg daily for HTN. BP normal today.  AKI - recheck Cr today, may need to hold spironolactone temporarily if still impaired.  Mixed hyperlipidemia - well treated with crestor 10 mg daily.   Total time of encounter: 20 minutes total time of encounter, including 15 minutes spent in face-to-face patient care on the date of this encounter. This time includes coordination of care and counseling regarding above mentioned problem list. Remainder of non-face-to-face time involved reviewing chart documents/testing relevant to the patient encounter and documentation in the medical record. I have independently reviewed documentation from refe them Am[place] rring provider.   Cherlynn Kaiser, MD, [facility]    Medication Adjustments/Labs and Tests Ordered: Current medicines are reviewed at length with the patient today.  Concerns regarding medicines are outlined above.   Orders Placed This Encounter  Procedures   Basic metabolic panel     Meds ordered this encounter  Medications   furosemide (LASIX) 40 MG tablet    Sig: Take 1 tablet (40 mg total) by mouth daily as needed for fluid or edema.    Dispense:  30 tablet    Refill:  3      Patient Instructions  Medication Instructions:  No  Changes In Medications at this time.  *If you need a refill on your cardiac medications before your next appointment, please call your pharmacy*  Lab Work: BMET- TODAY  If you have labs (blood work) drawn today and your tests are completely normal, you will receive your results only by: [place] (if you have MyChart) OR A paper copy in the mail If you have any lab test that is abnormal or we need to change your treatment, we will call you to review the results.  Follow-Up: At [facility], you and your health needs are our priority.  As part of our continuing mission to provide you with exceptional heart care, we have created designated Provider Care Teams.  These Care Teams include your primary Cardiologist (physician) and Advanced Practice Providers (APPs -  Physician Assistants and Nurse Practitioners) who all work together to provide you with the care you need, when you need it.  Your next appointment:   November 8th 9:00am  The format for your next appointment:   In Person  Provider:   Cherlynn Kaiser, MD

## 2020-10-21 NOTE — Patient Instructions (Signed)
Medication Instructions:  No Changes In Medications at this time.  *If you need a refill on your cardiac medications before your next appointment, please call your pharmacy*  Lab Work: BMET- TODAY  If you have labs (blood work) drawn today and your tests are completely normal, you will receive your results only by: [place] (if you have MyChart) OR A paper copy in the mail If you have any lab test that is abnormal or we need to change your treatment, we will call you to review the results.  Follow-Up: At [facility], you and your health needs are our priority.  As part of our continuing mission to provide you with exceptional heart care, we have created designated Provider Care Teams.  These Care Teams include your primary Cardiologist (physician) and Advanced Practice Providers (APPs -  Physician Assistants and Nurse Practitioners) who all work together to provide you with the care you need, when you need it.  Your next appointment:   November 8th 9:00am  The format for your next appointment:   In Person  Provider:   Cherlynn Kaiser, MD

## 2020-10-28 ENCOUNTER — Other Ambulatory Visit: Payer: Self-pay | Admitting: Internal Medicine

## 2020-10-29 ENCOUNTER — Other Ambulatory Visit (INDEPENDENT_AMBULATORY_CARE_PROVIDER_SITE_OTHER): Payer: Medicare Other

## 2020-10-29 DIAGNOSIS — Z5181 Encounter for therapeutic drug level monitoring: Secondary | ICD-10-CM

## 2020-10-29 DIAGNOSIS — J84112 Idiopathic pulmonary fibrosis: Secondary | ICD-10-CM | POA: Diagnosis not present

## 2020-10-29 LAB — HEPATIC FUNCTION PANEL
ALT: 12 U/L (ref 0–35)
AST: 16 U/L (ref 0–37)
Albumin: 3.9 g/dL (ref 3.5–5.2)
Alkaline Phosphatase: 83 U/L (ref 39–117)
Bilirubin, Direct: 0.1 mg/dL (ref 0.0–0.3)
Total Bilirubin: 0.5 mg/dL (ref 0.2–1.2)
Total Protein: 7 g/dL (ref 6.0–8.3)

## 2020-11-02 DIAGNOSIS — I428 Other cardiomyopathies: Secondary | ICD-10-CM | POA: Diagnosis not present

## 2020-11-02 DIAGNOSIS — Z79899 Other long term (current) drug therapy: Secondary | ICD-10-CM | POA: Diagnosis not present

## 2020-11-03 DIAGNOSIS — Z79899 Other long term (current) drug therapy: Secondary | ICD-10-CM

## 2020-11-03 LAB — BASIC METABOLIC PANEL
BUN/Creatinine Ratio: 14 (ref 12–28)
BUN: 15 mg/dL (ref 8–27)
CO2: 20 mmol/L (ref 20–29)
Calcium: 9.6 mg/dL (ref 8.7–10.3)
Chloride: 103 mmol/L (ref 96–106)
Creatinine, Ser: 1.08 mg/dL — ABNORMAL HIGH (ref 0.57–1.00)
Glucose: 101 mg/dL — ABNORMAL HIGH (ref 65–99)
Potassium: 4.7 mmol/L (ref 3.5–5.2)
Sodium: 142 mmol/L (ref 134–144)
eGFR: 53 mL/min/{1.73_m2} — ABNORMAL LOW (ref >59)

## 2020-11-03 NOTE — Telephone Encounter (Signed)
Called and spoke with patient. Made patient aware of her recent Lab results. Patient states she is feeling better. Patient would like to know if she should restart her Spironolactone at previous dose of 39m Daily? Advised patient I would forward message to Dr. AMargaretann Lovelessfor her to review and advise. Patient verbalized understanding.    GElouise Munroe MD  11/03/2020  5:24 AM EDT     Cr and K back in a more normal range. Please call patient and see how she is feeling off of spironolactone. We can try to resume it now that labs have improved.

## 2020-11-17 DIAGNOSIS — Z86010 Personal history of colonic polyps: Secondary | ICD-10-CM | POA: Diagnosis not present

## 2020-11-17 DIAGNOSIS — K640 First degree hemorrhoids: Secondary | ICD-10-CM | POA: Diagnosis not present

## 2020-11-17 DIAGNOSIS — D124 Benign neoplasm of descending colon: Secondary | ICD-10-CM | POA: Diagnosis not present

## 2020-11-17 DIAGNOSIS — D122 Benign neoplasm of ascending colon: Secondary | ICD-10-CM | POA: Diagnosis not present

## 2020-11-18 DIAGNOSIS — S8264XD Nondisplaced fracture of lateral malleolus of right fibula, subsequent encounter for closed fracture with routine healing: Secondary | ICD-10-CM | POA: Diagnosis not present

## 2020-11-18 LAB — HM COLONOSCOPY

## 2020-11-19 DIAGNOSIS — D124 Benign neoplasm of descending colon: Secondary | ICD-10-CM | POA: Diagnosis not present

## 2020-11-19 DIAGNOSIS — D122 Benign neoplasm of ascending colon: Secondary | ICD-10-CM | POA: Diagnosis not present

## 2020-11-19 DIAGNOSIS — Z79899 Other long term (current) drug therapy: Secondary | ICD-10-CM | POA: Diagnosis not present

## 2020-11-20 LAB — BASIC METABOLIC PANEL
BUN/Creatinine Ratio: 16 (ref 12–28)
BUN: 21 mg/dL (ref 8–27)
CO2: 23 mmol/L (ref 20–29)
Calcium: 9.8 mg/dL (ref 8.7–10.3)
Chloride: 102 mmol/L (ref 96–106)
Creatinine, Ser: 1.34 mg/dL — ABNORMAL HIGH (ref 0.57–1.00)
Glucose: 100 mg/dL — ABNORMAL HIGH (ref 65–99)
Potassium: 4.9 mmol/L (ref 3.5–5.2)
Sodium: 141 mmol/L (ref 134–144)
eGFR: 41 mL/min/{1.73_m2} — ABNORMAL LOW (ref >59)

## 2020-11-23 DIAGNOSIS — R4020 Unspecified coma: Secondary | ICD-10-CM | POA: Diagnosis not present

## 2020-11-23 DIAGNOSIS — W19XXXA Unspecified fall, initial encounter: Secondary | ICD-10-CM | POA: Diagnosis not present

## 2020-11-23 DIAGNOSIS — S299XXA Unspecified injury of thorax, initial encounter: Secondary | ICD-10-CM | POA: Diagnosis not present

## 2020-12-01 ENCOUNTER — Encounter: Payer: Self-pay | Admitting: Family Medicine

## 2020-12-11 ENCOUNTER — Other Ambulatory Visit: Payer: Self-pay

## 2020-12-11 ENCOUNTER — Ambulatory Visit (INDEPENDENT_AMBULATORY_CARE_PROVIDER_SITE_OTHER): Payer: Medicare Other | Admitting: Family Medicine

## 2020-12-11 ENCOUNTER — Encounter: Payer: Self-pay | Admitting: Family Medicine

## 2020-12-11 VITALS — BP 103/65 | HR 76 | Temp 98.1°F | Ht 67.0 in | Wt 205.0 lb

## 2020-12-11 DIAGNOSIS — E039 Hypothyroidism, unspecified: Secondary | ICD-10-CM

## 2020-12-11 DIAGNOSIS — E782 Mixed hyperlipidemia: Secondary | ICD-10-CM | POA: Diagnosis not present

## 2020-12-11 DIAGNOSIS — Z0000 Encounter for general adult medical examination without abnormal findings: Secondary | ICD-10-CM | POA: Diagnosis not present

## 2020-12-11 DIAGNOSIS — K760 Fatty (change of) liver, not elsewhere classified: Secondary | ICD-10-CM

## 2020-12-11 DIAGNOSIS — I5020 Unspecified systolic (congestive) heart failure: Secondary | ICD-10-CM | POA: Diagnosis not present

## 2020-12-11 DIAGNOSIS — J849 Interstitial pulmonary disease, unspecified: Secondary | ICD-10-CM | POA: Diagnosis not present

## 2020-12-11 DIAGNOSIS — S2242XD Multiple fractures of ribs, left side, subsequent encounter for fracture with routine healing: Secondary | ICD-10-CM | POA: Diagnosis not present

## 2020-12-11 DIAGNOSIS — I10 Essential (primary) hypertension: Secondary | ICD-10-CM

## 2020-12-11 DIAGNOSIS — Z23 Encounter for immunization: Secondary | ICD-10-CM | POA: Diagnosis not present

## 2020-12-11 DIAGNOSIS — J431 Panlobular emphysema: Secondary | ICD-10-CM | POA: Diagnosis not present

## 2020-12-11 DIAGNOSIS — E119 Type 2 diabetes mellitus without complications: Secondary | ICD-10-CM | POA: Diagnosis not present

## 2020-12-11 MED ORDER — OMEPRAZOLE 20 MG PO CPDR: 20.0000 mg | DELAYED_RELEASE_CAPSULE | Freq: Every day | ORAL | 3 refills | Status: DC

## 2020-12-11 MED ORDER — VENLAFAXINE HCL ER 150 MG PO CP24: 150.0000 mg | ORAL_CAPSULE | Freq: Every day | ORAL | 3 refills | Status: DC

## 2020-12-11 MED ORDER — HYDROCODONE-ACETAMINOPHEN 5-325 MG PO TABS: 1.0000 | ORAL_TABLET | Freq: Four times a day (QID) | ORAL | 0 refills | Status: DC | PRN

## 2020-12-11 NOTE — Patient Instructions (Addendum)
Please return in 6 months for recheck diabetes and follow up.   I will release your lab results to you on your MyChart account with further instructions. Please reply with any questions.    If you have any questions or concerns, please don't hesitate to send me a message via MyChart or call the office at [phone]. Thank you for visiting with [place] today! It's our pleasure caring for you.

## 2020-12-12 LAB — LIPID PANEL
Cholesterol: 159 mg/dL (ref <200)
HDL: 52 mg/dL (ref >=50)
LDL Cholesterol (Calc): 81 mg/dL (calc)
Non-HDL Cholesterol (Calc): 107 mg/dL (calc) (ref <130)
Total CHOL/HDL Ratio: 3.1 (calc) (ref <5.0)
Triglycerides: 163 mg/dL — ABNORMAL HIGH (ref <150)

## 2020-12-12 LAB — COMPREHENSIVE METABOLIC PANEL
AG Ratio: 1.4 (calc) (ref 1.0–2.5)
ALT: 16 U/L (ref 6–29)
AST: 20 U/L (ref 10–35)
Albumin: 4.2 g/dL (ref 3.6–5.1)
Alkaline phosphatase (APISO): 114 U/L (ref 37–153)
BUN/Creatinine Ratio: 14 (calc) (ref 6–22)
BUN: 16 mg/dL (ref 7–25)
CO2: 24 mmol/L (ref 20–32)
Calcium: 9.7 mg/dL (ref 8.6–10.4)
Chloride: 107 mmol/L (ref 98–110)
Creat: 1.11 mg/dL — ABNORMAL HIGH (ref 0.60–1.00)
Globulin: 2.9 g/dL (calc) (ref 1.9–3.7)
Glucose, Bld: 119 mg/dL — ABNORMAL HIGH (ref 65–99)
Potassium: 4.5 mmol/L (ref 3.5–5.3)
Sodium: 141 mmol/L (ref 135–146)
Total Bilirubin: 0.4 mg/dL (ref 0.2–1.2)
Total Protein: 7.1 g/dL (ref 6.1–8.1)

## 2020-12-12 LAB — TSH: TSH: 0.73 mIU/L (ref 0.40–4.50)

## 2020-12-12 LAB — HEMOGLOBIN A1C
Hgb A1c MFr Bld: 6.2 % of total Hgb — ABNORMAL HIGH (ref <5.7)
Mean Plasma Glucose: 131 mg/dL
eAG (mmol/L): 7.3 mmol/L

## 2020-12-14 NOTE — Progress Notes (Signed)
Subjective  Chief Complaint  Patient presents with   Annual Exam    Requesting pain medication for broken ribs     HPI: Susan Hunt is a 75 y.o. female who presents to [facility] today for a Female Wellness Visit. She also has the concerns and/or needs as listed above in the chief complaint. These will be addressed in addition to the Health Maintenance Visit.   Wellness Visit: annual visit with health maintenance review and exam without Pap  HM: due flu shot. Screens are up to date. Due eye exam. Overall doing ok however has fallen twice in the last year: broken ribs most recently. Requesting pain meds.   Chronic disease f/u and/or acute problem visit: (deemed necessary to be done in addition to the wellness visit): DM: remains controlled. No sxs of hyperglyemia. Diet is fair. Farxiga 10 for heart failure as well. No concerns. Reviewed cards and pulm notes: ILD and CHF. Multiple medication changes. Now tolerating ARB, bb. Breath remains short with activity.  HLD on statin due for recheck. Atrovastatin 20 Depression: stable on high dose effexor Low thyroid on synthroid and compliant w/o sxs of low or high thyroid. We had adjust up medication dose earlier in the year; tolerating well.  H/o liver fatty   Assessment  1. Annual physical exam   2. Controlled type 2 diabetes mellitus without complication, without long-term current use of insulin ([place])   3. Essential hypertension   4. HFrEF (heart failure with reduced ejection fraction) ([place])   5. Interstitial lung disease ([place])   6. Mixed hyperlipidemia   7. Acquired hypothyroidism   8. Panlobular emphysema ([place])   9. Hepatic steatosis   10. Closed fracture of multiple ribs of left side with routine healing, subsequent encounter   11. Need for immunization against influenza      Plan  Female Wellness Visit: Age appropriate Health Maintenance and Prevention measures were discussed with patient.  Included topics are cancer screening recommendations, ways to keep healthy (see AVS) including dietary and exercise recommendations, regular eye and dental care, use of seat belts, and avoidance of moderate alcohol use and tobacco use.  BMI: discussed patient's BMI and encouraged positive lifestyle modifications to help get to or maintain a target BMI. HM needs and immunizations were addressed and ordered. See below for orders. See HM and immunization section for updates. Flu shot today Routine labs and screening tests ordered including cmp, cbc and lipids where appropriate. Discussed recommendations regarding Vit D and calcium supplementation (see AVS)  Chronic disease management visit and/or acute problem visit: DM: well controlled. Contineu farxiga 10. Renal function is stable. Needs eye exam.  HLD recheck today on lipitor Heart failure and ILD per specialists. Reviewed meds.  Fracrtures, ribs: norco for pain. Discussed deep breathing exercises to prevent complications. Discussed fall prevention Recheck thyroid to ensure stable on current dose.  HTN: controlled.  Follow up: No follow-ups on file.  Orders Placed This Encounter  Procedures   Flu Vaccine QUAD High Dose(Fluad)   Comprehensive metabolic panel   Hemoglobin A1c   Lipid panel   TSH   Meds ordered this encounter  Medications   venlafaxine XR (EFFEXOR-XR) 150 MG 24 hr capsule    Sig: Take 1 capsule (150 mg total) by mouth daily.    Dispense:  90 capsule    Refill:  3   omeprazole (PRILOSEC) 20 MG capsule    Sig: Take 1 capsule (20 mg total) by mouth  daily.    Dispense:  90 capsule    Refill:  3   HYDROcodone-acetaminophen (NORCO) 5-325 MG tablet    Sig: Take 1 tablet by mouth every 6 (six) hours as needed for moderate pain.    Dispense:  30 tablet    Refill:  0      Body mass index is 32.11 kg/m. Wt Readings from Last 3 Encounters:  12/11/20 205 lb (93 kg)  10/21/20 185 lb 6.4 oz (84.1 kg)  10/08/20 207 lb (93.9  kg)     Patient Active Problem List   Diagnosis Date Noted   Interstitial lung disease ([place]) 11/01/2019    Priority: 1.   HFrEF (heart failure with reduced ejection fraction) ([place]) 11/01/2019    Priority: 1.   Controlled type 2 diabetes mellitus without complication, without long-term current use of insulin ([place]) 07/19/2019    Priority: 1.   Tubular adenoma of colon 03/26/2019    Priority: 1.    Colonoscopy repeat in 3 years    Panlobular emphysema ([place]) 06/17/2014    Priority: 1.    Overview:  Long-term former smoker, chest CT March 2016 shows early fibrotic changes and apices and emphysematous changes.  Inhalers started    Major depression, chronic 01/28/2014    Priority: 1.   Obesity (BMI 30.0-34.9) 07/15/2013    Priority: 1.   Acquired hypothyroidism 04/11/2007    Priority: 1.   Mixed hyperlipidemia 04/11/2007    Priority: 1.   Essential hypertension 04/11/2007    Priority: 1.   Recurrent UTI 04/12/2019    Priority: 2.    Daily prophylaxis started with [facility] January 2021    Hepatic steatosis 03/26/2019    Priority: 2.   Osteopenia of femoral neck 05/24/2016    Priority: 2.    Dexa 05/2019 T = - 1.7 femur. Stable. Recheck 2-3 years. Dexa 05/2016 T = -1.4; stable. No significant decrease. Recheck in 2-3 years.    RENAL CALCULUS, HX OF 04/11/2007    Priority: 2.   ACE-inhibitor cough 10/02/2017    Priority: 3.   Postmenopausal symptoms 02/14/2017    Priority: 3.    Uses clonidine prn at night to help her sleep IF needed for hot flushes or poor sleep. Started by GYN years ago. occasional use.    Grade II hemorrhoids 05/03/2016    Priority: 3.   Snoring 05/06/2014    Priority: 3.    Overview:  Negative sleep study 2013    Allergic rhinitis 12/18/2009    Priority: 3.    Overview:  10/1 IMO update    Rosacea 04/11/2007    Priority: 3.    Qualifier: Diagnosis of  By: Hulan Saas, CMA (AAMA), Shannon S     URINARY INCONTINENCE 04/11/2007    Priority: 3.     Qualifier: Diagnosis of  By: Regis Bill MD, [place] study patient 06/24/2020   Chronic vertigo 07/19/2019   Health Maintenance  Topic Date Due   COVID-19 Vaccine (4 - Booster for Moderna series) 03/31/2020   OPHTHALMOLOGY EXAM  04/09/2020   HEMOGLOBIN A1C  06/10/2021   DEXA SCAN  06/13/2021   MAMMOGRAM  07/28/2021   TETANUS/TDAP  10/15/2021   FOOT EXAM  12/11/2021   COLONOSCOPY (Pts 45-76yr Insurance coverage will need to be confirmed)  11/19/2023   INFLUENZA VACCINE  Completed   Hepatitis C Screening  Completed   Zoster Vaccines- Shingrix  Completed   HPV VACCINES  Aged Out   Immunization History  Administered Date(s) Administered   Fluad Quad(high Dose 65+) 12/09/2019, 12/11/2020   Hepatitis A, Adult 04/06/2018   Influenza Split 01/06/2009   Influenza, High Dose Seasonal PF 12/17/2014, 12/20/2015, 01/15/2017, 12/18/2017, 11/28/2018   Influenza, Seasonal, Injecte, Preservative Fre 01/07/2014, 12/17/2014   Influenza,inj,Quad PF,6+ Mos 01/15/2017   Influenza,trivalent, recombinat, inj, PF 12/03/2012   Influenza-Unspecified 01/15/2017   Moderna SARS-COV2 Booster Vaccination 01/07/2020   Moderna Sars-Covid-2 Vaccination 04/17/2019, 05/15/2019   Pneumococcal Conjugate-13 01/07/2014   Pneumococcal Polysaccharide-23 11/23/2011   Td 03/14/2006, 10/16/2011   Zoster Recombinat (Shingrix) 10/21/2017, 12/18/2017   Zoster, Live 12/06/2013   We updated and reviewed the patient's past history in detail and it is documented below. Allergies: Patient is allergic to ace inhibitors, clarithromycin, codeine, and telmisartan. Past Medical History Patient  has a past medical history of Anemia, Asthma, CHF (congestive heart failure) ([place]), Depression, Emphysema of lung ([place]), GERD (gastroesophageal reflux disease), Hemorrhoids, History of kidney stones, Hyperlipidemia, Hypertension, Hypothyroidism, Obesity, Pre-diabetes, Rosacea, and Tubular adenoma of colon (03/26/2019). Past  Surgical History Patient  has a past surgical history that includes Nasal sinus surgery; Dental surgery (2016); Cataract extraction w/ intraocular lens implant (Bilateral, 2016); Fracture surgery (Left, 2013); Vaginectomy, partial; Anterior and posterior vaginal repair (2020); medtronix nerve stimulator (07/18/2019); Colonoscopy; Cystoscopy; RIGHT/LEFT HEART CATH AND CORONARY ANGIOGRAPHY (N/A, 11/01/2019); Abdominal hysterectomy; Cardiac catheterization (11/01/2019); Eye surgery (Bilateral); Lung biopsy (Right, 12/13/2019); Lymph node biopsy (Right, 12/13/2019); Video bronchoscopy (N/A, 12/13/2019); and Intercostal nerve block (Right, 12/13/2019). Family History: Patient family history includes Alcohol abuse in her father; Diabetes in her maternal grandmother; Drug abuse in her son; Heart disease in her mother; Hypertension in her mother; Osteoporosis in her mother; Stroke in her mother. Social History:  Patient  reports that she quit smoking about 7 years ago. Her smoking use included cigarettes. She has a 7.50 pack-year smoking history. She has never used smokeless tobacco. She reports that she does not currently use alcohol after a past usage of about 1.0 standard drink per week. She reports that she does not use drugs.  Review of Systems: Constitutional: negative for fever or malaise Ophthalmic: negative for photophobia, double vision or loss of vision Cardiovascular: negative for chest pain, dyspnea on exertion, or new LE swelling Respiratory: negative for SOB or persistent cough Gastrointestinal: negative for abdominal pain, change in bowel habits or melena Genitourinary: negative for dysuria or gross hematuria, no abnormal uterine bleeding or disharge Musculoskeletal: negative for new gait disturbance or muscular weakness Integumentary: negative for new or persistent rashes, no breast lumps Neurological: negative for TIA or stroke symptoms Psychiatric: negative for SI or  delusions Allergic/Immunologic: negative for hives  Patient Care Team    Relationship Specialty Notifications Start End  Leamon Arnt, MD PCP - General Family Medicine  10/04/17   Elouise Munroe, MD PCP - Cardiology Cardiology  06/18/20   Elroy Channel, MD Referring Physician Internal Medicine  10/02/17   Rockey Situ Consulting Physician Obstetrics and Gynecology  03/26/19   Trinda Pascal, [place] Physician Urology  03/26/19   Brand Males, MD Consulting Physician Pulmonary Disease  08/20/19     Objective  Vitals: BP 103/65   Pulse 76   Temp 98.1 F (36.7 C) (Temporal)   Ht _0  (1.702 m)   Wt 205 lb (93 kg)   SpO2 96%   BMI 32.11 kg/m  General:  Well developed, well nourished, no acute distress  Psych:  Alert and orientedx3,normal mood and affect HEENT:  Normocephalic, atraumatic, non-icteric  sclera,  supple neck without adenopathy, mass or thyromegaly Cardiovascular:  Normal S1, S2, RRR without gallop, rub or murmur Respiratory:  Good breath sounds bilaterally, CTAB with normal respiratory effort Gastrointestinal: normal bowel sounds, soft, non-tender, no noted masses. No HSM MSK: no deformities, contusions. Joints are without erythema or swelling.    Commons side effects, risks, benefits, and alternatives for medications and treatment plan prescribed today were discussed, and the patient expressed understanding of the given instructions. Patient is instructed to call or message via MyChart if he/she has any questions or concerns regarding our treatment plan. No barriers to understanding were identified. We discussed Red Flag symptoms and signs in detail. Patient expressed understanding regarding what to do in case of urgent or emergency type symptoms.  Medication list was reconciled, printed and provided to the patient in AVS. Patient instructions and summary information was reviewed with the patient as documented in the AVS. This note was prepared with  assistance of Dragon voice recognition software. Occasional wrong-word or sound-a-like substitutions may have occurred due to the inherent limitations of voice recognition software  This visit occurred during the SARS-CoV-2 public health emergency.  Safety protocols were in place, including screening questions prior to the visit, additional usage of staff PPE, and extensive cleaning of exam room while observing appropriate contact time as indicated for disinfecting solutions.

## 2020-12-16 DIAGNOSIS — S8264XD Nondisplaced fracture of lateral malleolus of right fibula, subsequent encounter for closed fracture with routine healing: Secondary | ICD-10-CM | POA: Diagnosis not present

## 2020-12-16 DIAGNOSIS — R0781 Pleurodynia: Secondary | ICD-10-CM | POA: Diagnosis not present

## 2020-12-16 DIAGNOSIS — S2231XA Fracture of one rib, right side, initial encounter for closed fracture: Secondary | ICD-10-CM | POA: Diagnosis not present

## 2020-12-27 ENCOUNTER — Other Ambulatory Visit: Payer: Self-pay | Admitting: Internal Medicine

## 2021-01-04 ENCOUNTER — Other Ambulatory Visit: Payer: Self-pay

## 2021-01-04 ENCOUNTER — Ambulatory Visit: Payer: Medicare Other | Admitting: Internal Medicine

## 2021-01-04 ENCOUNTER — Encounter: Payer: Self-pay | Admitting: Internal Medicine

## 2021-01-04 ENCOUNTER — Ambulatory Visit (INDEPENDENT_AMBULATORY_CARE_PROVIDER_SITE_OTHER): Payer: Medicare Other

## 2021-01-04 VITALS — BP 114/62 | HR 72 | Temp 97.8°F | Ht 67.0 in | Wt 203.2 lb

## 2021-01-04 DIAGNOSIS — Z5181 Encounter for therapeutic drug level monitoring: Secondary | ICD-10-CM | POA: Diagnosis not present

## 2021-01-04 DIAGNOSIS — R0609 Other forms of dyspnea: Secondary | ICD-10-CM | POA: Diagnosis not present

## 2021-01-04 DIAGNOSIS — R0600 Dyspnea, unspecified: Secondary | ICD-10-CM | POA: Diagnosis not present

## 2021-01-04 DIAGNOSIS — Z8781 Personal history of (healed) traumatic fracture: Secondary | ICD-10-CM

## 2021-01-04 DIAGNOSIS — J84112 Idiopathic pulmonary fibrosis: Secondary | ICD-10-CM

## 2021-01-04 DIAGNOSIS — R635 Abnormal weight gain: Secondary | ICD-10-CM

## 2021-01-04 DIAGNOSIS — J8410 Pulmonary fibrosis, unspecified: Secondary | ICD-10-CM | POA: Diagnosis not present

## 2021-01-04 NOTE — Progress Notes (Signed)
OV 08/19/2019  Subjective:  Patient ID: Susan Hunt, female , DOB: 1945/05/29 , age 75 y.o. , MRN: [id number] , ADDRESS: [place] [address]   08/19/2019 -   Chief Complaint  Patient presents with   Consult    COPD, emphysema.  sob with exertion.  dry coughs alot at night     HPI Susan Hunt 75 y.o. -she is a retired Marine scientist.  She used to work at the [employer] across [place] long and having retired from that 10 years ago and then she worked for another agency apparently having retired from all duties few years ago.  Husband is a retired Software engineer.  They are here for shortness of breath.  She tells me that she has had insidious onset of shortness of breath and cough that started 1 year ago and has been progressive.  In the same time she is also been on nitrofurantoin for times.  First 3 of these were 1 week each.  The last 1 being for at least 6 weeks ending approximately 4-6 weeks ago.  Because of progressive symptoms she was given Stiolto but this did not help.  She has been on longstanding Singulair for several years independent of these problems.  This because of allergies.  Therefore she has been referred here.  She did have a chest x-ray that on my personal visualization shows ILD changes.  It is documented below.  She did have a CT abdomen lung images that was reported as normal but I am not so sure from a year ago.  She is a previous smoker.  She is also self-referred herself to [facility] on the basis that she think she might have advanced COPD and is looking for a Zephyr valve.  Symptoms started in August 2020.   [place] Integrated Comprehensive ILD Questionnaire  Symptoms:  She does have a cough.  She says the cough started in August 2020 the same for shortness of breath started.  She does cough at night.  She does cough when she lies down and it gets worse.  She does feel a tickle in her throat.  Cough does not affect her voice  she does not bring any phlegm.  There is no wheezing.  There is no nausea no vomiting no diarrhea.    Past Medical History :   She does have a histo abdomen ry of asthma for the last few years.  Also COPD not otherwise specified for the last few years.  His acid reflux disease for the last few years.  Diabetes for the last few weeks and thyroid disease for the last few years.  Otherwise denies any collagen vascular disease or vasculitis.  Denies any sleep apnea.  Denies pulmonary hypertension.  Denies stroke denies pneumonias recurrent pneumonias.  Denies heart disease or pleurisy.   ROS: Positive for fatigue for the last several months.  Arthralgia for the last several months.  Dry eyes for the last several months.  With intermittent nausea for the last few years.  T there is daytime sleepiness for the last several months.     FAMILY HISTORY of LUNG DISEASE:  -Denies any pulmonary fibrosis.  Denies COPD denies asthma in the family.  Denies sarcoid denies cystic fibrosis denies hypersensitive pneumonitis.  Denies any autoimmune disease.   EXPOSURE HISTORY:   -She denies any Covid history.  Denies any exposure to Covid.  She did do Covid vaccine.  No reactions  after the Covid vaccine.  Did smoke cigarettes 15-year 2000 and 2015 10 to 12 cigarettes a day.  Did not do any passive smoking.  Never did any marijuana.  Elevated electronic cigarettes.  Never use cocaine never used intravenous drug use.   HOME and HOBBY DETAILS :  -[place]  Is lived there for 21 years.  Age of the home is 34 years.  In 2004 there was mold/mildew in the shower curtain.  And then there was mold under the shower.  This was torn down.  This was in 2004 but currently none.  She did some mulch back at work 2 months ago.  No humidifier use no CPAP use.  No nebulizer use.  No steam iron use.  No Jacuzzi use.  No misting Fountain in the house.  No pet birds or parakeets no gerbils.  No feather pillows.   No mold in the [facility] duct.  No music habits.   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  No otehrr organic antigen exposure that she recollects (MR side note: a different patient in 2016 reported that in 2013-2016 time lot of renovations there and mold +).  Patient reported on September 10, 2019: That between 1966 and 1980 she worked at [employer] and [place].  At that time there is lot of mold exposure in the building.  The building was cleaned out.  Also checked for inorganic antigen exposure and this is negative.  Never worked in a dusty environment.  Never exposed to fumes.   PULMONARY TOXICITY HISTORY (27 items): Use nitrofurantoin in 2019 and 2020.  This is described below.  Question if you can for this there is      Simple office walk 185 feet x  3 laps goal with forehead probe 08/19/2019   O2 used ra  Number laps completed Attempted 3 but desaturated at 2  Comments about pace Normal pace  Resting Pulse Ox/HR 100% and 101/min  Final Pulse Ox/HR 86% and 116/min  Desaturated </= 88% yes  Desaturated <= 3% points Yes , 14  Got Tachycardic >/= 90/min yes  Symptoms at end of test dyspneic  Miscellaneous comments Corrected with  2L [place]    Nuclear medicine cardiac stress test March 2016: Overall Impression:  Normal stress nuclear study with a small, mild, fixed apical defect consistent with thinning; no ischemia. LV Wall Motion:  NL LV Function; NL Wall Motion    CT abdomen lung cuts September 2020: Reported as normal.  I personally visualized and retrospect looks hazy and whether this is atelectasis or some other findings I do not know.  Jul 19, 2019:: Personally visualized shows findings of ILD.  I am not able to see a prior chest x-ray in the system or CT scan of the chest    Results for LASHAYE, FISK "PAM" (MRN [id number]) as of 08/19/2019 15:18  Ref.  Range 03/26/2019 13:45  Creatinine Latest Ref Range: 0.40 - 1.20 mg/dL 1.05   ROS - per HPI Results for JATASIA, GUNDRUM "PAM" (MRN [id number]) as of 08/19/2019 15:18  Ref. Range 03/26/2019 13:45  Hemoglobin Latest Ref Range: 12.0 - 15.0 g/dL 14.0     OV 09/10/2019  Subjective:  Patient ID: Susan Hunt, female , DOB: [birth year] , age 54 y.o. , MRN: [id number] , ADDRESS: [id number] [place] [address]  09/10/2019 -   Chief Complaint  Patient presents with   Follow-up    shortness of breath with exertion and non-productive cough     HPI RETTIE LAIRD 75 y.o. -presents for follow-up to discuss results of dyspnea work-up.  High-resolution CT chest was personally visualized.  It shows presence of interstitial lung disease.  It needs Fleischner criteria for diagnosis not consistent with UIP.  Per ATS this is alternative diagnosis to UIP.  Radiologist raised the concern of hypersensitive pneumonitis but there is no air-trapping.  The other possibilities NSIP.  She is no longer on nitrofurantoin.  In any event unit she only took it intermittently.  Overall she is stable since last visit.  She had autoimmune profile.  Is only trace positive for ANA.  Her IgE is slightly elevated.  Her PFT shows mild restriction with reduction in diffusion capacity.  We went over her exposure history again.  This time she did recollect being exposed to mold for 15 to 20 years up until 1980 while living in [place] and working at [employer].  She does not recollect mold at behavioral [facility] where she worked recently until she retired.  However another patient of mine did recollect mold in this building at [employer].   Low risk cardiac stress test in 2016 but with ejection fraction 54%.-Since then no overall change in health.  Husband inquired about the low ejection fraction.  In terms of symptoms: She is try to get portable oxygen system.  She is switching company to [employer] because of  the shortage of portable oxygen systems at [facility].     OV 11/12/2019   Subjective:  Patient ID: Susan Hunt, female , DOB: 1945-09-25, age 85 y.o. years. , MRN: [id number],  ADDRESS: [place] [address] PCP  Leamon Arnt, MD Providers : Treatment Team:  Attending Provider: Brand Males, MD Thoracic surgeon: Dr. Roxan Hockey Cardiologist Dr. Margaretann Loveless  Chief Complaint  Patient presents with   Follow-up    SOB    Follow-up interstitial lung disease not otherwise specified   HPI OAKLEIGH HESKETH 75 y.o. -returns for follow-up.  Her husband is with her.  After her last visit she saw cardiothoracic surgery but we had to hold the surgical lung biopsy because her echocardiogram from September 13, 2019 showed a drop in left ventricular systolic ejection fraction.  Since then she is seen cardiology.  She had a heart catheterization that shows ejection action 40% still low.  Nonobstructive coronary artery disease.  She is followed up with cardiology and she has been started on beta-blocker.  On her father medications was also adjusted.  So far no change in dyspnea.  She tells me overall dyspnea is the same without any change.  Symptom scores shows a two-point shift with worsening but she told the medical assistant that she is feeling worse.  Walking desaturation test today shows - normalcy .  She wants portable oxygen but the first DME company [facility] did not have portable oxygen tanks.  She is found another company [employer] that is able to give her portable oxygen.  She needs a requalifying walk that is documented below.  In terms of surgical lung biopsy: She did have some prominent mediastinal nodes.  It appears Dr. Roxan Hockey has informed them that he will do a EBUS for this.  They have also been told by cardiology that from a cardiac standpoint patient would be a suitable candidate for surgical lung biopsy.  The husband wanted to know if a surgical biopsy is  indicated and also my perspective and safety.  xxxxxxxxxxxxxxxxxxxxxxxxxx Results for CAILEY, TRIGUEROS "PAM" (MRN [id number]) as of 09/10/2019 13:56  Ref. Range 08/23/2019 10:53  FVC-Pre Latest Units: L 2.61  FVC-%Pred-Pre Latest Units: % 80  FEV1-Pre Latest Units: L 2.26  FEV1-%Pred-Pre Latest Units: % 92  Results for CHACE, BISCH "PAM" (MRN [id number]) as of 09/10/2019 13:56  Ref. Range 08/23/2019 10:53  DLCO cor Latest Units: ml/min/mmHg 13.76  DLCO cor % pred Latest Units: % 64   Results for MONROE, TOURE "PAM" (MRN [id number]) as of 09/10/2019 13:56  Ref. Range 08/23/2019 10:53  TLC Latest Units: L 4.15  TLC % pred Latest Units: % 75   xxxxxxxxxxxxxxxxxxxxxx  CT chest high resolution June 2021 Lungs/Pleura: Mild pulmonary fibrosis in a pattern with apical predominance, featuring irregular peripheral interstitial opacity and scattered areas of subpleural bronchiolectasis. Mild, tubular bronchiectasis throughout. No significant air trapping on expiratory phase imaging. No pleural effusion or pneumothorax.   Upper Abdomen: No acute abnormality.   Musculoskeletal: No chest wall mass or suspicious bone lesions identified.   IMPRESSION: 1. Mild pulmonary fibrosis in a pattern with apical predominance, featuring irregular peripheral interstitial opacity and scattered areas of subpleural bronchiolectasis. Mild, tubular bronchiectasis throughout. Findings are most consistent with an "alternative diagnosis" pattern by ATS pulmonary fibrosis criteria, leading differential considerations chronic fibrotic hypersensitivity pneumonitis or NSIP. 2. Prominent mediastinal lymph nodes, nonspecific and likely reactive. 3. Coronary artery disease.  Aortic Atherosclerosis (ICD10-I70.0).     Electronically Signed   By: Eddie Candle M.D.   On: 09/04/2019 15:37    xxxxxxxxxxxxxxxxxxxxxxxxxxxxxxxxxxxxxxxxxxxxxxxxxx  Results for REATA, PETROV "PAM" (MRN [id number]) as of  09/10/2019 13:56  Ref. Range 08/19/2019 16:30 08/19/2019 16:30  SEE BELOW Unknown  Comment  Anti Nuclear Antibody (ANA) Latest Ref Range: NEGATIVE  POSITIVE (A)   ANA Pattern 1 Unknown Cytoplasmic (A)   ANA Titer 1 Unknown 1:80 (H) Negative  Angiotensin-Converting Enzyme Latest Ref Range: 9 - 67 U/L 35   Anti JO-1 Latest Ref Range: 0.0 - 0.9 AI  <0.2  CENTROMERE AB SCREEN Latest Ref Range: 0.0 - 0.9 AI  <6.2  Cyclic Citrullin Peptide Ab Latest Units: UNITS <16   dsDNA Ab Latest Ref Range: 0 - 9 IU/mL  5  ENA RNP Ab Latest Ref Range: 0.0 - 0.9 AI  0.6  ENA SSA (RO) Ab Latest Ref Range: 0.0 - 0.9 AI  <0.2  ENA SSB (LA) Ab Latest Ref Range: 0.0 - 0.9 AI  <0.2  Myeloperoxidase Abs Latest Units: AI <1.0   Serine Protease 3 Latest Units: AI <1.0   RA Latex Turbid. Latest Ref Range: <14 IU/mL <14   ENA SM Ab Ser-aCnc Latest Ref Range: 0.0 - 0.9 AI  <0.2  Chromatin Ab SerPl-aCnc Latest Ref Range: 0.0 - 0.9 AI  <0.2     has a past medical history of Asthma, Depression, Emphysema of lung ([place]), Hemorrhoids, Hyperlipidemia, Hypertension, Hypothyroidism, Obesity, Rosacea, and Tubular adenoma of colon (03/26/2019).  IMPRESSIONS   ECHO 09/13/19  1. Left ventricular ejection fraction, by estimation, is 30 to 35%. The  left ventricle has moderately decreased function. The left ventricle  demonstrates global hypokinesis. Apex poorly visualized, consider repeat  limited echo with contrast to rule out  apical thrombus. The left ventricular internal cavity size was moderately  dilated. Left ventricular diastolic parameters are consistent with Grade I  diastolic dysfunction (impaired relaxation).   2.  Right ventricular systolic function is normal. The right ventricular  size is normal. Tricuspid regurgitation signal is inadequate for assessing  PA pressure.   3. The mitral valve is normal in structure. Trivial mitral valve  regurgitation.   4. The aortic valve was not well visualized. Aortic valve  regurgitation  is not visualized. No aortic stenosis is present.  CATH 8?20/21 Prox LAD to Mid LAD lesion is 30% stenosed. Ost Cx to Prox Cx lesion is 35% stenosed. Prox RCA lesion is 35% stenosed. There is moderate left ventricular systolic dysfunction. LV end diastolic pressure is normal. The left ventricular ejection fraction is 35-45% by visual estimate.   1. Nonobstructive CAD 2. Moderate LV dysfunction. EF estimated at 40% with global hypokinesis 3. Normal LV filling pressures 4. Normal right heart pressures 5. Normal cardiac output.    Plan: medical therapy for LV dysfunction and lipid lowering therapy for nonobstructive CAD.    OV 12/27/2019  Subjective:  Patient ID: Susan Hunt, female , DOB: 03-21-1945 , age 55 y.o. , MRN: [id number] , ADDRESS: [place] [address] PCP Leamon Arnt, MD Patient Care Team: Leamon Arnt, MD as PCP - General (Family Medicine) Elroy Channel, MD as Referring Physician (Internal Medicine) Rockey Situ as Consulting Physician (Obstetrics and Gynecology) Trinda Pascal, FNP as Consulting Physician (Urology) Brand Males, MD as Consulting Physician (Pulmonary Disease)  This Provider for this visit: Treatment Team:  Attending Provider: Brand Males, MD    12/27/2019 -   Chief Complaint  Patient presents with   Follow-up    discuss biopsy results. SOB sometimes. denies cough or wheezing.     HPI REINETTE CUNEO 75 y.o. --follow-up interstitial lung disease.  Had surgical biopsy December 13, 2019.  After that she is was in the hospital requiring oxygen but then got discharged without oxygen.  At this point time she is much improved but still having soreness from her chest incision site.  Husband and she are here.  They are concerned about some postoperative atelectasis that was seen.  I visualized the x-ray.  Today walking desaturation test is normal.  I reassured them about this.  But I  will give her incentive spirometer.  The main issue is to discuss the biopsy results: The bronchoalveolar lavage shows mixed cellularity with neutrophilia.  The surgical lung biopsy itself shows UIP with just 1 carcinoid tumor.  Reviewing her clinical history this UIP pattern is now consistent with IPF.  I gave her the diagnosis.  Regarding the carcinoid tumor let: I think this is an incidental finding.  There is no  DIPNECH syndrome  Her current symptom and walk profile are stable.   Results for TYJA, GORTNEY "PAM" (MRN [id number]) as of 12/27/2019 12:06  Ref. Range 12/13/2019 08:05  Color, Fluid Latest Ref Range: YELLOW  COLORLESS (A)  Total Nucleated Cell Count, Fluid Latest Ref Range: 0 - 1,000 cu mm 18  Lymphs, Fluid Latest Units: % 25  Eos, Fluid Latest Units: % 8  Appearance, Fluid Latest Ref Range: CLEAR  HAZY (A)  Other Cells, Fluid Latest Units: % MESOTHELIAL CELLS NOTED  Neutrophil Count, Fluid Latest Ref Range: 0 - 25 % 44 (H)  Monocyte-Macrophage-Serous Fluid Latest Ref Range: 50 - 90 % 23 (L)    OV 02/04/2020   Subjective:  Patient ID: Susan Hunt, female , DOB: 11-26-1945, age 40 y.o. years. , MRN: [id number],  ADDRESS: [place] [address] PCP  Jonni Sanger,  Karie Fetch, MD Providers : Treatment Team:  Attending Provider: Brand Males, MD Patient Care Team: Leamon Arnt, MD as PCP - General (Family Medicine) Elroy Channel, MD as Referring Physician (Internal Medicine) Rockey Situ as Consulting Physician (Obstetrics and Gynecology) Trinda Pascal, FNP as Consulting Physician (Urology) Brand Males, MD as Consulting Physician (Pulmonary Disease)    Chief Complaint  Patient presents with   Follow-up    ILD, doing well   Follow-up idiopathic pulmonary fibrosis diagnosed on surgical lung biopsy 12/13/2019.  Diagnosis formally given on 12/27/2019 Commenced pirfenidone late October 2021/early November  2021     HPI TAFFY DELCONTE 75 y.o. -returns for follow-up with her husband.  This visit is mainly to focus on pirfenidone uptake.  She has been taking pirfenidone for 3 weeks or more.  She is currently on full dose 3 pills 3 times a day.  The husband wants to know when we would roll to a full largest x1 pill 3 times daily.  I indicated to them that it would be 3 to 4 months of stability with pirfenidone before we made the decision.  Currently she is tolerating pirfenidone well.  She did have some mild nausea and she thinks it is because she did not take a full meal.  Overall she is handling pirfenidone well without any problems.  No fatigue no weight loss.  Respiratory wise she is stable.  She did get in touch with a local support group.  She is asking about pulmonary rehabilitation referral and she is interested in this.  One of the main issues they wanted to discuss was the fact the pirfenidone refill in the supply chain was not consistent.  There was anxiety that they were not going to get the refill.  She had to call the office.  She believes that it is because she made the call to the office that the refill was sent.  She says she tried to call [place] without any response for a long time.  I have indicated to her that I will ask our pharmacist to review and explained the logistics.  She will have a repeat LFT today      OV 04/02/2020  Subjective:  Patient ID: Susan Hunt, female , DOB: 03-Dec-1945 , age 15 y.o. , MRN: [id number] , ADDRESS: [place] [address] PCP Leamon Arnt, MD Patient Care Team: Leamon Arnt, MD as PCP - General (Family Medicine) Elroy Channel, MD as Referring Physician (Internal Medicine) Rockey Situ as Consulting Physician (Obstetrics and Gynecology) Trinda Pascal, FNP as Consulting Physician (Urology) Brand Males, MD as Consulting Physician (Pulmonary Disease)  This Provider for this visit: Treatment Team:   Attending Provider: Brand Males, MD    04/02/2020 -   Chief Complaint  Patient presents with   Follow-up    Pt states she has been doing well since last visit and denies any complaints.   Follow-up idiopathic pulmonary fibrosis diagnosed on surgical lung biopsy 12/13/2019.  Diagnosis formally given on 12/27/2019 Commenced pirfenidone late October 2021/early November 2021  HPI MACLOVIA UHER 75 y.o. -returns for follow-up.  Presents with her husband.  She is tolerating pirfenidone fine with just very mild occasional nausea.  She wants to roll over to the 1 pill 3 times daily.  I have sent a message through secure chat to the pharmacist.  She is going to start pulmonary rehabilitation in February 2022.  We discussed lung transplantation but at this  point we will hold off.  She will check liver function test today.    There are no new issues.  She is already touch base with the support group her symptom score shows stability.  Of note she has returned to portable oxygen because she feels she does not need it.  Walking desaturation test confirms that.    She is interested in clinical trials.  She is touch base with cardiology and looking at a heart failure trial there.  She is also given her name for pulmonary fibrosis trials but currently there is a backlog.  I then emailed Berneda Rose cardiology Geophysicist/field seismologist and she replied saying, "She was in our [facility] trial which her enrollment is complete. I am not aware of any other trial we are screening her for currently but will ask the team. I think IPF trial option would be in her best interest. I also think IPF will exclude her from our HF device trials.  "   PF   OV 07/23/2020  Subjective:  Patient ID: Susan Hunt, female , DOB: [birth year] , age 58 y.o. , MRN: [id number] , ADDRESS: [place] [id number] PCP Leamon Arnt, MD Patient Care Team: Leamon Arnt, MD as PCP - General (Family  Medicine) Elouise Munroe, MD as PCP - Cardiology (Cardiology) Elroy Channel, MD as Referring Physician (Internal Medicine) Rockey Situ as Consulting Physician (Obstetrics and Gynecology) Trinda Pascal, FNP as Consulting Physician (Urology) Brand Males, MD as Consulting Physician (Pulmonary Disease)  This Provider for this visit: Treatment Team:  Attending Provider: Brand Males, MD    07/23/2020 -   Chief Complaint  Patient presents with   Follow-up    IPF/ILD, denies changes   Follow-up idiopathic pulmonary fibrosis diagnosed on surgical lung biopsy 12/13/2019.  Diagnosis formally given on 12/27/2019 Commenced pirfenidone late October 2021/early November 2021. Screen failed for Pli[facility] Part D trial due to soc coreg intake - Spring 2022. IPF final dx on MDD Jul 21, 2020  HPI SAVAHNA CASADOS 75 y.o. -returns for follow-up.  At this point in time she is tolerating pirfenidone well.  In fact no side effects.  Her symptom score is stable.  She had high-resolution CT chest as part of a research protocol and there is no progressions in September 2021.  Her walking desaturation test shows a tendency to drop.  Is unclear if it is worse or the same because she walks faster than usual.  She feels stable.  However April 2022 she had recent PFTs FVC, 2.122, 71.6%, DLCO: 11.4, 54% -> this would suggest a decline.  Of note she try to qualify for the phase 2 study CALLED  Pliant..  However she is on carvedilol for hypertension control.  Hypertensive related to the reason of her cardiomyopathy.  Therefore she is screen failed.  She has good control of blood pressure with carvedilol.  She is interested in other research trials that she might qualify for.  Her husband Netanya Yazdani is with her today.        OV 01/04/2021  Subjective:  Patient ID: Susan Hunt, female , DOB: [birth year] , age 37 y.o. , MRN: [id number] , ADDRESS: [address] [place]  [id number] PCP Leamon Arnt, MD Patient Care Team: Leamon Arnt, MD as PCP - General (Family Medicine) Elouise Munroe, MD as PCP - Cardiology (Cardiology) Elroy Channel, MD as Referring Physician (Internal Medicine) Rockey Situ as Consulting Physician (Obstetrics  and Gynecology) Trinda Pascal, FNP as Consulting Physician (Urology) Brand Males, MD as Consulting Physician (Pulmonary Disease)  This Provider for this visit: Treatment Team:  Attending Provider: Brand Males, MD    01/04/2021 -   Chief Complaint  Patient presents with   Follow-up    Pt states she had a spell 4 weeks ago where her BP dropped too low to where she passed out and fell into a dresser which caused her to break some ribs. Pt said since this happened, she has had a little increased SOB as she is not able to take a deep breath.   Follow-up idiopathic pulmonary fibrosis diagnosed on surgical lung biopsy 12/13/2019.  Diagnosis formally given on 12/27/2019 Commenced pirfenidone late October 2021/early November 2021. Screen failed for Pliant Part D trial due to soc coreg intake - Spring 2022. IPF final dx on MDD Jul 21, 2020  HPI AVALYN MOLINO 75 y.o. -returns for follow-up.  Last seen in late spring early summer 2022.  She presents with her husband Merry Proud.  She tells me that in August 2022 she broke her right tibia after having a slip and fall on the wet floor.  She then was seen by Dr. Mardelle Matte.  She was in a cast.  Then the cast came off then a few weeks later which was approximately 4 weeks ago in September 2022 she was getting out of bed and then fell down and sustained multiple rib fractures not otherwise specified.  She does not know which rib fractures.  She has not had a CT to confirm rib fracture but only had a chest x-ray.  Her husband believes this is because of orthostasis from her antihypertensives.  Most recently she is seeing cardiology Dr. Margaretann Loveless and has been referred to  the heart failure service.  She also tells me that Dr. Mardelle Matte told her that the chest x-ray suggested she might need thoracentesis and that one of the reason she is here today.  I am not able to visualize a chest x-ray the last chest x-ray was in July 2022 that I personally visualized and this showed pulmonary fibrosis..  At this point in time she is having significant symptoms because of the rib fracture particularly in terms of pain.  Her pain level is 4 out of 5.  This is preventing her from walking and mobilizing well.  She also believe she has gained 15 pounds of weight after starting the pirfenidone.  She says this weight gain happened before the rib fractures.  She is surprised because most often pirfenidone causes weight loss she believes the weight gain is because of pirfenidone.  She believes her thyroid function was normal.  Review of the chart indicates 8 pound weight gain happening mostly between January 2022 and May 2022.  Husband also acknowledges that she is more sedentary  Symptom score shows worsening.  Walking desaturation test is documented below -typically she will be able to complete 3 laps and desaturate 7 8 points although still adequate.  Today she stopped it 2 laps because of shortness of breath.  She did not even have a tachycardic response.  She only dropped 3 points pulse ox.  All of this is is more consistent with deconditioning and splinting from rib fracture rather than pulmonary fibrosis getting worse.     SYMPTOM SCALE - ILD.td  08/19/2019  11/12/2019  12/27/2019 Last Weight  Most recent update: 12/27/2019 11:48 AM     Weight  89.7 kg (197 lb 12.8  oz)             02/04/2020 196#  04/02/2020 197# 07/23/2020 204# 01/04/2021 203#  O2 use _0  ra ra  Shortness of Breath 0 -> 5 scale with 5 being worst (score 6 If unable to do)        At rest 1 1 0 0 0 1 2  Simple tasks - showers, clothes change, eating, shaving _1 Household (dishes, doing  bed, laundry) _2 Shopping _3 Walking level at own pace _4 0 1 2  Walking up Stairs _5 Total (30-36) Dyspnea Score _6 How bad is your cough? x 2 0 0 0 0 0  0H0o0w bad is your fatigue x _7 0 2  How bad is nausea x 1 0 1 1 0 0  How bad is vomiting?  x 1 0 0 0 0 0  How bad is diarrhea? x 0 0 0 0 00 4  How bad is anxiety? x 0 0 0 0 0 0  How bad is depression x 0 0 0 0 0 0  Chronic pain       4     Simple office walk 185 feet x  3 laps goal with forehead probe 08/19/2019  11/12/2019  12/27/19 BRL 04/02/2020 gso - she has returned her portabl o2 07/23/2020  01/04/2021   O2 used _8  ra  Number laps completed Attempted 3 but desaturated at 2 Did alll 3 on this repeat walk - on beta blocker    2 of 3 laps  Comments about pace Normal pace  Nl pace avg avg slow  Resting Pulse Ox/HR 100% and 101/min 96% and 60/min 94% and 71/min 100% and 60 99% and 76 99% and HR  73  Final Pulse Ox/HR 86% and 116/min 94% and 80/min 96% and 87/min 94% ns 79 90% and HR 84 96% and HR 89  Desaturated </= 88% yes no   no no  Desaturated <= 3% points Yes , 14 no   Yes, 9 poins Yes, 3 points  Got Tachycardic >/= 90/min yes no   no Modrae dyspnea  Symptoms at end of test dyspneic mild none noon no   Miscellaneous comments Corrected with  2L [place] Will return o2 back to dme   no deconditined   Results for LYNANNE, DELGRECO "PAM" (MRN [id number]) as of 01/04/2021 15:19  Ref. Range 12/11/2020 12:08  AST Latest Ref Range: 10 - 35 U/L 20  ALT Latest Ref Range: 6 - 29 U/L 16    PFT  PFT Results Latest Ref Rng & Units 08/23/2019  FVC-Pre L 2.61  FVC-Predicted Pre % 80  FVC-Post L 2.57  FVC-Predicted Post % 79  Pre FEV1/FVC % % 87  Post FEV1/FCV % % 92  FEV1-Pre L 2.26  FEV1-Predicted Pre % 92  FEV1-Post L 2.36  DLCO uncorrected ml/min/mmHg 13.84  DLCO UNC% % 65  DLCO corrected ml/min/mmHg 13.76  DLCO COR %Predicted % 64  DLVA Predicted % 88   TLC L 4.15  TLC % Predicted % 75  RV % Predicted % 63       has a past medical history of Anemia, Asthma, CHF (congestive heart failure) ([place]), Depression, Emphysema of  lung ([place]), GERD (gastroesophageal reflux disease), Hemorrhoids, History of kidney stones, Hyperlipidemia, Hypertension, Hypothyroidism, Obesity, Pre-diabetes, Rosacea, and Tubular adenoma of colon (03/26/2019).   reports that she quit smoking about 7 years ago. Her smoking use included cigarettes. She has a 7.50 pack-year smoking history. She has never used smokeless tobacco.  Past Surgical History:  Procedure Laterality Date   ABDOMINAL HYSTERECTOMY     ANTERIOR AND POSTERIOR VAGINAL REPAIR  2020   CARDIAC CATHETERIZATION  11/01/2019   CATARACT EXTRACTION W/ INTRAOCULAR LENS IMPLANT Bilateral 2016   COLONOSCOPY     CYSTOSCOPY     DENTAL SURGERY  2016   Dental Implant   EYE SURGERY Bilateral    cataract   FRACTURE SURGERY Left 2013   Left ankle   INTERCOSTAL NERVE BLOCK Right 12/13/2019   Procedure: INTERCOSTAL NERVE BLOCK;  Surgeon: Melrose Nakayama, MD;  Location: [facility];  Service: Thoracic;  Laterality: Right;   LUNG BIOPSY Right 12/13/2019   Procedure: LUNG BIOPSY;  Surgeon: Melrose Nakayama, MD;  Location: [facility];  Service: Thoracic;  Laterality: Right;   LYMPH NODE BIOPSY Right 12/13/2019   Procedure: LYMPH NODE BIOPSY;  Surgeon: Melrose Nakayama, MD;  Location: [facility];  Service: Thoracic;  Laterality: Right;   medtronix nerve stimulator  07/18/2019   Complete InterStim Sacral Neuromudulation system implantation 07/18/19, [facility]   NASAL SINUS SURGERY     RIGHT/LEFT HEART CATH AND CORONARY ANGIOGRAPHY N/A 11/01/2019   Procedure: RIGHT/LEFT HEART CATH AND CORONARY ANGIOGRAPHY;  Surgeon: Martinique, Peter M, MD;  Location: [facility];  Service: Cardiovascular;  Laterality: N/A;   VAGINECTOMY, PARTIAL     VIDEO BRONCHOSCOPY N/A 12/13/2019   Procedure: VIDEO BRONCHOSCOPY;  Surgeon: Melrose Nakayama, MD;  Location: [facility];  Service: Thoracic;  Laterality: N/A;    Allergies  Allergen Reactions   Ace Inhibitors Cough   Clarithromycin     REACTION: GI sx   Codeine Nausea Only   Telmisartan Other (See Comments)    sleepy    Immunization History  Administered Date(s) Administered   Fluad Quad(high Dose 65+) 12/09/2019, 12/11/2020   Hepatitis A, Adult 04/06/2018   Influenza Split 01/06/2009   Influenza, High Dose Seasonal PF 12/17/2014, 12/20/2015, 01/15/2017, 12/18/2017, 11/28/2018   Influenza, Seasonal, Injecte, Preservative Fre 01/07/2014, 12/17/2014   Influenza,inj,Quad PF,6+ Mos 01/15/2017   Influenza,trivalent, recombinat, inj, PF 12/03/2012   Influenza-Unspecified 01/15/2017   Moderna SARS-COV2 Booster Vaccination 01/07/2020   Moderna Sars-Covid-2 Vaccination 04/17/2019, 05/15/2019, 08/20/2020   Pneumococcal Conjugate-13 01/07/2014   Pneumococcal Polysaccharide-23 11/23/2011   Td 03/14/2006, 10/16/2011   Zoster Recombinat (Shingrix) 10/21/2017, 12/18/2017   Zoster, Live 12/06/2013    Family History  Problem Relation Age of Onset   Heart disease Mother    Hypertension Mother    Osteoporosis Mother    Stroke Mother    Alcohol abuse Father    Diabetes Maternal Grandmother    Drug abuse Son    Breast cancer Neg Hx      Current Outpatient Medications:    amLODipine (NORVASC) 5 MG tablet, TAKE ONE TABLET BY MOUTH DAILY, Disp: 90 tablet, Rfl: 3   carvedilol (COREG) 25 MG tablet, Take 1 tablet (25 mg total) by mouth 2 (two) times daily., Disp: 180 tablet, Rfl: 3   cetirizine (ZYRTEC) 10 MG tablet, Take 10 mg by mouth daily., Disp: , Rfl:    FARXIGA 10 MG TABS tablet, Take 10 mg by mouth daily., Disp: , Rfl:    fluticasone (FLONASE)  50 MCG/ACT nasal spray, Place 2 sprays into both nostrils at bedtime as needed (allergies.). , Disp: , Rfl:    levothyroxine (SYNTHROID) 125 MCG tablet, TAKE ONE TABLET BY MOUTH DAILY, Disp: 90 tablet, Rfl: 0   montelukast (SINGULAIR) 10  MG tablet, TAKE ONE TABLET BY MOUTH EVERY NIGHT AT BEDTIME, Disp: 90 tablet, Rfl: 1   Multiple Vitamin (MULTIVITAMIN WITH MINERALS) TABS tablet, Take 1 tablet by mouth daily., Disp: , Rfl:    omeprazole (PRILOSEC) 20 MG capsule, Take 1 capsule (20 mg total) by mouth daily., Disp: 90 capsule, Rfl: 3   ondansetron (ZOFRAN-ODT) 4 MG disintegrating tablet, DISSOLVE ONE TABLET BY MOUTH EVERY 8 HOURS AS NEEDED FOR NAUSEA/ VOMITING, Disp: 20 tablet, Rfl: 0   Pirfenidone (ESBRIET) 801 MG TABS, Take 801 mg by mouth with breakfast, with lunch, and with evening meal., Disp: 90 tablet, Rfl: 11   rosuvastatin (CRESTOR) 10 MG tablet, TAKE ONE TABLET BY MOUTH DAILY, Disp: 90 tablet, Rfl: 3   valsartan (DIOVAN) 320 MG tablet, Take 1 tablet (320 mg total) by mouth daily., Disp: 90 tablet, Rfl: 3   venlafaxine XR (EFFEXOR-XR) 150 MG 24 hr capsule, Take 1 capsule (150 mg total) by mouth daily., Disp: 90 capsule, Rfl: 3      Objective:   Vitals:   01/04/21 1508  BP: 114/62  Pulse: 72  Temp: 97.8 F (36.6 C)  TempSrc: Oral  SpO2: 96%  Weight: 203 lb 3.2 oz (92.2 kg)  Height: _0  (1.702 m)    Estimated body mass index is 31.83 kg/m as calculated from the following:   Height as of this encounter: _1  (1.702 m).   Weight as of this encounter: 203 lb 3.2 oz (92.2 kg).  _2 @  Filed Weights   01/04/21 1508  Weight: 203 lb 3.2 oz (92.2 kg)     Physical Exam  General: No distress. Looks well Neuro: Alert and Oriented x 3. GCS 15. Speech normal Psych: Pleasant Resp:  Barrel Chest - no.  Wheeze - no, Crackles - yes bilaterally, No overt respiratory distress CVS: Normal heart sounds. Murmurs - no Ext: Stigmata of Connective Tissue Disease - no HEENT: Normal upper airway. PEERL +. No post nasal drip  She had difficulty taking a deep breath because of splinting from rib fracture      Assessment:       ICD-10-CM   1. IPF (idiopathic pulmonary fibrosis) ([place])  J84.112 DG Chest 2 View     2. Encounter for therapeutic drug monitoring  Z51.81     3. History of rib fracture  Z87.81 DG Chest 2 View    4. Dyspnea on exertion  R06.09     5. Weight gain  R63.5      She has worsening symptoms.  Her weight has been stable although she gained weight earlier in the year.  She is having severe pain and splinting because of rib fracture.  She stopped walking at 2 laps and did not mount a heart rate response and barely dropped pulse ox to 3 points.  All of this suggesting rib fracture and deconditioning and splinting as reasons for worsening dyspnea.  I do not think the pulmonary fibrosis itself is worse.  Currently pain is too significant for her to participate in PFT.  Need to sort out for this any pleural effusion at hemothorax buildup because of the rib fracture.  We will get a chest x-ray. Plan:     Patient Instructions  IPF (idiopathic  pulmonary fibrosis) ([place]) Encounter for therapeutic drug monitoring History of rib fracture - sept 2022 Dyspnea on exertion   Quite possible the IPF itself is stable Quite possible that rib fracture and associated pain is making you more short of breath  Plan  - hold off PFT testing due to rib fracture recently  - do simple walking desaturation test  - continue esbriet (LFT normal sept 2022) as before - hold off research study for the moment - do CXR 2 view 01/04/2021 - to ensure no complications from rib fracture - For rib fracture complication prevention management - do incentive spirometer (Can get on [place]) to open up your lungs and avoid atelectasis  - pain control per PCP or ortho Dr Mardelle Matte   Weight gain  - weight gain ins 8 # in 1 year according to pur chart and most of this happened between Jan 2022 -> May 2022 and since then stable  Plan  - monitor  Followup  - will call wth cxr results  =- 4-8 weeks with Dr Chase Caller on 30 min visit - to ensure stability  - Symptoms score and walk test at followup  ( Level 05 visit:  Estb 40-54 min   in  visit type: on-site physical face to visit  in total care time and counseling or/and coordination of care by this undersigned MD - Dr Brand Males. This includes one or more of the following on this same day 01/04/2021: pre-charting, chart review, note writing, documentation discussion of test results, diagnostic or treatment recommendations, prognosis, risks and benefits of management options, instructions, education, compliance or risk-factor reduction. It excludes time spent by the [place] or office staff in the care of the patient. Actual time 10 min)  SIGNATURE    Dr. Brand Males, M.D., F.C.C.P,  Pulmonary and Critical Care Medicine Staff Physician, [place] Director - Interstitial Lung Disease  Program  [facility] [place] [facility] [place], [place], [address]  Pager: [phone], If no answer or between  15:00h - 7:00h: call [phone] Telephone: [phone]  3:59 PM 01/04/2021

## 2021-01-04 NOTE — Patient Instructions (Signed)
IPF (idiopathic pulmonary fibrosis) ([place]) Encounter for therapeutic drug monitoring History of rib fracture - sept 2022 Dyspnea on exertion   Quite possible the IPF itself is stable Quite possible that rib fracture and associated pain is making you more short of breath  Plan  - hold off PFT testing due to rib fracture recently  - do simple walking desaturation test  - continue esbriet (LFT normal sept 2022) as before - hold off research study for the moment - do CXR 2 view 01/04/2021 - to ensure no complications from rib fracture - For rib fracture complication prevention management - do incentive spirometer (Can get on [place]) to open up your lungs and avoid atelectasis  - pain control per PCP or ortho Dr Mardelle Matte   Weight gain  - weight gain ins 8 # in 1 year according to pur chart and most of this happened between Jan 2022 -> May 2022 and since then stable  Plan  - monitor  Followup  - will call wth cxr results  =- 4-8 weeks with Dr Chase Caller on 30 min visit - to ensure stability  - Symptoms score and walk test at followup

## 2021-01-07 ENCOUNTER — Telehealth: Payer: Self-pay | Admitting: Primary Care

## 2021-01-07 NOTE — Telephone Encounter (Signed)
Profitt, Veronia Beets  [facility] Triage Pool 9 hours ago (8:20 AM)   Please advise on x-ray results.       Nadara Mustard"  Patient Appointment Schedule Request Pool Yesterday (5:11 PM)   Good morning.  I have not heard from the x-ray and the results are not in my chart.

## 2021-01-08 NOTE — Telephone Encounter (Signed)
Patient is aware of results and voiced her understanding.  Patient will contact ortho.  Nothing further needed.

## 2021-01-08 NOTE — Telephone Encounter (Signed)
  Sorry for delay - main issue - see red - new spine fracture since apirl 2022  Plan  - she should talk to her ortho or pcp Leamon Arnt, MD about it   DG Chest 2 View  Result Date: 01/07/2021 CLINICAL DATA:  Idiopathic pulmonary fibrosis and dyspnea. History of rib fracture. EXAM: CHEST - 2 VIEW COMPARISON:  06/25/2020 FINDINGS: The heart size and mediastinal contours are within normal limits. Bilateral peripheral predominant interstitial reticular opacities are again noted compatible with the history of interstitial lung disease. No pleural effusion or edema. No superimposed airspace consolidation. New compression deformity within the midthoracic spine is identified with loss of approximately 50% of the vertebral body height. Spondylosis identified in the thoracic spine. IMPRESSION: 1. Chronic interstitial changes compatible with chronic fibrotic interstitial lung disease. 2. No acute cardiopulmonary abnormalities. 3. New midthoracic spine compression deformity with loss of approximately 50% of the vertebral body height. . Electronically Signed   By: Kerby Moors M.D.   On: 01/07/2021 09:51

## 2021-01-11 DIAGNOSIS — J849 Interstitial pulmonary disease, unspecified: Secondary | ICD-10-CM

## 2021-01-12 ENCOUNTER — Other Ambulatory Visit: Payer: Self-pay

## 2021-01-12 ENCOUNTER — Ambulatory Visit (INDEPENDENT_AMBULATORY_CARE_PROVIDER_SITE_OTHER): Payer: Medicare Other | Admitting: Family Medicine

## 2021-01-12 ENCOUNTER — Encounter: Payer: Self-pay | Admitting: Family Medicine

## 2021-01-12 VITALS — BP 142/73 | HR 70 | Temp 97.9°F | Wt 202.2 lb

## 2021-01-12 DIAGNOSIS — M85859 Other specified disorders of bone density and structure, unspecified thigh: Secondary | ICD-10-CM | POA: Diagnosis not present

## 2021-01-12 DIAGNOSIS — S2249XD Multiple fractures of ribs, unspecified side, subsequent encounter for fracture with routine healing: Secondary | ICD-10-CM | POA: Diagnosis not present

## 2021-01-12 DIAGNOSIS — S22000D Wedge compression fracture of unspecified thoracic vertebra, subsequent encounter for fracture with routine healing: Secondary | ICD-10-CM

## 2021-01-12 MED ORDER — PIRFENIDONE 801 MG PO TABS: 801.0000 mg | ORAL_TABLET | Freq: Three times a day (TID) | ORAL | 11 refills | Status: DC

## 2021-01-12 MED ORDER — TRAMADOL HCL 50 MG PO TABS: 50.0000 mg | ORAL_TABLET | Freq: Three times a day (TID) | ORAL | 0 refills | Status: DC | PRN

## 2021-01-12 NOTE — Patient Instructions (Signed)
Please return in 3-4 weeks for recheck of your compression fracture.   If you have any questions or concerns, please don't hesitate to send me a message via MyChart or call the office at [phone]. Thank you for visiting with [place] today! It's our pleasure caring for you.   Spinal Compression Fracture A spinal compression fracture is a collapse of the bones that form the spine (vertebrae). With this type of fracture, the vertebrae become pushed (compressed) into a wedge shape. Most compression fractures happen in the middle or lower part of the spine. What are the causes? This condition may be caused by: Thinning and loss of density in the bones (osteoporosis). This is the most common cause. A fall. A car or motorcycle accident. Cancer. Trauma, such as a heavy, direct hit to the head or back. What increases the risk? You are more likely to develop this condition if: You are 18 years of age or older. You have osteoporosis. You have certain types of cancer, including: Multiple myeloma. Lymphoma. Prostate cancer. Lung cancer. Breast cancer. What are the signs or symptoms? Symptoms of this condition include: Severe pain with simple movements such as coughing or sneezing. Pain that gets worse over time. Pain that is worse when you stand, walk, sit, or bend. Sudden pain that is so bad that it is hard for you to move. Bending or humping of the spine. Gradual loss of height. Numbness, tingling, or weakness in the back and legs. Trouble walking. Your symptoms will depend on the cause of the fracture and how quickly it develops. How is this diagnosed? This condition may be diagnosed based on symptoms, medical history, and a physical exam. During the physical exam, your health care provider may tap along the length of your spine to check for tenderness. Tests may be done to confirm the diagnosis. They may include: A bone mineral density test to check for osteoporosis. Imaging tests, such as  a spine X-ray, CT scan, or MRI. How is this treated? Treatment depends on the cause and severity of the condition. Some fractures may heal on their own with supportive care. Treatment may include: Pain medicine. Rest. A back brace. Physical therapy exercises. Medicine to strengthen bone. Calcium and vitamin D supplements. Fractures that cause the back to become misshapen, cause nerve pain or weakness, or do not respond to other treatment may be treated with surgery. This may include: Vertebroplasty. Bone cement is injected into the collapsed vertebrae to stabilize them. Balloon kyphoplasty. The collapsed vertebrae are expanded with a balloon and then bone cement is injected into them. Spinal fusion. The collapsed vertebrae are connected (fused) to normal vertebrae. Follow these instructions at home: Medicines Take over-the-counter and prescription medicines only as told by your health care provider. Ask your health care provider if the medicine prescribed to you: Requires you to avoid driving or using machinery. Can cause constipation. You may need to take these actions to prevent or treat constipation: Drink enough fluid to keep your urine pale yellow. Take over-the-counter or prescription medicines. Eat foods that are high in fiber, such as beans, whole grains, and fresh fruits and vegetables. Limit foods that are high in fat and processed sugars, such as fried or sweet foods. If you have a brace: Wear the brace as told by your health care provider. Remove it only as told by your health care provider. Loosen the brace if your fingers or toes tingle, become numb, or turn cold and blue. Keep the brace clean. If the  brace is not waterproof: Do not let it get wet. Cover it with a watertight covering when you take a bath or a shower. Managing pain, stiffness, and swelling  If directed, put ice on the injured area. To do this: If you have a removable brace, remove it as told by your  health care provider. Put ice in a plastic bag. Place a towel between your skin and the bag. Leave the ice on for 20 minutes, 2-3 times a day. Remove the ice if your skin turns bright red. This is very important. If you cannot feel pain, heat, or cold, you have a greater risk of damage to the area. Activity Rest as told by your health care provider. Avoid sitting for a long time without moving. Get up to take short walks every 1-2 hours. This is important to improve blood flow and breathing. Ask for help if you feel weak or unsteady. Return to your normal activities as told by your health care provider. Ask what activities are safe for you. Do physical therapy exercises to improve movement and strength in your back, as recommended by your health care provider. Exercise regularly as directed by your health care provider. General instructions  Do not drink alcohol. Alcohol can interfere with your treatment. Do not use any products that contain nicotine or tobacco, such as cigarettes, e-cigarettes, and chewing tobacco. These can delay bone healing. If you need help quitting, ask your health care provider. Keep all follow-up visits. This is important. It can help to prevent permanent injury, disability, and long-lasting (chronic) pain. Contact a health care provider if: You have a fever. Your pain medicine is not helping. Your pain does not get better over time. You cannot return to your normal activities as planned or expected. Get help right away if: Your pain is very bad and it suddenly gets worse. You are unable to move any body part (paralysis) that is below the level of your injury. You have numbness, tingling, or weakness in any body part that is below the level of your injury. You cannot control your bladder or bowels. Summary A spinal compression fracture is a collapse of the bones that form the spine (vertebrae). With this type of fracture, the vertebrae become pushed (compressed)  into a wedge shape. Your symptoms and treatment will depend on the cause and severity of the fracture and how quickly it develops. Some fractures may heal on their own with supportive care. Fractures that cause the back to become misshapen, cause nerve pain or weakness, or do not respond to other treatment may be treated with surgery. This information is not intended to replace advice given to you by your health care provider. Make sure you discuss any questions you have with your health care provider. Document Revised: 06/19/2019 Document Reviewed: 06/19/2019 Elsevier Patient Education  [place].

## 2021-01-12 NOTE — Progress Notes (Signed)
Subjective  CC:  Chief Complaint  Patient presents with   Back Pain    Pt rated pain today 4-5. Taking Naproxen, pt stated it doesn't help her pain.    HPI: Susan Hunt is a 75 y.o. female who presents to the office today to address the problems listed above in the chief complaint. 75 yo: has had difficult course with back pain: in review: fell in late September: had syncopal episode due to low blood pressure falling into dresser: reports moved dresser several feet so was very traumatic fall. Days later went ot urgent care ([facility]) and got xrays. Was told 2 days later she had rib fractures. Was only given naproxyn.  Since, has had persistent disabling pain. Had f/u with Dr. Mardelle Matte on 10/5; notes reviewed. Was f/u for her ankle fracture but cxr was repeated "Possible rib frx" per Dr. Luanna Cole read. Pt reports she has had significant back pain that has worsened. Limiting activities: can't bend forward. Has had worsening sob as well. Had f/u with pulm for ILD: I reviewed recent notes: sob related to deconditioning and splinting but ILD is stable. Cxr taken again and now clearly has thoracic compression fracture 50% loss of height.  Osteopenia: reviewed last dexa 06/2018  Assessment  1. Compression fracture of thoracic vertebra with routine healing, unspecified thoracic vertebral level, subsequent encounter   2. Closed fracture of multiple ribs with routine healing, unspecified laterality, subsequent encounter      Plan  Compression fracture, spine, aobut 5-6 weeks out:  education given. Traumatic. Needs pain mgt. Start tramadol and recheck in 3-4 weeks. If not improving, will get CT to better assess and NS/ortho eval. Pt agrees.  Get records from [facility] to clarify rib fractures.  Will need a repeat dexa to assess for osteoporosis  Follow up: 3-4 weeks to recheck.  06/14/2021  No orders of the defined types were placed in this encounter.  Meds ordered this encounter   Medications   traMADol (ULTRAM) 50 MG tablet    Sig: Take 1 tablet (50 mg total) by mouth every 8 (eight) hours as needed for moderate pain.    Dispense:  60 tablet    Refill:  0      I reviewed the patients updated PMH, FH, and SocHx.    Patient Active Problem List   Diagnosis Date Noted   Interstitial lung disease ([place]) 11/01/2019    Priority: High   HFrEF (heart failure with reduced ejection fraction) ([place]) 11/01/2019    Priority: High   Controlled type 2 diabetes mellitus without complication, without long-term current use of insulin ([place]) 07/19/2019    Priority: High   Tubular adenoma of colon 03/26/2019    Priority: High   Panlobular emphysema ([place]) 06/17/2014    Priority: High   Major depression, chronic 01/28/2014    Priority: High   Obesity (BMI 30.0-34.9) 07/15/2013    Priority: High   Acquired hypothyroidism 04/11/2007    Priority: High   Mixed hyperlipidemia 04/11/2007    Priority: High   Essential hypertension 04/11/2007    Priority: High   Recurrent UTI 04/12/2019    Priority: Medium    Hepatic steatosis 03/26/2019    Priority: Medium    Osteopenia of femoral neck 05/24/2016    Priority: Medium    RENAL CALCULUS, HX OF 04/11/2007    Priority: Medium    ACE-inhibitor cough 10/02/2017    Priority: Low   Postmenopausal symptoms 02/14/2017    Priority: Low  Grade II hemorrhoids 05/03/2016    Priority: Low   Snoring 05/06/2014    Priority: Low   Allergic rhinitis 12/18/2009    Priority: Low   Rosacea 04/11/2007    Priority: Low   URINARY INCONTINENCE 04/11/2007    Priority: Low   Research study patient 06/24/2020   Chronic vertigo 07/19/2019   Current Meds  Medication Sig   amLODipine (NORVASC) 5 MG tablet TAKE ONE TABLET BY MOUTH DAILY   carvedilol (COREG) 25 MG tablet Take 1 tablet (25 mg total) by mouth 2 (two) times daily.   cetirizine (ZYRTEC) 10 MG tablet Take 10 mg by mouth daily.   FARXIGA 10 MG TABS tablet Take 10 mg by mouth daily.    fluticasone (FLONASE) 50 MCG/ACT nasal spray Place 2 sprays into both nostrils at bedtime as needed (allergies.).    levothyroxine (SYNTHROID) 125 MCG tablet TAKE ONE TABLET BY MOUTH DAILY   montelukast (SINGULAIR) 10 MG tablet TAKE ONE TABLET BY MOUTH EVERY NIGHT AT BEDTIME   Multiple Vitamin (MULTIVITAMIN WITH MINERALS) TABS tablet Take 1 tablet by mouth daily.   omeprazole (PRILOSEC) 20 MG capsule Take 1 capsule (20 mg total) by mouth daily.   ondansetron (ZOFRAN-ODT) 4 MG disintegrating tablet DISSOLVE ONE TABLET BY MOUTH EVERY 8 HOURS AS NEEDED FOR NAUSEA/ VOMITING   Pirfenidone (ESBRIET) 801 MG TABS Take 801 mg by mouth with breakfast, with lunch, and with evening meal.   rosuvastatin (CRESTOR) 10 MG tablet TAKE ONE TABLET BY MOUTH DAILY   traMADol (ULTRAM) 50 MG tablet Take 1 tablet (50 mg total) by mouth every 8 (eight) hours as needed for moderate pain.   valsartan (DIOVAN) 320 MG tablet Take 1 tablet (320 mg total) by mouth daily.   venlafaxine XR (EFFEXOR-XR) 150 MG 24 hr capsule Take 1 capsule (150 mg total) by mouth daily.    Allergies: Patient is allergic to ace inhibitors, clarithromycin, codeine, and telmisartan. Family History: Patient family history includes Alcohol abuse in her father; Diabetes in her maternal grandmother; Drug abuse in her son; Heart disease in her mother; Hypertension in her mother; Osteoporosis in her mother; Stroke in her mother. Social History:  Patient  reports that she quit smoking about 7 years ago. Her smoking use included cigarettes. She has a 7.50 pack-year smoking history. She has never used smokeless tobacco. She reports that she does not currently use alcohol after a past usage of about 1.0 standard drink per week. She reports that she does not use drugs.  Review of Systems: Constitutional: Negative for fever malaise or anorexia Cardiovascular: negative for chest pain Respiratory: negative for SOB or persistent cough Gastrointestinal:  negative for abdominal pain  Objective  Vitals: BP (!) 142/73   Pulse 70   Temp 97.9 F (36.6 C) (Temporal)   Wt 202 lb 3.2 oz (91.7 kg)   SpO2 97%   BMI 31.67 kg/m  General: no acute distress , A&Ox3 No respiratory distress    Commons side effects, risks, benefits, and alternatives for medications and treatment plan prescribed today were discussed, and the patient expressed understanding of the given instructions. Patient is instructed to call or message via MyChart if he/she has any questions or concerns regarding our treatment plan. No barriers to understanding were identified. We discussed Red Flag symptoms and signs in detail. Patient expressed understanding regarding what to do in case of urgent or emergency type symptoms.  Medication list was reconciled, printed and provided to the patient in AVS. Patient instructions and summary  information was reviewed with the patient as documented in the AVS. This note was prepared with assistance of Dragon voice recognition software. Occasional wrong-word or sound-a-like substitutions may have occurred due to the inherent limitations of voice recognition software  This visit occurred during the SARS-CoV-2 public health emergency.  Safety protocols were in place, including screening questions prior to the visit, additional usage of staff PPE, and extensive cleaning of exam room while observing appropriate contact time as indicated for disinfecting solutions.

## 2021-01-14 NOTE — Telephone Encounter (Signed)
LVM to get scheduled.

## 2021-01-14 NOTE — Telephone Encounter (Signed)
Please get patient scheduled for tomorrow, double book

## 2021-01-15 ENCOUNTER — Ambulatory Visit (HOSPITAL_COMMUNITY)
Admission: RE | Admit: 2021-01-15 | Discharge: 2021-01-15 | Disposition: A | Discharge status: Home / Self Care | Payer: Medicare Other | Source: Ambulatory Visit | Attending: Internal Medicine | Admitting: Internal Medicine

## 2021-01-15 ENCOUNTER — Other Ambulatory Visit: Payer: Self-pay

## 2021-01-15 VITALS — BP 130/80 | HR 73 | Ht 67.0 in | Wt 202.4 lb

## 2021-01-15 DIAGNOSIS — I2510 Atherosclerotic heart disease of native coronary artery without angina pectoris: Secondary | ICD-10-CM | POA: Diagnosis not present

## 2021-01-15 DIAGNOSIS — I110 Hypertensive heart disease with heart failure: Secondary | ICD-10-CM | POA: Diagnosis not present

## 2021-01-15 DIAGNOSIS — Z87891 Personal history of nicotine dependence: Secondary | ICD-10-CM | POA: Diagnosis not present

## 2021-01-15 DIAGNOSIS — R55 Syncope and collapse: Secondary | ICD-10-CM | POA: Diagnosis not present

## 2021-01-15 DIAGNOSIS — I10 Essential (primary) hypertension: Secondary | ICD-10-CM | POA: Diagnosis not present

## 2021-01-15 DIAGNOSIS — Z79899 Other long term (current) drug therapy: Secondary | ICD-10-CM | POA: Diagnosis not present

## 2021-01-15 DIAGNOSIS — Z7901 Long term (current) use of anticoagulants: Secondary | ICD-10-CM | POA: Diagnosis not present

## 2021-01-15 DIAGNOSIS — J84112 Idiopathic pulmonary fibrosis: Secondary | ICD-10-CM | POA: Diagnosis not present

## 2021-01-15 DIAGNOSIS — I5022 Chronic systolic (congestive) heart failure: Secondary | ICD-10-CM | POA: Insufficient documentation

## 2021-01-15 DIAGNOSIS — E785 Hyperlipidemia, unspecified: Secondary | ICD-10-CM | POA: Diagnosis not present

## 2021-01-15 DIAGNOSIS — I5020 Unspecified systolic (congestive) heart failure: Secondary | ICD-10-CM

## 2021-01-15 DIAGNOSIS — J439 Emphysema, unspecified: Secondary | ICD-10-CM | POA: Insufficient documentation

## 2021-01-15 DIAGNOSIS — I428 Other cardiomyopathies: Secondary | ICD-10-CM | POA: Diagnosis not present

## 2021-01-15 NOTE — Patient Instructions (Signed)
No Labs done today.   No medication changes were made. Please continue all current medications as prescribed.  Your physician recommends that you schedule a follow-up appointment in: 4 months with an echo prior to your exam.  Your physician has requested that you have an echocardiogram. Echocardiography is a painless test that uses sound waves to create images of your heart. It provides your doctor with information about the size and shape of your heart and how well your heart's chambers and valves are working. This procedure takes approximately one hour. There are no restrictions for this procedure.  If you have any questions or concerns before your next appointment please send [place] a message through [place] or call our office at [phone].    TO LEAVE A MESSAGE FOR THE NURSE SELECT OPTION 2, PLEASE LEAVE A MESSAGE INCLUDING: YOUR NAME DATE OF BIRTH CALL BACK NUMBER REASON FOR CALL**this is important as we prioritize the call backs  YOU WILL RECEIVE A CALL BACK THE SAME DAY AS LONG AS YOU CALL BEFORE 4:00 PM   Do the following things EVERYDAY: Weigh yourself in the morning before breakfast. Write it down and keep it in a log. Take your medicines as prescribed Eat low salt foods--Limit salt (sodium) to 2000 mg per day.  Stay as active as you can everyday Limit all fluids for the day to less than 2 liters   At the [facility], you and your health needs are our priority. As part of our continuing mission to provide you with exceptional heart care, we have created designated Provider Care Teams. These Care Teams include your primary Cardiologist (physician) and Advanced Practice Providers (APPs- Physician Assistants and Nurse Practitioners) who all work together to provide you with the care you need, when you need it.   You may see any of the following providers on your designated Care Team at your next follow up: Dr Glori Bickers Dr Haynes Kerns,  NP Lyda Jester, [place] Audry Riles, PharmD   Please be sure to bring in all your medications bottles to every appointment.

## 2021-01-15 NOTE — Progress Notes (Signed)
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   She has a history of NICM, HTN, hyperlipidemia, ILD with mild pulmonary fibrosis, and HFrEF.  Previously a smoker 1 pack/week for 10 years. Quit ~ 2005.   Has had progressive SOB for  > 1 year. Found to have mild pulmonary pulmonary fibrosis. Prior to lung biopsy had echo 7/21 EF 30-35%. Then had R/L cath mild CAD and normal pulmonary pressures. Underwent lung biopsy 10/21 which confirmed UIP.  Has been referred to Dr. Margaretann Loveless and had GDMT titrated. Now on valsartan 320 daily, carvedilol 25 bid, Farxiga 10, spiro 25 daily   cMRI 3/22 LVEF 41% RVEF 41% . No scar, inflammation of infiltrative process.   Says she feels ok. About a month ago had a fall after standing up and broke her ribs. Says she stood up and blacked out. No memory of the event. No preceding dizziness or CP. When she came to SBP 92. Has not recurred. Mild DOE particularly with stairs/inclines. Ok with walking flat. No edema, orthopnea or PND. SBP 120-140.  Denies snoring. Previous sleep study negative. CTD serologies negative 6/21  Hi-res CT 4/22: Stable IPF   PFTs 08/23/19 FEV1 2.26L (92%) FVC 2.61 (80%) DLCO 64%   Previously tried:  Entresto 49/51 mg (dry/itchy mouth, dry eyes) Lisinopril-hctz (SOB, coughing) Metoprolol tartrate (SOB, coughing) Clonidine  Cardiac Testing  05/2020 CMRI LV EF 41% RVEF 41% . No scar, inflammation of infiltrative process.  02/2020 Echo Ef 30-35%   10/2019 RHC RA 1 RV 23/2 PA 22/3 (11) PCWP 8 CO 6.6 CI 3.3   Review of Systems: [y] = yes, _0  = no   General: Weight gain _1 ; Weight loss _2 ; Anorexia _3 ; Fatigue _4 ; Fever _5 ; Chills _6 ; Weakness _7   Cardiac: Chest pain/pressure _8 ; Resting SOB _9 ; Exertional SOB [  Y]; Orthopnea _10 ; Pedal Edema _11 ; Palpitations _12 ; Syncope Blue.Reese ]; Presyncope _13 ; Paroxysmal nocturnal dyspnea_14   Pulmonary: Cough _15 ; Wheezing_16 ; Hemoptysis_17 ; Sputum _18 ; Snoring _19   GI: Vomiting_20 ; Dysphagia_21 ; Melena_22 ; Hematochezia _23 ; Heartburn_24 ; Abdominal pain _25 ; Constipation _26 ; Diarrhea _27 ; BRBPR _28   GU: Hematuria_29 ; Dysuria _30 ; Nocturia_31   Vascular: Pain in legs with walking _32 ; Pain in feet with lying flat _33 ; Non-healing sores _34 ; Stroke _35 ; TIA _36 ; Slurred speech _37 ;  Neuro: Headaches_38 ; Vertigo_39 ; Seizures_40 ; Paresthesias_41 ;Blurred vision _42 ; Diplopia _43 ; Vision changes _44   Ortho/Skin: Arthritis Blue.Reese ]; Joint pain Blue.Reese ]; Muscle pain _45 ; Joint swelling _46 ; Back Pain _47 ; Rash _48   Psych: Depression_49 ; Anxiety_50   Heme: Bleeding problems _51 ; Clotting disorders _52 ; Anemia [ y]  Endocrine: Diabetes _53 ; Thyroid dysfunction_54    Past Medical History:  Diagnosis Date   Anemia    Asthma    "I dont think I have it.   CHF (congestive heart failure) (HCC)    Depression    Emphysema of lung (HCC)    GERD (gastroesophageal reflux disease)    Hemorrhoids    History of kidney stones    Cystoscopy  Hyperlipidemia    Hypertension    Hypothyroidism    Obesity    Pre-diabetes    Rosacea    Tubular adenoma of colon 03/26/2019   Colonoscopy repeat in 3 years    Current Outpatient Medications  Medication Sig Dispense Refill   amLODipine (NORVASC) 5 MG tablet TAKE ONE TABLET BY MOUTH DAILY 90 tablet 3   carvedilol (COREG) 25 MG tablet Take 1 tablet (25 mg total) by mouth 2 (two) times daily. 180 tablet 3   cetirizine (ZYRTEC) 10 MG tablet Take 10 mg by mouth daily.     FARXIGA 10 MG TABS tablet Take 10 mg by mouth daily.     fluticasone (FLONASE) 50 MCG/ACT nasal spray Place 2 sprays into both nostrils at bedtime as needed (allergies.).      levothyroxine (SYNTHROID) 125 MCG tablet TAKE ONE TABLET BY MOUTH DAILY 90 tablet 0   montelukast  (SINGULAIR) 10 MG tablet TAKE ONE TABLET BY MOUTH EVERY NIGHT AT BEDTIME 90 tablet 1   Multiple Vitamin (MULTIVITAMIN WITH MINERALS) TABS tablet Take 1 tablet by mouth daily.     omeprazole (PRILOSEC) 20 MG capsule Take 1 capsule (20 mg total) by mouth daily. 90 capsule 3   ondansetron (ZOFRAN-ODT) 4 MG disintegrating tablet DISSOLVE ONE TABLET BY MOUTH EVERY 8 HOURS AS NEEDED FOR NAUSEA/ VOMITING 20 tablet 0   Pirfenidone (ESBRIET) 801 MG TABS Take 801 mg by mouth with breakfast, with lunch, and with evening meal. 90 tablet 11   rosuvastatin (CRESTOR) 10 MG tablet TAKE ONE TABLET BY MOUTH DAILY 90 tablet 3   spironolactone (ALDACTONE) 25 MG tablet Take 25 mg by mouth daily.     traMADol (ULTRAM) 50 MG tablet Take 1 tablet (50 mg total) by mouth every 8 (eight) hours as needed for moderate pain. 60 tablet 0   valsartan (DIOVAN) 320 MG tablet Take 1 tablet (320 mg total) by mouth daily. 90 tablet 3   venlafaxine XR (EFFEXOR-XR) 150 MG 24 hr capsule Take 1 capsule (150 mg total) by mouth daily. 90 capsule 3   No current facility-administered medications for this encounter.    Allergies  Allergen Reactions   Ace Inhibitors Cough   Clarithromycin     REACTION: GI sx   Codeine Nausea Only   Sulfamethoxazole Nausea Only   Telmisartan Other (See Comments)    sleepy      Social History   Socioeconomic History   Marital status: Married    Spouse name: Not on file   Number of children: Not on file   Years of education: Not on file   Highest education level: Not on file  Occupational History   Not on file  Tobacco Use   Smoking status: Former    Packs/day: 0.50    Years: 15.00    Pack years: 7.50    Types: Cigarettes    Quit date: 08/18/2013    Years since quitting: 7.4   Smokeless tobacco: Never  Vaping Use   Vaping Use: Never used  Substance and Sexual Activity   Alcohol use: Not Currently    Alcohol/week: 1.0 standard drink    Types: 1 Glasses of wine per week    Comment: 2-3  glasses of wine weekly   Drug use: No   Sexual activity: Yes  Other Topics Concern   Not on file  Social History Narrative   Not on file   Social Determinants of Health   Financial Resource Strain: Not on file  Food  Insecurity: Not on file  Transportation Needs: Not on file  Physical Activity: Not on file  Stress: Not on file  Social Connections: Not on file  Intimate Partner Violence: Not on file      Family History  Problem Relation Age of Onset   Heart disease Mother    Hypertension Mother    Osteoporosis Mother    Stroke Mother    Alcohol abuse Father    Diabetes Maternal Grandmother    Drug abuse Son    Breast cancer Neg Hx     Vitals:   01/15/21 1459  BP: 130/80  Pulse: 73  SpO2: 93%  Weight: 91.8 kg (202 lb 6.4 oz)  Height: _0  (1.702 m)   Wt Readings from Last 3 Encounters:  01/15/21 91.8 kg (202 lb 6.4 oz)  01/12/21 91.7 kg (202 lb 3.2 oz)  01/04/21 92.2 kg (203 lb 3.2 oz)    PHYSICAL EXAM: General:  Well appearing. No respiratory difficulty HEENT: normal Neck: supple. no JVD. Carotids 2+ bilat; no bruits. No lymphadenopathy or thryomegaly appreciated. Cor: PMI nondisplaced. Regular rate & rhythm. No rubs, gallops or murmurs. Lungs: clear Abdomen: obese  soft, nontender, nondistended. No hepatosplenomegaly. No bruits or masses. Good bowel sounds. Extremities: no cyanosis, clubbing, rash, edema Neuro: alert & oriented x 3, cranial nerves grossly intact. moves all 4 extremities w/o difficulty. Affect pleasant.  ECG: SR 69 bpm low voltage QRS   ASSESSMENT & PLAN:  1. Chronic Systolic Heart Failure due to NICM - Echo 02/2020 EF 40% RV nomral  - cMRI 05/2020 LVEF 41% RVEF 42% . No scar,fibrosis, inflammation, or infiltrative process.  - cath 7/21 non-obstructive CAD - Etiology remains unclear  - NYHA II - On excellent GDMT so not much more to add - Continue carvedilol 25 bid - Continue Farxiga 10 - Continue valsartan 320 daily (didn't  tolerate Entresto) - Continue spiro 25 - Repeat echo in 6 months. Call me if symptomatically worse  2. IPF, mild - followed by Dr. Chase Caller - recent Hi-res CT stable - no PAH on RHC so does not qualify for Tyvaso - Continue Esbreit   3. CAD, nonobstructive by cath 7/21 - no s/s angina - continue ASA/statin per Dr. Margaretann Loveless  4. HTN - Blood pressure well controlled. Continue current regimen.  Glori Bickers, MD  6:23 PM

## 2021-01-16 ENCOUNTER — Other Ambulatory Visit: Payer: Self-pay | Admitting: Family Medicine

## 2021-01-17 DIAGNOSIS — Z79899 Other long term (current) drug therapy: Secondary | ICD-10-CM

## 2021-01-18 MED ORDER — VALSARTAN 320 MG PO TABS: 320.0000 mg | ORAL_TABLET | Freq: Every day | ORAL | 3 refills | Status: DC

## 2021-01-18 NOTE — Telephone Encounter (Signed)
Spoke with patient who states that she needs her Valsartan refilled. Advised patient that this has been refilled and sent to patient preferred pharmacy. Patient also states that she has been off of her Spironolactone for that last several weeks and reports that her BP has been running 130/80- patient would like to know if she needs to restart this. Advised patient I would forward message to Dr. Margaretann Loveless for her to review and advise. Patient verbalized understanding.   Patients appointment for tomorrow has been cancelled, patient has follow up with Dr. Haroldine Laws scheduled right after her echo in March. Patient very appreciative of Dr. Delphina Cahill care and expressed appreciation for phone call. Advised patient to call back to office with any issues, questions, or concerns. Patient verbalized understanding.

## 2021-01-19 ENCOUNTER — Ambulatory Visit: Payer: Medicare Other | Admitting: Internal Medicine

## 2021-01-21 ENCOUNTER — Other Ambulatory Visit: Payer: Self-pay | Admitting: Family Medicine

## 2021-02-03 DIAGNOSIS — Z79899 Other long term (current) drug therapy: Secondary | ICD-10-CM | POA: Diagnosis not present

## 2021-02-04 LAB — BASIC METABOLIC PANEL
BUN/Creatinine Ratio: 16 (ref 12–28)
BUN: 18 mg/dL (ref 8–27)
CO2: 24 mmol/L (ref 20–29)
Calcium: 10.1 mg/dL (ref 8.7–10.3)
Chloride: 103 mmol/L (ref 96–106)
Creatinine, Ser: 1.1 mg/dL — ABNORMAL HIGH (ref 0.57–1.00)
Glucose: 72 mg/dL (ref 70–99)
Potassium: 4.5 mmol/L (ref 3.5–5.2)
Sodium: 141 mmol/L (ref 134–144)
eGFR: 52 mL/min/{1.73_m2} — ABNORMAL LOW (ref >59)

## 2021-02-07 ENCOUNTER — Other Ambulatory Visit: Payer: Self-pay | Admitting: Internal Medicine

## 2021-02-11 ENCOUNTER — Telehealth: Payer: Self-pay | Admitting: Internal Medicine

## 2021-02-11 ENCOUNTER — Other Ambulatory Visit: Payer: Self-pay | Admitting: Internal Medicine

## 2021-02-11 ENCOUNTER — Encounter: Payer: Self-pay | Admitting: Internal Medicine

## 2021-02-11 ENCOUNTER — Ambulatory Visit: Payer: Medicare Other | Admitting: Internal Medicine

## 2021-02-11 ENCOUNTER — Other Ambulatory Visit: Payer: Self-pay

## 2021-02-11 VITALS — BP 122/72 | HR 80 | Temp 98.1°F | Ht 67.0 in | Wt 201.0 lb

## 2021-02-11 DIAGNOSIS — J84112 Idiopathic pulmonary fibrosis: Secondary | ICD-10-CM

## 2021-02-11 DIAGNOSIS — Z5181 Encounter for therapeutic drug level monitoring: Secondary | ICD-10-CM

## 2021-02-11 LAB — HEPATIC FUNCTION PANEL
ALT: 14 U/L (ref 0–35)
AST: 21 U/L (ref 0–37)
Albumin: 4.3 g/dL (ref 3.5–5.2)
Alkaline Phosphatase: 91 U/L (ref 39–117)
Bilirubin, Direct: 0.1 mg/dL (ref 0.0–0.3)
Total Bilirubin: 0.5 mg/dL (ref 0.2–1.2)
Total Protein: 7.5 g/dL (ref 6.0–8.3)

## 2021-02-11 NOTE — Progress Notes (Signed)
OV 08/19/2019  Subjective:  Patient ID: Susan Hunt, female , DOB: [birth year] , age 75 y.o. , MRN: [id number] , ADDRESS: [place] [address]   08/19/2019 -   Chief Complaint  Patient presents with   Consult    COPD, emphysema.  sob with exertion.  dry coughs alot at night     HPI [name] 75 y.o. -she is a retired Marine scientist.  She used to work at the [employer] across [place] long and having retired from that 10 years ago and then she worked for another agency apparently having retired from all duties few years ago.  Husband is a retired Software engineer.  They are here for shortness of breath.  She tells me that she has had insidious onset of shortness of breath and cough that started 1 year ago and has been progressive.  In the same time she is also been on nitrofurantoin for times.  First 3 of these were 1 week each.  The last 1 being for at least 6 weeks ending approximately 4-6 weeks ago.  Because of progressive symptoms she was given Stiolto but this did not help.  She has been on longstanding Singulair for several years independent of these problems.  This because of allergies.  Therefore she has been referred here.  She did have a chest x-ray that on my personal visualization shows ILD changes.  It is documented below.  She did have a CT abdomen lung images that was reported as normal but I am not so sure from a year ago.  She is a previous smoker.  She is also self-referred herself to [facility] on the basis that she think she might have advanced COPD and is looking for a Zephyr valve.  Symptoms started in August 2020.   [place] Integrated Comprehensive ILD Questionnaire  Symptoms:  She does have a cough.  She says the cough started in August 2020 the same for shortness of breath started.  She does cough at night.  She does cough when she lies down and it gets worse.  She does feel a tickle in her throat.  Cough does not affect her voice she  does not bring any phlegm.  There is no wheezing.  There is no nausea no vomiting no diarrhea.    Past Medical History :   She does have a histo abdomen ry of asthma for the last few years.  Also COPD not otherwise specified for the last few years.  His acid reflux disease for the last few years.  Diabetes for the last few weeks and thyroid disease for the last few years.  Otherwise denies any collagen vascular disease or vasculitis.  Denies any sleep apnea.  Denies pulmonary hypertension.  Denies stroke denies pneumonias recurrent pneumonias.  Denies heart disease or pleurisy.   ROS: Positive for fatigue for the last several months.  Arthralgia for the last several months.  Dry eyes for the last several months.  With intermittent nausea for the last few years.  T there is daytime sleepiness for the last several months.     FAMILY HISTORY of LUNG DISEASE:  -Denies any pulmonary fibrosis.  Denies COPD denies asthma in the family.  Denies sarcoid denies cystic fibrosis denies hypersensitive pneumonitis.  Denies any autoimmune disease.   EXPOSURE HISTORY:   -She denies any Covid history.  Denies any exposure to Covid.  She did do Covid vaccine.  No reactions after  the Covid vaccine.  Did smoke cigarettes 15-year 2000 and 2015 10 to 12 cigarettes a day.  Did not do any passive smoking.  Never did any marijuana.  Elevated electronic cigarettes.  Never use cocaine never used intravenous drug use.   HOME and HOBBY DETAILS :  -[place]  Is lived there for 21 years.  Age of the home is 34 years.  In 2004 there was mold/mildew in the shower curtain.  And then there was mold under the shower.  This was torn down.  This was in 2004 but currently none.  She did some mulch back at work 2 months ago.  No humidifier use no CPAP use.  No nebulizer use.  No steam iron use.  No Jacuzzi use.  No misting Fountain in the house.  No pet birds or parakeets no gerbils.  No feather pillows.  No  mold in the [facility] duct.  No music habits.   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  No otehrr organic antigen exposure that she recollects (MR side note: a different patient in 2016 reported that in 2013-2016 time lot of renovations there and mold +).  Patient reported on September 10, 2019: That between 1966 and 1980 she worked at [employer] and [place].  At that time there is lot of mold exposure in the building.  The building was cleaned out.  Also checked for inorganic antigen exposure and this is negative.  Never worked in a dusty environment.  Never exposed to fumes.   PULMONARY TOXICITY HISTORY (27 items): Use nitrofurantoin in 2019 and 2020.  This is described below.  Question if you can for this there is      Simple office walk 185 feet x  3 laps goal with forehead probe 08/19/2019   O2 used ra  Number laps completed Attempted 3 but desaturated at 2  Comments about pace Normal pace  Resting Pulse Ox/HR 100% and 101/min  Final Pulse Ox/HR 86% and 116/min  Desaturated </= 88% yes  Desaturated <= 3% points Yes , 14  Got Tachycardic >/= 90/min yes  Symptoms at end of test dyspneic  Miscellaneous comments Corrected with  2L [place]    Nuclear medicine cardiac stress test March 2016: Overall Impression:  Normal stress nuclear study with a small, mild, fixed apical defect consistent with thinning; no ischemia. LV Wall Motion:  NL LV Function; NL Wall Motion    CT abdomen lung cuts September 2020: Reported as normal.  I personally visualized and retrospect looks hazy and whether this is atelectasis or some other findings I do not know.  Jul 19, 2019:: Personally visualized shows findings of ILD.  I am not able to see a prior chest x-ray in the system or CT scan of the chest    Results for NABRIA, NEVIN "PAM" (MRN [id number]) as of 08/19/2019 15:18  Ref. Range  03/26/2019 13:45  Creatinine Latest Ref Range: 0.40 - 1.20 mg/dL 1.05   ROS - per HPI Results for TAKINA, BUSSER "PAM" (MRN [id number]) as of 08/19/2019 15:18  Ref. Range 03/26/2019 13:45  Hemoglobin Latest Ref Range: 12.0 - 15.0 g/dL 14.0     OV 09/10/2019  Subjective:  Patient ID: Susan Hunt, female , DOB: 1945-03-18 , age 16 y.o. , MRN: [id number] , ADDRESS: [address] [place] [address]  09/10/2019 -   Chief Complaint  Patient presents with   Follow-up    shortness of breath with exertion and non-productive cough     HPI DARLINDA BELLOWS 75 y.o. -presents for follow-up to discuss results of dyspnea work-up.  High-resolution CT chest was personally visualized.  It shows presence of interstitial lung disease.  It needs Fleischner criteria for diagnosis not consistent with UIP.  Per ATS this is alternative diagnosis to UIP.  Radiologist raised the concern of hypersensitive pneumonitis but there is no air-trapping.  The other possibilities NSIP.  She is no longer on nitrofurantoin.  In any event unit she only took it intermittently.  Overall she is stable since last visit.  She had autoimmune profile.  Is only trace positive for ANA.  Her IgE is slightly elevated.  Her PFT shows mild restriction with reduction in diffusion capacity.  We went over her exposure history again.  This time she did recollect being exposed to mold for 15 to 20 years up until 1980 while living in [place] and working at [employer].  She does not recollect mold at behavioral [facility] where she worked recently until she retired.  However another patient of mine did recollect mold in this building at [employer].   Low risk cardiac stress test in 2016 but with ejection fraction 54%.-Since then no overall change in health.  Husband inquired about the low ejection fraction.  In terms of symptoms: She is try to get portable oxygen system.  She is switching company to [employer] because of the  shortage of portable oxygen systems at [facility].     OV 11/12/2019   Subjective:  Patient ID: Susan Hunt, female , DOB: 07/14/1945, age 42 y.o. years. , MRN: [id number],  ADDRESS: [place] [address] PCP  Leamon Arnt, MD Providers : Treatment Team:  Attending Provider: Brand Males, MD Thoracic surgeon: Dr. Roxan Hockey Cardiologist Dr. Margaretann Loveless  Chief Complaint  Patient presents with   Follow-up    SOB    Follow-up interstitial lung disease not otherwise specified   HPI CYNITHIA HAKIMI 75 y.o. -returns for follow-up.  Her husband is with her.  After her last visit she saw cardiothoracic surgery but we had to hold the surgical lung biopsy because her echocardiogram from September 13, 2019 showed a drop in left ventricular systolic ejection fraction.  Since then she is seen cardiology.  She had a heart catheterization that shows ejection action 40% still low.  Nonobstructive coronary artery disease.  She is followed up with cardiology and she has been started on beta-blocker.  On her father medications was also adjusted.  So far no change in dyspnea.  She tells me overall dyspnea is the same without any change.  Symptom scores shows a two-point shift with worsening but she told the medical assistant that she is feeling worse.  Walking desaturation test today shows - normalcy .  She wants portable oxygen but the first DME company [facility] did not have portable oxygen tanks.  She is found another company [employer] that is able to give her portable oxygen.  She needs a requalifying walk that is documented below.  In terms of surgical lung biopsy: She did have some prominent mediastinal nodes.  It appears Dr. Roxan Hockey has informed them that he will do a EBUS for this.  They have also been told by cardiology that from a cardiac standpoint patient would be a suitable candidate for surgical lung biopsy.  The husband wanted to know if a surgical biopsy is  indicated and also my perspective and safety.  xxxxxxxxxxxxxxxxxxxxxxxxxx Results for CAILEY, TRIGUEROS "PAM" (MRN [id number]) as of 09/10/2019 13:56  Ref. Range 08/23/2019 10:53  FVC-Pre Latest Units: L 2.61  FVC-%Pred-Pre Latest Units: % 80  FEV1-Pre Latest Units: L 2.26  FEV1-%Pred-Pre Latest Units: % 92  Results for CHACE, BISCH "PAM" (MRN [id number]) as of 09/10/2019 13:56  Ref. Range 08/23/2019 10:53  DLCO cor Latest Units: ml/min/mmHg 13.76  DLCO cor % pred Latest Units: % 64   Results for MONROE, TOURE "PAM" (MRN [id number]) as of 09/10/2019 13:56  Ref. Range 08/23/2019 10:53  TLC Latest Units: L 4.15  TLC % pred Latest Units: % 75   xxxxxxxxxxxxxxxxxxxxxx  CT chest high resolution June 2021 Lungs/Pleura: Mild pulmonary fibrosis in a pattern with apical predominance, featuring irregular peripheral interstitial opacity and scattered areas of subpleural bronchiolectasis. Mild, tubular bronchiectasis throughout. No significant air trapping on expiratory phase imaging. No pleural effusion or pneumothorax.   Upper Abdomen: No acute abnormality.   Musculoskeletal: No chest wall mass or suspicious bone lesions identified.   IMPRESSION: 1. Mild pulmonary fibrosis in a pattern with apical predominance, featuring irregular peripheral interstitial opacity and scattered areas of subpleural bronchiolectasis. Mild, tubular bronchiectasis throughout. Findings are most consistent with an "alternative diagnosis" pattern by ATS pulmonary fibrosis criteria, leading differential considerations chronic fibrotic hypersensitivity pneumonitis or NSIP. 2. Prominent mediastinal lymph nodes, nonspecific and likely reactive. 3. Coronary artery disease.  Aortic Atherosclerosis (ICD10-I70.0).     Electronically Signed   By: Eddie Candle M.D.   On: 09/04/2019 15:37    xxxxxxxxxxxxxxxxxxxxxxxxxxxxxxxxxxxxxxxxxxxxxxxxxx  Results for REATA, PETROV "PAM" (MRN [id number]) as of  09/10/2019 13:56  Ref. Range 08/19/2019 16:30 08/19/2019 16:30  SEE BELOW Unknown  Comment  Anti Nuclear Antibody (ANA) Latest Ref Range: NEGATIVE  POSITIVE (A)   ANA Pattern 1 Unknown Cytoplasmic (A)   ANA Titer 1 Unknown 1:80 (H) Negative  Angiotensin-Converting Enzyme Latest Ref Range: 9 - 67 U/L 35   Anti JO-1 Latest Ref Range: 0.0 - 0.9 AI  <0.2  CENTROMERE AB SCREEN Latest Ref Range: 0.0 - 0.9 AI  <6.2  Cyclic Citrullin Peptide Ab Latest Units: UNITS <16   dsDNA Ab Latest Ref Range: 0 - 9 IU/mL  5  ENA RNP Ab Latest Ref Range: 0.0 - 0.9 AI  0.6  ENA SSA (RO) Ab Latest Ref Range: 0.0 - 0.9 AI  <0.2  ENA SSB (LA) Ab Latest Ref Range: 0.0 - 0.9 AI  <0.2  Myeloperoxidase Abs Latest Units: AI <1.0   Serine Protease 3 Latest Units: AI <1.0   RA Latex Turbid. Latest Ref Range: <14 IU/mL <14   ENA SM Ab Ser-aCnc Latest Ref Range: 0.0 - 0.9 AI  <0.2  Chromatin Ab SerPl-aCnc Latest Ref Range: 0.0 - 0.9 AI  <0.2     has a past medical history of Asthma, Depression, Emphysema of lung ([place]), Hemorrhoids, Hyperlipidemia, Hypertension, Hypothyroidism, Obesity, Rosacea, and Tubular adenoma of colon (03/26/2019).  IMPRESSIONS   ECHO 09/13/19  1. Left ventricular ejection fraction, by estimation, is 30 to 35%. The  left ventricle has moderately decreased function. The left ventricle  demonstrates global hypokinesis. Apex poorly visualized, consider repeat  limited echo with contrast to rule out  apical thrombus. The left ventricular internal cavity size was moderately  dilated. Left ventricular diastolic parameters are consistent with Grade I  diastolic dysfunction (impaired relaxation).   2.  Right ventricular systolic function is normal. The right ventricular  size is normal. Tricuspid regurgitation signal is inadequate for assessing  PA pressure.   3. The mitral valve is normal in structure. Trivial mitral valve  regurgitation.   4. The aortic valve was not well visualized. Aortic valve  regurgitation  is not visualized. No aortic stenosis is present.  CATH 8?20/21 Prox LAD to Mid LAD lesion is 30% stenosed. Ost Cx to Prox Cx lesion is 35% stenosed. Prox RCA lesion is 35% stenosed. There is moderate left ventricular systolic dysfunction. LV end diastolic pressure is normal. The left ventricular ejection fraction is 35-45% by visual estimate.   1. Nonobstructive CAD 2. Moderate LV dysfunction. EF estimated at 40% with global hypokinesis 3. Normal LV filling pressures 4. Normal right heart pressures 5. Normal cardiac output.    Plan: medical therapy for LV dysfunction and lipid lowering therapy for nonobstructive CAD.    OV 12/27/2019  Subjective:  Patient ID: Susan Hunt, female , DOB: 03-21-1945 , age 55 y.o. , MRN: [id number] , ADDRESS: [place] [address] PCP Leamon Arnt, MD Patient Care Team: Leamon Arnt, MD as PCP - General (Family Medicine) Elroy Channel, MD as Referring Physician (Internal Medicine) Rockey Situ as Consulting Physician (Obstetrics and Gynecology) Trinda Pascal, FNP as Consulting Physician (Urology) Brand Males, MD as Consulting Physician (Pulmonary Disease)  This Provider for this visit: Treatment Team:  Attending Provider: Brand Males, MD    12/27/2019 -   Chief Complaint  Patient presents with   Follow-up    discuss biopsy results. SOB sometimes. denies cough or wheezing.     HPI REINETTE CUNEO 75 y.o. --follow-up interstitial lung disease.  Had surgical biopsy December 13, 2019.  After that she is was in the hospital requiring oxygen but then got discharged without oxygen.  At this point time she is much improved but still having soreness from her chest incision site.  Husband and she are here.  They are concerned about some postoperative atelectasis that was seen.  I visualized the x-ray.  Today walking desaturation test is normal.  I reassured them about this.  But I  will give her incentive spirometer.  The main issue is to discuss the biopsy results: The bronchoalveolar lavage shows mixed cellularity with neutrophilia.  The surgical lung biopsy itself shows UIP with just 1 carcinoid tumor.  Reviewing her clinical history this UIP pattern is now consistent with IPF.  I gave her the diagnosis.  Regarding the carcinoid tumor let: I think this is an incidental finding.  There is no  DIPNECH syndrome  Her current symptom and walk profile are stable.   Results for TYJA, GORTNEY "PAM" (MRN [id number]) as of 12/27/2019 12:06  Ref. Range 12/13/2019 08:05  Color, Fluid Latest Ref Range: YELLOW  COLORLESS (A)  Total Nucleated Cell Count, Fluid Latest Ref Range: 0 - 1,000 cu mm 18  Lymphs, Fluid Latest Units: % 25  Eos, Fluid Latest Units: % 8  Appearance, Fluid Latest Ref Range: CLEAR  HAZY (A)  Other Cells, Fluid Latest Units: % MESOTHELIAL CELLS NOTED  Neutrophil Count, Fluid Latest Ref Range: 0 - 25 % 44 (H)  Monocyte-Macrophage-Serous Fluid Latest Ref Range: 50 - 90 % 23 (L)    OV 02/04/2020   Subjective:  Patient ID: Susan Hunt, female , DOB: 11-26-1945, age 40 y.o. years. , MRN: [id number],  ADDRESS: [place] [address] PCP  Jonni Sanger,  Karie Fetch, MD Providers : Treatment Team:  Attending Provider: Brand Males, MD Patient Care Team: Leamon Arnt, MD as PCP - General (Family Medicine) Elroy Channel, MD as Referring Physician (Internal Medicine) Rockey Situ as Consulting Physician (Obstetrics and Gynecology) Trinda Pascal, FNP as Consulting Physician (Urology) Brand Males, MD as Consulting Physician (Pulmonary Disease)    Chief Complaint  Patient presents with   Follow-up    ILD, doing well   Follow-up idiopathic pulmonary fibrosis diagnosed on surgical lung biopsy 12/13/2019.  Diagnosis formally given on 12/27/2019 Commenced pirfenidone late October 2021/early November  2021     HPI TAFFY DELCONTE 75 y.o. -returns for follow-up with her husband.  This visit is mainly to focus on pirfenidone uptake.  She has been taking pirfenidone for 3 weeks or more.  She is currently on full dose 3 pills 3 times a day.  The husband wants to know when we would roll to a full largest x1 pill 3 times daily.  I indicated to them that it would be 3 to 4 months of stability with pirfenidone before we made the decision.  Currently she is tolerating pirfenidone well.  She did have some mild nausea and she thinks it is because she did not take a full meal.  Overall she is handling pirfenidone well without any problems.  No fatigue no weight loss.  Respiratory wise she is stable.  She did get in touch with a local support group.  She is asking about pulmonary rehabilitation referral and she is interested in this.  One of the main issues they wanted to discuss was the fact the pirfenidone refill in the supply chain was not consistent.  There was anxiety that they were not going to get the refill.  She had to call the office.  She believes that it is because she made the call to the office that the refill was sent.  She says she tried to call [place] without any response for a long time.  I have indicated to her that I will ask our pharmacist to review and explained the logistics.  She will have a repeat LFT today      OV 04/02/2020  Subjective:  Patient ID: Susan Hunt, female , DOB: 03-Dec-1945 , age 15 y.o. , MRN: [id number] , ADDRESS: [place] [address] PCP Leamon Arnt, MD Patient Care Team: Leamon Arnt, MD as PCP - General (Family Medicine) Elroy Channel, MD as Referring Physician (Internal Medicine) Rockey Situ as Consulting Physician (Obstetrics and Gynecology) Trinda Pascal, FNP as Consulting Physician (Urology) Brand Males, MD as Consulting Physician (Pulmonary Disease)  This Provider for this visit: Treatment Team:   Attending Provider: Brand Males, MD    04/02/2020 -   Chief Complaint  Patient presents with   Follow-up    Pt states she has been doing well since last visit and denies any complaints.   Follow-up idiopathic pulmonary fibrosis diagnosed on surgical lung biopsy 12/13/2019.  Diagnosis formally given on 12/27/2019 Commenced pirfenidone late October 2021/early November 2021  HPI MACLOVIA UHER 75 y.o. -returns for follow-up.  Presents with her husband.  She is tolerating pirfenidone fine with just very mild occasional nausea.  She wants to roll over to the 1 pill 3 times daily.  I have sent a message through secure chat to the pharmacist.  She is going to start pulmonary rehabilitation in February 2022.  We discussed lung transplantation but at this  point we will hold off.  She will check liver function test today.    There are no new issues.  She is already touch base with the support group her symptom score shows stability.  Of note she has returned to portable oxygen because she feels she does not need it.  Walking desaturation test confirms that.    She is interested in clinical trials.  She is touch base with cardiology and looking at a heart failure trial there.  She is also given her name for pulmonary fibrosis trials but currently there is a backlog.  I then emailed Berneda Rose cardiology Geophysicist/field seismologist and she replied saying, "She was in our [facility] trial which her enrollment is complete. I am not aware of any other trial we are screening her for currently but will ask the team. I think IPF trial option would be in her best interest. I also think IPF will exclude her from our HF device trials.  "   PF   OV 07/23/2020  Subjective:  Patient ID: Susan Hunt, female , DOB: [birth year] , age 58 y.o. , MRN: [id number] , ADDRESS: [place] [id number] PCP Leamon Arnt, MD Patient Care Team: Leamon Arnt, MD as PCP - General (Family  Medicine) Elouise Munroe, MD as PCP - Cardiology (Cardiology) Elroy Channel, MD as Referring Physician (Internal Medicine) Rockey Situ as Consulting Physician (Obstetrics and Gynecology) Trinda Pascal, FNP as Consulting Physician (Urology) Brand Males, MD as Consulting Physician (Pulmonary Disease)  This Provider for this visit: Treatment Team:  Attending Provider: Brand Males, MD    07/23/2020 -   Chief Complaint  Patient presents with   Follow-up    IPF/ILD, denies changes   Follow-up idiopathic pulmonary fibrosis diagnosed on surgical lung biopsy 12/13/2019.  Diagnosis formally given on 12/27/2019 Commenced pirfenidone late October 2021/early November 2021. Screen failed for Pliant Part D trial due to soc coreg intake - Spring 2022. IPF final dx on MDD Jul 21, 2020  HPI SAVAHNA CASADOS 75 y.o. -returns for follow-up.  At this point in time she is tolerating pirfenidone well.  In fact no side effects.  Her symptom score is stable.  She had high-resolution CT chest as part of a research protocol and there is no progressions in September 2021.  Her walking desaturation test shows a tendency to drop.  Is unclear if it is worse or the same because she walks faster than usual.  She feels stable.  However April 2022 she had recent PFTs FVC, 2.122, 71.6%, DLCO: 11.4, 54% -> this would suggest a decline.  Of note she try to qualify for the phase 2 study CALLED  Pliant..  However she is on carvedilol for hypertension control.  Hypertensive related to the reason of her cardiomyopathy.  Therefore she is screen failed.  She has good control of blood pressure with carvedilol.  She is interested in other research trials that she might qualify for.  Her husband Netanya Yazdani is with her today.        OV 01/04/2021  Subjective:  Patient ID: Susan Hunt, female , DOB: [birth year] , age 37 y.o. , MRN: [id number] , ADDRESS: [address] [place]  [id number] PCP Leamon Arnt, MD Patient Care Team: Leamon Arnt, MD as PCP - General (Family Medicine) Elouise Munroe, MD as PCP - Cardiology (Cardiology) Elroy Channel, MD as Referring Physician (Internal Medicine) Rockey Situ as Consulting Physician (Obstetrics  and Gynecology) Trinda Pascal, FNP as Consulting Physician (Urology) Brand Males, MD as Consulting Physician (Pulmonary Disease)  This Provider for this visit: Treatment Team:  Attending Provider: Brand Males, MD    01/04/2021 -   Chief Complaint  Patient presents with   Follow-up    Pt states she had a spell 4 weeks ago where her BP dropped too low to where she passed out and fell into a dresser which caused her to break some ribs. Pt said since this happened, she has had a little increased SOB as she is not able to take a deep breath.   Follow-up idiopathic pulmonary fibrosis diagnosed on surgical lung biopsy 12/13/2019.  Diagnosis formally given on 12/27/2019 Commenced pirfenidone late October 2021/early November 2021. Screen failed for Pliant Part D trial due to soc coreg intake - Spring 2022. IPF final dx on MDD Jul 21, 2020  HPI CIENNA DUMAIS 75 y.o. -returns for follow-up.  Last seen in late spring early summer 2022.  She presents with her husband Merry Proud.  She tells me that in August 2022 she broke her right tibia after having a slip and fall on the wet floor.  She then was seen by Dr. Mardelle Matte.  She was in a cast.  Then the cast came off then a few weeks later which was approximately 4 weeks ago in September 2022 she was getting out of bed and then fell down and sustained multiple rib fractures not otherwise specified.  She does not know which rib fractures.  She has not had a CT to confirm rib fracture but only had a chest x-ray.  Her husband believes this is because of orthostasis from her antihypertensives.  Most recently she is seeing cardiology Dr. Margaretann Loveless and has been referred to  the heart failure service.  She also tells me that Dr. Mardelle Matte told her that the chest x-ray suggested she might need thoracentesis and that one of the reason she is here today.  I am not able to visualize a chest x-ray the last chest x-ray was in July 2022 that I personally visualized and this showed pulmonary fibrosis..  At this point in time she is having significant symptoms because of the rib fracture particularly in terms of pain.  Her pain level is 4 out of 5.  This is preventing her from walking and mobilizing well.  She also believe she has gained 15 pounds of weight after starting the pirfenidone.  She says this weight gain happened before the rib fractures.  She is surprised because most often pirfenidone causes weight loss she believes the weight gain is because of pirfenidone.  She believes her thyroid function was normal.  Review of the chart indicates 8 pound weight gain happening mostly between January 2022 and May 2022.  Husband also acknowledges that she is more sedentary  Symptom score shows worsening.  Walking desaturation test is documented below -typically she will be able to complete 3 laps and desaturate 7 8 points although still adequate.  Today she stopped it 2 laps because of shortness of breath.  She did not even have a tachycardic response.  She only dropped 3 points pulse ox.  All of this is is more consistent with deconditioning and splinting from rib fracture rather than pulmonary fibrosis getting worse.     Results for ADRIONNA, DELCID "PAM" (MRN [id number]) as of 01/04/2021 15:19  Ref. Range 12/11/2020 12:08  AST Latest Ref Range: 10 - 35 U/L 20  ALT Latest  Ref Range: 6 - 29 U/L 16    OV 02/11/2021  Subjective:  Patient ID: Susan Hunt, female , DOB: 02-03-1946 , age 31 y.o. , MRN: [id number] , ADDRESS: [address] [place] [address] PCP Leamon Arnt, MD Patient Care Team: Leamon Arnt, MD as PCP - General (Family Medicine) Elouise Munroe, MD as PCP - Cardiology (Cardiology) Elroy Channel, MD as Referring Physician (Internal Medicine) Rockey Situ as Consulting Physician (Obstetrics and Gynecology) Trinda Pascal, FNP as Consulting Physician (Urology) Brand Males, MD as Consulting Physician (Pulmonary Disease)  This Provider for this visit: Treatment Team:  Attending Provider: Brand Males, MD    02/11/2021 -   Chief Complaint  Patient presents with   Follow-up    Pt states she is doing better compared to last visit and states that her ribs are also better as well.   Follow-up idiopathic pulmonary fibrosis diagnosed on surgical lung biopsy 12/13/2019.  Diagnosis formally given on 12/27/2019 Commenced pirfenidone late October 2021/early November 2021. Screen failed for [employer] Part D trial due to soc coreg intake - Spring 2022. IPF final dx on MDD Jul 21, 2020  HPI DANAHI REDDISH 75 y.o. -returns for follow-up.  This visit is to ensure that ILD is not getting worse.  At last visit in the aftermath of her fall and rib fracture she was in significant pain and she is having more shortness of breath.  We could not determine if the ILD was getting worse and was the reason for shortness of breath of the rib fracture was the reason for increased shortness of breath.  We also could not perform pulmonary function testing.  At this point in time she is reporting improved symptoms.  The pain is completely resolved.  She feels the rib fractures have healed.  She is tolerating pirfenidone well without any side effects.  She is also lost some weight.  She is interested in clinical trials.  She feels she can do a PFT again.  Symptom score below shows improvement.      SYMPTOM SCALE - ILD.td  08/19/2019  11/12/2019  12/27/2019 Last Weight  Most recent update: 12/27/2019 11:48 AM     Weight  89.7 kg (197 lb 12.8 oz)             02/04/2020 196#  04/02/2020 197# 07/23/2020 204# 01/04/2021 203#  02/11/2021 201#  O2 use _0  ra ra ra  Shortness of Breath 0 -> 5 scale with 5 being worst (score 6 If unable to do)         At rest 1 1 0 0 0 1 2 0  Simple tasks - showers, clothes change, eating, shaving _1 Household (dishes, doing bed, laundry) _2 Shopping _3 Walking level at own pace _4 0 _5 Walking up Stairs _6 Total (30-36) Dyspnea Score _7 How bad is your cough? x 2 0 0 0 0 0 0  0H0o0w bad is your fatigue x _8 0 2 1  How bad is nausea x 1 0 1 1 0 0 0  How bad is vomiting?  x 1 0 0 0 0 0 0  How bad is diarrhea?  x 0 0 0 0 00 4 0  How bad is anxiety? x 0 0 0 0 0 0 0  How bad is depression x 0 0 0 0 0 0 0  Chronic pain     no  4 0     Simple office walk 185 feet x  3 laps goal with forehead probe 08/19/2019  11/12/2019  12/27/19 BRL 04/02/2020 gso - she has returned her portabl o2 07/23/2020  01/04/2021  02/11/2021   O2 used _0  ra ra  Number laps completed Attempted 3 but desaturated at 2 Did alll 3 on this repeat walk - on beta blocker    2 of 3 laps   Comments about pace Normal pace  Nl pace avg avg slow   Resting Pulse Ox/HR 100% and 101/min 96% and 60/min 94% and 71/min 100% and 60 99% and 76 99% and HR  73   Final Pulse Ox/HR 86% and 116/min 94% and 80/min 96% and 87/min 94% ns 79 90% and HR 84 96% and HR 89   Desaturated </= 88% yes no   no no   Desaturated <= 3% points Yes , 14 no   Yes, 9 poins Yes, 3 points   Got Tachycardic >/= 90/min yes no   no Modrae dyspnea   Symptoms at end of test dyspneic mild none noon no    Miscellaneous comments Corrected with  2L [place] Will return o2 back to dme   no deconditined     CT Chest data  No results found.    PFT  PFT Results Latest Ref Rng & Units 08/23/2019  FVC-Pre L 2.61  FVC-Predicted Pre % 80  FVC-Post L 2.57  FVC-Predicted Post % 79  Pre FEV1/FVC % % 87  Post FEV1/FCV % % 92  FEV1-Pre L 2.26   FEV1-Predicted Pre % 92  FEV1-Post L 2.36  DLCO uncorrected ml/min/mmHg 13.84  DLCO UNC% % 65  DLCO corrected ml/min/mmHg 13.76  DLCO COR %Predicted % 64  DLVA Predicted % 88  TLC L 4.15  TLC % Predicted % 75  RV % Predicted % 63       has a past medical history of Anemia, Asthma, CHF (congestive heart failure) ([place]), Depression, Emphysema of lung ([place]), GERD (gastroesophageal reflux disease), Hemorrhoids, History of kidney stones, Hyperlipidemia, Hypertension, Hypothyroidism, Obesity, Pre-diabetes, Rosacea, and Tubular adenoma of colon (03/26/2019).   reports that she quit smoking about 7 years ago. Her smoking use included cigarettes. She has a 7.50 pack-year smoking history. She has never used smokeless tobacco.  Past Surgical History:  Procedure Laterality Date   ABDOMINAL HYSTERECTOMY     ANTERIOR AND POSTERIOR VAGINAL REPAIR  2020   CARDIAC CATHETERIZATION  11/01/2019   CATARACT EXTRACTION W/ INTRAOCULAR LENS IMPLANT Bilateral 2016   COLONOSCOPY     CYSTOSCOPY     DENTAL SURGERY  2016   Dental Implant   EYE SURGERY Bilateral    cataract   FRACTURE SURGERY Left 2013   Left ankle   INTERCOSTAL NERVE BLOCK Right 12/13/2019   Procedure: INTERCOSTAL NERVE BLOCK;  Surgeon: Melrose Nakayama, MD;  Location: [facility];  Service: Thoracic;  Laterality: Right;   LUNG BIOPSY Right 12/13/2019   Procedure: LUNG BIOPSY;  Surgeon: Melrose Nakayama, MD;  Location: [facility];  Service: Thoracic;  Laterality: Right;   LYMPH NODE BIOPSY Right 12/13/2019   Procedure: LYMPH NODE BIOPSY;  Surgeon: Melrose Nakayama, MD;  Location: [facility];  Service: Thoracic;  Laterality: Right;   medtronix nerve stimulator  07/18/2019   Complete InterStim Sacral Neuromudulation system implantation 07/18/19, [facility]   NASAL SINUS SURGERY     RIGHT/LEFT HEART CATH AND CORONARY ANGIOGRAPHY N/A 11/01/2019   Procedure: RIGHT/LEFT HEART CATH AND CORONARY ANGIOGRAPHY;  Surgeon: Martinique, Peter M, MD;  Location: [facility];  Service: Cardiovascular;  Laterality: N/A;   VAGINECTOMY, PARTIAL     VIDEO BRONCHOSCOPY N/A 12/13/2019   Procedure: VIDEO BRONCHOSCOPY;  Surgeon: Melrose Nakayama, MD;  Location: [facility];  Service: Thoracic;  Laterality: N/A;    Allergies  Allergen Reactions   Ace Inhibitors Cough   Clarithromycin     REACTION: GI sx   Codeine Nausea Only   Sulfamethoxazole Nausea Only   Telmisartan Other (See Comments)    sleepy    Immunization History  Administered Date(s) Administered   Fluad Quad(high Dose 65+) 12/09/2019, 12/11/2020   Hepatitis A, Adult 04/06/2018   Influenza Split 01/06/2009   Influenza, High Dose Seasonal PF 12/17/2014, 12/20/2015, 01/15/2017, 12/18/2017, 11/28/2018   Influenza, Seasonal, Injecte, Preservative Fre 01/07/2014, 12/17/2014   Influenza,inj,Quad PF,6+ Mos 01/15/2017   Influenza,trivalent, recombinat, inj, PF 12/03/2012   Influenza-Unspecified 01/15/2017   Moderna SARS-COV2 Booster Vaccination 01/07/2020   Moderna Sars-Covid-2 Vaccination 04/17/2019, 05/15/2019, 08/20/2020   Pneumococcal Conjugate-13 01/07/2014   Pneumococcal Polysaccharide-23 11/23/2011   Td 03/14/2006, 10/16/2011   Zoster Recombinat (Shingrix) 10/21/2017, 12/18/2017   Zoster, Live 12/06/2013    Family History  Problem Relation Age of Onset   Heart disease Mother    Hypertension Mother    Osteoporosis Mother    Stroke Mother    Alcohol abuse Father    Diabetes Maternal Grandmother    Drug abuse Son    Breast cancer Neg Hx      Current Outpatient Medications:    amLODipine (NORVASC) 5 MG tablet, TAKE ONE TABLET BY MOUTH DAILY, Disp: 90 tablet, Rfl: 3   carvedilol (COREG) 25 MG tablet, Take 1 tablet (25 mg total) by mouth 2 (two) times daily., Disp: 180 tablet, Rfl: 3   cetirizine (ZYRTEC) 10 MG tablet, Take 10 mg by mouth daily., Disp: , Rfl:    FARXIGA 10 MG TABS tablet, Take 10 mg by mouth daily., Disp: , Rfl:    fluticasone (FLONASE) 50 MCG/ACT nasal  spray, Place 2 sprays into both nostrils at bedtime as needed (allergies.). , Disp: , Rfl:    levothyroxine (SYNTHROID) 125 MCG tablet, TAKE ONE TABLET BY MOUTH DAILY, Disp: 90 tablet, Rfl: 0   montelukast (SINGULAIR) 10 MG tablet, TAKE ONE TABLET BY MOUTH EVERY NIGHT AT BEDTIME, Disp: 90 tablet, Rfl: 1   Multiple Vitamin (MULTIVITAMIN WITH MINERALS) TABS tablet, Take 1 tablet by mouth daily., Disp: , Rfl:    omeprazole (PRILOSEC) 20 MG capsule, Take 1 capsule (20 mg total) by mouth daily., Disp: 90 capsule, Rfl: 3   ondansetron (ZOFRAN-ODT) 4 MG disintegrating tablet, DISSOLVE ONE TABLET BY MOUTH EVERY 8 HOURS AS NEEDED FOR NAUSEA/ VOMITING, Disp: 20 tablet, Rfl: 0   Pirfenidone (ESBRIET) 801 MG TABS, Take 801 mg by mouth with breakfast, with lunch, and with evening meal., Disp: 90 tablet, Rfl: 11   rosuvastatin (CRESTOR) 10 MG tablet, TAKE ONE TABLET BY MOUTH DAILY, Disp: 90 tablet, Rfl: 3   spironolactone (ALDACTONE) 25 MG tablet, Take 25 mg by mouth daily., Disp: , Rfl:    traMADol (ULTRAM) 50 MG tablet, Take 1 tablet (50 mg total) by mouth every 8 (eight) hours  as needed for moderate pain., Disp: 60 tablet, Rfl: 0   valsartan (DIOVAN) 320 MG tablet, Take 1 tablet (320 mg total) by mouth daily., Disp: 90 tablet, Rfl: 3   venlafaxine XR (EFFEXOR-XR) 150 MG 24 hr capsule, Take 1 capsule (150 mg total) by mouth daily., Disp: 90 capsule, Rfl: 3      Objective:   Vitals:   02/11/21 1430  BP: 122/72  Pulse: 80  Temp: 98.1 F (36.7 C)  TempSrc: Oral  SpO2: 95%  Weight: 201 lb (91.2 kg)  Height: _0  (1.702 m)    Estimated body mass index is 31.48 kg/m as calculated from the following:   Height as of this encounter: _1  (1.702 m).   Weight as of this encounter: 201 lb (91.2 kg).  _2 @  Filed Weights   02/11/21 1430  Weight: 201 lb (91.2 kg)     Physical Exam  General: No distress. obese Neuro: Alert and Oriented x 3. GCS 15. Speech normal Psych: Pleasant Resp:   Barrel Chest - no.  Wheeze - no, Crackles - yes, No overt respiratory distress CVS: Normal heart sounds. Murmurs - no Ext: Stigmata of Connective Tissue Disease - no HEENT: Normal upper airway. PEERL +. No post nasal drip        Assessment:       ICD-10-CM   1. IPF (idiopathic pulmonary fibrosis) (HCC)  J84.112 Hepatic function panel    Pulmonary function test    2. Encounter for therapeutic drug monitoring  Z51.81 Hepatic function panel         Plan:     Patient Instructions  IPF (idiopathic pulmonary fibrosis) ([place]) Encounter for therapeutic drug monitoring History of rib fracture - sept 2022 Dyspnea on exertion   IPF itself is stable Dyspnea and overall wellbeing improved after healing of rib fractures Glad you feel healed from rib fractures  Plan - check LFT 02/11/2021 (last Sept 2022)  - continue esbriet  as before - appreciate your interest in clinical trials  - have informed Reba about your interest  -  please take ICF copy for a study called TETON -spirometry and dlco in 3 months  Weight gain  - weight gain ins 8 # in 1 year according to pur chart and most of this happened between Jan 2022 -> May 2022 and since then stable -> 3# weight loss since May 2022 to Nov 2022. Currently 201#  Plan  - monitor  Followup  - 12 weeks with Dr Chase Caller on 30 min visit but after PFT  - Symptoms score and walk test at followup    SIGNATURE    Dr. Brand Males, M.D., F.C.C.P,  Pulmonary and Critical Care Medicine Staff Physician, [place] Director - Interstitial Lung Disease  Program  Pulmonary [place] [facility] [place], [place], [address]  Pager: [phone], If no answer or between  15:00h - 7:00h: call [phone] Telephone: [phone]  2:52 PM 02/11/2021

## 2021-02-11 NOTE — Telephone Encounter (Signed)
Also get echo sometime between now and next visit - Last echo dec 2021 - ef 35%. Want to make sure is stable

## 2021-02-11 NOTE — Telephone Encounter (Signed)
Susan Hunt  Forgot to share that on xray oct 2022 there was " New midthoracic spine compression deformity with loss of approximately 50% of the vertebral body height.- new since July 2022:  Plan  - shouyld talk to PCP Leamon Arnt, MD

## 2021-02-11 NOTE — Patient Instructions (Addendum)
IPF (idiopathic pulmonary fibrosis) ([place]) Encounter for therapeutic drug monitoring History of rib fracture - sept 2022 Dyspnea on exertion   IPF itself is stable Dyspnea and overall wellbeing improved after healing of rib fractures Glad you feel healed from rib fractures  Plan - check LFT 02/11/2021 (last Sept 2022)  - continue esbriet  as before - appreciate your interest in clinical trials  - have informed Reba about your interest  -  please take ICF copy for a study called TETON -spirometry and dlco in 3 months  Weight gain  - weight gain ins 8 # in 1 year according to pur chart and most of this happened between Jan 2022 -> May 2022 and since then stable -> 3# weight loss since May 2022 to Nov 2022. Currently 201#  Plan  - monitor  Followup  - 12 weeks with Dr Chase Caller on 30 min visit but after PFT  - Symptoms score and walk test at followup

## 2021-02-12 NOTE — Telephone Encounter (Signed)
Attempted to call pt but unable to reach. Left message for her to return call.

## 2021-02-15 ENCOUNTER — Ambulatory Visit: Payer: Medicare Other | Admitting: Family Medicine

## 2021-02-18 ENCOUNTER — Other Ambulatory Visit: Payer: Self-pay | Admitting: Family Medicine

## 2021-02-18 ENCOUNTER — Encounter: Payer: Self-pay | Admitting: Internal Medicine

## 2021-02-19 ENCOUNTER — Other Ambulatory Visit: Payer: Self-pay

## 2021-02-19 MED ORDER — FARXIGA 10 MG PO TABS
10.0000 mg | ORAL_TABLET | Freq: Every day | ORAL | 0 refills | Status: DC
  Filled 2021-02-19: qty 30, 30d supply, fill #0

## 2021-02-22 ENCOUNTER — Other Ambulatory Visit: Payer: Self-pay

## 2021-02-23 NOTE — Telephone Encounter (Signed)
Attempted to call pt but unable to reach. Left message for her to return call. Due to multiple attempts trying to reach and unable to do so, per protocol encounter will be closed.

## 2021-02-25 ENCOUNTER — Other Ambulatory Visit: Payer: Self-pay

## 2021-02-26 ENCOUNTER — Other Ambulatory Visit: Payer: Self-pay

## 2021-03-01 ENCOUNTER — Other Ambulatory Visit: Payer: Self-pay

## 2021-03-10 ENCOUNTER — Encounter: Payer: Self-pay | Admitting: Internal Medicine

## 2021-03-11 ENCOUNTER — Other Ambulatory Visit: Payer: Self-pay

## 2021-03-12 MED ORDER — FARXIGA 10 MG PO TABS: 10.0000 mg | ORAL_TABLET | Freq: Every day | ORAL | 4 refills | Status: DC

## 2021-03-22 ENCOUNTER — Telehealth (HOSPITAL_COMMUNITY): Payer: Self-pay | Admitting: *Deleted

## 2021-03-23 ENCOUNTER — Encounter: Payer: Self-pay | Admitting: Family Medicine

## 2021-04-07 ENCOUNTER — Other Ambulatory Visit: Payer: Self-pay | Admitting: Internal Medicine

## 2021-04-15 ENCOUNTER — Other Ambulatory Visit: Payer: Self-pay | Admitting: Family Medicine

## 2021-04-19 ENCOUNTER — Other Ambulatory Visit: Payer: Self-pay | Admitting: Internal Medicine

## 2021-04-19 MED ORDER — CARVEDILOL 25 MG PO TABS: 25.0000 mg | ORAL_TABLET | Freq: Two times a day (BID) | ORAL | 3 refills | Status: DC

## 2021-04-21 ENCOUNTER — Other Ambulatory Visit: Payer: Self-pay | Admitting: Family Medicine

## 2021-04-26 ENCOUNTER — Other Ambulatory Visit: Payer: Self-pay | Admitting: Internal Medicine

## 2021-04-26 DIAGNOSIS — J84112 Idiopathic pulmonary fibrosis: Secondary | ICD-10-CM

## 2021-04-26 DIAGNOSIS — Z006 Encounter for examination for normal comparison and control in clinical research program: Secondary | ICD-10-CM

## 2021-04-29 ENCOUNTER — Ambulatory Visit: Payer: Medicare Other | Admitting: Internal Medicine

## 2021-04-29 ENCOUNTER — Encounter: Payer: Self-pay | Admitting: Internal Medicine

## 2021-04-29 ENCOUNTER — Ambulatory Visit (INDEPENDENT_AMBULATORY_CARE_PROVIDER_SITE_OTHER): Payer: Medicare Other | Admitting: Internal Medicine

## 2021-04-29 ENCOUNTER — Other Ambulatory Visit: Payer: Self-pay

## 2021-04-29 VITALS — BP 118/78 | HR 77 | Temp 97.7°F | Ht 67.0 in | Wt 200.0 lb

## 2021-04-29 DIAGNOSIS — J84112 Idiopathic pulmonary fibrosis: Secondary | ICD-10-CM

## 2021-04-29 DIAGNOSIS — Z8781 Personal history of (healed) traumatic fracture: Secondary | ICD-10-CM | POA: Diagnosis not present

## 2021-04-29 DIAGNOSIS — R0609 Other forms of dyspnea: Secondary | ICD-10-CM | POA: Diagnosis not present

## 2021-04-29 DIAGNOSIS — R053 Chronic cough: Secondary | ICD-10-CM

## 2021-04-29 DIAGNOSIS — Z5181 Encounter for therapeutic drug level monitoring: Secondary | ICD-10-CM

## 2021-04-29 LAB — PULMONARY FUNCTION TEST
DL/VA % pred: 96 %
DL/VA: 3.88 ml/min/mmHg/L
DLCO cor % pred: 58 %
DLCO cor: 12.28 ml/min/mmHg
DLCO unc % pred: 58 %
DLCO unc: 12.28 ml/min/mmHg
FEF 25-75 Pre: 2.67 L/sec
FEF2575-%Pred-Pre: 149 %
FEV1-%Pred-Pre: 79 %
FEV1-Pre: 1.87 L
FEV1FVC-%Pred-Pre: 117 %
FEV6-%Pred-Pre: 71 %
FEV6-Pre: 2.13 L
FEV6FVC-%Pred-Pre: 104 %
FVC-%Pred-Pre: 67 %
FVC-Pre: 2.13 L
Pre FEV1/FVC ratio: 88 %
Pre FEV6/FVC Ratio: 100 %

## 2021-04-29 NOTE — Patient Instructions (Addendum)
IPF (idiopathic pulmonary fibrosis) ([place]) Encounter for therapeutic drug monitoring History of rib fracture - sept 2022 Dyspnea on exertion   IPF could be worse at least 10-12% compared to 18 months ago  On the other hand pulmonary function test worsening could be because of a technical issue or splinting from prior rib fracture pain  Overall tolerating pirfenidone well  You have new onset cough -unclear why  Plan - check CBC with differential, blood IgE chemistry, liver function test - continue esbriet  as before - appreciate your interest in clinical trials - TETON phase 3 visit consent visit scheduled for 05/10/2021 -Do standard of care spirometry and DLCO in 3 months [we can cancel this if you are progressing well with research]  Weight gain  - weight gain stable at this visit  Plan  - monitor  Followup  - 12 weeks with Dr Chase Caller on 30 min visit will after spirometry and DLCO  - Symptoms score and walk test at followup  -

## 2021-04-29 NOTE — Addendum Note (Signed)
Addended by: Lorretta Harp on: 04/29/2021 04:19 PM   Modules accepted: Orders

## 2021-04-29 NOTE — Progress Notes (Signed)
OV 08/19/2019  Subjective:  Patient ID: Susan Hunt, female , DOB: Jul 29, 1945 , age 76 y.o. , MRN: [id number] , ADDRESS: [place] [address]   08/19/2019 -   Chief Complaint  Patient presents with   Consult    COPD, emphysema.  sob with exertion.  dry coughs alot at night     HPI Susan Hunt 76 y.o. -she is a retired Marine scientist.  She used to work at the [employer] [place] [employer] and having retired from that 10 years ago and then she worked for another agency apparently having retired from all duties few years ago.  Husband is a retired Software engineer.  They are here for shortness of breath.  She tells me that she has had insidious onset of shortness of breath and cough that started 1 year ago and has been progressive.  In the same time she is also been on nitrofurantoin for times.  First 3 of these were 1 week each.  The last 1 being for at least 6 weeks ending approximately 4-6 weeks ago.  Because of progressive symptoms she was given Stiolto but this did not help.  She has been on longstanding Singulair for several years independent of these problems.  This because of allergies.  Therefore she has been referred here.  She did have a chest x-ray that on my personal visualization shows ILD changes.  It is documented below.  She did have a CT abdomen lung images that was reported as normal but I am not so sure from a year ago.  She is a previous smoker.  She is also self-referred herself to [facility] on the basis that she think she might have advanced COPD and is looking for a Zephyr valve.  Symptoms started in August 2020.   [facility] Comprehensive ILD Questionnaire  Symptoms:  She does have a cough.  She says the cough started in August 2020 the same for shortness of breath started.  She does cough at night.  She does cough when she lies down and it gets worse.  She does feel a tickle in her throat.  Cough does not affect her voice  she does not bring any phlegm.  There is no wheezing.  There is no nausea no vomiting no diarrhea.    Past Medical History :   She does have a histo abdomen ry of asthma for the last few years.  Also COPD not otherwise specified for the last few years.  His acid reflux disease for the last few years.  Diabetes for the last few weeks and thyroid disease for the last few years.  Otherwise denies any collagen vascular disease or vasculitis.  Denies any sleep apnea.  Denies pulmonary hypertension.  Denies stroke denies pneumonias recurrent pneumonias.  Denies heart disease or pleurisy.   ROS: Positive for fatigue for the last several months.  Arthralgia for the last several months.  Dry eyes for the last several months.  With intermittent nausea for the last few years.  T there is daytime sleepiness for the last several months.     FAMILY HISTORY of LUNG DISEASE:  -Denies any pulmonary fibrosis.  Denies COPD denies asthma in the family.  Denies sarcoid denies cystic fibrosis denies hypersensitive pneumonitis.  Denies any autoimmune disease.   EXPOSURE HISTORY:   -She denies any Covid history.  Denies any exposure to Covid.  She did do Covid vaccine.  No reactions  after the Covid vaccine.  Did smoke cigarettes 15-year 2000 and 2015 10 to 12 cigarettes a day.  Did not do any passive smoking.  Never did any marijuana.  Elevated electronic cigarettes.  Never use cocaine never used intravenous drug use.   HOME and HOBBY DETAILS :  -[place]  Is lived there for 21 years.  Age of the home is 34 years.  In 2004 there was mold/mildew in the shower curtain.  And then there was mold under the shower.  This was torn down.  This was in 2004 but currently none.  She did some mulch back at work 2 months ago.  No humidifier use no CPAP use.  No nebulizer use.  No steam iron use.  No Jacuzzi use.  No misting Fountain in the house.  No pet birds or parakeets no gerbils.  No feather pillows.   No mold in the [facility] duct.  No music habits.   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  No otehrr organic antigen exposure that she recollects (MR side note: a different patient in 2016 reported that in 2013-2016 time lot of renovations there and mold +).  Patient reported on September 10, 2019: That between 1966 and 1980 she worked at [employer] and [place].  At that time there is lot of mold exposure in the building.  The building was cleaned out.  Also checked for inorganic antigen exposure and this is negative.  Never worked in a dusty environment.  Never exposed to fumes.   PULMONARY TOXICITY HISTORY (27 items): Use nitrofurantoin in 2019 and 2020.  This is described below.  Question if you can for this there is      Simple office walk 185 feet x  3 laps goal with forehead probe 08/19/2019   O2 used ra  Number laps completed Attempted 3 but desaturated at 2  Comments about pace Normal pace  Resting Pulse Ox/HR 100% and 101/min  Final Pulse Ox/HR 86% and 116/min  Desaturated </= 88% yes  Desaturated <= 3% points Yes , 14  Got Tachycardic >/= 90/min yes  Symptoms at end of test dyspneic  Miscellaneous comments Corrected with  2L [place]    Nuclear medicine cardiac stress test March 2016: Overall Impression:  Normal stress nuclear study with a small, mild, fixed apical defect consistent with thinning; no ischemia. LV Wall Motion:  NL LV Function; NL Wall Motion    CT abdomen lung cuts September 2020: Reported as normal.  I personally visualized and retrospect looks hazy and whether this is atelectasis or some other findings I do not know.  Jul 19, 2019:: Personally visualized shows findings of ILD.  I am not able to see a prior chest x-ray in the system or CT scan of the chest    Results for Susan, [name] (MRN [id number]) as of 08/19/2019 15:18  Ref.  Range 03/26/2019 13:45  Creatinine Latest Ref Range: 0.40 - 1.20 mg/dL 1.05   ROS - per HPI Results for Susan, [name] (MRN [id number]) as of 08/19/2019 15:18  Ref. Range 03/26/2019 13:45  Hemoglobin Latest Ref Range: 12.0 - 15.0 g/dL 14.0     OV 09/10/2019  Subjective:  Patient ID: Susan Hunt, female , DOB: 1946-02-26 , age 54 y.o. , MRN: [id number] , ADDRESS: [address] [place] [address]  09/10/2019 -   Chief Complaint  Patient presents with   Follow-up    shortness of breath with exertion and non-productive cough     HPI DANNE SCARDINA 76 y.o. -presents for follow-up to discuss results of dyspnea work-up.  High-resolution CT chest was personally visualized.  It shows presence of interstitial lung disease.  It needs Fleischner criteria for diagnosis not consistent with UIP.  Per ATS this is alternative diagnosis to UIP.  Radiologist raised the concern of hypersensitive pneumonitis but there is no air-trapping.  The other possibilities NSIP.  She is no longer on nitrofurantoin.  In any event unit she only took it intermittently.  Overall she is stable since last visit.  She had autoimmune profile.  Is only trace positive for ANA.  Her IgE is slightly elevated.  Her PFT shows mild restriction with reduction in diffusion capacity.  We went over her exposure history again.  This time she did recollect being exposed to mold for 15 to 20 years up until 1980 while living in [place] and working at [employer].  She does not recollect mold at behavioral [facility] where she worked recently until she retired.  However another patient of mine did recollect mold in this building at [employer].   Low risk cardiac stress test in 2016 but with ejection fraction 54%.-Since then no overall change in health.  Husband inquired about the low ejection fraction.  In terms of symptoms: She is try to get portable oxygen system.  She is switching company to [employer] because of  the shortage of portable oxygen systems at [facility].     OV 11/12/2019   Subjective:  Patient ID: Susan Hunt, female , DOB: [birth year], age 79 y.o. years. , MRN: [id number],  ADDRESS: [place] [address] PCP  Leamon Arnt, MD Providers : Treatment Team:  Attending Provider: Brand Males, MD Thoracic surgeon: Dr. Roxan Hockey Cardiologist Dr. Margaretann Loveless  Chief Complaint  Patient presents with   Follow-up    SOB    Follow-up interstitial lung disease not otherwise specified   HPI MARINNA Hunt 76 y.o. -returns for follow-up.  Her husband is with her.  After her last visit she saw cardiothoracic surgery but we had to hold the surgical lung biopsy because her echocardiogram from September 13, 2019 showed a drop in left ventricular systolic ejection fraction.  Since then she is seen cardiology.  She had a heart catheterization that shows ejection action 40% still low.  Nonobstructive coronary artery disease.  She is followed up with cardiology and she has been started on beta-blocker.  On her father medications was also adjusted.  So far no change in dyspnea.  She tells me overall dyspnea is the same without any change.  Symptom scores shows a two-point shift with worsening but she told the medical assistant that she is feeling worse.  Walking desaturation test today shows - normalcy .  She wants portable oxygen but the first DME company [facility] did not have portable oxygen tanks.  She is found another company [employer] that is able to give her portable oxygen.  She needs a requalifying walk that is documented below.  In terms of surgical lung biopsy: She did have some prominent mediastinal nodes.  It appears Dr. Roxan Hockey has informed them that he will do a EBUS for this.  They have also been told by cardiology that from a cardiac standpoint patient would be a suitable candidate for surgical lung biopsy.  The husband wanted to know if a surgical biopsy is  indicated and also my perspective and safety.  xxxxxxxxxxxxxxxxxxxxxxxxxx Results for AICIA, [name] (MRN [id number]) as of 09/10/2019 13:56  Ref. Range 08/23/2019 10:53  FVC-Pre Latest Units: L 2.61  FVC-%Pred-Pre Latest Units: % 80  FEV1-Pre Latest Units: L 2.26  FEV1-%Pred-Pre Latest Units: % 92  Results for BETTYLOU, [name] (MRN [id number]) as of 09/10/2019 13:56  Ref. Range 08/23/2019 10:53  DLCO cor Latest Units: ml/min/mmHg 13.76  DLCO cor % pred Latest Units: % 64   Results for TOSHIKA, [name] (MRN [id number]) as of 09/10/2019 13:56  Ref. Range 08/23/2019 10:53  TLC Latest Units: L 4.15  TLC % pred Latest Units: % 75   xxxxxxxxxxxxxxxxxxxxxx  CT chest high resolution June 2021 Lungs/Pleura: Mild pulmonary fibrosis in a pattern with apical predominance, featuring irregular peripheral interstitial opacity and scattered areas of subpleural bronchiolectasis. Mild, tubular bronchiectasis throughout. No significant air trapping on expiratory phase imaging. No pleural effusion or pneumothorax.   Upper Abdomen: No acute abnormality.   Musculoskeletal: No chest wall mass or suspicious bone lesions identified.   IMPRESSION: 1. Mild pulmonary fibrosis in a pattern with apical predominance, featuring irregular peripheral interstitial opacity and scattered areas of subpleural bronchiolectasis. Mild, tubular bronchiectasis throughout. Findings are most consistent with an "alternative diagnosis" pattern by ATS pulmonary fibrosis criteria, leading differential considerations chronic fibrotic hypersensitivity pneumonitis or NSIP. 2. Prominent mediastinal lymph nodes, nonspecific and likely reactive. 3. Coronary artery disease.  Aortic Atherosclerosis (ICD10-I70.0).     Electronically Signed   By: Eddie Candle M.D.   On: 09/04/2019 15:37    xxxxxxxxxxxxxxxxxxxxxxxxxxxxxxxxxxxxxxxxxxxxxxxxxx  Results for KHALIYA, [name] (MRN [id number]) as of  09/10/2019 13:56  Ref. Range 08/19/2019 16:30 08/19/2019 16:30  SEE BELOW Unknown  Comment  Anti Nuclear Antibody (ANA) Latest Ref Range: NEGATIVE  POSITIVE (A)   ANA Pattern 1 Unknown Cytoplasmic (A)   ANA Titer 1 Unknown 1:80 (H) Negative  Angiotensin-Converting Enzyme Latest Ref Range: 9 - 67 U/L 35   Anti JO-1 Latest Ref Range: 0.0 - 0.9 AI  <0.2  CENTROMERE AB SCREEN Latest Ref Range: 0.0 - 0.9 AI  <0.1  Cyclic Citrullin Peptide Ab Latest Units: UNITS <16   dsDNA Ab Latest Ref Range: 0 - 9 IU/mL  5  ENA RNP Ab Latest Ref Range: 0.0 - 0.9 AI  0.6  ENA SSA (RO) Ab Latest Ref Range: 0.0 - 0.9 AI  <0.2  ENA SSB (LA) Ab Latest Ref Range: 0.0 - 0.9 AI  <0.2  Myeloperoxidase Abs Latest Units: AI <1.0   Serine Protease 3 Latest Units: AI <1.0   RA Latex Turbid. Latest Ref Range: <14 IU/mL <14   ENA SM Ab Ser-aCnc Latest Ref Range: 0.0 - 0.9 AI  <0.2  Chromatin Ab SerPl-aCnc Latest Ref Range: 0.0 - 0.9 AI  <0.2     has a past medical history of Asthma, Depression, Emphysema of lung ([place]), Hemorrhoids, Hyperlipidemia, Hypertension, Hypothyroidism, Obesity, Rosacea, and Tubular adenoma of colon (03/26/2019).  IMPRESSIONS   ECHO 09/13/19  1. Left ventricular ejection fraction, by estimation, is 30 to 35%. The  left ventricle has moderately decreased function. The left ventricle  demonstrates global hypokinesis. Apex poorly visualized, consider repeat  limited echo with contrast to rule out  apical thrombus. The left ventricular internal cavity size was moderately  dilated. Left ventricular diastolic parameters are consistent with Grade I  diastolic dysfunction (impaired relaxation).   2.  Right ventricular systolic function is normal. The right ventricular  size is normal. Tricuspid regurgitation signal is inadequate for assessing  PA pressure.   3. The mitral valve is normal in structure. Trivial mitral valve  regurgitation.   4. The aortic valve was not well visualized. Aortic valve  regurgitation  is not visualized. No aortic stenosis is present.  CATH 8?20/21 Prox LAD to Mid LAD lesion is 30% stenosed. Ost Cx to Prox Cx lesion is 35% stenosed. Prox RCA lesion is 35% stenosed. There is moderate left ventricular systolic dysfunction. LV end diastolic pressure is normal. The left ventricular ejection fraction is 35-45% by visual estimate.   1. Nonobstructive CAD 2. Moderate LV dysfunction. EF estimated at 40% with global hypokinesis 3. Normal LV filling pressures 4. Normal right heart pressures 5. Normal cardiac output.    Plan: medical therapy for LV dysfunction and lipid lowering therapy for nonobstructive CAD.    OV 12/27/2019  Subjective:  Patient ID: Susan Hunt, female , DOB: 07-08-1945 , age 53 y.o. , MRN: [id number] , ADDRESS: [place] [address] PCP Leamon Arnt, MD Patient Care Team: Leamon Arnt, MD as PCP - General (Family Medicine) Elroy Channel, MD as Referring Physician (Internal Medicine) Rockey Situ as Consulting Physician (Obstetrics and Gynecology) Trinda Pascal, FNP as Consulting Physician (Urology) Brand Males, MD as Consulting Physician (Pulmonary Disease)  This Provider for this visit: Treatment Team:  Attending Provider: Brand Males, MD    12/27/2019 -   Chief Complaint  Patient presents with   Follow-up    discuss biopsy results. SOB sometimes. denies cough or wheezing.     HPI Susan Hunt 76 y.o. --follow-up interstitial lung disease.  Had surgical biopsy December 13, 2019.  After that she is was in the hospital requiring oxygen but then got discharged without oxygen.  At this point time she is much improved but still having soreness from her chest incision site.  Husband and she are here.  They are concerned about some postoperative atelectasis that was seen.  I visualized the x-ray.  Today walking desaturation test is normal.  I reassured them about this.  But I  will give her incentive spirometer.  The main issue is to discuss the biopsy results: The bronchoalveolar lavage shows mixed cellularity with neutrophilia.  The surgical lung biopsy itself shows UIP with just 1 carcinoid tumor.  Reviewing her clinical history this UIP pattern is now consistent with IPF.  I gave her the diagnosis.  Regarding the carcinoid tumor let: I think this is an incidental finding.  There is no  DIPNECH syndrome  Her current symptom and walk profile are stable.   Results for SHONTELLE, [name] (MRN [id number]) as of 12/27/2019 12:06  Ref. Range 12/13/2019 08:05  Color, Fluid Latest Ref Range: YELLOW  COLORLESS (A)  Total Nucleated Cell Count, Fluid Latest Ref Range: 0 - 1,000 cu mm 18  Lymphs, Fluid Latest Units: % 25  Eos, Fluid Latest Units: % 8  Appearance, Fluid Latest Ref Range: CLEAR  HAZY (A)  Other Cells, Fluid Latest Units: % MESOTHELIAL CELLS NOTED  Neutrophil Count, Fluid Latest Ref Range: 0 - 25 % 44 (H)  Monocyte-Macrophage-Serous Fluid Latest Ref Range: 50 - 90 % 23 (L)    OV 02/04/2020   Subjective:  Patient ID: Susan Hunt, female , DOB: 1946/02/22, age 48 y.o. years. , MRN: [id number],  ADDRESS: [place] [address] PCP  Jonni Sanger,  Karie Fetch, MD Providers : Treatment Team:  Attending Provider: Brand Males, MD Patient Care Team: Leamon Arnt, MD as PCP - General (Family Medicine) Elroy Channel, MD as Referring Physician (Internal Medicine) Rockey Situ as Consulting Physician (Obstetrics and Gynecology) Trinda Pascal, FNP as Consulting Physician (Urology) Brand Males, MD as Consulting Physician (Pulmonary Disease)    Chief Complaint  Patient presents with   Follow-up    ILD, doing well   Follow-up idiopathic pulmonary fibrosis diagnosed on surgical lung biopsy 12/13/2019.  Diagnosis formally given on 12/27/2019 Commenced pirfenidone late October 2021/early November  2021     HPI PORCHA DEBLANC 76 y.o. -returns for follow-up with her husband.  This visit is mainly to focus on pirfenidone uptake.  She has been taking pirfenidone for 3 weeks or more.  She is currently on full dose 3 pills 3 times a day.  The husband wants to know when we would roll to a full largest x1 pill 3 times daily.  I indicated to them that it would be 3 to 4 months of stability with pirfenidone before we made the decision.  Currently she is tolerating pirfenidone well.  She did have some mild nausea and she thinks it is because she did not take a full meal.  Overall she is handling pirfenidone well without any problems.  No fatigue no weight loss.  Respiratory wise she is stable.  She did get in touch with a local support group.  She is asking about pulmonary rehabilitation referral and she is interested in this.  One of the main issues they wanted to discuss was the fact the pirfenidone refill in the supply chain was not consistent.  There was anxiety that they were not going to get the refill.  She had to call the office.  She believes that it is because she made the call to the office that the refill was sent.  She says she tried to call [place] without any response for a long time.  I have indicated to her that I will ask our pharmacist to review and explained the logistics.  She will have a repeat LFT today      OV 04/02/2020  Subjective:  Patient ID: Susan Hunt, female , DOB: Mar 22, 1945 , age 84 y.o. , MRN: [id number] , ADDRESS: [place] [address] PCP Leamon Arnt, MD Patient Care Team: Leamon Arnt, MD as PCP - General (Family Medicine) Elroy Channel, MD as Referring Physician (Internal Medicine) Rockey Situ as Consulting Physician (Obstetrics and Gynecology) Trinda Pascal, FNP as Consulting Physician (Urology) Brand Males, MD as Consulting Physician (Pulmonary Disease)  This Provider for this visit: Treatment Team:   Attending Provider: Brand Males, MD    04/02/2020 -   Chief Complaint  Patient presents with   Follow-up    Pt states she has been doing well since last visit and denies any complaints.   Follow-up idiopathic pulmonary fibrosis diagnosed on surgical lung biopsy 12/13/2019.  Diagnosis formally given on 12/27/2019 Commenced pirfenidone late October 2021/early November 2021  HPI ARIELY RIDDELL 76 y.o. -returns for follow-up.  Presents with her husband.  She is tolerating pirfenidone fine with just very mild occasional nausea.  She wants to roll over to the 1 pill 3 times daily.  I have sent a message through secure chat to the pharmacist.  She is going to start pulmonary rehabilitation in February 2022.  We discussed lung transplantation but at this  point we will hold off.  She will check liver function test today.    There are no new issues.  She is already touch base with the support group her symptom score shows stability.  Of note she has returned to portable oxygen because she feels she does not need it.  Walking desaturation test confirms that.    She is interested in clinical trials.  She is touch base with cardiology and looking at a heart failure trial there.  She is also given her name for pulmonary fibrosis trials but currently there is a backlog.  I then emailed Berneda Rose cardiology Geophysicist/field seismologist and she replied saying, "She was in our [facility] trial which her enrollment is complete. I am not aware of any other trial we are screening her for currently but will ask the team. I think IPF trial option would be in her best interest. I also think IPF will exclude her from our HF device trials.  "   PF   OV 07/23/2020  Subjective:  Patient ID: Susan Hunt, female , DOB: 03/24/1945 , age 26 y.o. , MRN: [id number] , ADDRESS: [place] [id number] PCP Leamon Arnt, MD Patient Care Team: Leamon Arnt, MD as PCP - General (Family  Medicine) Elouise Munroe, MD as PCP - Cardiology (Cardiology) Elroy Channel, MD as Referring Physician (Internal Medicine) Rockey Situ as Consulting Physician (Obstetrics and Gynecology) Trinda Pascal, FNP as Consulting Physician (Urology) Brand Males, MD as Consulting Physician (Pulmonary Disease)  This Provider for this visit: Treatment Team:  Attending Provider: Brand Males, MD    07/23/2020 -   Chief Complaint  Patient presents with   Follow-up    IPF/ILD, denies changes   Follow-up idiopathic pulmonary fibrosis diagnosed on surgical lung biopsy 12/13/2019.  Diagnosis formally given on 12/27/2019 Commenced pirfenidone late October 2021/early November 2021. Screen failed for Pliant Part D trial due to soc coreg intake - Spring 2022. IPF final dx on MDD Jul 21, 2020  HPI CAMRIN LAPRE 75 y.o. -returns for follow-up.  At this point in time she is tolerating pirfenidone well.  In fact no side effects.  Her symptom score is stable.  She had high-resolution CT chest as part of a research protocol and there is no progressions in September 2021.  Her walking desaturation test shows a tendency to drop.  Is unclear if it is worse or the same because she walks faster than usual.  She feels stable.  However April 2022 she had recent PFTs FVC, 2.122, 71.6%, DLCO: 11.4, 54% -> this would suggest a decline.  Of note she try to qualify for the phase 2 study CALLED  Pliant..  However she is on carvedilol for hypertension control.  Hypertensive related to the reason of her cardiomyopathy.  Therefore she is screen failed.  She has good control of blood pressure with carvedilol.  She is interested in other research trials that she might qualify for.  Her husband Halen Mossbarger is with her today.        OV 01/04/2021  Subjective:  Patient ID: Susan Hunt, female , DOB: May 30, 1945 , age 33 y.o. , MRN: [id number] , ADDRESS: [address] [place]  [id number] PCP Leamon Arnt, MD Patient Care Team: Leamon Arnt, MD as PCP - General (Family Medicine) Elouise Munroe, MD as PCP - Cardiology (Cardiology) Elroy Channel, MD as Referring Physician (Internal Medicine) Rockey Situ as Consulting Physician (Obstetrics  and Gynecology) Trinda Pascal, FNP as Consulting Physician (Urology) Brand Males, MD as Consulting Physician (Pulmonary Disease)  This Provider for this visit: Treatment Team:  Attending Provider: Brand Males, MD    01/04/2021 -   Chief Complaint  Patient presents with   Follow-up    Pt states she had a spell 4 weeks ago where her BP dropped too low to where she passed out and fell into a dresser which caused her to break some ribs. Pt said since this happened, she has had a little increased SOB as she is not able to take a deep breath.   Follow-up idiopathic pulmonary fibrosis diagnosed on surgical lung biopsy 12/13/2019.  Diagnosis formally given on 12/27/2019 Commenced pirfenidone late October 2021/early November 2021. Screen failed for Pliant Part D trial due to soc coreg intake - Spring 2022. IPF final dx on MDD Jul 21, 2020  HPI TALYA QUAIN 76 y.o. -returns for follow-up.  Last seen in late spring early summer 2022.  She presents with her husband Merry Proud.  She tells me that in August 2022 she broke her right tibia after having a slip and fall on the wet floor.  She then was seen by Dr. Mardelle Matte.  She was in a cast.  Then the cast came off then a few weeks later which was approximately 4 weeks ago in September 2022 she was getting out of bed and then fell down and sustained multiple rib fractures not otherwise specified.  She does not know which rib fractures.  She has not had a CT to confirm rib fracture but only had a chest x-ray.  Her husband believes this is because of orthostasis from her antihypertensives.  Most recently she is seeing cardiology Dr. Margaretann Loveless and has been referred to  the heart failure service.  She also tells me that Dr. Mardelle Matte told her that the chest x-ray suggested she might need thoracentesis and that one of the reason she is here today.  I am not able to visualize a chest x-ray the last chest x-ray was in July 2022 that I personally visualized and this showed pulmonary fibrosis..  At this point in time she is having significant symptoms because of the rib fracture particularly in terms of pain.  Her pain level is 4 out of 5.  This is preventing her from walking and mobilizing well.  She also believe she has gained 15 pounds of weight after starting the pirfenidone.  She says this weight gain happened before the rib fractures.  She is surprised because most often pirfenidone causes weight loss she believes the weight gain is because of pirfenidone.  She believes her thyroid function was normal.  Review of the chart indicates 8 pound weight gain happening mostly between January 2022 and May 2022.  Husband also acknowledges that she is more sedentary  Symptom score shows worsening.  Walking desaturation test is documented below -typically she will be able to complete 3 laps and desaturate 7 8 points although still adequate.  Today she stopped it 2 laps because of shortness of breath.  She did not even have a tachycardic response.  She only dropped 3 points pulse ox.  All of this is is more consistent with deconditioning and splinting from rib fracture rather than pulmonary fibrosis getting worse.     Results for KEWANDA, POLAND "Susan" (MRN [id number]) as of 01/04/2021 15:19  Ref. Range 12/11/2020 12:08  AST Latest Ref Range: 10 - 35 U/L 20  ALT Latest  Ref Range: 6 - 29 U/L 16    OV 02/11/2021  Subjective:  Patient ID: Susan Hunt, female , DOB: 06-04-1945 , age 64 y.o. , MRN: [id number] , ADDRESS: [address] [place] [address] PCP Leamon Arnt, MD Patient Care Team: Leamon Arnt, MD as PCP - General (Family Medicine) Elouise Munroe, MD as PCP - Cardiology (Cardiology) Elroy Channel, MD as Referring Physician (Internal Medicine) Rockey Situ as Consulting Physician (Obstetrics and Gynecology) Trinda Pascal, FNP as Consulting Physician (Urology) Brand Males, MD as Consulting Physician (Pulmonary Disease)  This Provider for this visit: Treatment Team:  Attending Provider: Brand Males, MD    02/11/2021 -   Chief Complaint  Patient presents with   Follow-up    Pt states she is doing better compared to last visit and states that her ribs are also better as well.   Follow-up idiopathic pulmonary fibrosis diagnosed on surgical lung biopsy 12/13/2019.  Diagnosis formally given on 12/27/2019 Commenced pirfenidone late October 2021/early November 2021. Screen failed for Pliant Part D trial due to soc coreg intake - Spring 2022. IPF final dx on MDD Jul 21, 2020  HPI Susan Hunt 76 y.o. -returns for follow-up.  This visit is to ensure that ILD is not getting worse.  At last visit in the aftermath of her fall and rib fracture she was in significant pain and she is having more shortness of breath.  We could not determine if the ILD was getting worse and was the reason for shortness of breath of the rib fracture was the reason for increased shortness of breath.  We also could not perform pulmonary function testing.  At this point in time she is reporting improved symptoms.  The pain is completely resolved.  She feels the rib fractures have healed.  She is tolerating pirfenidone well without any side effects.  She is also lost some weight.  She is interested in clinical trials.  She feels she can do a PFT again.  Symptom score below shows improvement.   CT Chest data  No results found.     OV 04/29/2021  Subjective:  Patient ID: Susan Hunt, female , DOB: 1945/07/01 , age 50 y.o. , MRN: [id number] , ADDRESS: [place] [id number] PCP Leamon Arnt, MD Patient Care  Team: Leamon Arnt, MD as PCP - General (Family Medicine) Elouise Munroe, MD as PCP - Cardiology (Cardiology) Elroy Channel, MD as Referring Physician (Internal Medicine) Rockey Situ as Consulting Physician (Obstetrics and Gynecology) Trinda Pascal, FNP as Consulting Physician (Urology) Brand Males, MD as Consulting Physician (Pulmonary Disease)  This Provider for this visit: Treatment Team:  Attending Provider: Brand Males, MD    04/29/2021 -   Chief Complaint  Patient presents with   Follow-up    PFT performed today. Pt states she has been coughing a lot since last visit.    Follow-up idiopathic pulmonary fibrosis diagnosed on surgical lung biopsy 12/13/2019.  Diagnosis formally given on 12/27/2019 Commenced pirfenidone late October 2021/early November 2021. Screen failed for Pliant Part D trial due to soc coreg intake - Spring 2022. IPF final dx on MDD Jul 21, 2020 HPI Susan ROCKHOLD 76 y.o. -returns for follow-up.  Her husband Merry Proud is not with her today.  He is standing outside and talking on the phone apparently.  She tells me that overall she is stable.  She says she has stable dyspnea on exertion although  on the symptom score her dyspnea score is now 14 and slightly worse.  She tells me that she gets quite short of breath carrying the 12 pound dog but she categorically insist that this is the same level of dyspnea even compared to 1 year ago.  Her main issue is that for the last 1-2 months she is got insidious onset of chronic cough that is new onset and getting worse.  Happened after the last visit it is dry it happens in the middle of the day and night but at night it is worse.  It is very mild is a level 2 out of 5 at 1 out of 5.  There is no wheezing or chest pain orthopnea.    She had pulmonary function test today.  The inspiratory loop does not look all that robust the FVC appears 18% decline compared to a year and a half ago while the DLCO  is worse around 10%..  She is very surprised further decline in PFTs because she is not feeling it.  She has upcoming research consent visit for [facility] study on 05/10/2021.  The study looks at the phase 3 investigational medical product of inhaled treprostinil versus placebo.  Inhaled treprostinil has been on the market for over 20 years.  Few years ago it was approved for WHO group 3 pulmonary hypertension secondary to ILD.  In this particular study the main question is if inhaled treprostinil will be a primary agent against IPF.  She is looking forward to participating in the study she already has a consent form.  After the consent per protocol she is supposed to have a high-resolution CT chest.    SYMPTOM SCALE - ILD.td  08/19/2019  11/12/2019  12/27/2019 Last Weight  Most recent update: 12/27/2019 11:48 AM     Weight  89.7 kg (197 lb 12.8 oz)             02/04/2020 196#  04/02/2020 197# 07/23/2020 204# 01/04/2021 203# 02/11/2021 201# 04/29/2021 200#  O2 use _0  ra ra ra ra  Shortness of Breath 0 -> 5 scale with 5 being worst (score 6 If unable to do)          At rest 1 1 0 0 0 1 2 0 0  Simple tasks - showers, clothes change, eating, shaving _1 Household (dishes, doing bed, laundry) _2 Shopping _3 Walking level at own pace _4 0 _5 Walking up Stairs _6 Total (30-36) Dyspnea Score _7 How bad is your cough? x 2 0 0 0 0 0 0 2  0H0o0w bad is your fatigue x _8 0 _9 How bad is nausea x 1 0 1 1 0 0 0 0  How bad is vomiting?  x 1 0 0 0 0 0 0 0  How bad is diarrhea? x 0 0 0 0 00 4 0 0  How bad is anxiety? x 0 0 0 0 0 0 0 0  How bad is depression x 0 0 0 0 0 0 0 0  Chronic pain    0 no  4 0 0  0   Simple office walk  185 feet x  3 laps goal with forehead probe 08/19/2019  11/12/2019  12/27/19 BRL 04/02/2020 gso - she has returned her portabl o2 07/23/2020  01/04/2021   04/29/2021    O2 used _0  ra ra  Number laps completed Attempted 3 but desaturated at 2 Did alll 3 on this repeat walk - on beta blocker    2 of 3 laps 2 of 3  Comments about pace Normal pace  Nl pace avg avg slow avg  Resting Pulse Ox/HR 100% and 101/min 96% and 60/min 94% and 71/min 100% and 60 99% and 76 99% and HR  73 98% and 75  Final Pulse Ox/HR 86% and 116/min 94% and 80/min 96% and 87/min 94% ns 79 90% and HR 84 96% and HR 89 96% and HR 85  Desaturated </= 88% yes no   no no no  Desaturated <= 3% points Yes , 14 no   Yes, 9 poins Yes, 3 points no  Got Tachycardic >/= 90/min yes no   no Modrae dyspnea Mod dyspnea  Symptoms at end of test dyspneic mild none noon no    Miscellaneous comments Corrected with  2L [place] Will return o2 back to dme   no deconditined stabe    CT Chest data  No results found.    PFT  PFT Results Latest Ref Rng & Units 04/29/2021 08/23/2019  FVC-Pre L 2.13 2.61  FVC-Predicted Pre % 67 80  FVC-Post L - 2.57  FVC-Predicted Post % - 79  Pre FEV1/FVC % % 88 87  Post FEV1/FCV % % - 92  FEV1-Pre L 1.87 2.26  FEV1-Predicted Pre % 79 92  FEV1-Post L - 2.36  DLCO uncorrected ml/min/mmHg 12.28 13.84  DLCO UNC% % 58 65  DLCO corrected ml/min/mmHg 12.28 13.76  DLCO COR %Predicted % 58 64  DLVA Predicted % 96 88  TLC L - 4.15  TLC % Predicted % - 75  RV % Predicted % - 63       has a past medical history of Anemia, Asthma, CHF (congestive heart failure) (HCC), Depression, Emphysema of lung (HCC), GERD (gastroesophageal reflux disease), Hemorrhoids, History of kidney stones, Hyperlipidemia, Hypertension, Hypothyroidism, Obesity, Pre-diabetes, Rosacea, and Tubular adenoma of colon (03/26/2019).   reports that she quit smoking about 7 years ago. Her smoking use included cigarettes. She has a 7.50 pack-year smoking history. She has never used smokeless tobacco.  Past Surgical History:  Procedure Laterality Date   ABDOMINAL HYSTERECTOMY     ANTERIOR  AND POSTERIOR VAGINAL REPAIR  2020   CARDIAC CATHETERIZATION  11/01/2019   CATARACT EXTRACTION W/ INTRAOCULAR LENS IMPLANT Bilateral 2016   COLONOSCOPY     CYSTOSCOPY     DENTAL SURGERY  2016   Dental Implant   EYE SURGERY Bilateral    cataract   FRACTURE SURGERY Left 2013   Left ankle   INTERCOSTAL NERVE BLOCK Right 12/13/2019   Procedure: INTERCOSTAL NERVE BLOCK;  Surgeon: Melrose Nakayama, MD;  Location: [place];  Service: Thoracic;  Laterality: Right;   LUNG BIOPSY Right 12/13/2019   Procedure: LUNG BIOPSY;  Surgeon: Melrose Nakayama, MD;  Location: [place];  Service: Thoracic;  Laterality: Right;   LYMPH NODE BIOPSY Right 12/13/2019   Procedure: LYMPH NODE BIOPSY;  Surgeon: Melrose Nakayama, MD;  Location: [facility];  Service: Thoracic;  Laterality: Right;   medtronix nerve stimulator  07/18/2019   Complete InterStim Sacral Neuromudulation system implantation 07/18/19, [facility]  NASAL SINUS SURGERY     RIGHT/LEFT HEART CATH AND CORONARY ANGIOGRAPHY N/A 11/01/2019   Procedure: RIGHT/LEFT HEART CATH AND CORONARY ANGIOGRAPHY;  Surgeon: Martinique, Peter M, MD;  Location: [facility];  Service: Cardiovascular;  Laterality: N/A;   VAGINECTOMY, PARTIAL     VIDEO BRONCHOSCOPY N/A 12/13/2019   Procedure: VIDEO BRONCHOSCOPY;  Surgeon: Melrose Nakayama, MD;  Location: [facility];  Service: Thoracic;  Laterality: N/A;    Allergies  Allergen Reactions   Ace Inhibitors Cough   Clarithromycin     REACTION: GI sx   Codeine Nausea Only   Sulfamethoxazole Nausea Only   Telmisartan Other (See Comments)    sleepy    Immunization History  Administered Date(s) Administered   Fluad Quad(high Dose 65+) 12/09/2019, 12/11/2020   Hepatitis A, Adult 04/06/2018   Influenza Split 01/06/2009   Influenza, High Dose Seasonal PF 12/17/2014, 12/20/2015, 01/15/2017, 12/18/2017, 11/28/2018   Influenza, Seasonal, Injecte, Preservative Fre 01/07/2014, 12/17/2014   Influenza,inj,Quad PF,6+ Mos 01/15/2017    Influenza,trivalent, recombinat, inj, PF 12/03/2012   Influenza-Unspecified 01/15/2017   Moderna Covid-19 Vaccine Bivalent Booster 15yr & up 03/18/2021   Moderna SARS-COV2 Booster Vaccination 01/07/2020   Moderna Sars-Covid-2 Vaccination 04/17/2019, 05/15/2019, 08/20/2020   Pneumococcal Conjugate-13 01/07/2014   Pneumococcal Polysaccharide-23 11/23/2011   Td 03/14/2006, 10/16/2011   Zoster Recombinat (Shingrix) 10/21/2017, 12/18/2017   Zoster, Live 12/06/2013    Family History  Problem Relation Age of Onset   Heart disease Mother    Hypertension Mother    Osteoporosis Mother    Stroke Mother    Alcohol abuse Father    Diabetes Maternal Grandmother    Drug abuse Son    Breast cancer Neg Hx      Current Outpatient Medications:    amLODipine (NORVASC) 5 MG tablet, TAKE ONE TABLET BY MOUTH DAILY, Disp: 90 tablet, Rfl: 3   carvedilol (COREG) 25 MG tablet, Take 1 tablet (25 mg total) by mouth 2 (two) times daily., Disp: 180 tablet, Rfl: 3   cetirizine (ZYRTEC) 10 MG tablet, Take 10 mg by mouth daily., Disp: , Rfl:    FARXIGA 10 MG TABS tablet, Take 1 tablet (10 mg total) by mouth daily., Disp: 30 tablet, Rfl: 4   fluticasone (FLONASE) 50 MCG/ACT nasal spray, Place 2 sprays into both nostrils at bedtime as needed (allergies.). , Disp: , Rfl:    levothyroxine (SYNTHROID) 125 MCG tablet, TAKE ONE TABLET BY MOUTH DAILY, Disp: 90 tablet, Rfl: 0   montelukast (SINGULAIR) 10 MG tablet, TAKE ONE TABLET BY MOUTH EVERY NIGHT AT BEDTIME, Disp: 90 tablet, Rfl: 1   Multiple Vitamin (MULTIVITAMIN WITH MINERALS) TABS tablet, Take 1 tablet by mouth daily., Disp: , Rfl:    omeprazole (PRILOSEC) 20 MG capsule, Take 1 capsule (20 mg total) by mouth daily., Disp: 90 capsule, Rfl: 3   ondansetron (ZOFRAN-ODT) 4 MG disintegrating tablet, DISSOLVE ONE TABLET BY MOUTH EVERY 8 HOURS AS NEEDED FOR NAUSEA/ VOMITING, Disp: 20 tablet, Rfl: 0   Pirfenidone (ESBRIET) 801 MG TABS, Take 801 mg by mouth with  breakfast, with lunch, and with evening meal., Disp: 90 tablet, Rfl: 11   rosuvastatin (CRESTOR) 10 MG tablet, TAKE ONE TABLET BY MOUTH DAILY, Disp: 90 tablet, Rfl: 3   spironolactone (ALDACTONE) 25 MG tablet, Take 25 mg by mouth daily., Disp: , Rfl:    valsartan (DIOVAN) 320 MG tablet, Take 1 tablet (320 mg total) by mouth daily., Disp: 90 tablet, Rfl: 3   venlafaxine XR (EFFEXOR-XR) 150 MG  24 hr capsule, Take 1 capsule (150 mg total) by mouth daily., Disp: 90 capsule, Rfl: 3      Objective:   Vitals:   04/29/21 1531  BP: 118/78  Pulse: 77  Temp: 97.7 F (36.5 C)  TempSrc: Oral  SpO2: 95%  Weight: 200 lb (90.7 kg)  Height: _0  (1.702 m)    Estimated body mass index is 31.32 kg/m as calculated from the following:   Height as of this encounter: _1  (1.702 m).   Weight as of this encounter: 200 lb (90.7 kg).  _2 @  [employer]   04/29/21 1531  Weight: 200 lb (90.7 kg)     Physical Exam General: No distress.  Overweight Neuro: Alert and Oriented x 3. GCS 15. Speech normal Psych: Pleasant Resp:  Barrel Chest - no.  Wheeze - no, Crackles - maybe some, No overt respiratory distress CVS: Normal heart sounds. Murmurs - no Ext: Stigmata of Connective Tissue Disease - no HEENT: Normal upper airway. PEERL +. No post nasal drip        Assessment:       ICD-10-CM   1. IPF (idiopathic pulmonary fibrosis) ([place])  J84.112     2. History of rib fracture  Z87.81     3. Encounter for therapeutic drug monitoring  Z51.81     4. Dyspnea on exertion  R06.09     5. Chronic cough  R05.3      Complex diseaes requrires intensive monitoring    Plan:     Patient Instructions  IPF (idiopathic pulmonary fibrosis) (HCC) Encounter for therapeutic drug monitoring History of rib fracture - sept 2022 Dyspnea on exertion   IPF could be worse at least 10-12% compared to 18 months ago  On the other hand pulmonary function test worsening could be because of a  technical issue or splinting from prior rib fracture pain  Overall tolerating pirfenidone well  You have new onset cough -unclear why  Plan - check CBC with differential, blood IgE chemistry, liver function test - continue esbriet  as before - appreciate your interest in clinical trials - [facility] phase 3 visit consent visit scheduled for 05/10/2021 -Do standard of care spirometry and DLCO in 3 months [we can cancel this if you are progressing well with research]  Weight gain  - weight gain stable at this visit  Plan  - monitor  Followup  - 12 weeks with Dr Chase Caller on 30 min visit will after spirometry and DLCO  - Symptoms score and walk test at followup  -    SIGNATURE    Dr. Brand Males, M.D., F.C.C.P,  Pulmonary and Critical Care Medicine Staff Physician, [place] Director - Interstitial Lung Disease  Program  Pulmonary [place] at [place], [place], [address]  Pager: [phone], If no answer or between  15:00h - 7:00h: call [phone] Telephone: [phone]  4:03 PM 04/29/2021

## 2021-04-29 NOTE — Progress Notes (Signed)
Spirometry and Dlco done today.

## 2021-04-30 LAB — HEPATIC FUNCTION PANEL
ALT: 13 U/L (ref 0–35)
AST: 20 U/L (ref 0–37)
Albumin: 4.7 g/dL (ref 3.5–5.2)
Alkaline Phosphatase: 77 U/L (ref 39–117)
Bilirubin, Direct: 0.1 mg/dL (ref 0.0–0.3)
Total Bilirubin: 0.6 mg/dL (ref 0.2–1.2)
Total Protein: 7.9 g/dL (ref 6.0–8.3)

## 2021-04-30 LAB — BASIC METABOLIC PANEL
BUN: 20 mg/dL (ref 6–23)
CO2: 30 mEq/L (ref 19–32)
Calcium: 10.1 mg/dL (ref 8.4–10.5)
Chloride: 103 mEq/L (ref 96–112)
Creatinine, Ser: 1.15 mg/dL (ref 0.40–1.20)
GFR: 46.27 mL/min — ABNORMAL LOW (ref >60.00)
Glucose, Bld: 95 mg/dL (ref 70–99)
Potassium: 4.3 mEq/L (ref 3.5–5.1)
Sodium: 138 mEq/L (ref 135–145)

## 2021-04-30 LAB — CBC WITH DIFFERENTIAL/PLATELET
Basophils Absolute: 0.1 10*3/uL (ref 0.0–0.1)
Basophils Relative: 0.9 % (ref 0.0–3.0)
Eosinophils Absolute: 0.4 10*3/uL (ref 0.0–0.7)
Eosinophils Relative: 3.3 % (ref 0.0–5.0)
HCT: 46.8 % — ABNORMAL HIGH (ref 36.0–46.0)
Hemoglobin: 15.2 g/dL — ABNORMAL HIGH (ref 12.0–15.0)
Lymphocytes Relative: 24.8 % (ref 12.0–46.0)
Lymphs Abs: 2.7 10*3/uL (ref 0.7–4.0)
MCHC: 32.5 g/dL (ref 30.0–36.0)
MCV: 93.9 fl (ref 78.0–100.0)
Monocytes Absolute: 0.8 10*3/uL (ref 0.1–1.0)
Monocytes Relative: 7.2 % (ref 3.0–12.0)
Neutro Abs: 7 10*3/uL (ref 1.4–7.7)
Neutrophils Relative %: 63.8 % (ref 43.0–77.0)
Platelets: 266 10*3/uL (ref 150.0–400.0)
RBC: 4.99 Mil/uL (ref 3.87–5.11)
RDW: 13.1 % (ref 11.5–15.5)
WBC: 11.1 10*3/uL — ABNORMAL HIGH (ref 4.0–10.5)

## 2021-04-30 LAB — IGE: IgE (Immunoglobulin E), Serum: 116 kU/L — ABNORMAL HIGH (ref <=114)

## 2021-05-04 DIAGNOSIS — Z961 Presence of intraocular lens: Secondary | ICD-10-CM | POA: Diagnosis not present

## 2021-05-04 DIAGNOSIS — H43813 Vitreous degeneration, bilateral: Secondary | ICD-10-CM | POA: Diagnosis not present

## 2021-05-04 DIAGNOSIS — H02423 Myogenic ptosis of bilateral eyelids: Secondary | ICD-10-CM | POA: Diagnosis not present

## 2021-05-04 LAB — HM DIABETES EYE EXAM

## 2021-05-06 DIAGNOSIS — C44529 Squamous cell carcinoma of skin of other part of trunk: Secondary | ICD-10-CM | POA: Diagnosis not present

## 2021-05-06 DIAGNOSIS — L814 Other melanin hyperpigmentation: Secondary | ICD-10-CM | POA: Diagnosis not present

## 2021-05-06 DIAGNOSIS — D225 Melanocytic nevi of trunk: Secondary | ICD-10-CM | POA: Diagnosis not present

## 2021-05-06 DIAGNOSIS — Z85828 Personal history of other malignant neoplasm of skin: Secondary | ICD-10-CM | POA: Diagnosis not present

## 2021-05-06 DIAGNOSIS — D485 Neoplasm of uncertain behavior of skin: Secondary | ICD-10-CM | POA: Diagnosis not present

## 2021-05-06 DIAGNOSIS — L570 Actinic keratosis: Secondary | ICD-10-CM | POA: Diagnosis not present

## 2021-05-06 DIAGNOSIS — L821 Other seborrheic keratosis: Secondary | ICD-10-CM | POA: Diagnosis not present

## 2021-05-06 DIAGNOSIS — C44722 Squamous cell carcinoma of skin of right lower limb, including hip: Secondary | ICD-10-CM | POA: Diagnosis not present

## 2021-05-06 DIAGNOSIS — Z08 Encounter for follow-up examination after completed treatment for malignant neoplasm: Secondary | ICD-10-CM | POA: Diagnosis not present

## 2021-05-10 ENCOUNTER — Ambulatory Visit (INDEPENDENT_AMBULATORY_CARE_PROVIDER_SITE_OTHER)
Admission: RE | Admit: 2021-05-10 | Discharge: 2021-05-10 | Disposition: A | Discharge status: Home / Self Care | Payer: Self-pay | Source: Ambulatory Visit | Attending: Internal Medicine | Admitting: Internal Medicine

## 2021-05-10 ENCOUNTER — Encounter: Payer: Medicare Other | Admitting: *Deleted

## 2021-05-10 ENCOUNTER — Other Ambulatory Visit: Payer: Self-pay

## 2021-05-10 DIAGNOSIS — J8410 Pulmonary fibrosis, unspecified: Secondary | ICD-10-CM | POA: Diagnosis not present

## 2021-05-10 DIAGNOSIS — I700 Atherosclerosis of aorta: Secondary | ICD-10-CM | POA: Diagnosis not present

## 2021-05-10 DIAGNOSIS — J84112 Idiopathic pulmonary fibrosis: Secondary | ICD-10-CM

## 2021-05-10 DIAGNOSIS — Z006 Encounter for examination for normal comparison and control in clinical research program: Secondary | ICD-10-CM

## 2021-05-10 DIAGNOSIS — I517 Cardiomegaly: Secondary | ICD-10-CM | POA: Diagnosis not present

## 2021-05-10 DIAGNOSIS — I2510 Atherosclerotic heart disease of native coronary artery without angina pectoris: Secondary | ICD-10-CM | POA: Diagnosis not present

## 2021-05-10 NOTE — Research (Signed)
Title:A Randomized, Double-blind, Placebo-controlled, Phase 3 Study of the Efficacy and Safety of Inhaled Treprostinil in Subjects with Idiopathic Pulmonary Fibrosis   Dose and Duration of Treatment:Treprostinil for Inhalation 0.6 mg/mL, or placebo (randomly assigned 1:1) over a 52 week period.  Protocol # RIN-PF-301; IND # [id number]; Clinical Trials.gov Identifier: [id number]  Sponsor: [place], [place] [id number]  Protocol Version/Amendment: Amendment 3, date 25Apr2022 for 05/10/2021 date of double checked  yes Below information reflects this PA -  YES  Consent Version: ver.2.0  for 05/10/2021 date of double checked  yes  Psychologist, forensic Sponsor:  [employer] Version: 17 for _0  date_1  [phone] below information reflects this [employer] analyst:  Treprostinil for Inhalation, 0.6 mg/mL  Key Features of the Drug: Treprostinil (LRX-15, 15AU81, UT-15) is a stable tricyclic benzindene analogue of the naturally occurring prostacyclin (PGI2; epoprostenol), a member of the eicosanoid family of autocoids; these compounds occur widely in tissues and have important pharmacological properties with potent activity, especially on the cardiovascular system and smooth muscle ([place]). An inhalation solution of treprostinil, 0.6 mg/mL, delivered using an ultrasonic nebulizer, is currently FDA approved to treat pulmonary arterial hypertension (PAH); [WHO Group 1, PH-ILD; WHO Group 3. Inhaled treprostinil is NOW being evaluated as primary treatment of IPF, idiopathic pulmonary fibrosis.   Key Inclusion Criteria: ?76 years of age Diagnosis of IPF based on the 2018 ATS/ERS/JRS/ALAT Clinical Practice Guideline (FVC ?45% predicted at Screening).  Subjects on pirfenidone or nintedanib must be on a stable and optimized dose for ?30 days prior to Baseline. Concomitant use of both pirfenidone and nintedanib  is not permitted. Males with a partner of childbearing potential must use a condom for the duration of treatment and for at least 48 hours after discontinuing study drug.   Key Exclusion Criteria: Subject is pregnant or lactating.  FEV1/FVC <0.70 at Screening.  Prior intolerance or significant lack of efficacy to a prostacyclin or prostacyclin analogue that resulted in discontinuation or inability to effectively titrate that therapy.  The subject has received any PAH-approved therapy, including prostacyclin therapy (epoprostenol, treprostinil, iloprost, or beraprost; except for acute vasoreactivity testing), IP receptor agonists (selexipag), endothelin receptor antagonists, phosphodiesterase type 5 inhibitors (PDE5-Is), or soluble guanylate cyclase stimulators within 60 days prior to Baseline. As needed use of a PDE5-I for erectile dysfunction is permitted, provided no doses are taken within 48 hours of any study-related efficacy assessments.  Use of any of the following medications:  `     a. Azathioprine (AZA), cyclosporine, mycophenolate mofetil, tacroliumus, oral           corticosteroids (OCS) >20 mg/day or the combination of            OCS+AZA+N-acetylcysteine within 30 days prior to Baseline.        b. Cyclophosphamide within 60 days prior to Baseline        c. Rituximab within 6 months prior to Baseline   Receiving >10 L/min of oxygen at rest at Baseline.   Exacerbation of IPF or active pulmonary or upper respiratory infection within 30 days prior to Baseline. Subjects must have completed any antibiotic or steroid regimens for treatment of the infection or acute exacerbation more than 30 days prior to Baseline to be eligible. If hospitalized for an acute exacerbation of IPF or a pulmonary or upper respiratory infection, subjects must have been discharged more than 90 days prior to Baseline to be eligible.  Uncontrolled cardiac disease, defined as myocardial  infarction within 6 months prior to  Baseline or unstable angina within 30 days prior to Baseline. Use of any investigational drug/device or participation in any investigational study in which the subject received a medical intervention (ie, procedure, device, medication/supplement) within 30 days prior to Screening. Subjects participating in non-interventional, observational, or registry studies are eligible.  Life expectancy <6 months due to IPF or a concomitant illness.  Acute pulmonary embolism within 90 days prior to Baseline.   Pharmacodynamics:  (Dose-Response Relationships) - In a clinical study of 241 healthy volunteers, single doses of Tyvaso 54 ?g (the target maintenance dose per session) and 84 ?g (supratherapeutic inhalation dose) prolonged the QTc interval by approximately 10 ms. The QTc effect dissipated rapidly as the concentration of treprostinil decreased.  Pharmacokinetics: (ADME) - Treprostinil is substantially metabolized by the liver, primarily by CYP2C8.  The elimination of treprostinil (following [place] administration of treprostinil) is biphasic, with a terminal elimination t1?2 of approximately 4 hours.    Contraindications:   None  Special Warnings/Considerations: Treprostinil undergoes substantial hepatic metabolism; thus, liver impairment could result in decreased metabolism and increased systemic exposure to treprostinil potentiating the occurrence and/or severity of AEs and/or overdose. There are no adequate and well-controlled studies with Tyvaso in pregnant women. There are risks to the mother and the fetus associated with PAH. Patients who discontinue the use of any effective therapy may be expected to notice a decline in their clinical status. Patients who abruptly discontinue therapy (ie, miss 2 or more doses), or markedly reduce the dose, are at risk for worsening of PAH symptoms or a rebound in [facility]. There is no evidence to suggest or suspect that patients taking inhaled treprostinil would be at risk for  withdrawal symptoms or rebound effects.  Treprostinil inhibits platelet aggregation and increases the risk of bleeding, particularly with concomitant anticoagulants and antiplatelet therapies. Treprostinil is a pulmonary and systemic vasodilator. In patients with low systemic arterial pressure, inhaled treprostinil solution may cause symptomatic hypotension, which may present as but is not limited to light-headedness, dizziness, blurred or faded vision, and syncope. Concomitant administration of treprostinil with diuretics, antihypertensive agents, or other vasodilators that may lower blood pressure increases the risk of symptomatic hypotension. Signs and symptoms of overdose with inhaled treprostinil solution may correspond to dose-limiting pharmacologic effects, including diarrhea, flushing, headache, hypotension, nausea, and vomiting. Most events are self-limiting and resolve with reduction or withholding of inhaled treprostinil solution.  Interactions:              Drug Interaction Studies Conducted with Treprostinil: Study # P01:08: Treprostinil formulation [place] Remodulin vs Acetaminophen, to evaluate effect of acetaminophen on Treprostinil pK, showed no effect. Study # P01:12: Treprostinil formulation [place] Remodulin vs Warfarin, to evaluate effect of Warfarin on Treprostinil pK/PD, showed no effect. Study TDE-PH-105: Treprostinil formulation Orenitram vs Bosentan, to evaluate effect of Bosentan on Treprostinil pK, showed no effect. Study TDE-PH-106: Treprostinil formulation Orenitram vs Sildenafil, to evaluate effect of Sildenafil on Treprostinil pK, showed no effect. Study TDE-PH-109: Treprostinil formulation Orenitram vs Rifampicin (CYP2C8/9 inducer), to evaluate the effect of inducing the HCW2B7 metabolic pathway on the pK of Treprostinil, confirmed metabolic pathway via SEG3T5/1. Expected interaction resulting in reduced Cmax and AUC. Study TDE-PH-110: Treprostinil formulation Orenitram vs  Gemfibrozil (CYP2C8  inhibitor), to evaluate the effect of inhibiting the VOH6W7 metabolic pathway on the pK of Treprostinil, confirmed metabolic pathway primarily via CYP2C8. Expected interaction resulting in significantly increased Cmax and AUC. Study TDE-PH-110: Treprostinil formulation Orenitram vs Fluconazole (CYP2C9 inhibitor), to  evaluate the effect of inhibiting the MVH8I6 metabolic pathway on on the pK of Treprostinil, had no notable effect. Confirmed CYP2C9 plays a minor role in treprostinil metabolism.  Serious Adverse Reactions Observed in Clinical Studies with Inhaled Treprostinil: Pulmonary Arterial Hypertension: all occurrences from RIN-PH-301 study for Pulmonary Arterial Hypertension. No serious adverse reactions met criteria for inclusion in inhaled treprostinil solution clinical studies for interstitial lung disease. System/Organ Class: Nervous System disorders - Syncope in 3 of 115 subjects exposed (2.6%)      Serious Adverse Reactions Observed Postmarketing: Gastrointestinal disorders: vomiting, nausea, diarrhea, GI Hemorrhage (reflects an aggregate of the following: GI hemorrhage, rectal hemorrhage, blood in stool [hematochezia or melena], and hematemesis). General disorders and administration site conditions: Pain Infections and infestations: Pneumonia Nervous system disorders: Syncope, dizziness, headache Respiratory, thoracic and mediastinal disorders: Hemoptysis, cough, epistaxis Vascular disorders: Hypotension, hemorrhage (unspecified blood loss ranging from nondescript to heavy or excessive bleeding).  Safety Data:  As of 28 September 2020, approximately 955 subjects have received treprostinil by inhalation across various studies, ranging from acute studies in healthy volunteers to long-term treatment in subjects with PAH and PH-ILD.   The estimate of worldwide exposure to treprostinil in the marketed inhaled formulations is approximately 96,295,284 total patient treatment-days  (approximately 34,318 patient treatment-years). The most common adverse effects of inhaled treprostinil ?10% were cough, diarrhea, dizziness, flushing, headache, muscle/jaw/bone pain, nausea, pharyngolaryngeal pain, oropharyngeal pain, syncope, and throat irritation. Other adverse reactions in the observational study comparing subjects taking inhaled treprostinil and a control group were cough, hemoptysis, nasal discomfort, and throat irritation. Angioedema is a rare but serious reaction that has been seen in the post-marketing setting with treprostinil.  Stability:  Ampoules of drug product are stable until the date indicated when stored in the unopened foil pouch. The [place] commercial labeling states the following regarding storage conditions: Store at [place] to [place] (76F to 46F), with excursions permitted to 13K to 44W (73F to 78F).  Once the foil pack is opened, ampoules should be used within 7 days. Because Tyvaso is light-sensitive, unopened ampoules should be stored in the foil pouch.  [facility] [place] Coordinator note :   This visit for Subject Susan Hunt with DOB: Aug 15, 1945 on 05/10/2021 for the above protocol is Screening Visit and is for purpose of research.   The consent for this encounter is under Protocol Version Amendment 3.0 , Investigator Brochure Edition v.17, Consent Version 2.0 and is currently IRB approved.   Subject expressed continued interest and consent in continuing as a study subject. Subject confirmed that there was no change in contact information (e.g. address, telephone, email). Subject thanked for participation in research and contribution to science.  In this visit 05/10/2021 the subject will be evaluated by Principal Investigator named Dr. Chase Caller. This research coordinator has verified that the above investigator is up to date with his training logs.   The Subject was informed that the PI Dr. Chase Caller continues to have oversight of the  subject's visits and course  through relevant discussions, reviews and also specifically of this visit by routing of this note to the [place].  This visit is a key visit of  screening. The PI is available for this visit.    The subject was allowed ample time in a quiet room to review the informed consent form. This coordinator reviewed the informed consent with the subject and all questions were answered. No study related assessments or procedures were performed prior to the consent being signed.  The PI Dr. Chase Caller was present for the informed consent process and he also signed. See the Informed Consent Process Documentation Checklist in the subject's study binder for further details of the consent process. After the consent was signed, all screening visit procedures and assessments were completed per the above mentioned protocol. Refer to the subject's study binder for further details of the visit. If all criteria are met, the subject will return within the next 45 days for randomization into the study and to receive the first dose of study medication.   Signed by Hale Drone, MS, [facility]sie Coordinator [place], [place] 11:26 AM 05/10/2021

## 2021-05-16 ENCOUNTER — Telehealth: Payer: Self-pay | Admitting: Pulmonary Disease

## 2021-05-16 ENCOUNTER — Encounter: Payer: Self-pay | Admitting: Internal Medicine

## 2021-05-16 MED ORDER — MOLNUPIRAVIR EUA 200MG CAPSULE: 4.0000 | ORAL_CAPSULE | Freq: Two times a day (BID) | ORAL | 0 refills | Status: AC

## 2021-05-16 NOTE — Telephone Encounter (Signed)
Patient tested positive today for COVID ?C/O Sore throat, nasal drainage for 1-2 days ?Pt with H/O IPF on Esbriet ? ?Pt does not want to use paxlovid due to side effect. ?Molnupiravir called in to her pharmacy ? ?

## 2021-05-17 ENCOUNTER — Telehealth: Payer: Self-pay | Admitting: Family Medicine

## 2021-05-17 NOTE — Telephone Encounter (Signed)
FYI

## 2021-05-17 NOTE — Telephone Encounter (Signed)
Pt sent to triaged by phone answering service - Patient sent to ED for. Pt + covid as of 05/17/21. Sore throat,body aches and nausea. Low oxygen level.   ? ?VML for pt to know we have received message  ? ? ?Patient ?Name: ?Susan Hunt ?ANCHARD ?Gender: Female ?DOB: Jan 22, 1946 ?Age: 76 Y 58 M ?Return ?Phone ?Number: ?[phone] ?(Primary), ?[phone] ?(Secondary) ?Address: ?City/ ?State/ ?Zip: ?[place] ? 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 ?ST, cough, body aches and nauseated. ?Translation No ?Nurse Assessment ?Nurse: Dawna Part, RN, Jarrett Soho Date/Time Eilene Ghazi Time): 05/16/2021 1:05:09 PM ?Confirm and document reason for call. If ?symptomatic, describe symptoms. ?---Caller states that patient tested positive for COVID ?today. Sore throat, cough, body aches and nausea ?stared yesterday. ?Does the patient have any new or worsening ?symptoms? ---Yes ?Will a triage be completed? ---Yes ?Related visit to physician within the last 2 weeks? ---No ?Does the PT have any chronic conditions? (i.e. ?diabetes, asthma, this includes High risk factors for ?pregnancy, etc.) ?---Yes ?List chronic conditions. ---interstitial lung disease ?Is this a behavioral health or substance abuse call? ---No ?Guidelines ?Guideline Title Affirmed Question Affirmed Notes Nurse Date/Time (Eastern ?Time) ?COVID-19 - ?Diagnosed or ?Suspected ?Oxygen level (e.g., ?pulse oximetry) 90 ?percent or lower ?Dawna Part, RN, Jarrett Soho 05/16/2021 1:08:33 PM ?Disp. Time (Eastern ?Time) Disposition Final  User ?PLEASE NOTE: All timestamps contained within this report are represented as [place] Standard Time. ?CONFIDENTIALTY NOTICE: This fax transmission is intended only for the addressee. It contains information that is legally privileged, confidential or ?otherwise protected from use or disclosure. If you are not the intended recipient, you are strictly prohibited from reviewing, disclosing, copying using ?or disseminating any of this information or taking any action in reliance on or regarding this information. If you have received this fax in error, please ?notify us immediately by telephone so that we can arrange for its return to [place]. Phone: [phone], Toll-Free: [phone], Fax: [phone] ?Page: 2 of 2 ?Call Id: [id number] ?05/16/2021 1:15:08 PM Go to ED Now Yes Dawna Part, RN, Jarrett Soho ?Caller Disagree/Comply Comply ?Caller Understands Yes ?PreDisposition InappropriateToAsk ?Care Advice Given Per Guideline ?GO TO ED NOW: * You need to be seen in the Emergency Department. * Go to the ED at ___________ [place] now. ?Drive carefully. * 95 - 100%: Normal oxygen level. PULSE OXIMETRY - HOW TO INTERPRET OXYGEN LEVELS? CARE ?ADVICE given per COVID-19 - DIAGNOSED OR SUSPECTED (Adult) guideline. CALL EMS 911 IF: * Severe difficulty ?breathing occurs * Lips or face turns blue * Confusion occurs. * 91 - 94%: Mildly low oxygen level for most people. It may be ?normal for some patients with COPD. ?Comments ?User: Tedd Sias, RN Date/Time Eilene Ghazi Time): 05/16/2021 1:12:57 PM ?Current Oxygen level 90% HR 73 ?Referrals ?[facility] - ED ?

## 2021-05-18 NOTE — Telephone Encounter (Signed)
Thank you.  I have informed the study coordinator Reba Morris.  About this and this will be recorded as an adverse event unrelated to study protocol ?

## 2021-05-21 ENCOUNTER — Encounter (HOSPITAL_COMMUNITY): Payer: Medicare Other | Admitting: Cardiology

## 2021-05-21 ENCOUNTER — Other Ambulatory Visit (HOSPITAL_COMMUNITY): Payer: Medicare Other

## 2021-05-25 ENCOUNTER — Encounter: Payer: Self-pay | Admitting: Family Medicine

## 2021-05-26 ENCOUNTER — Ambulatory Visit (HOSPITAL_COMMUNITY)
Admission: RE | Admit: 2021-05-26 | Discharge: 2021-05-26 | Disposition: A | Discharge status: Home / Self Care | Payer: Medicare Other | Source: Ambulatory Visit | Attending: Internal Medicine | Admitting: Internal Medicine

## 2021-05-26 ENCOUNTER — Ambulatory Visit (HOSPITAL_BASED_OUTPATIENT_CLINIC_OR_DEPARTMENT_OTHER)
Admission: RE | Admit: 2021-05-26 | Discharge: 2021-05-26 | Disposition: A | Discharge status: Home / Self Care | Payer: Medicare Other | Source: Ambulatory Visit | Attending: Cardiology | Admitting: Cardiology

## 2021-05-26 ENCOUNTER — Encounter (HOSPITAL_COMMUNITY): Payer: Self-pay | Admitting: Internal Medicine

## 2021-05-26 ENCOUNTER — Other Ambulatory Visit: Payer: Self-pay

## 2021-05-26 VITALS — BP 140/80 | HR 68 | Wt 197.6 lb

## 2021-05-26 DIAGNOSIS — Z7982 Long term (current) use of aspirin: Secondary | ICD-10-CM | POA: Insufficient documentation

## 2021-05-26 DIAGNOSIS — I5022 Chronic systolic (congestive) heart failure: Secondary | ICD-10-CM | POA: Diagnosis not present

## 2021-05-26 DIAGNOSIS — G4719 Other hypersomnia: Secondary | ICD-10-CM

## 2021-05-26 DIAGNOSIS — I358 Other nonrheumatic aortic valve disorders: Secondary | ICD-10-CM | POA: Diagnosis not present

## 2021-05-26 DIAGNOSIS — E119 Type 2 diabetes mellitus without complications: Secondary | ICD-10-CM | POA: Insufficient documentation

## 2021-05-26 DIAGNOSIS — Z79899 Other long term (current) drug therapy: Secondary | ICD-10-CM | POA: Insufficient documentation

## 2021-05-26 DIAGNOSIS — Z8249 Family history of ischemic heart disease and other diseases of the circulatory system: Secondary | ICD-10-CM | POA: Diagnosis not present

## 2021-05-26 DIAGNOSIS — E669 Obesity, unspecified: Secondary | ICD-10-CM | POA: Diagnosis not present

## 2021-05-26 DIAGNOSIS — Z833 Family history of diabetes mellitus: Secondary | ICD-10-CM | POA: Diagnosis not present

## 2021-05-26 DIAGNOSIS — I2510 Atherosclerotic heart disease of native coronary artery without angina pectoris: Secondary | ICD-10-CM | POA: Insufficient documentation

## 2021-05-26 DIAGNOSIS — J439 Emphysema, unspecified: Secondary | ICD-10-CM | POA: Diagnosis not present

## 2021-05-26 DIAGNOSIS — I5042 Chronic combined systolic (congestive) and diastolic (congestive) heart failure: Secondary | ICD-10-CM | POA: Diagnosis not present

## 2021-05-26 DIAGNOSIS — E785 Hyperlipidemia, unspecified: Secondary | ICD-10-CM | POA: Insufficient documentation

## 2021-05-26 DIAGNOSIS — J84112 Idiopathic pulmonary fibrosis: Secondary | ICD-10-CM | POA: Diagnosis not present

## 2021-05-26 DIAGNOSIS — I5020 Unspecified systolic (congestive) heart failure: Secondary | ICD-10-CM

## 2021-05-26 DIAGNOSIS — I10 Essential (primary) hypertension: Secondary | ICD-10-CM | POA: Diagnosis not present

## 2021-05-26 DIAGNOSIS — R0683 Snoring: Secondary | ICD-10-CM | POA: Diagnosis not present

## 2021-05-26 DIAGNOSIS — I428 Other cardiomyopathies: Secondary | ICD-10-CM | POA: Insufficient documentation

## 2021-05-26 DIAGNOSIS — Z7984 Long term (current) use of oral hypoglycemic drugs: Secondary | ICD-10-CM | POA: Insufficient documentation

## 2021-05-26 DIAGNOSIS — R0600 Dyspnea, unspecified: Secondary | ICD-10-CM | POA: Diagnosis not present

## 2021-05-26 DIAGNOSIS — R5383 Other fatigue: Secondary | ICD-10-CM | POA: Diagnosis not present

## 2021-05-26 DIAGNOSIS — R0602 Shortness of breath: Secondary | ICD-10-CM | POA: Diagnosis not present

## 2021-05-26 DIAGNOSIS — I110 Hypertensive heart disease with heart failure: Secondary | ICD-10-CM | POA: Diagnosis not present

## 2021-05-26 DIAGNOSIS — Z87891 Personal history of nicotine dependence: Secondary | ICD-10-CM | POA: Insufficient documentation

## 2021-05-26 DIAGNOSIS — I700 Atherosclerosis of aorta: Secondary | ICD-10-CM | POA: Diagnosis not present

## 2021-05-26 LAB — ECHOCARDIOGRAM COMPLETE
Area-P 1/2: 3.1 cm2
Calc EF: 39.1 %
S' Lateral: 4.6 cm
Single Plane A2C EF: 44.2 %
Single Plane A4C EF: 29.7 %

## 2021-05-26 NOTE — Progress Notes (Signed)
Patient was given home sleep device and instruction to complete study.  She downloaded the App.  She will complete the study once contacted by our clinic that insurance prior authorization is completed or not needed. ?

## 2021-05-26 NOTE — Addendum Note (Signed)
Encounter addended by: Micki Riley, RN on: 05/26/2021 2:10 PM ? Actions taken: Clinical Note Signed

## 2021-05-26 NOTE — Progress Notes (Signed)
Height: 5'7"    ?Weight: 197 lb ?BMI: 30.95 ? ?Today's Date: 05/26/21 ? ?STOP BANG RISK ASSESSMENT ?S (snore) Have you been told that you snore?     YES ?  ?T (tired) Are you often tired, fatigued, or sleepy during the day?  ? YES  ?O (obstruction) Do you stop breathing, choke, or gasp during sleep? NO ?  ?P (pressure) Do you have or are you being treated for high blood pressure? YES ?  ?B (BMI) Is your body index greater than 35 kg/m? NO ?  ?A (age) Are you 91 years old or older? YES ?  ?N (neck) Do you have a neck circumference greater than 16 inches?  ?  ?  ?G (gender) Are you a female? NO ?  ?TOTAL STOP/BANG ?YES? ANSWERS 4  ? ?                                                                    For Office Use Only              Procedure Order Form    ?YES to 3+ Stop Bang questions OR two clinical symptoms - patient qualifies for WatchPAT (CPT 95800)     ? ?Clinical Notes: Will consult Sleep Specialist and refer for management of therapy due to patient increased risk of Sleep Apnea. Ordering a sleep study due to the following two clinical symptoms: Excessive daytime sleepiness G47.10 /  Loud snoring R06.83  ? ? ? ? ?

## 2021-05-26 NOTE — Addendum Note (Signed)
Encounter addended by: Scarlette Calico, RN on: 05/26/2021 12:08 PM ? Actions taken: Visit diagnoses modified, Order list changed, Diagnosis association updated, Clinical Note Signed

## 2021-05-26 NOTE — Progress Notes (Signed)
? ?ADVANCED HF CLINIC NOTE ? ?Referring Physician: Dr Margaretann Loveless  ?Primary Care: Dr Rosendo Gros  ?Primary Cardiologist: Dr Margaretann Loveless ? 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  ? ?She has a history of NICM, HTN, hyperlipidemia, ILD with mild pulmonary fibrosis, and HFrEF. ? ?Previously a smoker 1 pack/week for 10 years. Quit ~ 2005.  ? ?Has had progressive SOB for  > 1 year. Found to have mild pulmonary pulmonary fibrosis. Prior to lung biopsy had echo 7/21 EF 30-35%. Then had R/L cath mild CAD and normal pulmonary pressures. Underwent lung biopsy 10/21 which confirmed UIP. ? ?Has been referred to Dr. Margaretann Loveless and had GDMT titrated. Now on valsartan 320 daily, carvedilol 25 bid, Farxiga 10, spiro 25 daily  ? ?cMRI 3/22 LVEF 41% RVEF 41% . No scar, inflammation of infiltrative process.  ? ?Previous sleep study negative > 10 years ago.  CTD serologies negative 6/21 ? ?Echo today 3/23 EF 30-35%  ? ?Returns for f/u. Continues to follow in IPF Clinic with Dr. Chase Caller. Says breathing has been getting worse. PFTs nearly 20% worse despite Esbriet. No edema, orthopnea or PND. Working in yard SBP 115-120. + snoring does not sleep well. + daytime fatigue  ? ? ?PFTs 2/23  ?FEV1 1.87L (79%) ?FVC 2.13 (67%) ?DLCO 58% ? ?Hi-res CT 4/22: Stable IPF  ? ?PFTs 08/23/19 ?FEV1 2.26L (92%) ?FVC 2.61 (80%) ?DLCO 64% ?  ?Previously tried:  ?Entresto 49/51 mg (dry/itchy mouth, dry eyes) ?Lisinopril-hctz (SOB, coughing) ?Metoprolol tartrate (SOB, coughing) ?Clonidine ? ?Cardiac Testing  ?05/2020 CMRI LV EF 41% RVEF 41% . No scar, inflammation of infiltrative process.  ?02/2020 Echo Ef 30-35%  ? ?10/2019 RHC RA 1 RV 23/2 PA 22/3 (11) PCWP 8 CO 6.6 CI 3.3  ? ?Past Medical History:  ?Diagnosis Date  ? Anemia   ? Asthma   ? "I dont think I have it.  ? CHF (congestive heart failure) ([place])   ? Depression   ? Emphysema of lung ([place])   ? GERD (gastroesophageal reflux  disease)   ? Hemorrhoids   ? History of kidney stones   ? Cystoscopy  ? Hyperlipidemia   ? Hypertension   ? Hypothyroidism   ? Obesity   ? Pre-diabetes   ? Rosacea   ? Tubular adenoma of colon 03/26/2019  ? Colonoscopy repeat in 3 years  ? ? ?Current Outpatient Medications  ?Medication Sig Dispense Refill  ? amLODipine (NORVASC) 5 MG tablet TAKE ONE TABLET BY MOUTH DAILY 90 tablet 3  ? carvedilol (COREG) 25 MG tablet Take 1 tablet (25 mg total) by mouth 2 (two) times daily. 180 tablet 3  ? cetirizine (ZYRTEC) 10 MG tablet Take 10 mg by mouth daily.    ? FARXIGA 10 MG TABS tablet Take 1 tablet (10 mg total) by mouth daily. 30 tablet 4  ? fluticasone (FLONASE) 50 MCG/ACT nasal spray Place 2 sprays into both nostrils at bedtime as needed (allergies.).     ? levothyroxine (SYNTHROID) 125 MCG tablet TAKE ONE TABLET BY MOUTH DAILY 90 tablet 0  ? montelukast (SINGULAIR) 10 MG tablet TAKE ONE TABLET BY MOUTH EVERY NIGHT AT BEDTIME 90 tablet 1  ? Multiple Vitamin (MULTIVITAMIN WITH MINERALS) TABS tablet Take 1 tablet by mouth daily.    ? omeprazole (PRILOSEC) 20 MG capsule Take 1 capsule (20 mg total) by mouth daily. 90 capsule 3  ? ondansetron (ZOFRAN-ODT) 4 MG disintegrating  tablet DISSOLVE ONE TABLET BY MOUTH EVERY 8 HOURS AS NEEDED FOR NAUSEA/ VOMITING 20 tablet 0  ? Pirfenidone (ESBRIET) 801 MG TABS Take 801 mg by mouth with breakfast, with lunch, and with evening meal. 90 tablet 11  ? rosuvastatin (CRESTOR) 10 MG tablet TAKE ONE TABLET BY MOUTH DAILY 90 tablet 3  ? spironolactone (ALDACTONE) 25 MG tablet Take 25 mg by mouth daily.    ? valsartan (DIOVAN) 320 MG tablet Take 1 tablet (320 mg total) by mouth daily. 90 tablet 3  ? venlafaxine XR (EFFEXOR-XR) 150 MG 24 hr capsule Take 1 capsule (150 mg total) by mouth daily. 90 capsule 3  ? ?No current facility-administered medications for this encounter.  ? ? ?Allergies  ?Allergen Reactions  ? Ace Inhibitors Cough  ? Clarithromycin   ?  REACTION: GI sx  ? Codeine Nausea  Only  ? Sulfamethoxazole Nausea Only  ? Telmisartan Other (See Comments)  ?  sleepy  ? ? ?  ?Social History  ? ?Socioeconomic History  ? Marital status: Married  ?  Spouse name: Not on file  ? Number of children: Not on file  ? Years of education: Not on file  ? Highest education level: Not on file  ?Occupational History  ? Not on file  ?Tobacco Use  ? Smoking status: Former  ?  Packs/day: 0.50  ?  Years: 15.00  ?  Pack years: 7.50  ?  Types: Cigarettes  ?  Quit date: 08/18/2013  ?  Years since quitting: 7.7  ? Smokeless tobacco: Never  ?Vaping Use  ? Vaping Use: Never used  ?Substance and Sexual Activity  ? Alcohol use: Not Currently  ?  Alcohol/week: 1.0 standard drink  ?  Types: 1 Glasses of wine per week  ?  Comment: 2-3 glasses of wine weekly  ? Drug use: No  ? Sexual activity: Yes  ?Other Topics Concern  ? Not on file  ?Social History Narrative  ? Not on file  ? ?Social Determinants of Health  ? ?Financial Resource Strain: Not on file  ?Food Insecurity: Not on file  ?Transportation Needs: Not on file  ?Physical Activity: Not on file  ?Stress: Not on file  ?Social Connections: Not on file  ?Intimate Partner Violence: Not on file  ? ? ?  ?Family History  ?Problem Relation Age of Onset  ? Heart disease Mother   ? Hypertension Mother   ? Osteoporosis Mother   ? Stroke Mother   ? Alcohol abuse Father   ? Diabetes Maternal Grandmother   ? Drug abuse Son   ? Breast cancer Neg Hx   ? ? ?Vitals:  ? 05/26/21 1125  ?BP: 140/80  ?Pulse: 68  ?SpO2: 95%  ?Weight: 89.6 kg (197 lb 9.6 oz)  ? ? ?Wt Readings from Last 3 Encounters:  ?05/26/21 89.6 kg (197 lb 9.6 oz)  ?04/29/21 90.7 kg (200 lb)  ?02/11/21 91.2 kg (201 lb)  ? ? ?PHYSICAL EXAM: ?General:  Well appearing. No resp difficulty ?HEENT: normal ?Neck: supple. no JVD. Carotids 2+ bilat; no bruits. No lymphadenopathy or thryomegaly appreciated. ?Cor: PMI nondisplaced. Regular rate & rhythm. No rubs, gallops or murmurs. ?Lungs: decreased throughout mild crackles ?Abdomen:  obese soft, nontender, nondistended. No hepatosplenomegaly. No bruits or masses. Good bowel sounds. ?Extremities: no cyanosis, clubbing, rash, edema ?Neuro: alert & orientedx3, cranial nerves grossly intact. moves all 4 extremities w/o difficulty. Affect pleasant  ? ? ? ?ASSESSMENT & PLAN: ? ?1. Chronic Systolic Heart Failure due  to NICM ?- Echo 02/2020 EF 40% RV nomral  ?- cMRI 05/2020 LVEF 41% RVEF 42% . No scar,fibrosis, inflammation, or infiltrative process.  ?- cath 7/21 non-obstructive CAD ?- Echo today 3/23 EF 30-35%  ?- Etiology remains unclear . Denies FHx of HF.  ?- On excellent GDMT so not much more to add ?- Continue carvedilol 25 bid ?- Continue Farxiga 10 ?- Continue valsartan 320 daily (didn't tolerate Entresto) ?- Continue spiro 25 ?- Will get HST  ?- Will refer for ICD. ?- Can consider CPX testing down the road to try and understand if her progressive SOB is more a lung of HF imitation. Currently I think lung predominates given recent change in PFTs ? ?2. IPF, progressive ?- followed by Dr. Chase Caller ?- PFTs worsening but hi-res CT 2/23 stable ?- no PAH on RHC so does not qualify for Tyvaso ?- Continue Esbreit  ? ?3. CAD, nonobstructive by cath 7/21 ?- no s/s angina  ?- continue ASA/statin per Dr. Margaretann Loveless ? ?4. HTN ?- Blood pressure well controlled at home ? ?Glori Bickers, MD  ?11:44 AM ? ?

## 2021-05-26 NOTE — Patient Instructions (Signed)
Your provider has recommended that you have a home sleep study.  We have provided you with the equipment in our office today. Please download the app and follow the instructions. YOUR PIN NUMBER IS: [id number]. Once you have completed the test you just dispose of the equipment, the information is automatically uploaded to [place] via blue-tooth technology. If your test is positive for sleep apnea and you need a home CPAP machine you will be contacted by Dr Theodosia Blender office [facility]) to set this up. ? ?You have been referred to [facility] for a defibrillator, they will call you for an appointment ? ?Your physician recommends that you schedule a follow-up appointment in: 6 months (Sept 2023), **PLEASE CALL OUR OFFICE IN July TO SCHEDULE THIS APPOINTMENT ? ?If you have any questions or concerns before your next appointment please send [place] a message through [place] or call our office at [phone].   ? ?TO LEAVE A MESSAGE FOR THE NURSE SELECT OPTION 2, PLEASE LEAVE A MESSAGE INCLUDING: ?YOUR NAME ?DATE OF BIRTH ?CALL BACK NUMBER ?REASON FOR CALL**this is important as we prioritize the call backs ? ?YOU WILL RECEIVE A CALL BACK THE SAME DAY AS LONG AS YOU CALL BEFORE 4:00 PM ? ? ? ?

## 2021-05-26 NOTE — Addendum Note (Signed)
Encounter addended by: Scarlette Calico, RN on: 05/26/2021 12:12 PM ? Actions taken: Clinical Note Signed

## 2021-05-26 NOTE — Progress Notes (Signed)
?  Echocardiogram ?2D Echocardiogram has been performed. ? ?Bobbye [place] ?05/26/2021, 10:55 AM ?

## 2021-05-28 ENCOUNTER — Institutional Professional Consult (permissible substitution): Payer: Medicare Other | Admitting: Cardiology

## 2021-06-01 ENCOUNTER — Encounter: Payer: Self-pay | Admitting: Cardiology

## 2021-06-01 ENCOUNTER — Ambulatory Visit: Payer: Medicare Other

## 2021-06-01 DIAGNOSIS — G4719 Other hypersomnia: Secondary | ICD-10-CM

## 2021-06-01 DIAGNOSIS — R0683 Snoring: Secondary | ICD-10-CM

## 2021-06-01 DIAGNOSIS — I5022 Chronic systolic (congestive) heart failure: Secondary | ICD-10-CM

## 2021-06-01 DIAGNOSIS — G4733 Obstructive sleep apnea (adult) (pediatric): Secondary | ICD-10-CM

## 2021-06-01 NOTE — Procedures (Signed)
? ?  Sleep Study Report ?Patient Information ?Study Date: 06/01/21 ?Patient Name: Susan Hunt ?Patient ID: [id number] ?Birth Date: [birth year] ?Age: 76 ?Gender: Female ?Referring Physician: Glori Bickers, MD ? ?TEST DESCRIPTION: Home sleep apnea testing was completed using the WatchPat, a Type 1 device, utilizing ?peripheral arterial tonometry (PAT), chest movement, actigraphy, pulse oximetry, pulse rate, body position and snore. ?AHI was calculated with apnea and hypopnea using valid sleep time as the denominator. RDI includes apneas, ?hypopneas, and RERAs. The data acquired and the scoring of sleep and all associated events were performed in ?accordance with the recommended standards and specifications as outlined in the AASM Manual for the Scoring of ?Sleep and Associated Events 2.2.0 (2015). ? ?FINDINGS: ?1. Mild Obstructive Sleep Apnea with AHI 13.5/hr. ?2. No Central Sleep Apnea with pAHIc 1.2/hr. ?3. Oxygen desaturations as low as 87%. ?4. Severe snoring was present. O2 sats were < 88% for 0.5 min. ?5. Total sleep time was hrs and min. ?6. 16.5% of total sleep time was spent in REM sleep. ?7. Normal sleep onset latency at 16 min. ?8. Shortened REM sleep onset latency at 47 min. ?9. Total awakenings were 11. ? ?DIAGNOSIS: ?Mild Obstructive Sleep Apnea (G47.33) ? ?RECOMMENDATIONS: ?1. Clinical correlation of these findings is necessary. The decision to treat obstructive sleep apnea (OSA) is usually ?based on the presence of apnea symptoms or the presence of associated medical conditions such as Hypertension, ?Congestive Heart Failure, Atrial Fibrillation or Obesity. The most common symptoms of OSA are snoring, gasping for ?breath while sleeping, daytime sleepiness and fatigue. ? ?2. Initiating apnea therapy is recommended given the presence of symptoms and/or associated conditions. ?Recommend proceeding with one of the following: ? ? a. Auto-CPAP therapy with a pressure range of 5-20cm H2O. ? ? b. An oral  appliance (OA) that can be obtained from certain dentists with expertise in sleep medicine. These are ?primarily of use in non-obese patients with mild and moderate disease. ? ? c. An ENT consultation which may be useful to look for specific causes of obstruction and possible treatment ?options. ? ? d. If patient is intolerant to PAP therapy, consider referral to ENT for evaluation for hypoglossal nerve stimulator. ? ?3. Close follow-up is necessary to ensure success with CPAP or oral appliance therapy for maximum benefit . ? ?4. A follow-up oximetry study on CPAP is recommended to assess the adequacy of therapy and determine the need ?for supplemental oxygen or the potential need for Bi-level therapy. An arterial blood gas to determine the adequacy of ?baseline ventilation and oxygenation should also be considered. ? ?5. Healthy sleep recommendations include: adequate nightly sleep (normal 7-9 hrs/night), avoidance of caffeine after ?noon and alcohol near bedtime, and maintaining a sleep environment that is cool, dark and quiet. ? ?6. Weight loss for overweight patients is recommended. Even modest amounts of weight loss can significantly ?improve the severity of sleep apnea. ? ?7. Snoring recommendations include: weight loss where appropriate, side sleeping, and avoidance of alcohol before ?bed. ? ?8. Operation of motor vehicle should be avoided when sleepy. ? ?Signature: ?Electronically Signed: 06/01/21 ?Fransico Him, MD; [facility]; [place], [place] Board of Sleep Medicine ? ?

## 2021-06-08 ENCOUNTER — Telehealth: Payer: Self-pay | Admitting: *Deleted

## 2021-06-08 NOTE — Telephone Encounter (Signed)
-----  Message from Sueanne Margarita, MD sent at 06/01/2021  4:49 PM EDT ----- ?Please let patient know that they have sleep apnea and recommend treating with CPAP.  Please order an auto CPAP from 4-15cm H2O with heated humidity and mask of choice.  Order overnight pulse ox on CPAP.  Followup with me in 6 weeks.  ?  ?

## 2021-06-08 NOTE — Telephone Encounter (Signed)
Patient notified of HST results and recommendations. She agrees to proceed with CPAP therapy. Order sent to [facility]. ?

## 2021-06-11 ENCOUNTER — Other Ambulatory Visit: Payer: Self-pay | Admitting: Family Medicine

## 2021-06-11 DIAGNOSIS — I10 Essential (primary) hypertension: Secondary | ICD-10-CM

## 2021-06-14 ENCOUNTER — Encounter: Payer: Self-pay | Admitting: Family Medicine

## 2021-06-14 ENCOUNTER — Ambulatory Visit (INDEPENDENT_AMBULATORY_CARE_PROVIDER_SITE_OTHER): Payer: Medicare Other | Admitting: Family Medicine

## 2021-06-14 VITALS — BP 118/76 | HR 73 | Temp 97.6°F | Ht 67.0 in | Wt 197.8 lb

## 2021-06-14 DIAGNOSIS — E119 Type 2 diabetes mellitus without complications: Secondary | ICD-10-CM

## 2021-06-14 DIAGNOSIS — Z780 Asymptomatic menopausal state: Secondary | ICD-10-CM | POA: Diagnosis not present

## 2021-06-14 DIAGNOSIS — J431 Panlobular emphysema: Secondary | ICD-10-CM | POA: Diagnosis not present

## 2021-06-14 DIAGNOSIS — R3911 Hesitancy of micturition: Secondary | ICD-10-CM | POA: Diagnosis not present

## 2021-06-14 DIAGNOSIS — I10 Essential (primary) hypertension: Secondary | ICD-10-CM | POA: Diagnosis not present

## 2021-06-14 DIAGNOSIS — I5020 Unspecified systolic (congestive) heart failure: Secondary | ICD-10-CM

## 2021-06-14 DIAGNOSIS — J849 Interstitial pulmonary disease, unspecified: Secondary | ICD-10-CM

## 2021-06-14 DIAGNOSIS — G4733 Obstructive sleep apnea (adult) (pediatric): Secondary | ICD-10-CM | POA: Diagnosis not present

## 2021-06-14 DIAGNOSIS — M85859 Other specified disorders of bone density and structure, unspecified thigh: Secondary | ICD-10-CM

## 2021-06-14 LAB — POCT GLYCOSYLATED HEMOGLOBIN (HGB A1C): HbA1c, POC (controlled diabetic range): 6.1 % (ref 0.0–7.0)

## 2021-06-14 LAB — URINALYSIS, ROUTINE W REFLEX MICROSCOPIC
Bilirubin Urine: NEGATIVE
Hgb urine dipstick: NEGATIVE
Ketones, ur: NEGATIVE
Leukocytes,Ua: NEGATIVE
Nitrite: NEGATIVE
RBC / HPF: NONE SEEN (ref 0–?)
Specific Gravity, Urine: 1.02 (ref 1.000–1.030)
Total Protein, Urine: NEGATIVE
Urine Glucose: >=1000 — AB
Urobilinogen, UA: 0.2 (ref 0.0–1.0)
pH: 6 (ref 5.0–8.0)

## 2021-06-14 NOTE — Progress Notes (Signed)
? ?Subjective  ?CC:  ?Chief Complaint  ?Patient presents with  ? Diabetes  ?  Pt has no concerns today.   ? ? ?HPI: Susan Hunt is a 76 y.o. female who presents to the office today for follow up of diabetes and problems listed above in the chief complaint.  ?Diabetes follow up: Her diabetic control is reported as Unchanged. Eats too much sugar. On farxiga for heart failure. Last GFR 47.  She denies exertional CP or SOB or symptomatic hypoglycemia. She denies foot sores or paresthesias.  ?Reviewed pulm and cardiology notes for heart failure and ILD. Reviewed sleep study; awaiting cpap for mild osa.  ?Sob is worsening. ?Mood is stable.  ? ?Wt Readings from Last 3 Encounters:  ?06/14/21 197 lb 12.8 oz (89.7 kg)  ?05/26/21 197 lb 9.6 oz (89.6 kg)  ?04/29/21 200 lb (90.7 kg)  ? ? ?BP Readings from Last 3 Encounters:  ?06/14/21 118/76  ?05/26/21 140/80  ?04/29/21 118/78  ? ? ?Assessment  ?1. Controlled type 2 diabetes mellitus without complication, without long-term current use of insulin ([place])   ?2. Essential hypertension   ?3. HFrEF (heart failure with reduced ejection fraction) ([place])   ?4. Mild obstructive sleep apnea   ?5. ILD (interstitial lung disease) ([place])   ?6. Panlobular emphysema ([facility]) Chronic  ?7. Urinary hesitancy   ?8. Osteopenia of neck of femur, unspecified laterality   ?9. Asymptomatic menopausal state   ? ?  ?Plan  ?Diabetes is currently very well controlled. Cont farxiga. Check urine.  ?HTN and heart failure are stable per cards.  ?ILD and sleep apnea per pulm ?Mood on effexor is stable.  ?Urinary sxs: check for infection; treat if positive.  ? ?Follow up: 6 mo for cpe. ?Orders Placed This Encounter  ?Procedures  ? Urine Culture  ? DG Bone Density  ? Microalbumin / creatinine urine ratio  ? Urinalysis, Routine w reflex microscopic  ? POCT HgB A1C  ? ?No orders of the defined types were placed in this encounter. ? ?  ? ?Immunization History  ?Administered Date(s) Administered  ? Fluad Quad(high  Dose 65+) 12/09/2019, 12/11/2020  ? Hepatitis A, Adult 04/06/2018  ? Influenza Split 01/06/2009  ? Influenza, High Dose Seasonal PF 12/17/2014, 12/20/2015, 01/15/2017, 12/18/2017, 11/28/2018  ? Influenza, Seasonal, Injecte, Preservative Fre 01/07/2014, 12/17/2014  ? Influenza,inj,Quad PF,6+ Mos 01/15/2017  ? Influenza,trivalent, recombinat, inj, PF 12/03/2012  ? Influenza-Unspecified 01/15/2017  ? Moderna Covid-19 Vaccine Bivalent Booster 90yr & up 03/18/2021  ? Moderna SARS-COV2 Booster Vaccination 01/07/2020  ? Moderna Sars-Covid-2 Vaccination 04/17/2019, 05/15/2019, 08/20/2020  ? Pneumococcal Conjugate-13 01/07/2014  ? Pneumococcal Polysaccharide-23 11/23/2011  ? Td 03/14/2006, 10/16/2011  ? Zoster Recombinat (Shingrix) 10/21/2017, 12/18/2017  ? Zoster, Live 12/06/2013  ? ? ?Diabetes Related Lab Review: ?Lab Results  ?Component Value Date  ? HGBA1C 6.1 06/14/2021  ? HGBA1C 6.2 (H) 12/11/2020  ? HGBA1C 6.1 (A) 06/10/2020  ?  ?Lab Results  ?Component Value Date  ? MICROALBUR <0.7 07/19/2019  ? ?Lab Results  ?Component Value Date  ? CREATININE 1.15 04/29/2021  ? BUN 20 04/29/2021  ? NA 138 04/29/2021  ? K 4.3 04/29/2021  ? CL 103 04/29/2021  ? CO2 30 04/29/2021  ? ?Lab Results  ?Component Value Date  ? CHOL 159 12/11/2020  ? CHOL 155 12/09/2019  ? CHOL 154 07/19/2019  ? ?Lab Results  ?Component Value Date  ? HDL 52 12/11/2020  ? HDL 65 12/09/2019  ? HDL 64.20 07/19/2019  ? ?Lab Results  ?  Component Value Date  ? [place] 81 12/11/2020  ? [place] 73 12/09/2019  ? [place] 61 07/19/2019  ? ?Lab Results  ?Component Value Date  ? TRIG 163 (H) 12/11/2020  ? TRIG 87 12/09/2019  ? TRIG 142.0 07/19/2019  ? ?Lab Results  ?Component Value Date  ? CHOLHDL 3.1 12/11/2020  ? CHOLHDL 2.4 12/09/2019  ? CHOLHDL 2 07/19/2019  ? ?Lab Results  ?Component Value Date  ? LDLDIRECT 146.7 05/29/2006  ? ?The 10-year ASCVD risk score (Arnett DK, et al., 2019) is: 34.8% ?  Values used to calculate the score: ?    Age: 70 years ?    Sex:  Female ?    Is Non-Hispanic African American: No ?    Diabetic: Yes ?    Tobacco smoker: No ?    Systolic Blood Pressure: 194 mmHg ?    Is BP treated: Yes ?    HDL Cholesterol: 52 mg/dL ?    Total Cholesterol: 159 mg/dL ?I have reviewed the [place], Fam and Soc history. ?Patient Active Problem List  ? Diagnosis Date Noted  ? ILD (interstitial lung disease) ([place]) 11/01/2019  ?  Priority: High  ? HFrEF (heart failure with reduced ejection fraction) ([place]) 11/01/2019  ?  Priority: High  ? Controlled type 2 diabetes mellitus without complication, without long-term current use of insulin ([place]) 07/19/2019  ?  Priority: High  ? Tubular adenoma of colon 03/26/2019  ?  Priority: High  ?  Colonoscopy repeat in 3 years ?  ? Panlobular emphysema ([place]) 06/17/2014  ?  Priority: High  ?  Overview:  ?Long-term former smoker, chest CT March 2016 shows early fibrotic changes and apices and emphysematous changes.  Inhalers started ?  ? Major depression, chronic 01/28/2014  ?  Priority: High  ? Obesity (BMI 30.0-34.9) 07/15/2013  ?  Priority: High  ? Acquired hypothyroidism 04/11/2007  ?  Priority: High  ? Mixed hyperlipidemia 04/11/2007  ?  Priority: High  ? Essential hypertension 04/11/2007  ?  Priority: High  ? Recurrent UTI 04/12/2019  ?  Priority: Medium   ?  Daily prophylaxis started with Macrobid January 2021 ?  ? Hepatic steatosis 03/26/2019  ?  Priority: Medium   ? Osteopenia of femoral neck 05/24/2016  ?  Priority: Medium   ?  Dexa 05/2019 T = - 1.7 femur. Stable. Recheck 2-3 years. ?Dexa 05/2016 T = -1.4; stable. No significant decrease. Recheck in 2-3 years. ?  ? RENAL CALCULUS, HX OF 04/11/2007  ?  Priority: Medium   ? ACE-inhibitor cough 10/02/2017  ?  Priority: Low  ? Postmenopausal symptoms 02/14/2017  ?  Priority: Low  ?  Uses clonidine prn at night to help her sleep IF needed for hot flushes or poor sleep. Started by GYN years ago. occasional use. ?  ? Grade II hemorrhoids 05/03/2016  ?  Priority: Low  ? Mild obstructive  sleep apnea 05/06/2014  ?  Priority: Low  ?  Overview:  ?Negative sleep study 2013 ?Mild OA 05/2021 ?  ? Allergic rhinitis 12/18/2009  ?  Priority: Low  ?  Overview:  ?10/1 IMO update ?  ? Rosacea 04/11/2007  ?  Priority: Low  ?  Qualifier: Diagnosis of  ?By: Hulan Saas, CMA (AAMA), Larene Beach S ? ?  ? URINARY INCONTINENCE 04/11/2007  ?  Priority: Low  ?  Qualifier: Diagnosis of  ?ByRegis Bill MD, Standley Brooking ? ?  ? Research study patient 06/24/2020  ? Chronic vertigo 07/19/2019  ? ? ?  Social History: ?Patient  reports that she quit smoking about 7 years ago. Her smoking use included cigarettes. She has a 7.50 pack-year smoking history. She has never used smokeless tobacco. She reports that she does not currently use alcohol after a past usage of about 1.0 standard drink per week. She reports that she does not use drugs. ? ?Review of Systems: ?Ophthalmic: negative for eye pain, loss of vision or double vision ?Cardiovascular: negative for chest pain ?Respiratory: negative for SOB or persistent cough ?Gastrointestinal: negative for abdominal pain ?Genitourinary: negative for dysuria or gross hematuria ?MSK: negative for foot lesions ?Neurologic: negative for weakness or gait disturbance ? ?Objective  ?Vitals: BP 118/76   Pulse 73   Temp 97.6 ?F (36.4 ?C) (Temporal)   Ht _0  (1.702 m)   Wt 197 lb 12.8 oz (89.7 kg)   SpO2 94%   BMI 30.98 kg/m?  ?General: well appearing, no acute distress  ?Psych:  Alert and oriented, normal mood and affect ?HEENT:  Normocephalic, atraumatic, moist mucous membranes, supple neck  ?Cardiovascular:  Nl S1 and S2, RRR without murmur, gallop or rub. no edema ?Respiratory:  Good breath sounds bilaterally, CTAB with normal effort, no rales ?Nl foot exam ? ? ? ?Diabetic education: ongoing education regarding chronic disease management for diabetes was given today. We continue to reinforce the ABC's of diabetic management: A1c (<7 or 8 dependent upon patient), tight blood pressure control, and  cholesterol management with goal LDL < 100 minimally. We discuss diet strategies, exercise recommendations, medication options and possible side effects. At each visit, we review recommended immunizations and preventive car

## 2021-06-14 NOTE — Patient Instructions (Addendum)
Please return in 6 months for your annual complete physical; please come fasting.  ? ?I will let you know your urine test results soon.  ? ?If you have any questions or concerns, please don't hesitate to send me a message via MyChart or call the office at [phone]. Thank you for visiting with [place] today! It's our pleasure caring for you.  ? ?I have ordered a mammogram and/or bone density for you; both are due in May: ?_0   Mammogram  ?_1   Bone Density ? ?Please call the office checked below to schedule your appointment: ? ?_2   [facility]     ? [place], [place]       ? [phone]        ? ?_3   [facility] ? [place]  ? [place], [place] ? [phone] ? ?

## 2021-06-15 ENCOUNTER — Encounter: Payer: Self-pay | Admitting: Family Medicine

## 2021-06-15 ENCOUNTER — Telehealth: Payer: Self-pay

## 2021-06-15 LAB — URINE CULTURE
MICRO NUMBER:: 13214356
SPECIMEN QUALITY:: ADEQUATE

## 2021-06-15 LAB — MICROALBUMIN / CREATININE URINE RATIO
Creatinine,U: 116.7 mg/dL
Microalb Creat Ratio: 2.1 mg/g (ref 0.0–30.0)
Microalb, Ur: 2.4 mg/dL — ABNORMAL HIGH (ref 0.0–1.9)

## 2021-06-15 NOTE — Telephone Encounter (Signed)
Left patient vm that we have found her script from [facility] eye care.  I have left up front for pick up.

## 2021-06-16 ENCOUNTER — Other Ambulatory Visit: Payer: Self-pay | Admitting: Family Medicine

## 2021-06-16 DIAGNOSIS — C44722 Squamous cell carcinoma of skin of right lower limb, including hip: Secondary | ICD-10-CM | POA: Diagnosis not present

## 2021-06-16 DIAGNOSIS — Z1231 Encounter for screening mammogram for malignant neoplasm of breast: Secondary | ICD-10-CM

## 2021-06-16 DIAGNOSIS — D045 Carcinoma in situ of skin of trunk: Secondary | ICD-10-CM | POA: Diagnosis not present

## 2021-06-25 ENCOUNTER — Ambulatory Visit: Payer: Medicare Other | Admitting: Internal Medicine

## 2021-06-25 ENCOUNTER — Encounter: Payer: Self-pay | Admitting: Internal Medicine

## 2021-06-25 VITALS — BP 130/76 | HR 74 | Ht 68.0 in | Wt 199.6 lb

## 2021-06-25 DIAGNOSIS — I5020 Unspecified systolic (congestive) heart failure: Secondary | ICD-10-CM

## 2021-06-25 NOTE — Patient Instructions (Signed)
Medication Instructions:  ?Your physician recommends that you continue on your current medications as directed. Please refer to the Current Medication list given to you today. ? ?*If you need a refill on your cardiac medications before your next appointment, please call your pharmacy* ? ? ?Lab Work: ?None ordered. ? ?If you have labs (blood work) drawn today and your tests are completely normal, you will receive your results only by: ?MyChart Message (if you have MyChart) OR ?A paper copy in the mail ?If you have any lab test that is abnormal or we need to change your treatment, we will call you to review the results. ? ? ?Testing/Procedures: ?None ordered. ? ? ? ?Follow-Up: ?At [facility], you and your health needs are our priority.  As part of our continuing mission to provide you with exceptional heart care, we have created designated Provider Care Teams.  These Care Teams include your primary Cardiologist (physician) and Advanced Practice Providers (APPs -  Physician Assistants and Nurse Practitioners) who all work together to provide you with the care you need, when you need it. ? ?We recommend signing up for the patient portal called "MyChart".  Sign up information is provided on this After Visit Summary.  MyChart is used to connect with patients for Virtual Visits (Telemedicine).  Patients are able to view lab/test results, encounter notes, upcoming appointments, etc.  Non-urgent messages can be sent to your provider as well.   ?To learn more about what you can do with MyChart, go to [email or web address].   ? ?Your next appointment:   ?Follow up as needed with Dr Caryl Comes ? ?Important Information About Sugar ? ? ? ? ?  ?

## 2021-06-25 NOTE — Progress Notes (Signed)
? ? ? ? ?ELECTROPHYSIOLOGY CONSULT NOTE  ?Patient ID: Susan Hunt, MRN: [id number], DOB/AGE: 10/06/1945 77 y.o. ?Admit date: (Not on file) ?Date of Consult: 06/25/2021 ? ?Primary Physician: Leamon Arnt, MD ?Primary Cardiologist: DB ?  ?  ?Susan Hunt is a 76 y.o. female who is being seen today for the evaluation of ICD at the request of DB.  ? ? ?HPI ?Susan Hunt is a 76 y.o. 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 ? ?Presented with shortness of breath about a year ago, she was found to have mild pulmonary fibrosis but also an echocardiogram had moderate left ventricular dysfunction and nonobstructive coronary disease.  She has been managed with guideline directed therapy including valsartan, carvedilol, Farxiga and spironolactone.  She continues with severe shortness of breath and borderline oxygen levels. ? ?No nocturnal dyspnea orthopnea.  2 episodes of syncope, both related to standing up quickly without sitting.  She does have orthostatic lightheadedness.  No significant palpitations, and occasional flutter   ? ?Narrow QRS  ?Mild ILD OSA ? ?DATE TEST EF   ?8/21 LHC  35-40 % Nonobstructive CAD  ?3/22 cMRI  41% LGE midmyocardial  ?3/23 Echo   30-35 %   ?     ? ?Date Cr K Hgb  ?3/22   14.0  ?2/23 1.15 4.3 15.2  ?      ? ? ? ? ?Past Medical History:  ?Diagnosis Date  ? Anemia   ? Asthma   ? "I dont think I have it.  ? CHF (congestive heart failure) ([place])   ? Depression   ? Emphysema of lung ([place])   ? GERD (gastroesophageal reflux disease)   ? Hemorrhoids   ? History of kidney stones   ? Cystoscopy  ? Hyperlipidemia   ? Hypertension   ? Hypothyroidism   ? Obesity   ? Pre-diabetes   ? Rosacea   ? Tubular adenoma of colon 03/26/2019  ? Colonoscopy repeat in 3 years  ?   ? ?Surgical History:  ?Past Surgical History:  ?Procedure Laterality Date  ? ABDOMINAL HYSTERECTOMY    ? ANTERIOR AND POSTERIOR VAGINAL  REPAIR  2020  ? CARDIAC CATHETERIZATION  11/01/2019  ? CATARACT EXTRACTION W/ INTRAOCULAR LENS IMPLANT Bilateral 2016  ? COLONOSCOPY    ? CYSTOSCOPY    ? DENTAL SURGERY  2016  ? Dental Implant  ? EYE SURGERY Bilateral   ? cataract  ? FRACTURE SURGERY Left 2013  ? Left ankle  ? INTERCOSTAL NERVE BLOCK Right 12/13/2019  ? Procedure: INTERCOSTAL NERVE BLOCK;  Surgeon: Melrose Nakayama, MD;  Location: [place];  Service: Thoracic;  Laterality: Right;  ? LUNG BIOPSY Right 12/13/2019  ? Procedure: LUNG BIOPSY;  Surgeon: Melrose Nakayama, MD;  Location: [place];  Service: Thoracic;  Laterality: Right;  ? LYMPH NODE BIOPSY Right 12/13/2019  ? Procedure: LYMPH NODE BIOPSY;  Surgeon: Melrose Nakayama, MD;  Location: [place];  Service: Thoracic;  Laterality: Right;  ? medtronix nerve stimulator  07/18/2019  ? Complete InterStim Sacral Neuromudulation system implantation 07/18/19, [facility]  ? NASAL SINUS SURGERY    ? RIGHT/LEFT HEART CATH AND CORONARY ANGIOGRAPHY N/A 11/01/2019  ? Procedure: RIGHT/LEFT HEART CATH AND CORONARY ANGIOGRAPHY;  Surgeon: Martinique, Peter M, MD;  Location: [place] [facility];  Service: Cardiovascular;  Laterality: N/A;  ? VAGINECTOMY, PARTIAL    ? VIDEO BRONCHOSCOPY N/A 12/13/2019  ? Procedure: VIDEO  BRONCHOSCOPY;  Surgeon: Melrose Nakayama, MD;  Location: [place];  Service: Thoracic;  Laterality: N/A;  ?  ? ?Home Meds: ?Current Meds  ?Medication Sig  ? amLODipine (NORVASC) 5 MG tablet TAKE ONE TABLET BY MOUTH DAILY  ? carvedilol (COREG) 25 MG tablet Take 1 tablet (25 mg total) by mouth 2 (two) times daily.  ? cetirizine (ZYRTEC) 10 MG tablet Take 10 mg by mouth daily.  ? FARXIGA 10 MG TABS tablet Take 1 tablet (10 mg total) by mouth daily.  ? fluticasone (FLONASE) 50 MCG/ACT nasal spray Place 2 sprays into both nostrils at bedtime as needed (allergies.).   ? levothyroxine (SYNTHROID) 125 MCG tablet TAKE ONE TABLET BY MOUTH DAILY  ? montelukast (SINGULAIR) 10 MG tablet TAKE ONE TABLET BY MOUTH EVERY  NIGHT AT BEDTIME  ? Multiple Vitamin (MULTIVITAMIN WITH MINERALS) TABS tablet Take 1 tablet by mouth daily.  ? omeprazole (PRILOSEC) 20 MG capsule Take 1 capsule (20 mg total) by mouth daily.  ? ondansetron (ZOFRAN-ODT) 4 MG disintegrating tablet DISSOLVE ONE TABLET BY MOUTH EVERY 8 HOURS AS NEEDED FOR NAUSEA/ VOMITING  ? Pirfenidone (ESBRIET) 801 MG TABS Take 801 mg by mouth with breakfast, with lunch, and with evening meal.  ? rosuvastatin (CRESTOR) 10 MG tablet TAKE ONE TABLET BY MOUTH DAILY  ? spironolactone (ALDACTONE) 25 MG tablet Take 25 mg by mouth daily.  ? valsartan (DIOVAN) 320 MG tablet Take 1 tablet (320 mg total) by mouth daily.  ? venlafaxine XR (EFFEXOR-XR) 150 MG 24 hr capsule Take 1 capsule (150 mg total) by mouth daily.  ? ? ?Allergies:  ?Allergies  ?Allergen Reactions  ? Ace Inhibitors Cough  ? Clarithromycin   ?  REACTION: GI sx  ? Codeine Nausea Only  ? Sulfamethoxazole Nausea Only  ? Telmisartan Other (See Comments)  ?  sleepy  ? ? ?Social History  ? ?Socioeconomic History  ? Marital status: Married  ?  Spouse name: Not on file  ? Number of children: Not on file  ? Years of education: Not on file  ? Highest education level: Not on file  ?Occupational History  ? Not on file  ?Tobacco Use  ? Smoking status: Former  ?  Packs/day: 0.50  ?  Years: 15.00  ?  Pack years: 7.50  ?  Types: Cigarettes  ?  Quit date: 08/18/2013  ?  Years since quitting: 7.8  ? Smokeless tobacco: Never  ?Vaping Use  ? Vaping Use: Never used  ?Substance and Sexual Activity  ? Alcohol use: Not Currently  ?  Alcohol/week: 1.0 standard drink  ?  Types: 1 Glasses of wine per week  ?  Comment: 2-3 glasses of wine weekly  ? Drug use: No  ? Sexual activity: Yes  ?Other Topics Concern  ? Not on file  ?Social History Narrative  ? Not on file  ? ?Social Determinants of Health  ? ?Financial Resource Strain: Not on file  ?Food Insecurity: Not on file  ?Transportation Needs: Not on file  ?Physical Activity: Not on file  ?Stress: Not on  file  ?Social Connections: Not on file  ?Intimate Partner Violence: Not on file  ?  ? ?Family History  ?Problem Relation Age of Onset  ? Heart disease Mother   ? Hypertension Mother   ? Osteoporosis Mother   ? Stroke Mother   ? Alcohol abuse Father   ? Diabetes Maternal Grandmother   ? Drug abuse Son   ? Breast cancer Neg Hx   ?  ? ?  ROS:  Please see the history of present illness.     All other systems reviewed and negative.  ? ? ?Physical Exam:  ?Blood pressure 130/76, pulse 74, height 5' 8" (1.727 m), weight 199 lb 9.6 oz (90.5 kg), SpO2 92 %. ?General: Well developed, well nourished female in no acute distress. ?Head: Normocephalic, atraumatic, sclera non-icteric, no xanthomas, nares are without discharge. ?EENT: normal  ?Lymph Nodes:  none ?Neck: Negative for carotid bruits. [id number] +HJR ?Back:without scoliosis kyphosis  ?Lungs: Crackles bilaterally to auscultation without wheezes, rales, or rhonchi. Breathing is unlabored. ?Heart: RRR with S1 S2. No murmur . No rubs, or gallops appreciated. ?Abdomen: Soft, non-tender, non-distended with normoactive bowel sounds. No hepatomegaly. No rebound/guarding. No obvious abdominal masses. ?Msk:  Strength and tone appear normal for age. ?Extremities: No clubbing or cyanosis. No edema.  Distal pedal pulses are 2+ and equal bilaterally. ?Skin: Warm and Dry ?Neuro: Alert and oriented X 3. CN III-XII intact Grossly normal sensory and motor function . ?Psych:  Responds to questions appropriately with a normal affect. ?  ?  ?  ? ?EKG: Sinus at 74 ?Interval 16/09/37 ? ? ?Assessment and Plan:  ?NICM ? ?CHF chronic systolic ? ?Polycythemia  ? ?ILD ? ?The patient has a nonischemic cardiomyopathy that is persistent despite guideline directed medical therapy.  She also has significant comorbidities with her interstitial lung disease. ? ?The role of an implantable defibrillator in an older nonischemic person is not so clear.  The [place] trial originally identified and age related  benefit, recently published 9-year follow-up confirmed this with no discernible benefit identified in the age over 74.  This lady is 16 and with her lung disease, I think the likelihood of benefit is statisticall

## 2021-06-26 ENCOUNTER — Encounter: Payer: Self-pay | Admitting: Internal Medicine

## 2021-06-26 DIAGNOSIS — I5020 Unspecified systolic (congestive) heart failure: Secondary | ICD-10-CM

## 2021-06-30 ENCOUNTER — Other Ambulatory Visit: Payer: Medicare Other

## 2021-06-30 DIAGNOSIS — I5020 Unspecified systolic (congestive) heart failure: Secondary | ICD-10-CM

## 2021-07-01 LAB — PRO B NATRIURETIC PEPTIDE: NT-Pro BNP: 216 pg/mL (ref 0–738)

## 2021-07-08 ENCOUNTER — Encounter: Payer: Self-pay | Admitting: Student

## 2021-07-08 NOTE — Progress Notes (Signed)
Susan Hunt's heart failure has failed to improve despite titration of guideline directed medication such that he qualifies for the [facility] NEO device. The following information from the patient?s medical record supports the medical necessity of this procedure for my patient, consistent with the FDA on-label indication for BAROSTIM NEO: ? ?? LVEF of 30-35%  confirmed by Echo on 05/26/2021  ? ?? NT-proBNP of <1600 pg/ml  = Pt had a NT-proBNP of 216 on 06/30/2021 ? ??Symptomatic despite medication management of: beta blocker, ACEi/ARB/ARNi, Aldosterone inhibitor, and SGLT2 inhibitor as evidenced by symptoms below.  ? ?? This patients signs and symptoms of heart failure include "dyspnea with mild to moderate exertion, edema, fatigue, weakness, and dizziness  ? ?? NYHA Congestive Heart Failure Classification: III ? ?? Recent hospitalization for Heart Failure on (not applicable for this patient)  ? ?This patient is NOT indicated for cardiac resynchronization therapy because QRS = 88 by EKG on 06/25/2021 ? ?She may be referred directly to Dr. Trula Slade for consideration of implantation or may be considered for [place].  ? ?[name], PA-C  ?07/08/2021 10:24 AM   ? ?

## 2021-07-13 NOTE — Progress Notes (Signed)
? ?PCP:  Leamon Arnt, MD ?Primary Cardiologist: Elouise Munroe, MD ?Electrophysiologist: Dr. Caryl Comes ? ?Susan Hunt is a 76 y.o. female seen today for Dr. Caryl Comes for Barostim consideration.  Since last being seen in our clinic the patient reports doing OK.  She reports SOB for about 2 years and has since started treatment for pulmonary fibrosis.  She can do her ADLs without much difficulty. She is winded walking to the mail box or further. Her O2 levels are borderline at times but she has not required continues O2.  She has had prior syncope in setting of standing up quickly.  ? ?Narrow QRS  ?Mild ILD OSA ? ?DATE TEST EF   ?8/21 LHC  35-40 % Nonobstructive CAD  ?3/22 cMRI  41% LGE midmyocardial  ?3/23 Echo   30-35 %   ?     ? ?Date Cr K Hgb  ?3/22   14.0  ?2/23 1.15 4.3 15.2  ?      ? ? ?Past Medical History:  ?Diagnosis Date  ? Anemia   ? Asthma   ? "I dont think I have it.  ? CHF (congestive heart failure) ([place])   ? Depression   ? Emphysema of lung ([place])   ? GERD (gastroesophageal reflux disease)   ? Hemorrhoids   ? History of kidney stones   ? Cystoscopy  ? Hyperlipidemia   ? Hypertension   ? Hypothyroidism   ? Obesity   ? Pre-diabetes   ? Rosacea   ? Tubular adenoma of colon 03/26/2019  ? Colonoscopy repeat in 3 years  ? ?Past Surgical History:  ?Procedure Laterality Date  ? ABDOMINAL HYSTERECTOMY    ? ANTERIOR AND POSTERIOR VAGINAL REPAIR  2020  ? CARDIAC CATHETERIZATION  11/01/2019  ? CATARACT EXTRACTION W/ INTRAOCULAR LENS IMPLANT Bilateral 2016  ? COLONOSCOPY    ? CYSTOSCOPY    ? DENTAL SURGERY  2016  ? Dental Implant  ? EYE SURGERY Bilateral   ? cataract  ? FRACTURE SURGERY Left 2013  ? Left ankle  ? INTERCOSTAL NERVE BLOCK Right 12/13/2019  ? Procedure: INTERCOSTAL NERVE BLOCK;  Surgeon: Melrose Nakayama, MD;  Location: [place];  Service: Thoracic;  Laterality: Right;  ? LUNG BIOPSY Right 12/13/2019  ? Procedure: LUNG BIOPSY;  Surgeon: Melrose Nakayama, MD;  Location: [place];  Service:  Thoracic;  Laterality: Right;  ? LYMPH NODE BIOPSY Right 12/13/2019  ? Procedure: LYMPH NODE BIOPSY;  Surgeon: Melrose Nakayama, MD;  Location: [place];  Service: Thoracic;  Laterality: Right;  ? medtronix nerve stimulator  07/18/2019  ? Complete InterStim Sacral Neuromudulation system implantation 07/18/19, [facility]  ? NASAL SINUS SURGERY    ? RIGHT/LEFT HEART CATH AND CORONARY ANGIOGRAPHY N/A 11/01/2019  ? Procedure: RIGHT/LEFT HEART CATH AND CORONARY ANGIOGRAPHY;  Surgeon: Martinique, Peter M, MD;  Location: [place] [facility];  Service: Cardiovascular;  Laterality: N/A;  ? VAGINECTOMY, PARTIAL    ? VIDEO BRONCHOSCOPY N/A 12/13/2019  ? Procedure: VIDEO BRONCHOSCOPY;  Surgeon: Melrose Nakayama, MD;  Location: [place];  Service: Thoracic;  Laterality: N/A;  ? ? ?Current Outpatient Medications  ?Medication Sig Dispense Refill  ? amLODipine (NORVASC) 5 MG tablet TAKE ONE TABLET BY MOUTH DAILY 90 tablet 3  ? carvedilol (COREG) 25 MG tablet Take 1 tablet (25 mg total) by mouth 2 (two) times daily. 180 tablet 3  ? cetirizine (ZYRTEC) 10 MG tablet Take 10 mg by mouth daily.    ? FARXIGA 10  MG TABS tablet Take 1 tablet (10 mg total) by mouth daily. 30 tablet 4  ? fluticasone (FLONASE) 50 MCG/ACT nasal spray Place 2 sprays into both nostrils at bedtime as needed (allergies.).     ? levothyroxine (SYNTHROID) 125 MCG tablet TAKE ONE TABLET BY MOUTH DAILY 90 tablet 0  ? montelukast (SINGULAIR) 10 MG tablet TAKE ONE TABLET BY MOUTH EVERY NIGHT AT BEDTIME 90 tablet 1  ? Multiple Vitamin (MULTIVITAMIN WITH MINERALS) TABS tablet Take 1 tablet by mouth daily.    ? omeprazole (PRILOSEC) 20 MG capsule Take 1 capsule (20 mg total) by mouth daily. 90 capsule 3  ? ondansetron (ZOFRAN-ODT) 4 MG disintegrating tablet DISSOLVE ONE TABLET BY MOUTH EVERY 8 HOURS AS NEEDED FOR NAUSEA/ VOMITING 20 tablet 0  ? Pirfenidone (ESBRIET) 801 MG TABS Take 801 mg by mouth with breakfast, with lunch, and with evening meal. 90 tablet 11  ? rosuvastatin  (CRESTOR) 10 MG tablet TAKE ONE TABLET BY MOUTH DAILY 90 tablet 3  ? spironolactone (ALDACTONE) 25 MG tablet Take 25 mg by mouth daily.    ? valsartan (DIOVAN) 320 MG tablet Take 1 tablet (320 mg total) by mouth daily. 90 tablet 3  ? venlafaxine XR (EFFEXOR-XR) 150 MG 24 hr capsule Take 1 capsule (150 mg total) by mouth daily. 90 capsule 3  ? ?No current facility-administered medications for this visit.  ? ? ?Allergies  ?Allergen Reactions  ? Ace Inhibitors Cough  ? Clarithromycin   ?  REACTION: GI sx  ? Codeine Nausea Only  ? Sulfamethoxazole Nausea Only  ? Telmisartan Other (See Comments)  ?  sleepy  ? ? ?Social History  ? ?Socioeconomic History  ? Marital status: Married  ?  Spouse name: Not on file  ? Number of children: Not on file  ? Years of education: Not on file  ? Highest education level: Not on file  ?Occupational History  ? Not on file  ?Tobacco Use  ? Smoking status: Former  ?  Packs/day: 0.50  ?  Years: 15.00  ?  Pack years: 7.50  ?  Types: Cigarettes  ?  Quit date: 08/18/2013  ?  Years since quitting: 7.9  ? Smokeless tobacco: Never  ?Vaping Use  ? Vaping Use: Never used  ?Substance and Sexual Activity  ? Alcohol use: Not Currently  ?  Alcohol/week: 1.0 standard drink  ?  Types: 1 Glasses of wine per week  ?  Comment: 2-3 glasses of wine weekly  ? Drug use: No  ? Sexual activity: Yes  ?Other Topics Concern  ? Not on file  ?Social History Narrative  ? Not on file  ? ?Social Determinants of Health  ? ?Financial Resource Strain: Not on file  ?Food Insecurity: Not on file  ?Transportation Needs: Not on file  ?Physical Activity: Not on file  ?Stress: Not on file  ?Social Connections: Not on file  ?Intimate Partner Violence: Not on file  ? ? ? ?Review of Systems: ?Review of systems complete and found to be negative unless listed in HPI.   ? ?Physical Exam: ?There were no vitals filed for this visit. ? ?General:  Well appearing. No resp difficulty. ?HEENT: Normal ?Neck: Supple. JVP 5-6. Carotids 2+ bilat; no  bruits. No thyromegaly or nodule noted. ?Cor: PMI nondisplaced. RRR, No M/G/R noted ?Lungs: CTAB, normal effort. ?Abdomen: Soft, non-tender, non-distended, no HSM. No bruits or masses. +BS   ?Extremities: No cyanosis, clubbing, or rash. R and LLE no edema.  ?Neuro: Alert &  orientedx3, cranial nerves grossly intact. moves all 4 extremities w/o difficulty. Affect pleasant  ? ?EKG is not ordered.  ? ?Additional studies reviewed include: ?Previous EP office notes.  ? ?Assessment and Plan: ? ?1. NICM ?2. Chronic systolic CHF ?Dr. Caryl Comes discussed with her at length and we do not recommend ICD for primary prevention given her age.  ? ?Susan Hunt's heart failure has failed to improve despite titration of guideline directed medication such that he qualifies for the [facility] NEO device. The following information from the patient?s medical record supports the medical necessity of this procedure for my patient, consistent with the FDA on-label indication for BAROSTIM NEO: ?  ?? LVEF of 30-35%  confirmed by Echo on 05/26/2021  ?  ?? NT-proBNP of <1600 pg/ml  = Pt had a NT-proBNP of 216 on 06/30/2021 ?  ??Symptomatic despite medication management of: beta blocker, ACEi/ARB/ARNi, Aldosterone inhibitor, and SGLT2 inhibitor as evidenced by symptoms below.  ?  ?? This patients signs and symptoms of heart failure include "dyspnea with mild to moderate exertion, edema, fatigue, weakness, and dizziness  ?  ?? NYHA Congestive Heart Failure Classification: III ?  ?? Recent hospitalization for Heart Failure on (not applicable for this patient)  ?  ?This patient is NOT indicated for cardiac resynchronization therapy because QRS = 88 by EKG on 06/25/2021 ?  ?She has been referred directly to Dr. Trula Slade for consideration of implantation or may be considered for [place].  ? ?[name], PA-C  ?07/13/2021 9:16 AM  ? ?

## 2021-07-14 ENCOUNTER — Other Ambulatory Visit: Payer: Self-pay | Admitting: Family Medicine

## 2021-07-17 ENCOUNTER — Other Ambulatory Visit: Payer: Self-pay | Admitting: Family Medicine

## 2021-07-19 ENCOUNTER — Other Ambulatory Visit: Payer: Self-pay

## 2021-07-19 MED ORDER — SPIRONOLACTONE 25 MG PO TABS: 25.0000 mg | ORAL_TABLET | Freq: Every day | ORAL | 1 refills | Status: DC

## 2021-07-20 ENCOUNTER — Encounter: Payer: Self-pay | Admitting: Student

## 2021-07-20 ENCOUNTER — Ambulatory Visit: Payer: Medicare Other | Admitting: Student

## 2021-07-20 VITALS — BP 125/64 | HR 80 | Ht 67.0 in | Wt 198.8 lb

## 2021-07-20 DIAGNOSIS — I5020 Unspecified systolic (congestive) heart failure: Secondary | ICD-10-CM | POA: Diagnosis not present

## 2021-07-20 DIAGNOSIS — G4733 Obstructive sleep apnea (adult) (pediatric): Secondary | ICD-10-CM

## 2021-07-20 DIAGNOSIS — I10 Essential (primary) hypertension: Secondary | ICD-10-CM

## 2021-07-22 ENCOUNTER — Encounter: Payer: Self-pay | Admitting: Internal Medicine

## 2021-07-22 ENCOUNTER — Ambulatory Visit (INDEPENDENT_AMBULATORY_CARE_PROVIDER_SITE_OTHER): Payer: Medicare Other | Admitting: Internal Medicine

## 2021-07-22 ENCOUNTER — Ambulatory Visit: Payer: Medicare Other | Admitting: Internal Medicine

## 2021-07-22 VITALS — BP 122/80 | HR 87 | Ht 67.0 in | Wt 199.0 lb

## 2021-07-22 DIAGNOSIS — Z5181 Encounter for therapeutic drug level monitoring: Secondary | ICD-10-CM | POA: Diagnosis not present

## 2021-07-22 DIAGNOSIS — Z85828 Personal history of other malignant neoplasm of skin: Secondary | ICD-10-CM | POA: Diagnosis not present

## 2021-07-22 DIAGNOSIS — L700 Acne vulgaris: Secondary | ICD-10-CM | POA: Diagnosis not present

## 2021-07-22 DIAGNOSIS — L988 Other specified disorders of the skin and subcutaneous tissue: Secondary | ICD-10-CM | POA: Diagnosis not present

## 2021-07-22 DIAGNOSIS — Z08 Encounter for follow-up examination after completed treatment for malignant neoplasm: Secondary | ICD-10-CM | POA: Diagnosis not present

## 2021-07-22 DIAGNOSIS — J84112 Idiopathic pulmonary fibrosis: Secondary | ICD-10-CM | POA: Diagnosis not present

## 2021-07-22 DIAGNOSIS — R0609 Other forms of dyspnea: Secondary | ICD-10-CM

## 2021-07-22 DIAGNOSIS — L080 Pyoderma: Secondary | ICD-10-CM | POA: Diagnosis not present

## 2021-07-22 LAB — PULMONARY FUNCTION TEST
DL/VA % pred: 97 %
DL/VA: 3.89 ml/min/mmHg/L
DLCO cor % pred: 58 %
DLCO cor: 12.27 ml/min/mmHg
DLCO unc % pred: 58 %
DLCO unc: 12.27 ml/min/mmHg
FEF 25-75 Pre: 2.34 L/sec
FEF2575-%Pred-Pre: 131 %
FEV1-%Pred-Pre: 74 %
FEV1-Pre: 1.77 L
FEV1FVC-%Pred-Pre: 115 %
FEV6-%Pred-Pre: 68 %
FEV6-Pre: 2.05 L
FEV6FVC-%Pred-Pre: 104 %
FVC-%Pred-Pre: 65 %
FVC-Pre: 2.05 L
Pre FEV1/FVC ratio: 86 %
Pre FEV6/FVC Ratio: 100 %

## 2021-07-22 NOTE — Patient Instructions (Addendum)
ICD-10-CM   ?1. IPF (idiopathic pulmonary fibrosis) ([place])  J84.112   ?  ?2. Encounter for therapeutic drug monitoring  Z51.81   ?  ?3. Dyspnea on exertion  R06.09   ?  ? ? ? ?IPF 18% worse in 2 years but stable Feb 2023 -> May 2023 ?Failed criteria to enter [place] study ? ?Plan ? - check cbc, bmet, lft, bnp ?- continue pirfenidone ? - apply sunscreen when out ?- appreciate you enrolling in [place] study later this month ? ?Followup ? - 3-4 months - 30 min visit with Chase Caller - can cancel if in a randomized study ?

## 2021-07-22 NOTE — Progress Notes (Signed)
? ? ? ? ?OV 08/19/2019 ? ?Subjective:  ?Patient ID: Susan Hunt, female , DOB: [birth year] , age 76 y.o. , MRN: [id number] , ADDRESS: [address] ?[place] [address] ? ? ?08/19/2019 -   ?Chief Complaint  ?Patient presents with  ? Consult  ?  COPD, emphysema.  sob with exertion.  dry coughs alot at night  ? ? ? ?HPI ?Susan Hunt 76 y.o. -she is a retired Marine scientist.  She used to work at the [employer] across [place] long and having retired from that 10 years ago and then she worked for another agency apparently having retired from all duties few years ago.  Husband is a retired Software engineer.  They are here for shortness of breath.  She tells me that she has had insidious onset of shortness of breath and cough that started 1 year ago and has been progressive.  In the same time she is also been on nitrofurantoin for times.  First 3 of these were 1 week each.  The last 1 being for at least 6 weeks ending approximately 4-6 weeks ago.  Because of progressive symptoms she was given Stiolto but this did not help.  She has been on longstanding Singulair for several years independent of these problems.  This because of allergies.  Therefore she has been referred here.  She did have a chest x-ray that on my personal visualization shows ILD changes.  It is documented below.  She did have a CT abdomen lung images that was reported as normal but I am not so sure from a year ago.  She is a previous smoker.  She is also self-referred herself to [facility] on the basis that she think she might have advanced COPD and is looking for a Zephyr valve.  Symptoms started in August 2020. ? ? ?[place] Integrated Comprehensive ILD Questionnaire ? ?Symptoms:  ?She does have a cough.  She says the cough started in August 2020 the same for shortness of breath started.  She does cough at night.  She does cough when she lies down and it gets worse.  She does feel a tickle in her throat.  Cough does not affect her voice she  does not bring any phlegm.  There is no wheezing.  There is no nausea no vomiting no diarrhea. ? ? ? ?Past Medical History :   She does have a histo abdomen ry of asthma for the last few years.  Also COPD not otherwise specified for the last few years.  His acid reflux disease for the last few years.  Diabetes for the last few weeks and thyroid disease for the last few years.  Otherwise denies any collagen vascular disease or vasculitis.  Denies any sleep apnea.  Denies pulmonary hypertension.  Denies stroke denies pneumonias recurrent pneumonias.  Denies heart disease or pleurisy. ? ? ?ROS: Positive for fatigue for the last several months.  Arthralgia for the last several months.  Dry eyes for the last several months.  With intermittent nausea for the last few years.  T there is daytime sleepiness for the last several months.   ? ? ?FAMILY HISTORY of LUNG DISEASE:  -Denies any pulmonary fibrosis.  Denies COPD denies asthma in the family.  Denies sarcoid denies cystic fibrosis denies hypersensitive pneumonitis.  Denies any autoimmune disease. ? ? ?EXPOSURE HISTORY:   -She denies any Covid history.  Denies any exposure to Covid.  She did do Covid vaccine.  No reactions after  the Covid vaccine.  Did smoke cigarettes 15-year 2000 and 2015 10 to 12 cigarettes a day.  Did not do any passive smoking.  Never did any marijuana.  Elevated electronic cigarettes.  Never use cocaine never used intravenous drug use. ? ? ?HOME and HOBBY DETAILS :  -[place]  Is lived there for 21 years.  Age of the home is 34 years.  In 2004 there was mold/mildew in the shower curtain.  And then there was mold under the shower.  This was torn down.  This was in 2004 but currently none.  She did some mulch back at work 2 months ago.  No humidifier use no CPAP use.  No nebulizer use.  No steam iron use.  No Jacuzzi use.  No misting Fountain in the house.  No pet birds or parakeets no gerbils.  No feather pillows.  No  mold in the [facility] duct.  No music habits. ? ? 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  No otehrr organic antigen exposure that she recollects (MR side note: a different patient in 2016 reported that in 2013-2016 time lot of renovations there and mold +).  Patient reported on September 10, 2019: That between 1966 and 1980 she worked at [employer] and [place].  At that time there is lot of mold exposure in the building.  The building was cleaned out.  Also checked for inorganic antigen exposure and this is negative.  Never worked in a dusty environment.  Never exposed to fumes. ? ? ?PULMONARY TOXICITY HISTORY (27 items): Use nitrofurantoin in 2019 and 2020.  This is described below.  Question if you can for this there is ? ? ? ? ? ?Simple office walk 185 feet x  3 laps goal with forehead probe 08/19/2019 ?  ?O2 used ra  ?Number laps completed Attempted 3 but desaturated at 2  ?Comments about pace Normal pace  ?Resting Pulse Ox/HR 100% and 101/min  ?Final Pulse Ox/HR 86% and 116/min  ?Desaturated </= 88% yes  ?Desaturated <= 3% points Yes , 14  ?Got Tachycardic >/= 90/min yes  ?Symptoms at end of test dyspneic  ?Miscellaneous comments Corrected with  2L [place]  ? ? ?Nuclear medicine cardiac stress test March 2016: Overall Impression:  Normal stress nuclear study with a small, mild, fixed apical defect consistent with thinning; no ischemia. LV Wall Motion:  NL LV Function; NL Wall Motion ?  ? ?CT abdomen lung cuts September 2020: Reported as normal.  I personally visualized and retrospect looks hazy and whether this is atelectasis or some other findings I do not know. ? ?Jul 19, 2019:: Personally visualized shows findings of ILD.  I am not able to see a prior chest x-ray in the system or CT scan of the chest ? ? ? ?Results for Susan Hunt, Susan "PAM" (MRN [id number]) as of 08/19/2019 15:18 ? Ref. Range  03/26/2019 13:45  ?Creatinine Latest Ref Range: 0.40 - 1.20 mg/dL 1.05  ? ?ROS ?- per HPI ?Results for Susan Hunt, Susan "PAM" (MRN [id number]) as of 08/19/2019 15:18 ? Ref. Range 03/26/2019 13:45  ?Hemoglobin Latest Ref Range: 12.0 - 15.0 g/dL 14.0  ? ? ? ?OV 09/10/2019 ? ?Subjective:  ?Patient ID: Susan Hunt, female , DOB: 1945-04-03 , age 66 y.o. , MRN: [id number] , ADDRESS: [address] ?[place] [address] ? ? ?  09/10/2019 -   ?Chief Complaint  ?Patient presents with  ? Follow-up  ?  shortness of breath with exertion and non-productive cough  ? ? ? ?HPI ?Susan Hunt 76 y.o. -presents for follow-up to discuss results of dyspnea work-up.  High-resolution CT chest was personally visualized.  It shows presence of interstitial lung disease.  It needs Fleischner criteria for diagnosis not consistent with UIP.  Per ATS this is alternative diagnosis to UIP.  Radiologist raised the concern of hypersensitive pneumonitis but there is no air-trapping.  The other possibilities NSIP.  She is no longer on nitrofurantoin.  In any event unit she only took it intermittently.  Overall she is stable since last visit.  She had autoimmune profile.  Is only trace positive for ANA.  Her IgE is slightly elevated.  Her PFT shows mild restriction with reduction in diffusion capacity.  We went over her exposure history again.  This time she did recollect being exposed to mold for 15 to 20 years up until 1980 while living in [place] and working at [employer].  She does not recollect mold at behavioral [facility] where she worked recently until she retired.  However another patient of mine did recollect mold in this building at [employer]. ? ? ?Low risk cardiac stress test in 2016 but with ejection fraction 54%.-Since then no overall change in health.  Husband inquired about the low ejection fraction. ? ?In terms of symptoms: She is try to get portable oxygen system.  She is switching company to [employer] because of the  shortage of portable oxygen systems at [facility]. ? ? ? ? ?OV 11/12/2019 ? ? ?Subjective:  ?Patient ID: Susan Hunt, female , DOB: [birth year], age 19 y.o. years. , MRN: [id number],  ADDRESS: [address]

## 2021-07-22 NOTE — Progress Notes (Signed)
Spiro/Dlco done today. 

## 2021-07-23 LAB — BASIC METABOLIC PANEL
BUN: 18 mg/dL (ref 6–23)
CO2: 27 mEq/L (ref 19–32)
Calcium: 9.6 mg/dL (ref 8.4–10.5)
Chloride: 104 mEq/L (ref 96–112)
Creatinine, Ser: 1.06 mg/dL (ref 0.40–1.20)
GFR: 50.94 mL/min — ABNORMAL LOW (ref >60.00)
Glucose, Bld: 102 mg/dL — ABNORMAL HIGH (ref 70–99)
Potassium: 4.1 mEq/L (ref 3.5–5.1)
Sodium: 141 mEq/L (ref 135–145)

## 2021-07-23 LAB — CBC WITH DIFFERENTIAL/PLATELET
Basophils Absolute: 0.1 10*3/uL (ref 0.0–0.1)
Basophils Relative: 0.8 % (ref 0.0–3.0)
Eosinophils Absolute: 0.5 10*3/uL (ref 0.0–0.7)
Eosinophils Relative: 4.9 % (ref 0.0–5.0)
HCT: 45.4 % (ref 36.0–46.0)
Hemoglobin: 15.2 g/dL — ABNORMAL HIGH (ref 12.0–15.0)
Lymphocytes Relative: 26.3 % (ref 12.0–46.0)
Lymphs Abs: 2.7 10*3/uL (ref 0.7–4.0)
MCHC: 33.4 g/dL (ref 30.0–36.0)
MCV: 95.6 fl (ref 78.0–100.0)
Monocytes Absolute: 0.9 10*3/uL (ref 0.1–1.0)
Monocytes Relative: 8.8 % (ref 3.0–12.0)
Neutro Abs: 6.1 10*3/uL (ref 1.4–7.7)
Neutrophils Relative %: 59.2 % (ref 43.0–77.0)
Platelets: 239 10*3/uL (ref 150.0–400.0)
RBC: 4.75 Mil/uL (ref 3.87–5.11)
RDW: 12.8 % (ref 11.5–15.5)
WBC: 10.3 10*3/uL (ref 4.0–10.5)

## 2021-07-23 LAB — HEPATIC FUNCTION PANEL
ALT: 15 U/L (ref 0–35)
AST: 22 U/L (ref 0–37)
Albumin: 4.5 g/dL (ref 3.5–5.2)
Alkaline Phosphatase: 86 U/L (ref 39–117)
Bilirubin, Direct: 0.1 mg/dL (ref 0.0–0.3)
Total Bilirubin: 0.4 mg/dL (ref 0.2–1.2)
Total Protein: 7.6 g/dL (ref 6.0–8.3)

## 2021-07-23 LAB — BRAIN NATRIURETIC PEPTIDE: Pro B Natriuretic peptide (BNP): 51 pg/mL (ref 0.0–100.0)

## 2021-07-25 ENCOUNTER — Encounter: Payer: Self-pay | Admitting: Internal Medicine

## 2021-07-26 ENCOUNTER — Encounter: Payer: Self-pay | Admitting: Internal Medicine

## 2021-07-26 ENCOUNTER — Telehealth: Payer: Self-pay

## 2021-07-26 MED ORDER — PREDNISONE 10 MG PO TABS: 10.0000 mg | ORAL_TABLET | Freq: Every day | ORAL | 0 refills | Status: DC

## 2021-07-26 MED ORDER — AZITHROMYCIN 250 MG PO TABS: ORAL_TABLET | ORAL | 0 refills | Status: DC

## 2021-07-26 NOTE — Telephone Encounter (Signed)
Given fact she has underlying IPF we need to be proacative ? ? ?Plan ? - Z PAK ?- Take prednisone 40 mg daily x 2 days, then 69m daily x 2 days, then 191mdaily x 2 days, then 47m26maily x 2 days and stop ?- monitor pulse ox closely ? ? ? ?Allergies  ?Allergen Reactions  ? Ace Inhibitors Cough  ? Clarithromycin   ?  REACTION: GI sx  ? Codeine Nausea Only  ? Sulfamethoxazole Nausea Only  ? Telmisartan Other (See Comments)  ?  sleepy  ? ? ?

## 2021-07-26 NOTE — Telephone Encounter (Signed)
See telephone encounter from 07/26/21 ?

## 2021-07-26 NOTE — Telephone Encounter (Signed)
See telephone encounter from 07/26/21. Closing this encounter  ? ?

## 2021-07-26 NOTE — Telephone Encounter (Signed)
Spoke with pt who state non productive cough and increased SOB statered last Thursday. Pt denies fever/ chills/ GI upset. Pt stating all medications as directed and not having used any OTCs thus far for cough. Pt states O2 saturations staying between 90-94%. Dr. Chase Caller please advise.  ?

## 2021-07-26 NOTE — Telephone Encounter (Signed)
Called and spoke with patient. She verbalized understanding. Patient verified her pharmacy. Medications sent in to pharmacy. ? ?Nothing further needed.  ?

## 2021-07-26 NOTE — Telephone Encounter (Signed)
See Telephone encounter from 07/26/21. Closing this encounter  ?

## 2021-08-02 ENCOUNTER — Other Ambulatory Visit (HOSPITAL_COMMUNITY): Payer: Self-pay | Admitting: Surgery

## 2021-08-02 ENCOUNTER — Ambulatory Visit: Payer: Medicare Other | Admitting: Surgery

## 2021-08-02 ENCOUNTER — Ambulatory Visit (HOSPITAL_COMMUNITY)
Admission: RE | Admit: 2021-08-02 | Discharge: 2021-08-02 | Disposition: A | Discharge status: Home / Self Care | Payer: Medicare Other | Source: Ambulatory Visit | Attending: Surgery | Admitting: Surgery

## 2021-08-02 ENCOUNTER — Encounter: Payer: Self-pay | Admitting: Surgery

## 2021-08-02 VITALS — BP 125/77 | HR 65 | Temp 98.0°F | Resp 20 | Ht 67.0 in | Wt 198.0 lb

## 2021-08-02 DIAGNOSIS — I779 Disorder of arteries and arterioles, unspecified: Secondary | ICD-10-CM

## 2021-08-02 DIAGNOSIS — I5022 Chronic systolic (congestive) heart failure: Secondary | ICD-10-CM | POA: Diagnosis not present

## 2021-08-02 NOTE — Progress Notes (Signed)
[facility] [place]  Patient name: Susan Hunt MRN: [id number] DOB: November 24, 1945 Sex: female   REQUESTING PROVIDER:    Dr. Rayann Heman   REASON FOR CONSULT:    Barostim evaluation  HISTORY OF PRESENT ILLNESS:   Susan Hunt is a 76 y.o. female, who is referred for Barostim evaluation.  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  She suffers from NYHA congestive heart failure, class III.  Her most recent echo on 05/26/2021 shows an ejection fraction of 30 to 35%.  Her NT proBNP was 216 on 06/30/2021.  She remains symptomatic despite optimal medical management including ACE inhibitor, aldosterone inhibitor, SGLT2 inhibitor.  Her symptoms include mild to moderate exertional fatigue, edema, weakness, and dizziness.  PAST MEDICAL HISTORY    Past Medical History:  Diagnosis Date   Anemia    Asthma    "I dont think I have it.   CHF (congestive heart failure) (HCC)    Depression    Emphysema of lung (HCC)    GERD (gastroesophageal reflux disease)    Hemorrhoids    History of kidney stones    Cystoscopy   Hyperlipidemia    Hypertension    Hypothyroidism    Obesity    Pre-diabetes    Rosacea    Tubular adenoma of colon 03/26/2019   Colonoscopy repeat in 3 years     FAMILY HISTORY   Family History  Problem Relation Age of Onset   Heart disease Mother    Hypertension Mother    Osteoporosis Mother    Stroke Mother    Alcohol abuse Father    Diabetes Maternal Grandmother    Drug abuse Son    Breast cancer Neg Hx     SOCIAL HISTORY:   Social History   Socioeconomic History   Marital status: Married    Spouse name: Not on file   Number of children: Not on file   Years of education: Not on file   Highest education level: Not on file  Occupational History   Not on file  Tobacco Use   Smoking status: Former    Packs/day: 0.50    Years: 15.00    Pack years: 7.50    Types: Cigarettes     Quit date: 08/18/2013    Years since quitting: 7.9   Smokeless tobacco: Never  Vaping Use   Vaping Use: Never used  Substance and Sexual Activity   Alcohol use: Not Currently    Alcohol/week: 1.0 standard drink    Types: 1 Glasses of wine per week    Comment: 2-3 glasses of wine weekly   Drug use: No   Sexual activity: Yes  Other Topics Concern   Not on file  Social History Narrative   Not on file   Social Determinants of Health   Financial Resource Strain: Not on file  Food Insecurity: Not on file  Transportation Needs: Not on file  Physical Activity: Not on file  Stress: Not on file  Social Connections: Not on file  Intimate Partner Violence: Not on file    ALLERGIES:    Allergies  Allergen Reactions   Ace Inhibitors Cough   Clarithromycin     REACTION: GI sx   Codeine Nausea Only   Sulfamethoxazole Nausea Only   Telmisartan Other (See Comments)    sleepy    CURRENT MEDICATIONS:    Current Outpatient Medications  Medication Sig Dispense Refill   amLODipine (NORVASC) 5 MG tablet TAKE ONE  TABLET BY MOUTH DAILY 90 tablet 3   azithromycin (ZITHROMAX) 250 MG tablet Pauline Good 6 each 0   carvedilol (COREG) 25 MG tablet Take 1 tablet (25 mg total) by mouth 2 (two) times daily. 180 tablet 3   cetirizine (ZYRTEC) 10 MG tablet Take 10 mg by mouth daily.     FARXIGA 10 MG TABS tablet Take 1 tablet (10 mg total) by mouth daily. 30 tablet 4   fluticasone (FLONASE) 50 MCG/ACT nasal spray Place 2 sprays into both nostrils at bedtime as needed (allergies.).      levothyroxine (SYNTHROID) 125 MCG tablet TAKE ONE TABLET BY MOUTH DAILY 90 tablet 0   montelukast (SINGULAIR) 10 MG tablet TAKE ONE TABLET BY MOUTH EVERY NIGHT AT BEDTIME 90 tablet 1   Multiple Vitamin (MULTIVITAMIN WITH MINERALS) TABS tablet Take 1 tablet by mouth daily.     omeprazole (PRILOSEC) 20 MG capsule Take 1 capsule (20 mg total) by mouth daily. 90 capsule 3   ondansetron (ZOFRAN-ODT) 4 MG  disintegrating tablet DISSOLVE ONE TABLET BY MOUTH EVERY 8 HOURS AS NEEDED FOR NAUSEA/ VOMITING 20 tablet 0   Pirfenidone (ESBRIET) 801 MG TABS Take 801 mg by mouth with breakfast, with lunch, and with evening meal. 90 tablet 11   rosuvastatin (CRESTOR) 10 MG tablet TAKE ONE TABLET BY MOUTH DAILY 90 tablet 3   spironolactone (ALDACTONE) 25 MG tablet Take 1 tablet (25 mg total) by mouth daily. 60 tablet 1   valsartan (DIOVAN) 320 MG tablet Take 1 tablet (320 mg total) by mouth daily. 90 tablet 3   venlafaxine XR (EFFEXOR-XR) 150 MG 24 hr capsule Take 1 capsule (150 mg total) by mouth daily. 90 capsule 3   No current facility-administered medications for this visit.    REVIEW OF SYSTEMS:   _0  denotes positive finding, _1  denotes negative finding Cardiac  Comments:  Chest pain or chest pressure:    Shortness of breath upon exertion:    Short of breath when lying flat:    Irregular heart rhythm:        Vascular    Pain in calf, thigh, or hip brought on by ambulation:    Pain in feet at night that wakes you up from your sleep:     Blood clot in your veins:    Leg swelling:         Pulmonary    Oxygen at home:    Productive cough:     Wheezing:         Neurologic    Sudden weakness in arms or legs:     Sudden numbness in arms or legs:     Sudden onset of difficulty speaking or slurred speech:    Temporary loss of vision in one eye:     Problems with dizziness:         Gastrointestinal    Blood in stool:      Vomited blood:         Genitourinary    Burning when urinating:     Blood in urine:        Psychiatric    Major depression:         Hematologic    Bleeding problems:    Problems with blood clotting too easily:        Skin    Rashes or ulcers:        Constitutional    Fever or chills:     PHYSICAL EXAM:   Vitals:  08/02/21 0847  BP: 125/77  Pulse: 65  Resp: 20  Temp: 98 F (36.7 C)  SpO2: 91%  Weight: 198 lb (89.8 kg)  Height: _0  (1.702 m)     GENERAL: The patient is a well-nourished female, in no acute distress. The vital signs are documented above. CARDIAC: There is a regular rate and rhythm.  VASCULAR: Carotid bifurcation was evaluated with SonoSite and appears to be easily accessible PULMONARY: Nonlabored respirations ABDOMEN: Soft and non-tender with normal pitched bowel sounds.  MUSCULOSKELETAL: There are no major deformities or cyanosis. NEUROLOGIC: No focal weakness or paresthesias are detected. SKIN: There are no ulcers or rashes noted. PSYCHIATRIC: The patient has a normal affect.  STUDIES:   I have reviewed the following carotid duplex: Right Carotid: Velocities in the right ICA are consistent with a 1-39%  stenosis.   Left Carotid: Velocities in the left ICA are consistent with a 1-39%  stenosis.   Vertebrals:  Bilateral vertebral arteries demonstrate antegrade flow.  Subclavians: Normal flow hemodynamics were seen in bilateral subclavian               arteries.   ASSESSMENT and PLAN   NYHA class III heart failure: The patient has been evaluated for Barostim implant.  I feel she is a good candidate.  I discussed the details of the operation as well as the risks and benefits.  All questions were answered.  This will be scheduled once we get insurance approval.   Leia Alf, MD, FACS [facility] [phone] Pager [phone]

## 2021-08-04 ENCOUNTER — Ambulatory Visit: Payer: Medicare Other | Admitting: Internal Medicine

## 2021-08-04 ENCOUNTER — Encounter: Payer: Self-pay | Admitting: Internal Medicine

## 2021-08-04 VITALS — BP 128/80 | HR 69 | Ht 67.0 in | Wt 197.0 lb

## 2021-08-04 DIAGNOSIS — J84112 Idiopathic pulmonary fibrosis: Secondary | ICD-10-CM | POA: Diagnosis not present

## 2021-08-04 DIAGNOSIS — R42 Dizziness and giddiness: Secondary | ICD-10-CM

## 2021-08-04 DIAGNOSIS — R0609 Other forms of dyspnea: Secondary | ICD-10-CM | POA: Diagnosis not present

## 2021-08-04 DIAGNOSIS — R053 Chronic cough: Secondary | ICD-10-CM

## 2021-08-04 DIAGNOSIS — J849 Interstitial pulmonary disease, unspecified: Secondary | ICD-10-CM

## 2021-08-04 MED ORDER — PREDNISONE 10 MG PO TABS: ORAL_TABLET | ORAL | 0 refills | Status: AC

## 2021-08-04 NOTE — Patient Instructions (Addendum)
ICD-10-CM   1. IPF (idiopathic pulmonary fibrosis) ([place])  J84.112     2. ILD (interstitial lung disease) ([place])  J84.9     3. Chronic cough  R05.3     4. Dyspnea on exertion  R06.09     5. Dizziness  R42       No evidence so far that fibrosis dramatically worse this year  Yet having lot of symptoms esp cough Wondering if feather pillow and sofa making thing worse Versys GERD or both The feather exposure makes me wonder if you have antoher ILD called HP   No sure what exertional dizziness is about - > ? Related to heart meds or heart condition itslfe  Plan Regarding cough and ILD - add otc cimetidine at night - get rid of all feathr related furniture  - Please take Take prednisone 76m once daily x 3 days, then 320monce daily x 3 days, then 2065mnce daily x 3 days, then prednisone 92m51mce daily  x 3 days and stop - let me see if  we can get your pathology (lung biopsy) read elsewhere sometime next few months  Regarding dizziness - let [place] if barostim helps your dizzineess - will write to your cardiologist  Followup  - 4 weeks do spirometry and dlco and return for 30 min vsiit

## 2021-08-04 NOTE — Progress Notes (Signed)
OV 08/19/2019  Subjective:  Patient ID: [facility], female , DOB: 1945/05/29 , age 76 y.o. , MRN: [id number] , ADDRESS: [place] [address]   08/19/2019 -   Chief Complaint  Patient presents with   Consult    COPD, emphysema.  sob with exertion.  dry coughs alot at night     HPI Susan [facility] 76 y.o. -she is a retired Marine scientist.  She used to work at the [employer] across [place] long and having retired from that 10 years ago and then she worked for another agency apparently having retired from all duties few years ago.  Husband is a retired Software engineer.  They are here for shortness of breath.  She tells me that she has had insidious onset of shortness of breath and cough that started 1 year ago and has been progressive.  In the same time she is also been on nitrofurantoin for times.  First 3 of these were 1 week each.  The last 1 being for at least 6 weeks ending approximately 4-6 weeks ago.  Because of progressive symptoms she was given Stiolto but this did not help.  She has been on longstanding Singulair for several years independent of these problems.  This because of allergies.  Therefore she has been referred here.  She did have a chest x-ray that on my personal visualization shows ILD changes.  It is documented below.  She did have a CT abdomen lung images that was reported as normal but I am not so sure from a year ago.  She is a previous smoker.  She is also self-referred herself to [facility] on the basis that she think she might have advanced COPD and is looking for a [facility] valve.  Symptoms started in August 2020.   [facility] Integrated Comprehensive ILD Questionnaire  Symptoms:  She does have a cough.  She says the cough started in August 2020 the same for shortness of breath started.  She does cough at night.  She does cough when she lies down and it gets worse.  She does feel a tickle in her throat.  Cough does not affect her voice  she does not bring any phlegm.  There is no wheezing.  There is no nausea no vomiting no diarrhea.    Past Medical History :   She does have a histo abdomen ry of asthma for the last few years.  Also COPD not otherwise specified for the last few years.  His acid reflux disease for the last few years.  Diabetes for the last few weeks and thyroid disease for the last few years.  Otherwise denies any collagen vascular disease or vasculitis.  Denies any sleep apnea.  Denies pulmonary hypertension.  Denies stroke denies pneumonias recurrent pneumonias.  Denies heart disease or pleurisy.   ROS: Positive for fatigue for the last several months.  Arthralgia for the last several months.  Dry eyes for the last several months.  With intermittent nausea for the last few years.  T there is daytime sleepiness for the last several months.     FAMILY HISTORY of LUNG DISEASE:  -Denies any pulmonary fibrosis.  Denies COPD denies asthma in the family.  Denies sarcoid denies cystic fibrosis denies hypersensitive pneumonitis.  Denies any autoimmune disease.   EXPOSURE HISTORY:   -She denies any Covid history.  Denies any exposure to Covid.  She did do Covid vaccine.  No reactions  after the Covid vaccine.  Did smoke cigarettes 15-year 2000 and 2015 10 to 12 cigarettes a day.  Did not do any passive smoking.  Never did any marijuana.  Elevated electronic cigarettes.  Never use cocaine never used intravenous drug use.   HOME and HOBBY DETAILS :  -[place]  Is lived there for 21 years.  Age of the home is 34 years.  In 2004 there was mold/mildew in the shower curtain.  And then there was mold under the shower.  This was torn down.  This was in 2004 but currently none.  She did some mulch back at work 2 months ago.  No humidifier use no CPAP use.  No nebulizer use.  No steam iron use.  No Jacuzzi use.  No misting Fountain in the house.  No pet birds or parakeets no gerbils.  No feather pillows.   No mold in the [facility] duct.  No music habits.   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 [facility] -patient herself does not recollect any mold exposure at the psychiatric unit.  No otehrr organic antigen exposure that she recollects (MR side note: a different patient in 2016 reported that in 2013-2016 time lot of renovations there and mold +).  Patient reported on September 10, 2019: That between 1966 and 1980 she worked at [employer] and [place].  At that time there is lot of mold exposure in the building.  The building was cleaned out.  Also checked for inorganic antigen exposure and this is negative.  Never worked in a dusty environment.  Never exposed to fumes.   PULMONARY TOXICITY HISTORY (27 items): Use nitrofurantoin in 2019 and 2020.  This is described below.  Question if you can for this there is      Simple office walk 185 feet x  3 laps goal with forehead probe 08/19/2019   O2 used ra  Number laps completed Attempted 3 but desaturated at 2  Comments about pace Normal pace  Resting Pulse Ox/HR 100% and 101/min  Final Pulse Ox/HR 86% and 116/min  Desaturated </= 88% yes  Desaturated <= 3% points Yes , 14  Got Tachycardic >/= 90/min yes  Symptoms at end of test dyspneic  Miscellaneous comments Corrected with  2L [place]    Nuclear medicine cardiac stress test March 2016: Overall Impression:  Normal stress nuclear study with a small, mild, fixed apical defect consistent with thinning; no ischemia. LV Wall Motion:  NL LV Function; NL Wall Motion    CT abdomen lung cuts September 2020: Reported as normal.  I personally visualized and retrospect looks hazy and whether this is atelectasis or some other findings I do not know.  Jul 19, 2019:: Personally visualized shows findings of ILD.  I am not able to see a prior chest x-ray in the system or CT scan of the chest    Results for [facility], Susan "PAM" (MRN [id number]) as of 08/19/2019 15:18  Ref.  Range 03/26/2019 13:45  Creatinine Latest Ref Range: 0.40 - 1.20 mg/dL 1.05   ROS - per HPI Results for [facility], Susan "PAM" (MRN [id number]) as of 08/19/2019 15:18  Ref. Range 03/26/2019 13:45  Hemoglobin Latest Ref Range: 12.0 - 15.0 g/dL 14.0     OV 09/10/2019  Subjective:  Patient ID: [facility], female , DOB: 1945/06/27 , age 20 y.o. , MRN: [id number] , ADDRESS: [address] [place] [address]  09/10/2019 -   Chief Complaint  Patient presents with   Follow-up    shortness of breath with exertion and non-productive cough     HPI [facility] 76 y.o. -presents for follow-up to discuss results of dyspnea work-up.  High-resolution CT chest was personally visualized.  It shows presence of interstitial lung disease.  It needs Fleischner criteria for diagnosis not consistent with UIP.  Per ATS this is alternative diagnosis to UIP.  Radiologist raised the concern of hypersensitive pneumonitis but there is no air-trapping.  The other possibilities NSIP.  She is no longer on nitrofurantoin.  In any event unit she only took it intermittently.  Overall she is stable since last visit.  She had autoimmune profile.  Is only trace positive for ANA.  Her IgE is slightly elevated.  Her PFT shows mild restriction with reduction in diffusion capacity.  We went over her exposure history again.  This time she did recollect being exposed to mold for 15 to 20 years up until 1980 while living in [place] and working at [employer].  She does not recollect mold at behavioral [facility] where she worked recently until she retired.  However another patient of mine did recollect mold in this building at [employer].   Low risk cardiac stress test in 2016 but with ejection fraction 54%.-Since then no overall change in health.  Husband inquired about the low ejection fraction.  In terms of symptoms: She is try to get portable oxygen system.  She is switching company to [employer] because of  the shortage of portable oxygen systems at [facility].     OV 11/12/2019   Subjective:  Patient ID: [facility], female , DOB: 1945-09-25, age 85 y.o. years. , MRN: [id number],  ADDRESS: [place] [address] PCP  Leamon Arnt, MD Providers : Treatment Team:  Attending Provider: Brand Males, MD Thoracic surgeon: Dr. Roxan Hockey Cardiologist Dr. Margaretann Loveless  Chief Complaint  Patient presents with   Follow-up    SOB    Follow-up interstitial lung disease not otherwise specified   HPI [facility] 76 y.o. -returns for follow-up.  Her husband is with her.  After her last visit she saw cardiothoracic surgery but we had to hold the surgical lung biopsy because her echocardiogram from September 13, 2019 showed a drop in left ventricular systolic ejection fraction.  Since then she is seen cardiology.  She had a heart catheterization that shows ejection action 40% still low.  Nonobstructive coronary artery disease.  She is followed up with cardiology and she has been started on beta-blocker.  On her father medications was also adjusted.  So far no change in dyspnea.  She tells me overall dyspnea is the same without any change.  Symptom scores shows a two-point shift with worsening but she told the medical assistant that she is feeling worse.  Walking desaturation test today shows - normalcy .  She wants portable oxygen but the first DME company [facility] did not have portable oxygen tanks.  She is found another company [employer] that is able to give her portable oxygen.  She needs a requalifying walk that is documented below.  In terms of surgical lung biopsy: She did have some prominent mediastinal nodes.  It appears Dr. Roxan Hockey has informed them that he will do a EBUS for this.  They have also been told by cardiology that from a cardiac standpoint patient would be a suitable candidate for surgical lung biopsy.  The husband wanted to know if a surgical biopsy is  indicated and also my perspective and safety.  xxxxxxxxxxxxxxxxxxxxxxxxxx Results for [facility], Susan "PAM" (MRN [id number]) as of 09/10/2019 13:56  Ref. Range 08/23/2019 10:53  FVC-Pre Latest Units: L 2.61  FVC-%Pred-Pre Latest Units: % 80  FEV1-Pre Latest Units: L 2.26  FEV1-%Pred-Pre Latest Units: % 92  Results for CHACE, BISCH "PAM" (MRN [id number]) as of 09/10/2019 13:56  Ref. Range 08/23/2019 10:53  DLCO cor Latest Units: ml/min/mmHg 13.76  DLCO cor % pred Latest Units: % 64   Results for [facility], Susan "PAM" (MRN [id number]) as of 09/10/2019 13:56  Ref. Range 08/23/2019 10:53  TLC Latest Units: L 4.15  TLC % pred Latest Units: % 75   xxxxxxxxxxxxxxxxxxxxxx  CT chest high resolution June 2021 Lungs/Pleura: Mild pulmonary fibrosis in a pattern with apical predominance, featuring irregular peripheral interstitial opacity and scattered areas of subpleural bronchiolectasis. Mild, tubular bronchiectasis throughout. No significant air trapping on expiratory phase imaging. No pleural effusion or pneumothorax.   Upper Abdomen: No acute abnormality.   Musculoskeletal: No chest wall mass or suspicious bone lesions identified.   IMPRESSION: 1. Mild pulmonary fibrosis in a pattern with apical predominance, featuring irregular peripheral interstitial opacity and scattered areas of subpleural bronchiolectasis. Mild, tubular bronchiectasis throughout. Findings are most consistent with an "alternative diagnosis" pattern by ATS pulmonary fibrosis criteria, leading differential considerations chronic fibrotic hypersensitivity pneumonitis or NSIP. 2. Prominent mediastinal lymph nodes, nonspecific and likely reactive. 3. Coronary artery disease.  Aortic Atherosclerosis (ICD10-I70.0).     Electronically Signed   By: Eddie Candle M.D.   On: 09/04/2019 15:37    xxxxxxxxxxxxxxxxxxxxxxxxxxxxxxxxxxxxxxxxxxxxxxxxxx  Results for REATA, PETROV "PAM" (MRN [id number]) as of  09/10/2019 13:56  Ref. Range 08/19/2019 16:30 08/19/2019 16:30  SEE BELOW Unknown  Comment  Anti Nuclear Antibody (ANA) Latest Ref Range: NEGATIVE  POSITIVE (A)   ANA Pattern 1 Unknown Cytoplasmic (A)   ANA Titer 1 Unknown 1:80 (H) Negative  Angiotensin-Converting Enzyme Latest Ref Range: 9 - 67 U/L 35   Anti JO-1 Latest Ref Range: 0.0 - 0.9 AI  <0.2  CENTROMERE AB SCREEN Latest Ref Range: 0.0 - 0.9 AI  <6.2  Cyclic Citrullin Peptide Ab Latest Units: UNITS <16   dsDNA Ab Latest Ref Range: 0 - 9 IU/mL  5  ENA RNP Ab Latest Ref Range: 0.0 - 0.9 AI  0.6  ENA SSA (RO) Ab Latest Ref Range: 0.0 - 0.9 AI  <0.2  ENA SSB (LA) Ab Latest Ref Range: 0.0 - 0.9 AI  <0.2  Myeloperoxidase Abs Latest Units: AI <1.0   Serine Protease 3 Latest Units: AI <1.0   RA Latex Turbid. Latest Ref Range: <14 IU/mL <14   ENA SM Ab Ser-aCnc Latest Ref Range: 0.0 - 0.9 AI  <0.2  Chromatin Ab SerPl-aCnc Latest Ref Range: 0.0 - 0.9 AI  <0.2     has a past medical history of Asthma, Depression, Emphysema of lung ([place]), Hemorrhoids, Hyperlipidemia, Hypertension, Hypothyroidism, Obesity, Rosacea, and Tubular adenoma of colon (03/26/2019).  IMPRESSIONS   ECHO 09/13/19  1. Left ventricular ejection fraction, by estimation, is 30 to 35%. The  left ventricle has moderately decreased function. The left ventricle  demonstrates global hypokinesis. Apex poorly visualized, consider repeat  limited echo with contrast to rule out  apical thrombus. The left ventricular internal cavity size was moderately  dilated. Left ventricular diastolic parameters are consistent with Grade I  diastolic dysfunction (impaired relaxation).   2.  Right ventricular systolic function is normal. The right ventricular  size is normal. Tricuspid regurgitation signal is inadequate for assessing  PA pressure.   3. The mitral valve is normal in structure. Trivial mitral valve  regurgitation.   4. The aortic valve was not well visualized. Aortic valve  regurgitation  is not visualized. No aortic stenosis is present.  CATH 8?20/21 Prox LAD to Mid LAD lesion is 30% stenosed. Ost Cx to Prox Cx lesion is 35% stenosed. Prox RCA lesion is 35% stenosed. There is moderate left ventricular systolic dysfunction. LV end diastolic pressure is normal. The left ventricular ejection fraction is 35-45% by visual estimate.   1. Nonobstructive CAD 2. Moderate LV dysfunction. EF estimated at 40% with global hypokinesis 3. Normal LV filling pressures 4. Normal right heart pressures 5. Normal cardiac output.    Plan: medical therapy for LV dysfunction and lipid lowering therapy for nonobstructive CAD.    OV 12/27/2019  Subjective:  Patient ID: [facility], female , DOB: 03-21-1945 , age 55 y.o. , MRN: [id number] , ADDRESS: [place] [address] PCP Leamon Arnt, MD Patient Care Team: Leamon Arnt, MD as PCP - General (Family Medicine) Elroy Channel, MD as Referring Physician (Internal Medicine) Rockey Situ as Consulting Physician (Obstetrics and Gynecology) Trinda Pascal, FNP as Consulting Physician (Urology) Brand Males, MD as Consulting Physician (Pulmonary Disease)  This Provider for this visit: Treatment Team:  Attending Provider: Brand Males, MD    12/27/2019 -   Chief Complaint  Patient presents with   Follow-up    discuss biopsy results. SOB sometimes. denies cough or wheezing.     HPI REINETTE CUNEO 76 y.o. --follow-up interstitial lung disease.  Had surgical biopsy December 13, 2019.  After that she is was in the [facility] requiring oxygen but then got discharged without oxygen.  At this point time she is much improved but still having soreness from her chest incision site.  Husband and she are here.  They are concerned about some postoperative atelectasis that was seen.  I visualized the x-ray.  Today walking desaturation test is normal.  I reassured them about this.  But I  will give her incentive spirometer.  The main issue is to discuss the biopsy results: The bronchoalveolar lavage shows mixed cellularity with neutrophilia.  The surgical lung biopsy itself shows UIP with just 1 carcinoid tumor.  Reviewing her clinical history this UIP pattern is now consistent with IPF.  I gave her the diagnosis.  Regarding the carcinoid tumor let: I think this is an incidental finding.  There is no  DIPNECH syndrome  Her current symptom and walk profile are stable.   Results for TYJA, GORTNEY "PAM" (MRN [id number]) as of 12/27/2019 12:06  Ref. Range 12/13/2019 08:05  Color, Fluid Latest Ref Range: YELLOW  COLORLESS (A)  Total Nucleated Cell Count, Fluid Latest Ref Range: 0 - 1,000 cu mm 18  Lymphs, Fluid Latest Units: % 25  Eos, Fluid Latest Units: % 8  Appearance, Fluid Latest Ref Range: CLEAR  HAZY (A)  Other Cells, Fluid Latest Units: % MESOTHELIAL CELLS NOTED  Neutrophil Count, Fluid Latest Ref Range: 0 - 25 % 44 (H)  Monocyte-Macrophage-Serous Fluid Latest Ref Range: 50 - 90 % 23 (L)    OV 02/04/2020   Subjective:  Patient ID: [facility], female , DOB: 11-26-1945, age 40 y.o. years. , MRN: [id number],  ADDRESS: [place] [address] PCP  Jonni Sanger,  Karie Fetch, MD Providers : Treatment Team:  Attending Provider: Brand Males, MD Patient Care Team: Leamon Arnt, MD as PCP - General (Family Medicine) Elroy Channel, MD as Referring Physician (Internal Medicine) Rockey Situ as Consulting Physician (Obstetrics and Gynecology) Trinda Pascal, FNP as Consulting Physician (Urology) Brand Males, MD as Consulting Physician (Pulmonary Disease)    Chief Complaint  Patient presents with   Follow-up    ILD, doing well   Follow-up idiopathic pulmonary fibrosis diagnosed on surgical lung biopsy 12/13/2019.  Diagnosis formally given on 12/27/2019 Commenced pirfenidone late October 2021/early November  2021     HPI TAFFY DELCONTE 76 y.o. -returns for follow-up with her husband.  This visit is mainly to focus on pirfenidone uptake.  She has been taking pirfenidone for 3 weeks or more.  She is currently on full dose 3 pills 3 times a day.  The husband wants to know when we would roll to a full largest x1 pill 3 times daily.  I indicated to them that it would be 3 to 4 months of stability with pirfenidone before we made the decision.  Currently she is tolerating pirfenidone well.  She did have some mild nausea and she thinks it is because she did not take a full meal.  Overall she is handling pirfenidone well without any problems.  No fatigue no weight loss.  Respiratory wise she is stable.  She did get in touch with a local support group.  She is asking about pulmonary rehabilitation referral and she is interested in this.  One of the main issues they wanted to discuss was the fact the pirfenidone refill in the supply chain was not consistent.  There was anxiety that they were not going to get the refill.  She had to call the office.  She believes that it is because she made the call to the office that the refill was sent.  She says she tried to call [place] without any response for a long time.  I have indicated to her that I will ask our pharmacist to review and explained the logistics.  She will have a repeat LFT today      OV 04/02/2020  Subjective:  Patient ID: [facility], female , DOB: 03-Dec-1945 , age 15 y.o. , MRN: [id number] , ADDRESS: [place] [address] PCP Leamon Arnt, MD Patient Care Team: Leamon Arnt, MD as PCP - General (Family Medicine) Elroy Channel, MD as Referring Physician (Internal Medicine) Rockey Situ as Consulting Physician (Obstetrics and Gynecology) Trinda Pascal, FNP as Consulting Physician (Urology) Brand Males, MD as Consulting Physician (Pulmonary Disease)  This Provider for this visit: Treatment Team:   Attending Provider: Brand Males, MD    04/02/2020 -   Chief Complaint  Patient presents with   Follow-up    Pt states she has been doing well since last visit and denies any complaints.   Follow-up idiopathic pulmonary fibrosis diagnosed on surgical lung biopsy 12/13/2019.  Diagnosis formally given on 12/27/2019 Commenced pirfenidone late October 2021/early November 2021  HPI MACLOVIA UHER 76 y.o. -returns for follow-up.  Presents with her husband.  She is tolerating pirfenidone fine with just very mild occasional nausea.  She wants to roll over to the 1 pill 3 times daily.  I have sent a message through secure chat to the pharmacist.  She is going to start pulmonary rehabilitation in February 2022.  We discussed lung transplantation but at this  point we will hold off.  She will check liver function test today.    There are no new issues.  She is already touch base with the support group her symptom score shows stability.  Of note she has returned to portable oxygen because she feels she does not need it.  Walking desaturation test confirms that.    She is interested in clinical trials.  She is touch base with cardiology and looking at a heart failure trial there.  She is also given her name for pulmonary fibrosis trials but currently there is a backlog.  I then emailed Berneda Rose cardiology Geophysicist/field seismologist and she replied saying, "She was in our [facility] trial which her enrollment is complete. I am not aware of any other trial we are screening her for currently but will ask the team. I think IPF trial option would be in her best interest. I also think IPF will exclude her from our HF device trials.  "   PF   OV 07/23/2020  Subjective:  Patient ID: [facility], female , DOB: [birth year] , age 58 y.o. , MRN: [id number] , ADDRESS: [place] [id number] PCP Leamon Arnt, MD Patient Care Team: Leamon Arnt, MD as PCP - General (Family  Medicine) Elouise Munroe, MD as PCP - Cardiology (Cardiology) Elroy Channel, MD as Referring Physician (Internal Medicine) Rockey Situ as Consulting Physician (Obstetrics and Gynecology) Trinda Pascal, FNP as Consulting Physician (Urology) Brand Males, MD as Consulting Physician (Pulmonary Disease)  This Provider for this visit: Treatment Team:  Attending Provider: Brand Males, MD    07/23/2020 -   Chief Complaint  Patient presents with   Follow-up    IPF/ILD, denies changes   Follow-up idiopathic pulmonary fibrosis diagnosed on surgical lung biopsy 12/13/2019.  Diagnosis formally given on 12/27/2019 Commenced pirfenidone late October 2021/early November 2021. Screen failed for Pliant Part D trial due to soc coreg intake - Spring 2022. IPF final dx on MDD Jul 21, 2020  HPI SAVAHNA CASADOS 76 y.o. -returns for follow-up.  At this point in time she is tolerating pirfenidone well.  In fact no side effects.  Her symptom score is stable.  She had high-resolution CT chest as part of a research protocol and there is no progressions in September 2021.  Her walking desaturation test shows a tendency to drop.  Is unclear if it is worse or the same because she walks faster than usual.  She feels stable.  However April 2022 she had recent PFTs FVC, 2.122, 71.6%, DLCO: 11.4, 54% -> this would suggest a decline.  Of note she try to qualify for the phase 2 study CALLED  Pliant..  However she is on carvedilol for hypertension control.  Hypertensive related to the reason of her cardiomyopathy.  Therefore she is screen failed.  She has good control of blood pressure with carvedilol.  She is interested in other research trials that she might qualify for.  Her husband Netanya Yazdani is with her today.        OV 01/04/2021  Subjective:  Patient ID: [facility], female , DOB: [birth year] , age 37 y.o. , MRN: [id number] , ADDRESS: [address] [place]   [id number] PCP Leamon Arnt, MD Patient Care Team: Leamon Arnt, MD as PCP - General (Family Medicine) Elouise Munroe, MD as PCP - Cardiology (Cardiology) Elroy Channel, MD as Referring Physician (Internal Medicine) Rockey Situ as Consulting Physician (Obstetrics  and Gynecology) Trinda Pascal, FNP as Consulting Physician (Urology) Brand Males, MD as Consulting Physician (Pulmonary Disease)  This Provider for this visit: Treatment Team:  Attending Provider: Brand Males, MD    01/04/2021 -   Chief Complaint  Patient presents with   Follow-up    Pt states she had a spell 4 weeks ago where her BP dropped too low to where she passed out and fell into a dresser which caused her to break some ribs. Pt said since this happened, she has had a little increased SOB as she is not able to take a deep breath.   Follow-up idiopathic pulmonary fibrosis diagnosed on surgical lung biopsy 12/13/2019.  Diagnosis formally given on 12/27/2019 Commenced pirfenidone late October 2021/early November 2021. Screen failed for Pliant Part D trial due to soc coreg intake - Spring 2022. IPF final dx on MDD Jul 21, 2020  HPI CIENNA DUMAIS 76 y.o. -returns for follow-up.  Last seen in late spring early summer 2022.  She presents with her husband Merry Proud.  She tells me that in August 2022 she broke her right tibia after having a slip and fall on the wet floor.  She then was seen by Dr. Mardelle Matte.  She was in a cast.  Then the cast came off then a few weeks later which was approximately 4 weeks ago in September 2022 she was getting out of bed and then fell down and sustained multiple rib fractures not otherwise specified.  She does not know which rib fractures.  She has not had a CT to confirm rib fracture but only had a chest x-ray.  Her husband believes this is because of orthostasis from her antihypertensives.  Most recently she is seeing cardiology Dr. Margaretann Loveless and has been referred to  the heart failure service.  She also tells me that Dr. Mardelle Matte told her that the chest x-ray suggested she might need thoracentesis and that one of the reason she is here today.  I am not able to visualize a chest x-ray the last chest x-ray was in July 2022 that I personally visualized and this showed pulmonary fibrosis..  At this point in time she is having significant symptoms because of the rib fracture particularly in terms of pain.  Her pain level is 4 out of 5.  This is preventing her from walking and mobilizing well.  She also believe she has gained 15 pounds of weight after starting the pirfenidone.  She says this weight gain happened before the rib fractures.  She is surprised because most often pirfenidone causes weight loss she believes the weight gain is because of pirfenidone.  She believes her thyroid function was normal.  Review of the chart indicates 8 pound weight gain happening mostly between January 2022 and May 2022.  Husband also acknowledges that she is more sedentary  Symptom score shows worsening.  Walking desaturation test is documented below -typically she will be able to complete 3 laps and desaturate 7 8 points although still adequate.  Today she stopped it 2 laps because of shortness of breath.  She did not even have a tachycardic response.  She only dropped 3 points pulse ox.  All of this is is more consistent with deconditioning and splinting from rib fracture rather than pulmonary fibrosis getting worse.     Results for ADRIONNA, DELCID "PAM" (MRN [id number]) as of 01/04/2021 15:19  Ref. Range 12/11/2020 12:08  AST Latest Ref Range: 10 - 35 U/L 20  ALT Latest  Ref Range: 6 - 29 U/L 16    OV 02/11/2021  Subjective:  Patient ID: Susan Hunt, female , DOB: 03/13/1946 , age 57 y.o. , MRN: [id number] , ADDRESS: [address] [place] [address] PCP Leamon Arnt, MD Patient Care Team: Leamon Arnt, MD as PCP - General (Family Medicine) Elouise Munroe, MD as PCP - Cardiology (Cardiology) Elroy Channel, MD as Referring Physician (Internal Medicine) Rockey Situ as Consulting Physician (Obstetrics and Gynecology) Trinda Pascal, FNP as Consulting Physician (Urology) Brand Males, MD as Consulting Physician (Pulmonary Disease)  This Provider for this visit: Treatment Team:  Attending Provider: Brand Males, MD    02/11/2021 -   Chief Complaint  Patient presents with   Follow-up    Pt states she is doing better compared to last visit and states that her ribs are also better as well.   Follow-up idiopathic pulmonary fibrosis diagnosed on surgical lung biopsy 12/13/2019.  Diagnosis formally given on 12/27/2019 Commenced pirfenidone late October 2021/early November 2021. Screen failed for Pliant Part D trial due to soc coreg intake - Spring 2022. IPF final dx on MDD Jul 21, 2020  HPI JACKILYN UMPHLETT 76 y.o. -returns for follow-up.  This visit is to ensure that ILD is not getting worse.  At last visit in the aftermath of her fall and rib fracture she was in significant pain and she is having more shortness of breath.  We could not determine if the ILD was getting worse and was the reason for shortness of breath of the rib fracture was the reason for increased shortness of breath.  We also could not perform pulmonary function testing.  At this point in time she is reporting improved symptoms.  The pain is completely resolved.  She feels the rib fractures have healed.  She is tolerating pirfenidone well without any side effects.  She is also lost some weight.  She is interested in clinical trials.  She feels she can do a PFT again.  Symptom score below shows improvement.   CT Chest data  No results found.     OV 04/29/2021  Subjective:  Patient ID: Susan Hunt, female , DOB: Apr 18, 1945 , age 65 y.o. , MRN: [id number] , ADDRESS: [place] [id number] PCP Leamon Arnt, MD Patient Care  Team: Leamon Arnt, MD as PCP - General (Family Medicine) Elouise Munroe, MD as PCP - Cardiology (Cardiology) Elroy Channel, MD as Referring Physician (Internal Medicine) Rockey Situ as Consulting Physician (Obstetrics and Gynecology) Trinda Pascal, FNP as Consulting Physician (Urology) Brand Males, MD as Consulting Physician (Pulmonary Disease)  This Provider for this visit: Treatment Team:  Attending Provider: Brand Males, MD    04/29/2021 -   Chief Complaint  Patient presents with   Follow-up    PFT performed today. Pt states she has been coughing a lot since last visit.    Follow-up idiopathic pulmonary fibrosis diagnosed on surgical lung biopsy 12/13/2019.  Diagnosis formally given on 12/27/2019 Commenced pirfenidone late October 2021/early November 2021. Screen failed for Pliant Part D trial due to soc coreg intake - Spring 2022. IPF final dx on MDD Jul 21, 2020 HPI HORTENCIA MARTIRE 76 y.o. -returns for follow-up.  Her husband Merry Proud is not with her today.  He is standing outside and talking on the phone apparently.  She tells me that overall she is stable.  She says she has stable dyspnea on exertion although  on the symptom score her dyspnea score is now 14 and slightly worse.  She tells me that she gets quite short of breath carrying the 12 pound dog but she categorically insist that this is the same level of dyspnea even compared to 1 year ago.  Her main issue is that for the last 1-2 months she is got insidious onset of chronic cough that is new onset and getting worse.  Happened after the last visit it is dry it happens in the middle of the day and night but at night it is worse.  It is very mild is a level 2 out of 5 at 1 out of 5.  There is no wheezing or chest pain orthopnea.    She had pulmonary function test today.  The inspiratory loop does not look all that robust the FVC appears 18% decline compared to a year and a half ago while the DLCO  is worse around 10%..  She is very surprised further decline in PFTs because she is not feeling it.  She has upcoming research consent visit for [facility] study on 05/10/2021.  The study looks at the phase 3 investigational medical product of inhaled treprostinil versus placebo.  Inhaled treprostinil has been on the market for over 20 years.  Few years ago it was approved for WHO group 3 pulmonary hypertension secondary to ILD.  In this particular study the main question is if inhaled treprostinil will be a primary agent against IPF.  She is looking forward to participating in the study she already has a consent form.  After the consent per protocol she is supposed to have a high-resolution CT chest.   No results found.     OV 07/22/2021  Subjective:  Patient ID: Susan Hunt, female , DOB: March 22, 1945 , age 50 y.o. , MRN: [id number] , ADDRESS: [place] [id number] PCP Leamon Arnt, MD Patient Care Team: Leamon Arnt, MD as PCP - General (Family Medicine) Elouise Munroe, MD as PCP - Cardiology (Cardiology) Elroy Channel, MD as Referring Physician (Internal Medicine) Rockey Situ as Consulting Physician (Obstetrics and Gynecology) Trinda Pascal, FNP as Consulting Physician (Urology) Brand Males, MD as Consulting Physician (Pulmonary Disease)  This Provider for this visit: Treatment Team:  Attending Provider: Brand Males, MD    07/22/2021 -   Chief Complaint  Patient presents with   Follow-up    PFT performed today.  Pt states she has been doing okay since last visit and denies any complaints.    Follow-up idiopathic pulmonary fibrosis diagnosed on surgical lung biopsy 12/13/2019.  Diagnosis formally given on 12/27/2019. Also discussed MDD Jul 21, 2020  Commenced pirfenidone late October 2021/early November 2021.  - Screen failed for Pliant Part D trial due to soc coreg intake - Spring 2022.  -Screen for for [facility] inhaled  treprostinil study after blinded pathologist did not agree with UIP  HPI MYEASHA BALLOWE 76 y.o. -returns for follow-up.  Since last visit she is stable.  She does acknowledge compared to a year or 2 ago she is more short of breath.  Pulmonary function test compared to February 2023 and today is stable but compared to 2021 there is 18% decline in [place] in 2 years.  She is doing a lot of yard work and this does give her out.  Therefore she also think she might be more short of breath.  Her symptom score suggest some possible increase.  She is not using  any oxygen.  Recently she tried to enroll in the inhaled treprostinil study for IPF but she was disqualified because a blinded pathologist did not agree with our pathologist.  Our case conference discussion is that she has IPF.  Of note because of nonischemic cardiomyopathy chronic systolic dysfunction she is being considered for  BAROSTIM.  I reviewed the cardiology notes.  Apparently they offer this as a research protocol but given the fact that IPF is considered more of a bad prognosis she wants to enroll in IPF study.  I agree with this.  We will looking at the study called [facility] Phase 2 agent BESIPOROCIN versus placebo that directly ask on Prolene and collagen.  She is interested in this.  She will have a lot of investigations through the study.  Otherwise no new issues.  She does mention that one of her husbands golfing friends is my patient.   Her last blood work for liver function test was a few months ago.      OV 08/04/2021  Subjective:  Patient ID: Susan Hunt, female , DOB: [birth year] , age 35 y.o. , MRN: [id number] , ADDRESS: [place] [id number] PCP Leamon Arnt, MD Patient Care Team: Leamon Arnt, MD as PCP - General (Family Medicine) Elouise Munroe, MD as PCP - Cardiology (Cardiology) Elroy Channel, MD as Referring Physician (Internal Medicine) Rockey Situ as Consulting Physician  (Obstetrics and Gynecology) Trinda Pascal, FNP as Consulting Physician (Urology) Brand Males, MD as Consulting Physician (Pulmonary Disease)  This Provider for this visit: Treatment Team:  Attending Provider: Brand Males, MD    08/04/2021 -   Chief Complaint  Patient presents with   Acute Visit    Pt states she has had worsening SOB for the past week that she states has been worse all the time. Also has had a lot of coughing but is not able to get any phlegm up.     Follow-up idiopathic pulmonary fibrosis   HRCT   - June 2021 - alt dx NSIP v HP   - Sept 2021 - alt dx - Air trapping +  ? HP   - Last HRCT Feb 2023 - diagnosed on surgical lung biopsy 12/13/2019. -. UIP per our pathologst (note in 2023 blinded pathologist disagreed wit this) -   Diagnosis formally given on 12/27/2019.  - Also discussed MDD Jul 21, 2020  Commenced pirfenidone late October 2021/early November 2021.  - Screen failed for Pliant Part D trial due to soc coreg intake - Spring 2022.  -Screen for for [facility] inhaled spring 2023 treprostinil study after blinded pathologist did not agree with our UIP dx   HPI MALALA TRENKAMP 76 y.o. - ACUTE visit. She seemed stable when I saw her  a few week  ago. BUt she called 3 days later on 07/25/21 with a hacking non-productive cough. Given Z pak and and 8d prednisone on 07/26/21 that ended 2d ago. She says now that cough is back. Coughing day and night. Less cough with prdnison and when prone. More cough when supine. It is dry. As I spoke to her and again inquierd about exposures they denied at first but later husband reminded her that the pillow is feather pillow. Been using it long time. Has sofa that has feather. Occ the feather comes out. Marland Kitchen EThe cough is perdnisone responsivle.    Med review does not show any ace inhibito but she is on ARB  She is on  PPI  She is on singulair  Of note, noticed this past year she can only walk 2 of our 3 laps. She stps  becaue of dyspnea but recent CT/PFT show stability. ECHO shows stable NICM 35%. She then told me she actually feels very dizzzy after 1-2 laps with [place]. This is ongoing x 1 year. I alerted Dr Margaretann Loveless - because there is no clear cut evidence of significant ILD decline - Dr Margaretann Loveless will see her this week  . Of note reg NICM - saw Dr Trula Slade on 08/02/21 and they are working on getting her a barostim as [place] - ILD.td  08/19/2019  11/12/2019  12/27/2019 Last Weight  Most recent update: 12/27/2019 11:48 AM     Weight  89.7 kg (197 lb 12.8 oz)             02/04/2020 196#  04/02/2020 197# 07/23/2020 204# 01/04/2021 203# 02/11/2021 201# 04/29/2021 200# 07/22/2021 199# 08/04/2021 197#  O2 use _0  _1  ra  Shortness of Breath 0 -> 5 scale with 5 being worst (score 6 If unable to do)            At rest _2 0  Simple tasks - showers, clothes change, eating, shaving _3 Household (dishes, doing bed, laundry) _4 Shopping _5 Walking level at own pace _6 0 _7 Walking up Stairs _8 Total (30-36) Dyspnea Score _9 How bad is your cough? x _10  0H0o0w bad is your fatigue x _11 0 _12 How bad is nausea x _13  How bad is vomiting?  x _14  How bad is diarrhea? x _15  How bad is anxiety? x _16  How bad is depression x _17  Chronic pain    0 no  4 0 0 0 0  0   Simple office walk 185 feet x  3 laps goal with forehead probe 08/19/2019  11/12/2019  12/27/19 BRL 04/02/2020 gso - she has returned her portabl o2 07/23/2020  01/04/2021  04/29/2021   08/04/2021   O2 used _18  ra ra ra  Number laps completed Attempted 3 but desaturated at 2 Did alll 3 on this repeat walk - on beta blocker    2 of 3 laps 2 of 3 2 of 3   Comments about pace Normal pace  Nl pace avg avg slow avg avg  Resting Pulse Ox/HR 100% and 101/min 96% and 60/min 94% and 71/min 100% and 60 99% and 76 99% and HR  73 98% and 75 98% and HR 69  Final Pulse Ox/HR 86% and 116/min 94% and 80/min 96% and 87/min 94% ns 79 90%  and HR 84 96% and HR 89 96% and HR 85 94% and HR 86  Desaturated </= 88% yes no   no no no ni  Desaturated <= 3% points Yes , 14 no   Yes, 9 poins Yes, 3 points no Yes 4 points  Got Tachycardic >/= 90/min yes no   no Modrae dyspnea Mod dyspnea Mod dyspnea, dizzines   Symptoms at end of test dyspneic mild none noon no     Miscellaneous comments Corrected with  2L [place] Will return o2 back to dme   no deconditined stabe Dizziness biggest issue    Narrative & Impression  CLINICAL DATA:  Interstitial lung disease, history of pulmonary fibrosis   EXAM: CT CHEST WITHOUT CONTRAST   TECHNIQUE: Multidetector CT imaging of the chest was performed following the standard protocol without intravenous contrast. High resolution imaging of the lungs, as well as inspiratory and expiratory imaging, was performed.   RADIATION DOSE REDUCTION: This exam was performed according to the departmental dose-optimization program which includes automated exposure control, adjustment of the mA and/or kV according to patient size and/or use of iterative reconstruction technique.   COMPARISON:  06/25/2020, 11/29/2019, 09/04/2019   FINDINGS: Cardiovascular: Aortic atherosclerosis. Cardiomegaly. Three-vessel coronary artery calcifications and/or stents. No pericardial effusion.   Mediastinum/Nodes: No enlarged mediastinal, hilar, or axillary lymph nodes. Thyroid gland, trachea, and esophagus demonstrate no significant findings.   Lungs/Pleura: Unchanged, moderate pulmonary fibrosis in a pattern without clear apical to basal gradient, featuring irregular peripheral interstitial opacity, septal thickening, and areas of subpleural  bronchiolectasis without evidence of honeycombing. Wedge biopsy suture lines of the right lung. No pleural effusion or pneumothorax.   Upper Abdomen: No acute abnormality.   Musculoskeletal: No chest wall abnormality. No suspicious osseous lesions identified. New, sclerotic wedge deformity of the T9 vertebral body with approximately 30% anterior height loss (series 11, image 74).   IMPRESSION: 1. Unchanged, moderate pulmonary fibrosis in a pattern without clear apical to basal gradient, featuring irregular peripheral interstitial opacity, septal thickening, and areas of subpleural bronchiolectasis without evidence of honeycombing. Findings in a "probable UIP" pattern and in keeping with wedge biopsy diagnosis of UIP/IPF. 2. Cardiomegaly and coronary artery disease. 3. New, although age indeterminate, sclerotic wedge deformity of the T9 vertebral body with approximately 30% anterior height loss. Correlate for acute pain and point tenderness.   Aortic Atherosclerosis (ICD10-I70.0).     Electronically Signed   By: Delanna Ahmadi M.D.   On: 05/11/2021 12:07      VAS US CAROTID  Result Date: 08/02/2021 Carotid Arterial Duplex Study Patient Name:  JOURNIEE FELDKAMP  Date of Exam:   08/02/2021 Medical Rec #: [id number]           Accession #:    [id number] Date of Birth: 21-Mar-1945            Patient Gender: F Patient Age:   31 years Exam Location:  [facility] Procedure:      VAS US CAROTID Referring Phys: Harold Barban --------------------------------------------------------------------------------  Indications:  Carotid artery disease. Risk Factors: Hypertension, Diabetes. Performing Technologist: Alvia Grove RVT  Examination Guidelines: A complete evaluation includes B-mode imaging, spectral Doppler, color Doppler, and power Doppler as needed of all accessible portions of each vessel. Bilateral testing is considered an integral part of a complete examination. Limited  examinations for reoccurring indications may be performed as noted.  Right Carotid Findings: +----------+--------+--------+--------+-------------------------+--------+           PSV cm/sEDV cm/sStenosisPlaque Description  Comments +----------+--------+--------+--------+-------------------------+--------+ CCA Prox  102     16                                                +----------+--------+--------+--------+-------------------------+--------+ CCA Mid   72      14                                                +----------+--------+--------+--------+-------------------------+--------+ CCA Distal66      15                                                +----------+--------+--------+--------+-------------------------+--------+ ICA Prox  133     34      1-39%   calcific and heterogenous         +----------+--------+--------+--------+-------------------------+--------+ ICA Mid   120     31                                                +----------+--------+--------+--------+-------------------------+--------+ ICA Distal86      25                                                +----------+--------+--------+--------+-------------------------+--------+ ECA       93      8                                                 +----------+--------+--------+--------+-------------------------+--------+ +----------+--------+-------+----------------+-------------------+           PSV cm/sEDV cmsDescribe        Arm Pressure (mmHG) +----------+--------+-------+----------------+-------------------+ Subclavian170     7      Multiphasic, WNL                    +----------+--------+-------+----------------+-------------------+ +---------+--------+--+--------+--+---------+ VertebralPSV cm/s62EDV cm/s14Antegrade +---------+--------+--+--------+--+---------+  Left Carotid Findings: +----------+--------+--------+--------+------------------+--------+           PSV  cm/sEDV cm/sStenosisPlaque DescriptionComments +----------+--------+--------+--------+------------------+--------+ CCA Prox  78      15                                         +----------+--------+--------+--------+------------------+--------+ CCA Mid   85      17              heterogenous               +----------+--------+--------+--------+------------------+--------+ CCA Distal80      18              heterogenous               +----------+--------+--------+--------+------------------+--------+ ICA Prox  124  28      1-39%   calcific                   +----------+--------+--------+--------+------------------+--------+ ICA Mid   68      17                                         +----------+--------+--------+--------+------------------+--------+ ICA Distal89      25                                         +----------+--------+--------+--------+------------------+--------+ ECA       75      9                                          +----------+--------+--------+--------+------------------+--------+ +----------+--------+--------+--------+-------------------+           PSV cm/sEDV cm/sDescribeArm Pressure (mmHG) +----------+--------+--------+--------+-------------------+ Subclavian240     4                                   +----------+--------+--------+--------+-------------------+ +---------+--------+--+--------+--+---------+ VertebralPSV cm/s33EDV cm/s12Antegrade +---------+--------+--+--------+--+---------+ Right Brachial 151 cm/s, left 158 cm/s triphasic waveforms  Summary: Right Carotid: Velocities in the right ICA are consistent with a 1-39% stenosis. Left Carotid: Velocities in the left ICA are consistent with a 1-39% stenosis. Vertebrals:  Bilateral vertebral arteries demonstrate antegrade flow. Subclavians: Normal flow hemodynamics were seen in bilateral subclavian              arteries. *See table(s) above for measurements and  observations.  Electronically signed by Harold Barban MD on 08/02/2021 at 3:56:20 PM.    Final       PFT     Latest Ref Rng & Units 07/22/2021    3:27 PM 04/29/2021    2:58 PM 08/23/2019   10:53 AM  PFT Results  FVC-Pre L 2.05  P 2.13   2.61    FVC-Predicted Pre % 65  P 67   80    FVC-Post L   2.57    FVC-Predicted Post %   79    Pre FEV1/FVC % % 86  P 88   87    Post FEV1/FCV % %   92    FEV1-Pre L 1.77  P 1.87   2.26    FEV1-Predicted Pre % 74  P 79   92    FEV1-Post L   2.36    DLCO uncorrected ml/min/mmHg 12.27  P 12.28   13.84    DLCO UNC% % 58  P 58   65    DLCO corrected ml/min/mmHg 12.27  P 12.28   13.76    DLCO COR %Predicted % 58  P 58   64    DLVA Predicted % 97  P 96   88    TLC L   4.15    TLC % Predicted %   75    RV % Predicted %   63      P Preliminary result    ECHO March 2023   Sonographer Comments: Technically difficult study due to poor echo  windows.  IMPRESSIONS  1. Left ventricular ejection fraction, by estimation, is 30 to 35%. The  left ventricle has moderately decreased function. The left ventricle  demonstrates global hypokinesis. Left ventricular diastolic parameters are  consistent with Grade I diastolic  dysfunction (impaired relaxation).   2. Right ventricular systolic function is mildly reduced. The right  ventricular size is normal. There is normal pulmonary artery systolic  pressure.   3. Left atrial size was moderately dilated.   4. Right atrial size was mildly dilated.   5. The mitral valve is normal in structure. Trivial mitral valve  regurgitation. No evidence of mitral stenosis.   6. The aortic valve is tricuspid. There is mild calcification of the  aortic valve. Aortic valve regurgitation is not visualized. Aortic valve  sclerosis/calcification is present, without any evidence of aortic  stenosis.   7. The inferior vena cava is normal in size with greater than 50%  respiratory variability, suggesting right atrial  pressure of 3 mmHg.   FINDINGS    has a past medical history of Anemia, Asthma, CHF (congestive heart failure) ([place]), Depression, Emphysema of lung ([place]), GERD (gastroesophageal reflux disease), Hemorrhoids, History of kidney stones, Hyperlipidemia, Hypertension, Hypothyroidism, Obesity, Pre-diabetes, Rosacea, and Tubular adenoma of colon (03/26/2019).   reports that she quit smoking about 7 years ago. Her smoking use included cigarettes. She has a 7.50 pack-year smoking history. She has never used smokeless tobacco.  Past Surgical History:  Procedure Laterality Date   ABDOMINAL HYSTERECTOMY     ANTERIOR AND POSTERIOR VAGINAL REPAIR  2020   CARDIAC CATHETERIZATION  11/01/2019   CATARACT EXTRACTION W/ INTRAOCULAR LENS IMPLANT Bilateral 2016   COLONOSCOPY     CYSTOSCOPY     DENTAL SURGERY  2016   Dental Implant   EYE SURGERY Bilateral    cataract   FRACTURE SURGERY Left 2013   Left ankle   INTERCOSTAL NERVE BLOCK Right 12/13/2019   Procedure: INTERCOSTAL NERVE BLOCK;  Surgeon: Melrose Nakayama, MD;  Location: [place];  Service: Thoracic;  Laterality: Right;   LUNG BIOPSY Right 12/13/2019   Procedure: LUNG BIOPSY;  Surgeon: Melrose Nakayama, MD;  Location: [place];  Service: Thoracic;  Laterality: Right;   LYMPH NODE BIOPSY Right 12/13/2019   Procedure: LYMPH NODE BIOPSY;  Surgeon: Melrose Nakayama, MD;  Location: [place];  Service: Thoracic;  Laterality: Right;   medtronix nerve stimulator  07/18/2019   Complete InterStim Sacral Neuromudulation system implantation 07/18/19, [facility]   NASAL SINUS SURGERY     RIGHT/LEFT HEART CATH AND CORONARY ANGIOGRAPHY N/A 11/01/2019   Procedure: RIGHT/LEFT HEART CATH AND CORONARY ANGIOGRAPHY;  Surgeon: Martinique, Peter M, MD;  Location: [place] [facility];  Service: Cardiovascular;  Laterality: N/A;   VAGINECTOMY, PARTIAL     VIDEO BRONCHOSCOPY N/A 12/13/2019   Procedure: VIDEO BRONCHOSCOPY;  Surgeon: Melrose Nakayama, MD;  [facility];   Service: Thoracic;  Laterality: N/A;    Allergies  Allergen Reactions   Ace Inhibitors Cough   Clarithromycin     REACTION: GI sx   Codeine Nausea Only   Sulfamethoxazole Nausea Only   Telmisartan Other (See Comments)    sleepy    Immunization History  Administered Date(s) Administered   Fluad Quad(high Dose 65+) 12/09/2019, 12/11/2020   Hepatitis A, Adult 04/06/2018   Influenza Split 01/06/2009   Influenza, High Dose Seasonal PF 12/17/2014, 12/20/2015, 01/15/2017, 12/18/2017, 11/28/2018   Influenza, Seasonal, Injecte, Preservative Fre 01/07/2014, 12/17/2014   Influenza,inj,Quad PF,6+ Mos 01/15/2017   Influenza,trivalent, recombinat, inj,  PF 12/03/2012   Influenza-Unspecified 01/15/2017   Moderna Covid-19 Vaccine Bivalent Booster 38yr & up 03/18/2021   Moderna SARS-COV2 Booster Vaccination 01/07/2020   Moderna Sars-Covid-2 Vaccination 04/17/2019, 05/15/2019, 08/20/2020   Pneumococcal Conjugate-13 01/07/2014   Pneumococcal Polysaccharide-23 11/23/2011   Td 03/14/2006, 10/16/2011   Zoster Recombinat (Shingrix) 10/21/2017, 12/18/2017   Zoster, Live 12/06/2013    Family History  Problem Relation Age of Onset   Heart disease Mother    Hypertension Mother    Osteoporosis Mother    Stroke Mother    Alcohol abuse Father    Diabetes Maternal Grandmother    Drug abuse Son    Breast cancer Neg Hx      Current Outpatient Medications:    amLODipine (NORVASC) 5 MG tablet, TAKE ONE TABLET BY MOUTH DAILY, Disp: 90 tablet, Rfl: 3   carvedilol (COREG) 25 MG tablet, Take 1 tablet (25 mg total) by mouth 2 (two) times daily., Disp: 180 tablet, Rfl: 3   cetirizine (ZYRTEC) 10 MG tablet, Take 10 mg by mouth daily., Disp: , Rfl:    FARXIGA 10 MG TABS tablet, Take 1 tablet (10 mg total) by mouth daily., Disp: 30 tablet, Rfl: 4   fluticasone (FLONASE) 50 MCG/ACT nasal spray, Place 2 sprays into both nostrils at bedtime as needed (allergies.). , Disp: , Rfl:    levothyroxine (SYNTHROID)  125 MCG tablet, TAKE ONE TABLET BY MOUTH DAILY, Disp: 90 tablet, Rfl: 0   montelukast (SINGULAIR) 10 MG tablet, TAKE ONE TABLET BY MOUTH EVERY NIGHT AT BEDTIME, Disp: 90 tablet, Rfl: 1   Multiple Vitamin (MULTIVITAMIN WITH MINERALS) TABS tablet, Take 1 tablet by mouth daily., Disp: , Rfl:    omeprazole (PRILOSEC) 20 MG capsule, Take 1 capsule (20 mg total) by mouth daily., Disp: 90 capsule, Rfl: 3   ondansetron (ZOFRAN-ODT) 4 MG disintegrating tablet, DISSOLVE ONE TABLET BY MOUTH EVERY 8 HOURS AS NEEDED FOR NAUSEA/ VOMITING, Disp: 20 tablet, Rfl: 0   Pirfenidone (ESBRIET) 801 MG TABS, Take 801 mg by mouth with breakfast, with lunch, and with evening meal., Disp: 90 tablet, Rfl: 11   predniSONE (DELTASONE) 10 MG tablet, Take 4 tablets (40 mg total) by mouth daily with breakfast for 3 days, THEN 3 tablets (30 mg total) daily with breakfast for 3 days, THEN 2 tablets (20 mg total) daily with breakfast for 3 days, THEN 1 tablet (10 mg total) daily with breakfast for 3 days., Disp: 30 tablet, Rfl: 0   rosuvastatin (CRESTOR) 10 MG tablet, TAKE ONE TABLET BY MOUTH DAILY, Disp: 90 tablet, Rfl: 3   spironolactone (ALDACTONE) 25 MG tablet, Take 1 tablet (25 mg total) by mouth daily., Disp: 60 tablet, Rfl: 1   valsartan (DIOVAN) 320 MG tablet, Take 1 tablet (320 mg total) by mouth daily., Disp: 90 tablet, Rfl: 3   venlafaxine XR (EFFEXOR-XR) 150 MG 24 hr capsule, Take 1 capsule (150 mg total) by mouth daily., Disp: 90 capsule, Rfl: 3      Objective:   Vitals:   08/04/21 1456  BP: 128/80  Pulse: 69  SpO2: 98%  Weight: 197 lb (89.4 kg)  Height: _0  (1.702 m)    Estimated body mass index is 30.85 kg/m as calculated from the following:   Height as of this encounter: _1  (1.702 m).   Weight as of this encounter: 197 lb (89.4 kg).  _2 @  Filed Weights   08/04/21 1456  Weight: 197 lb (89.4 kg)     Physical ExamGeneral: No distress.obese. Looks  well Neuro: Alert and Oriented x 3. GCS  15. Speech normal Psych: Pleasant Resp:  Barrel Chest - no.  Wheeze - no, Crackles - faint, No overt respiratory distress CVS: Normal heart sounds. Murmurs - no Ext: Stigmata of Connective Tissue Disease - no HEENT: Normal upper airway. PEERL +. No post nasal drip        Assessment:       ICD-10-CM   1. IPF (idiopathic pulmonary fibrosis) (HCC)  N63.943 Pulmonary function test    2. ILD (interstitial lung disease) ([place])  J84.9     3. Chronic cough  R05.3     4. Dyspnea on exertion  R06.09     5. Dizziness  R42          Plan:     Patient Instructions     ICD-10-CM   1. IPF (idiopathic pulmonary fibrosis) ([place])  J84.112     2. ILD (interstitial lung disease) ([place])  J84.9     3. Chronic cough  R05.3     4. Dyspnea on exertion  R06.09     5. Dizziness  R42       No evidence so far that fibrosis dramatically worse this year  Yet having lot of symptoms esp cough Wondering if feather pillow and sofa making thing worse Versys GERD or both The feather exposure makes me wonder if you have antoher ILD called HP   No sure what exertional dizziness is about - > ? Related to heart meds or heart condition itslfe  Plan Regarding cough and ILD - add otc cimetidine at night - get rid of all feathr related furniture  - Please take Take prednisone 71m once daily x 3 days, then 327monce daily x 3 days, then 2089mnce daily x 3 days, then prednisone 70m42mce daily  x 3 days and stop - let me see if  we can get your pathology (lung biopsy) read elsewhere sometime next few months  Regarding dizziness - let us s[place]orea if barostim helps your dizzineess - will write to your cardiologist  Followup  - 4 weeks do spirometry and dlco and return for 30 min vsiit    PS: alerted Dr AchaMargaretann Loveless Dr BrabTrula Slademplex conditonwith requirement of intensive therapeutic monitoring  SIGNATURE    Dr. MuraBrand MalesD., F.C.C.P,  Pulmonary and Critical Care Medicine Staff  Physician, [facility] - Interstitial Lung Disease  Program  Pulmonary [place], [id number]: [phone] no answer or between  15:00h - 7:00h: call [phone] Telephone: [phone]  10:46 PM 08/04/2021

## 2021-08-06 ENCOUNTER — Ambulatory Visit: Payer: Medicare Other | Admitting: Internal Medicine

## 2021-08-06 ENCOUNTER — Encounter: Payer: Self-pay | Admitting: Internal Medicine

## 2021-08-06 VITALS — BP 120/72 | HR 77 | Ht 67.0 in | Wt 199.2 lb

## 2021-08-06 DIAGNOSIS — I5020 Unspecified systolic (congestive) heart failure: Secondary | ICD-10-CM

## 2021-08-06 NOTE — Patient Instructions (Addendum)
Medication Instructions:  No Changes In Medications at this time.  *If you need a refill on your cardiac medications before your next appointment, please call your pharmacy*  Testing/Procedures: Your physician has recommended that you have a cardiopulmonary stress test (CPX). CPX testing is a non-invasive measurement of heart and lung function. It replaces a traditional treadmill stress test. This type of test provides a tremendous amount of information that relates not only to your present condition but also for future outcomes. This test combines measurements of you ventilation, respiratory gas exchange in the lungs, electrocardiogram (EKG), blood pressure and physical response before, during, and following an exercise protocol.  Follow-Up: At [facility], you and your health needs are our priority.  As part of our continuing mission to provide you with exceptional heart care, we have created designated Provider Care Teams.  These Care Teams include your primary Cardiologist (physician) and Advanced Practice Providers (APPs -  Physician Assistants and Nurse Practitioners) who all work together to provide you with the care you need, when you need it.  SOMEONE WILL REACH OUT TO GET YOU SCHEDULED FOR FOLLOW UP WITH DR. Jeffie Pollock AFTER THE CARDIOPULMONARY STRESS TEST   Your next appointment:   AROUND September With   The format for your next appointment:   In Person  Provider:   Elouise Munroe, MD

## 2021-08-06 NOTE — Progress Notes (Unsigned)
Cardiology Office Note:    Date:  08/06/2021   ID:  Susan Hunt, DOB [birth year], MRN [id number]  PCP:  Susan Arnt, MD  Cardiologist:  Elouise Munroe, MD  Electrophysiologist:  None   Referring MD: Susan Arnt, MD   Chief Complaint/Reason for Referral: ***  History of Present Illness:    Susan Hunt is a 76 y.o. female with a history of ***  Past Medical History:  Diagnosis Date   Anemia    Asthma    "I dont think I have it.   CHF (congestive heart failure) (HCC)    Depression    Emphysema of lung (HCC)    GERD (gastroesophageal reflux disease)    Hemorrhoids    History of kidney stones    Cystoscopy   Hyperlipidemia    Hypertension    Hypothyroidism    Obesity    Pre-diabetes    Rosacea    Tubular adenoma of colon 03/26/2019   Colonoscopy repeat in 3 years    Past Surgical History:  Procedure Laterality Date   ABDOMINAL HYSTERECTOMY     ANTERIOR AND POSTERIOR VAGINAL REPAIR  2020   CARDIAC CATHETERIZATION  11/01/2019   CATARACT EXTRACTION W/ INTRAOCULAR LENS IMPLANT Bilateral 2016   COLONOSCOPY     CYSTOSCOPY     DENTAL SURGERY  2016   Dental Implant   EYE SURGERY Bilateral    cataract   FRACTURE SURGERY Left 2013   Left ankle   INTERCOSTAL NERVE BLOCK Right 12/13/2019   Procedure: INTERCOSTAL NERVE BLOCK;  Surgeon: Melrose Nakayama, MD;  Location: [place];  Service: Thoracic;  Laterality: Right;   LUNG BIOPSY Right 12/13/2019   Procedure: LUNG BIOPSY;  Surgeon: Melrose Nakayama, MD;  Location: [place];  Service: Thoracic;  Laterality: Right;   LYMPH NODE BIOPSY Right 12/13/2019   Procedure: LYMPH NODE BIOPSY;  Surgeon: Melrose Nakayama, MD;  Location: [place];  Service: Thoracic;  Laterality: Right;   medtronix nerve stimulator  07/18/2019   Complete InterStim Sacral Neuromudulation system implantation 07/18/19, [facility]   NASAL SINUS SURGERY     RIGHT/LEFT HEART CATH AND CORONARY ANGIOGRAPHY N/A 11/01/2019   Procedure:  RIGHT/LEFT HEART CATH AND CORONARY ANGIOGRAPHY;  Surgeon: Martinique, Peter M, MD;  Location: [place] [facility];  Service: Cardiovascular;  Laterality: N/A;   VAGINECTOMY, PARTIAL     VIDEO BRONCHOSCOPY N/A 12/13/2019   Procedure: VIDEO BRONCHOSCOPY;  Surgeon: Melrose Nakayama, MD;  [facility];  Service: Thoracic;  Laterality: N/A;    Current Medications: Current Meds  Medication Sig   amLODipine (NORVASC) 5 MG tablet TAKE ONE TABLET BY MOUTH DAILY   carvedilol (COREG) 25 MG tablet Take 1 tablet (25 mg total) by mouth 2 (two) times daily.   cetirizine (ZYRTEC) 10 MG tablet Take 10 mg by mouth daily.   FARXIGA 10 MG TABS tablet Take 1 tablet (10 mg total) by mouth daily.   levothyroxine (SYNTHROID) 125 MCG tablet TAKE ONE TABLET BY MOUTH DAILY   montelukast (SINGULAIR) 10 MG tablet TAKE ONE TABLET BY MOUTH EVERY NIGHT AT BEDTIME   Multiple Vitamin (MULTIVITAMIN WITH MINERALS) TABS tablet Take 1 tablet by mouth daily.   omeprazole (PRILOSEC) 20 MG capsule Take 1 capsule (20 mg total) by mouth daily.   ondansetron (ZOFRAN-ODT) 4 MG disintegrating tablet DISSOLVE ONE TABLET BY MOUTH EVERY 8 HOURS AS NEEDED FOR NAUSEA/ VOMITING   Pirfenidone (ESBRIET) 801 MG TABS Take 801 mg by mouth with  breakfast, with lunch, and with evening meal.   predniSONE (DELTASONE) 10 MG tablet Take 4 tablets (40 mg total) by mouth daily with breakfast for 3 days, THEN 3 tablets (30 mg total) daily with breakfast for 3 days, THEN 2 tablets (20 mg total) daily with breakfast for 3 days, THEN 1 tablet (10 mg total) daily with breakfast for 3 days.   rosuvastatin (CRESTOR) 10 MG tablet TAKE ONE TABLET BY MOUTH DAILY   spironolactone (ALDACTONE) 25 MG tablet Take 1 tablet (25 mg total) by mouth daily.   valsartan (DIOVAN) 320 MG tablet Take 1 tablet (320 mg total) by mouth daily.   venlafaxine XR (EFFEXOR-XR) 150 MG 24 hr capsule Take 1 capsule (150 mg total) by mouth daily.     Allergies:   Ace inhibitors,  Clarithromycin, Codeine, Sulfamethoxazole, and Telmisartan   Social History   Tobacco Use   Smoking status: Former    Packs/day: 0.50    Years: 15.00    Pack years: 7.50    Types: Cigarettes    Quit date: 08/18/2013    Years since quitting: 7.9   Smokeless tobacco: Never  Vaping Use   Vaping Use: Never used  Substance Use Topics   Alcohol use: Not Currently    Alcohol/week: 1.0 standard drink    Types: 1 Glasses of wine per week    Comment: 2-3 glasses of wine weekly   Drug use: No     Family History: The patient's family history includes Alcohol abuse in her father; Diabetes in her maternal grandmother; Drug abuse in her son; Heart disease in her mother; Hypertension in her mother; Osteoporosis in her mother; Stroke in her mother. There is no history of Breast cancer.  ROS:   Please see the history of present illness.    All other systems reviewed and are negative.  EKGs/Labs/Other Studies Reviewed:    The following studies were reviewed today:  EKG:  NSR, ST T wave abnl anterior.  Imaging studies that I have independently reviewed today: ***  Recent Labs: 12/11/2020: TSH 0.73 07/22/2021: ALT 15; BUN 18; Creatinine, Ser 1.06; Hemoglobin 15.2; Platelets 239.0; Potassium 4.1; Pro B Natriuretic peptide (BNP) 51.0; Sodium 141  Recent Lipid Panel    Component Value Date/Time   CHOL 159 12/11/2020 1208   TRIG 163 (H) 12/11/2020 1208   HDL 52 12/11/2020 1208   CHOLHDL 3.1 12/11/2020 1208   VLDL 28.4 07/19/2019 1333   LDLCALC 81 12/11/2020 1208   LDLDIRECT 146.7 05/29/2006 1115    Physical Exam:    VS:  BP 120/72 (BP Location: Left Arm, Patient Position: Sitting, Cuff Size: Normal)   Pulse 77   Ht _0  (1.702 m)   Wt 199 lb 3.2 oz (90.4 kg)   BMI 31.20 kg/m     Wt Readings from Last 5 Encounters:  08/06/21 199 lb 3.2 oz (90.4 kg)  08/04/21 197 lb (89.4 kg)  08/02/21 198 lb (89.8 kg)  07/22/21 199 lb (90.3 kg)  07/20/21 198 lb 12.8 oz (90.2 kg)     Constitutional: No acute distress Eyes: sclera non-icteric, normal conjunctiva and lids ENMT: normal dentition, moist mucous membranes Cardiovascular: regular rhythm, normal rate, no murmur. S1 and S2 normal. No jugular venous distention.  Respiratory: clear to auscultation bilaterally GI : normal bowel sounds, soft and nontender. No distention.   MSK: extremities warm, well perfused. No edema.  NEURO: grossly nonfocal exam, moves all extremities. PSYCH: alert and oriented x 3, normal mood and affect.  ASSESSMENT:    1. HFrEF (heart failure with reduced ejection fraction) (HCC)    PLAN:    HFrEF (heart failure with reduced ejection fraction) ([place]) - Plan: Cardiopulmonary exercise test, EKG 12-Lead  Total time of encounter: *** minutes total time of encounter, including *** minutes spent in face-to-face patient care on the date of this encounter. This time includes coordination of care and counseling regarding above mentioned problem list. Remainder of non-face-to-face time involved reviewing chart documents/testing relevant to the patient encounter and documentation in the medical record. I have independently reviewed documentation from referring provider.   Cherlynn Kaiser, MD, [place]   Shared Decision Making/Informed Consent:   {Are you ordering a CV Procedure (e.g. stress test, cath, DCCV, TEE, etc)?   Press F2        :[id number]}   Medication Adjustments/Labs and Tests Ordered: Current medicines are reviewed at length with the patient today.  Concerns regarding medicines are outlined above.   Orders Placed This Encounter  Procedures   Cardiopulmonary exercise test   EKG 12-Lead    No orders of the defined types were placed in this encounter.   Patient Instructions  Medication Instructions:  No Changes In Medications at this time.  *If you need a refill on your cardiac medications before your next appointment, please call your  pharmacy*  Testing/Procedures: Your physician has recommended that you have a cardiopulmonary stress test (CPX). CPX testing is a non-invasive measurement of heart and lung function. It replaces a traditional treadmill stress test. This type of test provides a tremendous amount of information that relates not only to your present condition but also for future outcomes. This test combines measurements of you ventilation, respiratory gas exchange in the lungs, electrocardiogram (EKG), blood pressure and physical response before, during, and following an exercise protocol.  Follow-Up: At [facility], you and your health needs are our priority.  As part of our continuing mission to provide you with exceptional heart care, we have created designated Provider Care Teams.  These Care Teams include your primary Cardiologist (physician) and Advanced Practice Providers (APPs -  Physician Assistants and Nurse Practitioners) who all work together to provide you with the care you need, when you need it.  SOMEONE WILL REACH OUT TO GET YOU SCHEDULED FOR FOLLOW UP WITH DR. Jeffie Pollock AFTER THE CARDIOPULMONARY STRESS TEST   Your next appointment:   AROUND September With   The format for your next appointment:   In Person  Provider:   Elouise Munroe, MD

## 2021-08-11 ENCOUNTER — Telehealth (HOSPITAL_COMMUNITY): Payer: Self-pay | Admitting: Vascular Surgery

## 2021-08-11 ENCOUNTER — Encounter: Payer: Medicare Other | Admitting: Internal Medicine

## 2021-08-11 NOTE — Telephone Encounter (Signed)
Lvm to make CPX APPT and new appt w/ DB late July

## 2021-08-13 ENCOUNTER — Encounter: Payer: Self-pay | Admitting: Internal Medicine

## 2021-08-13 ENCOUNTER — Ambulatory Visit: Payer: Medicare Other | Admitting: Family Medicine

## 2021-08-13 ENCOUNTER — Telehealth: Payer: Self-pay | Admitting: Internal Medicine

## 2021-08-13 NOTE — Telephone Encounter (Signed)
PFT is scheduled on 09/02/21 and appt is scheduled with MR on 10/25/21 to follow up.   MR please advise if we need to reschedule to where she can have the PFT and then see you after.  Thanks

## 2021-08-13 NOTE — Telephone Encounter (Signed)
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 He is surpised blinded pathologist for a study rejected this slide for UIP. No granuloma. No altnerative diagnosis  Thanks    SIGNATURE    Dr. Brand Males, M.D., F.C.C.P,  Pulmonary and Critical Care Medicine Staff Physician, [place] Director - Interstitial Lung Disease  Program  Medical Director - [place] [facility] [place] at [place], [place], [address]  NPI Number:  NPI #[id number] [place] Number: [id number]  Pager: [phone], If no answer  -[place] or [place] [phone] Telephone (clinical office): [phone] Telephone (research): [phone]  2:15 PM 08/13/2021

## 2021-08-15 NOTE — Telephone Encounter (Signed)
LEt patient know the following  1) I discussed her path again and saw the lung biopsy slide myself with pathologist - our pathologist still feels it is c/w IPF  2) d/w Dr Margaretann Loveless over phone - she said patient Susan Hunt  did not have dizziness when DR Margaretann Loveless saw her. She is getting  CPST test - patient aware and on schedule  3)Right now  - 6.20/.23  - cPST test - keep this 09/01/21 - [facility] research visit - keep this but might postpoine/cancel based on CPST 09/02/21  - standard of care PFT - keep this but might postpoone/cancel based on above 10/25/21 - standard of care OV with Susan Hunt - keept this but might postpoine/cancel based on above  Copying Lauren of [facility] so they are tracking

## 2021-08-25 ENCOUNTER — Encounter: Payer: Self-pay | Admitting: Internal Medicine

## 2021-08-26 ENCOUNTER — Ambulatory Visit (HOSPITAL_COMMUNITY): Payer: Medicare Other | Attending: Internal Medicine

## 2021-08-26 DIAGNOSIS — I5020 Unspecified systolic (congestive) heart failure: Secondary | ICD-10-CM | POA: Diagnosis not present

## 2021-08-31 ENCOUNTER — Encounter (HOSPITAL_COMMUNITY): Payer: Medicare Other

## 2021-09-01 ENCOUNTER — Telehealth: Payer: Self-pay | Admitting: Internal Medicine

## 2021-09-01 ENCOUNTER — Encounter: Payer: Medicare Other | Admitting: *Deleted

## 2021-09-01 DIAGNOSIS — Z006 Encounter for examination for normal comparison and control in clinical research program: Secondary | ICD-10-CM

## 2021-09-01 DIAGNOSIS — J84112 Idiopathic pulmonary fibrosis: Secondary | ICD-10-CM

## 2021-09-01 MED ORDER — BENZONATATE 200 MG PO CAPS: 200.0000 mg | ORAL_CAPSULE | Freq: Three times a day (TID) | ORAL | 1 refills | Status: DC | PRN

## 2021-09-01 NOTE — Telephone Encounter (Signed)
Called and spoke with pt letting her know the info per MR and that Rx for tessalon was sent in for her and she verbalized understanding. Nothing further needed.

## 2021-09-01 NOTE — Research (Signed)
TITLE: A Phase 2, Randomized, Double-Blind, Placebo-Controlled Study to Evaluate the Safety and Efficacy of [id number] in Patients With Idiopathic Pulmonary Fibrosis  Protocol #: [id number] NCT: [employer]  Protocol Version: version 8: approved 20JUL2022 IB:  version 7.0: approved  ICF: Main: V2.0 approved 08Apr2023, modified [id number]  Pregnancy: V2.0 approved 25Jan2023  Study Design:  This is a randomized, double-blinded, placebo-controlled multicenter study to evaluate the safety and efficacy of [id number] in patients with IPF with or without standard-of-care. 2:1 randomization ratio to [id number] 150 mg BID or the matching placebo for  24 weeks.    Mechanism of Action Proline is one of the largest constituents of collagen. In IPF, there is excessive deposition of collagen. Prolyl-tRNA Synthetase (PRS) is an enzyme that conjugates proline. [id number] (Bersiporocin) is the world's first selective PRS inhibitor that decreases collagen formation and subsequent pro-fibrotic markers.  Administration  A dose of [id number] 150 mg BID will be administered orally in the fasted state or at least 2 hours after the last meal, for 24 weeks.   Key Inclusion Criteria age = 38 years Documented diagnosis of IPF per the 2018 ATS/ERS/JRS/ALAT Clinical Practice Meeting all of the following criteria during the screening period:             FVC ? 40% predicted            DLCOcor ?25% to ? 80%              (FEV1)/FVC ratio ? 0.7             Able to walk at least 150 m in 6MWT, resting SpO2 should be ? 88% with a maximum of 6L O2/min  On a stable dose of pirfenidone OR nintedanib for at least 3 months OR on neither pirfenidone nor nintedanib.  Key Exclusion Criteria Currently taking medication known as a strong CYP2D6 inhibitor OR taking medication known to be CYP2D6 inducers OR CYP2D6 substrate with narrow therapeutic index. GFR  < 30 mL/min/1.85m moderate to severe hepatic  impairment (Child-Pugh B and C). Patients with =3upper limit of normal of alanine aminotransferase, aspartate aminotransferase or gamma-glutamyl transpeptidase. Abnormal ECG findings including but not limited to QTc >500 ms.  Pharmacokinetics Urine PK data indicate that renal elimination is not the major clearance pathway for [id number]   Adverse effects and risk Overall, when [id number] administered concomitantly with Pirfenidone and Nintedanib, [id number] mg enteric-coated tablet was generally well tolerated and safe.   Safety data from edition number [id number] abstracted in March 2023.   Gastrointestinal adverse reactions (e.g Diarrhea, abdominal pain, nausea, vomiting) followed by CNS (headache and dizziness) as below were the most commonly observed adverse reactions.  Most adverse reactions were mild or moderate and reversible.  An improved enteric-coated 150 mg tablet (new) was reformulated to lower the initial dissolution rate in pH 6.8. Enteric-coated tablets (new) demonstrated that nausea and vomiting appear to have decreased. While the incidence rate of diarrhea was slightly increased than old formulation administration, the severity was all mild.  Below table comprises predominantly of enteric coated tablet.  Overall adverse event % n=229 Average of 4 different studies [id number]=24(part 1) Esbriet combination [id number]=24(part 2) Ofev combination   Nausea 54 out of 229 23.58% 6(25%) 2(8.33%)  Vomiting 37 out of 205 18.04% 1(4.17%) 1(4.17%)  Diarrhea 37 out of 229 16.15% 2(8.33%) 7(29.17%)  Abdominal pain 7 out of 229 3.05% 0 1(4.17%)  Constipation 4 out of 54  7.4% - -  Abdominal discomfort  4 out of 205  1.95% 0 1(4.17%)  Headache 25 out of 205  12.19% 4(16.67%) 1(4.17%)  Dizziness 8 out of 175 (4.57%) 3(12.50%) 1(4.17%)   Severity of TEAE Table  Overall adverse event % n=229 Average of 4 different studies [id number] n=24(part 1) Esbriet  combination [id number] n=24(part 2) Ofev combination   TEAE - occurrences  382 19 18  Severity- mild 351 (91.88%) 18 (94.73%) 17 (94.44%)   Moderate 31 (8.11%) 1 (5.26%) 1 (5.55%)  Severe 0 (0%) 0 (0%)  0 (0%)     91% TEAEs were mild  (96 of 106 TEAEs, including all 16 TEAEs in the placebo group). 9% (n=10) were moderate.   TEAEs occurred in a dose dependent manner with 7 of the 10 occurring in daily doses >= 639m  All moderate TEAEs were classed as Gastrointestinal Disorders (nausea, vomiting, diarrhea, and epigastric discomfort), were considered recovered/resolved within 24 hours, and had no sequelae. There were no severe, life-threatening or fatal TEAEs across the study. There were no subjects with serious TEAEs, and there were no subjects with TEAEs leading to IP discontinuation during Part 1 (SAD) of the study.  EKG concerns There were no effects on ECG with dose up to 80 mg/kg in cynomolgus monkeys. In humans no EKG abnormalities reported  except in Part 2 (([id number] concomitantly with Nintedanib), one case of PR prolongation LFT concerns In rats, reversible, minimal DWN12088HCl-related centrilobular hepatocyte hypertrophy was noted in the liver administered ?50 mg/kg/day and was considered consistent with DWN12088HCl-related induction of hepatocellular enzymes.  In humans, on investigation in the MAD study of six subjects, hepatic enzyme was increased in one participant (16.7%).   Rare concerns based on animal data - not seen in humans Excessive salivation in rats  at doses >50 mg/kg/day and mildly diminished appetite in 1 monkey was observed on one occasion.  With extremely high doses of 1200 mg/kg decreased activity, increased/labored/shallow respiration, gasping, vocalizing and piloerection was observed in female (but not female) rats 6h post dose.[facility] @ CDry CreekCoordinator note :   This visit for Subject Susan BOLLENwith DOB:  [birth year]on 09/01/2021 for the above protocol is Visit/Encounter # Screening  and is for purpose of research.    Ms. BFolandexpressed continued interest and consent in continuing as a study subject. Subject confirmed that there was no change in contact information (e.g. address, telephone, email). Subject thanked for participation in research and contribution to science.  In this visit 09/01/2021 the subject will be evaluated by Principal Investigator named Dr. MBrand Males This research coordinator has verified that the above investigator is up to date with his/her training logs.   This visit is a key visit of Screening.  The PI is is available for this visit.     Signed by  LJaye BeagleRN, MSN/MBA  Clinical Research Coordinator / Nurse [facility]  [place] N[place]1:42 PM 09/01/2021

## 2021-09-01 NOTE — Telephone Encounter (Addendum)
Cough related note on patinent Susan Hunt  - Oct 23, 1945    - Reports cough for 4 months now. Review of chart indicates since feb 2023. In early march 2023 she had covid diagnosis. She feels cough is there since then. IT is greater at night. But throughout day. Prednisone has helped in past. Today, at a research visit she indicated that cough level is 3-4 of 5 in severity. Dry cough. Could not dlco. REmoved all feather pillows and sofas but has not helped cough. Time sequence wise it - FVC 2L - 2.036  She did CPST last week - Vo2 40m with RER 0.95. Corrected for weigh it is > 15    PLAN - EMily please execute  - pleaes call in tessaolon cough pereles 2055mtid x 30 days x 1 refill - also can do OTC Robitussin WIHTOUT Dextromorphan  - please advise - above are standard of care instruction  - Lauren from [facility] will call separeately with reseach instruction      Latest Ref Rng & Units 07/22/2021    3:27 PM 04/29/2021    2:58 PM 08/23/2019   10:53 AM  PFT Results  FVC-Pre L 2.05  2.13  2.61   FVC-Predicted Pre % 65  67  80   FVC-Post L   2.57   FVC-Predicted Post %   79   Pre FEV1/FVC % % 86  88  87   Post FEV1/FCV % %   92   FEV1-Pre L 1.77  1.87  2.26   FEV1-Predicted Pre % 74  79  92   FEV1-Post L   2.36   DLCO uncorrected ml/min/mmHg 12.27  12.28  13.84   DLCO UNC% % 58  58  65   DLCO corrected ml/min/mmHg 12.27  12.28  13.76   DLCO COR %Predicted % 58  58  64   DLVA Predicted % 97  96  88   TLC L   4.15   TLC % Predicted %   75   RV % Predicted %   63

## 2021-09-01 NOTE — Addendum Note (Signed)
Addended by: Lorretta Harp on: 09/01/2021 03:46 PM   Modules accepted: Orders

## 2021-09-02 ENCOUNTER — Telehealth: Payer: Self-pay | Admitting: Internal Medicine

## 2021-09-02 MED ORDER — PREDNISONE 10 MG PO TABS
ORAL_TABLET | ORAL | 0 refills | Status: AC
Start: 2021-09-02 — End: 2021-09-07

## 2021-09-02 NOTE — Telephone Encounter (Signed)
Rx for prednisone has been sent to pharmacy for pt. Called and spoke with pt letting her know this had been done and she verbalized understanding. Nothing further needed.

## 2021-09-02 NOTE — Telephone Encounter (Signed)
   Please also call in 5d prednisone for the cough  Please take prednisone 40 mg x1 day, then 30 mg x1 day, then 20 mg x1 day, then 10 mg x1 day, and then 5 mg x1 day and stop

## 2021-09-08 NOTE — Telephone Encounter (Signed)
Per [facility] patient was called to get set up with her cpap and she refused to get her cpap setup stating she did not want it.

## 2021-09-13 IMAGING — MG MM DIGITAL DIAGNOSTIC UNILAT*L* W/ TOMO W/ CAD
4 series · 4 of 12 positions shown · non-contrast
Comparison: Previous exam(s).

CLINICAL DATA: 73-year-old female for further evaluation of
possible LEFT breast mass on screening mammogram.

EXAM:
DIGITAL DIAGNOSTIC LEFT MAMMOGRAM WITH CAD AND TOMO
ULTRASOUND LEFT BREAST

[L MLO synth-2D]
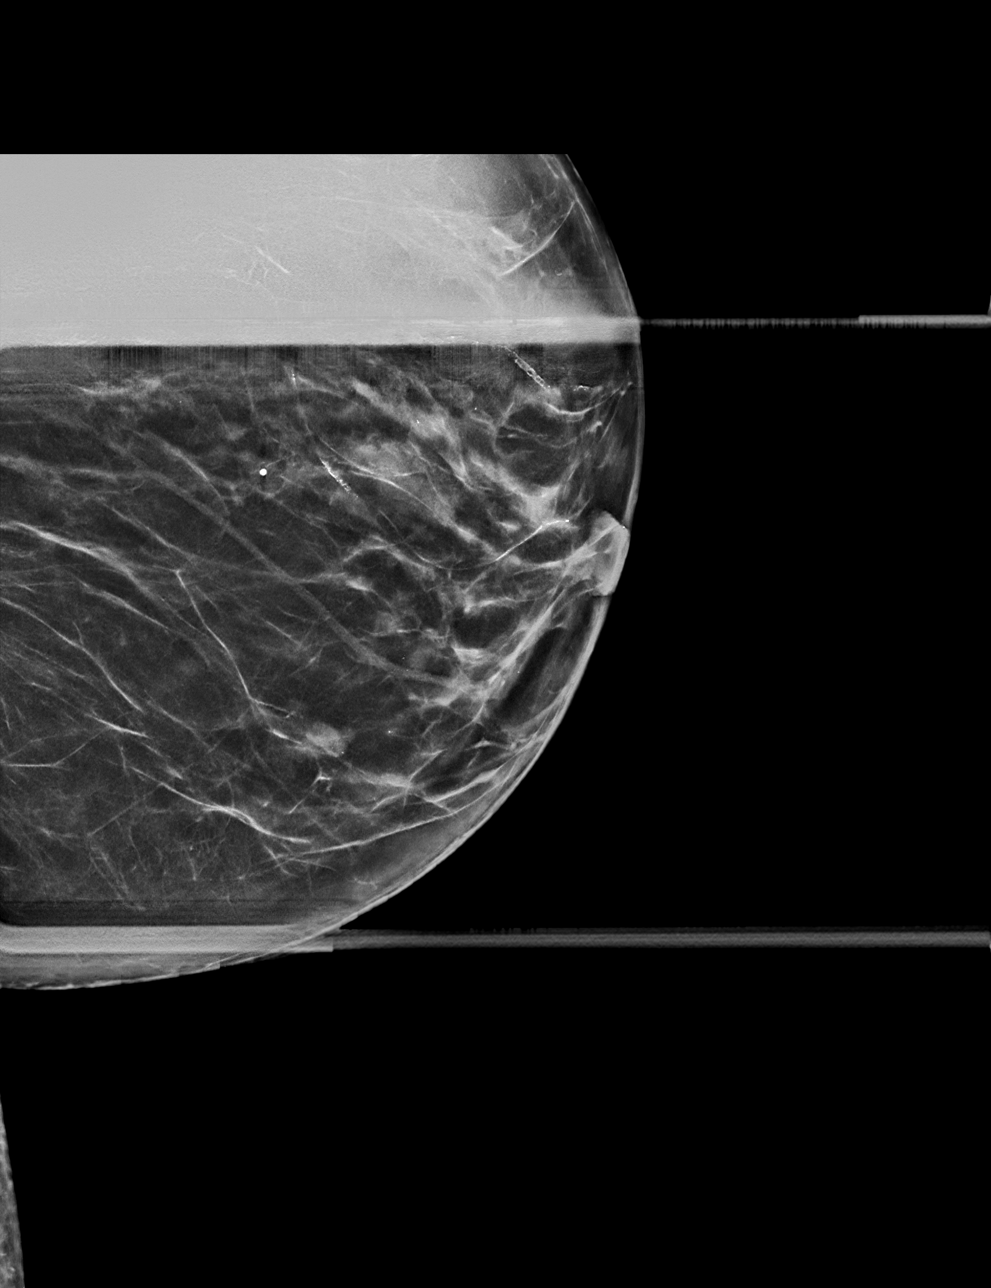

[L CC synth-2D]
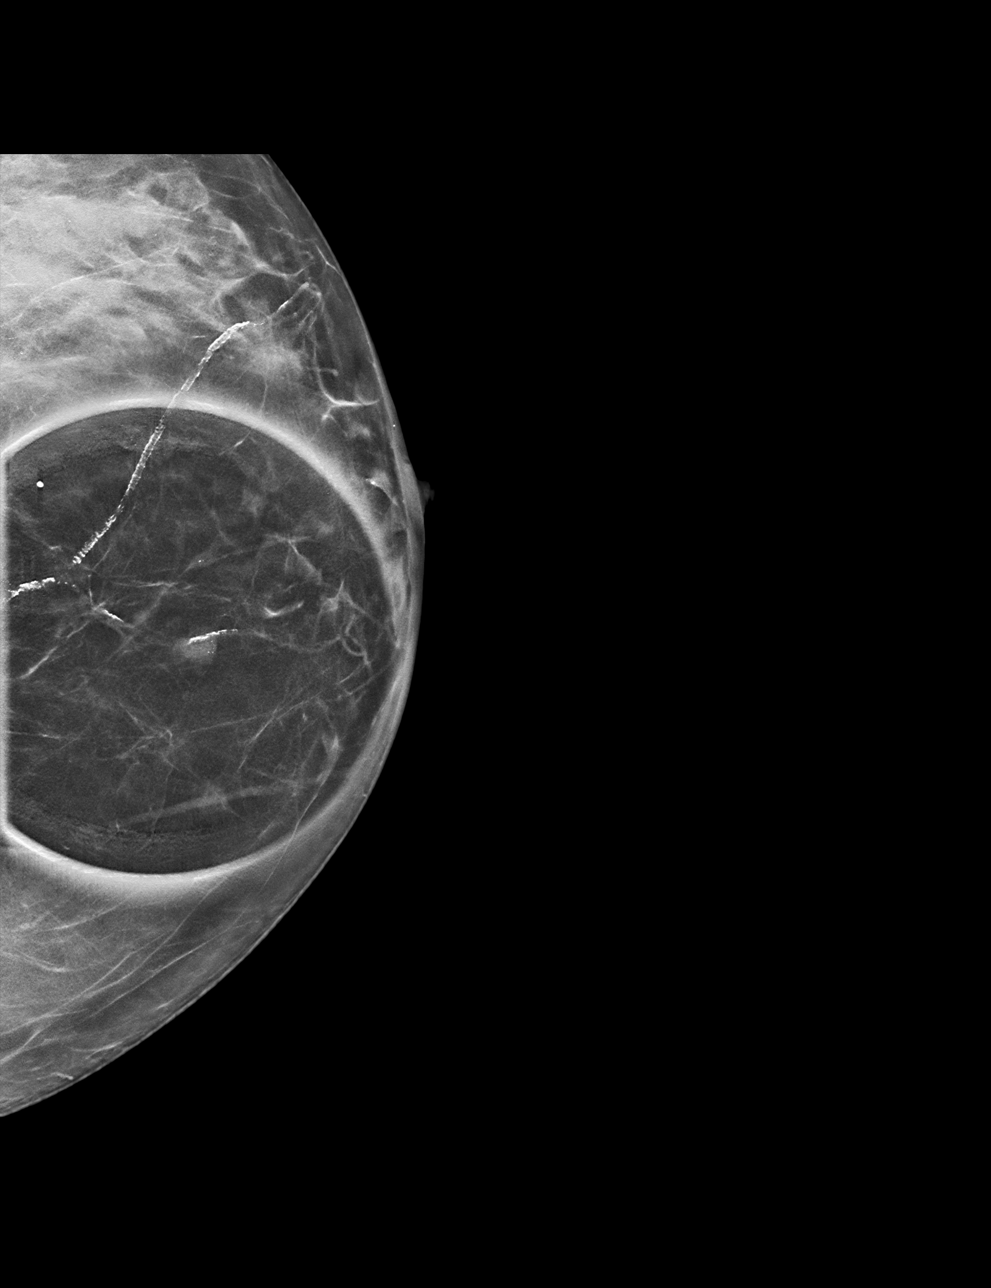

[L MLO tomo · tomo slice 33/64.0]
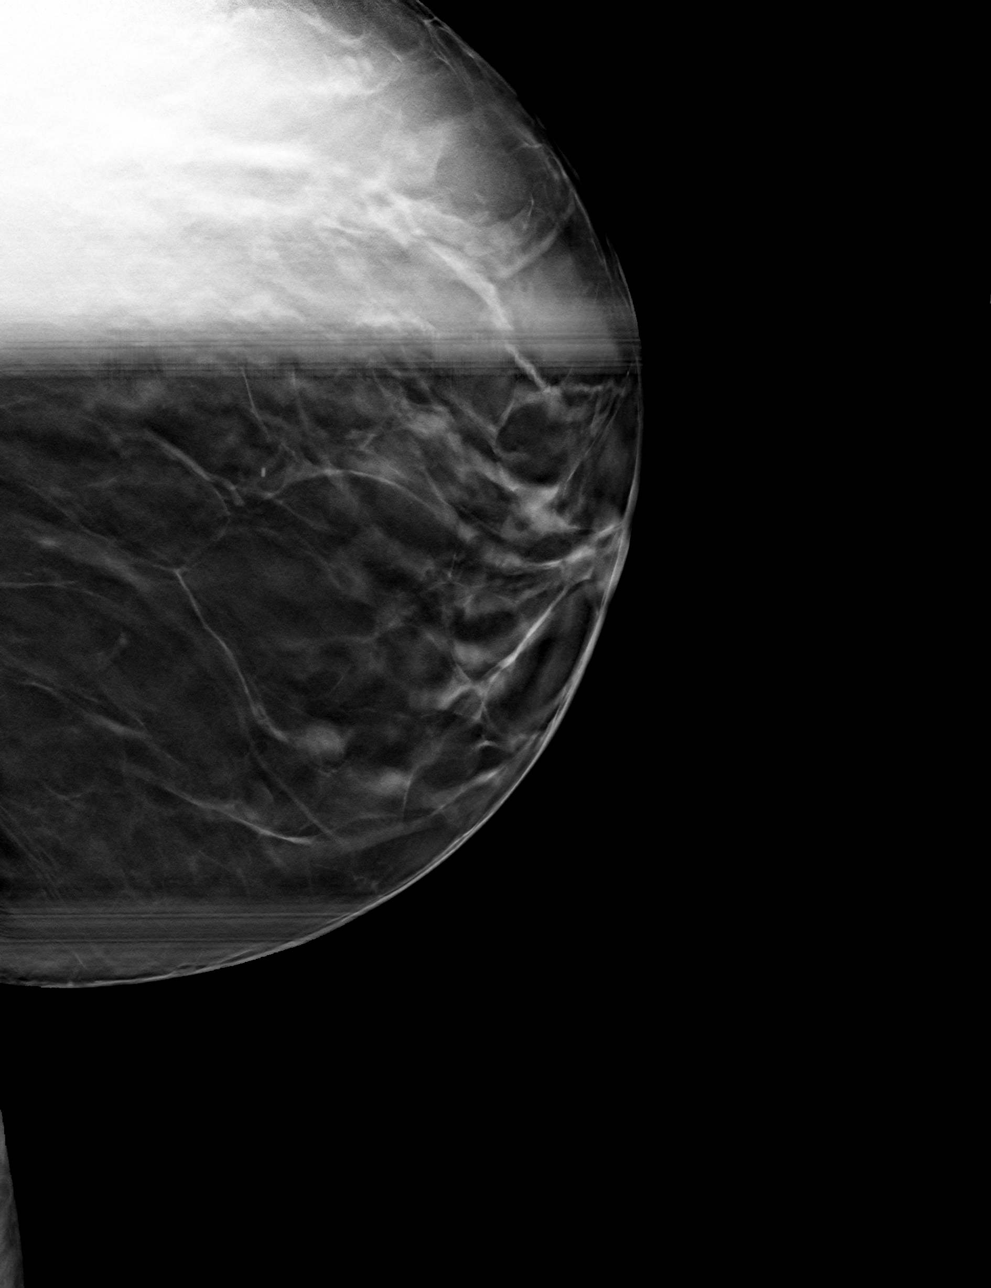

[L CC tomo · tomo slice 25/50.0]
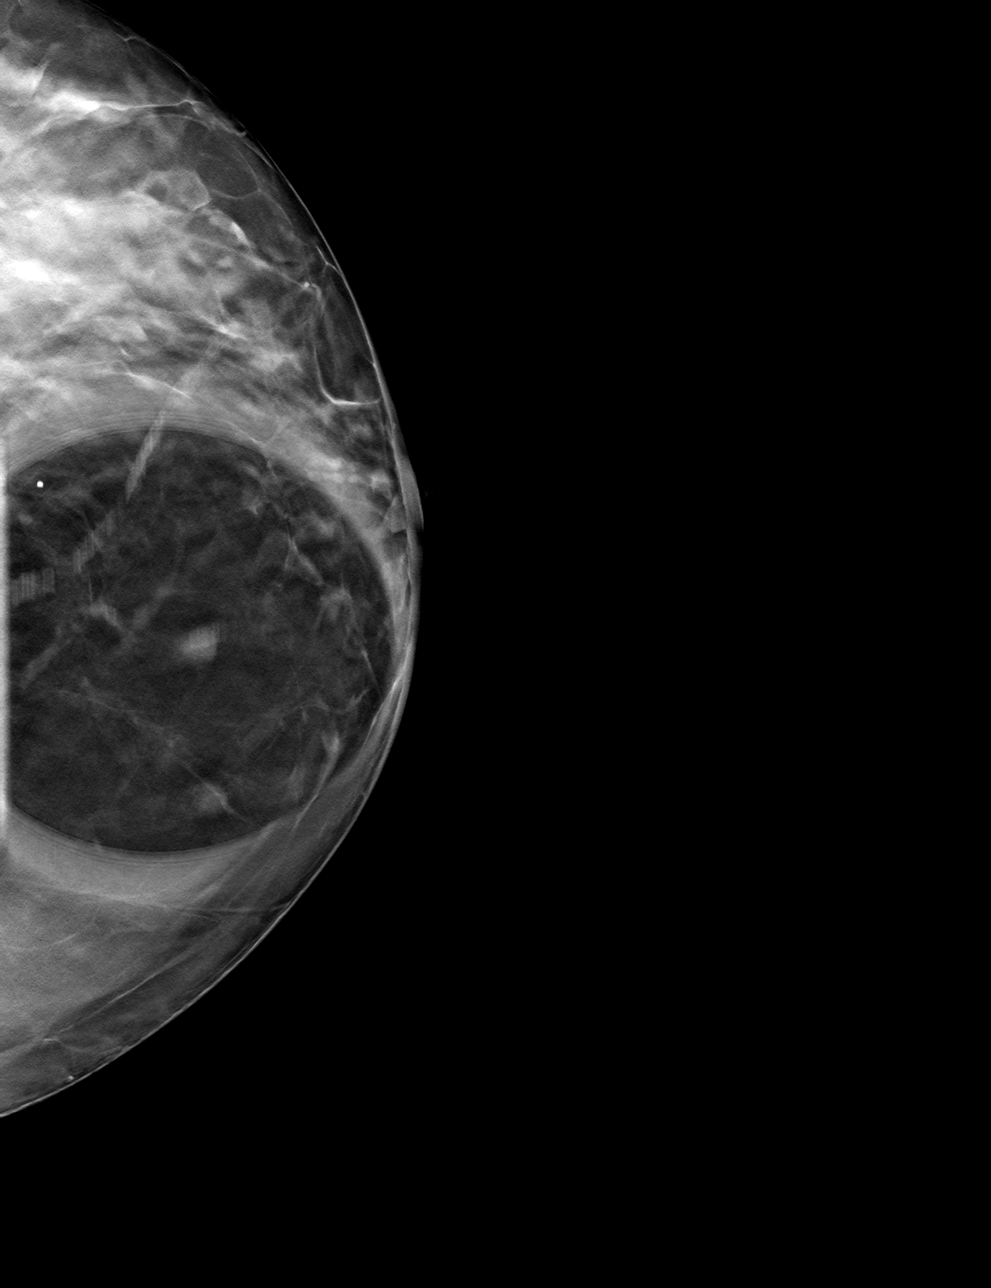

[4 of 12 positions shown; findings below may reference images not displayed]

ACR Breast Density Category b: There are scattered areas of
fibroglandular density.
FINDINGS: 2D/3D spot compression views of the LEFT breast demonstrate a
persistent circumscribed oval mass within the LOWER INNER LEFT
breast.

Targeted ultrasound is performed, showing a 0.7 x 0.5 x 0.7 cm
benign complicated cyst containing calcifications at the 7 o'clock
position of the LEFT breast 3 cm from the nipple, corresponding to
the screening study finding.
IMPRESSION: Benign cyst within the LOWER INNER LEFT breast corresponding to the
screening study finding.

RECOMMENDATION:
Bilateral screening mammogram in 1 year.

I have discussed the findings and recommendations with the patient.
If applicable, a reminder letter will be sent to the patient
regarding the next appointment.

BI-RADS CATEGORY  2: Benign.

## 2021-09-23 ENCOUNTER — Other Ambulatory Visit: Payer: Self-pay | Admitting: Internal Medicine

## 2021-09-23 DIAGNOSIS — Z006 Encounter for examination for normal comparison and control in clinical research program: Secondary | ICD-10-CM

## 2021-09-23 DIAGNOSIS — J84112 Idiopathic pulmonary fibrosis: Secondary | ICD-10-CM

## 2021-09-30 ENCOUNTER — Encounter (HOSPITAL_COMMUNITY): Payer: Medicare Other | Admitting: Internal Medicine

## 2021-10-01 ENCOUNTER — Other Ambulatory Visit (HOSPITAL_BASED_OUTPATIENT_CLINIC_OR_DEPARTMENT_OTHER): Payer: Medicare Other

## 2021-10-01 DIAGNOSIS — N3001 Acute cystitis with hematuria: Secondary | ICD-10-CM | POA: Diagnosis not present

## 2021-10-01 IMAGING — DX DG CHEST 2V
2 series · 2 of 2 positions shown · non-contrast
Comparison: 10/02/2007

CLINICAL DATA: Worsening dyspnea on exertion

EXAM:
CHEST - 2 VIEW

[chest pa]
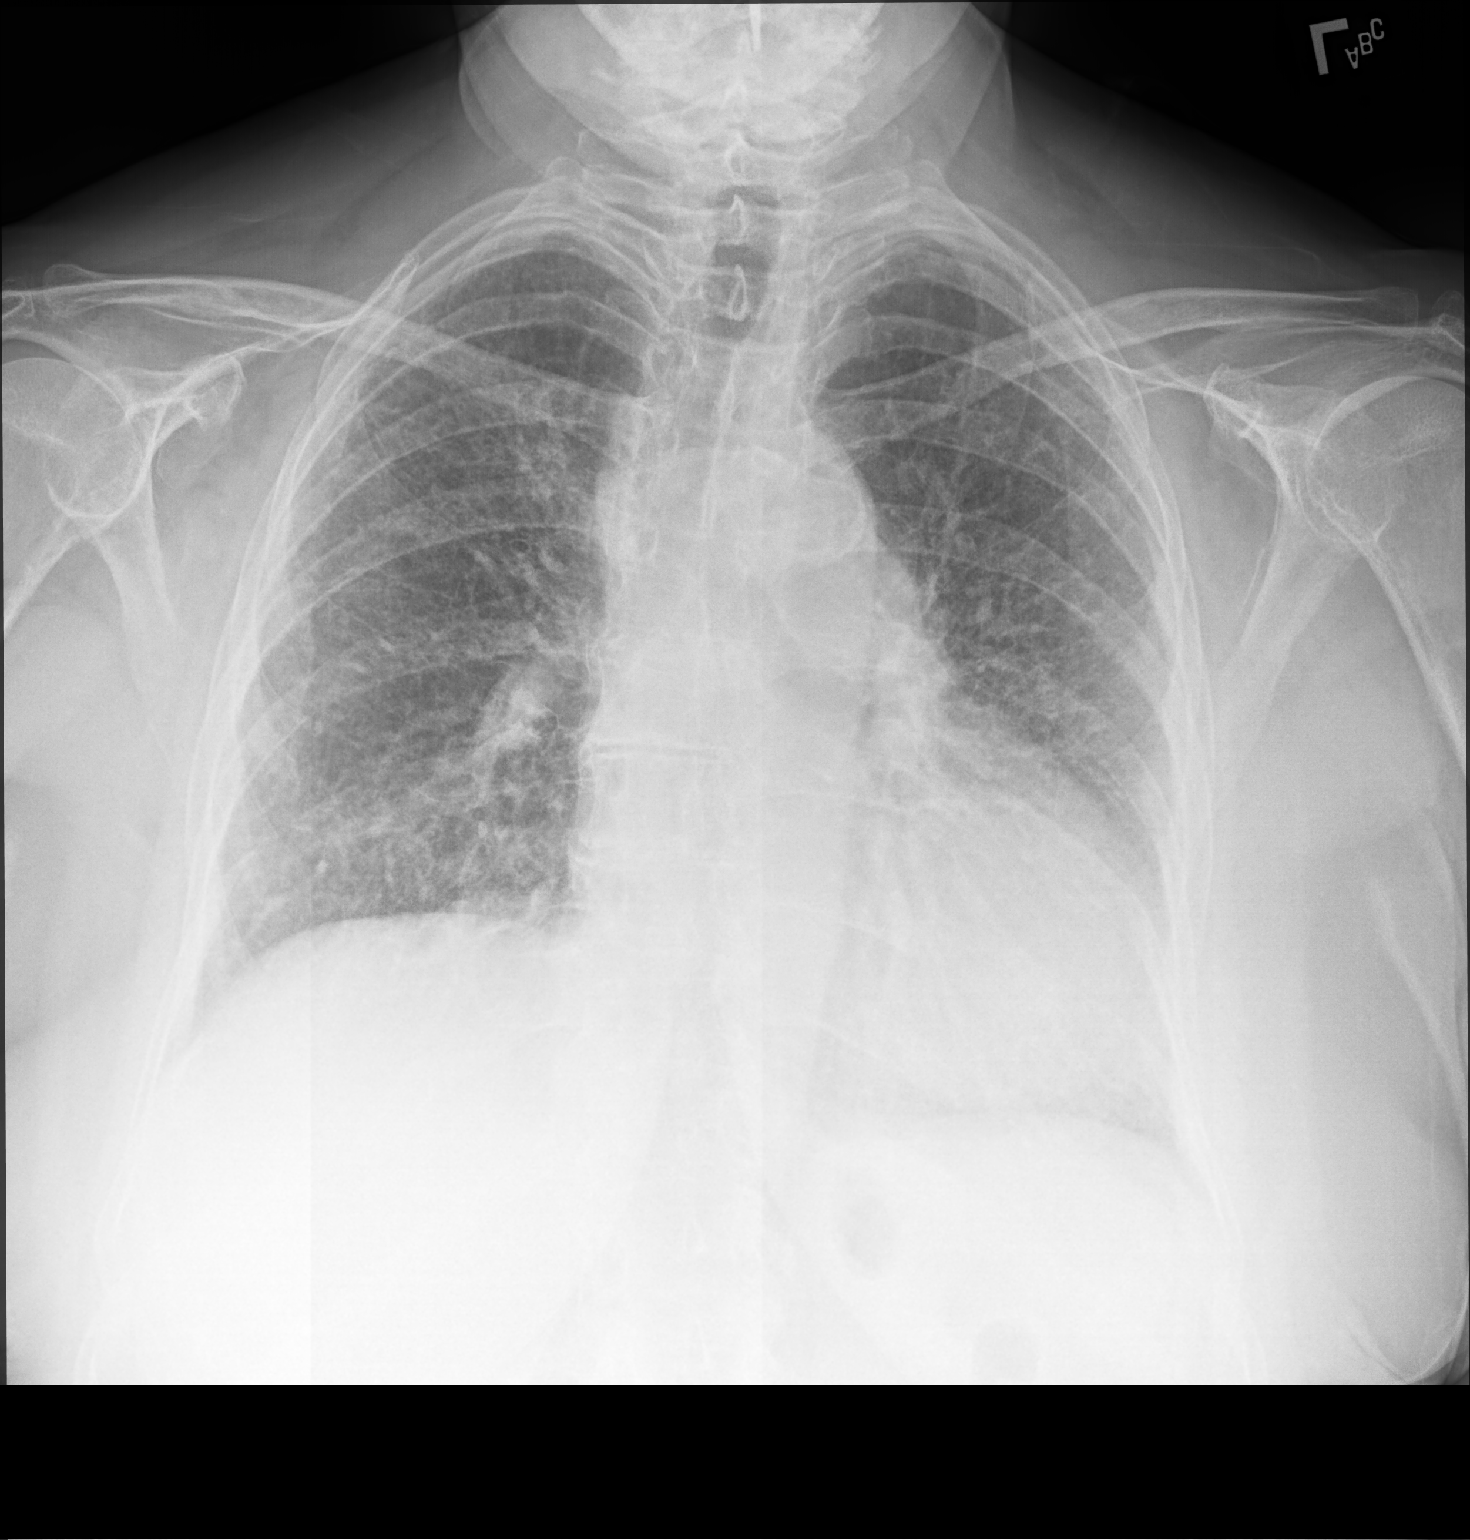

[chest lat]
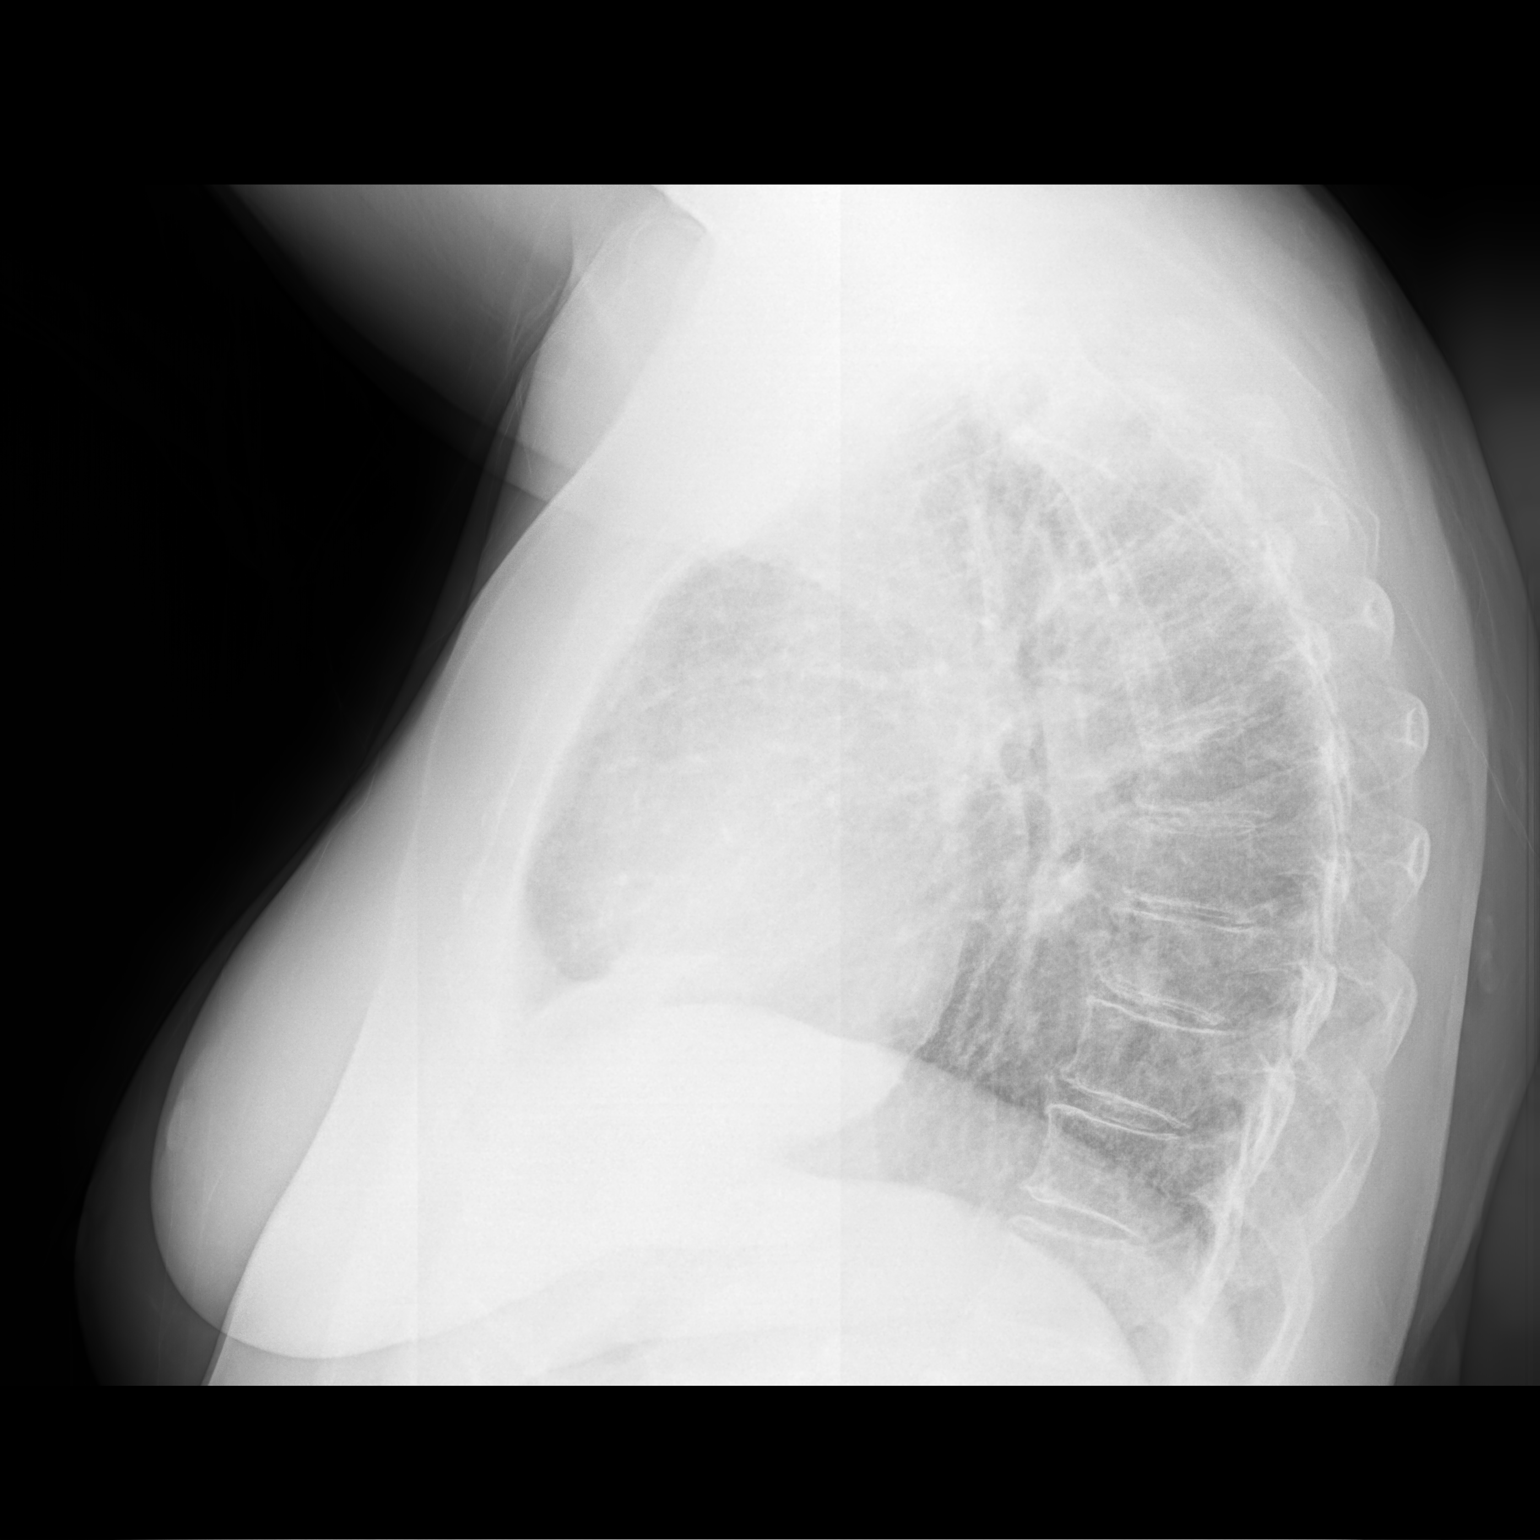

[2 of 2 positions shown; findings below may reference images not displayed]

FINDINGS: No consolidation or pleural effusion. Diffuse increased interstitial
opacity compared to prior. Cardiomegaly with aortic atherosclerosis.
No pneumothorax.
IMPRESSION: Diffusely increased interstitial opacity of uncertain acuity. No
confluent airspace disease. Mild cardiomegaly.

## 2021-10-11 ENCOUNTER — Other Ambulatory Visit: Payer: Self-pay | Admitting: Family Medicine

## 2021-10-13 ENCOUNTER — Encounter: Payer: Medicare Other | Admitting: *Deleted

## 2021-10-13 ENCOUNTER — Ambulatory Visit (HOSPITAL_BASED_OUTPATIENT_CLINIC_OR_DEPARTMENT_OTHER)
Admission: RE | Admit: 2021-10-13 | Discharge: 2021-10-13 | Disposition: A | Discharge status: Home / Self Care | Payer: Medicare Other | Source: Ambulatory Visit | Attending: Internal Medicine | Admitting: Internal Medicine

## 2021-10-13 DIAGNOSIS — Z006 Encounter for examination for normal comparison and control in clinical research program: Secondary | ICD-10-CM

## 2021-10-13 DIAGNOSIS — J84112 Idiopathic pulmonary fibrosis: Secondary | ICD-10-CM | POA: Diagnosis not present

## 2021-10-13 DIAGNOSIS — J479 Bronchiectasis, uncomplicated: Secondary | ICD-10-CM | POA: Diagnosis not present

## 2021-10-20 ENCOUNTER — Encounter: Payer: Medicare Other | Admitting: *Deleted

## 2021-10-20 DIAGNOSIS — J84112 Idiopathic pulmonary fibrosis: Secondary | ICD-10-CM

## 2021-10-20 DIAGNOSIS — U071 COVID-19: Secondary | ICD-10-CM

## 2021-10-20 DIAGNOSIS — Z006 Encounter for examination for normal comparison and control in clinical research program: Secondary | ICD-10-CM

## 2021-10-20 MED ORDER — PAXLOVID (150/100) 10 X 150 MG & 10 X 100MG PO TBPK: 3.0000 | ORAL_TABLET | Freq: Two times a day (BID) | ORAL | 0 refills | Status: DC

## 2021-10-20 NOTE — Progress Notes (Signed)
Subject described increasing dyspnea over past week & congestion overnight. COVID 19 swab positive , confirmed with repeat swab.  Enrollment deferred. Most current labs reviewed; GFR 54. Paxlovid 100/150 Rxed as 2 pills bid ,dispense 20. She is to hold her statin for 2 weeks. Unice Cobble MD,SI

## 2021-10-20 NOTE — Addendum Note (Signed)
Addended by: Rickey Primus on: 10/20/2021 10:24 AM   Modules accepted: Orders

## 2021-10-20 NOTE — Research (Addendum)
TITLE: A Phase 2, Randomized, Double-Blind, Placebo-Controlled Study to Evaluate the Safety and Efficacy of [id number] in Patients With Idiopathic Pulmonary Fibrosis  Protocol #: [id number] NCT: [employer]  Protocol Version: version 2: approved [id number] IB:  version 8.0: approved 20July2022 ICF: Main: V2.0 approved 08Apr2022, modified [id number]  Pregnancy: V2.0 approved 25Jan2023  Study Design:  This is a randomized, double-blinded, placebo-controlled multicenter study to evaluate the safety and efficacy of [id number] in patients with IPF with or without standard-of-care. 2:1 randomization ratio to [id number] 150 mg BID or the matching placebo for  24 weeks.    Mechanism of Action Proline is one of the largest constituents of collagen. In IPF, there is excessive deposition of collagen. Prolyl-tRNA Synthetase (PRS) is an enzyme that conjugates proline. [id number] (Bersiporocin) is the world's first selective PRS inhibitor that decreases collagen formation and subsequent pro-fibrotic markers.  Administration  A dose of [id number] 150 mg BID will be administered orally in the fasted state or at least 2 hours after the last meal, for 24 weeks.   Key Inclusion Criteria age = 88 years Documented diagnosis of IPF per the 2018 ATS/ERS/JRS/ALAT Clinical Practice Meeting all of the following criteria during the screening period:             FVC ? 40% predicted            DLCOcor ?25% to ? 80%              (FEV1)/FVC ratio ? 0.7             Able to walk at least 150 m in 6MWT, resting SpO2 should be ? 88% with a maximum of 6L O2/min  On a stable dose of pirfenidone OR nintedanib for at least 3 months OR on neither pirfenidone nor nintedanib.  Key Exclusion Criteria Currently taking medication known as a strong CYP2D6 inhibitor OR taking medication known to be CYP2D6 inducers OR CYP2D6 substrate with narrow therapeutic index. GFR  < 30 mL/min/1.52m moderate to severe  hepatic impairment (Child-Pugh B and C). Patients with =3upper limit of normal of alanine aminotransferase, aspartate aminotransferase or gamma-glutamyl transpeptidase. Abnormal ECG findings including but not limited to QTc >500 ms.  Pharmacokinetics Urine PK data indicate that renal elimination is not the major clearance pathway for [id number]   Adverse effects and risk Overall, when [id number] administered concomitantly with Pirfenidone and Nintedanib, [id number] mg enteric-coated tablet was generally well tolerated and safe.   Safety data from edition number [id number] abstracted in March 2023.   Gastrointestinal adverse reactions (e.g Diarrhea, abdominal pain, nausea, vomiting) followed by CNS (headache and dizziness) as below were the most commonly observed adverse reactions.  Most adverse reactions were mild or moderate and reversible.  An improved enteric-coated 150 mg tablet (new) was reformulated to lower the initial dissolution rate in pH 6.8. Enteric-coated tablets (new) demonstrated that nausea and vomiting appear to have decreased. While the incidence rate of diarrhea was slightly increased than old formulation administration, the severity was all mild.  Below table comprises predominantly of enteric coated tablet.  Overall adverse event % n=229 Average of 4 different studies [id number]=24(part 1) Esbriet combination [id number]=24(part 2) Ofev combination   Nausea 54 out of 229 23.58% 6(25%) 2(8.33%)  Vomiting 37 out of 205 18.04% 1(4.17%) 1(4.17%)  Diarrhea 37 out of 229 16.15% 2(8.33%) 7(29.17%)  Abdominal pain 7 out of 229 3.05% 0 1(4.17%)  Constipation 4 out of 54  7.4% - -  Abdominal discomfort  4 out of 205  1.95% 0 1(4.17%)  Headache 25 out of 205  12.19% 4(16.67%) 1(4.17%)  Dizziness 8 out of 175 (4.57%) 3(12.50%) 1(4.17%)   Severity of TEAE Table  Overall adverse event % n=229 Average of 4 different studies [id number] n=24(part 1) Esbriet  combination [id number] n=24(part 2) Ofev combination   TEAE - occurrences  382 19 18  Severity- mild 351 (91.88%) 18 (94.73%) 17 (94.44%)   Moderate 31 (8.11%) 1 (5.26%) 1 (5.55%)  Severe 0 (0%) 0 (0%)  0 (0%)     91% TEAEs were mild  (96 of 106 TEAEs, including all 16 TEAEs in the placebo group). 9% (n=10) were moderate.   TEAEs occurred in a dose dependent manner with 7 of the 10 occurring in daily doses >= 667m  All moderate TEAEs were classed as Gastrointestinal Disorders (nausea, vomiting, diarrhea, and epigastric discomfort), were considered recovered/resolved within 24 hours, and had no sequelae. There were no severe, life-threatening or fatal TEAEs across the study. There were no subjects with serious TEAEs, and there were no subjects with TEAEs leading to IP discontinuation during Part 1 (SAD) of the study.  EKG concerns There were no effects on ECG with dose up to 80 mg/kg in cynomolgus monkeys. In humans no EKG abnormalities reported  except in Part 2 (([id number] concomitantly with Nintedanib), one case of PR prolongation LFT concerns In rats, reversible, minimal DWN12088HCl-related centrilobular hepatocyte hypertrophy was noted in the liver administered ?50 mg/kg/day and was considered consistent with DWN12088HCl-related induction of hepatocellular enzymes.  In humans, on investigation in the MAD study of six subjects, hepatic enzyme was increased in one participant (16.7%).   Rare concerns based on animal data - not seen in humans Excessive salivation in rats  at doses >50 mg/kg/day and mildly diminished appetite in 1 monkey was observed on one occasion.  With extremely high doses of 1200 mg/kg decreased activity, increased/labored/shallow respiration, gasping, vocalizing and piloerection was observed in female (but not female) rats 6h post dose.  Subject was called on 08Aug2023 stating no new symptoms just returned from vacation on Tuesday. Came in for  Research Randomization today and tested positive for Covid.  Subject presenting today with a little congestion.  Sub-Investigator Dr. WUnice Cobbleassessed subject and reviewed lab work.  PI and Sub-I communicated on plan to RX Paxlovid for subject.  Subject was given a Hand written RX by Dr. HLinna Darner  Since GFR was 54 in June suggested for subject to take 2 pills twice a day. No further assessments were completed today for visit.  Will follow up with subject early next week.    Signed by  LJaye BeagleRN MSN/MBA  Clinical Research Coordinator / Nurse [facility]  [place]10:16 AM 10/20/2021

## 2021-10-22 NOTE — Research (Signed)
Not on previous visit appt from same day

## 2021-10-22 NOTE — Research (Signed)
TITLE: A Phase 2, Randomized, Double-Blind, Placebo-Controlled Study to Evaluate the Safety and Efficacy of [id number] in Patients With Idiopathic Pulmonary Fibrosis  Protocol #: [id number] NCT: [employer]  As of 10/13/2021 Protocol Version: version 2: approved 31Mar22022 IB:  version 8.0: approved 20July2022 ICF: Main: V2.0 approved 08Apr2022, modified [id number]  Pregnancy: V2.0 approved 25Jan2023  Study Design:  This is a randomized, double-blinded, placebo-controlled multicenter study to evaluate the safety and efficacy of [id number] in patients with IPF with or without standard-of-care. 2:1 randomization ratio to [id number] 150 mg BID or the matching placebo for  24 weeks.    Mechanism of Action Proline is one of the largest constituents of collagen. In IPF, there is excessive deposition of collagen. Prolyl-tRNA Synthetase (PRS) is an enzyme that conjugates proline. [id number] (Bersiporocin) is the world's first selective PRS inhibitor that decreases collagen formation and subsequent pro-fibrotic markers.  Administration  A dose of [id number] 150 mg BID will be administered orally in the fasted state or at least 2 hours after the last meal, for 24 weeks.   Key Inclusion Criteria age = 75 years Documented diagnosis of IPF per the 2018 ATS/ERS/JRS/ALAT Clinical Practice Meeting all of the following criteria during the screening period:             FVC ? 40% predicted            DLCOcor ?25% to ? 80%              (FEV1)/FVC ratio ? 0.7             Able to walk at least 150 m in 6MWT, resting SpO2 should be ? 88% with a maximum of 6L O2/min  On a stable dose of pirfenidone OR nintedanib for at least 3 months OR on neither pirfenidone nor nintedanib.  Key Exclusion Criteria Currently taking medication known as a strong CYP2D6 inhibitor OR taking medication known to be CYP2D6 inducers OR CYP2D6 substrate with narrow therapeutic index. GFR  < 30  mL/min/1.71m moderate to severe hepatic impairment (Child-Pugh B and C). Patients with =3upper limit of normal of alanine aminotransferase, aspartate aminotransferase or gamma-glutamyl transpeptidase. Abnormal ECG findings including but not limited to QTc >500 ms.  Pharmacokinetics Urine PK data indicate that renal elimination is not the major clearance pathway for [id number]   Adverse effects and risk Overall, when [id number] administered concomitantly with Pirfenidone and Nintedanib, [id number] mg enteric-coated tablet was generally well tolerated and safe.   Safety data from edition number [id number] abstracted in March 2023.   Gastrointestinal adverse reactions (e.g Diarrhea, abdominal pain, nausea, vomiting) followed by CNS (headache and dizziness) as below were the most commonly observed adverse reactions.  Most adverse reactions were mild or moderate and reversible.  An improved enteric-coated 150 mg tablet (new) was reformulated to lower the initial dissolution rate in pH 6.8. Enteric-coated tablets (new) demonstrated that nausea and vomiting appear to have decreased. While the incidence rate of diarrhea was slightly increased than old formulation administration, the severity was all mild.  Below table comprises predominantly of enteric coated tablet.  Overall adverse event % n=229 Average of 4 different studies [id number]=24(part 1) Esbriet combination [id number]=24(part 2) Ofev combination   Nausea 54 out of 229 23.58% 6(25%) 2(8.33%)  Vomiting 37 out of 205 18.04% 1(4.17%) 1(4.17%)  Diarrhea 37 out of 229 16.15% 2(8.33%) 7(29.17%)  Abdominal pain 7 out of 229 3.05% 0 1(4.17%)  Constipation 4 out of 54  7.4% - -  Abdominal discomfort 4 out of 205  1.95% 0 1(4.17%)  Headache 25 out of 205  12.19% 4(16.67%) 1(4.17%)  Dizziness 8 out of 175 (4.57%) 3(12.50%) 1(4.17%)   Severity of TEAE Table  Overall adverse event % n=229 Average of 4 different studies  [id number] n=24(part 1) Esbriet combination [id number] n=24(part 2) Ofev combination   TEAE - occurrences  382 19 18  Severity- mild 351 (91.88%) 18 (94.73%) 17 (94.44%)   Moderate 31 (8.11%) 1 (5.26%) 1 (5.55%)  Severe 0 (0%) 0 (0%)  0 (0%)     91% TEAEs were mild  (96 of 106 TEAEs, including all 16 TEAEs in the placebo group). 9% (n=10) were moderate.   TEAEs occurred in a dose dependent manner with 7 of the 10 occurring in daily doses >= 6105m  All moderate TEAEs were classed as Gastrointestinal Disorders (nausea, vomiting, diarrhea, and epigastric discomfort), were considered recovered/resolved within 24 hours, and had no sequelae. There were no severe, life-threatening or fatal TEAEs across the study. There were no subjects with serious TEAEs, and there were no subjects with TEAEs leading to IP discontinuation during Part 1 (SAD) of the study.  EKG concerns There were no effects on ECG with dose up to 80 mg/kg in cynomolgus monkeys. In humans no EKG abnormalities reported  except in Part 2 (([id number] concomitantly with Nintedanib), one case of PR prolongation LFT concerns In rats, reversible, minimal DWN12088HCl-related centrilobular hepatocyte hypertrophy was noted in the liver administered ?50 mg/kg/day and was considered consistent with DWN12088HCl-related induction of hepatocellular enzymes.  In humans, on investigation in the MAD study of six subjects, hepatic enzyme was increased in one participant (16.7%).   Rare concerns based on animal data - not seen in humans Excessive salivation in rats  at doses >50 mg/kg/day and mildly diminished appetite in 1 monkey was observed on one occasion.  With extremely high doses of 1200 mg/kg decreased activity, increased/labored/shallow respiration, gasping, vocalizing and piloerection was observed in female (but not female) rats 6h post dose.  [facility] @ CGeraldCoordinator note :   This visit for  Subject PKLAIRE COURTwith DOB: [id number] 10/13/2021 for the above protocol is Visit/Encounter # Randomization and is for purpose of research.   Protocol Version: version 2: approved 31Mar22022 IB:  version 8.0: approved 20July2022 ICF: Main: V2.0 approved 08Apr2022, modified [id number] Pregnancy: V2.0 approved 25Jan2023  Subject expressed continued interest and consent in continuing as a study subject. Subject confirmed that there was no change in contact information (e.g. address, telephone, email). Subject thanked for participation in research and contribution to science.  In this visit 10/13/2021 the subject will be evaluated by Sub-Investigator Dr. WUnice Cobble This research coordinator has verified that the above investigator is up to date with his/her training logs.   The Subject was informed that the PI Dr. MBrand Malescontinues to have oversight of the subject's visits and course  through relevant discussions, reviews and also specifically of this visit by routing of this note to the PPajaros Dr. RChase Callerwas able to visit with subject later in visit.   This visit is a key visit of Randomization. The PI is not available for this visit.   Because the PI is NOT available, the sub-I reported and CRC has confirmed that the PI have discussed the visit a-prior with the sub-investigator.   Subject completed all assessments for this visit. The visit was paused after a desaturation at the end of the 6MWT.  The monitor and Medical Monitor were both contacted for clarification of a titration walk with oxygen. This conversation and clarification delayed the visit too long and the subject needed to leave for family situation.  Clarification was received by Medical Monitor later that evening.  Subject did not receive study drug / placebo and was approved by medical monitor and study monitor to return at a later date to complete the whole randomization visit again.   Signed by  Jaye Beagle  RN MSN/MBA  Clinical Research Coordinator / Nurse [facility]  [place], [place] 10:58 AM 10/22/2021

## 2021-10-22 NOTE — Research (Signed)
Subject note on later appointment time same day separate due to having to go to HRCT at [facility].

## 2021-10-25 ENCOUNTER — Ambulatory Visit: Payer: Medicare Other | Admitting: Internal Medicine

## 2021-10-25 NOTE — Telephone Encounter (Signed)
Seems like encounter was open in error so closing encounter.  

## 2021-10-31 ENCOUNTER — Encounter: Payer: Self-pay | Admitting: Pharmacist

## 2021-11-01 NOTE — Progress Notes (Incomplete)
Advanced Heart Failure Clinic Note   PCP: Dr Rosendo Gros  Primary Cardiologist: Dr Margaretann Loveless HF Cardiologist: Dr. 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 She was referred to the HF clinic by Dr Alveda Reasons with heart failure consultation 01/2021.   Previously a smoker 1 pack/week for 10 years. Quit ~ 2005.   Has had progressive SOB for  > 1 year. Found to have mild pulmonary pulmonary fibrosis. Prior to lung biopsy had echo 7/21 EF 30-35%. Then had R/L cath mild CAD and normal pulmonary pressures. Underwent lung biopsy 10/21 which confirmed UIP.  Has been referred to Dr. Margaretann Loveless and had GDMT titrated. Now on valsartan 320 daily, carvedilol 25 bid, Farxiga 10, spiro 25 daily   cMRI 3/22 LVEF 41% RVEF 41% . No scar, inflammation of infiltrative process.   Previous sleep study negative > 10 years ago.  CTD serologies negative 6/21  Echo 3/23 EF 30-35%   CPX 6/23 showed submax efforts suggesting significant deconditioning/decreased exercise tolerance but moderately reduced exercise capacity. Marked chronotropic incompetence.   Today she returns for HF follow up with her husband. She continues to follow in IPF clinic with Dr. Chase Caller. Had COVID 2 weeks ago and received Paxlovid. She remains generally fatigued and SOB with minimal activity. Chronically dizzy. Denies abnormal bleeding, palpitations, CP,  edema, or PND/Orthopnea. Appetite ok. No fever or chills. Weight at home stable. Taking all medications. Has oxygen at home 2L PRN, but has not helped with dyspnea.   Cardiac Testing  - CPX (6/23): Peak VO2: 12.1 (80% predicted peak VO2)  VE/VCO2 slope:  39  OUES: 1.47  Peak RER: 0.95  Ventilatory Threshold: 9.9 (66% predicted or measured peak VO2)  Peak RR 38  Peak Ventilation:  44.4  VE/MVV:  56%  PETCO2 at peak:  31   FEV1 1.82 (80%) FVC 1.96 (66%)  -  PFTs 2/23  FEV1 1.87L (79%) FVC 2.13 (67%) DLCO 58%  - Hi-res CT 4/22: Stable IPF   - PFTs 08/23/19 FEV1 2.26L (92%) FVC 2.61 (80%) DLCO 64%  - cMRI (3/22): LVEF 41% RVEF 41% . No scar, inflammation of infiltrative process.   - Echo (12/21): EF 30-35%   - RHC (8/21): RA 1 RV 23/2 PA 22/3 (11) PCWP 8 CO 6.6 CI 3.3   - Previously tried:  Entresto 49/51 mg (dry/itchy mouth, dry eyes) Lisinopril-hctz (SOB, coughing) Metoprolol tartrate (SOB, coughing) Clonidine  Past Medical History:  Diagnosis Date   Anemia    Asthma    "I dont think I have it.   CHF (congestive heart failure) (HCC)    Depression    Emphysema of lung (HCC)    GERD (gastroesophageal reflux disease)    Hemorrhoids    History of kidney stones    Cystoscopy   Hyperlipidemia    Hypertension    Hypothyroidism    Obesity    Pre-diabetes    Rosacea    Tubular adenoma of colon 03/26/2019   Colonoscopy repeat in 3 years   Current Outpatient Medications  Medication Sig Dispense Refill   amLODipine (NORVASC) 5 MG tablet TAKE ONE TABLET BY MOUTH DAILY 90 tablet 3   benzonatate (TESSALON) 200 MG capsule Take 1 capsule (200 mg total) by mouth 3 (three) times daily as needed for cough. 30 capsule 1   carvedilol (COREG) 25 MG tablet Take 1 tablet (25 mg total) by  mouth 2 (two) times daily. 180 tablet 3   cetirizine (ZYRTEC) 10 MG tablet Take 10 mg by mouth daily.     FARXIGA 10 MG TABS tablet Take 1 tablet (10 mg total) by mouth daily. 30 tablet 4   levothyroxine (SYNTHROID) 125 MCG tablet TAKE ONE TABLET BY MOUTH DAILY 90 tablet 0   montelukast (SINGULAIR) 10 MG tablet TAKE ONE TABLET BY MOUTH EVERY NIGHT AT BEDTIME 90 tablet 1   Multiple Vitamin (MULTIVITAMIN WITH MINERALS) TABS tablet Take 1 tablet by mouth daily.     omeprazole (PRILOSEC) 20 MG capsule Take 1 capsule (20 mg total) by mouth daily. 90 capsule 3   ondansetron (ZOFRAN-ODT) 4 MG disintegrating tablet DISSOLVE ONE TABLET BY MOUTH EVERY 8 HOURS AS  NEEDED FOR NAUSEA/ VOMITING 20 tablet 0   Pirfenidone (ESBRIET) 801 MG TABS Take 801 mg by mouth with breakfast, with lunch, and with evening meal. 90 tablet 11   rosuvastatin (CRESTOR) 10 MG tablet TAKE ONE TABLET BY MOUTH DAILY 90 tablet 3   spironolactone (ALDACTONE) 25 MG tablet Take 1 tablet (25 mg total) by mouth daily. 60 tablet 1   valsartan (DIOVAN) 320 MG tablet Take 1 tablet (320 mg total) by mouth daily. 90 tablet 3   venlafaxine XR (EFFEXOR-XR) 150 MG 24 hr capsule Take 1 capsule (150 mg total) by mouth daily. 90 capsule 3   No current facility-administered medications for this encounter.   Allergies  Allergen Reactions   Ace Inhibitors Cough   Clarithromycin     REACTION: GI sx   Codeine Nausea Only   Sulfamethoxazole Nausea Only   Telmisartan Other (See Comments)    sleepy   Social History   Socioeconomic History   Marital status: Married    Spouse name: Not on file   Number of children: Not on file   Years of education: Not on file   Highest education level: Not on file  Occupational History   Not on file  Tobacco Use   Smoking status: Former    Packs/day: 0.50    Years: 15.00    Total pack years: 7.50    Types: Cigarettes    Quit date: 08/18/2013    Years since quitting: 8.2   Smokeless tobacco: Never  Vaping Use   Vaping Use: Never used  Substance and Sexual Activity   Alcohol use: Not Currently    Alcohol/week: 1.0 standard drink of alcohol    Types: 1 Glasses of wine per week    Comment: 2-3 glasses of wine weekly   Drug use: No   Sexual activity: Yes  Other Topics Concern   Not on file  Social History Narrative   Not on file   Social Determinants of Health   Financial Resource Strain: Not on file  Food Insecurity: Not on file  Transportation Needs: Not on file  Physical Activity: Not on file  Stress: Not on file  Social Connections: Not on file  Intimate Partner Violence: Not on file   Family History  Problem Relation Age of Onset    Heart disease Mother    Hypertension Mother    Osteoporosis Mother    Stroke Mother    Alcohol abuse Father    Diabetes Maternal Grandmother    Drug abuse Son    Breast cancer Neg Hx    BP 106/66   Pulse 69   Wt 88.6 kg (195 lb 6.4 oz)   SpO2 94%   BMI 30.60 kg/m   Wt  Readings from Last 3 Encounters:  11/02/21 88.6 kg (195 lb 6.4 oz)  08/06/21 90.4 kg (199 lb 3.2 oz)  08/04/21 89.4 kg (197 lb)   PHYSICAL EXAM: General:  NAD. No resp difficulty, walked into clinic, fatigued-appearing. HEENT: Normal Neck: Supple. No JVD. Carotids 2+ bilat; no bruits. No lymphadenopathy or thryomegaly appreciated. Cor: PMI nondisplaced. Regular rate & rhythm. No rubs, gallops or murmurs. Lungs: Clear Abdomen: Soft, nontender, nondistended. No hepatosplenomegaly. No bruits or masses. Good bowel sounds. Extremities: No cyanosis, clubbing, rash, edema Neuro: Alert & oriented x 3, cranial nerves grossly intact. Moves all 4 extremities w/o difficulty. Affect pleasant.  ASSESSMENT & PLAN: 1. Chronic Systolic Heart Failure due to NICM - Echo 02/2020 EF 40% RV nomral  - cMRI 05/2020 LVEF 41% RVEF 42% . No scar,fibrosis, inflammation, or infiltrative process.  - cath 7/21 non-obstructive CAD - Echo 3/23 EF 30-35%  - Etiology remains unclear. Denies FHx of HF. - CPX (6/23) submax effort, but moderate HF limitation VE/VCo2 slope 39, with marked chronotropic incompetence.  - Worse NYHA III-IIIb, limited by fatigue and dizziness. Volume looks good today. ? Role recent COVID infection playing in symptoms, but she has felt this way since before COVID infection. - Continue carvedilol 25 mg bid. - Continue Farxiga 10 mg daily. - Continue valsartan 320 mg daily (didn't tolerate Entresto). - Continue spiro 25 mg daily.  - Updated HST ordered. - Referred for ICD but not offered for primary prevention due to her age. 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 - BMET and BNP today.  2. IPF, progressive -  followed by Dr. Chase Caller - PFTs worsening but hi-res CT 2/23 stable - No PAH on RHC so does not qualify for Tyvaso - Continue Esbreit   3. CAD, nonobstructive by cath 7/21 - No s/s angina  - Continue ASA/statin per Dr. Margaretann Loveless  4. HTN - Blood pressure well controlled at home.  Follow up in 3-4 months with Dr. Haroldine Laws.  Rafael Bihari, FNP  10:44 AM

## 2021-11-02 ENCOUNTER — Ambulatory Visit (HOSPITAL_COMMUNITY)
Admission: RE | Admit: 2021-11-02 | Discharge: 2021-11-02 | Disposition: A | Discharge status: Home / Self Care | Payer: Medicare Other | Source: Ambulatory Visit | Attending: Family Medicine | Admitting: Family Medicine

## 2021-11-02 ENCOUNTER — Encounter (HOSPITAL_COMMUNITY): Payer: Self-pay

## 2021-11-02 VITALS — BP 106/66 | HR 69 | Wt 195.4 lb

## 2021-11-02 DIAGNOSIS — I2510 Atherosclerotic heart disease of native coronary artery without angina pectoris: Secondary | ICD-10-CM | POA: Insufficient documentation

## 2021-11-02 DIAGNOSIS — I5020 Unspecified systolic (congestive) heart failure: Secondary | ICD-10-CM | POA: Diagnosis not present

## 2021-11-02 DIAGNOSIS — I428 Other cardiomyopathies: Secondary | ICD-10-CM | POA: Insufficient documentation

## 2021-11-02 DIAGNOSIS — J84112 Idiopathic pulmonary fibrosis: Secondary | ICD-10-CM | POA: Insufficient documentation

## 2021-11-02 DIAGNOSIS — I779 Disorder of arteries and arterioles, unspecified: Secondary | ICD-10-CM

## 2021-11-02 DIAGNOSIS — Z79899 Other long term (current) drug therapy: Secondary | ICD-10-CM | POA: Insufficient documentation

## 2021-11-02 DIAGNOSIS — Z8616 Personal history of COVID-19: Secondary | ICD-10-CM | POA: Insufficient documentation

## 2021-11-02 DIAGNOSIS — Z7984 Long term (current) use of oral hypoglycemic drugs: Secondary | ICD-10-CM | POA: Insufficient documentation

## 2021-11-02 DIAGNOSIS — E785 Hyperlipidemia, unspecified: Secondary | ICD-10-CM | POA: Diagnosis not present

## 2021-11-02 DIAGNOSIS — I10 Essential (primary) hypertension: Secondary | ICD-10-CM

## 2021-11-02 DIAGNOSIS — I110 Hypertensive heart disease with heart failure: Secondary | ICD-10-CM | POA: Insufficient documentation

## 2021-11-02 DIAGNOSIS — I5022 Chronic systolic (congestive) heart failure: Secondary | ICD-10-CM | POA: Diagnosis not present

## 2021-11-02 DIAGNOSIS — Z87891 Personal history of nicotine dependence: Secondary | ICD-10-CM | POA: Diagnosis not present

## 2021-11-02 DIAGNOSIS — R42 Dizziness and giddiness: Secondary | ICD-10-CM | POA: Insufficient documentation

## 2021-11-02 LAB — BRAIN NATRIURETIC PEPTIDE: B Natriuretic Peptide: 16.8 pg/mL (ref 0.0–100.0)

## 2021-11-02 LAB — BASIC METABOLIC PANEL
Anion gap: 7 (ref 5–15)
BUN: 21 mg/dL (ref 8–23)
CO2: 24 mmol/L (ref 22–32)
Calcium: 9.7 mg/dL (ref 8.9–10.3)
Chloride: 107 mmol/L (ref 98–111)
Creatinine, Ser: 1.13 mg/dL — ABNORMAL HIGH (ref 0.44–1.00)
GFR, Estimated: 50 mL/min — ABNORMAL LOW (ref >60)
Glucose, Bld: 146 mg/dL — ABNORMAL HIGH (ref 70–99)
Potassium: 4.6 mmol/L (ref 3.5–5.1)
Sodium: 138 mmol/L (ref 135–145)

## 2021-11-02 NOTE — Patient Instructions (Signed)
It was great to see you today! No medication changes are needed at this time.   Labs today We will only contact you if something comes back abnormal or we need to make some changes. Otherwise no news is good news!  Your physician recommends that you schedule a follow-up appointment in: 3 months with Dr Haroldine Laws   Do the following things EVERYDAY: Weigh yourself in the morning before breakfast. Write it down and keep it in a log. Take your medicines as prescribed Eat low salt foods--Limit salt (sodium) to 2000 mg per day.  Stay as active as you can everyday Limit all fluids for the day to less than 2 liters  At the [facility], you and your health needs are our priority. As part of our continuing mission to provide you with exceptional heart care, we have created designated Provider Care Teams. These Care Teams include your primary Cardiologist (physician) and Advanced Practice Providers (APPs- Physician Assistants and Nurse Practitioners) who all work together to provide you with the care you need, when you need it.   You may see any of the following providers on your designated Care Team at your next follow up: Dr Glori Bickers Dr Haynes Kerns, NP Lyda Jester, [place] [facility] [place], [place] Audry Riles, PharmD   Please be sure to bring in all your medications bottles to every appointment.

## 2021-11-02 NOTE — Addendum Note (Signed)
Encounter addended by: Rafael Bihari, FNP on: 11/02/2021 2:01 PM  Actions taken: Clinical Note Signed

## 2021-11-08 ENCOUNTER — Other Ambulatory Visit: Payer: Self-pay | Admitting: Internal Medicine

## 2021-11-10 ENCOUNTER — Encounter: Payer: Self-pay | Admitting: Internal Medicine

## 2021-11-10 MED ORDER — SPIRONOLACTONE 25 MG PO TABS: 25.0000 mg | ORAL_TABLET | Freq: Every day | ORAL | 0 refills | Status: DC

## 2021-11-22 ENCOUNTER — Telehealth: Payer: Self-pay | Admitting: *Deleted

## 2021-11-22 NOTE — Patient Outreach (Signed)
  Care Coordination   11/22/2021 Name: Susan Hunt MRN: [id number] DOB: 09/17/1945   Care Coordination Outreach Attempts:  An unsuccessful telephone outreach was attempted today to offer the patient information about available care coordination services as a benefit of their health plan.   Follow Up Plan:  Additional outreach attempts will be made to offer the patient care coordination information and services.   Encounter Outcome:  No Answer  Care Coordination Interventions Activated:  No   Care Coordination Interventions:  No, not indicated    Raina Mina, RN Care Management Coordinator [place] Office [phone]

## 2021-12-03 ENCOUNTER — Other Ambulatory Visit: Payer: Self-pay

## 2021-12-03 DIAGNOSIS — I5022 Chronic systolic (congestive) heart failure: Secondary | ICD-10-CM

## 2021-12-04 ENCOUNTER — Other Ambulatory Visit: Payer: Self-pay | Admitting: Family Medicine

## 2021-12-06 ENCOUNTER — Encounter: Payer: Self-pay | Admitting: *Deleted

## 2021-12-07 ENCOUNTER — Ambulatory Visit
Admission: RE | Admit: 2021-12-07 | Discharge: 2021-12-07 | Disposition: A | Discharge status: Home / Self Care | Payer: Medicare Other | Source: Ambulatory Visit | Attending: Family Medicine | Admitting: Family Medicine

## 2021-12-07 DIAGNOSIS — M85859 Other specified disorders of bone density and structure, unspecified thigh: Secondary | ICD-10-CM

## 2021-12-07 DIAGNOSIS — Z1231 Encounter for screening mammogram for malignant neoplasm of breast: Secondary | ICD-10-CM

## 2021-12-07 DIAGNOSIS — Z780 Asymptomatic menopausal state: Secondary | ICD-10-CM | POA: Diagnosis not present

## 2021-12-07 DIAGNOSIS — M8589 Other specified disorders of bone density and structure, multiple sites: Secondary | ICD-10-CM | POA: Diagnosis not present

## 2021-12-09 ENCOUNTER — Ambulatory Visit: Payer: Medicare Other | Attending: Internal Medicine | Admitting: Internal Medicine

## 2021-12-09 ENCOUNTER — Encounter: Payer: Self-pay | Admitting: Internal Medicine

## 2021-12-09 VITALS — BP 110/64 | HR 75 | Ht 67.0 in | Wt 197.0 lb

## 2021-12-09 DIAGNOSIS — E782 Mixed hyperlipidemia: Secondary | ICD-10-CM

## 2021-12-09 DIAGNOSIS — Z79899 Other long term (current) drug therapy: Secondary | ICD-10-CM | POA: Diagnosis not present

## 2021-12-09 DIAGNOSIS — I5022 Chronic systolic (congestive) heart failure: Secondary | ICD-10-CM | POA: Diagnosis not present

## 2021-12-09 DIAGNOSIS — I428 Other cardiomyopathies: Secondary | ICD-10-CM | POA: Diagnosis not present

## 2021-12-09 DIAGNOSIS — I10 Essential (primary) hypertension: Secondary | ICD-10-CM | POA: Diagnosis not present

## 2021-12-09 DIAGNOSIS — J84112 Idiopathic pulmonary fibrosis: Secondary | ICD-10-CM

## 2021-12-09 MED ORDER — CARVEDILOL 25 MG PO TABS: 25.0000 mg | ORAL_TABLET | Freq: Two times a day (BID) | ORAL | 3 refills | Status: DC

## 2021-12-09 NOTE — Progress Notes (Signed)
Cardiology Office Note:    Date:  12/09/2021  ID:  LIZANDRA ZAKRZEWSKI, DOB 10-Oct-1945, MRN [id number]  PCP:  Leamon Arnt, MD  Cardiologist:  Elouise Munroe, MD  Electrophysiologist:  None    Referring MD: Leamon Arnt, MD   Chief Complaint/Reason for Referral: Follow-up  History of Present Illness:    Susan Hunt is a 76 y.o. female with a history of nonischemic cardiomyopathy with normal right and left heart pressures, hypertension, hyperlipidemia, interstitial lung disease with pulmonary fibrosis. NICM currently felt to be related to HTN. LVEF 41% on cardiac MRI.   Heart Failure Therapy currently ACE-I/ARB/ARNI: Valsartan 320 mg daily BB: carvedilol 25 mg BID, transitioned from toprol xl for better BP control. MRA:  spironolactone 75 mg daily SGLT2I: farxiga 10 mg daily   Also on amlodipine 5 mg daily for HTN.    Previously tried:  Entresto 49/51 mg (dry/itchy mouth, dry eyes) Lisinopril-hctz (SOB, coughing) Metoprolol tartrate (SOB, coughing) Clonidine   She was previously class II dyspnea but at her last visit endorsed class III symptoms, with cough and significant DOE. No LE edema. She had started prednisone the day before her last visit for presumed ILD flare and felt better the next day.    Today, she is accompanied by her husband. She reports that her shortness of breath has not changed much since her last visit. She has started using oxygen at night, but complains of severe nasal discomfort due to her oxygen. Her husband noted that she does seem to feel slightly better in the mornings after using the oxygen at night time. She doesn't believe that a humidifier is necessary as the itchiness is mainly on the outside of her nose.   She confirms approval for her barostim that is scheduled for 12/30/2021. She is hopeful that the barostim will help her breathe better.  She has been experiencing "a lot" of fatigue during the day and feels like she is dragging.  Previously she completed a sleep study.   Her husband expressed some concerns about not recalling whether or not she had been taking her daily second dose of carvedilol. He attributed this to a possible missed refill. Her dose was previously increased to 25 mg and has been helping.   Her blood pressure in clinic today is 110/64. She denies feeling any associated symptoms. We continue to work with the assumption that her cardiomyopathy is hypertensive in origin.  She denies any palpitations, chest pain, or peripheral edema. No lightheadedness, headaches, syncope, orthopnea, or PND.    Past Medical History:  Diagnosis Date   Anemia    Asthma    "I dont think I have it.   CHF (congestive heart failure) (HCC)    Depression    Diabetes mellitus without complication ([place])    type 2   Emphysema of lung (HCC)    GERD (gastroesophageal reflux disease)    Hemorrhoids    History of kidney stones    Cystoscopy   Hyperlipidemia    Hypertension    Hypothyroidism    Obesity    Pulmonary fibrosis (HCC)    Rosacea    Sleep apnea    mild, no need for CPAP   Tubular adenoma of colon 03/26/2019   Colonoscopy repeat in 3 years    Past Surgical History:  Procedure Laterality Date   ABDOMINAL HYSTERECTOMY     ANTERIOR AND POSTERIOR VAGINAL REPAIR  2020   CARDIAC CATHETERIZATION  11/01/2019   CATARACT EXTRACTION W/  INTRAOCULAR LENS IMPLANT Bilateral 2016   COLONOSCOPY     CYSTOSCOPY     DENTAL SURGERY  2016   Dental Implant   FRACTURE SURGERY Left 2013   Left ankle   INTERCOSTAL NERVE BLOCK Right 12/13/2019   Procedure: INTERCOSTAL NERVE BLOCK;  Surgeon: Melrose Nakayama, MD;  Location: [facility];  Service: Thoracic;  Laterality: Right;   LUNG BIOPSY Right 12/13/2019   Procedure: LUNG BIOPSY;  Surgeon: Melrose Nakayama, MD;  Location: [facility];  Service: Thoracic;  Laterality: Right;   LYMPH NODE BIOPSY Right 12/13/2019   Procedure: LYMPH NODE BIOPSY;  Surgeon: Melrose Nakayama,  MD;  Location: [place];  Service: Thoracic;  Laterality: Right;   medtronix nerve stimulator  07/18/2019   Complete InterStim Sacral Neuromudulation system implantation 07/18/19, [facility]   NASAL SINUS SURGERY     RIGHT/LEFT HEART CATH AND CORONARY ANGIOGRAPHY N/A 11/01/2019   Procedure: RIGHT/LEFT HEART CATH AND CORONARY ANGIOGRAPHY;  Surgeon: Martinique, Peter M, MD;  Location: [facility];  Service: Cardiovascular;  Laterality: N/A;   VAGINECTOMY, PARTIAL     VIDEO BRONCHOSCOPY N/A 12/13/2019   Procedure: VIDEO BRONCHOSCOPY;  Surgeon: Melrose Nakayama, MD;  Location: [facility];  Service: Thoracic;  Laterality: N/A;    Current Medications: Current Meds  Medication Sig   amLODipine (NORVASC) 5 MG tablet TAKE ONE TABLET BY MOUTH DAILY   benzonatate (TESSALON) 200 MG capsule Take 1 capsule (200 mg total) by mouth 3 (three) times daily as needed for cough. (Patient not taking: Reported on 12/17/2021)   cetirizine (ZYRTEC) 10 MG tablet Take 10 mg by mouth daily.   FARXIGA 10 MG TABS tablet Take 1 tablet (10 mg total) by mouth daily.   levothyroxine (SYNTHROID) 125 MCG tablet TAKE ONE TABLET BY MOUTH DAILY   montelukast (SINGULAIR) 10 MG tablet TAKE ONE TABLET BY MOUTH EVERY NIGHT AT BEDTIME   Multiple Vitamin (MULTIVITAMIN WITH MINERALS) TABS tablet Take 1 tablet by mouth daily.   omeprazole (PRILOSEC) 20 MG capsule TAKE ONE CAPSULE BY MOUTH DAILY ON AN EMPTY STOMACH   ondansetron (ZOFRAN-ODT) 4 MG disintegrating tablet DISSOLVE ONE TABLET BY MOUTH EVERY 8 HOURS AS NEEDED FOR NAUSEA/ VOMITING   Pirfenidone (ESBRIET) 801 MG TABS Take 801 mg by mouth with breakfast, with lunch, and with evening meal.   rosuvastatin (CRESTOR) 10 MG tablet TAKE ONE TABLET BY MOUTH DAILY   spironolactone (ALDACTONE) 25 MG tablet Take 1 tablet (25 mg total) by mouth daily.   valsartan (DIOVAN) 320 MG tablet Take 1 tablet (320 mg total) by mouth daily.   venlafaxine XR (EFFEXOR-XR) 150 MG 24 hr capsule TAKE ONE CAPSULE  BY MOUTH DAILY   [DISCONTINUED] carvedilol (COREG) 25 MG tablet Take 1 tablet (25 mg total) by mouth 2 (two) times daily.     Allergies:   Ace inhibitors, Clarithromycin, Codeine, Sulfamethoxazole, and Telmisartan   Social History   Tobacco Use   Smoking status: Former    Packs/day: 0.50    Years: 15.00    Total pack years: 7.50    Types: Cigarettes    Quit date: 08/18/2013    Years since quitting: 8.3   Smokeless tobacco: Never  Vaping Use   Vaping Use: Never used  Substance Use Topics   Alcohol use: Not Currently    Comment: 2-3 glasses of wine weekly   Drug use: No     Family History: The patient's family history includes Alcohol abuse in her father; Diabetes in her maternal  grandmother; Drug abuse in her son; Heart disease in her mother; Hypertension in her mother; Osteoporosis in her mother; Stroke in her mother. There is no history of Breast cancer.  ROS:   Please see the history of present illness.   (+) Shortness of breath (+) Fatigue (+) Nasal pruritis All other systems reviewed and are negative.  EKGs/Labs/Other Studies Reviewed:    The following studies were reviewed today:  Chest CT 10/13/2021: IMPRESSION: 1. Unchanged moderate pulmonary fibrosis in a pattern without clear apical to basal gradient, featuring irregular peripheral interstitial opacity, septal thickening, and areas of subpleural bronchiolectasis scattered throughout the lungs, however without evidence of honeycombing. Findings remain consistent with a "probable UIP" pattern and in keeping with reported wedge biopsy diagnosis of UIP/IPF. 2. Coronary artery disease.   Aortic Atherosclerosis (ICD10-I70.0).  Cardiopulmonary Exercise Test  08/26/2021: Conclusion: The interpretation of this test is limited due to submaximal effort during the exercise. Based on available data, exercise testing with gas exchange demonstrates low-normal functional capacity when compared to matched sedentary norms.  There is at least a moderate HF limitation with moderately elevated VE/VCO2 slope and desaturations at peak exercise, which was submaximal effort that can be suggestive of elevated pulmonary pressures.   Bilateral Carotid Doppler 08/02/2021: Summary:  Right Carotid: Velocities in the right ICA are consistent with a 1-39%  stenosis.   Left Carotid: Velocities in the left ICA are consistent with a 1-39%  stenosis.   Vertebrals:  Bilateral vertebral arteries demonstrate antegrade flow.  Subclavians: Normal flow hemodynamics were seen in bilateral subclavian arteries.   Echo 05/26/2021: IMPRESSIONS   1. Left ventricular ejection fraction, by estimation, is 30 to 35%. The  left ventricle has moderately decreased function. The left ventricle  demonstrates global hypokinesis. Left ventricular diastolic parameters are  consistent with Grade I diastolic  dysfunction (impaired relaxation).   2. Right ventricular systolic function is mildly reduced. The right  ventricular size is normal. There is normal pulmonary artery systolic  pressure.   3. Left atrial size was moderately dilated.   4. Right atrial size was mildly dilated.   5. The mitral valve is normal in structure. Trivial mitral valve  regurgitation. No evidence of mitral stenosis.   6. The aortic valve is tricuspid. There is mild calcification of the  aortic valve. Aortic valve regurgitation is not visualized. Aortic valve  sclerosis/calcification is present, without any evidence of aortic  stenosis.   7. The inferior vena cava is normal in size with greater than 50%  respiratory variability, suggesting right atrial pressure of 3 mmHg.   EKG:  EKG is personally reviewed. 12/09/2021: EKG was not ordered.  08/06/2021: NSR, ST T wave abnl anterior.  Imaging studies that I have independently reviewed today: n/a  Recent Labs: 07/22/2021: Pro B Natriuretic peptide (BNP) 51.0 11/02/2021: B Natriuretic Peptide 16.8 12/22/2021: ALT 16; BUN  13; Creatinine, Ser 0.93; Hemoglobin 15.5; Platelets 236; Potassium 4.2; Sodium 140   Recent Lipid Panel    Component Value Date/Time   CHOL 159 12/11/2020 1208   TRIG 163 (H) 12/11/2020 1208   HDL 52 12/11/2020 1208   CHOLHDL 3.1 12/11/2020 1208   VLDL 28.4 07/19/2019 1333   LDLCALC 81 12/11/2020 1208   LDLDIRECT 146.7 05/29/2006 1115    Physical Exam:    VS:  BP 110/64 (BP Location: Left Arm, Patient Position: Sitting, Cuff Size: Normal)   Pulse 75   Ht 5' 7" (1.702 m)   Wt 197 lb (89.4 kg)  BMI 30.85 kg/m     Wt Readings from Last 5 Encounters:  12/30/21 190 lb (86.2 kg)  12/22/21 197 lb (89.4 kg)  12/09/21 197 lb (89.4 kg)  11/02/21 195 lb 6.4 oz (88.6 kg)  08/06/21 199 lb 3.2 oz (90.4 kg)    Constitutional: No acute distress Eyes: sclera non-icteric, normal conjunctiva and lids ENMT: normal dentition, moist mucous membranes Cardiovascular: regular rhythm, normal rate, no murmur. S1 and S2 normal. No jugular venous distention.  Respiratory: relatively clear today. GI : normal bowel sounds, soft and nontender. No distention.   MSK: extremities warm, well perfused. No edema.  NEURO: grossly nonfocal exam, moves all extremities. PSYCH: alert and oriented x 3, normal mood and affect.   ASSESSMENT:    1. Chronic systolic heart failure ([place])   2. Nonischemic cardiomyopathy ([place])   3. Essential hypertension   4. IPF (idiopathic pulmonary fibrosis) ([place])   5. Medication management   6. Mixed hyperlipidemia     PLAN:    Chronic combined systolic and diastolic heart failure Nonischemic cardiomyopathy (HCC) Essential hypertension - CPX: Markedly submax effort suggesting significant deconditioning/decreased exercise tolerance. Based on available data, exercise capacity is at least moderately reduced with evidence of a significant circulatory limitation with significantly elevated VE/VCo2 slope. There is also marked chronotropic incompetence. Resting PFTs show  restrictive physiology with sats dropping to 91% early in exercise.  - She is intending to undergo Barostim.  -EF is approximately 41% on MRI.  We discussed heart failure appt recommendations. -Heart Failure Therapy  ACE-I/ARB/ARNI: Valsartan 320 mg daily BB: carvedilol 25 mg BID, transitioned from toprol xl for better BP control. She may not have been taking this more recently at recommended BID dosing, will resume. Marked chronotropic incompetence on CPX, we may need to decrease BB effect and preferentially increase antihypertensives for BP control with less chronotropic effect. Will review after barostim.  MRA:  spironolactone 60m daily, decreased due to renal function. SGLT2I: farxiga 10 mg daily Loop diuretic: lasix 40 mg prn Also on amlodipine 5 mg daily for HTN. BP normal today.   CAD, nonobstructive - cont asa and statin Mixed hyperlipidemia - well treated with crestor 10 mg daily.    Follow-up in 6 months.  Total time of encounter: 30 minutes total time of encounter, including 20 minutes spent in face-to-face patient care on the date of this encounter. This time includes coordination of care and counseling regarding above mentioned problem list. Remainder of non-face-to-face time involved reviewing chart documents/testing relevant to the patient encounter and documentation in the medical record. I have independently reviewed documentation from referring provider.   GCherlynn Kaiser MD, FThe Colony  Shared Decision Making/Informed Consent:       Medication Adjustments/Labs and Tests Ordered: Current medicines are reviewed at length with the patient today.  Concerns regarding medicines are outlined above.   No orders of the defined types were placed in this encounter.  Meds ordered this encounter  Medications   carvedilol (COREG) 25 MG tablet    Sig: Take 1 tablet (25 mg total) by mouth 2 (two) times daily.    Dispense:  180 tablet    Refill:  3     Stop metoprolol   Patient Instructions  Medication Instructions:  No Changes In Medications at this time.  *If you need a refill on your cardiac medications before your next appointment, please call your pharmacy*  Follow-Up: [facility] and your health needs are  our priority.  As part of our continuing mission to provide you with exceptional heart care, we have created designated Provider Care Teams.  These Care Teams include your primary Cardiologist (physician) and Advanced Practice Providers (APPs -  Physician Assistants and Nurse Practitioners) who all work together to provide you with the care you need, when you need it.  Your next appointment:   6 month(s)  The format for your next appointment:   In Person  Provider:   Elouise Munroe, MD            [facility],acting as a scribe for Elouise Munroe, MD.,have documented all relevant documentation on the behalf of Elouise Munroe, MD,as directed by  Elouise Munroe, MD while in the presence of Elouise Munroe, MD.   I, Elouise Munroe, MD, have reviewed all documentation for the visit on 12/09/2021. The documentation on today's date of service for the exam, diagnosis, procedures, and orders are all accurate and complete.

## 2021-12-09 NOTE — Patient Instructions (Signed)
Medication Instructions:  No Changes In Medications at this time.  *If you need a refill on your cardiac medications before your next appointment, please call your pharmacy*  Follow-Up: At [facility], you and your health needs are our priority.  As part of our continuing mission to provide you with exceptional heart care, we have created designated Provider Care Teams.  These Care Teams include your primary Cardiologist (physician) and Advanced Practice Providers (APPs -  Physician Assistants and Nurse Practitioners) who all work together to provide you with the care you need, when you need it.  Your next appointment:   6 month(s)  The format for your next appointment:   In Person  Provider:   Elouise Munroe, MD

## 2021-12-10 ENCOUNTER — Telehealth: Payer: Self-pay | Admitting: *Deleted

## 2021-12-10 NOTE — Patient Outreach (Signed)
  Care Coordination   12/10/2021 Name: Susan Hunt MRN: [id number] DOB: 02/18/1946   Care Coordination Outreach Attempts:  A second unsuccessful outreach was attempted today to offer the patient with information about available care coordination services as a benefit of their health plan.     Follow Up Plan:  Additional outreach attempts will be made to offer the patient care coordination information and services.   Encounter Outcome:  No Answer  Care Coordination Interventions Activated:  No   Care Coordination Interventions:  No, not indicated    Raina Mina, RN Care Management Coordinator [place] Office [phone]

## 2021-12-21 NOTE — Pre-Procedure Instructions (Signed)
Surgical Instructions    Your procedure is scheduled on Thursday, October 19th.  Report to [facility] Main Entrance "A" at 05:30 A.M., then check in with the Admitting office.  Call this number if you have problems the morning of surgery:  [phone]   If you have any questions prior to your surgery date call [phone]: Open Monday-Friday 8am-4pm    Remember:  Do not eat or drink after midnight the night before your surgery     Take these medicines the morning of surgery with A SIP OF WATER  amLODipine (NORVASC) carvedilol (COREG)  cetirizine (ZYRTEC)  levothyroxine (SYNTHROID)  omeprazole (PRILOSEC)  Pirfenidone (ESBRIET) rosuvastatin (CRESTOR) venlafaxine XR (EFFEXOR-XR)    If needed: ondansetron (ZOFRAN-ODT)  As of today, STOP taking any Aspirin (unless otherwise instructed by your surgeon) Aleve, Naproxen, Ibuprofen, Motrin, Advil, Goody's, BC's, all herbal medications, fish oil, and all vitamins.  WHAT DO I DO ABOUT MY DIABETES MEDICATION?   Hold Farxiga 72 hours prior to surgery. Last dose 10/15.     HOW TO MANAGE YOUR DIABETES BEFORE AND AFTER SURGERY  Why is it important to control my blood sugar before and after surgery? Improving blood sugar levels before and after surgery helps healing and can limit problems. A way of improving blood sugar control is eating a healthy diet by:  Eating less sugar and carbohydrates  Increasing activity/exercise  Talking with your doctor about reaching your blood sugar goals High blood sugars (greater than 180 mg/dL) can raise your risk of infections and slow your recovery, so you will need to focus on controlling your diabetes during the weeks before surgery. Make sure that the doctor who takes care of your diabetes knows about your planned surgery including the date and location.  How do I manage my blood sugar before surgery? Check your blood sugar at least 4 times a day, starting 2 days before surgery, to make sure  that the level is not too high or low.  Check your blood sugar the morning of your surgery when you wake up and every 2 hours until you get to the Short Stay unit.  If your blood sugar is less than 70 mg/dL, you will need to treat for low blood sugar: Do not take insulin. Treat a low blood sugar (less than 70 mg/dL) with  cup of clear juice (cranberry or apple), 4 glucose tablets, OR glucose gel. Recheck blood sugar in 15 minutes after treatment (to make sure it is greater than 70 mg/dL). If your blood sugar is not greater than 70 mg/dL on recheck, call [phone] for further instructions. Report your blood sugar to the short stay nurse when you get to Short Stay.  If you are admitted to the hospital after surgery: Your blood sugar will be checked by the staff and you will probably be given insulin after surgery (instead of oral diabetes medicines) to make sure you have good blood sugar levels. The goal for blood sugar control after surgery is 80-180 mg/dL.                    Do NOT Smoke (Tobacco/Vaping) for 24 hours prior to your procedure.  If you use a CPAP at night, you may bring your mask/headgear for your overnight stay.   Contacts, glasses, piercing's, hearing aid's, dentures or partials may not be worn into surgery, please bring cases for these belongings.    For patients admitted to the hospital, discharge time will be determined by your treatment  team.   Patients discharged the day of surgery will not be allowed to drive home, and someone needs to stay with them for 24 hours.  SURGICAL WAITING ROOM VISITATION Patients having surgery or a procedure may have no more than 2 support people in the waiting area - these visitors may rotate.   Children under the age of 12 must have an adult with them who is not the patient. If the patient needs to stay at the hospital during part of their recovery, the visitor guidelines for inpatient rooms apply. Pre-op nurse will coordinate an  appropriate time for 1 support person to accompany patient in pre-op.  This support person may not rotate.   Please refer to the [facility] website for the visitor guidelines for Inpatients (after your surgery is over and you are in a regular room).    Special instructions:   [place]- Preparing For Surgery  Before surgery, you can play an important role. Because skin is not sterile, your skin needs to be as free of germs as possible. You can reduce the number of germs on your skin by washing with CHG (chlorahexidine gluconate) Soap before surgery.  CHG is an antiseptic cleaner which kills germs and bonds with the skin to continue killing germs even after washing.    Oral Hygiene is also important to reduce your risk of infection.  Remember - BRUSH YOUR TEETH THE MORNING OF SURGERY WITH YOUR REGULAR TOOTHPASTE  Please do not use if you have an allergy to CHG or antibacterial soaps. If your skin becomes reddened/irritated stop using the CHG.  Do not shave (including legs and underarms) for at least 48 hours prior to first CHG shower. It is OK to shave your face.  Please follow these instructions carefully.   Shower the NIGHT BEFORE SURGERY and the MORNING OF SURGERY  If you chose to wash your hair, wash your hair first as usual with your normal shampoo.  After you shampoo, rinse your hair and body thoroughly to remove the shampoo.  Use CHG Soap as you would any other liquid soap. You can apply CHG directly to the skin and wash gently with a scrungie or a clean washcloth.   Apply the CHG Soap to your body ONLY FROM THE NECK DOWN.  Do not use on open wounds or open sores. Avoid contact with your eyes, ears, mouth and genitals (private parts). Wash Face and genitals (private parts)  with your normal soap.   Wash thoroughly, paying special attention to the area where your surgery will be performed.  Thoroughly rinse your body with warm water from the neck down.  DO NOT shower/wash with  your normal soap after using and rinsing off the CHG Soap.  Pat yourself dry with a CLEAN TOWEL.  Wear CLEAN PAJAMAS to bed the night before surgery  Place CLEAN SHEETS on your bed the night before your surgery  DO NOT SLEEP WITH PETS.   Day of Surgery: Take a shower with CHG soap. Do not wear jewelry or makeup Do not wear lotions, powders, perfumes, or deodorant. Do not shave 48 hours prior to surgery.   Do not bring valuables to the hospital. [facility] is not responsible for any belongings or valuables. Do not wear nail polish, gel polish, artificial nails, or any other type of covering on natural nails (fingers and toes) If you have artificial nails or gel coating that need to be removed by a nail salon, please have this removed prior to  surgery. Artificial nails or gel coating may interfere with anesthesia's ability to adequately monitor your vital signs. Wear Clean/Comfortable clothing the morning of surgery Remember to brush your teeth WITH YOUR REGULAR TOOTHPASTE.   Please read over the following fact sheets that you were given.    If you received a COVID test during your pre-op visit  it is requested that you wear a mask when out in public, stay away from anyone that may not be feeling well and notify your surgeon if you develop symptoms. If you have been in contact with anyone that has tested positive in the last 10 days please notify you surgeon.

## 2021-12-22 ENCOUNTER — Encounter (HOSPITAL_COMMUNITY): Payer: Self-pay

## 2021-12-22 ENCOUNTER — Telehealth: Payer: Self-pay

## 2021-12-22 ENCOUNTER — Other Ambulatory Visit: Payer: Self-pay

## 2021-12-22 ENCOUNTER — Encounter (HOSPITAL_COMMUNITY)
Admission: RE | Admit: 2021-12-22 | Discharge: 2021-12-22 | Disposition: A | Discharge status: Home / Self Care | Payer: Medicare Other | Source: Ambulatory Visit | Attending: Surgery | Admitting: Surgery

## 2021-12-22 VITALS — BP 157/81 | HR 74 | Temp 97.8°F | Resp 18 | Ht 67.0 in | Wt 197.0 lb

## 2021-12-22 DIAGNOSIS — E119 Type 2 diabetes mellitus without complications: Secondary | ICD-10-CM | POA: Diagnosis not present

## 2021-12-22 DIAGNOSIS — I5022 Chronic systolic (congestive) heart failure: Secondary | ICD-10-CM

## 2021-12-22 DIAGNOSIS — Z01818 Encounter for other preprocedural examination: Secondary | ICD-10-CM | POA: Diagnosis present

## 2021-12-22 DIAGNOSIS — Z01812 Encounter for preprocedural laboratory examination: Secondary | ICD-10-CM | POA: Diagnosis not present

## 2021-12-22 HISTORY — DX: Pulmonary fibrosis, unspecified: J84.10

## 2021-12-22 HISTORY — DX: Type 2 diabetes mellitus without complications: E11.9

## 2021-12-22 HISTORY — DX: Sleep apnea, unspecified: G47.30

## 2021-12-22 LAB — GLUCOSE, CAPILLARY: Glucose-Capillary: 106 mg/dL — ABNORMAL HIGH (ref 70–99)

## 2021-12-22 LAB — CBC
HCT: 46.3 % — ABNORMAL HIGH (ref 36.0–46.0)
Hemoglobin: 15.5 g/dL — ABNORMAL HIGH (ref 12.0–15.0)
MCH: 31.6 pg (ref 26.0–34.0)
MCHC: 33.5 g/dL (ref 30.0–36.0)
MCV: 94.5 fL (ref 80.0–100.0)
Platelets: 236 10*3/uL (ref 150–400)
RBC: 4.9 MIL/uL (ref 3.87–5.11)
RDW: 11.7 % (ref 11.5–15.5)
WBC: 9.8 10*3/uL (ref 4.0–10.5)
nRBC: 0 % (ref 0.0–0.2)

## 2021-12-22 LAB — HEMOGLOBIN A1C
Hgb A1c MFr Bld: 6.3 % — ABNORMAL HIGH (ref 4.8–5.6)
Mean Plasma Glucose: 134.11 mg/dL

## 2021-12-22 LAB — COMPREHENSIVE METABOLIC PANEL
ALT: 16 U/L (ref 0–44)
AST: 24 U/L (ref 15–41)
Albumin: 4 g/dL (ref 3.5–5.0)
Alkaline Phosphatase: 71 U/L (ref 38–126)
Anion gap: 5 (ref 5–15)
BUN: 13 mg/dL (ref 8–23)
CO2: 25 mmol/L (ref 22–32)
Calcium: 8.9 mg/dL (ref 8.9–10.3)
Chloride: 110 mmol/L (ref 98–111)
Creatinine, Ser: 0.93 mg/dL (ref 0.44–1.00)
GFR, Estimated: >60 mL/min (ref >60)
Glucose, Bld: 96 mg/dL (ref 70–99)
Potassium: 4.2 mmol/L (ref 3.5–5.1)
Sodium: 140 mmol/L (ref 135–145)
Total Bilirubin: 0.6 mg/dL (ref 0.3–1.2)
Total Protein: 7.2 g/dL (ref 6.5–8.1)

## 2021-12-22 LAB — URINALYSIS, ROUTINE W REFLEX MICROSCOPIC
Bacteria, UA: NONE SEEN
Bilirubin Urine: NEGATIVE
Glucose, UA: >=500 mg/dL — AB
Hgb urine dipstick: NEGATIVE
Ketones, ur: NEGATIVE mg/dL
Leukocytes,Ua: NEGATIVE
Nitrite: NEGATIVE
Protein, ur: NEGATIVE mg/dL
Specific Gravity, Urine: 1.021 (ref 1.005–1.030)
pH: 5 (ref 5.0–8.0)

## 2021-12-22 LAB — APTT: aPTT: 34 seconds (ref 24–36)

## 2021-12-22 LAB — TYPE AND SCREEN
ABO/RH(D): O POS
Antibody Screen: NEGATIVE

## 2021-12-22 LAB — SURGICAL PCR SCREEN
MRSA, PCR: NEGATIVE
Staphylococcus aureus: NEGATIVE

## 2021-12-22 LAB — PROTIME-INR
INR: 1.1 (ref 0.8–1.2)
Prothrombin Time: 13.8 seconds (ref 11.4–15.2)

## 2021-12-22 NOTE — Patient Instructions (Signed)
Visit Information  Thank you for taking time to visit with me today. Please don't hesitate to contact me if I can be of assistance to you.   Following are the goals we discussed today:   Goals Addressed             This Visit's Progress    COMPLETED: Care Coordination Activities - no follow up required       Care Coordination Interventions: Provided education to patient re: Annual Wellness Visit, care coordination services Assessed social determinant of health barriers          If you are experiencing a Mental Health or [place] or need someone to talk to, please call the Suicide and Crisis Lifeline: 988 call the [place] National Suicide Prevention Lifeline: 660-178-2347 or TTY: (986)183-8103 TTY 938-531-9017) to talk to a trained counselor call 1-800-273-TALK (toll free, 24 hour hotline) go to [facility] [address], [place] [phone]) call 911   Patient verbalizes understanding of instructions and care plan provided today and agrees to view in [place]. Active MyChart status and patient understanding of how to access instructions and care plan via MyChart confirmed with patient.     No further follow up required:    Peter Garter RN, Jackquline [place], [place] [facility] [phone]

## 2021-12-22 NOTE — Progress Notes (Signed)
PCP - Dr. Billey Chang Cardiologist - Dr. Cherlynn Kaiser HF: Dr. Haroldine Laws  PPM/ICD - denies   Chest x-ray - 01/04/21 EKG - 08/06/21 Stress Test - 08/26/21 ECHO - 05/26/21 Cardiac Cath - 11/01/19  Sleep Study - 2023, mild OSA per pt. No need for CPAP CPAP - denies  DM- Type 2 Pt does not check CBG   Last dose of GLP1 agonist-  n/a   ASA/Blood Thinner Instructions: n/a   ERAS Protcol - no, NPO   COVID TEST- n/a   Anesthesia review: yes, cardiac hx  Patient denies shortness of breath, fever, cough and chest pain at PAT appointment   All instructions explained to the patient, with a verbal understanding of the material. Patient agrees to go over the instructions while at home for a better understanding. The opportunity to ask questions was provided.

## 2021-12-22 NOTE — Patient Outreach (Signed)
  Care Coordination   Initial Visit Note   12/22/2021 Name: Susan Hunt MRN: [id number] DOB: March 10, 1946  Susan Hunt is a 76 y.o. year old female who sees Leamon Arnt, MD for primary care. I spoke with  [name] by phone today.  What matters to the patients health and wellness today?  No concerns today.  Declines Annual Wellness Visit    Goals Addressed             This Visit's Progress    COMPLETED: Care Coordination Activities - no follow up required       Care Coordination Interventions: Provided education to patient re: Annual Wellness Visit, care coordination services Assessed social determinant of health barriers           SDOH assessments and interventions completed:  Yes  SDOH Interventions Today    Flowsheet Row Most Recent Value  SDOH Interventions   Food Insecurity Interventions Intervention Not Indicated  Housing Interventions Intervention Not Indicated  Transportation Interventions Intervention Not Indicated  Utilities Interventions Intervention Not Indicated        Care Coordination Interventions Activated:  Yes  Care Coordination Interventions:  Yes, provided   Follow up plan: No further intervention required.   Encounter Outcome:  Pt. Visit Completed  Peter Garter RN, BSN,CCM, CDE Care Management Coordinator [place] [facility] [phone]

## 2021-12-23 NOTE — Progress Notes (Signed)
Anesthesia Chart Review:  Case: [id number] Date/Time: 12/30/21 0715   Procedure: INSERTION OF [name] (Right)   Anesthesia type: General   Pre-op diagnosis: Congestive heart failure   Location: [facility] OR ROOM 11 / [place] OR   Surgeons: Serafina Mitchell, MD       DISCUSSION: Patient is a 76 year old female scheduled for the above procedure.  History includes former smoker (quit 08/18/13), non-ischemic cardiomyopathy (unclear etiology; non-obstructive CAD, EF 35-45% 11/01/19), HFrEF (EF 30-35% 09/2019 % 05/2021; ICD not recommended by EP 06/2021), HTN, HLD, DM2, hypothyroidism, emphysema, idiopathic pulmonary fibrosis (IFP, based on 12/13/19 lung biopsy; uses O2 2L as needed), GERD, refractory bowel leakage (s/p Complete InterStim Sacral Neuromodulation system implantation 07/18/19, [facility]), septoplasty with bilateral inferior turbinate reductions (08/08/06), anemia, OSA (mild 05/2021, no CPAP), COVID-19 (10/2021, s/p Paxlovid).  Anesthesia team to evaluate on the day of surgery.   VS: BP (!) 157/81   Pulse 74   Temp 36.6 C (Oral)   Resp 18   Ht _0  (1.702 m)   Wt 89.4 kg   SpO2 96%   BMI 30.85 kg/m    PROVIDERS: Leamon Arnt, MD is PCP  - Cherlynn Kaiser, MD is cardiologist. Last visit 08/06/21. Glori Bickers, MD is HF cardiologist. Last visit 11/02/21 with Allena Katz, [place]. Patient was awaiting Biochemist, clinical for [employer].  Virl Axe, MD is EP cardiologist. Per 06/25/21 note, the role of ICD in an older non-ischemic person in not so clear. She also significant comorbidities with her ILD. At that time, he "would not pursue an ICD for primary prevention but would continue aggressive medical therapies which have been shown to be beneficial." - Blair Dolphin, MD is pulmonologist. Last visit 08/04/21.  Overall fibrosis felt stable over the past year.  On pirfenidone. - Rockey Situ, MD is uro-gynecologist ([place])   LABS: Labs reviewed: Acceptable for  surgery. (all labs ordered are listed, but only abnormal results are displayed)  Labs Reviewed  CBC - Abnormal; Notable for the following components:      Result Value   Hemoglobin 15.5 (*)    HCT 46.3 (*)    All other components within normal limits  URINALYSIS, ROUTINE W REFLEX MICROSCOPIC - Abnormal; Notable for the following components:   Glucose, UA >=500 (*)    All other components within normal limits  HEMOGLOBIN A1C - Abnormal; Notable for the following components:   Hgb A1c MFr Bld 6.3 (*)    All other components within normal limits  GLUCOSE, CAPILLARY - Abnormal; Notable for the following components:   Glucose-Capillary 106 (*)    All other components within normal limits  SURGICAL PCR SCREEN  COMPREHENSIVE METABOLIC PANEL  PROTIME-INR  APTT  TYPE AND SCREEN    OTHER: PFTs 07/22/21: FVC 2.05 (65%) FEV1 1.77 (74%) DLCO unc/cor 12.27 (58%) Conclusions: The diffusion defect, increased FEV1/FVC ratio and reduced FVC suggest an early interstitial process. In view of the severity of the diffusion defect, studies with exercise would be helpful to evaluate the presence of hypoxemia. Pulmonary Function Diagnosis: Mild Restriction -Interstitial Moderate Diffusion Defect  Sleep Study 06/01/21:  FINDINGS: 1. Mild Obstructive Sleep Apnea with AHI 13.5/hr. 2. No Central Sleep Apnea with pAHIc 1.2/hr. 3. Oxygen desaturations as low as 87%. 4. Severe snoring was present. O2 sats were < 88% for 0.5 min. 5. Total sleep time was hrs and min. 6. 16.5% of total sleep time was spent in REM sleep. 7. Normal sleep onset latency  at 16 min. 8. Shortened REM sleep onset latency at 47 min. 9. Total awakenings were 11.     IMAGES: CT Chest High Resolution 10/13/21: IMPRESSION: 1. Unchanged moderate pulmonary fibrosis in a pattern without clear apical to basal gradient, featuring irregular peripheral interstitial opacity, septal thickening, and areas of subpleural bronchiolectasis  scattered throughout the lungs, however without evidence of honeycombing. Findings remain consistent with a "probable UIP" pattern and in keeping with reported wedge biopsy diagnosis of UIP/IPF. 2. Coronary artery disease. Aortic Atherosclerosis (ICD10-I70.0).    EKG: 08/06/21 [facility]): Sinus rhythm with occasional premature ventricular complexes.  ST and T wave abnormality, consider anterior ischemia.   CV: CPX 08/26/21: Conclusion: The interpretation of this test is limited due to submaximal effort during the exercise. Based on available data, exercise testing with gas exchange demonstrates low-normal functional capacity when compared to matched sedentary norms. There is at least a moderate HF limitation with moderately elevated VE/VCO2 slope and desaturations at peak exercise, which was submaximal effort that can be suggestive of elevated pulmonary pressures. (Preliminary impression by: Kathy Breach, MS, ACSM-RCEP) - Attending Glori Bickers, MD): Markedly submax effort suggesting significant deconditioning/decreased exercise tolerance. Based on available data, exercise capacity is at least moderately reduced with evidence of a significant circulatory limitation with significantly elevated VE/VCo2 slope. There is also marked chronotropic incompetence. Resting PFTs show restrictive physiology with sats dropping to 91% early in exercise.    US Carotid 08/02/21: Summary:  - Right Carotid: Velocities in the right ICA are consistent with a 1-39%  stenosis.  - Left Carotid: Velocities in the left ICA are consistent with a 1-39%  stenosis.  - Vertebrals:  Bilateral vertebral arteries demonstrate antegrade flow.  - Subclavians: Normal flow hemodynamics were seen in bilateral subclavian arteries.    Echo 05/26/21: IMPRESSIONS   1. Left ventricular ejection fraction, by estimation, is 30 to 35%. The  left ventricle has moderately decreased function. The left ventricle  demonstrates  global hypokinesis. Left ventricular diastolic parameters are  consistent with Grade I diastolic  dysfunction (impaired relaxation).   2. Right ventricular systolic function is mildly reduced. The right  ventricular size is normal. There is normal pulmonary artery systolic  pressure.   3. Left atrial size was moderately dilated.   4. Right atrial size was mildly dilated.   5. The mitral valve is normal in structure. Trivial mitral valve  regurgitation. No evidence of mitral stenosis.   6. The aortic valve is tricuspid. There is mild calcification of the  aortic valve. Aortic valve regurgitation is not visualized. Aortic valve  sclerosis/calcification is present, without any evidence of aortic  stenosis.   7. The inferior vena cava is normal in size with greater than 50%  respiratory variability, suggesting right atrial pressure of 3 mmHg.  - Comparison 02/24/20: LVEF 30-35%, LV global hypokinesis, grade I DD, normal RVSF, normal PASP, mild MR; 10/02/19: LVEF 30-35%.   Cardiac cath 11/01/19: Prox LAD to Mid LAD lesion is 30% stenosed. Ost Cx to Prox Cx lesion is 35% stenosed. Prox RCA lesion is 35% stenosed. There is moderate left ventricular systolic dysfunction. LV end diastolic pressure is normal. The left ventricular ejection fraction is 35-45% by visual estimate. 1. Nonobstructive CAD 2. Moderate LV dysfunction. EF estimated at 40% with global hypokinesis 3. Normal LV filling pressures 4. Normal right heart pressures 5. Normal cardiac output.  Plan: medical therapy for LV dysfunction and lipid lowering therapy for nonobstructive CAD.    Past Medical History:  Diagnosis Date   Anemia    Asthma    "I dont think I have it.   CHF (congestive heart failure) (HCC)    Depression    Diabetes mellitus without complication ([place])    type 2   Emphysema of lung (HCC)    GERD (gastroesophageal reflux disease)    Hemorrhoids    History of kidney stones    Cystoscopy    Hyperlipidemia    Hypertension    Hypothyroidism    Obesity    Pulmonary fibrosis (HCC)    Rosacea    Sleep apnea    mild, no need for CPAP   Tubular adenoma of colon 03/26/2019   Colonoscopy repeat in 3 years    Past Surgical History:  Procedure Laterality Date   ABDOMINAL HYSTERECTOMY     ANTERIOR AND POSTERIOR VAGINAL REPAIR  2020   CARDIAC CATHETERIZATION  11/01/2019   CATARACT EXTRACTION W/ INTRAOCULAR LENS IMPLANT Bilateral 2016   COLONOSCOPY     CYSTOSCOPY     DENTAL SURGERY  2016   Dental Implant   FRACTURE SURGERY Left 2013   Left ankle   INTERCOSTAL NERVE BLOCK Right 12/13/2019   Procedure: INTERCOSTAL NERVE BLOCK;  Surgeon: Melrose Nakayama, MD;  Location: [facility];  Service: Thoracic;  Laterality: Right;   LUNG BIOPSY Right 12/13/2019   Procedure: LUNG BIOPSY;  Surgeon: Melrose Nakayama, MD;  Location: [place];  Service: Thoracic;  Laterality: Right;   LYMPH NODE BIOPSY Right 12/13/2019   Procedure: LYMPH NODE BIOPSY;  Surgeon: Melrose Nakayama, MD;  Location: [place];  Service: Thoracic;  Laterality: Right;   medtronix nerve stimulator  07/18/2019   Complete InterStim Sacral Neuromudulation system implantation 07/18/19, [facility]   NASAL SINUS SURGERY     RIGHT/LEFT HEART CATH AND CORONARY ANGIOGRAPHY N/A 11/01/2019   Procedure: RIGHT/LEFT HEART CATH AND CORONARY ANGIOGRAPHY;  Surgeon: Martinique, Peter M, MD;  Location: [place] CV LAB;  Service: Cardiovascular;  Laterality: N/A;   VAGINECTOMY, PARTIAL     VIDEO BRONCHOSCOPY N/A 12/13/2019   Procedure: VIDEO BRONCHOSCOPY;  Surgeon: Melrose Nakayama, MD;  [facility];  Service: Thoracic;  Laterality: N/A;    MEDICATIONS:  amLODipine (NORVASC) 5 MG tablet   benzonatate (TESSALON) 200 MG capsule   carvedilol (COREG) 25 MG tablet   cetirizine (ZYRTEC) 10 MG tablet   FARXIGA 10 MG TABS tablet   levothyroxine (SYNTHROID) 125 MCG tablet   montelukast (SINGULAIR) 10 MG tablet   Multiple Vitamin  (MULTIVITAMIN WITH MINERALS) TABS tablet   omeprazole (PRILOSEC) 20 MG capsule   ondansetron (ZOFRAN-ODT) 4 MG disintegrating tablet   Pirfenidone (ESBRIET) 801 MG TABS   rosuvastatin (CRESTOR) 10 MG tablet   spironolactone (ALDACTONE) 25 MG tablet   valsartan (DIOVAN) 320 MG tablet   venlafaxine XR (EFFEXOR-XR) 150 MG 24 hr capsule   No current facility-administered medications for this encounter.  Last Farxiga scheduled for 12/26/21 prior to procedure.    Myra Gianotti, PA-C Surgical Short Stay/Anesthesiology [facility] Phone [phone] [facility] Phone [phone] 12/23/2021 5:23 PM

## 2021-12-23 NOTE — Anesthesia Preprocedure Evaluation (Addendum)
Anesthesia Evaluation  Patient identified by MRN, date of birth, ID band Patient awake    Reviewed: Allergy & Precautions, NPO status , Patient's Chart, lab work & pertinent test results  Airway Mallampati: II  TM Distance: >3 FB Neck ROM: Full    Dental no notable dental hx.    Pulmonary asthma , sleep apnea , COPD (2L PRN),  oxygen dependent, former smoker,    Pulmonary exam normal        Cardiovascular hypertension, Pt. on medications + CAD and +CHF   Rhythm:Regular Rate:Normal  ECHO: 1. Left ventricular ejection fraction, by estimation, is 30 to 35%. The  left ventricle has moderately decreased function. The left ventricle  demonstrates global hypokinesis. Left ventricular diastolic parameters are  consistent with Grade I diastolic  dysfunction (impaired relaxation).  2. Right ventricular systolic function is mildly reduced. The right  ventricular size is normal. There is normal pulmonary artery systolic  pressure.  3. Left atrial size was moderately dilated.  4. Right atrial size was mildly dilated.  5. The mitral valve is normal in structure. Trivial mitral valve  regurgitation. No evidence of mitral stenosis.  6. The aortic valve is tricuspid. There is mild calcification of the  aortic valve. Aortic valve regurgitation is not visualized. Aortic valve  sclerosis/calcification is present, without any evidence of aortic  stenosis.  7. The inferior vena cava is normal in size with greater than 50%  respiratory variability, suggesting right atrial pressure of 3 mmHg.    Neuro/Psych Depression negative neurological ROS     GI/Hepatic Neg liver ROS, GERD  Medicated,  Endo/Other  diabetes, Type 2Hypothyroidism   Renal/GU      Musculoskeletal   Abdominal Normal abdominal exam  (+)   Peds  Hematology  (+) Blood dyscrasia, anemia , Lab Results      Component                Value               Date                       WBC                      9.8                 12/22/2021                HGB                      15.5 (H)            12/22/2021                HCT                      46.3 (H)            12/22/2021                MCV                      94.5                12/22/2021                PLT  236                 12/22/2021           Lab Results      Component                Value               Date                      NA                       140                 12/22/2021                K                        4.2                 12/22/2021                CO2                      25                  12/22/2021                GLUCOSE                  96                  12/22/2021                BUN                      13                  12/22/2021                CREATININE               0.93                12/22/2021                CALCIUM                  8.9                 12/22/2021                EGFR                     52 (L)              02/03/2021                GFRNONAA                 >60                 12/22/2021             Anesthesia Other Findings   Reproductive/Obstetrics                           Anesthesia Physical Anesthesia Plan  ASA:  4  Anesthesia Plan: General   Post-op Pain Management:    Induction: Intravenous  PONV Risk Score and Plan: 3 and Ondansetron, Dexamethasone and Treatment may vary due to age or medical condition  Airway Management Planned: Mask and Oral ETT  Additional Equipment: Arterial line  Intra-op Plan:   Post-operative Plan: Extubation in OR  Informed Consent: I have reviewed the patients History and Physical, chart, labs and discussed the procedure including the risks, benefits and alternatives for the proposed anesthesia with the patient or authorized representative who has indicated his/her understanding and acceptance.     Dental advisory given  Plan Discussed with:  CRNA  Anesthesia Plan Comments: (PAT note written 12/23/2021 by Shonna Chock, PA-C. )      Anesthesia Quick Evaluation

## 2021-12-28 IMAGING — CR DG CHEST 2V
2 series · 2 of 2 positions shown · non-contrast
Comparison: High-resolution chest CT 09/04/2019 and chest
radiographs 07/19/2019

CLINICAL DATA: Interstitial lung disease.

EXAM:
CHEST - 2 VIEW

[w chest pa]
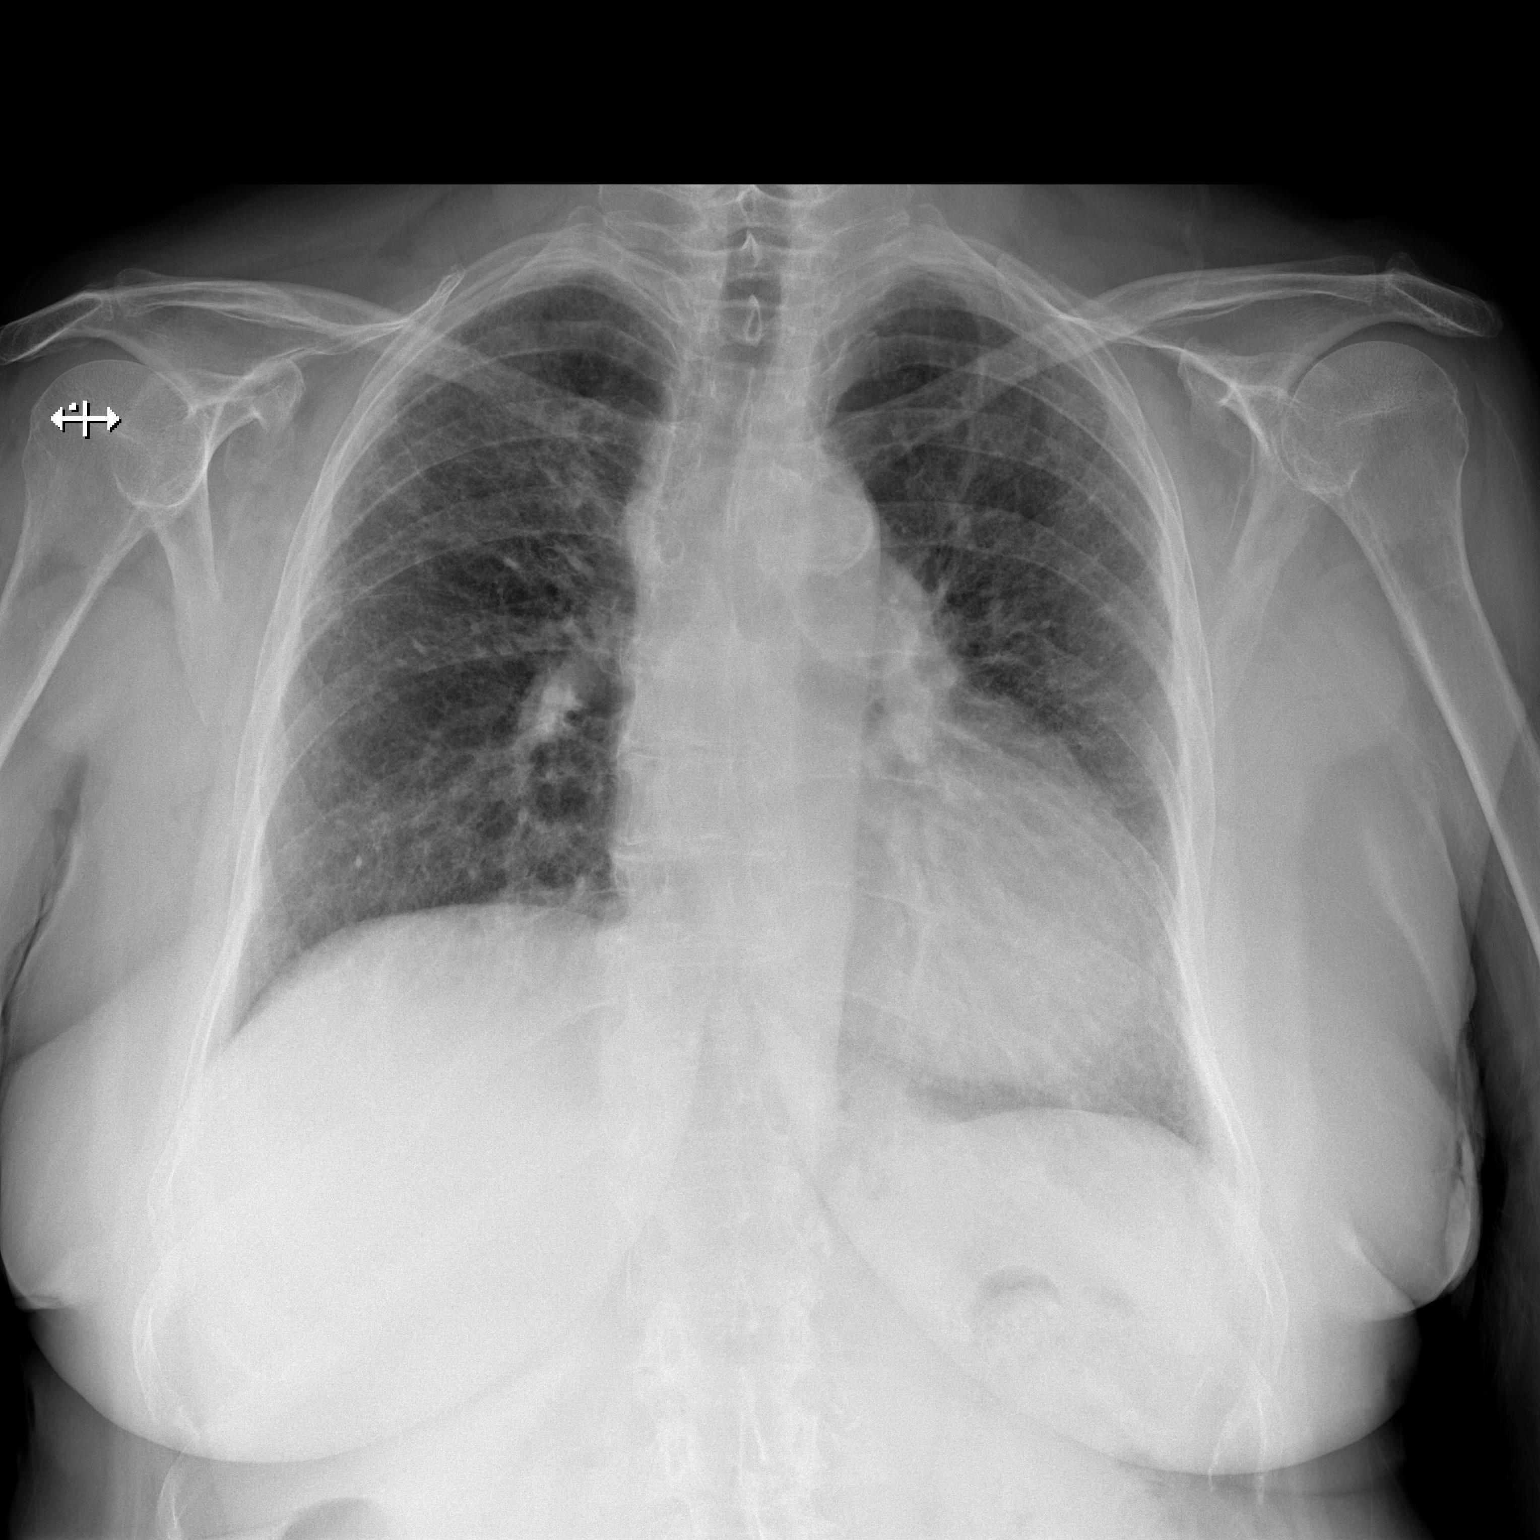

[w chest lat]
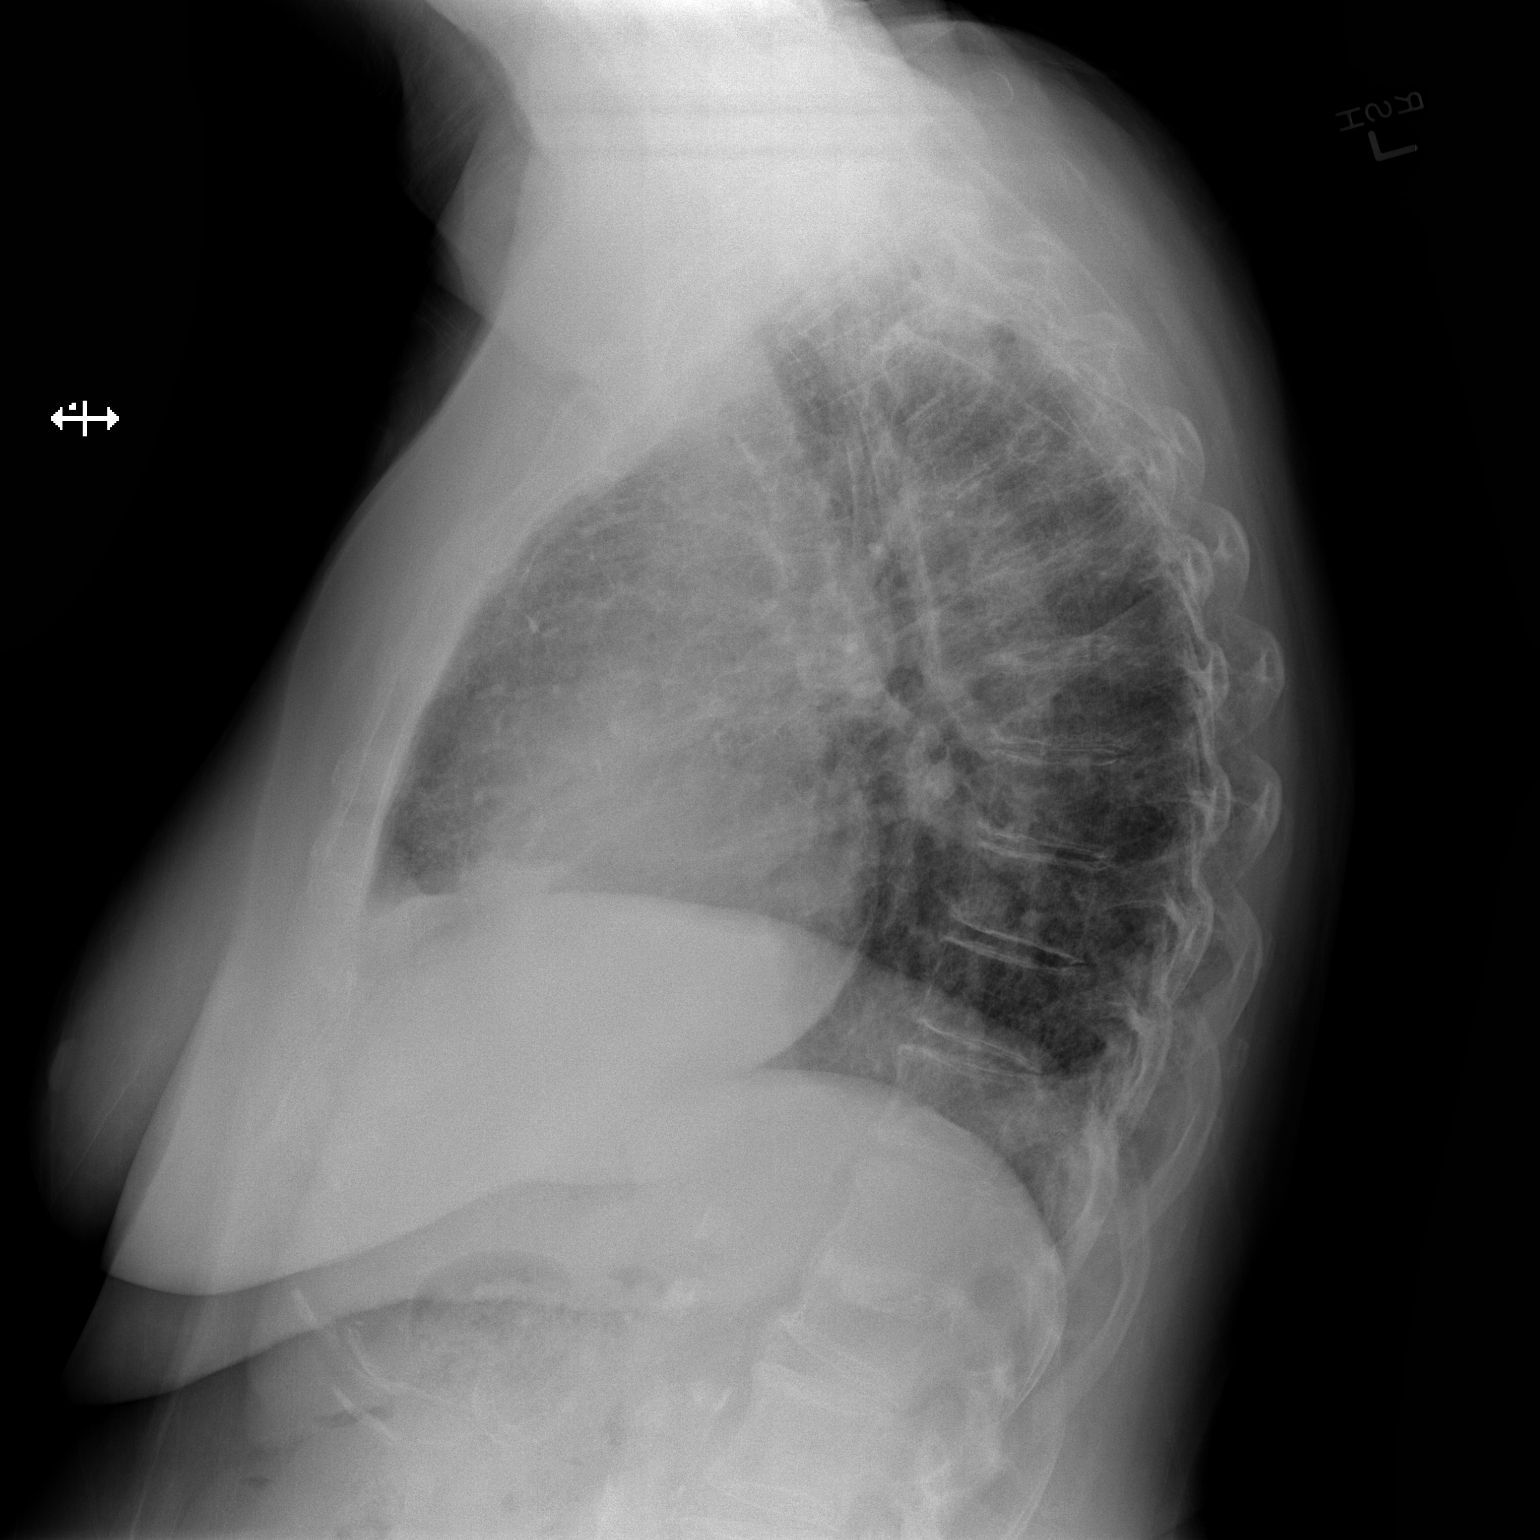

[2 of 2 positions shown; findings below may reference images not displayed]

FINDINGS: The cardiac silhouette is upper limits of normal in size. Aortic
atherosclerosis is noted. Mild elevation of the right hemidiaphragm
is unchanged. Increased interstitial markings throughout both lungs
are unchanged. No acute airspace consolidation, overt pulmonary
edema, pleural effusion, pneumothorax is identified. No acute
osseous abnormality is seen.
IMPRESSION: Chronic interstitial lung disease without evidence of acute
cardiopulmonary process.

## 2021-12-30 ENCOUNTER — Ambulatory Visit (HOSPITAL_BASED_OUTPATIENT_CLINIC_OR_DEPARTMENT_OTHER): Payer: Medicare Other | Admitting: Anesthesiology

## 2021-12-30 ENCOUNTER — Encounter (HOSPITAL_COMMUNITY)
Admission: RE | Disposition: A | Discharge status: Home / Self Care | Payer: Self-pay | Source: Home / Self Care | Attending: Surgery

## 2021-12-30 ENCOUNTER — Ambulatory Visit (HOSPITAL_COMMUNITY): Payer: Medicare Other | Admitting: Vascular Surgery

## 2021-12-30 ENCOUNTER — Ambulatory Visit (HOSPITAL_COMMUNITY)
Admission: RE | Admit: 2021-12-30 | Discharge: 2021-12-30 | Disposition: A | Discharge status: Home / Self Care | Payer: Medicare Other | Attending: Surgery | Admitting: Surgery

## 2021-12-30 ENCOUNTER — Other Ambulatory Visit: Payer: Self-pay

## 2021-12-30 DIAGNOSIS — I2510 Atherosclerotic heart disease of native coronary artery without angina pectoris: Secondary | ICD-10-CM

## 2021-12-30 DIAGNOSIS — Z87891 Personal history of nicotine dependence: Secondary | ICD-10-CM

## 2021-12-30 DIAGNOSIS — Z9981 Dependence on supplemental oxygen: Secondary | ICD-10-CM | POA: Insufficient documentation

## 2021-12-30 DIAGNOSIS — F32A Depression, unspecified: Secondary | ICD-10-CM | POA: Diagnosis not present

## 2021-12-30 DIAGNOSIS — D759 Disease of blood and blood-forming organs, unspecified: Secondary | ICD-10-CM | POA: Insufficient documentation

## 2021-12-30 DIAGNOSIS — E039 Hypothyroidism, unspecified: Secondary | ICD-10-CM | POA: Insufficient documentation

## 2021-12-30 DIAGNOSIS — J449 Chronic obstructive pulmonary disease, unspecified: Secondary | ICD-10-CM | POA: Insufficient documentation

## 2021-12-30 DIAGNOSIS — Z79899 Other long term (current) drug therapy: Secondary | ICD-10-CM | POA: Diagnosis not present

## 2021-12-30 DIAGNOSIS — K219 Gastro-esophageal reflux disease without esophagitis: Secondary | ICD-10-CM | POA: Diagnosis not present

## 2021-12-30 DIAGNOSIS — I110 Hypertensive heart disease with heart failure: Secondary | ICD-10-CM | POA: Insufficient documentation

## 2021-12-30 DIAGNOSIS — D649 Anemia, unspecified: Secondary | ICD-10-CM | POA: Diagnosis not present

## 2021-12-30 DIAGNOSIS — I509 Heart failure, unspecified: Secondary | ICD-10-CM

## 2021-12-30 DIAGNOSIS — E119 Type 2 diabetes mellitus without complications: Secondary | ICD-10-CM

## 2021-12-30 DIAGNOSIS — D638 Anemia in other chronic diseases classified elsewhere: Secondary | ICD-10-CM

## 2021-12-30 LAB — GLUCOSE, CAPILLARY
Glucose-Capillary: 124 mg/dL — ABNORMAL HIGH (ref 70–99)
Glucose-Capillary: 160 mg/dL — ABNORMAL HIGH (ref 70–99)

## 2021-12-30 SURGERY — INSERTION, CAROTID SINUS BAROREFLEX ACTIVATION DEVICE
Anesthesia: General | Site: Neck | Laterality: Right

## 2021-12-30 MED ORDER — ONDANSETRON HCL 4 MG/2ML IJ SOLN
INTRAMUSCULAR | Status: DC | PRN
  Administered 2021-12-30: 4 mg via INTRAVENOUS

## 2021-12-30 MED ORDER — FENTANYL CITRATE (PF) 100 MCG/2ML IJ SOLN
INTRAMUSCULAR | Status: AC
  Filled 2021-12-30: qty 2

## 2021-12-30 MED ORDER — LACTATED RINGERS IV SOLN: INTRAVENOUS | Status: DC

## 2021-12-30 MED ORDER — ETOMIDATE 2 MG/ML IV SOLN
INTRAVENOUS | Status: DC | PRN
  Administered 2021-12-30: 8 mg via INTRAVENOUS

## 2021-12-30 MED ORDER — ORAL CARE MOUTH RINSE: 15.0000 mL | Freq: Once | OROMUCOSAL | Status: AC

## 2021-12-30 MED ORDER — INSULIN ASPART 100 UNIT/ML IJ SOLN: 0.0000 [IU] | INTRAMUSCULAR | Status: DC | PRN

## 2021-12-30 MED ORDER — MIDAZOLAM HCL 2 MG/2ML IJ SOLN
INTRAMUSCULAR | Status: DC | PRN
  Administered 2021-12-30 (×2): 1 mg via INTRAVENOUS

## 2021-12-30 MED ORDER — OXYCODONE-ACETAMINOPHEN 5-325 MG PO TABS: 1.0000 | ORAL_TABLET | Freq: Four times a day (QID) | ORAL | 0 refills | Status: DC | PRN

## 2021-12-30 MED ORDER — FENTANYL CITRATE (PF) 250 MCG/5ML IJ SOLN
INTRAMUSCULAR | Status: AC
  Filled 2021-12-30: qty 5

## 2021-12-30 MED ORDER — SODIUM CHLORIDE 0.9 % IV SOLN
INTRAVENOUS | Status: DC | PRN
  Administered 2021-12-30: 500 mL

## 2021-12-30 MED ORDER — SODIUM CHLORIDE 0.9 % IV SOLN: INTRAVENOUS | Status: DC

## 2021-12-30 MED ORDER — CHLORHEXIDINE GLUCONATE 4 % EX LIQD: 60.0000 mL | Freq: Once | CUTANEOUS | Status: DC

## 2021-12-30 MED ORDER — DEXAMETHASONE SODIUM PHOSPHATE 10 MG/ML IJ SOLN
INTRAMUSCULAR | Status: AC
  Filled 2021-12-30: qty 1

## 2021-12-30 MED ORDER — CEFAZOLIN SODIUM-DEXTROSE 2-4 GM/100ML-% IV SOLN
2.0000 g | INTRAVENOUS | Status: AC
  Administered 2021-12-30: 2 g via INTRAVENOUS
  Filled 2021-12-30: qty 100

## 2021-12-30 MED ORDER — VASOPRESSIN 20 UNIT/ML IV SOLN
INTRAVENOUS | Status: AC
  Filled 2021-12-30: qty 1

## 2021-12-30 MED ORDER — FENTANYL CITRATE (PF) 100 MCG/2ML IJ SOLN
25.0000 ug | INTRAMUSCULAR | Status: DC | PRN
  Administered 2021-12-30: 25 ug via INTRAVENOUS

## 2021-12-30 MED ORDER — LIDOCAINE HCL (PF) 1 % IJ SOLN
INTRAMUSCULAR | Status: AC
  Filled 2021-12-30: qty 30

## 2021-12-30 MED ORDER — PROPOFOL 10 MG/ML IV BOLUS
INTRAVENOUS | Status: DC | PRN
  Administered 2021-12-30: 50 mg via INTRAVENOUS

## 2021-12-30 MED ORDER — LIDOCAINE 2% (20 MG/ML) 5 ML SYRINGE
INTRAMUSCULAR | Status: DC | PRN
  Administered 2021-12-30: 60 mg via INTRAVENOUS

## 2021-12-30 MED ORDER — PHENYLEPHRINE HCL-NACL 20-0.9 MG/250ML-% IV SOLN
INTRAVENOUS | Status: DC | PRN
  Administered 2021-12-30: 30 ug/min via INTRAVENOUS

## 2021-12-30 MED ORDER — PHENYLEPHRINE 80 MCG/ML (10ML) SYRINGE FOR IV PUSH (FOR BLOOD PRESSURE SUPPORT)
PREFILLED_SYRINGE | INTRAVENOUS | Status: DC | PRN
  Administered 2021-12-30: 40 ug via INTRAVENOUS
  Administered 2021-12-30: 80 ug via INTRAVENOUS
  Administered 2021-12-30: 40 ug via INTRAVENOUS

## 2021-12-30 MED ORDER — 0.9 % SODIUM CHLORIDE (POUR BTL) OPTIME
TOPICAL | Status: DC | PRN
  Administered 2021-12-30: 1000 mL

## 2021-12-30 MED ORDER — ETOMIDATE 2 MG/ML IV SOLN
INTRAVENOUS | Status: AC
  Filled 2021-12-30: qty 10

## 2021-12-30 MED ORDER — PROPOFOL 10 MG/ML IV BOLUS
INTRAVENOUS | Status: AC
  Filled 2021-12-30: qty 20

## 2021-12-30 MED ORDER — ONDANSETRON HCL 4 MG/2ML IJ SOLN
INTRAMUSCULAR | Status: AC
  Filled 2021-12-30: qty 2

## 2021-12-30 MED ORDER — ACETAMINOPHEN 10 MG/ML IV SOLN
INTRAVENOUS | Status: AC
  Filled 2021-12-30: qty 100

## 2021-12-30 MED ORDER — CHLORHEXIDINE GLUCONATE 0.12 % MT SOLN
15.0000 mL | Freq: Once | OROMUCOSAL | Status: AC
  Administered 2021-12-30: 15 mL via OROMUCOSAL
  Filled 2021-12-30: qty 15

## 2021-12-30 MED ORDER — DEXAMETHASONE SODIUM PHOSPHATE 10 MG/ML IJ SOLN
INTRAMUSCULAR | Status: DC | PRN
  Administered 2021-12-30: 5 mg via INTRAVENOUS

## 2021-12-30 MED ORDER — LACTATED RINGERS IV SOLN: INTRAVENOUS | Status: DC | PRN

## 2021-12-30 MED ORDER — SUGAMMADEX SODIUM 200 MG/2ML IV SOLN
INTRAVENOUS | Status: DC | PRN
  Administered 2021-12-30: 200 mg via INTRAVENOUS

## 2021-12-30 MED ORDER — BUPIVACAINE HCL (PF) 0.5 % IJ SOLN
INTRAMUSCULAR | Status: AC
  Filled 2021-12-30: qty 30

## 2021-12-30 MED ORDER — ROCURONIUM BROMIDE 10 MG/ML (PF) SYRINGE
PREFILLED_SYRINGE | INTRAVENOUS | Status: AC
  Filled 2021-12-30: qty 10

## 2021-12-30 MED ORDER — ACETAMINOPHEN 10 MG/ML IV SOLN
1000.0000 mg | Freq: Once | INTRAVENOUS | Status: DC | PRN
  Administered 2021-12-30: 1000 mg via INTRAVENOUS

## 2021-12-30 MED ORDER — MIDAZOLAM HCL 2 MG/2ML IJ SOLN
INTRAMUSCULAR | Status: AC
  Filled 2021-12-30: qty 2

## 2021-12-30 MED ORDER — LIDOCAINE 2% (20 MG/ML) 5 ML SYRINGE
INTRAMUSCULAR | Status: AC
  Filled 2021-12-30: qty 5

## 2021-12-30 MED ORDER — FENTANYL CITRATE (PF) 250 MCG/5ML IJ SOLN
INTRAMUSCULAR | Status: DC | PRN
  Administered 2021-12-30 (×2): 50 ug via INTRAVENOUS
  Administered 2021-12-30: 100 ug via INTRAVENOUS
  Administered 2021-12-30 (×2): 25 ug via INTRAVENOUS

## 2021-12-30 MED ORDER — ROCURONIUM BROMIDE 10 MG/ML (PF) SYRINGE
PREFILLED_SYRINGE | INTRAVENOUS | Status: DC | PRN
  Administered 2021-12-30: 60 mg via INTRAVENOUS

## 2021-12-30 SURGICAL SUPPLY — 40 items
ADH SKN CLS APL DERMABOND .7 (GAUZE/BANDAGES/DRESSINGS) ×1
APL PRP STRL LF DISP 70% ISPRP (MISCELLANEOUS) ×1
BAG COUNTER SPONGE SURGICOUNT (BAG) ×2 IMPLANT
BAG SPNG CNTER NS LX DISP (BAG) ×1
CANISTER SUCT 3000ML PPV (MISCELLANEOUS) ×2 IMPLANT
CHLORAPREP W/TINT 26 (MISCELLANEOUS) ×2 IMPLANT
CLIP VESOCCLUDE MED 6/CT (CLIP) IMPLANT
CLIP VESOCCLUDE SM WIDE 6/CT (CLIP) IMPLANT
DERMABOND ADVANCED .7 DNX12 (GAUZE/BANDAGES/DRESSINGS) ×2 IMPLANT
ELECT REM PT RETURN 9FT ADLT (ELECTROSURGICAL) ×1
ELECTRODE REM PT RTRN 9FT ADLT (ELECTROSURGICAL) ×2 IMPLANT
GENERATOR IPG BAROSTIM 2104 (Generator) IMPLANT
GLOVE SURG SS PI 7.5 STRL IVOR (GLOVE) ×6 IMPLANT
GOWN STRL REUS W/ TWL LRG LVL3 (GOWN DISPOSABLE) ×6 IMPLANT
GOWN STRL REUS W/ TWL XL LVL3 (GOWN DISPOSABLE) ×2 IMPLANT
GOWN STRL REUS W/TWL LRG LVL3 (GOWN DISPOSABLE) ×3
GOWN STRL REUS W/TWL XL LVL3 (GOWN DISPOSABLE) ×1
HEMOSTAT SNOW SURGICEL 2X4 (HEMOSTASIS) IMPLANT
KIT BASIN OR (CUSTOM PROCEDURE TRAY) ×2 IMPLANT
KIT TURNOVER KIT B (KITS) ×2 IMPLANT
LEAD CAROTID BAROSTIM 1036 (Lead) IMPLANT
MARKER SKIN DUAL TIP RULER LAB (MISCELLANEOUS) ×2 IMPLANT
NDL 18GX1X1/2 (RX/OR ONLY) (NEEDLE) ×2 IMPLANT
NEEDLE 18GX1X1/2 (RX/OR ONLY) (NEEDLE) ×1 IMPLANT
NS IRRIG 1000ML POUR BTL (IV SOLUTION) ×4 IMPLANT
PACK CAROTID (CUSTOM PROCEDURE TRAY) ×2 IMPLANT
PAD ARMBOARD 7.5X6 YLW CONV (MISCELLANEOUS) ×4 IMPLANT
PAD ONESTEP ZOLL R SERIES ADT (MISCELLANEOUS) IMPLANT
POSITIONER HEAD DONUT 9IN (MISCELLANEOUS) ×2 IMPLANT
SUT ETHIBOND CT1 BRD #0 30IN (SUTURE) ×4 IMPLANT
SUT ETHILON 3 0 PS 1 (SUTURE) IMPLANT
SUT PROLENE 6 0 BV (SUTURE) ×16 IMPLANT
SUT SILK 0 FSL (SUTURE) IMPLANT
SUT VIC AB 3-0 SH 27 (SUTURE) ×2
SUT VIC AB 3-0 SH 27X BRD (SUTURE) ×4 IMPLANT
SUT VICRYL 4-0 PS2 18IN ABS (SUTURE) ×4 IMPLANT
SYR 5ML LL (SYRINGE) ×2 IMPLANT
SYR BULB IRRIG 60ML STRL (SYRINGE) ×2 IMPLANT
TOWEL GREEN STERILE (TOWEL DISPOSABLE) ×2 IMPLANT
WATER STERILE IRR 1000ML POUR (IV SOLUTION) ×2 IMPLANT

## 2021-12-30 NOTE — Transfer of Care (Signed)
Immediate Anesthesia Transfer of Care Note  Patient: Susan Hunt  Procedure(s) Performed: INSERTION OF [name] (Right: Neck)  Patient Location: PACU  Anesthesia Type:General  Level of Consciousness: awake and alert   Airway & Oxygen Therapy: Patient Spontanous Breathing and Patient connected to face mask oxygen  Post-op Assessment: Report given to RN and Post -op Vital signs reviewed and stable  Post vital signs: Reviewed and stable  Last Vitals:  Vitals Value Taken Time  BP 153/93 12/30/21 0915  Temp    Pulse 89 12/30/21 0916  Resp 27 12/30/21 0916  SpO2 95 % 12/30/21 0916  Vitals shown include unvalidated device data.  Last Pain:  Vitals:   12/30/21 0625  TempSrc:   PainSc: 0-No pain         Complications: No notable events documented.

## 2021-12-30 NOTE — Anesthesia Procedure Notes (Signed)
Procedure Name: Intubation Date/Time: 12/30/2021 7:45 AM  Performed by: Erick Colace, CRNAPre-anesthesia Checklist: Patient identified, Emergency Drugs available, Suction available and Patient being monitored Patient Re-evaluated:Patient Re-evaluated prior to induction Oxygen Delivery Method: Circle system utilized Preoxygenation: Pre-oxygenation with 100% oxygen Induction Type: IV induction and Cricoid Pressure applied Ventilation: Mask ventilation without difficulty Laryngoscope Size: Mac and 4 Grade View: Grade II Tube type: Oral Tube size: 7.0 mm Number of attempts: 1 Airway Equipment and Method: Stylet and Oral airway Placement Confirmation: ETT inserted through vocal cords under direct vision, positive ETCO2 and breath sounds checked- equal and bilateral Secured at: 21 cm Tube secured with: Tape Dental Injury: Teeth and Oropharynx as per pre-operative assessment

## 2021-12-30 NOTE — Op Note (Signed)
Patient name: Susan Hunt MRN: [id number] DOB: 1945-09-14 Sex: female  12/30/2021 Pre-operative Diagnosis: NYHA class III heart failure Post-operative diagnosis:  Same Surgeon:  Annamarie Major Assistants:  Medora Bing, PA Procedure:   Placement of right sided Barostim Anesthesia:  General Blood Loss:  minimal Specimens:  none  Findings:  Device at position "A" Device: [id number], model 04/14/2002 Lead: [id number], model: 1036   Indications: This is a 76 year old with NYHA class III heart failure who is here today for device implant  Procedure:  The patient was identified in the holding area and taken to [place] 11  The patient was then placed supine on the table. general anesthesia was administered.  The patient was prepped and draped in the usual sterile fashion.  A time out was called and antibiotics were administered.  A PA was necessary to expedite the procedure and assist with technical details.  She helped with suction and retraction and to provide exposure.  She also help with skin closure and price testing.  Ultrasound was used to identify the right carotid bifurcation and the right neck.  This was marked with a pen.  I then made a transverse 3 cm incision over top of the carotid bifurcation.  Cautery was used to divide the subcutaneous tissue down to the platysma muscle which was divided with cautery.  I then sharply dissected out the common carotid artery as well as the carotid bifurcation.  There was calcified plaque at the distal common carotid artery.  Once I had adequate exposure, attention was turned towards the pocket.  1 fingerbreadth below the clavicle, an incision was made.  Cautery was used divide subcutaneous tissue down to the pectoral fascia.  Next using cautery and blunt dissection a pocket was created.  I then used a tonsil to create a tunnel between the 2 incisions.  The battery was then placed in the pocket and connected to the lead which was brought through the  tunnel.  We then began testing.  Impedance levels were appropriate.  I began testing at position "a".  We had an immediate dramatic drop in blood pressure and heart rate.  I then placed 2 Prolene 6-0 sutures to tack the electrode in place and then we repeated testing and had similar excellent results.  I then placed 4 additional 6-0 Prolene sutures to tack down the lead.  Two 6-0 Prolene sutures were used for the strain relief loop which was attached to the adventitia of the common carotid artery.  Next, both incisions were irrigated with antibiotic solution.  Hemostasis was achieved.  The generator was then placed into the pocket and 2-0 Ethibond sutures were used to attach it to the pectoral fascia.  I then again made sure that we had good hemostasis.  I closed the carotid incision by reapproximating the platysma muscle with 3-0 Vicryl and the skin with 4-0 Vicryl.  The pocket incision was closed by reapproximating subcutaneous tissue with 3-0 Vicryl and skin with 4-0 Vicryl.  The patient was successfully extubated after Dermabond was applied and taken to recovery in stable condition.  There were no immediate complications.   Disposition: To PACU stable.   Theotis Burrow, M.D., [facility] [place] Office: [phone] Pager:  [phone]

## 2021-12-30 NOTE — Progress Notes (Signed)
Pt O2 dropped to 86 on room air. Pt placed on 2L [place] O2 at 97%. Pt wears 2L [place] at home as needed. Howze MD notified pt baseline O2 stays at 88-94% at home with no oxygen. Howze MD "Okay" to d/c pt if pt maintains baseline O2.

## 2021-12-30 NOTE — H&P (Signed)
[facility]   Patient name: Susan Hunt    MRN: [id number]        DOB: 06/25/1945            Sex: female     REQUESTING PROVIDER:     Dr. Rayann Heman     REASON FOR CONSULT:    Barostim evaluation   HISTORY OF PRESENT ILLNESS:    Susan Hunt is a 76 y.o. female, who is referred for Barostim evaluation.  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  She suffers from NYHA congestive heart failure, class III.  Her most recent echo on 05/26/2021 shows an ejection fraction of 30 to 35%.  Her NT proBNP was 216 on 06/30/2021.  She remains symptomatic despite optimal medical management including ACE inhibitor, aldosterone inhibitor, SGLT2 inhibitor.  Her symptoms include mild to moderate exertional fatigue, edema, weakness, and dizziness.   PAST MEDICAL HISTORY          Past Medical History:  Diagnosis Date   Anemia     Asthma      "I dont think I have it.   CHF (congestive heart failure) (HCC)     Depression     Emphysema of lung (HCC)     GERD (gastroesophageal reflux disease)     Hemorrhoids     History of kidney stones      Cystoscopy   Hyperlipidemia     Hypertension     Hypothyroidism     Obesity     Pre-diabetes     Rosacea     Tubular adenoma of colon 03/26/2019    Colonoscopy repeat in 3 years        FAMILY HISTORY         Family History  Problem Relation Age of Onset   Heart disease Mother     Hypertension Mother     Osteoporosis Mother     Stroke Mother     Alcohol abuse Father     Diabetes Maternal Grandmother     Drug abuse Son     Breast cancer Neg Hx        SOCIAL HISTORY:    Social History         Socioeconomic History   Marital status: Married      Spouse name: Not on file   Number of children: Not on file   Years of education: Not on file   Highest education level: Not on file  Occupational History   Not on file  Tobacco Use   Smoking status: Former       Packs/day: 0.50      Years: 15.00      Pack years: 7.50      Types: Cigarettes      Quit date: 08/18/2013      Years since quitting: 7.9   Smokeless tobacco: Never  Vaping Use   Vaping Use: Never used  Substance and Sexual Activity   Alcohol use: Not Currently      Alcohol/week: 1.0 standard drink      Types: 1 Glasses of wine per week      Comment: 2-3 glasses of wine weekly   Drug use: No   Sexual activity: Yes  Other Topics Concern   Not on file  Social History Narrative   Not on file    Social Determinants of Health    Financial Resource Strain: Not on file  Food Insecurity: Not on  file  Transportation Needs: Not on file  Physical Activity: Not on file  Stress: Not on file  Social Connections: Not on file  Intimate Partner Violence: Not on file      ALLERGIES:           Allergies  Allergen Reactions   Ace Inhibitors Cough   Clarithromycin        REACTION: GI sx   Codeine Nausea Only   Sulfamethoxazole Nausea Only   Telmisartan Other (See Comments)      sleepy      CURRENT MEDICATIONS:            Current Outpatient Medications  Medication Sig Dispense Refill   amLODipine (NORVASC) 5 MG tablet TAKE ONE TABLET BY MOUTH DAILY 90 tablet 3   azithromycin (ZITHROMAX) 250 MG tablet Pauline Good 6 each 0   carvedilol (COREG) 25 MG tablet Take 1 tablet (25 mg total) by mouth 2 (two) times daily. 180 tablet 3   cetirizine (ZYRTEC) 10 MG tablet Take 10 mg by mouth daily.       FARXIGA 10 MG TABS tablet Take 1 tablet (10 mg total) by mouth daily. 30 tablet 4   fluticasone (FLONASE) 50 MCG/ACT nasal spray Place 2 sprays into both nostrils at bedtime as needed (allergies.).        levothyroxine (SYNTHROID) 125 MCG tablet TAKE ONE TABLET BY MOUTH DAILY 90 tablet 0   montelukast (SINGULAIR) 10 MG tablet TAKE ONE TABLET BY MOUTH EVERY NIGHT AT BEDTIME 90 tablet 1   Multiple Vitamin (MULTIVITAMIN WITH MINERALS) TABS tablet Take 1 tablet by mouth daily.        omeprazole (PRILOSEC) 20 MG capsule Take 1 capsule (20 mg total) by mouth daily. 90 capsule 3   ondansetron (ZOFRAN-ODT) 4 MG disintegrating tablet DISSOLVE ONE TABLET BY MOUTH EVERY 8 HOURS AS NEEDED FOR NAUSEA/ VOMITING 20 tablet 0   Pirfenidone (ESBRIET) 801 MG TABS Take 801 mg by mouth with breakfast, with lunch, and with evening meal. 90 tablet 11   rosuvastatin (CRESTOR) 10 MG tablet TAKE ONE TABLET BY MOUTH DAILY 90 tablet 3   spironolactone (ALDACTONE) 25 MG tablet Take 1 tablet (25 mg total) by mouth daily. 60 tablet 1   valsartan (DIOVAN) 320 MG tablet Take 1 tablet (320 mg total) by mouth daily. 90 tablet 3   venlafaxine XR (EFFEXOR-XR) 150 MG 24 hr capsule Take 1 capsule (150 mg total) by mouth daily. 90 capsule 3    No current facility-administered medications for this visit.      REVIEW OF SYSTEMS:    _0  denotes positive finding, _1  denotes negative finding Cardiac   Comments:  Chest pain or chest pressure:      Shortness of breath upon exertion:      Short of breath when lying flat:      Irregular heart rhythm:             Vascular      Pain in calf, thigh, or hip brought on by ambulation:      Pain in feet at night that wakes you up from your sleep:       Blood clot in your veins:      Leg swelling:              Pulmonary      Oxygen at home:      Productive cough:       Wheezing:  Neurologic      Sudden weakness in arms or legs:       Sudden numbness in arms or legs:       Sudden onset of difficulty speaking or slurred speech:      Temporary loss of vision in one eye:       Problems with dizziness:              Gastrointestinal      Blood in stool:         Vomited blood:              Genitourinary      Burning when urinating:       Blood in urine:             Psychiatric      Major depression:              Hematologic      Bleeding problems:      Problems with blood clotting too easily:             Skin      Rashes or ulcers:              Constitutional      Fever or chills:        PHYSICAL EXAM:       Vitals:    08/02/21 0847  BP: 125/77  Pulse: 65  Resp: 20  Temp: 98 F (36.7 C)  SpO2: 91%  Weight: 198 lb (89.8 kg)  Height: 5' 7" (1.702 m)      GENERAL: The patient is a well-nourished female, in no acute distress. The vital signs are documented above. CARDIAC: There is a regular rate and rhythm.  VASCULAR: Carotid bifurcation was evaluated with SonoSite and appears to be easily accessible PULMONARY: Nonlabored respirations ABDOMEN: Soft and non-tender with normal pitched bowel sounds.  MUSCULOSKELETAL: There are no major deformities or cyanosis. NEUROLOGIC: No focal weakness or paresthesias are detected. SKIN: There are no ulcers or rashes noted. PSYCHIATRIC: The patient has a normal affect.   STUDIES:    I have reviewed the following carotid duplex: Right Carotid: Velocities in the right ICA are consistent with a 1-39%  stenosis.   Left Carotid: Velocities in the left ICA are consistent with a 1-39%  stenosis.   Vertebrals:  Bilateral vertebral arteries demonstrate antegrade flow.  Subclavians: Normal flow hemodynamics were seen in bilateral subclavian               arteries.    ASSESSMENT and PLAN    NYHA class III heart failure: The patient has been evaluated for Barostim implant.  I feel she is a good candidate.  I discussed the details of the operation as well as the risks and benefits.  All questions were answered.  This will be scheduled once we get insurance approval.     Annamarie Major, IV, MD, FACS [facility] [phone] Pager [phone]   Now she has insurance approval.  Copntinues to have SOB and fatigue despite GDMT CV:RRR PULM:CTA ABD soft Plan right sided Barostim.  All questions answered  Nita Sickle

## 2021-12-30 NOTE — Anesthesia Procedure Notes (Signed)
Arterial Line Insertion Start/End10/19/2023 7:20 AM, 12/30/2021 7:24 AM Performed by: Darral Dash, DO, anesthesiologist  Patient location: Pre-op. Preanesthetic checklist: patient identified, IV checked, site marked, risks and benefits discussed, surgical consent, monitors and equipment checked, pre-op evaluation, timeout performed and anesthesia consent Lidocaine 1% used for infiltration Left, radial was placed Catheter size: 20 G Hand hygiene performed  and maximum sterile barriers used   Attempts: 1 Procedure performed using ultrasound guided technique. Ultrasound Notes:image(s) printed for medical record Following insertion, dressing applied and Biopatch. Post procedure assessment: normal and unchanged  Post procedure complications: unsuccessful attempts, second provider assisted and local hematoma. Patient tolerated the procedure well with no immediate complications.

## 2022-01-02 ENCOUNTER — Other Ambulatory Visit: Payer: Self-pay | Admitting: Internal Medicine

## 2022-01-03 NOTE — Anesthesia Postprocedure Evaluation (Signed)
Anesthesia Post Note  Patient: Susan Hunt  Procedure(s) Performed: INSERTION OF [name] (Right: Neck)     Patient location during evaluation: PACU Anesthesia Type: General Level of consciousness: awake and alert Pain management: pain level controlled Vital Signs Assessment: post-procedure vital signs reviewed and stable Respiratory status: spontaneous breathing, nonlabored ventilation, respiratory function stable and patient connected to nasal cannula oxygen Cardiovascular status: blood pressure returned to baseline and stable Postop Assessment: no apparent nausea or vomiting Anesthetic complications: no   No notable events documented.  Last Vitals:  Vitals:   12/30/21 1035 12/30/21 1038  BP:    Pulse:    Resp:    Temp:    SpO2: 92% 92%    Last Pain:  Vitals:   12/30/21 1030  TempSrc:   PainSc: 3                  Layali Freund P Marissa Weaver

## 2022-01-05 ENCOUNTER — Encounter: Payer: Self-pay | Admitting: Internal Medicine

## 2022-01-07 ENCOUNTER — Other Ambulatory Visit: Payer: Self-pay | Admitting: Family Medicine

## 2022-01-07 MED ORDER — VALSARTAN 320 MG PO TABS: 320.0000 mg | ORAL_TABLET | Freq: Every day | ORAL | 1 refills | Status: DC

## 2022-01-13 NOTE — Progress Notes (Signed)
Electrophysiology Office Note Date: 01/18/2022  ID:  Susan Hunt, Susan Hunt 01-Sep-1945, MRN [id number]  PCP: Leamon Arnt, MD Primary Cardiologist: Elouise Munroe, MD Electrophysiologist: Virl Axe, MD   CC: Barostim titration   Susan Hunt is a 76 y.o. female seen today for barostim titration.   This is her initial visit. She is doing well since implantation.  She has not noticed any change in her HF symptoms. She has mild/mod fatigue. She has SOB with her ADLs. She usually has to take a 5-10 min break after getting ready in the mornings. She becomes SOB quickly if she has to move in a hurry.   The patients goals for barostim titration are to improve her functional status. Walking, swimming, running, and Tennis. Has been 2 years or more since she's tried. Now limited by SOB.   Device History: Barostim (standard) implanted 12/30/2021 for Chronic systolic CHF   Past Medical History:  Diagnosis Date   Anemia    Asthma    "I dont think I have it.   CHF (congestive heart failure) (HCC)    Depression    Diabetes mellitus without complication ([place])    type 2   Emphysema of lung (HCC)    GERD (gastroesophageal reflux disease)    Hemorrhoids    History of kidney stones    Cystoscopy   Hyperlipidemia    Hypertension    Hypothyroidism    Obesity    Pulmonary fibrosis (HCC)    Rosacea    Sleep apnea    mild, no need for CPAP   Tubular adenoma of colon 03/26/2019   Colonoscopy repeat in 3 years   Past Surgical History:  Procedure Laterality Date   ABDOMINAL HYSTERECTOMY     ANTERIOR AND POSTERIOR VAGINAL REPAIR  2020   CARDIAC CATHETERIZATION  11/01/2019   CATARACT EXTRACTION W/ INTRAOCULAR LENS IMPLANT Bilateral 2016   COLONOSCOPY     CYSTOSCOPY     DENTAL SURGERY  2016   Dental Implant   FRACTURE SURGERY Left 2013   Left ankle   INTERCOSTAL NERVE BLOCK Right 12/13/2019   Procedure: INTERCOSTAL NERVE BLOCK;  Surgeon: Melrose Nakayama, MD;   Location: [facility];  Service: Thoracic;  Laterality: Right;   LUNG BIOPSY Right 12/13/2019   Procedure: LUNG BIOPSY;  Surgeon: Melrose Nakayama, MD;  Location: [place];  Service: Thoracic;  Laterality: Right;   LYMPH NODE BIOPSY Right 12/13/2019   Procedure: LYMPH NODE BIOPSY;  Surgeon: Melrose Nakayama, MD;  Location: [facility];  Service: Thoracic;  Laterality: Right;   medtronix nerve stimulator  07/18/2019   Complete InterStim Sacral Neuromudulation system implantation 07/18/19, [facility]   NASAL SINUS SURGERY     RIGHT/LEFT HEART CATH AND CORONARY ANGIOGRAPHY N/A 11/01/2019   Procedure: RIGHT/LEFT HEART CATH AND CORONARY ANGIOGRAPHY;  Surgeon: Martinique, Peter M, MD;  Location: [facility];  Service: Cardiovascular;  Laterality: N/A;   VAGINECTOMY, PARTIAL     VIDEO BRONCHOSCOPY N/A 12/13/2019   Procedure: VIDEO BRONCHOSCOPY;  Surgeon: Melrose Nakayama, MD;  Location: [facility];  Service: Thoracic;  Laterality: N/A;    Current Outpatient Medications  Medication Sig Dispense Refill   amLODipine (NORVASC) 5 MG tablet TAKE ONE TABLET BY MOUTH DAILY 90 tablet 3   benzonatate (TESSALON) 200 MG capsule Take 1 capsule (200 mg total) by mouth 3 (three) times daily as needed for cough. 30 capsule 1   carvedilol (COREG) 25 MG tablet Take 1 tablet (  25 mg total) by mouth 2 (two) times daily. 180 tablet 3   cetirizine (ZYRTEC) 10 MG tablet Take 10 mg by mouth daily.     FARXIGA 10 MG TABS tablet Take 1 tablet (10 mg total) by mouth daily. 30 tablet 4   levothyroxine (SYNTHROID) 125 MCG tablet TAKE 1 TABLET BY MOUTH DAILY 90 tablet 0   montelukast (SINGULAIR) 10 MG tablet TAKE ONE TABLET BY MOUTH EVERY NIGHT AT BEDTIME 90 tablet 1   Multiple Vitamin (MULTIVITAMIN WITH MINERALS) TABS tablet Take 1 tablet by mouth daily.     omeprazole (PRILOSEC) 20 MG capsule TAKE ONE CAPSULE BY MOUTH DAILY ON AN EMPTY STOMACH 90 capsule 3   ondansetron (ZOFRAN-ODT) 4 MG disintegrating tablet DISSOLVE ONE TABLET BY  MOUTH EVERY 8 HOURS AS NEEDED FOR NAUSEA/ VOMITING 20 tablet 0   oxyCODONE-acetaminophen (PERCOCET) 5-325 MG tablet Take 1 tablet by mouth every 6 (six) hours as needed for severe pain. 10 tablet 0   Pirfenidone (ESBRIET) 801 MG TABS Take 801 mg by mouth with breakfast, with lunch, and with evening meal. 90 tablet 11   rosuvastatin (CRESTOR) 10 MG tablet TAKE ONE TABLET BY MOUTH DAILY 90 tablet 3   spironolactone (ALDACTONE) 25 MG tablet Take 1 tablet (25 mg total) by mouth daily. 90 tablet 0   valsartan (DIOVAN) 320 MG tablet Take 1 tablet (320 mg total) by mouth daily. 90 tablet 1   venlafaxine XR (EFFEXOR-XR) 150 MG 24 hr capsule TAKE ONE CAPSULE BY MOUTH DAILY 90 capsule 3   No current facility-administered medications for this visit.    Allergies:   Ace inhibitors, Clarithromycin, Codeine, Sulfamethoxazole, and Telmisartan   Social History: Social History   Socioeconomic History   Marital status: Married    Spouse name: Not on file   Number of children: 1   Years of education: Not on file   Highest education level: Not on file  Occupational History   Not on file  Tobacco Use   Smoking status: Former    Packs/day: 0.50    Years: 15.00    Total pack years: 7.50    Types: Cigarettes    Quit date: 08/18/2013    Years since quitting: 8.4   Smokeless tobacco: Never  Vaping Use   Vaping Use: Never used  Substance and Sexual Activity   Alcohol use: Not Currently    Comment: 2-3 glasses of wine weekly   Drug use: No   Sexual activity: Yes  Other Topics Concern   Not on file  Social History Narrative   Not on file   Social Determinants of Health   Financial Resource Strain: Not on file  Food Insecurity: No Food Insecurity (12/22/2021)   Hunger Vital Sign    Worried About Running Out of Food in the Last Year: Never true    Ran Out of Food in the Last Year: Never true  Transportation Needs: No Transportation Needs (12/22/2021)   PRAPARE - Radiographer, therapeutic (Medical): No    Lack of Transportation (Non-Medical): No  Physical Activity: Not on file  Stress: Not on file  Social Connections: Not on file  Intimate Partner Violence: Not on file    Family History: Family History  Problem Relation Age of Onset   Heart disease Mother    Hypertension Mother    Osteoporosis Mother    Stroke Mother    Alcohol abuse Father    Diabetes Maternal Grandmother    Drug abuse  Son    Breast cancer Neg Hx      Review of Systems: All other systems reviewed and are otherwise negative except as noted above.  Physical Exam: Vitals:   01/18/22 1226  BP: 116/70  Pulse: 72  SpO2: 97%  Weight: 196 lb 9.6 oz (89.2 kg)  Height: _0  (1.702 m)     GEN- The patient is well appearing, alert and oriented x 3 today.   HEENT: normocephalic, atraumatic; sclera clear, conjunctiva pink; hearing intact; oropharynx clear; neck supple  Lungs- Clear to ausculation bilaterally, normal work of breathing.  No wheezes, rales, rhonchi Heart- Regular rate and rhythm, no murmurs, rubs or gallops  GI- soft, non-tender, non-distended, bowel sounds present  Extremities- no clubbing, cyanosis, or edema  MS- no significant deformity or atrophy Skin- warm and dry, no rash or lesion; PPM pocket well healed Psych- euthymic mood, full affect Neuro- strength and sensation are intact  Barostim Interrogation- Performed personally and reviewed in detail today,  See scanned report  EKG:  EKG is not ordered today.  Recent Labs: 07/22/2021: Pro B Natriuretic peptide (BNP) 51.0 11/02/2021: B Natriuretic Peptide 16.8 12/22/2021: ALT 16; BUN 13; Creatinine, Ser 0.93; Hemoglobin 15.5; Platelets 236; Potassium 4.2; Sodium 140   Wt Readings from Last 3 Encounters:  01/18/22 196 lb 9.6 oz (89.2 kg)  12/30/21 190 lb (86.2 kg)  12/22/21 197 lb (89.4 kg)     Other studies Reviewed: Additional studies/ records that were reviewed today include:   Assessment and  Plan:  1. Chronic systolic CHF s/p Barostim implantation 12/2021 NYHA III symptoms, confounded   Device titrated from 1.0 millamp to 5.0 milliamp by increments of 0.4 with good blood pressure response and no stimulation up to 6.0 Device impedence stable. Pt goals are to improve her functional   Normal device function See scanned report. Will follow up in 4 weeks to continue titration with goal of 6-8 milliamps for chronic settings.    Current medicines are reviewed at length with the patient today.   The patient does not have concerns regarding her medicines.  The following changes were made today:  none  Labs/ tests ordered today include:  No orders of the defined types were placed in this encounter.   Disposition:   Follow up with EP APP in 2-4 Weeks for further barostim titration.   Jacalyn Lefevre, PA-C  01/18/2022 1:02 PM  [address] [place] [address] [phone] (office) [phone] (fax)

## 2022-01-16 ENCOUNTER — Other Ambulatory Visit: Payer: Self-pay | Admitting: Family Medicine

## 2022-01-16 DIAGNOSIS — R300 Dysuria: Secondary | ICD-10-CM | POA: Diagnosis not present

## 2022-01-16 DIAGNOSIS — N3001 Acute cystitis with hematuria: Secondary | ICD-10-CM | POA: Diagnosis not present

## 2022-01-18 ENCOUNTER — Encounter: Payer: Self-pay | Admitting: Student

## 2022-01-18 ENCOUNTER — Ambulatory Visit: Payer: Medicare Other | Attending: Student | Admitting: Student

## 2022-01-18 VITALS — BP 116/70 | HR 72 | Ht 67.0 in | Wt 196.6 lb

## 2022-01-18 DIAGNOSIS — I5022 Chronic systolic (congestive) heart failure: Secondary | ICD-10-CM | POA: Diagnosis not present

## 2022-01-18 DIAGNOSIS — I428 Other cardiomyopathies: Secondary | ICD-10-CM | POA: Diagnosis not present

## 2022-01-18 NOTE — Patient Instructions (Signed)
Medication Instructions:  Your physician recommends that you continue on your current medications as directed. Please refer to the Current Medication list given to you today.  *If you need a refill on your cardiac medications before your next appointment, please call your pharmacy*   Lab Work: None  If you have labs (blood work) drawn today and your tests are completely normal, you will receive your results only by: [place] (if you have MyChart) OR A paper copy in the mail If you have any lab test that is abnormal or we need to change your treatment, we will call you to review the results.   Follow-Up: At [facility], you and your health needs are our priority.  As part of our continuing mission to provide you with exceptional heart care, we have created designated Provider Care Teams.  These Care Teams include your primary Cardiologist (physician) and Advanced Practice Providers (APPs -  Physician Assistants and Nurse Practitioners) who all work together to provide you with the care you need, when you need it.   Your next appointment:   As scheduled  Important Information About [place]

## 2022-01-24 ENCOUNTER — Encounter (HOSPITAL_COMMUNITY): Payer: Medicare Other | Admitting: Internal Medicine

## 2022-01-28 ENCOUNTER — Telehealth: Payer: Self-pay

## 2022-01-28 NOTE — Telephone Encounter (Signed)
Received fax from [place] stating that they have been unable to contact pt regarding renewal for 2024. Completed form and faxed back along with copy of insurance card. Will await response.

## 2022-01-30 ENCOUNTER — Other Ambulatory Visit: Payer: Self-pay | Admitting: Internal Medicine

## 2022-02-07 ENCOUNTER — Encounter: Payer: Self-pay | Admitting: Internal Medicine

## 2022-02-14 NOTE — Progress Notes (Unsigned)
Electrophysiology Office Note Date: 02/17/2022  ID:  Junice, Fei 09-Apr-1945, MRN [id number]  PCP: Leamon Arnt, MD Primary Cardiologist: Elouise Munroe, MD Electrophysiologist: Virl Axe, MD   CC: Barostim titration   Susan Hunt is a 76 y.o. female seen today for barostim titration.   At last visit, device titrated from 1.0 millamp to 5.0 milliamp by increments of 0.4 with good blood pressure response and no stimulation up to 6.0.  Today, she feels very good.  She has had an intermittent sore throat since the initial surgery, but no persistent fib.   Prior to surgery, she could "hardly get from the bed to the kitchen" without SOB. Now she's walking on TM for 10 minutes, and the bike for 10, and then usually at least one more exercise.   The patients goals for barostim titration are to improve her functional status. Walking, swimming, running, and Tennis. Has been 2 years or more since she's tried. Now limited by SOB.   Device History: Barostim (standard) implanted 12/30/2021 for Chronic systolic CHF   Past Medical History:  Diagnosis Date   Anemia    Asthma    "I dont think I have it.   CHF (congestive heart failure) (HCC)    Depression    Diabetes mellitus without complication ([place])    type 2   Emphysema of lung (HCC)    GERD (gastroesophageal reflux disease)    Hemorrhoids    History of kidney stones    Cystoscopy   Hyperlipidemia    Hypertension    Hypothyroidism    Obesity    Pulmonary fibrosis (HCC)    Rosacea    Sleep apnea    mild, no need for CPAP   Tubular adenoma of colon 03/26/2019   Colonoscopy repeat in 3 years   Past Surgical History:  Procedure Laterality Date   ABDOMINAL HYSTERECTOMY     ANTERIOR AND POSTERIOR VAGINAL REPAIR  2020   CARDIAC CATHETERIZATION  11/01/2019   CATARACT EXTRACTION W/ INTRAOCULAR LENS IMPLANT Bilateral 2016   COLONOSCOPY     CYSTOSCOPY     DENTAL SURGERY  2016   Dental Implant    FRACTURE SURGERY Left 2013   Left ankle   INTERCOSTAL NERVE BLOCK Right 12/13/2019   Procedure: INTERCOSTAL NERVE BLOCK;  Surgeon: Melrose Nakayama, MD;  Location: [facility];  Service: Thoracic;  Laterality: Right;   LUNG BIOPSY Right 12/13/2019   Procedure: LUNG BIOPSY;  Surgeon: Melrose Nakayama, MD;  Location: [facility];  Service: Thoracic;  Laterality: Right;   LYMPH NODE BIOPSY Right 12/13/2019   Procedure: LYMPH NODE BIOPSY;  Surgeon: Melrose Nakayama, MD;  Location: [facility];  Service: Thoracic;  Laterality: Right;   [employer] nerve stimulator  07/18/2019   Complete InterStim Sacral Neuromudulation system implantation 07/18/19, [facility]   NASAL SINUS SURGERY     RIGHT/LEFT HEART CATH AND CORONARY ANGIOGRAPHY N/A 11/01/2019   Procedure: RIGHT/LEFT HEART CATH AND CORONARY ANGIOGRAPHY;  Surgeon: Martinique, Peter M, MD;  Location: [facility];  Service: Cardiovascular;  Laterality: N/A;   VAGINECTOMY, PARTIAL     VIDEO BRONCHOSCOPY N/A 12/13/2019   Procedure: VIDEO BRONCHOSCOPY;  Surgeon: Melrose Nakayama, MD;  Location: [facility];  Service: Thoracic;  Laterality: N/A;    Current Outpatient Medications  Medication Sig Dispense Refill   amLODipine (NORVASC) 5 MG tablet TAKE ONE TABLET BY MOUTH DAILY 90 tablet 3   benzonatate (TESSALON) 200 MG capsule Take 1  capsule (200 mg total) by mouth 3 (three) times daily as needed for cough. 30 capsule 1   carvedilol (COREG) 25 MG tablet Take 1 tablet (25 mg total) by mouth 2 (two) times daily. 180 tablet 3   cetirizine (ZYRTEC) 10 MG tablet Take 10 mg by mouth daily.     FARXIGA 10 MG TABS tablet Take 1 tablet (10 mg total) by mouth daily. 30 tablet 4   levothyroxine (SYNTHROID) 125 MCG tablet TAKE 1 TABLET BY MOUTH DAILY 90 tablet 0   montelukast (SINGULAIR) 10 MG tablet TAKE ONE TABLET BY MOUTH EVERY NIGHT AT BEDTIME 90 tablet 1   Multiple Vitamin (MULTIVITAMIN WITH MINERALS) TABS tablet Take 1 tablet by mouth daily.     omeprazole  (PRILOSEC) 20 MG capsule TAKE ONE CAPSULE BY MOUTH DAILY ON AN EMPTY STOMACH 90 capsule 3   ondansetron (ZOFRAN-ODT) 4 MG disintegrating tablet DISSOLVE ONE TABLET BY MOUTH EVERY 8 HOURS AS NEEDED FOR NAUSEA/ VOMITING 20 tablet 0   oxyCODONE-acetaminophen (PERCOCET) 5-325 MG tablet Take 1 tablet by mouth every 6 (six) hours as needed for severe pain. 10 tablet 0   Pirfenidone (ESBRIET) 801 MG TABS Take 801 mg by mouth with breakfast, with lunch, and with evening meal. 90 tablet 11   rosuvastatin (CRESTOR) 10 MG tablet TAKE ONE TABLET BY MOUTH DAILY 90 tablet 3   spironolactone (ALDACTONE) 25 MG tablet TAKE 1 TABLET BY MOUTH DAILY 90 tablet 3   valsartan (DIOVAN) 320 MG tablet Take 1 tablet (320 mg total) by mouth daily. 90 tablet 1   venlafaxine XR (EFFEXOR-XR) 150 MG 24 hr capsule TAKE ONE CAPSULE BY MOUTH DAILY 90 capsule 3   No current facility-administered medications for this visit.    Allergies:   Ace inhibitors, Clarithromycin, Codeine, Sulfamethoxazole, and Telmisartan   Social History: Social History   Socioeconomic History   Marital status: Married    Spouse name: Not on file   Number of children: 1   Years of education: Not on file   Highest education level: Not on file  Occupational History   Not on file  Tobacco Use   Smoking status: Former    Packs/day: 0.50    Years: 15.00    Total pack years: 7.50    Types: Cigarettes    Quit date: 08/18/2013    Years since quitting: 8.5   Smokeless tobacco: Never  Vaping Use   Vaping Use: Never used  Substance and Sexual Activity   Alcohol use: Not Currently    Comment: 2-3 glasses of wine weekly   Drug use: No   Sexual activity: Yes  Other Topics Concern   Not on file  Social History Narrative   Not on file   Social Determinants of Health   Financial Resource Strain: Not on file  Food Insecurity: No Food Insecurity (12/22/2021)   Hunger Vital Sign    Worried About Running Out of Food in the Last Year: Never true     Ran Out of Food in the Last Year: Never true  Transportation Needs: No Transportation Needs (12/22/2021)   PRAPARE - Hydrologist (Medical): No    Lack of Transportation (Non-Medical): No  Physical Activity: Not on file  Stress: Not on file  Social Connections: Not on file  Intimate Partner Violence: Not on file    Family History: Family History  Problem Relation Age of Onset   Heart disease Mother    Hypertension Mother  Osteoporosis Mother    Stroke Mother    Alcohol abuse Father    Diabetes Maternal Grandmother    Drug abuse Son    Breast cancer Neg Hx    Review of Systems: Review of systems complete and found to be negative unless listed in HPI.    Physical Exam: Vitals:   02/17/22 1129  BP: 120/66  Pulse: 70  SpO2: 96%  Weight: 197 lb 6.4 oz (89.5 kg)  Height: _0  (1.702 m)     General: Pleasant, NAD. No resp difficulty Psych: Normal affect. HEENT:  Normal, without mass or lesion.         Neck: Supple, no bruits or JVD. Carotids 2+. No lymphadenopathy/thyromegaly appreciated. Heart: PMI nondisplaced. RRR no s3, s4, or murmurs. Lungs:  Resp regular and unlabored, CTA. Abdomen: Soft, non-tender, non-distended, No HSM, BS + x 4.   Extremities: No clubbing, cyanosis or edema. DP/PT/Radials 2+ and equal bilaterally. Neuro: Alert and oriented X 3. Moves all extremities spontaneously.   Barostim Interrogation- Performed personally and reviewed in detail today,  See scanned report  EKG:  EKG is not ordered today.  Recent Labs: 07/22/2021: Pro B Natriuretic peptide (BNP) 51.0 11/02/2021: B Natriuretic Peptide 16.8 12/22/2021: ALT 16; BUN 13; Creatinine, Ser 0.93; Hemoglobin 15.5; Platelets 236; Potassium 4.2; Sodium 140   Wt Readings from Last 3 Encounters:  02/17/22 197 lb 6.4 oz (89.5 kg)  01/18/22 196 lb 9.6 oz (89.2 kg)  12/30/21 190 lb (86.2 kg)    Other studies Reviewed: Additional studies/ records that were reviewed today  include:   Assessment and Plan:  1. Chronic systolic CHF s/p Barostim implantation 12/2021 NYHA III symptoms, confounded by her ILD Device titrated from 5.0 millamp to 7.0 milliamp by increments of 0.4 with good blood pressure response. Pt had increased coughing and started to fill the impedance checks at amplitudes > 7.0 ma.  We got up to 8.43m without any persistent stim, and decided to leave her at 7.0 ma with increased cough.  Device impedence stable. Pt goals are to improve her functional   Normal device function See scanned report. Will follow up in 4 weeks to continue titration with goal of 6-8 milliamps for chronic settings.   Current medicines are reviewed at length with the patient today.   The patient does not have concerns regarding her medicines.  The following changes were made today:  none  Labs/ tests ordered today include:  No orders of the defined types were placed in this encounter.  Disposition:   Follow up with EP APP in 3 months for further barostim titration.  SJacalyn Lefevre PA-C  02/17/2022 11:40 AM  [facility] [address] [phone](office) [phone](fax)

## 2022-02-15 ENCOUNTER — Telehealth: Payer: Self-pay | Admitting: Internal Medicine

## 2022-02-15 ENCOUNTER — Encounter: Payer: Self-pay | Admitting: Internal Medicine

## 2022-02-15 ENCOUNTER — Other Ambulatory Visit: Payer: Self-pay | Admitting: *Deleted

## 2022-02-15 DIAGNOSIS — J849 Interstitial pulmonary disease, unspecified: Secondary | ICD-10-CM

## 2022-02-15 MED ORDER — PIRFENIDONE 801 MG PO TABS: 801.0000 mg | ORAL_TABLET | Freq: Three times a day (TID) | ORAL | 11 refills | Status: DC

## 2022-02-15 NOTE — Telephone Encounter (Signed)
Per MyChart message today, Susan Hunt, CMA sent refill of pirfenidone to [facility] today  Knox Saliva, PharmD, MPH, BCPS, CPP Clinical Pharmacist (Rheumatology and Pulmonology)

## 2022-02-15 NOTE — Telephone Encounter (Signed)
Pharmacy team  The husband of Susan Hunt texted me this morning asking me to issue a new prescription for her pirfenidone/Esbriet.  He states that they have gone through the usual channels for refilling in the company states they were issued to request to our office for refill but our office has not responded.

## 2022-02-17 ENCOUNTER — Ambulatory Visit: Payer: Medicare Other | Attending: Student | Admitting: Student

## 2022-02-17 ENCOUNTER — Encounter: Payer: Self-pay | Admitting: Student

## 2022-02-17 VITALS — BP 120/66 | HR 70 | Ht 67.0 in | Wt 197.4 lb

## 2022-02-17 DIAGNOSIS — I5022 Chronic systolic (congestive) heart failure: Secondary | ICD-10-CM | POA: Diagnosis not present

## 2022-02-17 NOTE — Patient Instructions (Addendum)
  Medication Instructions:  Your physician recommends that you continue on your current medications as directed. Please refer to the Current Medication list given to you today.  *If you need a refill on your cardiac medications before your next appointment, please call your pharmacy*  Lab Work: You will have blood work drawn today:  BMET, and Pro-BNP   Testing/Procedures: None ordered.  Follow-Up: At [facility], you and your health needs are our priority.  As part of our continuing mission to provide you with exceptional heart care, we have created designated Provider Care Teams.  These Care Teams include your primary Cardiologist (physician) and Advanced Practice Providers (APPs -  Physician Assistants and Nurse Practitioners) who all work together to provide you with the care you need, when you need it.  We recommend signing up for the patient portal called "MyChart".  Sign up information is provided on this After Visit Summary.  MyChart is used to connect with patients for Virtual Visits (Telemedicine).  Patients are able to view lab/test results, encounter notes, upcoming appointments, etc.  Non-urgent messages can be sent to your provider as well.   To learn more about what you can do with MyChart, go to [email or web address].    Your next appointment:   Please schedule a follow up appointment with Mr. [name], PA-C in 3 Months  The format for your next appointment:   In Person  Provider:   Legrand Como "Jonni Sanger" [place], [place]    Important Information About Sugar

## 2022-02-17 NOTE — Telephone Encounter (Signed)
Pt appears to be approved for 2024. Patient consent form will expire on 12/26/2022, historically Celso Amy will reach out to our office when it is time to renew. Nothing further needed at this time.

## 2022-02-18 LAB — BASIC METABOLIC PANEL
BUN/Creatinine Ratio: 13 (ref 12–28)
BUN: 14 mg/dL (ref 8–27)
CO2: 22 mmol/L (ref 20–29)
Calcium: 10.2 mg/dL (ref 8.7–10.3)
Chloride: 101 mmol/L (ref 96–106)
Creatinine, Ser: 1.04 mg/dL — ABNORMAL HIGH (ref 0.57–1.00)
Glucose: 115 mg/dL — ABNORMAL HIGH (ref 70–99)
Potassium: 4.9 mmol/L (ref 3.5–5.2)
Sodium: 140 mmol/L (ref 134–144)
eGFR: 55 mL/min/{1.73_m2} — ABNORMAL LOW (ref >59)

## 2022-02-18 LAB — PRO B NATRIURETIC PEPTIDE: NT-Pro BNP: 103 pg/mL (ref 0–738)

## 2022-02-24 ENCOUNTER — Ambulatory Visit: Payer: Medicare Other | Admitting: Internal Medicine

## 2022-02-24 ENCOUNTER — Encounter: Payer: Self-pay | Admitting: *Deleted

## 2022-03-17 ENCOUNTER — Encounter (HOSPITAL_COMMUNITY): Payer: Medicare Other | Admitting: Internal Medicine

## 2022-03-21 ENCOUNTER — Encounter: Payer: Self-pay | Admitting: Internal Medicine

## 2022-03-24 ENCOUNTER — Encounter (HOSPITAL_COMMUNITY): Payer: Medicare Other | Admitting: Internal Medicine

## 2022-03-24 MED ORDER — FARXIGA 10 MG PO TABS: 10.0000 mg | ORAL_TABLET | Freq: Every day | ORAL | 3 refills | Status: DC

## 2022-03-31 ENCOUNTER — Ambulatory Visit: Payer: Medicare Other | Attending: Cardiology | Admitting: Student

## 2022-03-31 DIAGNOSIS — I5020 Unspecified systolic (congestive) heart failure: Secondary | ICD-10-CM

## 2022-03-31 NOTE — Progress Notes (Signed)
Industry assisted check in clinic. See attached file (pending).   Pt with complaints of "vibrating" from device to neck site on occasion. Not bothersome or painful.    Pt has also had cough and somewhat increased DOE over the past several weeks.  Has pulmonology follow up tomorrow.   She does NOT want to decrease therapy, so pulse width was decreased, with increase in ma to equal prior settings.   Pt will follow up as scheduled 05/19/2022, Sooner with further symptoms.   Legrand Como [address] [place], PA-C  03/31/2022 3:29 PM

## 2022-04-01 ENCOUNTER — Encounter: Payer: Self-pay | Admitting: Internal Medicine

## 2022-04-01 ENCOUNTER — Ambulatory Visit: Payer: Medicare Other | Admitting: Internal Medicine

## 2022-04-01 VITALS — BP 108/64 | HR 71 | Temp 97.7°F | Ht 67.0 in | Wt 199.6 lb

## 2022-04-01 DIAGNOSIS — R053 Chronic cough: Secondary | ICD-10-CM

## 2022-04-01 DIAGNOSIS — J84112 Idiopathic pulmonary fibrosis: Secondary | ICD-10-CM | POA: Diagnosis not present

## 2022-04-01 DIAGNOSIS — R0609 Other forms of dyspnea: Secondary | ICD-10-CM | POA: Diagnosis not present

## 2022-04-01 DIAGNOSIS — Z5181 Encounter for therapeutic drug level monitoring: Secondary | ICD-10-CM | POA: Diagnosis not present

## 2022-04-01 DIAGNOSIS — J849 Interstitial pulmonary disease, unspecified: Secondary | ICD-10-CM | POA: Diagnosis not present

## 2022-04-01 DIAGNOSIS — R42 Dizziness and giddiness: Secondary | ICD-10-CM

## 2022-04-01 LAB — HEPATIC FUNCTION PANEL
ALT: 14 U/L (ref 0–35)
AST: 20 U/L (ref 0–37)
Albumin: 4.2 g/dL (ref 3.5–5.2)
Alkaline Phosphatase: 75 U/L (ref 39–117)
Bilirubin, Direct: 0.1 mg/dL (ref 0.0–0.3)
Total Bilirubin: 0.5 mg/dL (ref 0.2–1.2)
Total Protein: 7.4 g/dL (ref 6.0–8.3)

## 2022-04-01 NOTE — Progress Notes (Signed)
OV 08/19/2019  Subjective:  Patient ID: Susan Hunt, female , DOB: 05-15-1945 , age 77 y.o. , MRN: [id number] , ADDRESS: [place] [id number]   08/19/2019 -   Chief Complaint  Patient presents with   Consult    COPD, emphysema.  sob with exertion.  dry coughs alot at night     HPI Susan Hunt 77 y.o. -she is a retired Marine scientist.  She used to work at the [employer] across [place] and having retired from that 10 years ago and then she worked for another agency apparently having retired from all duties few years ago.  Husband is a retired Software engineer.  They are here for shortness of breath.  She tells me that she has had insidious onset of shortness of breath and cough that started 1 year ago and has been progressive.  In the same time she is also been on nitrofurantoin for times.  First 3 of these were 1 week each.  The last 1 being for at least 6 weeks ending approximately 4-6 weeks ago.  Because of progressive symptoms she was given Stiolto but this did not help.  She has been on longstanding Singulair for several years independent of these problems.  This because of allergies.  Therefore she has been referred here.  She did have a chest x-ray that on my personal visualization shows ILD changes.  It is documented below.  She did have a CT abdomen lung images that was reported as normal but I am not so sure from a year ago.  She is a previous smoker.  She is also self-referred herself to [facility] on the basis that she think she might have advanced COPD and is looking for a Zephyr valve.  Symptoms started in August 2020.   [place] Integrated Comprehensive ILD Questionnaire  Symptoms:  She does have a cough.  She says the cough started in August 2020 the same for shortness of breath started.  She does cough at night.  She does cough when she lies down and it gets worse.  She does feel a tickle in her throat.  Cough does not affect her voice she  does not bring any phlegm.  There is no wheezing.  There is no nausea no vomiting no diarrhea.    Past Medical History :   She does have a histo abdomen ry of asthma for the last few years.  Also COPD not otherwise specified for the last few years.  His acid reflux disease for the last few years.  Diabetes for the last few weeks and thyroid disease for the last few years.  Otherwise denies any collagen vascular disease or vasculitis.  Denies any sleep apnea.  Denies pulmonary hypertension.  Denies stroke denies pneumonias recurrent pneumonias.  Denies heart disease or pleurisy.   ROS: Positive for fatigue for the last several months.  Arthralgia for the last several months.  Dry eyes for the last several months.  With intermittent nausea for the last few years.  T there is daytime sleepiness for the last several months.     FAMILY HISTORY of LUNG DISEASE:  -Denies any pulmonary fibrosis.  Denies COPD denies asthma in the family.  Denies sarcoid denies cystic fibrosis denies hypersensitive pneumonitis.  Denies any autoimmune disease.   EXPOSURE HISTORY:   -She denies any Covid history.  Denies any exposure to Covid.  She did do Covid vaccine.  No reactions after the  Covid vaccine.  Did smoke cigarettes 15-year 2000 and 2015 10 to 12 cigarettes a day.  Did not do any passive smoking.  Never did any marijuana.  Elevated electronic cigarettes.  Never use cocaine never used intravenous drug use.   HOME and HOBBY DETAILS :  -[place]  Is lived there for 21 years.  Age of the home is 34 years.  In 2004 there was mold/mildew in the shower curtain.  And then there was mold under the shower.  This was torn down.  This was in 2004 but currently none.  She did some mulch back at work 2 months ago.  No humidifier use no CPAP use.  No nebulizer use.  No steam iron use.  No Jacuzzi use.  No misting Fountain in the house.  No pet birds or parakeets no gerbils.  No feather pillows.  No  mold in the [facility] duct.  No music habits.   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  No otehrr organic antigen exposure that she recollects (MR side note: a different patient in 2016 reported that in 2013-2016 time lot of renovations there and mold +).  Patient reported on September 10, 2019: That between 1966 and 1980 she worked at [employer] and [place].  At that time there is lot of mold exposure in the building.  The building was cleaned out.  Also checked for inorganic antigen exposure and this is negative.  Never worked in a dusty environment.  Never exposed to fumes.   PULMONARY TOXICITY HISTORY (27 items): Use nitrofurantoin in 2019 and 2020.  This is described below.  Question if you can for this there is      Simple office walk 185 feet x  3 laps goal with forehead probe 08/19/2019   O2 used ra  Number laps completed Attempted 3 but desaturated at 2  Comments about pace Normal pace  Resting Pulse Ox/HR 100% and 101/min  Final Pulse Ox/HR 86% and 116/min  Desaturated </= 88% yes  Desaturated <= 3% points Yes , 14  Got Tachycardic >/= 90/min yes  Symptoms at end of test dyspneic  Miscellaneous comments Corrected with  2L [place]    Nuclear medicine cardiac stress test March 2016: Overall Impression:  Normal stress nuclear study with a small, mild, fixed apical defect consistent with thinning; no ischemia. LV Wall Motion:  NL LV Function; NL Wall Motion    CT abdomen lung cuts September 2020: Reported as normal.  I personally visualized and retrospect looks hazy and whether this is atelectasis or some other findings I do not know.  Jul 19, 2019:: Personally visualized shows findings of ILD.  I am not able to see a prior chest x-ray in the system or CT scan of the chest    Results for Susan, [name] (MRN [id number]) as of 08/19/2019 15:18  Ref. Range  03/26/2019 13:45  Creatinine Latest Ref Range: 0.40 - 1.20 mg/dL 1.05   ROS - per HPI Results for Susan, [name] (MRN [id number]) as of 08/19/2019 15:18  Ref. Range 03/26/2019 13:45  Hemoglobin Latest Ref Range: 12.0 - 15.0 g/dL 14.0     OV 09/10/2019  Subjective:  Patient ID: Susan Hunt, female , DOB: 11/05/1945 , age 85 y.o. , MRN: [id number] , ADDRESS: [place] [address]   09/10/2019 -  Chief Complaint  Patient presents with   Follow-up    shortness of breath with exertion and non-productive cough     HPI Susan Hunt 77 y.o. -presents for follow-up to discuss results of dyspnea work-up.  High-resolution CT chest was personally visualized.  It shows presence of interstitial lung disease.  It needs Fleischner criteria for diagnosis not consistent with UIP.  Per ATS this is alternative diagnosis to UIP.  Radiologist raised the concern of hypersensitive pneumonitis but there is no air-trapping.  The other possibilities NSIP.  She is no longer on nitrofurantoin.  In any event unit she only took it intermittently.  Overall she is stable since last visit.  She had autoimmune profile.  Is only trace positive for ANA.  Her IgE is slightly elevated.  Her PFT shows mild restriction with reduction in diffusion capacity.  We went over her exposure history again.  This time she did recollect being exposed to mold for 15 to 20 years up until 1980 while living in [place] and working at [employer].  She does not recollect mold at behavioral [facility] where she worked recently until she retired.  However another patient of mine did recollect mold in this building at [employer].   Low risk cardiac stress test in 2016 but with ejection fraction 54%.-Since then no overall change in health.  Husband inquired about the low ejection fraction.  In terms of symptoms: She is try to get portable oxygen system.  She is switching company to [employer] because of the  shortage of portable oxygen systems at [facility].     OV 11/12/2019   Subjective:  Patient ID: Susan Hunt, female , DOB: 28-Mar-1945, age 30 y.o. years. , MRN: [id number],  ADDRESS: [place] [address] PCP  Leamon Arnt, MD Providers : Treatment Team:  Attending Provider: Brand Males, MD Thoracic surgeon: Dr. Roxan Hockey Cardiologist Dr. Margaretann Loveless  Chief Complaint  Patient presents with   Follow-up    SOB    Follow-up interstitial lung disease not otherwise specified   HPI Susan Hunt 77 y.o. -returns for follow-up.  Her husband is with her.  After her last visit she saw cardiothoracic surgery but we had to hold the surgical lung biopsy because her echocardiogram from September 13, 2019 showed a drop in left ventricular systolic ejection fraction.  Since then she is seen cardiology.  She had a heart catheterization that shows ejection action 40% still low.  Nonobstructive coronary artery disease.  She is followed up with cardiology and she has been started on beta-blocker.  On her father medications was also adjusted.  So far no change in dyspnea.  She tells me overall dyspnea is the same without any change.  Symptom scores shows a two-point shift with worsening but she told the medical assistant that she is feeling worse.  Walking desaturation test today shows - normalcy .  She wants portable oxygen but the first DME company [facility] did not have portable oxygen tanks.  She is found another company [employer] that is able to give her portable oxygen.  She needs a requalifying walk that is documented below.  In terms of surgical lung biopsy: She did have some prominent mediastinal nodes.  It appears Dr. Roxan Hockey has informed them that he will do a EBUS for this.  They have also been told by cardiology that from a cardiac standpoint patient would be a suitable candidate for surgical lung biopsy.  The husband wanted  to know if a surgical biopsy is  indicated and also my perspective and safety.  xxxxxxxxxxxxxxxxxxxxxxxxxx Results for MICHALINA, CALBERT "PAM" (MRN [id number]) as of 09/10/2019 13:56  Ref. Range 08/23/2019 10:53  FVC-Pre Latest Units: L 2.61  FVC-%Pred-Pre Latest Units: % 80  FEV1-Pre Latest Units: L 2.26  FEV1-%Pred-Pre Latest Units: % 92  Results for RHEYA, MINOGUE "PAM" (MRN [id number]) as of 09/10/2019 13:56  Ref. Range 08/23/2019 10:53  DLCO cor Latest Units: ml/min/mmHg 13.76  DLCO cor % pred Latest Units: % 64   Results for JOSETTA, WIGAL "PAM" (MRN [id number]) as of 09/10/2019 13:56  Ref. Range 08/23/2019 10:53  TLC Latest Units: L 4.15  TLC % pred Latest Units: % 75   xxxxxxxxxxxxxxxxxxxxxx  CT chest high resolution June 2021 Lungs/Pleura: Mild pulmonary fibrosis in a pattern with apical predominance, featuring irregular peripheral interstitial opacity and scattered areas of subpleural bronchiolectasis. Mild, tubular bronchiectasis throughout. No significant air trapping on expiratory phase imaging. No pleural effusion or pneumothorax.   Upper Abdomen: No acute abnormality.   Musculoskeletal: No chest wall mass or suspicious bone lesions identified.   IMPRESSION: 1. Mild pulmonary fibrosis in a pattern with apical predominance, featuring irregular peripheral interstitial opacity and scattered areas of subpleural bronchiolectasis. Mild, tubular bronchiectasis throughout. Findings are most consistent with an "alternative diagnosis" pattern by ATS pulmonary fibrosis criteria, leading differential considerations chronic fibrotic hypersensitivity pneumonitis or NSIP. 2. Prominent mediastinal lymph nodes, nonspecific and likely reactive. 3. Coronary artery disease.  Aortic Atherosclerosis (ICD10-I70.0).     Electronically Signed   By: Eddie Candle M.D.   On: 09/04/2019 15:37    xxxxxxxxxxxxxxxxxxxxxxxxxxxxxxxxxxxxxxxxxxxxxxxxxx  Results for GORDIE, BELVIN "PAM" (MRN [id number]) as of  09/10/2019 13:56  Ref. Range 08/19/2019 16:30 08/19/2019 16:30  SEE BELOW Unknown  Comment  Anti Nuclear Antibody (ANA) Latest Ref Range: NEGATIVE  POSITIVE (A)   ANA Pattern 1 Unknown Cytoplasmic (A)   ANA Titer 1 Unknown 1:80 (H) Negative  Angiotensin-Converting Enzyme Latest Ref Range: 9 - 67 U/L 35   Anti JO-1 Latest Ref Range: 0.0 - 0.9 AI  <0.2  CENTROMERE AB SCREEN Latest Ref Range: 0.0 - 0.9 AI  <7.6  Cyclic Citrullin Peptide Ab Latest Units: UNITS <16   dsDNA Ab Latest Ref Range: 0 - 9 IU/mL  5  ENA RNP Ab Latest Ref Range: 0.0 - 0.9 AI  0.6  ENA SSA (RO) Ab Latest Ref Range: 0.0 - 0.9 AI  <0.2  ENA SSB (LA) Ab Latest Ref Range: 0.0 - 0.9 AI  <0.2  Myeloperoxidase Abs Latest Units: AI <1.0   Serine Protease 3 Latest Units: AI <1.0   RA Latex Turbid. Latest Ref Range: <14 IU/mL <14   ENA SM Ab Ser-aCnc Latest Ref Range: 0.0 - 0.9 AI  <0.2  Chromatin Ab SerPl-aCnc Latest Ref Range: 0.0 - 0.9 AI  <0.2     has a past medical history of Asthma, Depression, Emphysema of lung ([place]), Hemorrhoids, Hyperlipidemia, Hypertension, Hypothyroidism, Obesity, Rosacea, and Tubular adenoma of colon (03/26/2019).  IMPRESSIONS   ECHO 09/13/19  1. Left ventricular ejection fraction, by estimation, is 30 to 35%. The  left ventricle has moderately decreased function. The left ventricle  demonstrates global hypokinesis. Apex poorly visualized, consider repeat  limited echo with contrast to rule out  apical thrombus. The left ventricular internal cavity size was moderately  dilated. Left ventricular diastolic parameters are consistent with Grade I  diastolic dysfunction (impaired relaxation).   2. Right ventricular systolic  function is normal. The right ventricular  size is normal. Tricuspid regurgitation signal is inadequate for assessing  PA pressure.   3. The mitral valve is normal in structure. Trivial mitral valve  regurgitation.   4. The aortic valve was not well visualized. Aortic valve  regurgitation  is not visualized. No aortic stenosis is present.  CATH 8?20/21 Prox LAD to Mid LAD lesion is 30% stenosed. Ost Cx to Prox Cx lesion is 35% stenosed. Prox RCA lesion is 35% stenosed. There is moderate left ventricular systolic dysfunction. LV end diastolic pressure is normal. The left ventricular ejection fraction is 35-45% by visual estimate.   1. Nonobstructive CAD 2. Moderate LV dysfunction. EF estimated at 40% with global hypokinesis 3. Normal LV filling pressures 4. Normal right heart pressures 5. Normal cardiac output.    Plan: medical therapy for LV dysfunction and lipid lowering therapy for nonobstructive CAD.    OV 12/27/2019  Subjective:  Patient ID: Susan Hunt, female , DOB: 08/25/1945 , age 12 y.o. , MRN: [id number] , ADDRESS: [place] [address] PCP Leamon Arnt, MD Patient Care Team: Leamon Arnt, MD as PCP - General (Family Medicine) Elroy Channel, MD as Referring Physician (Internal Medicine) Rockey Situ as Consulting Physician (Obstetrics and Gynecology) Trinda Pascal, FNP as Consulting Physician (Urology) Brand Males, MD as Consulting Physician (Pulmonary Disease)  This Provider for this visit: Treatment Team:  Attending Provider: Brand Males, MD    12/27/2019 -   Chief Complaint  Patient presents with   Follow-up    discuss biopsy results. SOB sometimes. denies cough or wheezing.     HPI ZONYA GUDGER 77 y.o. --follow-up interstitial lung disease.  Had surgical biopsy December 13, 2019.  After that she is was in the hospital requiring oxygen but then got discharged without oxygen.  At this point time she is much improved but still having soreness from her chest incision site.  Husband and she are here.  They are concerned about some postoperative atelectasis that was seen.  I visualized the x-ray.  Today walking desaturation test is normal.  I reassured them about this.  But I  will give her incentive spirometer.  The main issue is to discuss the biopsy results: The bronchoalveolar lavage shows mixed cellularity with neutrophilia.  The surgical lung biopsy itself shows UIP with just 1 carcinoid tumor.  Reviewing her clinical history this UIP pattern is now consistent with IPF.  I gave her the diagnosis.  Regarding the carcinoid tumor let: I think this is an incidental finding.  There is no  DIPNECH syndrome  Her current symptom and walk profile are stable.   Results for ALEDA, MADL "PAM" (MRN [id number]) as of 12/27/2019 12:06  Ref. Range 12/13/2019 08:05  Color, Fluid Latest Ref Range: YELLOW  COLORLESS (A)  Total Nucleated Cell Count, Fluid Latest Ref Range: 0 - 1,000 cu mm 18  Lymphs, Fluid Latest Units: % 25  Eos, Fluid Latest Units: % 8  Appearance, Fluid Latest Ref Range: CLEAR  HAZY (A)  Other Cells, Fluid Latest Units: % MESOTHELIAL CELLS NOTED  Neutrophil Count, Fluid Latest Ref Range: 0 - 25 % 44 (H)  Monocyte-Macrophage-Serous Fluid Latest Ref Range: 50 - 90 % 23 (L)    OV 02/04/2020   Subjective:  Patient ID: Susan Hunt, female , DOB: 1945/12/11, age 33 y.o. years. , MRN: [id number],  ADDRESS: [place] [address] PCP  Leamon Arnt, MD  Providers : Treatment Team:  Attending Provider: Brand Males, MD Patient Care Team: Leamon Arnt, MD as PCP - General (Family Medicine) Elroy Channel, MD as Referring Physician (Internal Medicine) Rockey Situ as Consulting Physician (Obstetrics and Gynecology) Trinda Pascal, FNP as Consulting Physician (Urology) Brand Males, MD as Consulting Physician (Pulmonary Disease)    Chief Complaint  Patient presents with   Follow-up    ILD, doing well   Follow-up idiopathic pulmonary fibrosis diagnosed on surgical lung biopsy 12/13/2019.  Diagnosis formally given on 12/27/2019 Commenced pirfenidone late October 2021/early November  2021     HPI Susan Hunt 77 y.o. -returns for follow-up with her husband.  This visit is mainly to focus on pirfenidone uptake.  She has been taking pirfenidone for 3 weeks or more.  She is currently on full dose 3 pills 3 times a day.  The husband wants to know when we would roll to a full largest x1 pill 3 times daily.  I indicated to them that it would be 3 to 4 months of stability with pirfenidone before we made the decision.  Currently she is tolerating pirfenidone well.  She did have some mild nausea and she thinks it is because she did not take a full meal.  Overall she is handling pirfenidone well without any problems.  No fatigue no weight loss.  Respiratory wise she is stable.  She did get in touch with a local support group.  She is asking about pulmonary rehabilitation referral and she is interested in this.  One of the main issues they wanted to discuss was the fact the pirfenidone refill in the supply chain was not consistent.  There was anxiety that they were not going to get the refill.  She had to call the office.  She believes that it is because she made the call to the office that the refill was sent.  She says she tried to call [place] without any response for a long time.  I have indicated to her that I will ask our pharmacist to review and explained the logistics.  She will have a repeat LFT today      OV 04/02/2020  Subjective:  Patient ID: Susan Hunt, female , DOB: 1945/09/20 , age 75 y.o. , MRN: [id number] , ADDRESS: [place] [address] PCP Leamon Arnt, MD Patient Care Team: Leamon Arnt, MD as PCP - General (Family Medicine) Elroy Channel, MD as Referring Physician (Internal Medicine) Rockey Situ as Consulting Physician (Obstetrics and Gynecology) Trinda Pascal, FNP as Consulting Physician (Urology) Brand Males, MD as Consulting Physician (Pulmonary Disease)  This Provider for this visit: Treatment Team:   Attending Provider: Brand Males, MD    04/02/2020 -   Chief Complaint  Patient presents with   Follow-up    Pt states she has been doing well since last visit and denies any complaints.   Follow-up idiopathic pulmonary fibrosis diagnosed on surgical lung biopsy 12/13/2019.  Diagnosis formally given on 12/27/2019 Commenced pirfenidone late October 2021/early November 2021  HPI Susan Hunt 77 y.o. -returns for follow-up.  Presents with her husband.  She is tolerating pirfenidone fine with just very mild occasional nausea.  She wants to roll over to the 1 pill 3 times daily.  I have sent a message through secure chat to the pharmacist.  She is going to start pulmonary rehabilitation in February 2022.  We discussed lung transplantation but at this point we will  hold off.  She will check liver function test today.    There are no new issues.  She is already touch base with the support group her symptom score shows stability.  Of note she has returned to portable oxygen because she feels she does not need it.  Walking desaturation test confirms that.    She is interested in clinical trials.  She is touch base with cardiology and looking at a heart failure trial there.  She is also given her name for pulmonary fibrosis trials but currently there is a backlog.  I then emailed Berneda Rose cardiology Geophysicist/field seismologist and she replied saying, "She was in our [facility] trial which her enrollment is complete. I am not aware of any other trial we are screening her for currently but will ask the team. I think IPF trial option would be in her best interest. I also think IPF will exclude her from our HF device trials.  "   PF   OV 07/23/2020  Subjective:  Patient ID: Susan Hunt, female , DOB: 1945-12-07 , age 77 y.o. , MRN: [id number] , ADDRESS: [place] [id number] PCP Leamon Arnt, MD Patient Care Team: Leamon Arnt, MD as PCP - General (Family  Medicine) Elouise Munroe, MD as PCP - Cardiology (Cardiology) Elroy Channel, MD as Referring Physician (Internal Medicine) Rockey Situ as Consulting Physician (Obstetrics and Gynecology) Trinda Pascal, FNP as Consulting Physician (Urology) Brand Males, MD as Consulting Physician (Pulmonary Disease)  This Provider for this visit: Treatment Team:  Attending Provider: Brand Males, MD    07/23/2020 -   Chief Complaint  Patient presents with   Follow-up    IPF/ILD, denies changes   Follow-up idiopathic pulmonary fibrosis diagnosed on surgical lung biopsy 12/13/2019.  Diagnosis formally given on 12/27/2019 Commenced pirfenidone late October 2021/early November 2021. Screen failed for [facility] Part D trial due to soc coreg intake - Spring 2022. IPF final dx on MDD Jul 21, 2020  HPI Susan Hunt 77 y.o. -returns for follow-up.  At this point in time she is tolerating pirfenidone well.  In fact no side effects.  Her symptom score is stable.  She had high-resolution CT chest as part of a research protocol and there is no progressions in September 2021.  Her walking desaturation test shows a tendency to drop.  Is unclear if it is worse or the same because she walks faster than usual.  She feels stable.  However April 2022 she had recent PFTs FVC, 2.122, 71.6%, DLCO: 11.4, 54% -> this would suggest a decline.  Of note she try to qualify for the phase 2 study CALLED  Pliant..  However she is on carvedilol for hypertension control.  Hypertensive related to the reason of her cardiomyopathy.  Therefore she is screen failed.  She has good control of blood pressure with carvedilol.  She is interested in other research trials that she might qualify for.  Her husband Matison Nuccio is with her today.        OV 01/04/2021  Subjective:  Patient ID: Susan Hunt, female , DOB: 1945/10/24 , age 43 y.o. , MRN: [id number] , ADDRESS: [address] [place]  [id number] PCP Leamon Arnt, MD Patient Care Team: Leamon Arnt, MD as PCP - General (Family Medicine) Elouise Munroe, MD as PCP - Cardiology (Cardiology) Elroy Channel, MD as Referring Physician (Internal Medicine) Rockey Situ as Consulting Physician (Obstetrics and Gynecology) Hanley Seamen,  Burman Freestone, FNP as Consulting Physician (Urology) Brand Males, MD as Consulting Physician (Pulmonary Disease)  This Provider for this visit: Treatment Team:  Attending Provider: Brand Males, MD    01/04/2021 -   Chief Complaint  Patient presents with   Follow-up    Pt states she had a spell 4 weeks ago where her BP dropped too low to where she passed out and fell into a dresser which caused her to break some ribs. Pt said since this happened, she has had a little increased SOB as she is not able to take a deep breath.   Follow-up idiopathic pulmonary fibrosis diagnosed on surgical lung biopsy 12/13/2019.  Diagnosis formally given on 12/27/2019 Commenced pirfenidone late October 2021/early November 2021. Screen failed for Pliant Part D trial due to soc coreg intake - Spring 2022. IPF final dx on MDD Jul 21, 2020  HPI Susan Hunt 77 y.o. -returns for follow-up.  Last seen in late spring early summer 2022.  She presents with her husband Merry Proud.  She tells me that in August 2022 she broke her right tibia after having a slip and fall on the wet floor.  She then was seen by Dr. Mardelle Matte.  She was in a cast.  Then the cast came off then a few weeks later which was approximately 4 weeks ago in September 2022 she was getting out of bed and then fell down and sustained multiple rib fractures not otherwise specified.  She does not know which rib fractures.  She has not had a CT to confirm rib fracture but only had a chest x-ray.  Her husband believes this is because of orthostasis from her antihypertensives.  Most recently she is seeing cardiology Dr. Margaretann Loveless and has been referred to  the heart failure service.  She also tells me that Dr. Mardelle Matte told her that the chest x-ray suggested she might need thoracentesis and that one of the reason she is here today.  I am not able to visualize a chest x-ray the last chest x-ray was in July 2022 that I personally visualized and this showed pulmonary fibrosis..  At this point in time she is having significant symptoms because of the rib fracture particularly in terms of pain.  Her pain level is 4 out of 5.  This is preventing her from walking and mobilizing well.  She also believe she has gained 15 pounds of weight after starting the pirfenidone.  She says this weight gain happened before the rib fractures.  She is surprised because most often pirfenidone causes weight loss she believes the weight gain is because of pirfenidone.  She believes her thyroid function was normal.  Review of the chart indicates 8 pound weight gain happening mostly between January 2022 and May 2022.  Husband also acknowledges that she is more sedentary  Symptom score shows worsening.  Walking desaturation test is documented below -typically she will be able to complete 3 laps and desaturate 7 8 points although still adequate.  Today she stopped it 2 laps because of shortness of breath.  She did not even have a tachycardic response.  She only dropped 3 points pulse ox.  All of this is is more consistent with deconditioning and splinting from rib fracture rather than pulmonary fibrosis getting worse.     Results for KAYLEIGH, BROADWELL "PAM" (MRN [id number]) as of 01/04/2021 15:19  Ref. Range 12/11/2020 12:08  AST Latest Ref Range: 10 - 35 U/L 20  ALT Latest Ref Range: 6 -  29 U/L 16    OV 02/11/2021  Subjective:  Patient ID: Susan Hunt, female , DOB: Jul 27, 1945 , age 41 y.o. , MRN: [id number] , ADDRESS: [address]  [place] [address] PCP Leamon Arnt, MD Patient Care Team: Leamon Arnt, MD as PCP - General (Family Medicine) Elouise Munroe, MD as PCP - Cardiology (Cardiology) Elroy Channel, MD as Referring Physician (Internal Medicine) Rockey Situ as Consulting Physician (Obstetrics and Gynecology) Trinda Pascal, FNP as Consulting Physician (Urology) Brand Males, MD as Consulting Physician (Pulmonary Disease)  This Provider for this visit: Treatment Team:  Attending Provider: Brand Males, MD    02/11/2021 -   Chief Complaint  Patient presents with   Follow-up    Pt states she is doing better compared to last visit and states that her ribs are also better as well.   Follow-up idiopathic pulmonary fibrosis diagnosed on surgical lung biopsy 12/13/2019.  Diagnosis formally given on 12/27/2019 Commenced pirfenidone late October 2021/early November 2021. Screen failed for Pliant Part D trial due to soc coreg intake - Spring 2022. IPF final dx on MDD Jul 21, 2020  HPI Susan Hunt 77 y.o. -returns for follow-up.  This visit is to ensure that ILD is not getting worse.  At last visit in the aftermath of her fall and rib fracture she was in significant pain and she is having more shortness of breath.  We could not determine if the ILD was getting worse and was the reason for shortness of breath of the rib fracture was the reason for increased shortness of breath.  We also could not perform pulmonary function testing.  At this point in time she is reporting improved symptoms.  The pain is completely resolved.  She feels the rib fractures have healed.  She is tolerating pirfenidone well without any side effects.  She is also lost some weight.  She is interested in clinical trials.  She feels she can do a PFT again.  Symptom score below shows improvement.   CT Chest data  No results found.     OV 04/29/2021  Subjective:  Patient ID: Susan Hunt, female , DOB: [birth year] , age 89 y.o. , MRN: [id number] , ADDRESS: [place] [address] PCP Leamon Arnt, MD Patient Care  Team: Leamon Arnt, MD as PCP - General (Family Medicine) Elouise Munroe, MD as PCP - Cardiology (Cardiology) Elroy Channel, MD as Referring Physician (Internal Medicine) Rockey Situ as Consulting Physician (Obstetrics and Gynecology) Trinda Pascal, FNP as Consulting Physician (Urology) Brand Males, MD as Consulting Physician (Pulmonary Disease)  This Provider for this visit: Treatment Team:  Attending Provider: Brand Males, MD    04/29/2021 -   Chief Complaint  Patient presents with   Follow-up    PFT performed today. Pt states she has been coughing a lot since last visit.    Follow-up idiopathic pulmonary fibrosis diagnosed on surgical lung biopsy 12/13/2019.  Diagnosis formally given on 12/27/2019 Commenced pirfenidone late October 2021/early November 2021. Screen failed for Pliant Part D trial due to soc coreg intake - Spring 2022. IPF final dx on MDD Jul 21, 2020 HPI Susan Hunt 77 y.o. -returns for follow-up.  Her husband Merry Proud is not with her today.  He is standing outside and talking on the phone apparently.  She tells me that overall she is stable.  She says she has stable dyspnea on exertion although on the symptom score  her dyspnea score is now 14 and slightly worse.  She tells me that she gets quite short of breath carrying the 12 pound dog but she categorically insist that this is the same level of dyspnea even compared to 1 year ago.  Her main issue is that for the last 1-2 months she is got insidious onset of chronic cough that is new onset and getting worse.  Happened after the last visit it is dry it happens in the middle of the day and night but at night it is worse.  It is very mild is a level 2 out of 5 at 1 out of 5.  There is no wheezing or chest pain orthopnea.    She had pulmonary function test today.  The inspiratory loop does not look all that robust the FVC appears 18% decline compared to a year and a half ago while the DLCO  is worse around 10%..  She is very surprised further decline in PFTs because she is not feeling it.  She has upcoming research consent visit for [facility] study on 05/10/2021.  The study looks at the phase 3 investigational medical product of inhaled treprostinil versus placebo.  Inhaled treprostinil has been on the market for over 20 years.  Few years ago it was approved for WHO group 3 pulmonary hypertension secondary to ILD.  In this particular study the main question is if inhaled treprostinil will be a primary agent against IPF.  She is looking forward to participating in the study she already has a consent form.  After the consent per protocol she is supposed to have a high-resolution CT chest.   No results found.     OV 07/22/2021  Subjective:  Patient ID: Susan Hunt, female , DOB: 09/22/1945 , age 35 y.o. , MRN: [id number] , ADDRESS: [place] [address] PCP Leamon Arnt, MD Patient Care Team: Leamon Arnt, MD as PCP - General (Family Medicine) Elouise Munroe, MD as PCP - Cardiology (Cardiology) Elroy Channel, MD as Referring Physician (Internal Medicine) Rockey Situ as Consulting Physician (Obstetrics and Gynecology) Trinda Pascal, FNP as Consulting Physician (Urology) Brand Males, MD as Consulting Physician (Pulmonary Disease)  This Provider for this visit: Treatment Team:  Attending Provider: Brand Males, MD    07/22/2021 -   Chief Complaint  Patient presents with   Follow-up    PFT performed today.  Pt states she has been doing okay since last visit and denies any complaints.    Follow-up idiopathic pulmonary fibrosis diagnosed on surgical lung biopsy 12/13/2019.  Diagnosis formally given on 12/27/2019. Also discussed MDD Jul 21, 2020  Commenced pirfenidone late October 2021/early November 2021.  - Screen failed for Pliant Part D trial due to soc coreg intake - Spring 2022.  -Screen for for [facility] inhaled  treprostinil study after blinded pathologist did not agree with UIP  HPI Susan Hunt 77 y.o. -returns for follow-up.  Since last visit she is stable.  She does acknowledge compared to a year or 2 ago she is more short of breath.  Pulmonary function test compared to February 2023 and today is stable but compared to 2021 there is 18% decline in [facility] in 2 years.  She is doing a lot of yard work and this does give her out.  Therefore she also think she might be more short of breath.  Her symptom score suggest some possible increase.  She is not using any oxygen.  Recently  she tried to enroll in the inhaled treprostinil study for IPF but she was disqualified because a blinded pathologist did not agree with our pathologist.  Our case conference discussion is that she has IPF.  Of note because of nonischemic cardiomyopathy chronic systolic dysfunction she is being considered for  BAROSTIM.  I reviewed the cardiology notes.  Apparently they offer this as a research protocol but given the fact that IPF is considered more of a bad prognosis she wants to enroll in IPF study.  I agree with this.  We will looking at the study called DAEWOONG Phase 2 agent BESIPOROCIN versus placebo that directly ask on Prolene and collagen.  She is interested in this.  She will have a lot of investigations through the study.  Otherwise no new issues.  She does mention that one of her husbands golfing friends is my patient.   Her last blood work for liver function test was a few months ago.      OV 08/04/2021  Subjective:  Patient ID: Susan Hunt, female , DOB: November 27, 1945 , age 11 y.o. , MRN: [id number] , ADDRESS: [place] [address] PCP Leamon Arnt, MD Patient Care Team: Leamon Arnt, MD as PCP - General (Family Medicine) Elouise Munroe, MD as PCP - Cardiology (Cardiology) Elroy Channel, MD as Referring Physician (Internal Medicine) Rockey Situ as Consulting Physician  (Obstetrics and Gynecology) Trinda Pascal, FNP as Consulting Physician (Urology) Brand Males, MD as Consulting Physician (Pulmonary Disease)  This Provider for this visit: Treatment Team:  Attending Provider: Brand Males, MD    08/04/2021 -   Chief Complaint  Patient presents with   Acute Visit    Pt states she has had worsening SOB for the past week that she states has been worse all the time. Also has had a lot of coughing but is not able to get any phlegm up.    HPI Susan Hunt 77 y.o. - ACUTE visit. She seemed stable when I saw her  a few week  ago. BUt she called 3 days later on 07/25/21 with a hacking non-productive cough. Given Z pak and and 8d prednisone on 07/26/21 that ended 2d ago. She says now that cough is back. Coughing day and night. Less cough with prdnison and when prone. More cough when supine. It is dry. As I spoke to her and again inquierd about exposures they denied at first but later husband reminded her that the pillow is feather pillow. Been using it long time. Has sofa that has feather. Occ the feather comes out. Marland Kitchen EThe cough is perdnisone responsivle.    Med review does not show any ace inhibito but she is on ARB  She is on PPI  She is on singulair  Of note, noticed this past year she can only walk 2 of our 3 laps. She stps becaue of dyspnea but recent CT/PFT show stability. ECHO shows stable NICM 35%. She then told me she actually feels very dizzzy after 1-2 laps with [place]. This is ongoing x 1 year. I alerted Dr Margaretann Loveless - because there is no clear cut evidence of significant ILD decline - Dr Margaretann Loveless will see her this week  . Of note reg NICM - saw Dr Trula Slade on 08/02/21 and they are working on getting her a barostim as [place] 04/01/2022  Subjective:  Patient ID: Susan Hunt, female , DOB: 03/29/1945 , age 31 y.o. , MRN: [id number] ,  ADDRESS: [place] [address] PCP Leamon Arnt, MD Patient Care Team: Leamon Arnt, MD as PCP - General (Family Medicine) Elouise Munroe, MD as PCP - Cardiology (Cardiology) Deboraha Sprang, MD as PCP - Electrophysiology (Cardiology) Elroy Channel, MD as Referring Physician (Internal Medicine) Rockey Situ as Consulting Physician (Obstetrics and Gynecology) Trinda Pascal, FNP as Consulting Physician (Urology) Brand Males, MD as Consulting Physician (Pulmonary Disease)  This Provider for this visit: Treatment Team:  Attending Provider: Brand Males, MD    Follow-up idiopathic pulmonary fibrosis   HRCT   - June 2021 - alt dx NSIP v HP   - Sept 2021 - alt dx - Air trapping +  ? HP   - Last HRCT Feb 2023 - diagnosed on surgical lung biopsy 12/13/2019. -. UIP per our pathologst (note in 2023 blinded pathologist disagreed wit this) -   Diagnosis formally given on 12/27/2019.  - Also discussed MDD Jul 21, 2020  Commenced pirfenidone late October 2021/early November 2021.  - Screen failed for Pliant Part D trial due to soc coreg intake - Spring 2022.  -Screen for for [facility] inhaled spring 2023 treprostinil study after blinded pathologist did not agree with our UIP dx   04/01/2022 -   Chief Complaint  Patient presents with   Follow-up    No c/o    Filed Weights   04/01/22 1331  Weight: 199 lb 9.6 oz (90.5 kg)    HPI Susan Hunt 77 y.o. -returns for follow-up.  She says that she had bilateral stent implantation in October 2023.  Yesterday she had change in the settings.  She states with this the dizziness is resolved.  Her shortness of breath is dramatically improved.  See below.  Her cough is also improved.  She states that when she walks 200 yards oxygen transiently drops but quickly recovers.  Today and walking desaturation test she did desaturate but unlike in the past she did not stop earlier.  She only got dyspneic when she desaturated.  In the past there was confusion as to whether her symptoms were from cardiac or  pulmonary reasons but it is clear after the [facility] stim implantation that prior symptoms were from cardiac reasons.  Currently she does have stable dyspnea on exertion at a longer distance.  Review of the records indicate she did see cardiology heart failure PA yesterday Shirley Friar.  Device NP's were stable.  He noted the functional status is improved.  She has appointment coming up in 3 months to repeat the vital stim changes settings again.   SYMPTOM SCALE - ILD.td  08/19/2019  11/12/2019  12/27/2019 Last Weight  Most recent update: 12/27/2019 11:48 AM     Weight  89.7 kg (197 lb 12.8 oz)             02/04/2020 196#  04/02/2020 197# 07/23/2020 204# 01/04/2021 203# 02/11/2021 201# 04/29/2021 200# 07/22/2021 199# 08/04/2021 197# 04/01/2022 199# - sp barotstim + esberietnt  O2 use _0  _1  ra ra  Shortness of Breath 0 -> 5 scale with 5 being worst (score 6 If unable to do)             At rest _2 0 0  Simple tasks - showers, clothes change, eating, shaving _3 Household (dishes, doing  bed, laundry) _0 Shopping _1 Walking level at own pace _2 0 _3 Walking up Stairs _4 Total (30-36) Dyspnea Score _5 How bad is your cough? x _6 2  0H0o0w bad is your fatigue x _7 0 _8 0  How bad is nausea x _9 0  How bad is vomiting?  x _10 0  How bad is diarrhea? x _11 0  How bad is anxiety? x _12 0  How bad is depression x _13 0  Chronic pain    0 no  4 0 0 0 0 0     Simple office walk 185 feet x  3 laps goal with forehead probe 08/19/2019  11/12/2019  12/27/19 BRL 04/02/2020 gso - she has returned her portabl o2 07/23/2020  01/04/2021  04/29/2021   08/04/2021  04/01/2022   O2 used _14  ra ra ra  ra  Number laps completed Attempted 3 but desaturated at 2 Did alll 3 on this repeat walk - on beta blocker    2 of 3 laps 2 of 3 2 of 3 3 of 3  Comments about pace Normal pace  Nl pace avg avg slow avg avg avg  Resting Pulse Ox/HR 100% and 101/min 96% and 60/min 94% and 71/min 100% and 60 99% and 76 99% and HR  73 98% and 75 98% and HR 69 96% and HR 71  Final Pulse Ox/HR 86% and 116/min 94% and 80/min 96% and 87/min 94% ns 79 90% and HR 84 96% and HR 89 96% and HR 85 94% and HR 86 90% (87% at turn) and HR 90yes  Desaturated </= 88% yes no   no no no ni yes  Desaturated <= 3% points Yes , 14 no   Yes, 9 poins Yes, 3 points no Yes 4 points 6 points  Got Tachycardic >/= 90/min yes no   no Modrae dyspnea Mod dyspnea Mod dyspnea, dizzines  dyspea  Symptoms at end of test dyspneic mild none noon no      Miscellaneous comments Corrected with  2L [place] Will return o2 back to dme   no deconditined stabe Dizziness biggest issue       PFT     Latest Ref Rng & Units 07/22/2021    3:27 PM 04/29/2021    2:58 PM 08/23/2019   10:53 AM  PFT Results  FVC-Pre L 2.05  2.13  2.61   FVC-Predicted Pre % 65  67  80   FVC-Post L   2.57   FVC-Predicted Post %   79   Pre FEV1/FVC % % 86  88  87   Post FEV1/FCV % %   92   FEV1-Pre L 1.77  1.87  2.26   FEV1-Predicted Pre % 74  79  92  FEV1-Post L   2.36   DLCO uncorrected ml/min/mmHg 12.27  12.28  13.84   DLCO UNC% % 58  58  65   DLCO corrected ml/min/mmHg 12.27  12.28  13.76   DLCO COR %Predicted % 58  58  64   DLVA Predicted % 97  96  88   TLC L   4.15   TLC % Predicted %   75   RV % Predicted %   63        has a past medical history of Anemia, Asthma, CHF (congestive heart failure) ([place]), Depression, Diabetes mellitus without complication ([place]), Emphysema of lung ([place]), GERD (gastroesophageal reflux disease), Hemorrhoids, History of kidney stones, Hyperlipidemia, Hypertension, Hypothyroidism, Obesity, Pulmonary fibrosis ([place]), Rosacea, Sleep apnea, and  Tubular adenoma of colon (03/26/2019).   reports that she quit smoking about 8 years ago. Her smoking use included cigarettes. She has a 7.50 pack-year smoking history. She has never used smokeless tobacco.  Past Surgical History:  Procedure Laterality Date   ABDOMINAL HYSTERECTOMY     ANTERIOR AND POSTERIOR VAGINAL REPAIR  2020   CARDIAC CATHETERIZATION  11/01/2019   CATARACT EXTRACTION W/ INTRAOCULAR LENS IMPLANT Bilateral 2016   COLONOSCOPY     CYSTOSCOPY     DENTAL SURGERY  2016   Dental Implant   FRACTURE SURGERY Left 2013   Left ankle   INTERCOSTAL NERVE BLOCK Right 12/13/2019   Procedure: INTERCOSTAL NERVE BLOCK;  Surgeon: Melrose Nakayama, MD;  Location: [facility];  Service: Thoracic;  Laterality: Right;   LUNG BIOPSY Right 12/13/2019   Procedure: LUNG BIOPSY;  Surgeon: Melrose Nakayama, MD;  Location: [facility];  Service: Thoracic;  Laterality: Right;   LYMPH NODE BIOPSY Right 12/13/2019   Procedure: LYMPH NODE BIOPSY;  Surgeon: Melrose Nakayama, MD;  Location: [facility];  Service: Thoracic;  Laterality: Right;   medtronix nerve stimulator  07/18/2019   Complete InterStim Sacral Neuromudulation system implantation 07/18/19, [facility]   NASAL SINUS SURGERY     RIGHT/LEFT HEART CATH AND CORONARY ANGIOGRAPHY N/A 11/01/2019   Procedure: RIGHT/LEFT HEART CATH AND CORONARY ANGIOGRAPHY;  Surgeon: Martinique, Peter M, MD;  Location: [facility];  Service: Cardiovascular;  Laterality: N/A;   VAGINECTOMY, PARTIAL     VIDEO BRONCHOSCOPY N/A 12/13/2019   Procedure: VIDEO BRONCHOSCOPY;  Surgeon: Melrose Nakayama, MD;  Location: [facility];  Service: Thoracic;  Laterality: N/A;    Allergies  Allergen Reactions   Ace Inhibitors Cough   Clarithromycin     REACTION: GI sx   Codeine Nausea Only   Sulfamethoxazole Nausea Only   Telmisartan Other (See Comments)    sleepy    Immunization History  Administered Date(s) Administered   Fluad Quad(high Dose 65+) 12/09/2019, 12/11/2020    Hepatitis A, Adult 04/06/2018   Influenza Split 01/06/2009   Influenza, High Dose Seasonal PF 12/17/2014, 12/20/2015, 01/15/2017, 12/18/2017, 11/28/2018   Influenza, Seasonal, Injecte, Preservative Fre 01/07/2014, 12/17/2014   Influenza,inj,Quad PF,6+ Mos 01/15/2017   Influenza,trivalent, recombinat, inj, PF 12/03/2012   Influenza-Unspecified 01/15/2017   Moderna Covid-19 Vaccine Bivalent Booster 63yr & up 03/18/2021   Moderna SARS-COV2 Booster Vaccination 01/07/2020   Moderna Sars-Covid-2 Vaccination 04/17/2019, 05/15/2019, 08/20/2020   Pneumococcal Conjugate-13 01/07/2014   Pneumococcal Polysaccharide-23 11/23/2011   Td 03/14/2006, 10/16/2011   Zoster Recombinat (Shingrix) 10/21/2017, 12/18/2017   Zoster, Live 12/06/2013    Family History  Problem Relation Age of Onset   Heart disease Mother    Hypertension Mother  Osteoporosis Mother    Stroke Mother    Alcohol abuse Father    Diabetes Maternal Grandmother    Drug abuse Son    Breast cancer Neg Hx      Current Outpatient Medications:    amLODipine (NORVASC) 5 MG tablet, TAKE ONE TABLET BY MOUTH DAILY, Disp: 90 tablet, Rfl: 3   benzonatate (TESSALON) 200 MG capsule, Take 1 capsule (200 mg total) by mouth 3 (three) times daily as needed for cough., Disp: 30 capsule, Rfl: 1   carvedilol (COREG) 25 MG tablet, Take 1 tablet (25 mg total) by mouth 2 (two) times daily., Disp: 180 tablet, Rfl: 3   cetirizine (ZYRTEC) 10 MG tablet, Take 10 mg by mouth daily., Disp: , Rfl:    FARXIGA 10 MG TABS tablet, Take 1 tablet (10 mg total) by mouth daily., Disp: 90 tablet, Rfl: 3   levothyroxine (SYNTHROID) 125 MCG tablet, TAKE 1 TABLET BY MOUTH DAILY, Disp: 90 tablet, Rfl: 0   montelukast (SINGULAIR) 10 MG tablet, TAKE ONE TABLET BY MOUTH EVERY NIGHT AT BEDTIME, Disp: 90 tablet, Rfl: 1   Multiple Vitamin (MULTIVITAMIN WITH MINERALS) TABS tablet, Take 1 tablet by mouth daily., Disp: , Rfl:    omeprazole (PRILOSEC) 20 MG capsule, TAKE ONE  CAPSULE BY MOUTH DAILY ON AN EMPTY STOMACH, Disp: 90 capsule, Rfl: 3   ondansetron (ZOFRAN-ODT) 4 MG disintegrating tablet, DISSOLVE ONE TABLET BY MOUTH EVERY 8 HOURS AS NEEDED FOR NAUSEA/ VOMITING, Disp: 20 tablet, Rfl: 0   oxyCODONE-acetaminophen (PERCOCET) 5-325 MG tablet, Take 1 tablet by mouth every 6 (six) hours as needed for severe pain., Disp: 10 tablet, Rfl: 0   Pirfenidone (ESBRIET) 801 MG TABS, Take 801 mg by mouth with breakfast, with lunch, and with evening meal., Disp: 90 tablet, Rfl: 11   rosuvastatin (CRESTOR) 10 MG tablet, TAKE ONE TABLET BY MOUTH DAILY, Disp: 90 tablet, Rfl: 3   spironolactone (ALDACTONE) 25 MG tablet, TAKE 1 TABLET BY MOUTH DAILY, Disp: 90 tablet, Rfl: 3   valsartan (DIOVAN) 320 MG tablet, Take 1 tablet (320 mg total) by mouth daily., Disp: 90 tablet, Rfl: 1   venlafaxine XR (EFFEXOR-XR) 150 MG 24 hr capsule, TAKE ONE CAPSULE BY MOUTH DAILY, Disp: 90 capsule, Rfl: 3      Objective:   Vitals:   04/01/22 1331  BP: 108/64  Pulse: 71  Temp: 97.7 F (36.5 C)  TempSrc: Oral  SpO2: 96%  Weight: 199 lb 9.6 oz (90.5 kg)  Height: _0  (1.702 m)    Estimated body mass index is 31.26 kg/m as calculated from the following:   Height as of this encounter: _1  (1.702 m).   Weight as of this encounter: 199 lb 9.6 oz (90.5 kg).  _2 @  Filed Weights   04/01/22 1331  Weight: 199 lb 9.6 oz (90.5 kg)     Physical Exam    General: No distress. Looks well Neuro: Alert and Oriented x 3. GCS 15. Speech normal Psych: Pleasant Resp:  Barrel Chest - no.  Wheeze - no, Crackles - yes, No overt respiratory distress CVS: Normal heart sounds. Murmurs - no Ext: Stigmata of Connective Tissue Disease - no HEENT: Normal upper airway. PEERL +. No post nasal drip        Assessment:       ICD-10-CM   1. IPF (idiopathic pulmonary fibrosis) ([place])  J84.112     2. ILD (interstitial lung disease) ([facility])  J84.9 Hepatic function panel    3. Dyspnea on  exertion  R06.09     4. Dizziness  R42     5. Chronic cough  R05.3     6. Encounter for medication monitoring  Z51.81          Plan:     Patient Instructions     ICD-10-CM   1. IPF (idiopathic pulmonary fibrosis) ([place])  J84.112     2. ILD (interstitial lung disease) ([place])  J84.9     3. Chronic cough  R05.3     4. Dyspnea on exertion  R06.09     5. Dizziness  R42       Your symptoms are dramatically better after the better stent placement including shortness of breath and cough and dizziness.  Currently you are being limited by your pulmonary fibrosis and not your heart  Plan -Use oxygen for any exertion greater than 200 yards that will drop your pulse ox less than 88% -Continue pirfenidone per schedule -Check liver function test today -12 weeks do spirometry and DLCO  Followup  - 12 weeks do spirometry and dlco and return for 30 min vsiit  -Symptoms: Walking desaturation test on follow-up   Moderate Complexity MDM NEW OFFICE  The table below is from the 2021 E/M guidelines, first released in 2021, with minor revisions added in 2023. Must meet the requirements for 2 out of 3 dimensions to qualify.    Number and complexity of problems addressed Amount and/or complexity of data reviewed Risk of complications and/or morbidity  One or more chronic illness with mild exacerbation, progression, or side effects of treatment  Two or more stable chronic illnesses  One undiagnosed new problem with uncertain prognosis  One acute illness with systemic symptoms   Acute complicated injury Must meet the requirements for 1 of 3 of the categories)  Category 1: Tests and documents, historian  Any combination of 3 of the following:  Assessment requiring an independent historian  Review of prior external records  Review of results of each unique test  Ordering of each unique test    Category 2: Interpretation of tests  Independent interpretation of a test  perfromed by another physician/NPP  Category 3: Discuss management/tests  Discussion of magagement or tests with an external physician/NPP Prescription drug management  Decision regarding minor surgery with identfied patient or procedure risk factors  Decision regarding elective major surgery without identified patient or procedure risk factors  Diagnosis or treatment significantly limited by social determinants of health             SIGNATURE    Dr. Brand Males, M.D., F.C.C.P,  Pulmonary and Critical Care Medicine Staff Physician, [place] Director - Interstitial Lung Disease  Program  [facility] [place], [place], [address]  Pager: [phone], If no answer or between  15:00h - 7:00h: call [phone] Telephone: [phone]  1:53 PM 04/01/2022

## 2022-04-01 NOTE — Patient Instructions (Addendum)
ICD-10-CM   1. IPF (idiopathic pulmonary fibrosis) ([place])  J84.112     2. ILD (interstitial lung disease) ([facility])  J84.9     3. Chronic cough  R05.3     4. Dyspnea on exertion  R06.09     5. Dizziness  R42       Your symptoms are dramatically better after the better stent placement including shortness of breath and cough and dizziness.  Currently you are being limited by your pulmonary fibrosis and not your heart  Plan -Use oxygen for any exertion greater than 200 yards that will drop your pulse ox less than 88%   - use your personal innogen -Continue pirfenidone per schedule -Check liver function test today -12 weeks do spirometry and DLCO  Followup  - 12 weeks do spirometry and dlco and return for 30 min vsiit  -Symptoms: Walking desaturation test on follow-up

## 2022-04-03 ENCOUNTER — Other Ambulatory Visit: Payer: Self-pay | Admitting: Family Medicine

## 2022-04-14 ENCOUNTER — Other Ambulatory Visit: Payer: Self-pay | Admitting: Family Medicine

## 2022-05-02 ENCOUNTER — Other Ambulatory Visit: Payer: Self-pay | Admitting: Family Medicine

## 2022-05-12 LAB — HM DIABETES EYE EXAM

## 2022-05-18 ENCOUNTER — Encounter: Payer: Self-pay | Admitting: Family Medicine

## 2022-05-19 ENCOUNTER — Encounter: Payer: Self-pay | Admitting: Student

## 2022-05-19 ENCOUNTER — Ambulatory Visit: Payer: Medicare Other | Attending: Student | Admitting: Student

## 2022-05-19 ENCOUNTER — Encounter: Payer: Medicare Other | Admitting: *Deleted

## 2022-05-19 VITALS — BP 110/64 | HR 71 | Ht 67.0 in | Wt 194.4 lb

## 2022-05-19 DIAGNOSIS — I10 Essential (primary) hypertension: Secondary | ICD-10-CM

## 2022-05-19 DIAGNOSIS — Z006 Encounter for examination for normal comparison and control in clinical research program: Secondary | ICD-10-CM

## 2022-05-19 DIAGNOSIS — J84112 Idiopathic pulmonary fibrosis: Secondary | ICD-10-CM | POA: Diagnosis not present

## 2022-05-19 DIAGNOSIS — I5022 Chronic systolic (congestive) heart failure: Secondary | ICD-10-CM

## 2022-05-19 NOTE — Research (Signed)
REBALANCE Informed Consent   Subject Name: Susan Hunt  Subject met inclusion and exclusion criteria.  The informed consent form, study requirements and expectations were reviewed with the subject and questions and concerns were addressed prior to the signing of the consent form.  The subject verbalized understanding of the trial requirements.  The subject agreed to participate in the REBALANCE trial and signed the informed consent on 05-19-2022.  The informed consent was obtained prior to performance of any protocol-specific procedures for the subject.  A copy of the signed informed consent was given to the subject and a copy was placed in the subject's medical record.   [place] Anyi Fels, Research Coordinator 05/19/2022  14:31

## 2022-05-19 NOTE — Patient Instructions (Signed)
Medication Instructions:   .Your physician recommends that you continue on your current medications as directed. Please refer to the Current Medication list given to you today.   *If you need a refill on your cardiac medications before your next appointment, please call your pharmacy*   Lab Work:  None ordered.  If you have labs (blood work) drawn today and your tests are completely normal, you will receive your results only by: [facility] (if you have MyChart) OR A paper copy in the mail If you have any lab test that is abnormal or we need to change your treatment, we will call you to review the results.   Testing/Procedures:  None ordered.   Follow-Up: At [facility], you and your health needs are our priority.  As part of our continuing mission to provide you with exceptional heart care, we have created designated Provider Care Teams.  These Care Teams include your primary Cardiologist (physician) and Advanced Practice Providers (APPs -  Physician Assistants and Nurse Practitioners) who all work together to provide you with the care you need, when you need it.  We recommend signing up for the patient portal called "MyChart".  Sign up information is provided on this After Visit Summary.  MyChart is used to connect with patients for Virtual Visits (Telemedicine).  Patients are able to view lab/test results, encounter notes, upcoming appointments, etc.  Non-urgent messages can be sent to your provider as well.   To learn more about what you can do with MyChart, go to [email or web address].    Your next appointment:   5 month(s)  Provider:   Legrand Como "Oda Kilts, PA-C

## 2022-05-19 NOTE — Progress Notes (Signed)
  Cardiology Office Note:   Date:  05/19/2022  ID:  Susan Hunt, DOB 1945-08-25, MRN [id number]  Primary Cardiologist: Elouise Munroe, MD Primary Electrophysiology: Virl Axe, MD  History of Present Illness:   Susan Hunt is a 77 y.o. female with DMT, HTN, HLD, Hypothyroid,mild OSA not on CPAP, ILD, and HFrEF s/p Barostim implantation 12/2021 seen today for routine electrophysiology followup.   Since last being seen in our clinic the patient reports doing about the same. Shortly after our last visit she was started on O2. She has been using at night, but rarely during the day (only when she walks her dog). O2 sat 91% when she walked in to clinic today. She is frustrated as she felt she had more benefit from the barostim at first, and is now having SOB. Denies undue fatigue.   Device History: Barostim (standard) implanted 12/30/2021 for Chronic systolic CHF   Review of systems complete and found to be negative unless listed in HPI.    Studies Reviewed:    EKG is not ordered today  Barostim Interrogation- Performed personally and reviewed in detail today,  See scanned report  Risk Assessment/Calculations:             Physical Exam:   VS:  BP 110/64   Pulse 71   Ht '5\' 7"'$  (1.702 m)   Wt 194 lb 6.4 oz (88.2 kg)   SpO2 91%   BMI 30.45 kg/m    Wt Readings from Last 3 Encounters:  05/19/22 194 lb 6.4 oz (88.2 kg)  04/01/22 199 lb 9.6 oz (90.5 kg)  02/17/22 197 lb 6.4 oz (89.5 kg)     GEN: Well nourished, well developed in no acute distress NECK: No JVD; No carotid bruits CARDIAC: Regular rate and rhythm, no murmurs, rubs, gallops RESPIRATORY:  Clear to auscultation without rales, wheezing or rhonchi  ABDOMEN: Soft, non-tender, non-distended EXTREMITIES:  No edema; No deformity   ASSESSMENT AND PLAN:    Chronic systolic CHF s/p Barostim implantation NYHA III symptoms  Device titrated from 9.8 mA to 12.0 mA @ 65 pulse width. She has had intermittent stim, but  not bothersome or painful and she wishes to not "decrease" therapy. Device impedence stable.  Pt goals are to continue to increase her activity.   Normal device function See scanned report. Previously considered her at max programming. Will see her back in 6 months to reassess.   HTN Stable on current regimen   ILD Reassurance given.  She needs to use her O2 as directed, at night, and anytime she ambulates >200yards, OR if sats 88%.      Disposition:   Follow up with me in in 6 months  Signed, Shirley Friar, PA-C

## 2022-06-01 ENCOUNTER — Ambulatory Visit: Payer: Medicare Other | Attending: Student | Admitting: Student

## 2022-06-01 DIAGNOSIS — I5022 Chronic systolic (congestive) heart failure: Secondary | ICD-10-CM

## 2022-06-01 NOTE — Progress Notes (Signed)
Pt called with stim since programming change at 3/7.   2 days after visit noted buzzing in her neck that intermittently radiates to jaw.   Not reproducible today. She has not noted any change in her HF symptoms, so we will program back to prior settings of 9.8 ma @ 65 mus.   Keep previous follow up.   Legrand Como [address] [place], PA-C  06/01/2022 10:56 AM

## 2022-06-04 ENCOUNTER — Other Ambulatory Visit: Payer: Self-pay | Admitting: Family Medicine

## 2022-06-04 DIAGNOSIS — I10 Essential (primary) hypertension: Secondary | ICD-10-CM

## 2022-06-13 ENCOUNTER — Ambulatory Visit: Payer: Medicare Other | Admitting: Internal Medicine

## 2022-06-13 DIAGNOSIS — N3000 Acute cystitis without hematuria: Secondary | ICD-10-CM | POA: Diagnosis not present

## 2022-07-04 ENCOUNTER — Other Ambulatory Visit: Payer: Self-pay | Admitting: Family Medicine

## 2022-07-04 DIAGNOSIS — I10 Essential (primary) hypertension: Secondary | ICD-10-CM

## 2022-07-06 ENCOUNTER — Other Ambulatory Visit: Payer: Self-pay | Admitting: Internal Medicine

## 2022-07-08 ENCOUNTER — Other Ambulatory Visit: Payer: Self-pay

## 2022-07-08 ENCOUNTER — Encounter: Payer: Self-pay | Admitting: Internal Medicine

## 2022-07-08 ENCOUNTER — Ambulatory Visit: Payer: Medicare Other | Attending: Internal Medicine | Admitting: Internal Medicine

## 2022-07-08 VITALS — BP 128/76 | HR 70 | Ht 68.0 in | Wt 196.0 lb

## 2022-07-08 DIAGNOSIS — I5022 Chronic systolic (congestive) heart failure: Secondary | ICD-10-CM | POA: Diagnosis not present

## 2022-07-08 MED ORDER — VALSARTAN 320 MG PO TABS: 320.0000 mg | ORAL_TABLET | Freq: Every day | ORAL | 3 refills | Status: DC

## 2022-07-08 NOTE — Progress Notes (Signed)
Cardiology Office Note:    Date:  07/08/2022  ID:  Susan Hunt, DOB 1945/10/26, MRN [id number]  PCP:  Willow Ora, MD  Cardiologist:  Parke Poisson, MD  Electrophysiologist:  Sherryl Manges, MD    Referring MD: Willow Ora, MD   Chief Complaint/Reason for Referral: Follow-up  History of Present Illness:    Susan Hunt is a 77 y.o. female with a history of nonischemic cardiomyopathy with normal right and left heart pressures, hypertension, hyperlipidemia, interstitial lung disease with pulmonary fibrosis. NICM currently felt to be related to HTN. LVEF 41% on cardiac MRI.   Heart Failure Therapy currently ACE-I/ARB/ARNI: Valsartan 320 mg daily BB: carvedilol 25 mg BID, transitioned from toprol xl for better BP control. MRA:  spironolactone 75 mg daily SGLT2I: farxiga 10 mg daily   Also on amlodipine 5 mg daily for HTN.    Previously tried:  Entresto 49/51 mg (dry/itchy mouth, dry eyes) Lisinopril-hctz (SOB, coughing) Metoprolol tartrate (SOB, coughing) Clonidine   She was previously class II dyspnea but at her 07/2021 visit endorsed class III symptoms, with cough and significant DOE. No LE edema. She had started prednisone the day before for presumed ILD flare and felt better the next day.    At her last visit, she reported unchanged SOB. She had started using oxygen at night. She confirmed approval for her barostim that was scheduled for 12/30/2021. She was experiencing "a lot" of fatigue during the day. Her blood pressure was well controlled in the office. We continue to work with the assumption that her cardiomyopathy is hypertensive in origin.  Seen by Maxine Glenn, PA-C 01/18/2022 for her initial barostim titration. Barostim (standard) implanted 12/30/2021 for Chronic systolic CHF.   She was most recently seen by Otilio Saber, PA-C on 06/01/2022. She had previously complained of "buzzing" in her neck that was not reproducible. She denied any changes in  her heart failure symptoms.  Today, she states she is feeling much better since the barostim. She started noticing improvement after one week. She has been able to walk through her neighborhood with her dogs, although she is still short of breath by the time she returns home. If she wears oxygen while walking now, she does not notice much difference. She does have difficulty with some activities such as bending over working in the garden.   She denies any issues with LE edema recently. Her blood pressure is well controlled in clinic at 128/76.  When she refilled [place] she discovered she was back on 10 mg when she thought she should be on 20 mg. As of 12/2021 her A1c was 6.3.  We participated in shared decision making and determined that since I cannot clearly identify when the dose was adjusted to 20 mg or by who, I will have her review this with her primary care physician who may adjust the dose for diabetes dosing without barrier from a cardiac standpoint.  For now we will continue Farxiga 10 mg daily.  She denies any palpitations, chest pain, lightheadedness, headaches, syncope, orthopnea, or PND.  Past Medical History:  Diagnosis Date   Anemia    Asthma    "I dont think I have it.   CHF (congestive heart failure) (HCC)    Depression    Diabetes mellitus without complication (HCC)    type 2   Emphysema of lung (HCC)    GERD (gastroesophageal reflux disease)    Hemorrhoids    History of kidney stones  Cystoscopy   Hyperlipidemia    Hypertension    Hypothyroidism    Obesity    Pulmonary fibrosis (HCC)    Rosacea    Sleep apnea    mild, no need for CPAP   Tubular adenoma of colon 03/26/2019   Colonoscopy repeat in 3 years    Past Surgical History:  Procedure Laterality Date   ABDOMINAL HYSTERECTOMY     ANTERIOR AND POSTERIOR VAGINAL REPAIR  2020   CARDIAC CATHETERIZATION  11/01/2019   CATARACT EXTRACTION W/ INTRAOCULAR LENS IMPLANT Bilateral 2016   COLONOSCOPY      CYSTOSCOPY     DENTAL SURGERY  2016   Dental Implant   FRACTURE SURGERY Left 2013   Left ankle   INTERCOSTAL NERVE BLOCK Right 12/13/2019   Procedure: INTERCOSTAL NERVE BLOCK;  Surgeon: Loreli Slot, MD;  Location: [facility] OR;  Service: Thoracic;  Laterality: Right;   LUNG BIOPSY Right 12/13/2019   Procedure: LUNG BIOPSY;  Surgeon: Loreli Slot, MD;  Location: [facility] OR;  Service: Thoracic;  Laterality: Right;   LYMPH NODE BIOPSY Right 12/13/2019   Procedure: LYMPH NODE BIOPSY;  Surgeon: Loreli Slot, MD;  Location: [facility] OR;  Service: Thoracic;  Laterality: Right;   medtronix nerve stimulator  07/18/2019   Complete InterStim Sacral Neuromudulation system implantation 07/18/19, [facility]   NASAL SINUS SURGERY     RIGHT/LEFT HEART CATH AND CORONARY ANGIOGRAPHY N/A 11/01/2019   Procedure: RIGHT/LEFT HEART CATH AND CORONARY ANGIOGRAPHY;  Surgeon: [place], Peter M, MD;  Location: [facility] INVASIVE CV LAB;  Service: Cardiovascular;  Laterality: N/A;   VAGINECTOMY, PARTIAL     VIDEO BRONCHOSCOPY N/A 12/13/2019   Procedure: VIDEO BRONCHOSCOPY;  Surgeon: Loreli Slot, MD;  Location: [facility] OR;  Service: Thoracic;  Laterality: N/A;    Current Medications: Current Meds  Medication Sig   amLODipine (NORVASC) 5 MG tablet TAKE 1 TABLET BY MOUTH DAILY   carvedilol (COREG) 25 MG tablet Take 1 tablet (25 mg total) by mouth 2 (two) times daily.   cetirizine (ZYRTEC) 10 MG tablet Take 10 mg by mouth daily.   FARXIGA 10 MG TABS tablet Take 1 tablet (10 mg total) by mouth daily.   levothyroxine (SYNTHROID) 125 MCG tablet TAKE 1 TABLET BY MOUTH DAILY. PATIENT NEEDS APPOINTMENT FOR REFILLS   montelukast (SINGULAIR) 10 MG tablet TAKE ONE TABLET BY MOUTH EVERY NIGHT AT BEDTIME   Multiple Vitamin (MULTIVITAMIN WITH MINERALS) TABS tablet Take 1 tablet by mouth daily.   omeprazole (PRILOSEC) 20 MG capsule TAKE ONE CAPSULE BY MOUTH DAILY ON AN EMPTY STOMACH   ondansetron (ZOFRAN-ODT) 4 MG disintegrating  tablet DISSOLVE ONE TABLET BY MOUTH EVERY 8 HOURS AS NEEDED FOR NAUSEA/ VOMITING   Pirfenidone (ESBRIET) 801 MG TABS Take 801 mg by mouth with breakfast, with lunch, and with evening meal.   rosuvastatin (CRESTOR) 10 MG tablet TAKE ONE TABLET BY MOUTH DAILY   spironolactone (ALDACTONE) 25 MG tablet TAKE 1 TABLET BY MOUTH DAILY   valsartan (DIOVAN) 320 MG tablet Take 1 tablet (320 mg total) by mouth daily.   venlafaxine XR (EFFEXOR-XR) 150 MG 24 hr capsule TAKE ONE CAPSULE BY MOUTH DAILY     Allergies:   Ace inhibitors, Clarithromycin, Codeine, Sulfamethoxazole, and Telmisartan   Social History   Tobacco Use   Smoking status: Former    Packs/day: 0.50    Years: 15.00    Additional pack years: 0.00    Total pack years: 7.50    Types: Cigarettes  Quit date: 08/18/2013    Years since quitting: 8.8   Smokeless tobacco: Never  Vaping Use   Vaping Use: Never used  Substance Use Topics   Alcohol use: Not Currently    Comment: 2-3 glasses of wine weekly   Drug use: No     Family History: The patient's family history includes Alcohol abuse in her father; Diabetes in her maternal grandmother; Drug abuse in her son; Heart disease in her mother; Hypertension in her mother; Osteoporosis in her mother; Stroke in her mother. There is no history of Breast cancer.  ROS:   Please see the history of present illness.   (+) Shortness of breath All other systems reviewed and are negative.  EKGs/Labs/Other Studies Reviewed:    The following studies were reviewed today:  Chest CT 10/13/2021: IMPRESSION: 1. Unchanged moderate pulmonary fibrosis in a pattern without clear apical to basal gradient, featuring irregular peripheral interstitial opacity, septal thickening, and areas of subpleural bronchiolectasis scattered throughout the lungs, however without evidence of honeycombing. Findings remain consistent with a "probable UIP" pattern and in keeping with reported wedge biopsy diagnosis of  UIP/IPF. 2. Coronary artery disease.   Aortic Atherosclerosis (ICD10-I70.0).  Cardiopulmonary Exercise Test  08/26/2021: Conclusion: The interpretation of this test is limited due to submaximal effort during the exercise. Based on available data, exercise testing with gas exchange demonstrates low-normal functional capacity when compared to matched sedentary norms. There is at least a moderate HF limitation with moderately elevated VE/VCO2 slope and desaturations at peak exercise, which was submaximal effort that can be suggestive of elevated pulmonary pressures.   Bilateral Carotid Doppler 08/02/2021: Summary:  Right Carotid: Velocities in the right ICA are consistent with a 1-39%  stenosis.   Left Carotid: Velocities in the left ICA are consistent with a 1-39%  stenosis.   Vertebrals:  Bilateral vertebral arteries demonstrate antegrade flow.  Subclavians: Normal flow hemodynamics were seen in bilateral subclavian arteries.   Echo 05/26/2021: IMPRESSIONS   1. Left ventricular ejection fraction, by estimation, is 30 to 35%. The  left ventricle has moderately decreased function. The left ventricle  demonstrates global hypokinesis. Left ventricular diastolic parameters are  consistent with Grade I diastolic  dysfunction (impaired relaxation).   2. Right ventricular systolic function is mildly reduced. The right  ventricular size is normal. There is normal pulmonary artery systolic  pressure.   3. Left atrial size was moderately dilated.   4. Right atrial size was mildly dilated.   5. The mitral valve is normal in structure. Trivial mitral valve  regurgitation. No evidence of mitral stenosis.   6. The aortic valve is tricuspid. There is mild calcification of the  aortic valve. Aortic valve regurgitation is not visualized. Aortic valve  sclerosis/calcification is present, without any evidence of aortic  stenosis.   7. The inferior vena cava is normal in size with greater than 50%   respiratory variability, suggesting right atrial pressure of 3 mmHg.   EKG:  EKG is personally reviewed. 07/08/2022:  NSR. Nonspecific IVCD.  There is notable artifact from her Barostim device which is unable to be adjusted in the clinic today.  Does not significantly hinder interpretation 12/09/2021: EKG was not ordered.  08/06/2021: NSR, ST T wave abnl anterior.  Imaging studies that I have independently reviewed today: n/a  Recent Labs: 11/02/2021: B Natriuretic Peptide 16.8 12/22/2021: Hemoglobin 15.5; Platelets 236 02/17/2022: BUN 14; Creatinine, Ser 1.04; NT-Pro BNP 103; Potassium 4.9; Sodium 140 04/01/2022: ALT 14   Recent Lipid Panel  Component Value Date/Time   CHOL 159 12/11/2020 1208   TRIG 163 (H) 12/11/2020 1208   HDL 52 12/11/2020 1208   CHOLHDL 3.1 12/11/2020 1208   VLDL 28.4 07/19/2019 1333   LDLCALC 81 12/11/2020 1208   LDLDIRECT 146.7 05/29/2006 1115    Physical Exam:    VS:  BP 128/76   Pulse 70   Ht 5\' 8"  (1.727 m)   Wt 196 lb (88.9 kg)   SpO2 94%   BMI 29.80 kg/m     Wt Readings from Last 5 Encounters:  07/08/22 196 lb (88.9 kg)  05/19/22 194 lb 6.4 oz (88.2 kg)  04/01/22 199 lb 9.6 oz (90.5 kg)  02/17/22 197 lb 6.4 oz (89.5 kg)  01/18/22 196 lb 9.6 oz (89.2 kg)    Constitutional: No acute distress Eyes: sclera non-icteric, normal conjunctiva and lids ENMT: normal dentition, moist mucous membranes Cardiovascular: regular rhythm, normal rate, no murmur. S1 and S2 normal. No jugular venous distention. Right pectoral barostim incision healed well. Respiratory: Fine diffuse crackles. GI : normal bowel sounds, soft and nontender. No distention.   MSK: extremities warm, well perfused. No edema.  NEURO: grossly nonfocal exam, moves all extremities. PSYCH: alert and oriented x 3, normal mood and affect.   ASSESSMENT:    1. Chronic systolic heart failure (HCC)      PLAN:    Chronic combined systolic and diastolic heart failure Nonischemic  cardiomyopathy (HCC) Essential hypertension Barostim implant -She has undergone implant of Barostim device with significant improvement in symptoms.  She has gone from NYHA class III symptoms to NYHA class I-II symptoms, remarkable improvement. -EF is approximately 41% on MRI.   -We will repeat an echocardiogram given the improvement in symptoms per patient preference.  Unsure if there will be a change in ejection fraction but symptoms do seem much better managed with device implant.  Followed closely by electrophysiology Otilio Saber PA -Heart Failure Therapy  ACE-I/ARB/ARNI: Valsartan 320 mg daily BB: carvedilol 25 mg BID MRA:  spironolactone 25mg  daily, decreased due to renal function. SGLT2I: farxiga 10 mg daily Loop diuretic: lasix 40 mg prn Also on amlodipine 5 mg daily for HTN. BP normal today.   CAD, nonobstructive - cont asa and statin Mixed hyperlipidemia - well treated with crestor 10 mg daily.   Follow-up in 1 year.  Total time of encounter: 30 minutes total time of encounter, including 20 minutes spent in face-to-face patient care on the date of this encounter. This time includes coordination of care and counseling regarding above mentioned problem list. Remainder of non-face-to-face time involved reviewing chart documents/testing relevant to the patient encounter and documentation in the medical record. I have independently reviewed documentation from referring provider.   Weston Brass, MD, [facility] [place]  [facility]   Medication Adjustments/Labs and Tests Ordered: Current medicines are reviewed at length with the patient today.  Concerns regarding medicines are outlined above.   Orders Placed This Encounter  Procedures   EKG 12-Lead   ECHOCARDIOGRAM COMPLETE   No orders of the defined types were placed in this encounter.  Patient Instructions  Medication Instructions:  No Changes In Medications at this time.  *If you need a refill on your cardiac  medications before your next appointment, please call your pharmacy*  Lab Work: None Ordered At This Time.  If you have labs (blood work) drawn today and your tests are completely normal, you will receive your results only by: MyChart Message (if you have MyChart) OR A  paper copy in the mail If you have any lab test that is abnormal or we need to change your treatment, we will call you to review the results.  Testing/Procedures: Your physician has requested that you have an echocardiogram. Echocardiography is a painless test that uses sound waves to create images of your heart. It provides your doctor with information about the size and shape of your heart and how well your heart's chambers and valves are working. You may receive an ultrasound enhancing agent through an IV if needed to better visualize your heart during the echo.This procedure takes approximately one hour. There are no restrictions for this procedure. This will take place at the [address], [address].   Follow-Up: At [facility], you and your health needs are our priority.  As part of our continuing mission to provide you with exceptional heart care, we have created designated Provider Care Teams.  These Care Teams include your primary Cardiologist (physician) and Advanced Practice Providers (APPs -  Physician Assistants and Nurse Practitioners) who all work together to provide you with the care you need, when you need it.  Your next appointment:   1 year(s)  Provider:   Parke Poisson, MD       [facility] [place],acting as a scribe for Parke Poisson, MD.,have documented all relevant documentation on the behalf of Parke Poisson, MD,as directed by  Parke Poisson, MD while in the presence of Parke Poisson, MD.   I, Parke Poisson, MD, have reviewed all documentation for the visit on 07/08/2022. The documentation on today's date of service for the exam, diagnosis, procedures, and orders are  all accurate and complete.

## 2022-07-08 NOTE — Patient Instructions (Signed)
Medication Instructions:  No Changes In Medications at this time.  *If you need a refill on your cardiac medications before your next appointment, please call your pharmacy*  Lab Work: None Ordered At This Time.  If you have labs (blood work) drawn today and your tests are completely normal, you will receive your results only by: MyChart Message (if you have MyChart) OR A paper copy in the mail If you have any lab test that is abnormal or we need to change your treatment, we will call you to review the results.  Testing/Procedures: Your physician has requested that you have an echocardiogram. Echocardiography is a painless test that uses sound waves to create images of your heart. It provides your doctor with information about the size and shape of your heart and how well your heart's chambers and valves are working. You may receive an ultrasound enhancing agent through an IV if needed to better visualize your heart during the echo.This procedure takes approximately one hour. There are no restrictions for this procedure. This will take place at the [address], [address].   Follow-Up: At [facility], you and your health needs are our priority.  As part of our continuing mission to provide you with exceptional heart care, we have created designated Provider Care Teams.  These Care Teams include your primary Cardiologist (physician) and Advanced Practice Providers (APPs -  Physician Assistants and Nurse Practitioners) who all work together to provide you with the care you need, when you need it.  Your next appointment:   1 year(s)  Provider:   Gayatri A Acharya, MD    

## 2022-07-13 ENCOUNTER — Other Ambulatory Visit: Payer: Self-pay | Admitting: Family Medicine

## 2022-08-01 ENCOUNTER — Other Ambulatory Visit: Payer: Self-pay | Admitting: Family Medicine

## 2022-08-01 ENCOUNTER — Ambulatory Visit: Payer: Medicare Other | Admitting: Internal Medicine

## 2022-08-01 DIAGNOSIS — I10 Essential (primary) hypertension: Secondary | ICD-10-CM

## 2022-08-05 ENCOUNTER — Ambulatory Visit (INDEPENDENT_AMBULATORY_CARE_PROVIDER_SITE_OTHER): Payer: Medicare Other | Admitting: Internal Medicine

## 2022-08-05 ENCOUNTER — Ambulatory Visit (HOSPITAL_COMMUNITY): Payer: Medicare Other | Attending: Internal Medicine

## 2022-08-05 DIAGNOSIS — J84112 Idiopathic pulmonary fibrosis: Secondary | ICD-10-CM | POA: Diagnosis not present

## 2022-08-05 LAB — PULMONARY FUNCTION TEST
DL/VA % pred: 76 %
DL/VA: 3.06 ml/min/mmHg/L
DLCO cor % pred: 43 %
DLCO cor: 9.15 ml/min/mmHg
DLCO unc % pred: 43 %
DLCO unc: 9.15 ml/min/mmHg
FEF 25-75 Pre: 2.75 L/sec
FEF2575-%Pred-Pre: 158 %
FEV1-%Pred-Pre: 75 %
FEV1-Pre: 1.75 L
FEV1FVC-%Pred-Pre: 117 %
FEV6-%Pred-Pre: 67 %
FEV6-Pre: 2 L
FEV6FVC-%Pred-Pre: 104 %
FVC-%Pred-Pre: 64 %
FVC-Pre: 2.01 L
Pre FEV1/FVC ratio: 87 %
Pre FEV6/FVC Ratio: 99 %

## 2022-08-05 NOTE — Progress Notes (Signed)
Spirometry and DLCO completed today  ?

## 2022-08-09 ENCOUNTER — Encounter (HOSPITAL_COMMUNITY): Payer: Self-pay | Admitting: Internal Medicine

## 2022-08-10 ENCOUNTER — Ambulatory Visit: Payer: Medicare Other | Admitting: Internal Medicine

## 2022-08-10 DIAGNOSIS — L820 Inflamed seborrheic keratosis: Secondary | ICD-10-CM | POA: Diagnosis not present

## 2022-08-10 DIAGNOSIS — L84 Corns and callosities: Secondary | ICD-10-CM | POA: Diagnosis not present

## 2022-08-10 DIAGNOSIS — L814 Other melanin hyperpigmentation: Secondary | ICD-10-CM | POA: Diagnosis not present

## 2022-08-10 DIAGNOSIS — D225 Melanocytic nevi of trunk: Secondary | ICD-10-CM | POA: Diagnosis not present

## 2022-08-10 DIAGNOSIS — L821 Other seborrheic keratosis: Secondary | ICD-10-CM | POA: Diagnosis not present

## 2022-08-11 ENCOUNTER — Encounter (HOSPITAL_COMMUNITY): Payer: Self-pay

## 2022-08-15 NOTE — Progress Notes (Signed)
Lung funcion declined copared to 1  year ago and 2 years ago. Will discuss more at followup. Pls let her know

## 2022-08-17 DIAGNOSIS — R0781 Pleurodynia: Secondary | ICD-10-CM | POA: Diagnosis not present

## 2022-08-17 DIAGNOSIS — S2242XA Multiple fractures of ribs, left side, initial encounter for closed fracture: Secondary | ICD-10-CM | POA: Diagnosis not present

## 2022-08-29 ENCOUNTER — Encounter: Payer: Self-pay | Admitting: Internal Medicine

## 2022-09-01 ENCOUNTER — Other Ambulatory Visit: Payer: Self-pay | Admitting: Family Medicine

## 2022-09-01 DIAGNOSIS — I10 Essential (primary) hypertension: Secondary | ICD-10-CM

## 2022-09-07 ENCOUNTER — Ambulatory Visit (HOSPITAL_COMMUNITY): Payer: Medicare Other

## 2022-09-07 DIAGNOSIS — M21372 Foot drop, left foot: Secondary | ICD-10-CM | POA: Diagnosis not present

## 2022-09-07 DIAGNOSIS — M545 Low back pain, unspecified: Secondary | ICD-10-CM | POA: Diagnosis not present

## 2022-09-07 DIAGNOSIS — S2231XD Fracture of one rib, right side, subsequent encounter for fracture with routine healing: Secondary | ICD-10-CM | POA: Diagnosis not present

## 2022-09-09 ENCOUNTER — Encounter: Payer: Self-pay | Admitting: Internal Medicine

## 2022-09-09 ENCOUNTER — Ambulatory Visit: Payer: Medicare Other | Admitting: Internal Medicine

## 2022-09-09 VITALS — BP 130/70 | HR 70 | Ht 68.0 in | Wt 197.2 lb

## 2022-09-09 DIAGNOSIS — R0609 Other forms of dyspnea: Secondary | ICD-10-CM

## 2022-09-09 DIAGNOSIS — Z5181 Encounter for therapeutic drug level monitoring: Secondary | ICD-10-CM | POA: Diagnosis not present

## 2022-09-09 DIAGNOSIS — J84112 Idiopathic pulmonary fibrosis: Secondary | ICD-10-CM

## 2022-09-09 NOTE — Progress Notes (Signed)
OV 08/19/2019  Subjective:  Patient ID: Susan Hunt, female , DOB: [birth year] , age 77 y.o. , MRN: [id number] , ADDRESS: [place] [address]   08/19/2019 -   Chief Complaint  Patient presents with   Consult    COPD, emphysema.  sob with exertion.  dry coughs alot at night     HPI Susan Hunt 77 y.o. -she is a retired Marine scientist.  She used to work at the [employer] across [place] and having retired from that 10 years ago and then she worked for another agency apparently having retired from all duties few years ago.  Husband is a retired Software engineer.  They are here for shortness of breath.  She tells me that she has had insidious onset of shortness of breath and cough that started 1 year ago and has been progressive.  In the same time she is also been on nitrofurantoin for times.  First 3 of these were 1 week each.  The last 1 being for at least 6 weeks ending approximately 4-6 weeks ago.  Because of progressive symptoms she was given Stiolto but this did not help.  She has been on longstanding Singulair for several years independent of these problems.  This because of allergies.  Therefore she has been referred here.  She did have a chest x-ray that on my personal visualization shows ILD changes.  It is documented below.  She did have a CT abdomen lung images that was reported as normal but I am not so sure from a year ago.  She is a previous smoker.  She is also self-referred herself to [facility] on the basis that she think she might have advanced COPD and is looking for a Zephyr valve.  Symptoms started in August 2020.   [place] Integrated Comprehensive ILD Questionnaire  Symptoms:  She does have a cough.  She says the cough started in August 2020 the same for shortness of breath started.  She does cough at night.  She does cough when she lies down and it gets worse.  She does feel a tickle in her throat.  Cough does not affect her voice she  does not bring any phlegm.  There is no wheezing.  There is no nausea no vomiting no diarrhea.    Past Medical History :   She does have a histo abdomen ry of asthma for the last few years.  Also COPD not otherwise specified for the last few years.  His acid reflux disease for the last few years.  Diabetes for the last few weeks and thyroid disease for the last few years.  Otherwise denies any collagen vascular disease or vasculitis.  Denies any sleep apnea.  Denies pulmonary hypertension.  Denies stroke denies pneumonias recurrent pneumonias.  Denies heart disease or pleurisy.   ROS: Positive for fatigue for the last several months.  Arthralgia for the last several months.  Dry eyes for the last several months.  With intermittent nausea for the last few years.  T there is daytime sleepiness for the last several months.     FAMILY HISTORY of LUNG DISEASE:  -Denies any pulmonary fibrosis.  Denies COPD denies asthma in the family.  Denies sarcoid denies cystic fibrosis denies hypersensitive pneumonitis.  Denies any autoimmune disease.   EXPOSURE HISTORY:   -She denies any Covid history.  Denies any exposure to Covid.  She did do Covid vaccine.  No reactions after  the Covid vaccine.  Did smoke cigarettes 15-year 2000 and 2015 10 to 12 cigarettes a day.  Did not do any passive smoking.  Never did any marijuana.  Elevated electronic cigarettes.  Never use cocaine never used intravenous drug use.   HOME and HOBBY DETAILS :  -[place]  Is lived there for 21 years.  Age of the home is 34 years.  In 2004 there was mold/mildew in the shower curtain.  And then there was mold under the shower.  This was torn down.  This was in 2004 but currently none.  She did some mulch back at work 2 months ago.  No humidifier use no CPAP use.  No nebulizer use.  No steam iron use.  No Jacuzzi use.  No misting Fountain in the house.  No pet birds or parakeets no gerbils.  No feather pillows.  No  mold in the [facility] duct.  No music habits.   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  No otehrr organic antigen exposure that she recollects (MR side note: a different patient in 2016 reported that in 2013-2016 time lot of renovations there and mold +).  Patient reported on September 10, 2019: That between 1966 and 1980 she worked at [employer] and [place].  At that time there is lot of mold exposure in the building.  The building was cleaned out.  Also checked for inorganic antigen exposure and this is negative.  Never worked in a dusty environment.  Never exposed to fumes.   PULMONARY TOXICITY HISTORY (27 items): Use nitrofurantoin in 2019 and 2020.  This is described below.  Question if you can for this there is      Simple office walk 185 feet x  3 laps goal with forehead probe 08/19/2019   O2 used ra  Number laps completed Attempted 3 but desaturated at 2  Comments about pace Normal pace  Resting Pulse Ox/HR 100% and 101/min  Final Pulse Ox/HR 86% and 116/min  Desaturated </= 88% yes  Desaturated <= 3% points Yes , 14  Got Tachycardic >/= 90/min yes  Symptoms at end of test dyspneic  Miscellaneous comments Corrected with  2L [place]    Nuclear medicine cardiac stress test March 2016: Overall Impression:  Normal stress nuclear study with a small, mild, fixed apical defect consistent with thinning; no ischemia. LV Wall Motion:  NL LV Function; NL Wall Motion    CT abdomen lung cuts September 2020: Reported as normal.  I personally visualized and retrospect looks hazy and whether this is atelectasis or some other findings I do not know.  Jul 19, 2019:: Personally visualized shows findings of ILD.  I am not able to see a prior chest x-ray in the system or CT scan of the chest    Results for NABRIA, NEVIN "PAM" (MRN [id number]) as of 08/19/2019 15:18  Ref. Range  03/26/2019 13:45  Creatinine Latest Ref Range: 0.40 - 1.20 mg/dL 1.05   ROS - per HPI Results for TAKINA, BUSSER "PAM" (MRN [id number]) as of 08/19/2019 15:18  Ref. Range 03/26/2019 13:45  Hemoglobin Latest Ref Range: 12.0 - 15.0 g/dL 14.0     OV 09/10/2019  Subjective:  Patient ID: Susan Hunt, female , DOB: 1945-03-18 , age 16 y.o. , MRN: [id number] , ADDRESS: [address] [place] [address]  09/10/2019 -   Chief Complaint  Patient presents with   Follow-up    shortness of breath with exertion and non-productive cough     HPI DARLINDA BELLOWS 77 y.o. -presents for follow-up to discuss results of dyspnea work-up.  High-resolution CT chest was personally visualized.  It shows presence of interstitial lung disease.  It needs Fleischner criteria for diagnosis not consistent with UIP.  Per ATS this is alternative diagnosis to UIP.  Radiologist raised the concern of hypersensitive pneumonitis but there is no air-trapping.  The other possibilities NSIP.  She is no longer on nitrofurantoin.  In any event unit she only took it intermittently.  Overall she is stable since last visit.  She had autoimmune profile.  Is only trace positive for ANA.  Her IgE is slightly elevated.  Her PFT shows mild restriction with reduction in diffusion capacity.  We went over her exposure history again.  This time she did recollect being exposed to mold for 15 to 20 years up until 1980 while living in [place] and working at [employer].  She does not recollect mold at behavioral [facility] where she worked recently until she retired.  However another patient of mine did recollect mold in this building at [employer].   Low risk cardiac stress test in 2016 but with ejection fraction 54%.-Since then no overall change in health.  Husband inquired about the low ejection fraction.  In terms of symptoms: She is try to get portable oxygen system.  She is switching company to [employer] because of the  shortage of portable oxygen systems at [facility].     OV 11/12/2019   Subjective:  Patient ID: Susan Hunt, female , DOB: 07/14/1945, age 42 y.o. years. , MRN: [id number],  ADDRESS: [place] [address] PCP  Leamon Arnt, MD Providers : Treatment Team:  Attending Provider: Brand Males, MD Thoracic surgeon: Dr. Roxan Hockey Cardiologist Dr. Margaretann Loveless  Chief Complaint  Patient presents with   Follow-up    SOB    Follow-up interstitial lung disease not otherwise specified   HPI CYNITHIA HAKIMI 77 y.o. -returns for follow-up.  Her husband is with her.  After her last visit she saw cardiothoracic surgery but we had to hold the surgical lung biopsy because her echocardiogram from September 13, 2019 showed a drop in left ventricular systolic ejection fraction.  Since then she is seen cardiology.  She had a heart catheterization that shows ejection action 40% still low.  Nonobstructive coronary artery disease.  She is followed up with cardiology and she has been started on beta-blocker.  On her father medications was also adjusted.  So far no change in dyspnea.  She tells me overall dyspnea is the same without any change.  Symptom scores shows a two-point shift with worsening but she told the medical assistant that she is feeling worse.  Walking desaturation test today shows - normalcy .  She wants portable oxygen but the first DME company [facility] did not have portable oxygen tanks.  She is found another company [employer] that is able to give her portable oxygen.  She needs a requalifying walk that is documented below.  In terms of surgical lung biopsy: She did have some prominent mediastinal nodes.  It appears Dr. Roxan Hockey has informed them that he will do a EBUS for this.  They have also been told by cardiology that from a cardiac standpoint patient would be a suitable candidate for surgical lung biopsy.  The husband wanted to know if a surgical biopsy is  indicated and also my perspective and safety.  xxxxxxxxxxxxxxxxxxxxxxxxxx Results for CAILEY, TRIGUEROS "PAM" (MRN [id number]) as of 09/10/2019 13:56  Ref. Range 08/23/2019 10:53  FVC-Pre Latest Units: L 2.61  FVC-%Pred-Pre Latest Units: % 80  FEV1-Pre Latest Units: L 2.26  FEV1-%Pred-Pre Latest Units: % 92  Results for CHACE, BISCH "PAM" (MRN [id number]) as of 09/10/2019 13:56  Ref. Range 08/23/2019 10:53  DLCO cor Latest Units: ml/min/mmHg 13.76  DLCO cor % pred Latest Units: % 64   Results for MONROE, TOURE "PAM" (MRN [id number]) as of 09/10/2019 13:56  Ref. Range 08/23/2019 10:53  TLC Latest Units: L 4.15  TLC % pred Latest Units: % 75   xxxxxxxxxxxxxxxxxxxxxx  CT chest high resolution June 2021 Lungs/Pleura: Mild pulmonary fibrosis in a pattern with apical predominance, featuring irregular peripheral interstitial opacity and scattered areas of subpleural bronchiolectasis. Mild, tubular bronchiectasis throughout. No significant air trapping on expiratory phase imaging. No pleural effusion or pneumothorax.   Upper Abdomen: No acute abnormality.   Musculoskeletal: No chest wall mass or suspicious bone lesions identified.   IMPRESSION: 1. Mild pulmonary fibrosis in a pattern with apical predominance, featuring irregular peripheral interstitial opacity and scattered areas of subpleural bronchiolectasis. Mild, tubular bronchiectasis throughout. Findings are most consistent with an "alternative diagnosis" pattern by ATS pulmonary fibrosis criteria, leading differential considerations chronic fibrotic hypersensitivity pneumonitis or NSIP. 2. Prominent mediastinal lymph nodes, nonspecific and likely reactive. 3. Coronary artery disease.  Aortic Atherosclerosis (ICD10-I70.0).     Electronically Signed   By: Eddie Candle M.D.   On: 09/04/2019 15:37    xxxxxxxxxxxxxxxxxxxxxxxxxxxxxxxxxxxxxxxxxxxxxxxxxx  Results for REATA, PETROV "PAM" (MRN [id number]) as of  09/10/2019 13:56  Ref. Range 08/19/2019 16:30 08/19/2019 16:30  SEE BELOW Unknown  Comment  Anti Nuclear Antibody (ANA) Latest Ref Range: NEGATIVE  POSITIVE (A)   ANA Pattern 1 Unknown Cytoplasmic (A)   ANA Titer 1 Unknown 1:80 (H) Negative  Angiotensin-Converting Enzyme Latest Ref Range: 9 - 67 U/L 35   Anti JO-1 Latest Ref Range: 0.0 - 0.9 AI  <0.2  CENTROMERE AB SCREEN Latest Ref Range: 0.0 - 0.9 AI  <6.2  Cyclic Citrullin Peptide Ab Latest Units: UNITS <16   dsDNA Ab Latest Ref Range: 0 - 9 IU/mL  5  ENA RNP Ab Latest Ref Range: 0.0 - 0.9 AI  0.6  ENA SSA (RO) Ab Latest Ref Range: 0.0 - 0.9 AI  <0.2  ENA SSB (LA) Ab Latest Ref Range: 0.0 - 0.9 AI  <0.2  Myeloperoxidase Abs Latest Units: AI <1.0   Serine Protease 3 Latest Units: AI <1.0   RA Latex Turbid. Latest Ref Range: <14 IU/mL <14   ENA SM Ab Ser-aCnc Latest Ref Range: 0.0 - 0.9 AI  <0.2  Chromatin Ab SerPl-aCnc Latest Ref Range: 0.0 - 0.9 AI  <0.2     has a past medical history of Asthma, Depression, Emphysema of lung ([place]), Hemorrhoids, Hyperlipidemia, Hypertension, Hypothyroidism, Obesity, Rosacea, and Tubular adenoma of colon (03/26/2019).  IMPRESSIONS   ECHO 09/13/19  1. Left ventricular ejection fraction, by estimation, is 30 to 35%. The  left ventricle has moderately decreased function. The left ventricle  demonstrates global hypokinesis. Apex poorly visualized, consider repeat  limited echo with contrast to rule out  apical thrombus. The left ventricular internal cavity size was moderately  dilated. Left ventricular diastolic parameters are consistent with Grade I  diastolic dysfunction (impaired relaxation).   2.  Right ventricular systolic function is normal. The right ventricular  size is normal. Tricuspid regurgitation signal is inadequate for assessing  PA pressure.   3. The mitral valve is normal in structure. Trivial mitral valve  regurgitation.   4. The aortic valve was not well visualized. Aortic valve  regurgitation  is not visualized. No aortic stenosis is present.  CATH 8?20/21 Prox LAD to Mid LAD lesion is 30% stenosed. Ost Cx to Prox Cx lesion is 35% stenosed. Prox RCA lesion is 35% stenosed. There is moderate left ventricular systolic dysfunction. LV end diastolic pressure is normal. The left ventricular ejection fraction is 35-45% by visual estimate.   1. Nonobstructive CAD 2. Moderate LV dysfunction. EF estimated at 40% with global hypokinesis 3. Normal LV filling pressures 4. Normal right heart pressures 5. Normal cardiac output.    Plan: medical therapy for LV dysfunction and lipid lowering therapy for nonobstructive CAD.    OV 12/27/2019  Subjective:  Patient ID: Susan Hunt, female , DOB: 03-21-1945 , age 55 y.o. , MRN: [id number] , ADDRESS: [place] [address] PCP Leamon Arnt, MD Patient Care Team: Leamon Arnt, MD as PCP - General (Family Medicine) Elroy Channel, MD as Referring Physician (Internal Medicine) Rockey Situ as Consulting Physician (Obstetrics and Gynecology) Trinda Pascal, FNP as Consulting Physician (Urology) Brand Males, MD as Consulting Physician (Pulmonary Disease)  This Provider for this visit: Treatment Team:  Attending Provider: Brand Males, MD    12/27/2019 -   Chief Complaint  Patient presents with   Follow-up    discuss biopsy results. SOB sometimes. denies cough or wheezing.     HPI REINETTE CUNEO 77 y.o. --follow-up interstitial lung disease.  Had surgical biopsy December 13, 2019.  After that she is was in the hospital requiring oxygen but then got discharged without oxygen.  At this point time she is much improved but still having soreness from her chest incision site.  Husband and she are here.  They are concerned about some postoperative atelectasis that was seen.  I visualized the x-ray.  Today walking desaturation test is normal.  I reassured them about this.  But I  will give her incentive spirometer.  The main issue is to discuss the biopsy results: The bronchoalveolar lavage shows mixed cellularity with neutrophilia.  The surgical lung biopsy itself shows UIP with just 1 carcinoid tumor.  Reviewing her clinical history this UIP pattern is now consistent with IPF.  I gave her the diagnosis.  Regarding the carcinoid tumor let: I think this is an incidental finding.  There is no  DIPNECH syndrome  Her current symptom and walk profile are stable.   Results for TYJA, GORTNEY "PAM" (MRN [id number]) as of 12/27/2019 12:06  Ref. Range 12/13/2019 08:05  Color, Fluid Latest Ref Range: YELLOW  COLORLESS (A)  Total Nucleated Cell Count, Fluid Latest Ref Range: 0 - 1,000 cu mm 18  Lymphs, Fluid Latest Units: % 25  Eos, Fluid Latest Units: % 8  Appearance, Fluid Latest Ref Range: CLEAR  HAZY (A)  Other Cells, Fluid Latest Units: % MESOTHELIAL CELLS NOTED  Neutrophil Count, Fluid Latest Ref Range: 0 - 25 % 44 (H)  Monocyte-Macrophage-Serous Fluid Latest Ref Range: 50 - 90 % 23 (L)    OV 02/04/2020   Subjective:  Patient ID: Susan Hunt, female , DOB: 11-26-1945, age 40 y.o. years. , MRN: [id number],  ADDRESS: [place] [address] PCP  Jonni Sanger,  Karie Fetch, MD Providers : Treatment Team:  Attending Provider: Brand Males, MD Patient Care Team: Leamon Arnt, MD as PCP - General (Family Medicine) Elroy Channel, MD as Referring Physician (Internal Medicine) Rockey Situ as Consulting Physician (Obstetrics and Gynecology) Trinda Pascal, FNP as Consulting Physician (Urology) Brand Males, MD as Consulting Physician (Pulmonary Disease)    Chief Complaint  Patient presents with   Follow-up    ILD, doing well   Follow-up idiopathic pulmonary fibrosis diagnosed on surgical lung biopsy 12/13/2019.  Diagnosis formally given on 12/27/2019 Commenced pirfenidone late October 2021/early November  2021     HPI TAFFY DELCONTE 77 y.o. -returns for follow-up with her husband.  This visit is mainly to focus on pirfenidone uptake.  She has been taking pirfenidone for 3 weeks or more.  She is currently on full dose 3 pills 3 times a day.  The husband wants to know when we would roll to a full largest x1 pill 3 times daily.  I indicated to them that it would be 3 to 4 months of stability with pirfenidone before we made the decision.  Currently she is tolerating pirfenidone well.  She did have some mild nausea and she thinks it is because she did not take a full meal.  Overall she is handling pirfenidone well without any problems.  No fatigue no weight loss.  Respiratory wise she is stable.  She did get in touch with a local support group.  She is asking about pulmonary rehabilitation referral and she is interested in this.  One of the main issues they wanted to discuss was the fact the pirfenidone refill in the supply chain was not consistent.  There was anxiety that they were not going to get the refill.  She had to call the office.  She believes that it is because she made the call to the office that the refill was sent.  She says she tried to call [place] without any response for a long time.  I have indicated to her that I will ask our pharmacist to review and explained the logistics.  She will have a repeat LFT today      OV 04/02/2020  Subjective:  Patient ID: Susan Hunt, female , DOB: 03-Dec-1945 , age 15 y.o. , MRN: [id number] , ADDRESS: [place] [address] PCP Leamon Arnt, MD Patient Care Team: Leamon Arnt, MD as PCP - General (Family Medicine) Elroy Channel, MD as Referring Physician (Internal Medicine) Rockey Situ as Consulting Physician (Obstetrics and Gynecology) Trinda Pascal, FNP as Consulting Physician (Urology) Brand Males, MD as Consulting Physician (Pulmonary Disease)  This Provider for this visit: Treatment Team:   Attending Provider: Brand Males, MD    04/02/2020 -   Chief Complaint  Patient presents with   Follow-up    Pt states she has been doing well since last visit and denies any complaints.   Follow-up idiopathic pulmonary fibrosis diagnosed on surgical lung biopsy 12/13/2019.  Diagnosis formally given on 12/27/2019 Commenced pirfenidone late October 2021/early November 2021  HPI MACLOVIA UHER 77 y.o. -returns for follow-up.  Presents with her husband.  She is tolerating pirfenidone fine with just very mild occasional nausea.  She wants to roll over to the 1 pill 3 times daily.  I have sent a message through secure chat to the pharmacist.  She is going to start pulmonary rehabilitation in February 2022.  We discussed lung transplantation but at this  point we will hold off.  She will check liver function test today.    There are no new issues.  She is already touch base with the support group her symptom score shows stability.  Of note she has returned to portable oxygen because she feels she does not need it.  Walking desaturation test confirms that.    She is interested in clinical trials.  She is touch base with cardiology and looking at a heart failure trial there.  She is also given her name for pulmonary fibrosis trials but currently there is a backlog.  I then emailed Berneda Rose cardiology Geophysicist/field seismologist and she replied saying, "She was in our [facility] trial which her enrollment is complete. I am not aware of any other trial we are screening her for currently but will ask the team. I think IPF trial option would be in her best interest. I also think IPF will exclude her from our HF device trials.  "   PF   OV 07/23/2020  Subjective:  Patient ID: Susan Hunt, female , DOB: [birth year] , age 58 y.o. , MRN: [id number] , ADDRESS: [place] [address] PCP Leamon Arnt, MD Patient Care Team: Leamon Arnt, MD as PCP - General (Family  Medicine) Elouise Munroe, MD as PCP - Cardiology (Cardiology) Elroy Channel, MD as Referring Physician (Internal Medicine) Rockey Situ as Consulting Physician (Obstetrics and Gynecology) Trinda Pascal, FNP as Consulting Physician (Urology) Brand Males, MD as Consulting Physician (Pulmonary Disease)  This Provider for this visit: Treatment Team:  Attending Provider: Brand Males, MD    07/23/2020 -   Chief Complaint  Patient presents with   Follow-up    IPF/ILD, denies changes   Follow-up idiopathic pulmonary fibrosis diagnosed on surgical lung biopsy 12/13/2019.  Diagnosis formally given on 12/27/2019 Commenced pirfenidone late October 2021/early November 2021. Screen failed for Pliant Part D trial due to soc coreg intake - Spring 2022. IPF final dx on MDD Jul 21, 2020  HPI SAVAHNA CASADOS 77 y.o. -returns for follow-up.  At this point in time she is tolerating pirfenidone well.  In fact no side effects.  Her symptom score is stable.  She had high-resolution CT chest as part of a research protocol and there is no progressions in September 2021.  Her walking desaturation test shows a tendency to drop.  Is unclear if it is worse or the same because she walks faster than usual.  She feels stable.  However April 2022 she had recent PFTs FVC, 2.122, 71.6%, DLCO: 11.4, 54% -> this would suggest a decline.  Of note she try to qualify for the phase 2 study CALLED  Pliant..  However she is on carvedilol for hypertension control.  Hypertensive related to the reason of her cardiomyopathy.  Therefore she is screen failed.  She has good control of blood pressure with carvedilol.  She is interested in other research trials that she might qualify for.  Her husband Netanya Yazdani is with her today.        OV 01/04/2021  Subjective:  Patient ID: Susan Hunt, female , DOB: [birth year] , age 37 y.o. , MRN: [id number] , ADDRESS: [address] [place]  [id number] PCP Leamon Arnt, MD Patient Care Team: Leamon Arnt, MD as PCP - General (Family Medicine) Elouise Munroe, MD as PCP - Cardiology (Cardiology) Elroy Channel, MD as Referring Physician (Internal Medicine) Rockey Situ as Consulting Physician (Obstetrics  and Gynecology) Trinda Pascal, FNP as Consulting Physician (Urology) Brand Males, MD as Consulting Physician (Pulmonary Disease)  This Provider for this visit: Treatment Team:  Attending Provider: Brand Males, MD    01/04/2021 -   Chief Complaint  Patient presents with   Follow-up    Pt states she had a spell 4 weeks ago where her BP dropped too low to where she passed out and fell into a dresser which caused her to break some ribs. Pt said since this happened, she has had a little increased SOB as she is not able to take a deep breath.   Follow-up idiopathic pulmonary fibrosis diagnosed on surgical lung biopsy 12/13/2019.  Diagnosis formally given on 12/27/2019 Commenced pirfenidone late October 2021/early November 2021. Screen failed for Pliant Part D trial due to soc coreg intake - Spring 2022. IPF final dx on MDD Jul 21, 2020  HPI CIENNA DUMAIS 77 y.o. -returns for follow-up.  Last seen in late spring early summer 2022.  She presents with her husband Merry Proud.  She tells me that in August 2022 she broke her right tibia after having a slip and fall on the wet floor.  She then was seen by Dr.

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
## 2022-09-10 LAB — CBC WITH DIFFERENTIAL/PLATELET
Absolute Monocytes: 780 cells/uL (ref 200–950)
Basophils Absolute: 73 cells/uL (ref 0–200)
Basophils Relative: 0.7 %
Eosinophils Absolute: 728 cells/uL — ABNORMAL HIGH (ref 15–500)
Eosinophils Relative: 7 %
HCT: 42 % (ref 35.0–45.0)
Hemoglobin: 14.1 g/dL (ref 11.7–15.5)
Lymphs Abs: 2673 cells/uL (ref 850–3900)
MCH: 31 pg (ref 27.0–33.0)
MCHC: 33.6 g/dL (ref 32.0–36.0)
MCV: 92.3 fL (ref 80.0–100.0)
MPV: 10.5 fL (ref 7.5–12.5)
Monocytes Relative: 7.5 %
Neutro Abs: 6146 cells/uL (ref 1500–7800)
Neutrophils Relative %: 59.1 %
Platelets: 305 10*3/uL (ref 140–400)
RBC: 4.55 10*6/uL (ref 3.80–5.10)
RDW: 11.9 % (ref 11.0–15.0)
Total Lymphocyte: 25.7 %
WBC: 10.4 10*3/uL (ref 3.8–10.8)

## 2022-09-10 LAB — BASIC METABOLIC PANEL
BUN: 16 mg/dL (ref 7–25)
CO2: 24 mmol/L (ref 20–32)
Calcium: 9.8 mg/dL (ref 8.6–10.4)
Chloride: 104 mmol/L (ref 98–110)
Creat: 0.99 mg/dL (ref 0.60–1.00)
Glucose, Bld: 92 mg/dL (ref 65–99)
Potassium: 4.7 mmol/L (ref 3.5–5.3)
Sodium: 139 mmol/L (ref 135–146)

## 2022-09-10 LAB — HEPATIC FUNCTION PANEL
AG Ratio: 1.5 (calc) (ref 1.0–2.5)
ALT: 13 U/L (ref 6–29)
AST: 19 U/L (ref 10–35)
Albumin: 4.4 g/dL (ref 3.6–5.1)
Alkaline phosphatase (APISO): 117 U/L (ref 37–153)
Bilirubin, Direct: 0.1 mg/dL (ref 0.0–0.2)
Globulin: 3 g/dL (calc) (ref 1.9–3.7)
Indirect Bilirubin: 0.5 mg/dL (calc) (ref 0.2–1.2)
Total Bilirubin: 0.6 mg/dL (ref 0.2–1.2)
Total Protein: 7.4 g/dL (ref 6.1–8.1)

## 2022-09-10 LAB — BRAIN NATRIURETIC PEPTIDE: Brain Natriuretic Peptide: 35 pg/mL (ref <100)

## 2022-09-12 ENCOUNTER — Telehealth: Payer: Self-pay | Admitting: Internal Medicine

## 2022-09-12 DIAGNOSIS — J84112 Idiopathic pulmonary fibrosis: Secondary | ICD-10-CM

## 2022-09-12 DIAGNOSIS — R768 Other specified abnormal immunological findings in serum: Secondary | ICD-10-CM

## 2022-09-12 DIAGNOSIS — J309 Allergic rhinitis, unspecified: Secondary | ICD-10-CM

## 2022-09-12 NOTE — Telephone Encounter (Signed)
Sudden spike in blood eosinophils. Noted blood IgE elevated in p;ast  Plan  - pls ask her if she has had skin allergy testing in past? IF so when, and by whom and results?  - if not, please have her come in and do blood RAST allergy panel     Latest Reference Range & Units 08/19/19 16:30 04/29/21 16:22  IgE (Immunoglobulin E), Serum <OR=114 kU/L 333 (H) 116 (H)  (H): Data is abnormally high    Latest Reference Range & Units 05/29/06 11:15 04/11/07 11:39 08/29/14 17:28 02/14/17 11:13 12/19/17 11:13 04/02/18 13:52 12/05/18 14:31 03/26/19 13:45 08/19/19 16:30 12/09/19 16:45 06/10/20 15:19 04/29/21 16:22 07/22/21 16:42 09/09/22 15:54  Eosinophils Absolute 15 - 500 cells/uL 0.3 0.2 0.2 0.3 0.3 0.5 0.4 0.3 0.2 299 0.5 0.4 0.5 728 (H)  (H): Data is abnormally high

## 2022-09-14 NOTE — Telephone Encounter (Signed)
Called pt and there was no answer-LMTCB °

## 2022-09-17 ENCOUNTER — Encounter: Payer: Self-pay | Admitting: Internal Medicine

## 2022-09-18 ENCOUNTER — Encounter: Payer: Self-pay | Admitting: Internal Medicine

## 2022-09-19 MED ORDER — FARXIGA 10 MG PO TABS: 10.0000 mg | ORAL_TABLET | Freq: Every day | ORAL | 3 refills | Status: DC

## 2022-09-19 NOTE — Telephone Encounter (Signed)
Parke Poisson, MD  You;     Ok to refill Marcelline Deist Carma Lair can you help with this? [place]   Prescription printed and faxed to [facility] with fax number provided.  [phone]  Confirmation of successful fax received. Pt made aware.

## 2022-09-23 ENCOUNTER — Encounter: Payer: Self-pay | Admitting: Internal Medicine

## 2022-09-26 ENCOUNTER — Telehealth: Payer: Self-pay | Admitting: Family Medicine

## 2022-09-26 NOTE — Telephone Encounter (Signed)
Susan Hunt from [facility] states case number is [id number].

## 2022-09-26 NOTE — Telephone Encounter (Signed)
[employer] calling wanting to speak about denial for CT that's up coming on Wed. And when we call them expedite appeals department

## 2022-09-26 NOTE — Telephone Encounter (Signed)
Spoke with Leta Jungling and he stated that he does not see anything from Dr Mardelle Matte for an appeal and that it may have been misrouted to the wrong office.  Ref# [id number]

## 2022-09-26 NOTE — Telephone Encounter (Signed)
Kandis Nab with [facility] calling regarding an appeal. Asking for a call back at [phone]

## 2022-09-27 NOTE — Telephone Encounter (Signed)
Susan Hunt can you look at this please?

## 2022-09-28 ENCOUNTER — Ambulatory Visit (HOSPITAL_BASED_OUTPATIENT_CLINIC_OR_DEPARTMENT_OTHER): Payer: Medicare Other

## 2022-09-30 ENCOUNTER — Telehealth: Payer: Self-pay | Admitting: Internal Medicine

## 2022-09-30 NOTE — Telephone Encounter (Signed)
Paper Work Dropped Off: Envelope   Date:09/30/2022  Location of paper:  Dr. Lupe Carney mailbox

## 2022-09-30 NOTE — Telephone Encounter (Signed)
PT's Husband presented to the front desk with an envelope for Dr. Elvera Lennox. I will put it in his box. He dcln  the offer to make a copy. States the contents is a "Prior Auth Rejection letter that they wanted him to look at."

## 2022-10-04 ENCOUNTER — Ambulatory Visit (HOSPITAL_COMMUNITY): Payer: Medicare Other

## 2022-10-04 ENCOUNTER — Telehealth: Payer: Medicare Other | Admitting: Internal Medicine

## 2022-10-06 NOTE — Telephone Encounter (Signed)
Yes - one part of the echo has been denied & I am working on this one,  I called & spoke to the patient, I let pt know she is good to proceed with the CT - it did not require a prior auth,.

## 2022-10-06 NOTE — Telephone Encounter (Signed)
I spoke with Misty Stanley who schedules the echos & discussed.  Per Misty Stanley, ok to proceed with the echo & she documented that [phone] (3D) part is not authorized.  Pt is scheduled on 10/17/22 & 1:55.  Notified patient.  Nothing further needed at this time.

## 2022-10-06 NOTE — Telephone Encounter (Signed)
Has this been handled yet at all?

## 2022-10-07 DIAGNOSIS — M21372 Foot drop, left foot: Secondary | ICD-10-CM | POA: Diagnosis not present

## 2022-10-07 NOTE — Telephone Encounter (Signed)
Spoke with the pt and notified of response per Dr. Marchelle Gearing  She verbalized understanding  She states that she will come to have labs done at [facility]  Never had allergy eval/skin testing  Nothing further needed

## 2022-10-12 ENCOUNTER — Other Ambulatory Visit: Payer: Medicare Other

## 2022-10-12 ENCOUNTER — Ambulatory Visit (HOSPITAL_BASED_OUTPATIENT_CLINIC_OR_DEPARTMENT_OTHER)
Admission: RE | Admit: 2022-10-12 | Discharge: 2022-10-12 | Disposition: A | Discharge status: Home / Self Care | Payer: Medicare Other | Source: Ambulatory Visit | Attending: Internal Medicine | Admitting: Internal Medicine

## 2022-10-12 DIAGNOSIS — J84112 Idiopathic pulmonary fibrosis: Secondary | ICD-10-CM | POA: Insufficient documentation

## 2022-10-12 DIAGNOSIS — J309 Allergic rhinitis, unspecified: Secondary | ICD-10-CM

## 2022-10-12 DIAGNOSIS — R768 Other specified abnormal immunological findings in serum: Secondary | ICD-10-CM | POA: Diagnosis not present

## 2022-10-12 DIAGNOSIS — R918 Other nonspecific abnormal finding of lung field: Secondary | ICD-10-CM | POA: Diagnosis not present

## 2022-10-14 ENCOUNTER — Encounter: Payer: Self-pay | Admitting: Internal Medicine

## 2022-10-15 ENCOUNTER — Encounter: Payer: Self-pay | Admitting: Internal Medicine

## 2022-10-15 NOTE — Telephone Encounter (Signed)
   Hi MS  Susan Hunt   So the main thing is there is enough dust mite allergy for you that could be contributing to your cough and can explain the increase in blood eosinophils as well  Plan  - first like you to control dust mite at home   - look it up in google and there are lot of information around washing pillows sheets weekly with hot water and appopriate pillow casings. Also use high end HEPA filter   - we can look at biologi injection for asthma (cough variant ) or inhaled steroid as next step    - my chart message sent to patient  - will not be calling her with results   Latest Reference Range & Units 10/12/22 14:07  Interpretation  Pend  Sheep Sorrel IgE kU/L <0.10  Pecan/Hickory Tree IgE kU/L <0.10  IgE (Immunoglobulin E), Serum <OR=114 kU/L 86  Allergen, D pternoyssinus,d7 kU/L 1.18 (H)  Cat Dander kU/L <0.10  Dog Dander kU/L <0.10  [place] Grass kU/L <0.10  [place] Grass kU/L <0.10  Timothy Grass kU/L <0.10  Cockroach kU/L <0.10  Aspergillus fumigatus, m3 kU/L <0.10  Allergen, Comm Silver Charletta Cousin, t9 kU/L <0.10  Allergen, Cottonwood, t14 kU/L <0.10  Elm IgE kU/L <0.10  Allergen, Mulberry, t76 kU/L <0.10  Allergen, Oak,t7 kU/L <0.10  COMMON RAGWEED (SHORT) (W1) IGE kU/L <0.10  Allergen, Mouse Urine Protein, e78 kU/L <0.10  D. farinae kU/L 4.64 (H)  Allergen, Cedar tree, t12 kU/L <0.10  Box Elder IgE kU/L 0.45 (H)  Rough Pigweed  IgE kU/L <0.10  Allergen, A. alternata, m6 kU/L <0.10  Allergen, P. notatum, m1 kU/L <0.10  CLADOSPORIUM HERBARUM (M2) IGE kU/L <0.10  Class  2 0 0 3 0 0 0 0 0 0  0 1 0 0 0 0 0 0 0 0  0 0 0 0  (H): Data is abnormally high

## 2022-10-16 ENCOUNTER — Encounter: Payer: Self-pay | Admitting: Internal Medicine

## 2022-10-17 ENCOUNTER — Ambulatory Visit (HOSPITAL_COMMUNITY)
Admission: RE | Admit: 2022-10-17 | Discharge: 2022-10-17 | Disposition: A | Discharge status: Home / Self Care | Payer: Medicare Other | Source: Ambulatory Visit | Attending: Internal Medicine | Admitting: Internal Medicine

## 2022-10-17 DIAGNOSIS — E119 Type 2 diabetes mellitus without complications: Secondary | ICD-10-CM | POA: Insufficient documentation

## 2022-10-17 DIAGNOSIS — E785 Hyperlipidemia, unspecified: Secondary | ICD-10-CM | POA: Insufficient documentation

## 2022-10-17 DIAGNOSIS — I10 Essential (primary) hypertension: Secondary | ICD-10-CM | POA: Insufficient documentation

## 2022-10-17 DIAGNOSIS — R0609 Other forms of dyspnea: Secondary | ICD-10-CM

## 2022-10-17 DIAGNOSIS — G4730 Sleep apnea, unspecified: Secondary | ICD-10-CM | POA: Insufficient documentation

## 2022-10-17 DIAGNOSIS — I071 Rheumatic tricuspid insufficiency: Secondary | ICD-10-CM | POA: Diagnosis not present

## 2022-10-17 LAB — ECHOCARDIOGRAM COMPLETE
Area-P 1/2: 1.86 cm2
Calc EF: 42.1 %
S' Lateral: 4.4 cm
Single Plane A2C EF: 38 %
Single Plane A4C EF: 46.5 %

## 2022-10-17 NOTE — Progress Notes (Signed)
Ef 35-40% . I Think similar to before. Please follow with cardiology. We will not be calling you with these results.

## 2022-10-17 NOTE — Progress Notes (Signed)
Echocardiogram 2D Echocardiogram has been performed.  Susan Hunt 10/17/2022, 2:39 PM

## 2022-10-26 ENCOUNTER — Other Ambulatory Visit: Payer: Self-pay | Admitting: Family Medicine

## 2022-10-27 NOTE — Progress Notes (Signed)
Pulmonary fIbrosis stable x 2 years on CT chest. Good news xxxx

## 2022-11-04 ENCOUNTER — Ambulatory Visit: Payer: Medicare Other | Admitting: Student

## 2022-11-04 ENCOUNTER — Encounter: Payer: Self-pay | Admitting: Student

## 2022-11-04 VITALS — BP 98/60 | HR 64 | Ht 68.0 in | Wt 197.0 lb

## 2022-11-04 DIAGNOSIS — G4733 Obstructive sleep apnea (adult) (pediatric): Secondary | ICD-10-CM | POA: Diagnosis not present

## 2022-11-04 DIAGNOSIS — I5022 Chronic systolic (congestive) heart failure: Secondary | ICD-10-CM | POA: Diagnosis not present

## 2022-11-04 DIAGNOSIS — J84112 Idiopathic pulmonary fibrosis: Secondary | ICD-10-CM

## 2022-11-04 NOTE — Progress Notes (Signed)
  Cardiology Office Note:   Date:  11/04/2022  ID:  ZAKERA FIDDLER, DOB 1945/12/27, MRN [id number]  History of Present Illness:   Susan Hunt is a 77 y.o. female with DM, HTN, HLD, hypothyroid, mild OSA not on CPAP, ILD, and HFrEF s/p barostim 12/2021 seen today for routine electrophysiology followup.   Seen 05/19/2022 and device adjusted up at pt request.  Shortly after she had stim described as "buzzing" in her neck with intermittent jaw radiation. Programmed abck to 9.8 ma @ 65 mus for chronic programming  Since last being seen in our clinic the patient reports doing about the same. She remains SOB with moderate exertion. Can go for groceries and walk her dog up to 1/2 mile without much difficulty. EF slightly improved on recent Echo. [place] with known ILD/Fibrosis, follows with Dr. Marchelle Gearing.  She still does not always take her O2 with her when out and about. Would like to visit family in [place].  Review of systems complete and found to be negative unless listed in HPI.    EP Information / Studies Reviewed:    EKG is not ordered today. EKG from 07/08/2022 reviewed which showed NSR at 70  [place] [employer]- Performed personally and reviewed in detail today,  See scanned report  Device History: Barostim (standard) implanted 01/2022 for Chronic systolic CHF  Echo 10/17/2022 LVEF 35-40%, grade 1 DD, mild/mod TR (Slightly improved from 30-35% 05/2021)  Physical Exam:   VS:  BP 98/60   Pulse 64   Ht 5\' 8"  (1.727 m)   Wt 197 lb (89.4 kg)   BMI 29.95 kg/m    Wt Readings from Last 3 Encounters:  11/04/22 197 lb (89.4 kg)  09/09/22 197 lb 3.2 oz (89.4 kg)  07/08/22 196 lb (88.9 kg)     GEN: Well nourished, well developed in no acute distress NECK: No JVD; No carotid bruits CARDIAC: Regular rate and rhythm, no murmurs, rubs, gallops RESPIRATORY:  Clear to auscultation without rales, wheezing or rhonchi  ABDOMEN: Soft, non-tender, non-distended EXTREMITIES:  No edema; No  deformity   ASSESSMENT AND PLAN:    1. Chronic systolic CHF s/p Barostim implantation NYHA II-III symptoms.   Device titrated from 9.8 millamp to 11.0 milliamp by increments of 0.4 with good blood pressure response and no stimulation.  Device impedence stable. Pt goals are to continue to increase her functional status.   Normal device function See scanned report.  As before, I believe her symptoms are primarily driven by her ILD and she needs to use O2 with exertion as directed.  Her BNP has been chronically low.   HTN Stable on current regimen   ILD Continue O2 as directed  OSA  Has not needed CPAP Encouraged O2 use as directed   Disposition:   Follow up with EP APP in in 6 months  Signed, Graciella Freer, PA-C

## 2022-11-04 NOTE — Patient Instructions (Signed)
Medication Instructions:  Your physician recommends that you continue on your current medications as directed. Please refer to the Current Medication list given to you today.  *If you need a refill on your cardiac medications before your next appointment, please call your pharmacy*  Lab Work: None ordered If you have labs (blood work) drawn today and your tests are completely normal, you will receive your results only by: MyChart Message (if you have MyChart) OR A paper copy in the mail If you have any lab test that is abnormal or we need to change your treatment, we will call you to review the results  Follow-Up: At [facility], you and your health needs are our priority.  As part of our continuing mission to provide you with exceptional heart care, we have created designated Provider Care Teams.  These Care Teams include your primary Cardiologist (physician) and Advanced Practice Providers (APPs -  Physician Assistants and Nurse Practitioners) who all work together to provide you with the care you need, when you need it.  Your next appointment:   6 month(s)  Provider:   Michael "Andy" Tillery, PA-C  

## 2022-11-07 ENCOUNTER — Ambulatory Visit: Payer: Medicare Other | Attending: Internal Medicine | Admitting: Internal Medicine

## 2022-11-07 ENCOUNTER — Encounter: Payer: Self-pay | Admitting: Internal Medicine

## 2022-11-07 VITALS — BP 112/72 | HR 73 | Ht 68.0 in | Wt 196.8 lb

## 2022-11-07 DIAGNOSIS — I10 Essential (primary) hypertension: Secondary | ICD-10-CM

## 2022-11-07 DIAGNOSIS — Z006 Encounter for examination for normal comparison and control in clinical research program: Secondary | ICD-10-CM

## 2022-11-07 DIAGNOSIS — J84112 Idiopathic pulmonary fibrosis: Secondary | ICD-10-CM

## 2022-11-07 DIAGNOSIS — I428 Other cardiomyopathies: Secondary | ICD-10-CM

## 2022-11-07 DIAGNOSIS — R0609 Other forms of dyspnea: Secondary | ICD-10-CM

## 2022-11-07 DIAGNOSIS — E782 Mixed hyperlipidemia: Secondary | ICD-10-CM | POA: Diagnosis not present

## 2022-11-07 DIAGNOSIS — G4733 Obstructive sleep apnea (adult) (pediatric): Secondary | ICD-10-CM

## 2022-11-07 DIAGNOSIS — I5022 Chronic systolic (congestive) heart failure: Secondary | ICD-10-CM

## 2022-11-07 DIAGNOSIS — I5020 Unspecified systolic (congestive) heart failure: Secondary | ICD-10-CM

## 2022-11-07 DIAGNOSIS — Z79899 Other long term (current) drug therapy: Secondary | ICD-10-CM

## 2022-11-07 MED ORDER — AMLODIPINE BESYLATE 5 MG PO TABS
5.0000 mg | ORAL_TABLET | Freq: Every day | ORAL | 3 refills | Status: DC
Start: 2022-11-07 — End: 2023-12-05

## 2022-11-07 NOTE — Progress Notes (Signed)
Cardiology Office Note:  .   Date:  11/07/2022  ID:  Susan Hunt, DOB 1945-07-30, MRN [id number] PCP: Willow Ora, MD   HeartCare Providers Cardiologist:  Parke Poisson, MD Electrophysiologist:  Sherryl Manges, MD    History of Present Illness: .   Susan Hunt is a 77 y.o. female with nonischemic cardiomyopathy, HTN, HLD, ILD with pulmonary fibrosis. NICM felt related to HTN. LVEF 41% on CMR. Barostim implant 12/2021 with improvement in symptoms.  Doing well overall with no significant change in symptoms, remains NYHA class I-II but does continue to require oxygen.  Stable follow-up from Barostim a few days ago with EP.  Reviewed echocardiogram which shows stable findings as compared to cardiac MRI with regard to ejection fraction.  ROS: No chest pain, stable shortness of breath.  No palpitations.  No syncope.  No lower extremity edema.      Studies Reviewed: .        Echo 10/17/22 - reviewed, grossly unchanged from MRI. Cardiac Studies & Procedures   CARDIAC CATHETERIZATION  CARDIAC CATHETERIZATION 11/01/2019  Narrative  Prox LAD to Mid LAD lesion is 30% stenosed.  Ost Cx to Prox Cx lesion is 35% stenosed.  Prox RCA lesion is 35% stenosed.  There is moderate left ventricular systolic dysfunction.  LV end diastolic pressure is normal.  The left ventricular ejection fraction is 35-45% by visual estimate.  1. Nonobstructive CAD 2. Moderate LV dysfunction. EF estimated at 40% with global hypokinesis 3. Normal LV filling pressures 4. Normal right heart pressures 5. Normal cardiac output.  Plan: medical therapy for LV dysfunction and lipid lowering therapy for nonobstructive CAD.  Findings Coronary Findings Diagnostic  Dominance: Right  Left Main Vessel was injected. Vessel is normal in caliber. Vessel is angiographically normal.  Left Anterior Descending Prox LAD to Mid LAD lesion is 30% stenosed. The lesion is moderately  calcified.  Ramus Intermedius Vessel was injected. Vessel is normal in caliber. Vessel is angiographically normal.  Left Circumflex Ost Cx to Prox Cx lesion is 35% stenosed. The lesion is moderately calcified.  Right Coronary Artery Prox RCA lesion is 35% stenosed. The lesion is moderately calcified.  Intervention  No interventions have been documented.     ECHOCARDIOGRAM  ECHOCARDIOGRAM COMPLETE 10/17/2022  Narrative ECHOCARDIOGRAM REPORT    Patient Name:   Susan Hunt Date of Exam: 10/17/2022 Medical Rec #:  [id number]          Height:       68.0 in Accession #:    [id number]         Weight:       197.2 lb Date of Birth:  [birth year]           BSA:          2.032 m Patient Age:    14 years           BP:           130/70 mmHg Patient Gender: F                  HR:           67 bpm. Exam Location:  Outpatient  Procedure: 2D Echo, Color Doppler and Cardiac Doppler  Indications:    Dyspnea R06.00  History:        Patient has prior history of Echocardiogram examinations, most recent 05/26/2021. CHF; Risk Factors:Diabetes, Dyslipidemia, Hypertension and Sleep Apnea.  Sonographer:    Eulah Pont RDCS  Referring Phys: [id number] MURALI RAMASWAMY  IMPRESSIONS   1. Left ventricular ejection fraction, by estimation, is 35 to 40%. The left ventricle has moderately decreased function. The left ventricle demonstrates global hypokinesis. The left ventricular internal cavity size was mildly dilated. Left ventricular diastolic parameters are consistent with Grade I diastolic dysfunction (impaired relaxation). 2. Right ventricular systolic function is normal. The right ventricular size is normal. There is normal pulmonary artery systolic pressure. The estimated right ventricular systolic pressure is 28.2 mmHg. 3. The mitral valve is normal in structure. No evidence of mitral valve regurgitation. No evidence of mitral stenosis. 4. Tricuspid valve regurgitation is mild to moderate. 5.  The aortic valve is normal in structure. Aortic valve regurgitation is not visualized. No aortic stenosis is present. 6. The inferior vena cava is normal in size with greater than 50% respiratory variability, suggesting right atrial pressure of 3 mmHg.  Comparison(s): Prior images reviewed side by side. The left ventricular function has improved.  FINDINGS Left Ventricle: Left ventricular ejection fraction, by estimation, is 35 to 40%. The left ventricle has moderately decreased function. The left ventricle demonstrates global hypokinesis. The left ventricular internal cavity size was mildly dilated. There is no left ventricular hypertrophy. Left ventricular diastolic parameters are consistent with Grade I diastolic dysfunction (impaired relaxation).  Right Ventricle: The right ventricular size is normal. No increase in right ventricular wall thickness. Right ventricular systolic function is normal. There is normal pulmonary artery systolic pressure. The tricuspid regurgitant velocity is 2.51 m/s, and with an assumed right atrial pressure of 3 mmHg, the estimated right ventricular systolic pressure is 28.2 mmHg.  Left Atrium: Left atrial size was normal in size.  Right Atrium: Right atrial size was normal in size.  Pericardium: There is no evidence of pericardial effusion.  Mitral Valve: The mitral valve is normal in structure. No evidence of mitral valve regurgitation. No evidence of mitral valve stenosis.  Tricuspid Valve: The tricuspid valve is normal in structure. Tricuspid valve regurgitation is mild to moderate. No evidence of tricuspid stenosis.  Aortic Valve: The aortic valve is normal in structure. Aortic valve regurgitation is not visualized. No aortic stenosis is present.  Pulmonic Valve: The pulmonic valve was normal in structure. Pulmonic valve regurgitation is not visualized. No evidence of pulmonic stenosis.  Aorta: The aortic root is normal in size and structure.  Venous:  The inferior vena cava is normal in size with greater than 50% respiratory variability, suggesting right atrial pressure of 3 mmHg.  IAS/Shunts: No atrial level shunt detected by color flow Doppler.   LEFT VENTRICLE PLAX 2D LVIDd:         5.70 cm      Diastology LVIDs:         4.40 cm      LV e' medial:    4.81 cm/s LV PW:         0.80 cm      LV E/e' medial:  8.8 LV IVS:        0.80 cm      LV e' lateral:   6.73 cm/s LVOT diam:     2.10 cm      LV E/e' lateral: 6.3 LV SV:         45 LV SV Index:   22 LVOT Area:     3.46 cm  LV Volumes (MOD) LV vol d, MOD A2C: 108.0 ml LV vol d, MOD A4C: 104.0 ml LV vol s, MOD A2C: 67.0 ml LV vol s,  MOD A4C: 55.6 ml LV SV MOD A2C:     41.0 ml LV SV MOD A4C:     104.0 ml LV SV MOD BP:      44.6 ml  RIGHT VENTRICLE RV S prime:     17.00 cm/s TAPSE (M-mode): 1.7 cm  LEFT ATRIUM             Index        RIGHT ATRIUM           Index LA diam:        3.90 cm 1.92 cm/m   RA Area:     15.00 cm LA Vol (A2C):   46.5 ml 22.89 ml/m  RA Volume:   36.10 ml  17.77 ml/m LA Vol (A4C):   60.9 ml 29.98 ml/m LA Biplane Vol: 55.3 ml 27.22 ml/m AORTIC VALVE LVOT Vmax:   59.10 cm/s LVOT Vmean:  41.100 cm/s LVOT VTI:    0.130 m  AORTA Ao Root diam: 3.50 cm Ao Asc diam:  3.40 cm  MITRAL VALVE               TRICUSPID VALVE MV Area (PHT): 1.86 cm    TR Peak grad:   25.2 mmHg MV Decel Time: 408 msec    TR Vmax:        251.00 cm/s MV E velocity: 42.30 cm/s MV A velocity: 77.60 cm/s  SHUNTS MV E/A ratio:  0.55        Systemic VTI:  0.13 m Systemic Diam: 2.10 cm  Donato Schultz MD Electronically signed by Donato Schultz MD Signature Date/Time: 10/17/2022/4:40:00 PM    Final      CARDIAC MRI  MR CARDIAC MORPHOLOGY W WO CONTRAST 06/08/2020  Narrative CLINICAL DATA:  Cardiomyopathy, follow up  EXAM: CARDIAC MRI  TECHNIQUE: The patient was scanned on a 1.5 Tesla GE magnet. A dedicated cardiac coil was used. Functional imaging was done using  Fiesta sequences. 2,3, and 4 chamber views were done to assess for RWMA's. Modified Simpson's rule using a short axis stack was used to calculate an ejection fraction on a dedicated work [employer]. The patient received 8mL GADAVIST GADOBUTROL 1 MMOL/ML IV SOLN. After 10 minutes inversion recovery sequences were used to assess for infiltration and scar tissue.  FINDINGS: LEFT VENTRICLE:  Mildly dilated left ventricular chamber size by indexed volume.  Normal left ventricular wall thickness.  Mild-moderately reduced left ventricular systolic function.  LVEF = 41%  Moderate global hypokinesis.  Prominent trabeculations that do not meet criteria for noncompaction.  No definite myocardial edema, T2 time 47 msec  Normal first pass perfusion.  There is post contrast delayed myocardial enhancement. Midmyocardial stripe of delayed enhancement at the base and mid ventricle. This is a nonspecific finding and can be seen in dilated cardiomyopathy. Inferior RV insertion side LGE at mid ventricular level. This can suggest increased pulmonary pressure.  Normal T1 myocardial nulling kinetics suggest against a diagnosis of cardiac amyloidosis.  ECV = 25%, normal range  RIGHT VENTRICLE:  Normal right ventricular chamber size.  Normal right ventricular wall thickness.  Mildly reduced right ventricular systolic function.  RVEF = 42%  Borderline global hypokinesis.  No post contrast delayed myocardial enhancement.  ATRIA:  Mild left atrial dilation.  Normal right atrial size.  VALVES:  No significant valvular abnormalities.  Tricuspid aortic valve with normal leaflet excursion.  Trivial MR by visual estimate.  PERICARDIUM:  Normal pericardium.  No pericardial effusion.  OTHER: No  significant extracardiac findings.  MEASUREMENTS: Left ventricle:  LV Female  LV EF: 41% (Normal 56-78%)  Absolute volumes:  LV EDV: (Normal 52-141  mL)  LV ESV: (Normal 13-51 mL)  LV SV: 74mL (Normal 33-97 mL)  CO: 4.3 L/min (Normal 2.7-6.0 L/min)  Indexed volumes:  LV EDV: 54mL/sq-m (normal 41-81 mL/sq-m)  LV ESV: 108mL/sq-m (Normal 12-21 mL/sq-m)  LV SV: 38mL/sq-m (Normal 26-56 mL/sq-m)  CI: 2.07L/min/sq-m (normal 1.8-3.8 L/min/sq-m)  Right ventricle:  RV female  RV EF: 42% (normal 47-80%)  Absolute volumes:  RV EDV: 144 mL (Normal 58-154 mL)  RV ESV: 84 mL (Normal 12-68 mL)  RV SV: 60 mL (Normal 35-98 mL)  CO: 3.5 L/min (Normal 2.7-6 L/min)  Indexed volumes:  RV EDV: 70 ML/sq-m (Normal 48-87 mL/sq-m)  RV ESV: 41 mL/sq-m (Normal 11-28 mL/sq-m)  RV SV: 29 mL/sq-m (Normal 27-57 mL/sq-m)  CI: 1.68 L/min/sq-m (Normal 1.8-3.8 L/min/sq-m)  IMPRESSION: 1. Mild-moderately reduced left ventricular systolic function with mild left ventricular enlargement. LVEF 41%. Moderate global hypokinesis.  2. Mildly reduced right ventricular systolic function with normal right ventricular size, RVEF 42%.  3. Midmyocardial stripe of delayed enhancement at base and mid ventricle which is a nonspecific finding and can be seen in dilated cardiomyopathy. No definite findings to suggest scar, fibrosis, inflammation, infiltrative process or infarction.  4. Mild inferior RV insertion site delayed myocardial enhancement may suggest increased pulmonary pressure.   Electronically Signed By: Weston Brass On: 06/08/2020 14:06         Risk Assessment/Calculations:             Physical Exam:   VS:  BP 112/72   Pulse 73   Ht 5\' 8"  (1.727 m)   Wt 196 lb 12.8 oz (89.3 kg)   SpO2 92%   BMI 29.92 kg/m    Wt Readings from Last 3 Encounters:  11/07/22 196 lb 12.8 oz (89.3 kg)  11/04/22 197 lb (89.4 kg)  09/09/22 197 lb 3.2 oz (89.4 kg)    GEN: Well nourished, well developed in no acute distress NECK: No JVD; No carotid bruits CARDIAC: RRR, no murmurs, rubs, gallops RESPIRATORY: Bibasilar Velcro  crackles ABDOMEN: Soft, non-tender, non-distended EXTREMITIES:  No edema; No deformity   ASSESSMENT AND PLAN: .    1. Chronic systolic heart failure (HCC)   2. Dyspnea on exertion   3. Essential hypertension   4. HFrEF (heart failure with reduced ejection fraction) (HCC) [I50.20]   5. Nonischemic cardiomyopathy (HCC)   6. IPF (idiopathic pulmonary fibrosis) (HCC)   7. OSA (obstructive sleep apnea)   8. Research study patient   9. Mixed hyperlipidemia   10. Medication management    Chronic combined systolic and diastolic heart failure Nonischemic cardiomyopathy (HCC) Essential hypertension Barostim implant -She has undergone implant of Barostim device with significant improvement in symptoms.  She has gone from NYHA class III symptoms to NYHA class I-II symptoms, improvement. -EF is approximately 41% on MRI.  Recent echo similar findings. - Followed closely by electrophysiology Otilio Saber PA for Barostim. -Heart Failure Therapy  ACE-I/ARB/ARNI: Valsartan 320 mg daily BB: carvedilol 25 mg BID MRA:  spironolactone 25mg  daily, decreased due to renal function. SGLT2I: farxiga 10 mg daily Loop diuretic: lasix 40 mg prn Also on amlodipine 5 mg daily for HTN. BP normal today.  Refill amlodipine today.   CAD, nonobstructive - cont asa and statin Mixed hyperlipidemia - well treated with crestor 10 mg daily.  Needs follow-up lipids, last available are 2022  with LDL of 81, if LDL greater than 70 on next check, consider intensifying statin therapy. -Patient interested in pursuing [facility] for further management of metabolic issues, does carry diagnosis of diabetes.  Will make the referral.        Dispo:  Patient Instructions  Medication Instructions:  Your physician recommends that you continue on your current medications as directed. Please refer to the Current Medication list given to you today.  *If you need a refill on your cardiac medications before your next appointment, please  call your pharmacy*    Follow-Up: At [facility], you and your health needs are our priority.  As part of our continuing mission to provide you with exceptional heart care, we have created designated Provider Care Teams.  These Care Teams include your primary Cardiologist (physician) and Advanced Practice Providers (APPs -  Physician Assistants and Nurse Practitioners) who all work together to provide you with the care you need, when you need it.  We recommend signing up for the patient portal called "MyChart".  Sign up information is provided on this After Visit Summary.  MyChart is used to connect with patients for Virtual Visits (Telemedicine).  Patients are able to view lab/test results, encounter notes, upcoming appointments, etc.  Non-urgent messages can be sent to your provider as well.   To learn more about what you can do with MyChart, go to [email or web address].    Your next appointment:   12 month(s)  Provider:   Parke Poisson, MD     Other Instructions We will schedule you an appointment to discuss GLP-1 medication with Clinical Pharmacist.     Signed, Parke Poisson, MD

## 2022-11-07 NOTE — Patient Instructions (Addendum)
Medication Instructions:  Your physician recommends that you continue on your current medications as directed. Please refer to the Current Medication list given to you today.  *If you need a refill on your cardiac medications before your next appointment, please call your pharmacy*    Follow-Up: At [facility], you and your health needs are our priority.  As part of our continuing mission to provide you with exceptional heart care, we have created designated Provider Care Teams.  These Care Teams include your primary Cardiologist (physician) and Advanced Practice Providers (APPs -  Physician Assistants and Nurse Practitioners) who all work together to provide you with the care you need, when you need it.  We recommend signing up for the patient portal called "MyChart".  Sign up information is provided on this After Visit Summary.  MyChart is used to connect with patients for Virtual Visits (Telemedicine).  Patients are able to view lab/test results, encounter notes, upcoming appointments, etc.  Non-urgent messages can be sent to your provider as well.   To learn more about what you can do with MyChart, go to [email or web address].    Your next appointment:   12 month(s)  Provider:   Parke Poisson, MD     Other Instructions We will schedule you an appointment to discuss GLP-1 medication with Clinical Pharmacist.

## 2022-11-09 DIAGNOSIS — D485 Neoplasm of uncertain behavior of skin: Secondary | ICD-10-CM | POA: Diagnosis not present

## 2022-11-09 DIAGNOSIS — C44729 Squamous cell carcinoma of skin of left lower limb, including hip: Secondary | ICD-10-CM | POA: Diagnosis not present

## 2022-11-16 DIAGNOSIS — R300 Dysuria: Secondary | ICD-10-CM | POA: Diagnosis not present

## 2022-11-16 DIAGNOSIS — N3001 Acute cystitis with hematuria: Secondary | ICD-10-CM | POA: Diagnosis not present

## 2022-11-22 ENCOUNTER — Telehealth: Payer: Self-pay | Admitting: Pharmacist

## 2022-11-22 ENCOUNTER — Ambulatory Visit: Payer: Medicare Other | Attending: Cardiology | Admitting: Pharmacist

## 2022-11-22 ENCOUNTER — Encounter: Payer: Self-pay | Admitting: Pharmacist

## 2022-11-22 VITALS — Wt 197.2 lb

## 2022-11-22 DIAGNOSIS — E119 Type 2 diabetes mellitus without complications: Secondary | ICD-10-CM | POA: Diagnosis not present

## 2022-11-22 DIAGNOSIS — I5020 Unspecified systolic (congestive) heart failure: Secondary | ICD-10-CM

## 2022-11-22 NOTE — Telephone Encounter (Signed)
Please complete PA for Wegovy.

## 2022-11-22 NOTE — Patient Instructions (Addendum)
It was nice meeting you today  The medications we discussed today are called Wegovy and Ozempic (both are semaglutide)  I will complete the prior authorizations for you and contact you when approved  Both medications are given once weekly. Try your best to be as physically active as you can  Please let me know if you have any questions or concerns  Laural Golden, PharmD, BCACP, CDCES, CPP [address], [address] [place], [place], [address] Phone: [phone], Fax: [phone]

## 2022-11-22 NOTE — Progress Notes (Unsigned)
Patient ID: Susan Hunt                 DOB: Feb 16, 1946                    MRN: [id number]     HPI: Susan Hunt is a 77 y.o. female patient referred to pharmacy clinic by Dr Jacques Navy to initiate GLP1-RA therapy. PMH is significant for CHF, T2DM, HTN, interstitial lung disease, and HLD, and obesity. Most recent BMI 29.99.  Patient presents today to discuss GLP1 therapy. Retired Engineer, civil (consulting). Husband is retired Teacher, early years/pre. Very concerned regarding her breathing and CHF symptoms. She is SOB on short walks and has difficulty bending over and standing back up without losing her breath. Is not sure if due to underlying cardiac or pulmonary issues. Would like to be able to walk her dog further distances.  Has diagnosis of T2DM. Has not been checking BG.    Labs: Lab Results  Component Value Date   HGBA1C 6.3 (H) 12/22/2021    Wt Readings from Last 1 Encounters:  11/07/22 196 lb 12.8 oz (89.3 kg)    BP Readings from Last 1 Encounters:  11/07/22 112/72   Pulse Readings from Last 1 Encounters:  11/07/22 73       Component Value Date/Time   CHOL 159 12/11/2020 1208   TRIG 163 (H) 12/11/2020 1208   HDL 52 12/11/2020 1208   CHOLHDL 3.1 12/11/2020 1208   VLDL 28.4 07/19/2019 1333   LDLCALC 81 12/11/2020 1208   LDLDIRECT 146.7 05/29/2006 1115    Past Medical History:  Diagnosis Date   Anemia    Asthma    "I dont think I have it.   CHF (congestive heart failure) (HCC)    Depression    Diabetes mellitus without complication (HCC)    type 2   Emphysema of lung (HCC)    GERD (gastroesophageal reflux disease)    Hemorrhoids    History of kidney stones    Cystoscopy   Hyperlipidemia    Hypertension    Hypothyroidism    Obesity    Pulmonary fibrosis (HCC)    Rosacea    Sleep apnea    mild, no need for CPAP   Tubular adenoma of colon 03/26/2019   Colonoscopy repeat in 3 years    Current Outpatient Medications on File Prior to Visit  Medication Sig Dispense Refill    amLODipine (NORVASC) 5 MG tablet Take 1 tablet (5 mg total) by mouth daily. 90 tablet 3   carvedilol (COREG) 25 MG tablet Take 1 tablet (25 mg total) by mouth 2 (two) times daily. 180 tablet 3   cetirizine (ZYRTEC) 10 MG tablet Take 10 mg by mouth daily.     FARXIGA 10 MG TABS tablet Take 1 tablet (10 mg total) by mouth daily. 90 tablet 3   levothyroxine (SYNTHROID) 125 MCG tablet TAKE 1 TABLET BY MOUTH DAILY 90 tablet 1   montelukast (SINGULAIR) 10 MG tablet TAKE ONE TABLET BY MOUTH EVERY NIGHT AT BEDTIME 90 tablet 1   Multiple Vitamin (MULTIVITAMIN WITH MINERALS) TABS tablet Take 1 tablet by mouth daily.     omeprazole (PRILOSEC) 20 MG capsule TAKE ONE CAPSULE BY MOUTH DAILY ON AN EMPTY STOMACH 90 capsule 3   ondansetron (ZOFRAN-ODT) 4 MG disintegrating tablet DISSOLVE ONE TABLET BY MOUTH EVERY 8 HOURS AS NEEDED FOR NAUSEA/ VOMITING 20 tablet 0   Pirfenidone (ESBRIET) 801 MG TABS Take 801 mg by mouth with breakfast, with  lunch, and with evening meal. 90 tablet 11   rosuvastatin (CRESTOR) 10 MG tablet TAKE ONE TABLET BY MOUTH DAILY 90 tablet 3   spironolactone (ALDACTONE) 25 MG tablet TAKE 1 TABLET BY MOUTH DAILY 90 tablet 3   valsartan (DIOVAN) 320 MG tablet Take 1 tablet (320 mg total) by mouth daily. 90 tablet 3   venlafaxine XR (EFFEXOR-XR) 150 MG 24 hr capsule TAKE ONE CAPSULE BY MOUTH DAILY 90 capsule 3   No current facility-administered medications on file prior to visit.    Allergies  Allergen Reactions   Ace Inhibitors Cough   Clarithromycin     REACTION: GI sx   Codeine Nausea Only   Sulfamethoxazole Nausea Only   Telmisartan Other (See Comments)    sleepy     Assessment/Plan:  1. T2DM/Weight loss/CHF - Patient reports weight gain since CHF diagnosis and would be a candidate for semaglutide. Confirmed patient has no personal or family history of medullary thyroid carcinoma (MTC) or Multiple Endocrine Neoplasia syndrome type 2 (MEN 2). Injection technique reviewed at today's  visit.  Advised patient on common side effects including nausea, diarrhea, dyspepsia, decreased appetite, and fatigue. Counseled patient on reducing meal size and how to titrate medication to minimize side effects. Counseled patient to call if intolerable side effects or if experiencing dehydration, abdominal pain, or dizziness. Patient will adhere to dietary modifications and will target at least 150 minutes of moderate intensity exercise weekly.   Will complete PA for [facility] and if not covered, then PA for Ozempic. Demonstrated use of pens in room. Patient voiced understanding.  Laural Golden, PharmD, BCACP, CDCES, CPP [address] [place], [place], [address] Phone: [phone], Fax: [phone]

## 2022-11-23 ENCOUNTER — Telehealth: Payer: Self-pay | Admitting: Pharmacy Technician

## 2022-11-23 ENCOUNTER — Other Ambulatory Visit (HOSPITAL_COMMUNITY): Payer: Self-pay

## 2022-11-23 NOTE — Telephone Encounter (Signed)
Pharmacy Patient Advocate Encounter   Received notification from Pt Calls Messages that prior authorization for wegovy is required/requested.   Insurance verification completed.   The patient is insured through [facility] .   Per test claim: PA required; PA submitted to [facility] via CoverMyMeds Key/confirmation #/EOC [id number] Status is pending

## 2022-11-23 NOTE — Telephone Encounter (Signed)
PA request has been Submitted. New Encounter created for follow up. For additional info see Pharmacy Prior Auth telephone encounter from 11/23/22.

## 2022-11-24 ENCOUNTER — Telehealth: Payer: Self-pay | Admitting: Internal Medicine

## 2022-11-24 ENCOUNTER — Encounter: Payer: Self-pay | Admitting: Internal Medicine

## 2022-11-24 NOTE — Telephone Encounter (Signed)
Patient states having symptoms of headache, runny nose, cough and sore throat. Tested positive for Covid 19. [facility] [address] Patient phone number is [phone].

## 2022-11-25 ENCOUNTER — Other Ambulatory Visit (HOSPITAL_COMMUNITY): Payer: Self-pay

## 2022-11-25 DIAGNOSIS — U071 COVID-19: Secondary | ICD-10-CM | POA: Diagnosis not present

## 2022-11-25 NOTE — Telephone Encounter (Signed)
Called pt but unable to reach. Left message for pt to return call.

## 2022-11-28 ENCOUNTER — Other Ambulatory Visit (HOSPITAL_COMMUNITY): Payer: Self-pay

## 2022-11-28 ENCOUNTER — Encounter: Payer: Self-pay | Admitting: *Deleted

## 2022-11-28 NOTE — Telephone Encounter (Signed)
See other encounter.

## 2022-11-28 NOTE — Telephone Encounter (Signed)
ATC x2.  Left voicemail asking her to return call.  Also sent mychart message to call the office so we can make sure she got what she needed.  Will await return call.

## 2022-11-29 ENCOUNTER — Other Ambulatory Visit (HOSPITAL_COMMUNITY): Payer: Self-pay

## 2022-11-29 NOTE — Telephone Encounter (Signed)
Called patient.  Patient states she went to an urgent care and received rx for Paxlovid.  Patient is taking rx medications.  Patient will call the clinic not improving.  Patient verbalized understanding.

## 2022-11-30 ENCOUNTER — Other Ambulatory Visit (HOSPITAL_COMMUNITY): Payer: Self-pay

## 2022-12-02 DIAGNOSIS — C44729 Squamous cell carcinoma of skin of left lower limb, including hip: Secondary | ICD-10-CM | POA: Diagnosis not present

## 2022-12-02 DIAGNOSIS — Z48817 Encounter for surgical aftercare following surgery on the skin and subcutaneous tissue: Secondary | ICD-10-CM | POA: Diagnosis not present

## 2022-12-05 ENCOUNTER — Other Ambulatory Visit (HOSPITAL_COMMUNITY): Payer: Self-pay

## 2022-12-05 NOTE — Telephone Encounter (Signed)
Pharmacy Patient Advocate Encounter  Received notification from [facility] that Prior Authorization for wegovy has been APPROVED from 12/05/22 to .Marland Kitchen Ran test claim, Copay is $100.00. This test claim was processed through [facility]- copay amounts may vary at other pharmacies due to pharmacy/plan contracts, or as the patient moves through the different stages of their insurance plan.   PA #/Case ID/Reference #: waiting for approval fax

## 2022-12-06 ENCOUNTER — Other Ambulatory Visit (HOSPITAL_COMMUNITY): Payer: Self-pay

## 2022-12-07 ENCOUNTER — Encounter: Payer: Self-pay | Admitting: Internal Medicine

## 2022-12-07 DIAGNOSIS — I779 Disorder of arteries and arterioles, unspecified: Secondary | ICD-10-CM

## 2022-12-07 DIAGNOSIS — E669 Obesity, unspecified: Secondary | ICD-10-CM

## 2022-12-07 MED ORDER — WEGOVY 0.25 MG/0.5ML ~~LOC~~ SOAJ
0.2500 mg | SUBCUTANEOUS | 0 refills | Status: DC
Start: 2022-12-07 — End: 2022-12-27

## 2022-12-12 DIAGNOSIS — C44729 Squamous cell carcinoma of skin of left lower limb, including hip: Secondary | ICD-10-CM | POA: Diagnosis not present

## 2022-12-19 DIAGNOSIS — Z48817 Encounter for surgical aftercare following surgery on the skin and subcutaneous tissue: Secondary | ICD-10-CM | POA: Diagnosis not present

## 2022-12-23 ENCOUNTER — Encounter: Payer: Self-pay | Admitting: *Deleted

## 2022-12-23 NOTE — Progress Notes (Deleted)
This patient is due for the following labs: A1C Urine albumin/creatinine ratio Please consider scheduling an appointment

## 2022-12-23 NOTE — Progress Notes (Signed)
This patient is due for the following labs: A1C Urine albumin/creatinine ratio Please consider scheduling an appointment

## 2022-12-26 DIAGNOSIS — Z48817 Encounter for surgical aftercare following surgery on the skin and subcutaneous tissue: Secondary | ICD-10-CM | POA: Diagnosis not present

## 2022-12-27 MED ORDER — WEGOVY 0.5 MG/0.5ML ~~LOC~~ SOAJ
0.5000 mg | SUBCUTANEOUS | 0 refills | Status: DC
Start: 2022-12-27 — End: 2023-01-06

## 2022-12-27 NOTE — Addendum Note (Signed)
Addended by: Cheree Ditto on: 12/27/2022 07:53 AM   Modules accepted: Orders

## 2023-01-04 ENCOUNTER — Telehealth: Payer: Self-pay | Admitting: Pharmacy Technician

## 2023-01-04 DIAGNOSIS — J849 Interstitial pulmonary disease, unspecified: Secondary | ICD-10-CM

## 2023-01-04 NOTE — Telephone Encounter (Signed)
Received [employer] re-enrollment form for 2025, Spoke to patient and received updated income and household size, filled our provider form and will place in mailbox for MD signature.

## 2023-01-05 ENCOUNTER — Other Ambulatory Visit: Payer: Self-pay | Admitting: Internal Medicine

## 2023-01-05 ENCOUNTER — Other Ambulatory Visit: Payer: Self-pay | Admitting: Family Medicine

## 2023-01-06 ENCOUNTER — Other Ambulatory Visit (HOSPITAL_COMMUNITY): Payer: Self-pay

## 2023-01-06 ENCOUNTER — Other Ambulatory Visit: Payer: Self-pay | Admitting: Family Medicine

## 2023-01-06 MED ORDER — WEGOVY 0.5 MG/0.5ML ~~LOC~~ SOAJ
0.5000 mg | SUBCUTANEOUS | 0 refills | Status: DC
  Filled 2023-01-06: qty 2, 28d supply, fill #0

## 2023-01-06 NOTE — Addendum Note (Signed)
Addended by: Cheree Ditto on: 01/06/2023 10:20 AM   Modules accepted: Orders

## 2023-01-09 ENCOUNTER — Other Ambulatory Visit (HOSPITAL_COMMUNITY): Payer: Self-pay

## 2023-01-10 ENCOUNTER — Ambulatory Visit: Payer: Medicare Other | Admitting: Family Medicine

## 2023-01-10 ENCOUNTER — Encounter: Payer: Self-pay | Admitting: Family Medicine

## 2023-01-10 ENCOUNTER — Other Ambulatory Visit (HOSPITAL_COMMUNITY): Payer: Self-pay

## 2023-01-10 VITALS — BP 110/58 | HR 92 | Temp 98.1°F | Ht 68.0 in | Wt 191.4 lb

## 2023-01-10 DIAGNOSIS — F339 Major depressive disorder, recurrent, unspecified: Secondary | ICD-10-CM

## 2023-01-10 DIAGNOSIS — K760 Fatty (change of) liver, not elsewhere classified: Secondary | ICD-10-CM

## 2023-01-10 DIAGNOSIS — Z0000 Encounter for general adult medical examination without abnormal findings: Secondary | ICD-10-CM

## 2023-01-10 DIAGNOSIS — J849 Interstitial pulmonary disease, unspecified: Secondary | ICD-10-CM

## 2023-01-10 DIAGNOSIS — Z1231 Encounter for screening mammogram for malignant neoplasm of breast: Secondary | ICD-10-CM

## 2023-01-10 DIAGNOSIS — M85859 Other specified disorders of bone density and structure, unspecified thigh: Secondary | ICD-10-CM

## 2023-01-10 DIAGNOSIS — E782 Mixed hyperlipidemia: Secondary | ICD-10-CM

## 2023-01-10 DIAGNOSIS — I5020 Unspecified systolic (congestive) heart failure: Secondary | ICD-10-CM

## 2023-01-10 DIAGNOSIS — D126 Benign neoplasm of colon, unspecified: Secondary | ICD-10-CM

## 2023-01-10 DIAGNOSIS — E119 Type 2 diabetes mellitus without complications: Secondary | ICD-10-CM | POA: Diagnosis not present

## 2023-01-10 DIAGNOSIS — E039 Hypothyroidism, unspecified: Secondary | ICD-10-CM | POA: Diagnosis not present

## 2023-01-10 DIAGNOSIS — Z0001 Encounter for general adult medical examination with abnormal findings: Secondary | ICD-10-CM

## 2023-01-10 DIAGNOSIS — J431 Panlobular emphysema: Secondary | ICD-10-CM | POA: Diagnosis not present

## 2023-01-10 LAB — CBC WITH DIFFERENTIAL/PLATELET
Basophils Absolute: 0 10*3/uL (ref 0.0–0.1)
Basophils Relative: 0.3 % (ref 0.0–3.0)
Eosinophils Absolute: 0.5 10*3/uL (ref 0.0–0.7)
Eosinophils Relative: 4.4 % (ref 0.0–5.0)
HCT: 45.8 % (ref 36.0–46.0)
Hemoglobin: 14.4 g/dL (ref 12.0–15.0)
Lymphocytes Relative: 16.6 % (ref 12.0–46.0)
Lymphs Abs: 1.8 10*3/uL (ref 0.7–4.0)
MCHC: 31.4 g/dL (ref 30.0–36.0)
MCV: 96.1 fL (ref 78.0–100.0)
Monocytes Absolute: 0.8 10*3/uL (ref 0.1–1.0)
Monocytes Relative: 7.1 % (ref 3.0–12.0)
Neutro Abs: 7.8 10*3/uL — ABNORMAL HIGH (ref 1.4–7.7)
Neutrophils Relative %: 71.6 % (ref 43.0–77.0)
Platelets: 277 10*3/uL (ref 150.0–400.0)
RBC: 4.77 Mil/uL (ref 3.87–5.11)
RDW: 13.1 % (ref 11.5–15.5)
WBC: 10.9 10*3/uL — ABNORMAL HIGH (ref 4.0–10.5)

## 2023-01-10 LAB — LIPID PANEL
Cholesterol: 143 mg/dL (ref 0–200)
HDL: 52.8 mg/dL (ref >39.00)
LDL Cholesterol: 61 mg/dL (ref 0–99)
NonHDL: 90.22
Total CHOL/HDL Ratio: 3
Triglycerides: 146 mg/dL (ref 0.0–149.0)
VLDL: 29.2 mg/dL (ref 0.0–40.0)

## 2023-01-10 LAB — MICROALBUMIN / CREATININE URINE RATIO
Creatinine,U: 164.6 mg/dL
Microalb Creat Ratio: 0.8 mg/g (ref 0.0–30.0)
Microalb, Ur: 1.4 mg/dL (ref 0.0–1.9)

## 2023-01-10 LAB — COMPREHENSIVE METABOLIC PANEL
ALT: 13 U/L (ref 0–35)
AST: 19 U/L (ref 0–37)
Albumin: 4.2 g/dL (ref 3.5–5.2)
Alkaline Phosphatase: 89 U/L (ref 39–117)
BUN: 14 mg/dL (ref 6–23)
CO2: 26 meq/L (ref 19–32)
Calcium: 9.6 mg/dL (ref 8.4–10.5)
Chloride: 106 meq/L (ref 96–112)
Creatinine, Ser: 1.01 mg/dL (ref 0.40–1.20)
GFR: 53.42 mL/min — ABNORMAL LOW (ref >60.00)
Glucose, Bld: 118 mg/dL — ABNORMAL HIGH (ref 70–99)
Potassium: 4.2 meq/L (ref 3.5–5.1)
Sodium: 138 meq/L (ref 135–145)
Total Bilirubin: 0.5 mg/dL (ref 0.2–1.2)
Total Protein: 7.2 g/dL (ref 6.0–8.3)

## 2023-01-10 LAB — HEMOGLOBIN A1C: Hgb A1c MFr Bld: 6.3 % (ref 4.6–6.5)

## 2023-01-10 LAB — TSH: TSH: 0.22 u[IU]/mL — ABNORMAL LOW (ref 0.35–5.50)

## 2023-01-10 MED ORDER — VENLAFAXINE HCL ER 150 MG PO CP24: 150.0000 mg | ORAL_CAPSULE | Freq: Every day | ORAL | 3 refills | Status: DC

## 2023-01-10 NOTE — Progress Notes (Signed)
Subjective  Chief Complaint  Patient presents with   Annual Exam    Pt here for annual exam and is currently fasting. Mammogram has not been scheudled yet   Diabetes   Hypertension    HPI: Susan Hunt is a 77 y.o. female who presents to [facility] [place] today for a Female Wellness Visit. She also has the concerns and/or needs as listed above in the chief complaint. These will be addressed in addition to the Health Maintenance Visit.   Wellness Visit: annual visit with health maintenance review and exam  Health maintenance: Last here 18 months ago.  Generally she was doing much better.  Pulmonology cardiology and electrophysiology regularly.  I reviewed notes.  Uses oxygen at night and for exertion.  On multiple heart failure medications.  No new complaints.  She is overdue for mammogram.  Colonoscopy will be due next year.  Bone density is current.  She is happy, been traveling a lot. Chronic disease f/u and/or acute problem visit: (deemed necessary to be done in addition to the wellness visit): Hypothyroidism, hyperlipidemia, diet-controlled diabetes: Reports feels all are well-controlled.  Takes her medications regularly.  Tries eat a good diet.  No symptoms of hyperglycemia.  Had recent normal eye exam.  No neuropathy or complications from diabetes.  Tolerates her statin. Mood is well-controlled, she continues on Effexor.  She does need refill. Heart failure and ILD: See above.  Assessment  1. Encounter for well adult exam with abnormal findings   2. Acquired hypothyroidism   3. Mixed hyperlipidemia   4. Panlobular emphysema (HCC)   5. Tubular adenoma of colon   6. Controlled type 2 diabetes mellitus without complication, without long-term current use of insulin (HCC)   7. ILD (interstitial lung disease) (HCC)   8. HFrEF (heart failure with reduced ejection fraction) (HCC)   9. Osteopenia of neck of femur, unspecified laterality   10. Hepatic steatosis    11. Screening mammogram for breast cancer   12. Major depression, recurrent, chronic [facility])      Plan  Female Wellness Visit: Age appropriate Health Maintenance and Prevention measures were discussed with patient. Included topics are cancer screening recommendations, ways to keep healthy (see AVS) including dietary and exercise recommendations, regular eye and dental care, use of seat belts, and avoidance of moderate alcohol use and tobacco use.  Patient has mammogram order and will schedule. BMI: discussed patient's BMI and encouraged positive lifestyle modifications to help get to or maintain a target BMI. HM needs and immunizations were addressed and ordered. See below for orders. See HM and immunization section for updates.  Immunizations are current Routine labs and screening tests ordered including cmp, cbc and lipids where appropriate. Discussed recommendations regarding Vit D and calcium supplementation (see AVS)  Chronic disease management visit and/or acute problem visit: Will recheck TSH, lipids, LFTs, A1c and urine testing.  Clinically euthyroid.  Continue Crestor, Multiple heart medications reviewed.  Reviewed her labs.  Stable. Interstitial lung disease: Progressive.  Now on oxygen.  Fortunately she is able to walk further without.  Quality of life has improved. Depression: Refilled Effexor XR 150 mg daily.  Follow up: To recheck Orders Placed This Encounter  Procedures   MM DIGITAL SCREENING BILATERAL   CBC with Differential/Platelet   Comprehensive metabolic panel   Lipid panel   Hemoglobin A1c   TSH   Microalbumin / creatinine urine ratio   Meds ordered this encounter  Medications   venlafaxine XR (  EFFEXOR-XR) 150 MG 24 hr capsule    Sig: Take 1 capsule (150 mg total) by mouth daily.    Dispense:  90 capsule    Refill:  3      Body mass index is 29.1 kg/m. Wt Readings from Last 3 Encounters:  01/10/23 191 lb 6.4 oz (86.8 kg)  11/22/22 197 lb 3.2 oz (89.4  kg)  11/07/22 196 lb 12.8 oz (89.3 kg)     Patient Active Problem List   Diagnosis Date Noted Date Diagnosed   ILD (interstitial lung disease) (HCC) 11/01/2019     Priority: High   HFrEF (heart failure with reduced ejection fraction) (HCC) 11/01/2019     Priority: High   Controlled type 2 diabetes mellitus without complication, without long-term current use of insulin (HCC) 07/19/2019     Priority: High   Tubular adenoma of colon 03/26/2019     Priority: High    Colonoscopy repeat in 3 years    Panlobular emphysema (HCC) 06/17/2014     Priority: High    Overview:  Long-term former smoker, chest CT March 2016 shows early fibrotic changes and apices and emphysematous changes.  Inhalers started    Major depression, recurrent, chronic (HCC) 01/28/2014     Priority: High   Obesity (BMI 30.0-34.9) 07/15/2013     Priority: High   Acquired hypothyroidism 04/11/2007     Priority: High   Mixed hyperlipidemia 04/11/2007     Priority: High   Essential hypertension 04/11/2007     Priority: High   Recurrent UTI 04/12/2019     Priority: Medium     Daily prophylaxis started with [facility] January 2021    Hepatic steatosis 03/26/2019     Priority: Medium    Osteopenia of femoral neck 05/24/2016     Priority: Medium     Dexa 11/2021 T = - 1.9, stable recheck 3-5 years.  Dexa 05/2019 T = - 1.7 femur. Stable. Recheck 2-3 years. Dexa 05/2016 T = -1.4; stable. No significant decrease. Recheck in 2-3 years.    RENAL CALCULUS, HX OF 04/11/2007     Priority: Medium    ACE-inhibitor cough 10/02/2017     Priority: Low   Postmenopausal symptoms 02/14/2017     Priority: Low    Uses clonidine prn at night to help her sleep IF needed for hot flushes or poor sleep. Started by GYN years ago. occasional use.    Grade II hemorrhoids 05/03/2016     Priority: Low   Mild obstructive sleep apnea 05/06/2014     Priority: Low    Overview:  Negative sleep study 2013 Mild OA 05/2021    Allergic rhinitis  12/18/2009     Priority: Low    Overview:  10/1 IMO update    Rosacea 04/11/2007     Priority: Low    Qualifier: Diagnosis of  By: Lawernce Ion, CMA (AAMA), Bethann Berkshire     URINARY INCONTINENCE 04/11/2007     Priority: Low    Qualifier: Diagnosis of  By: Fabian Sharp MD, Neta Mends     Research study patient 06/24/2020    Chronic vertigo 07/19/2019    Health Maintenance  Topic Date Due   Medicare Annual Wellness (AWV)  Never done   FOOT EXAM  12/11/2021   Diabetic kidney evaluation - Urine ACR  06/15/2022   HEMOGLOBIN A1C  06/23/2022   MAMMOGRAM  12/08/2022   COVID-19 Vaccine (5 - 2023-24 season) 01/26/2023 (Originally 11/13/2022)   OPHTHALMOLOGY EXAM  05/12/2023   Diabetic kidney  evaluation - eGFR measurement  09/09/2023   Colonoscopy  11/19/2023   DEXA SCAN  12/07/2024   Pneumonia Vaccine 28+ Years old  Completed   INFLUENZA VACCINE  Completed   Hepatitis C Screening  Completed   Zoster Vaccines- Shingrix  Completed   HPV VACCINES  Aged Out   DTaP/Tdap/Td  Discontinued   Immunization History  Administered Date(s) Administered   Fluad Quad(high Dose 65+) 12/09/2019, 12/11/2020   Fluad Trivalent(High Dose 65+) 12/20/2022   Hepatitis A, Adult 04/06/2018   Influenza Split 01/06/2009   Influenza, High Dose Seasonal PF 12/17/2014, 12/20/2015, 01/15/2017, 12/18/2017, 11/28/2018   Influenza, Seasonal, Injecte, Preservative Fre 01/07/2014, 12/17/2014   Influenza,inj,Quad PF,6+ Mos 01/15/2017   Influenza,trivalent, recombinat, inj, PF 12/03/2012   Influenza-Unspecified 01/15/2017   Moderna Covid-19 Vaccine Bivalent Booster 23yrs & up 03/18/2021   Moderna SARS-COV2 Booster Vaccination 01/07/2020   Moderna Sars-Covid-2 Vaccination 04/17/2019, 05/15/2019, 08/20/2020   Pneumococcal Conjugate-13 01/07/2014   Pneumococcal Polysaccharide-23 11/23/2011   Td 03/14/2006, 10/16/2011   Zoster Recombinant(Shingrix) 10/21/2017, 12/18/2017   Zoster, Live 12/06/2013   We updated and reviewed the  patient's past history in detail and it is documented below. Allergies: Patient is allergic to ace inhibitors, clarithromycin, codeine, sulfamethoxazole, and telmisartan. Past Medical History Patient  has a past medical history of Anemia, Asthma, CHF (congestive heart failure) (HCC), Depression, Diabetes mellitus without complication (HCC), Emphysema of lung (HCC), GERD (gastroesophageal reflux disease), Hemorrhoids, History of kidney stones, Hyperlipidemia, Hypertension, Hypothyroidism, Obesity, Pulmonary fibrosis (HCC), Rosacea, Sleep apnea, and Tubular adenoma of colon (03/26/2019). Past Surgical History Patient  has a past surgical history that includes Nasal sinus surgery; Dental surgery (2016); Cataract extraction w/ intraocular lens implant (Bilateral, 2016); Fracture surgery (Left, 2013); Vaginectomy, partial; Anterior and posterior vaginal repair (2020); medtronix nerve stimulator (07/18/2019); Colonoscopy; Cystoscopy; RIGHT/LEFT HEART CATH AND CORONARY ANGIOGRAPHY (N/A, 11/01/2019); Abdominal hysterectomy; Cardiac catheterization (11/01/2019); Lung biopsy (Right, 12/13/2019); Lymph node biopsy (Right, 12/13/2019); Video bronchoscopy (N/A, 12/13/2019); and Intercostal nerve block (Right, 12/13/2019). Family History: Patient family history includes Alcohol abuse in her father; Diabetes in her maternal grandmother; Drug abuse in her son; Heart disease in her mother; Hypertension in her mother; Osteoporosis in her mother; Stroke in her mother. Social History:  Patient  reports that she quit smoking about 9 years ago. Her smoking use included cigarettes. She started smoking about 24 years ago. She has a 7.5 pack-year smoking history. She has never used smokeless tobacco. She reports that she does not currently use alcohol. She reports that she does not use drugs.  Review of Systems: Constitutional: negative for fever or malaise Ophthalmic: negative for photophobia, double vision or loss of  vision Cardiovascular: negative for chest pain, dyspnea on exertion, or new LE swelling Respiratory: negative for SOB or persistent cough Gastrointestinal: negative for abdominal pain, change in bowel habits or melena Genitourinary: negative for dysuria or gross hematuria, no abnormal uterine bleeding or disharge Musculoskeletal: negative for new gait disturbance or muscular weakness Integumentary: negative for new or persistent rashes, no breast lumps Neurological: negative for TIA or stroke symptoms Psychiatric: negative for SI or delusions Allergic/Immunologic: negative for hives  Patient Care Team    Relationship Specialty Notifications Start End  Willow Ora, MD PCP - General Family Medicine  10/17/22   Parke Poisson, MD PCP - Cardiology Cardiology  06/18/20   Duke Salvia, MD PCP - Electrophysiology Cardiology  01/18/22   Darolyn Rua, MD Referring Physician Internal Medicine  10/02/17   Karleen Dolphin Consulting  Physician Obstetrics and Gynecology  03/26/19   Donnita Falls, FNP Consulting Physician Urology  03/26/19   Kalman Shan, MD Consulting Physician Pulmonary Disease  08/20/19     Objective  Vitals: BP (!) 110/58   Pulse 92   Temp 98.1 F (36.7 C)   Ht 5\' 8"  (1.727 m)   Wt 191 lb 6.4 oz (86.8 kg)   SpO2 95%   BMI 29.10 kg/m  General:  Well developed, well nourished, no acute distress no respiratory distress Psych:  Alert and orientedx3,normal mood and affect HEENT:  Normocephalic, atraumatic, non-icteric sclera,  supple neck without adenopathy, mass or thyromegaly Cardiovascular:  Normal S1, S2, RRR  Respiratory: Dry rales bilaterally gastrointestinal: normal bowel sounds, soft, non-tender, no noted masses. No HSM MSK: extremities without edema, joints without erythema or swelling Neurologic:    Mental status is normal.  Gross motor and sensory exams are normal.  No tremor  Commons side effects, risks, benefits, and alternatives for medications  and treatment plan prescribed today were discussed, and the patient expressed understanding of the given instructions. Patient is instructed to call or message via MyChart if he/she has any questions or concerns regarding our treatment plan. No barriers to understanding were identified. We discussed Red Flag symptoms and signs in detail. Patient expressed understanding regarding what to do in case of urgent or emergency type symptoms.  Medication list was reconciled, printed and provided to the patient in AVS. Patient instructions and summary information was reviewed with the patient as documented in the AVS. This note was prepared with assistance of Dragon voice recognition software. Occasional wrong-word or sound-a-like substitutions may have occurred due to the inherent limitations of voice recognition software

## 2023-01-13 MED ORDER — LEVOTHYROXINE SODIUM 112 MCG PO TABS: 112.0000 ug | ORAL_TABLET | Freq: Every day | ORAL | 3 refills | Status: DC

## 2023-01-13 NOTE — Addendum Note (Signed)
Addended by: Asencion Partridge on: 01/13/2023 09:37 AM   Modules accepted: Orders

## 2023-01-13 NOTE — Progress Notes (Signed)
Please call patient: labs overall are stable. Cholesterol, kidney and liver tests look fine. However, thyroid level shows that we need to lower the dose slightly. I have ordered levothyroxine ; please stop the dose.  Please schedule lab visit in 8 weeks to recheck TSH. I have placed order Sugar test remains near the diabetic range. I recommend monitoring this level and limiting sweets. Please schedule a f/u visit with me in 6 months for recheck.

## 2023-01-18 ENCOUNTER — Encounter: Payer: Self-pay | Admitting: Internal Medicine

## 2023-01-20 ENCOUNTER — Other Ambulatory Visit (HOSPITAL_COMMUNITY): Payer: Self-pay

## 2023-01-20 NOTE — Telephone Encounter (Signed)
Submitted Patient Assistance Application to [employer] for [employer] along with provider portion, patient portion, PA, medication list, insurance card copy and income documents. Will update patient when we receive a response.  Phone #: [phone] Fax #: [phone]

## 2023-01-23 NOTE — Telephone Encounter (Signed)
See Telephone Enc 01/20/23

## 2023-01-29 ENCOUNTER — Other Ambulatory Visit: Payer: Self-pay | Admitting: Family Medicine

## 2023-01-31 ENCOUNTER — Other Ambulatory Visit: Payer: Self-pay

## 2023-01-31 ENCOUNTER — Other Ambulatory Visit: Payer: Self-pay | Admitting: Internal Medicine

## 2023-01-31 MED ORDER — CARVEDILOL 25 MG PO TABS: 25.0000 mg | ORAL_TABLET | Freq: Two times a day (BID) | ORAL | 2 refills | Status: DC

## 2023-02-02 ENCOUNTER — Encounter: Payer: Self-pay | Admitting: Pharmacist

## 2023-02-02 ENCOUNTER — Other Ambulatory Visit: Payer: Self-pay

## 2023-02-02 DIAGNOSIS — I779 Disorder of arteries and arterioles, unspecified: Secondary | ICD-10-CM

## 2023-02-02 DIAGNOSIS — E66811 Obesity, class 1: Secondary | ICD-10-CM

## 2023-02-02 MED ORDER — SPIRONOLACTONE 25 MG PO TABS: 25.0000 mg | ORAL_TABLET | Freq: Every day | ORAL | 3 refills | Status: DC

## 2023-02-03 ENCOUNTER — Other Ambulatory Visit (HOSPITAL_COMMUNITY): Payer: Self-pay

## 2023-02-03 ENCOUNTER — Other Ambulatory Visit: Payer: Self-pay

## 2023-02-03 MED ORDER — WEGOVY 1 MG/0.5ML ~~LOC~~ SOAJ
1.0000 mg | SUBCUTANEOUS | 0 refills | Status: DC
  Filled 2023-02-03 – 2023-02-17 (×2): qty 2, 28d supply, fill #0

## 2023-02-08 ENCOUNTER — Ambulatory Visit
Admission: RE | Admit: 2023-02-08 | Discharge: 2023-02-08 | Disposition: A | Discharge status: Home / Self Care | Payer: Medicare Other | Source: Ambulatory Visit | Attending: Family Medicine | Admitting: Family Medicine

## 2023-02-08 DIAGNOSIS — Z1231 Encounter for screening mammogram for malignant neoplasm of breast: Secondary | ICD-10-CM | POA: Diagnosis not present

## 2023-02-14 ENCOUNTER — Other Ambulatory Visit (HOSPITAL_COMMUNITY): Payer: Self-pay

## 2023-02-16 ENCOUNTER — Encounter: Payer: Self-pay | Admitting: Internal Medicine

## 2023-02-16 ENCOUNTER — Ambulatory Visit: Payer: Medicare Other | Admitting: Internal Medicine

## 2023-02-16 VITALS — BP 148/79 | HR 73 | Ht 67.0 in | Wt 192.2 lb

## 2023-02-16 DIAGNOSIS — J84112 Idiopathic pulmonary fibrosis: Secondary | ICD-10-CM

## 2023-02-16 DIAGNOSIS — Z5181 Encounter for therapeutic drug level monitoring: Secondary | ICD-10-CM

## 2023-02-16 NOTE — Progress Notes (Signed)
OV 08/19/2019  Subjective:  Patient ID: Susan Hunt, female , DOB: [birth year] , age 77 y.o. , MRN: [id number] , ADDRESS: [address] [place] [address]   08/19/2019 -   Chief Complaint  Patient presents with   Consult    COPD, emphysema.  sob with exertion.  dry coughs alot at night     HPI Susan Hunt 77 y.o. -she is a retired Engineer, civil (consulting).  She used to work at the [employer] across [place] and having retired from that 10 years ago and then she worked for another agency apparently having retired from all duties few years ago.  Husband is a retired Teacher, early years/pre.  They are here for shortness of breath.  She tells me that she has had insidious onset of shortness of breath and cough that started 1 year ago and has been progressive.  In the same time she is also been on nitrofurantoin for times.  First 3 of these were 1 week each.  The last 1 being for at least 6 weeks ending approximately 4-6 weeks ago.  Because of progressive symptoms she was given Stiolto but this did not help.  She has been on longstanding Singulair for several years independent of these problems.  This because of allergies.  Therefore she has been referred here.  She did have a chest x-ray that on my personal visualization shows ILD changes.  It is documented below.  She did have a CT abdomen lung images that was reported as normal but I am not so sure from a year ago.  She is a previous smoker.  She is also self-referred herself to [facility] on the basis that she think she might have advanced COPD and is looking for a Zephyr valve.  Symptoms started in August 2020.   Susan Hunt  Symptoms:  She does have a cough.  She says the cough started in August 2020 the same for shortness of breath started.  She does cough at night.  She does cough when she lies down and it gets worse.  She does feel a tickle in her throat.  Cough does not affect her voice she  does not bring any phlegm.  There is no wheezing.  There is no nausea no vomiting no diarrhea.    Past Medical History :   She does have a histo abdomen ry of asthma for the last few years.  Also COPD not otherwise specified for the last few years.  His acid reflux disease for the last few years.  Diabetes for the last few weeks and thyroid disease for the last few years.  Otherwise denies any collagen vascular disease or vasculitis.  Denies any sleep apnea.  Denies pulmonary hypertension.  Denies stroke denies pneumonias recurrent pneumonias.  Denies heart disease or pleurisy.   ROS: Positive for fatigue for the last several months.  Arthralgia for the last several months.  Dry eyes for the last several months.  With intermittent nausea for the last few years.  T there is daytime sleepiness for the last several months.     FAMILY HISTORY of LUNG DISEASE:  -Denies any pulmonary fibrosis.  Denies COPD denies asthma in the family.  Denies sarcoid denies cystic fibrosis denies hypersensitive pneumonitis.  Denies any autoimmune disease.   EXPOSURE HISTORY:   -She denies any Covid history.  Denies any exposure to Covid.  She did do Covid vaccine.  No reactions after  the Covid vaccine.  Did smoke cigarettes 15-year 2000 and 2015 10 to 12 cigarettes a day.  Did not do any passive smoking.  Never did any marijuana.  Elevated electronic cigarettes.  Never use cocaine never used intravenous drug use.   HOME and HOBBY DETAILS :  -[place]  Is lived there for 21 years.  Age of the home is 34 years.  In 2004 there was mold/mildew in the shower curtain.  And then there was mold under the shower.  This was torn down.  This was in 2004 but currently none.  She did some mulch back at work 2 months ago.  No humidifier use no CPAP use.  No nebulizer use.  No steam iron use.  No Jacuzzi use.  No misting Fountain in the house.  No pet birds or parakeets no gerbils.  No feather pillows.  No  mold in the [facility] duct.  No music habits.   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  No otehrr organic antigen exposure that she recollects (MR side note: a different patient in 2016 reported that in 2013-2016 time lot of renovations there and mold +).  Patient reported on September 10, 2019: That between 1966 and 1980 she worked at [employer] and [place].  At that time there is lot of mold exposure in the building.  The building was cleaned out.  Also checked for inorganic antigen exposure and this is negative.  Never worked in a dusty environment.  Never exposed to fumes.   PULMONARY TOXICITY HISTORY (27 items): Use nitrofurantoin in 2019 and 2020.  This is described below.  Question if you can for this there is      Simple office walk 185 feet x  3 laps goal with forehead probe 08/19/2019   O2 used ra  Number laps completed Attempted 3 but desaturated at 2  Comments about pace Normal pace  Resting Pulse Ox/HR 100% and 101/min  Final Pulse Ox/HR 86% and 116/min  Desaturated </= 88% yes  Desaturated <= 3% points Yes , 14  Got Tachycardic >/= 90/min yes  Symptoms at end of test dyspneic  Miscellaneous comments Corrected with  2L [place]    Nuclear medicine cardiac stress test March 2016: Overall Impression:  Normal stress nuclear study with a small, mild, fixed apical defect consistent with thinning; no ischemia. LV Wall Motion:  NL LV Function; NL Wall Motion    CT abdomen lung cuts September 2020: Reported as normal.  I personally visualized and retrospect looks hazy and whether this is atelectasis or some other findings I do not know.  Jul 19, 2019:: Personally visualized shows findings of ILD.  I am not able to see a prior chest x-ray in the system or CT scan of the chest    Results for Susan Hunt, Susan "Susan" (MRN [id number]) as of 08/19/2019 15:18  Ref. Range  03/26/2019 13:45  Creatinine Latest Ref Range: 0.40 - 1.20 mg/dL 2.13   ROS - per HPI Results for Susan Hunt, Susan "Susan" (MRN [id number]) as of 08/19/2019 15:18  Ref. Range 03/26/2019 13:45  Hemoglobin Latest Ref Range: 12.0 - 15.0 g/dL 62.9     OV 08/09/4130  Subjective:  Patient ID: Susan Hunt, female , DOB: September 08, 1945 , age 1 y.o. , MRN: [id number] , ADDRESS: [address] [place] [address]  09/10/2019 -   Chief Complaint  Patient presents with   Follow-up    shortness of breath with exertion and non-productive cough     HPI MAYTHE GOEDE 77 y.o. -presents for follow-up to discuss results of dyspnea work-up.  High-resolution CT chest was personally visualized.  It shows presence of interstitial lung disease.  It needs Fleischner criteria for diagnosis not consistent with UIP.  Per ATS this is alternative diagnosis to UIP.  Radiologist raised the concern of hypersensitive pneumonitis but there is no air-trapping.  The other possibilities NSIP.  She is no longer on nitrofurantoin.  In any event unit she only took it intermittently.  Overall she is stable since last visit.  She had autoimmune profile.  Is only trace positive for ANA.  Her IgE is slightly elevated.  Her PFT shows mild restriction with reduction in diffusion capacity.  We went over her exposure history again.  This time she did recollect being exposed to mold for 15 to 20 years up until 1980 while living in [place] and working at [employer].  She does not recollect mold at behavioral [facility] where she worked recently until she retired.  However another patient of mine did recollect mold in this building at [employer].   Low risk cardiac stress test in 2016 but with ejection fraction 54%.-Since then no overall change in health.  Husband inquired about the low ejection fraction.  In terms of symptoms: She is try to get portable oxygen system.  She is switching company to [employer] because of the  shortage of portable oxygen systems at [facility].     OV 11/12/2019   Subjective:  Patient ID: Susan Hunt, female , DOB: 1945/05/25, age 29 y.o. years. , MRN: [id number],  ADDRESS: [address] [place] [address] PCP  Willow Ora, MD Providers : Treatment Team:  Attending Provider: Kalman Shan, MD Thoracic surgeon: Dr. Dorris Fetch Cardiologist Dr. Jacques Navy  Chief Complaint  Patient presents with   Follow-up    SOB    Follow-up interstitial lung disease not otherwise specified   HPI Susan Hunt 77 y.o. -returns for follow-up.  Her husband is with her.  After her last visit she saw cardiothoracic surgery but we had to hold the surgical lung biopsy because her echocardiogram from September 13, 2019 showed a drop in left ventricular systolic ejection fraction.  Since then she is seen cardiology.  She had a heart catheterization that shows ejection action 40% still low.  Nonobstructive coronary artery disease.  She is followed up with cardiology and she has been started on beta-blocker.  On her father medications was also adjusted.  So far no change in dyspnea.  She tells me overall dyspnea is the same without any change.  Symptom scores shows a two-point shift with worsening but she told the medical assistant that she is feeling worse.  Walking desaturation test today shows - normalcy .  She wants portable oxygen but the first DME company [facility] did not have portable oxygen tanks.  She is found another company [employer] that is able to give her portable oxygen.  She needs a requalifying walk that is documented below.  In terms of surgical lung biopsy: She did have some prominent mediastinal nodes.  It appears Dr. Dorris Fetch has informed them that he will do a EBUS for this.  They have also been told by cardiology that from a cardiac standpoint patient would be a suitable candidate for surgical lung biopsy.  The husband wanted to know if a surgical biopsy is  indicated and also my perspective and safety.  xxxxxxxxxxxxxxxxxxxxxxxxxx Results for Susan Hunt, Susan "Susan" (MRN [id number]) as of 09/10/2019 13:56  Ref. Range 08/23/2019 10:53  FVC-Pre Latest Units: L 2.61  FVC-%Pred-Pre Latest Units: % 80  FEV1-Pre Latest Units: L 2.26  FEV1-%Pred-Pre Latest Units: % 92  Results for Susan Hunt, Susan "Susan" (MRN [id number]) as of 09/10/2019 13:56  Ref. Range 08/23/2019 10:53  DLCO cor Latest Units: ml/min/mmHg 13.76  DLCO cor % pred Latest Units: % 64   Results for Susan Hunt, Susan "Susan" (MRN [id number]) as of 09/10/2019 13:56  Ref. Range 08/23/2019 10:53  TLC Latest Units: L 4.15  TLC % pred Latest Units: % 75   xxxxxxxxxxxxxxxxxxxxxx  CT chest high resolution June 2021 Lungs/Pleura: Mild pulmonary fibrosis in a pattern with apical predominance, featuring irregular peripheral interstitial opacity and scattered areas of subpleural bronchiolectasis. Mild, tubular bronchiectasis throughout. No significant air trapping on expiratory phase imaging. No pleural effusion or pneumothorax.   Upper Abdomen: No acute abnormality.   Musculoskeletal: No chest wall mass or suspicious bone lesions identified.   IMPRESSION: 1. Mild pulmonary fibrosis in a pattern with apical predominance, featuring irregular peripheral interstitial opacity and scattered areas of subpleural bronchiolectasis. Mild, tubular bronchiectasis throughout. Findings are most consistent with an "alternative diagnosis" pattern by ATS pulmonary fibrosis criteria, leading differential considerations chronic fibrotic hypersensitivity pneumonitis or NSIP. 2. Prominent mediastinal lymph nodes, nonspecific and likely reactive. 3. Coronary artery disease.  Aortic Atherosclerosis (ICD10-I70.0).     Electronically Signed   By: Lauralyn Primes M.D.   On: 09/04/2019 15:37    xxxxxxxxxxxxxxxxxxxxxxxxxxxxxxxxxxxxxxxxxxxxxxxxxx  Results for Susan Hunt, Susan "Susan" (MRN [id number]) as of  09/10/2019 13:56  Ref. Range 08/19/2019 16:30 08/19/2019 16:30  SEE BELOW Unknown  Comment  Anti Nuclear Antibody (ANA) Latest Ref Range: NEGATIVE  POSITIVE (A)   ANA Pattern 1 Unknown Cytoplasmic (A)   ANA Titer 1 Unknown 1:80 (H) Negative  Angiotensin-Converting Enzyme Latest Ref Range: 9 - 67 U/L 35   Anti JO-1 Latest Ref Range: 0.0 - 0.9 AI  <0.2  CENTROMERE AB SCREEN Latest Ref Range: 0.0 - 0.9 AI  <0.2  Cyclic Citrullin Peptide Ab Latest Units: UNITS <16   dsDNA Ab Latest Ref Range: 0 - 9 IU/mL  5  ENA RNP Ab Latest Ref Range: 0.0 - 0.9 AI  0.6  ENA SSA (RO) Ab Latest Ref Range: 0.0 - 0.9 AI  <0.2  ENA SSB (LA) Ab Latest Ref Range: 0.0 - 0.9 AI  <0.2  Myeloperoxidase Abs Latest Units: AI <1.0   Serine Protease 3 Latest Units: AI <1.0   RA Latex Turbid. Latest Ref Range: <14 IU/mL <14   ENA SM Ab Ser-aCnc Latest Ref Range: 0.0 - 0.9 AI  <0.2  Chromatin Ab SerPl-aCnc Latest Ref Range: 0.0 - 0.9 AI  <0.2     has a past medical history of Asthma, Depression, Emphysema of lung (HCC), Hemorrhoids, Hyperlipidemia, Hypertension, Hypothyroidism, Obesity, Rosacea, and Tubular adenoma of colon (03/26/2019).  IMPRESSIONS   ECHO 09/13/19  1. Left ventricular ejection fraction, by estimation, is 30 to 35%. The  left ventricle has moderately decreased function. The left ventricle  demonstrates global hypokinesis. Apex poorly visualized, consider repeat  limited echo with contrast to rule out  apical thrombus. The left ventricular internal cavity size was moderately  dilated. Left ventricular diastolic parameters are consistent with Grade I  diastolic dysfunction (impaired relaxation).   2.  Right ventricular systolic function is normal. The right ventricular  size is normal. Tricuspid regurgitation signal is inadequate for assessing  PA pressure.   3. The mitral valve is normal in structure. Trivial mitral valve  regurgitation.   4. The aortic valve was not well visualized. Aortic valve  regurgitation  is not visualized. No aortic stenosis is present.  CATH 8?20/21 Prox LAD to Mid LAD lesion is 30% stenosed. Ost Cx to Prox Cx lesion is 35% stenosed. Prox RCA lesion is 35% stenosed. There is moderate left ventricular systolic dysfunction. LV end diastolic pressure is normal. The left ventricular ejection fraction is 35-45% by visual estimate.   1. Nonobstructive CAD 2. Moderate LV dysfunction. EF estimated at 40% with global hypokinesis 3. Normal LV filling pressures 4. Normal right heart pressures 5. Normal cardiac output.    Plan: medical therapy for LV dysfunction and lipid lowering therapy for nonobstructive CAD.    OV 12/27/2019  Subjective:  Patient ID: Susan Hunt, female , DOB: 20-Nov-1945 , age 19 y.o. , MRN: [id number] , ADDRESS: [address] [place] [address] PCP Willow Ora, MD Patient Care Team: Willow Ora, MD as PCP - General (Family Medicine) Darolyn Rua, MD as Referring Physician (Internal Medicine) Karleen Dolphin as Consulting Physician (Obstetrics and Gynecology) Donnita Falls, FNP as Consulting Physician (Urology) Kalman Shan, MD as Consulting Physician (Pulmonary Disease)  This Provider for this visit: Treatment Team:  Attending Provider: Kalman Shan, MD    12/27/2019 -   Chief Complaint  Patient presents with   Follow-up    discuss biopsy results. SOB sometimes. denies cough or wheezing.     HPI RHEN MARCK 77 y.o. --follow-up interstitial lung disease.  Had surgical biopsy December 13, 2019.  After that she is was in the hospital requiring oxygen but then got discharged without oxygen.  At this point time she is much improved but still having soreness from her chest incision site.  Husband and she are here.  They are concerned about some postoperative atelectasis that was seen.  I visualized the x-ray.  Today walking desaturation test is normal.  I reassured them about this.  But I  will give her incentive spirometer.  The main issue is to discuss the biopsy results: The bronchoalveolar lavage shows mixed cellularity with neutrophilia.  The surgical lung biopsy itself shows UIP with just 1 carcinoid tumor.  Reviewing her clinical history this UIP pattern is now consistent with IPF.  I gave her the diagnosis.  Regarding the carcinoid tumor let: I think this is an incidental finding.  There is no  DIPNECH syndrome  Her current symptom and walk profile are stable.   Results for Susan Hunt, MACNAMARA "Susan" (MRN [id number]) as of 12/27/2019 12:06  Ref. Range 12/13/2019 08:05  Color, Fluid Latest Ref Range: YELLOW  COLORLESS (A)  Total Nucleated Cell Count, Fluid Latest Ref Range: 0 - 1,000 cu mm 18  Lymphs, Fluid Latest Units: % 25  Eos, Fluid Latest Units: % 8  Appearance, Fluid Latest Ref Range: CLEAR  HAZY (A)  Other Cells, Fluid Latest Units: % MESOTHELIAL CELLS NOTED  Neutrophil Count, Fluid Latest Ref Range: 0 - 25 % 44 (H)  Monocyte-Macrophage-Serous Fluid Latest Ref Range: 50 - 90 % 23 (L)    OV 02/04/2020   Subjective:  Patient ID: Susan Hunt, female , DOB: 11/09/1945, age 36 y.o. years. , MRN: [id number],  ADDRESS: [address] [place] [address] PCP  Mardelle Matte,  Malachi Bonds, MD Providers : Treatment Team:  Attending Provider: Kalman Shan, MD Patient Care Team: Willow Ora, MD as PCP - General (Family Medicine) Darolyn Rua, MD as Referring Physician (Internal Medicine) Karleen Dolphin as Consulting Physician (Obstetrics and Gynecology) Donnita Falls, FNP as Consulting Physician (Urology) Kalman Shan, MD as Consulting Physician (Pulmonary Disease)    Chief Complaint  Patient presents with   Follow-up    ILD, doing well   Follow-up idiopathic pulmonary fibrosis diagnosed on surgical lung biopsy 12/13/2019.  Diagnosis formally given on 12/27/2019 Commenced pirfenidone late October 2021/early November  2021     HPI LIRIA PARCEL 77 y.o. -returns for follow-up with her husband.  This visit is mainly to focus on pirfenidone uptake.  She has been taking pirfenidone for 3 weeks or more.  She is currently on full dose 3 pills 3 times a day.  The husband wants to know when we would roll to a full largest x1 pill 3 times daily.  I indicated to them that it would be 3 to 4 months of stability with pirfenidone before we made the decision.  Currently she is tolerating pirfenidone well.  She did have some mild nausea and she thinks it is because she did not take a full meal.  Overall she is handling pirfenidone well without any problems.  No fatigue no weight loss.  Respiratory wise she is stable.  She did get in touch with a local support group.  She is asking about pulmonary rehabilitation referral and she is interested in this.  One of the main issues they wanted to discuss was the fact the pirfenidone refill in the supply chain was not consistent.  There was anxiety that they were not going to get the refill.  She had to call the office.  She believes that it is because she made the call to the office that the refill was sent.  She says she tried to call [place] without any response for a long time.  I have indicated to her that I will ask our pharmacist to review and explained the logistics.  She will have a repeat LFT today      OV 04/02/2020  Subjective:  Patient ID: Susan Hunt, female , DOB: 27-Apr-1945 , age 69 y.o. , MRN: [id number] , ADDRESS: [address] [place] [address] PCP Willow Ora, MD Patient Care Team: Willow Ora, MD as PCP - General (Family Medicine) Darolyn Rua, MD as Referring Physician (Internal Medicine) Karleen Dolphin as Consulting Physician (Obstetrics and Gynecology) Donnita Falls, FNP as Consulting Physician (Urology) Kalman Shan, MD as Consulting Physician (Pulmonary Disease)  This Provider for this visit: Treatment Team:   Attending Provider: Kalman Shan, MD    04/02/2020 -   Chief Complaint  Patient presents with   Follow-up    Pt states she has been doing well since last visit and denies any complaints.   Follow-up idiopathic pulmonary fibrosis diagnosed on surgical lung biopsy 12/13/2019.  Diagnosis formally given on 12/27/2019 Commenced pirfenidone late October 2021/early November 2021  HPI LAVORIS DEJOURNETT 77 y.o. -returns for follow-up.  Presents with her husband.  She is tolerating pirfenidone fine with just very mild occasional nausea.  She wants to roll over to the 1 pill 3 times daily.  I have sent a message through secure chat to the pharmacist.  She is going to start pulmonary rehabilitation in February 2022.  We discussed lung transplantation but at this  point we will hold off.  She will check liver function test today.    There are no new issues.  She is already touch base with the support group her symptom score shows stability.  Of note she has returned to portable oxygen because she feels she does not need it.  Walking desaturation test confirms that.    She is interested in clinical trials.  She is touch base with cardiology and looking at a heart failure trial there.  She is also given her name for pulmonary fibrosis trials but currently there is a backlog.  I then emailed Brunilda Payor cardiology Teacher, English as a foreign language and she replied saying, "She was in our [facility] trial which her enrollment is complete. I am not aware of any other trial we are screening her for currently but will ask the team. I think IPF trial option would be in her best interest. I also think IPF will exclude her from our HF device trials.  "   PF   OV 07/23/2020  Subjective:  Patient ID: Susan Hunt, female , DOB: 07/24/1945 , age 32 y.o. , MRN: [id number] , ADDRESS: [address] [place] [address] PCP Willow Ora, MD Patient Care Team: Willow Ora, MD as PCP - General (Family  Medicine) Parke Poisson, MD as PCP - Cardiology (Cardiology) Darolyn Rua, MD as Referring Physician (Internal Medicine) Karleen Dolphin as Consulting Physician (Obstetrics and Gynecology) Donnita Falls, FNP as Consulting Physician (Urology) Kalman Shan, MD as Consulting Physician (Pulmonary Disease)  This Provider for this visit: Treatment Team:  Attending Provider: Kalman Shan, MD    07/23/2020 -   Chief Complaint  Patient presents with   Follow-up    IPF/ILD, denies changes   Follow-up idiopathic pulmonary fibrosis diagnosed on surgical lung biopsy 12/13/2019.  Diagnosis formally given on 12/27/2019 Commenced pirfenidone late October 2021/early November 2021. Screen failed for Pliant Part D trial due to soc coreg intake - Spring 2022. IPF final dx on MDD Jul 21, 2020  HPI SOMA GENTLE 77 y.o. -returns for follow-up.  At this point in time she is tolerating pirfenidone well.  In fact no side effects.  Her symptom score is stable.  She had high-resolution CT chest as part of a research protocol and there is no progressions in September 2021.  Her walking desaturation test shows a tendency to drop.  Is unclear if it is worse or the same because she walks faster than usual.  She feels stable.  However April 2022 she had recent PFTs FVC, 2.122, 71.6%, DLCO: 11.4, 54% -> this would suggest a decline.  Of note she try to qualify for the phase 2 study CALLED  Pliant..  However she is on carvedilol for hypertension control.  Hypertensive related to the reason of her cardiomyopathy.  Therefore she is screen failed.  She has good control of blood pressure with carvedilol.  She is interested in other research trials that she might qualify for.  Her husband Taiya Bogus is with her today.        OV 01/04/2021  Subjective:  Patient ID: Susan Hunt, female , DOB: 04/25/1945 , age 68 y.o. , MRN: [id number] , ADDRESS: [address] [place]  [id number] PCP Willow Ora, MD Patient Care Team: Willow Ora, MD as PCP - General (Family Medicine) Parke Poisson, MD as PCP - Cardiology (Cardiology) Darolyn Rua, MD as Referring Physician (Internal Medicine) Karleen Dolphin as Consulting Physician (Obstetrics  and Gynecology) Donnita Falls, FNP as Consulting Physician (Urology) Kalman Shan, MD as Consulting Physician (Pulmonary Disease)  This Provider for this visit: Treatment Team:  Attending Provider: Kalman Shan, MD    01/04/2021 -   Chief Complaint  Patient presents with   Follow-up    Pt states she had a spell 4 weeks ago where her BP dropped too low to where she passed out and fell into a dresser which caused her to break some ribs. Pt said since this happened, she has had a little increased SOB as she is not able to take a deep breath.   Follow-up idiopathic pulmonary fibrosis diagnosed on surgical lung biopsy 12/13/2019.  Diagnosis formally given on 12/27/2019 Commenced pirfenidone late October 2021/early November 2021. Screen failed for Pliant Part D trial due to soc coreg intake - Spring 2022. IPF final dx on MDD Jul 21, 2020  HPI SANI DIERSEN 77 y.o. -returns for follow-up.  Last seen in late spring early summer 2022.  She presents with her husband Trey Paula.  She tells me that in August 2022 she broke her right tibia after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 Classification       Desirable:  < 200 mg/dL               Borderline High:  200 - 239 mg/dL          High:  > = 981 mg/dL   Triglycerides 191.4 0.0 - 149.0 mg/dL    Comment: Normal:  <782 mg/dLBorderline High:  150 - 199 mg/dL   HDL 95.62 >13.08 mg/dL   VLDL 65.7 0.0 - 84.6 mg/dL   LDL Cholesterol 61 0 - 99 mg/dL   Total CHOL/HDL Ratio 3     Comment:                Men          Women1/2 Average Risk     3.4          3.3Average Risk          5.0          4.42X Average Risk          9.6          7.13X Average Risk          15.0          11.0                       NonHDL 90.22     Comment: NOTE:  Non-HDL goal should be 30 mg/dL higher than patient's LDL goal (i.e. LDL goal of < 70 mg/dL, would have non-HDL goal of < 100 mg/dL)  Hemoglobin N6E     Status: None   Collection Time: 01/10/23 11:20 AM  Result Value Ref Range   Hgb A1c MFr Bld 6.3 4.6 - 6.5 %    Comment: Glycemic Control Guidelines for People with Diabetes:Non Diabetic:  <6%Goal of Therapy: <7%Additional Action Suggested:  >8%   TSH     Status: Abnormal   Collection Time: 01/10/23 11:20 AM  Result Value Ref Range   TSH 0.22 (L) 0.35 - 5.50  uIU/mL  Microalbumin / creatinine urine ratio     Status: None   Collection Time: 01/10/23 11:20 AM  Result Value Ref Range   Microalb, Ur 1.4 0.0 - 1.9 mg/dL   Creatinine,U 952.8 mg/dL   Microalb Creat  Ratio 0.8 0.0 - 30.0 mg/g         has a past medical history of Anemia, Asthma, CHF (congestive heart failure) (HCC), Depression, Diabetes mellitus without complication (HCC), Emphysema of lung (HCC), GERD (gastroesophageal reflux disease), Hemorrhoids, History of kidney stones, Hyperlipidemia, Hypertension, Hypothyroidism, Obesity, Pulmonary fibrosis (HCC), Rosacea, Sleep apnea, and Tubular adenoma of colon (03/26/2019).   reports that she quit smoking about 9 years ago. Her smoking use included cigarettes. She started smoking about 24 years ago. She has a 7.5 pack-year smoking history. She has never used smokeless tobacco.  Past Surgical History:  Procedure Laterality Date   ABDOMINAL HYSTERECTOMY     ANTERIOR AND POSTERIOR VAGINAL REPAIR  2020   CARDIAC CATHETERIZATION  11/01/2019   CATARACT EXTRACTION W/ INTRAOCULAR LENS IMPLANT Bilateral 2016   COLONOSCOPY     CYSTOSCOPY     DENTAL SURGERY  2016   Dental Implant   FRACTURE SURGERY Left 2013   Left ankle   INTERCOSTAL NERVE BLOCK Right 12/13/2019   Procedure: INTERCOSTAL NERVE BLOCK;  Surgeon: Loreli Slot, MD;  Location: [facility] OR;  Service: Thoracic;  Laterality: Right;   LUNG BIOPSY Right 12/13/2019   Procedure: LUNG BIOPSY;  Surgeon: Loreli Slot, MD;  Location: [facility] OR;  Service: Thoracic;  Laterality: Right;   LYMPH NODE BIOPSY Right 12/13/2019   Procedure: LYMPH NODE BIOPSY;  Surgeon: Loreli Slot, MD;  Location: [facility] OR;  Service: Thoracic;  Laterality: Right;   medtronix nerve stimulator  07/18/2019   Complete InterStim Sacral Neuromudulation system implantation 07/18/19, [facility]   NASAL SINUS SURGERY     RIGHT/LEFT HEART CATH AND CORONARY ANGIOGRAPHY N/A 11/01/2019   Procedure: RIGHT/LEFT HEART CATH  AND CORONARY ANGIOGRAPHY;  Surgeon: Swaziland, Peter M, MD;  Location: [facility] INVASIVE CV LAB;  Service: Cardiovascular;  Laterality: N/A;   VAGINECTOMY, PARTIAL     VIDEO BRONCHOSCOPY N/A 12/13/2019   Procedure: VIDEO BRONCHOSCOPY;  Surgeon: Loreli Slot, MD;  Location: [facility] OR;  Service: Thoracic;  Laterality: N/A;    Allergies  Allergen Reactions   Ace Inhibitors Cough   Clarithromycin     REACTION: GI sx   Codeine Nausea Only   Sulfamethoxazole Nausea Only   Telmisartan Other (See Comments)    sleepy    Immunization History  Administered Date(s) Administered   Fluad Quad(high Dose 65+) 12/09/2019, 12/11/2020   Fluad Trivalent(High Dose 65+) 12/20/2022   Hepatitis A, Adult 04/06/2018   Influenza Split 01/06/2009   Influenza, High Dose Seasonal PF 12/17/2014, 12/20/2015, 01/15/2017, 12/18/2017, 11/28/2018   Influenza, Seasonal, Injecte, Preservative Fre 01/07/2014, 12/17/2014   Influenza,inj,Quad PF,6+ Mos 01/15/2017   Influenza,trivalent, recombinat, inj, PF 12/03/2012   Influenza-Unspecified 01/15/2017   Moderna Covid-19 Vaccine Bivalent Booster 49yrs & up 03/18/2021   Moderna SARS-COV2 Booster Vaccination 01/07/2020   Moderna Sars-Covid-2 Vaccination 04/17/2019, 05/15/2019, 08/20/2020   Pneumococcal Conjugate-13 01/07/2014   Pneumococcal Polysaccharide-23 11/23/2011   Td 03/14/2006, 10/16/2011   Zoster Recombinant(Shingrix) 10/21/2017, 12/18/2017   Zoster, Live 12/06/2013    Family History  Problem Relation Age of Onset   Heart disease Mother    Hypertension Mother    Osteoporosis Mother    Stroke Mother    Alcohol abuse Father    Diabetes Maternal Grandmother    Drug abuse Son    Breast cancer Neg Hx      Current Outpatient Medications:    amLODipine (NORVASC) 5 MG tablet, Take 1 tablet (5 mg total) by mouth daily., Disp: 90 tablet, Rfl: 3  carvedilol (COREG) 25 MG tablet, Take 1 tablet (25 mg total) by mouth 2 (two) times daily., Disp: 180 tablet, Rfl: 2    cetirizine (ZYRTEC) 10 MG tablet, Take 10 mg by mouth daily., Disp: , Rfl:    FARXIGA 10 MG TABS tablet, Take 1 tablet (10 mg total) by mouth daily., Disp: 90 tablet, Rfl: 3   levothyroxine (SYNTHROID) 112 MCG tablet, Take 1 tablet (112 mcg total) by mouth daily., Disp: 90 tablet, Rfl: 3   montelukast (SINGULAIR) 10 MG tablet, TAKE 1 TABLET BY MOUTH EVERY NIGHT AT BEDTIME, Disp: 90 tablet, Rfl: 1   Multiple Vitamin (MULTIVITAMIN WITH MINERALS) TABS tablet, Take 1 tablet by mouth daily., Disp: , Rfl:    omeprazole (PRILOSEC) 20 MG capsule, TAKE ONE CAPSULE BY MOUTH DAILY ON AN EMPTY STOMACH, Disp: 90 capsule, Rfl: 3   ondansetron (ZOFRAN-ODT) 4 MG disintegrating tablet, DISSOLVE ONE TABLET BY MOUTH EVERY 8 HOURS AS NEEDED FOR NAUSEA/ VOMITING, Disp: 20 tablet, Rfl: 0   Pirfenidone (ESBRIET) 801 MG TABS, Take 801 mg by mouth with breakfast, with lunch, and with evening meal., Disp: 90 tablet, Rfl: 11   rosuvastatin (CRESTOR) 10 MG tablet, TAKE ONE TABLET BY MOUTH DAILY, Disp: 90 tablet, Rfl: 3   Semaglutide-Weight Management (WEGOVY) 1 MG/0.5ML SOAJ, Inject 1 mg into the skin once a week., Disp: 2 mL, Rfl: 0   spironolactone (ALDACTONE) 25 MG tablet, Take 1 tablet (25 mg total) by mouth daily., Disp: 90 tablet, Rfl: 3   valsartan (DIOVAN) 320 MG tablet, Take 1 tablet (320 mg total) by mouth daily., Disp: 90 tablet, Rfl: 3   venlafaxine XR (EFFEXOR-XR) 150 MG 24 hr capsule, Take 1 capsule (150 mg total) by mouth daily., Disp: 90 capsule, Rfl: 3      Objective:   Vitals:   02/16/23 1502  BP: (!) 148/79  Pulse: 73  SpO2: 93%  Weight: 192 lb 3.2 oz (87.2 kg)  Height: 5\' 7"  (1.702 m)    Estimated body mass index is 30.1 kg/m as calculated from the following:   Height as of this encounter: 5\' 7"  (1.702 m).   Weight as of this encounter: 192 lb 3.2 oz (87.2 kg).  @WEIGHTCHANGE @  [employer]   02/16/23 1502  Weight: 192 lb 3.2 oz (87.2 kg)     Physical Exam   General: No distress.  Looks well O2 at rest: no Cane present: no Sitting in wheel chair: no Frail: no Obese: no Neuro: Alert and Oriented x 3. GCS 15. Speech normal Psych: Pleasant Resp:  Barrel Chest - no.  Wheeze - no, Crackles - no, No overt respiratory distress CVS: Normal heart sounds. Murmurs - no Ext: Stigmata of Connective Tissue Disease - no HEENT: Normal upper airway. PEERL +. No post nasal drip        Assessment:       ICD-10-CM   1. IPF (idiopathic pulmonary fibrosis) (HCC)  J84.112     2. Encounter for therapeutic drug monitoring  Z51.81          Plan:     Patient Instructions     ICD-10-CM   1. IPF (idiopathic pulmonary fibrosis) (HCC)  J84.112     2. Encounter for therapeutic drug monitoring  Z51.81       IPF stable based on symptoms, exercise test 02/16/2023\ and also CT scan chest summer 2024 LFT normal 6 weeks ago  Plan -Use oxygen for any exertion greater than 200 yards that will drop your pulse  ox less than 88%   - use your personal innogen -Continue oxygen at night -Continue pirfenidone per schedule -Check liver function test in 3 months - do spiro and dlco in 6 months  Followup  -  15 minutes in 6 months; routine IPF followup   FOLLOWUP Return in about 6 months (around 08/17/2023) for 15 min visit, after Cleda Daub and DLCO, ILD, with Dr Marchelle Gearing, Face to Face Visit.    SIGNATURE    Dr. Kalman Shan, M.D., F.C.C.P,  Pulmonary and Critical Care Medicine Staff Physician, [facility] Center Director - Interstitial Lung Disease  Program  Pulmonary Fibrosis [facility] at [facility] [place], [place], [address]  Pager: [phone], If no answer or between  15:00h - 7:00h: call [phone] Telephone: [phone]  3:27 PM 02/16/2023    HIGh Complexity  OFFICE   2021 E/M guidelines, first released in 2021, with minor revisions added in 2023. Must meet the requirements for 2 out of 3 dimensions to qualify.    Number  and complexity of problems addressed Amount and/or complexity of data reviewed Risk of complications and/or morbidity  Severe exacerbation of chronic illness  Acute or chronic illnesses that may pose a threat to life or bodily function, e.g., multiple trauma, acute MI, pulmonary embolus, severe respiratory distress, progressive rheumatoid arthritis, psychiatric illness with potential threat to self or others, peritonitis, acute renal failure, abrupt change in neurological status Must meet the requirements for 2 of 3 of the categories)  Category 1: Tests and documents, historian  Any combination of 3 of the following:  Assessment requiring an independent historian  Review of prior external note(s) from each unique source  Review of results of each unique test  Ordering of each unique test    Category 2: Interpretation of tests    Independent interpretation of a test performed by another physician/other qualified health care professional (not separately reported)  Category 3: Discuss management/tests  Discussion of management or test interpretation with external physician/other qualified health care professional/appropriate source (not separately reported)  HIGH risk of morbidity from additional diagnostic testing or treatment Examples only:  Drug therapy requiring intensive monitoring for toxicity  Decision for elective major surgery with identified pateint or procedure risk factors  Decision regarding hospitalization or escalation of level of care  Decision for DNR or to de-escalate care   Parenteral controlled  substances

## 2023-02-16 NOTE — Patient Instructions (Addendum)
ICD-10-CM   1. IPF (idiopathic pulmonary fibrosis) (HCC)  J84.112     2. Encounter for therapeutic drug monitoring  Z51.81       IPF stable based on symptoms, exercise test 02/16/2023\ and also CT scan chest summer 2024 LFT normal 6 weeks ago  Plan -Use oxygen for any exertion greater than 200 yards that will drop your pulse ox less than 88%   - use your personal innogen -Continue oxygen at night -Continue pirfenidone per schedule -Check liver function test in 3 months - do spiro and dlco in 6 months  Followup  -  15 minutes in 6 months; routine IPF followup

## 2023-02-17 ENCOUNTER — Other Ambulatory Visit (HOSPITAL_COMMUNITY): Payer: Self-pay

## 2023-03-28 NOTE — Telephone Encounter (Signed)
 Enrolled patient into PF grant for PAF: Award Period: 09/29/2022 - 03/27/2024 Cardholder: [id number] BIN: [id number] PCN: [id number] Group: [id number] For pharmacy inquiries, contact PDMI at [phone]. Amount: $4000  Spoke with patient - she states she has not received communication from [employer] about 2025 renewal. Per rep at [place] patient is no longer eligible due to not meeting out-of-pocket maximum based on household income.  Please run pirfenidone  BIV (no longer eligible for [employer] PAP)  Sherry Pennant, PharmD, MPH, BCPS, CPP Clinical Pharmacist (Rheumatology and Pulmonology)

## 2023-03-29 ENCOUNTER — Encounter: Payer: Self-pay | Admitting: Internal Medicine

## 2023-03-29 ENCOUNTER — Other Ambulatory Visit: Payer: Self-pay | Admitting: Internal Medicine

## 2023-03-29 ENCOUNTER — Telehealth: Payer: Self-pay | Admitting: Internal Medicine

## 2023-03-29 DIAGNOSIS — E66811 Obesity, class 1: Secondary | ICD-10-CM

## 2023-03-29 NOTE — Telephone Encounter (Signed)
 Patient checking on patient message for Esbriet . Needs generic, Patient phone number is [phone].

## 2023-03-31 ENCOUNTER — Other Ambulatory Visit: Payer: Self-pay

## 2023-03-31 ENCOUNTER — Other Ambulatory Visit (HOSPITAL_COMMUNITY): Payer: Self-pay

## 2023-03-31 MED ORDER — PIRFENIDONE 801 MG PO TABS
801.0000 mg | ORAL_TABLET | Freq: Three times a day (TID) | ORAL | 5 refills | Status: DC
  Filled 2023-03-31: qty 90, 30d supply, fill #0
  Filled 2023-06-10 – 2023-06-13 (×2): qty 90, 30d supply, fill #1
  Filled 2023-07-06 (×2): qty 90, 30d supply, fill #2
  Filled 2023-08-04: qty 90, 30d supply, fill #3
  Filled 2023-09-06: qty 90, 30d supply, fill #4
  Filled 2023-10-05: qty 90, 30d supply, fill #5

## 2023-03-31 NOTE — Progress Notes (Signed)
Specialty Pharmacy Initial Fill Coordination Note  Susan Hunt is a 78 y.o. female contacted today regarding initial fill of specialty medication(s) Pirfenidone   Patient requested Delivery   Delivery date: 04/04/23   Verified address: [address]   [place] [address]   Medication will be filled on 04/03/23.   Patient has grant on file and is aware of $0 copayment.

## 2023-03-31 NOTE — Telephone Encounter (Signed)
Submitted a Prior Authorization request to [facility] for PIRFENIDONE via CoverMyMeds. Will update once we receive a response.  Key: [id number]   Per automated response: This medication or product was previously approved on [id number] from 2023-03-15 to 2024-03-13. Providers contact us at [phone] for further assistance.  Per test claim, copay for 30 days is $189.75.  Spoke with patient. She has about 15 days of medication remaining. Advised that [facility] will reach out to set up first shipment to house but perhaps later next week  Chesley Mires, PharmD, MPH, BCPS, CPP Clinical Pharmacist (Rheumatology and Pulmonology)

## 2023-04-01 NOTE — Progress Notes (Signed)
Patient no longer eligible for [employer] PAP for Esbriet. She has been enrolled into a pirfenidone grant to help with copay.  Dose: 801mg  three times daily  Check LFTs every 3 months  Chesley Mires, PharmD, MPH, BCPS, CPP Clinical Pharmacist (Rheumatology and Pulmonology)

## 2023-04-03 ENCOUNTER — Other Ambulatory Visit: Payer: Self-pay

## 2023-04-05 ENCOUNTER — Other Ambulatory Visit: Payer: Self-pay | Admitting: Family Medicine

## 2023-04-06 NOTE — Telephone Encounter (Signed)
I have received the following message from patient "Just spoke to my insurance.  You will need to request prior authorization from united health care and use pirfenidone name which is generic   Thank you"  Routing to RX prior Auth

## 2023-04-06 NOTE — Telephone Encounter (Signed)
I have received the following message from pt "Tried to fill RX for esbriet.  Told I had to get dr to send to my regular pharmacy which is [facility] on [address]. I no longer qualify for their program.   Thank you Pam Helin"  Will route to [facility] and Pharmacy team, please advise

## 2023-04-06 NOTE — Telephone Encounter (Signed)
Closing message this was handled.  April 06, 2023 Murrell Redden, RPH-CPP     04/06/23 12:20 PM Note This is an old message and has been resolved

## 2023-04-06 NOTE — Telephone Encounter (Signed)
This is an old message and has been resolved

## 2023-04-06 NOTE — Telephone Encounter (Signed)
 Specialty medication

## 2023-04-07 ENCOUNTER — Encounter: Payer: Self-pay | Admitting: Internal Medicine

## 2023-04-10 ENCOUNTER — Encounter: Payer: Self-pay | Admitting: Internal Medicine

## 2023-04-14 ENCOUNTER — Other Ambulatory Visit: Payer: Self-pay

## 2023-04-17 ENCOUNTER — Encounter: Payer: Self-pay | Admitting: Pharmacist

## 2023-04-17 DIAGNOSIS — E66811 Obesity, class 1: Secondary | ICD-10-CM

## 2023-04-17 DIAGNOSIS — E119 Type 2 diabetes mellitus without complications: Secondary | ICD-10-CM

## 2023-04-18 ENCOUNTER — Other Ambulatory Visit (HOSPITAL_COMMUNITY): Payer: Self-pay

## 2023-04-18 ENCOUNTER — Other Ambulatory Visit: Payer: Self-pay

## 2023-04-18 MED ORDER — WEGOVY 1.7 MG/0.75ML ~~LOC~~ SOAJ
1.7000 mg | SUBCUTANEOUS | 0 refills | Status: DC
  Filled 2023-04-18: qty 3, 28d supply, fill #0

## 2023-04-19 ENCOUNTER — Telehealth: Payer: Self-pay | Admitting: Pharmacy Technician

## 2023-04-19 ENCOUNTER — Other Ambulatory Visit (HOSPITAL_COMMUNITY): Payer: Self-pay

## 2023-04-19 NOTE — Telephone Encounter (Addendum)
 Pharmacy Patient Advocate Encounter   Received notification from  fax  that prior authorization for Wegovy  1.7MG /0.75ML auto-injectors is required/requested.   Insurance verification completed.   The patient is insured through [employer] .   Per test claim: PA required; PA submitted to above mentioned insurance via CoverMyMeds Key/confirmation #/EOC [id number] Status is pending

## 2023-04-20 ENCOUNTER — Other Ambulatory Visit (HOSPITAL_COMMUNITY): Payer: Self-pay

## 2023-04-20 ENCOUNTER — Telehealth: Payer: Self-pay | Admitting: Pharmacy Technician

## 2023-04-20 NOTE — Telephone Encounter (Signed)
 Pharmacy Patient Advocate Encounter   Received notification from Pt Calls Messages that prior authorization for Wegovy  1.7MG /0.75ML auto-injectors is required/requested.   Insurance verification completed.   The patient is insured through [employer] .   THE PA WAS DENIED SO I STARTED AN APPEAL. [id number] appeal reference id [id number]

## 2023-04-20 NOTE — Telephone Encounter (Signed)
 Pa denied for following reason     appeal request has been Submitted. New Encounter created for follow up. For additional info see Pharmacy Prior Auth telephone encounter from 04/20/23.

## 2023-04-21 ENCOUNTER — Other Ambulatory Visit (HOSPITAL_BASED_OUTPATIENT_CLINIC_OR_DEPARTMENT_OTHER): Payer: Self-pay

## 2023-04-21 ENCOUNTER — Telehealth (HOSPITAL_BASED_OUTPATIENT_CLINIC_OR_DEPARTMENT_OTHER): Payer: Self-pay

## 2023-04-21 ENCOUNTER — Other Ambulatory Visit (HOSPITAL_COMMUNITY): Payer: Self-pay

## 2023-04-21 NOTE — Telephone Encounter (Signed)
 Please disregard last message does not pertain to this patient.

## 2023-04-21 NOTE — Telephone Encounter (Signed)
 Spoke with patient about increase dose of Cellcept. Pt verbalized understanding.

## 2023-04-24 ENCOUNTER — Other Ambulatory Visit (HOSPITAL_COMMUNITY): Payer: Self-pay

## 2023-04-25 NOTE — Telephone Encounter (Signed)
Faxed the required information sent by fax

## 2023-04-27 ENCOUNTER — Other Ambulatory Visit (HOSPITAL_COMMUNITY): Payer: Self-pay

## 2023-05-01 ENCOUNTER — Other Ambulatory Visit (HOSPITAL_COMMUNITY): Payer: Self-pay

## 2023-05-03 ENCOUNTER — Other Ambulatory Visit (HOSPITAL_COMMUNITY): Payer: Self-pay

## 2023-05-03 ENCOUNTER — Telehealth: Payer: Self-pay

## 2023-05-03 ENCOUNTER — Encounter: Payer: Self-pay | Admitting: Internal Medicine

## 2023-05-03 MED ORDER — OZEMPIC (2 MG/DOSE) 8 MG/3ML ~~LOC~~ SOPN
2.0000 mg | PEN_INJECTOR | SUBCUTANEOUS | 5 refills | Status: DC
  Filled 2023-05-03: qty 3, 28d supply, fill #0

## 2023-05-03 NOTE — Addendum Note (Signed)
Addended by: Cheree Ditto on: 05/03/2023 12:31 PM   Modules accepted: Orders

## 2023-05-03 NOTE — Telephone Encounter (Signed)
Pharmacy Patient Advocate Encounter   Received notification from Physician's Office that prior authorization for [facility] is required/requested.   Insurance verification completed.   The patient is insured through [employer] .   Per test claim: The current 28 day co-pay is, $47.  No PA needed at this time. This test claim was processed through [facility]- copay amounts may vary at other pharmacies due to pharmacy/plan contracts, or as the patient moves through the different stages of their insurance plan.

## 2023-05-03 NOTE — Telephone Encounter (Signed)
Faxed more information 05/03/23 11:44am

## 2023-05-04 ENCOUNTER — Other Ambulatory Visit: Payer: Self-pay

## 2023-05-04 ENCOUNTER — Other Ambulatory Visit (HOSPITAL_COMMUNITY): Payer: Self-pay

## 2023-05-04 MED ORDER — FARXIGA 10 MG PO TABS: 10.0000 mg | ORAL_TABLET | Freq: Every day | ORAL | 1 refills | Status: DC

## 2023-05-05 NOTE — Telephone Encounter (Signed)
I am sending this to you since I saw they just sent you another encounter about this. This was denied because she has not had a prior stroke or a  MI. The full denial letter is in media.

## 2023-05-12 ENCOUNTER — Observation Stay (HOSPITAL_BASED_OUTPATIENT_CLINIC_OR_DEPARTMENT_OTHER)
Admission: EM | Admit: 2023-05-12 | Discharge: 2023-05-14 | Disposition: A | Discharge status: Home / Self Care | Payer: Medicare Other | Attending: Family Medicine | Admitting: Family Medicine

## 2023-05-12 ENCOUNTER — Encounter (HOSPITAL_BASED_OUTPATIENT_CLINIC_OR_DEPARTMENT_OTHER): Payer: Self-pay | Admitting: *Deleted

## 2023-05-12 ENCOUNTER — Other Ambulatory Visit: Payer: Self-pay

## 2023-05-12 ENCOUNTER — Emergency Department (HOSPITAL_BASED_OUTPATIENT_CLINIC_OR_DEPARTMENT_OTHER): Payer: Medicare Other

## 2023-05-12 ENCOUNTER — Emergency Department (HOSPITAL_BASED_OUTPATIENT_CLINIC_OR_DEPARTMENT_OTHER): Payer: Medicare Other | Admitting: Radiology

## 2023-05-12 DIAGNOSIS — E1159 Type 2 diabetes mellitus with other circulatory complications: Secondary | ICD-10-CM | POA: Diagnosis present

## 2023-05-12 DIAGNOSIS — I110 Hypertensive heart disease with heart failure: Secondary | ICD-10-CM | POA: Diagnosis not present

## 2023-05-12 DIAGNOSIS — R059 Cough, unspecified: Secondary | ICD-10-CM | POA: Diagnosis not present

## 2023-05-12 DIAGNOSIS — K920 Hematemesis: Principal | ICD-10-CM | POA: Insufficient documentation

## 2023-05-12 DIAGNOSIS — F32A Depression, unspecified: Secondary | ICD-10-CM | POA: Diagnosis not present

## 2023-05-12 DIAGNOSIS — E039 Hypothyroidism, unspecified: Secondary | ICD-10-CM | POA: Diagnosis not present

## 2023-05-12 DIAGNOSIS — R112 Nausea with vomiting, unspecified: Secondary | ICD-10-CM | POA: Diagnosis present

## 2023-05-12 DIAGNOSIS — K226 Gastro-esophageal laceration-hemorrhage syndrome: Secondary | ICD-10-CM | POA: Insufficient documentation

## 2023-05-12 DIAGNOSIS — J45909 Unspecified asthma, uncomplicated: Secondary | ICD-10-CM | POA: Diagnosis not present

## 2023-05-12 DIAGNOSIS — Z79899 Other long term (current) drug therapy: Secondary | ICD-10-CM | POA: Diagnosis not present

## 2023-05-12 DIAGNOSIS — K7689 Other specified diseases of liver: Secondary | ICD-10-CM | POA: Diagnosis not present

## 2023-05-12 DIAGNOSIS — E119 Type 2 diabetes mellitus without complications: Secondary | ICD-10-CM

## 2023-05-12 DIAGNOSIS — I5020 Unspecified systolic (congestive) heart failure: Secondary | ICD-10-CM | POA: Diagnosis present

## 2023-05-12 DIAGNOSIS — J84112 Idiopathic pulmonary fibrosis: Secondary | ICD-10-CM | POA: Diagnosis not present

## 2023-05-12 DIAGNOSIS — I5022 Chronic systolic (congestive) heart failure: Secondary | ICD-10-CM | POA: Diagnosis not present

## 2023-05-12 DIAGNOSIS — R1110 Vomiting, unspecified: Secondary | ICD-10-CM | POA: Diagnosis present

## 2023-05-12 DIAGNOSIS — Z87891 Personal history of nicotine dependence: Secondary | ICD-10-CM | POA: Insufficient documentation

## 2023-05-12 DIAGNOSIS — I779 Disorder of arteries and arterioles, unspecified: Secondary | ICD-10-CM

## 2023-05-12 DIAGNOSIS — N281 Cyst of kidney, acquired: Secondary | ICD-10-CM | POA: Diagnosis not present

## 2023-05-12 DIAGNOSIS — R109 Unspecified abdominal pain: Secondary | ICD-10-CM | POA: Diagnosis not present

## 2023-05-12 DIAGNOSIS — Z1152 Encounter for screening for COVID-19: Secondary | ICD-10-CM | POA: Diagnosis not present

## 2023-05-12 DIAGNOSIS — K449 Diaphragmatic hernia without obstruction or gangrene: Secondary | ICD-10-CM | POA: Diagnosis not present

## 2023-05-12 DIAGNOSIS — E782 Mixed hyperlipidemia: Secondary | ICD-10-CM | POA: Diagnosis present

## 2023-05-12 DIAGNOSIS — R918 Other nonspecific abnormal finding of lung field: Secondary | ICD-10-CM | POA: Diagnosis not present

## 2023-05-12 DIAGNOSIS — I152 Hypertension secondary to endocrine disorders: Secondary | ICD-10-CM | POA: Diagnosis present

## 2023-05-12 DIAGNOSIS — J849 Interstitial pulmonary disease, unspecified: Secondary | ICD-10-CM | POA: Diagnosis present

## 2023-05-12 DIAGNOSIS — R0989 Other specified symptoms and signs involving the circulatory and respiratory systems: Secondary | ICD-10-CM | POA: Diagnosis not present

## 2023-05-12 DIAGNOSIS — R0602 Shortness of breath: Secondary | ICD-10-CM | POA: Diagnosis present

## 2023-05-12 LAB — COMPREHENSIVE METABOLIC PANEL
ALT: 14 U/L (ref 0–44)
AST: 20 U/L (ref 15–41)
Albumin: 4.8 g/dL (ref 3.5–5.0)
Alkaline Phosphatase: 83 U/L (ref 38–126)
Anion gap: 13 (ref 5–15)
BUN: 19 mg/dL (ref 8–23)
CO2: 27 mmol/L (ref 22–32)
Calcium: 10.5 mg/dL — ABNORMAL HIGH (ref 8.9–10.3)
Chloride: 100 mmol/L (ref 98–111)
Creatinine, Ser: 1.17 mg/dL — ABNORMAL HIGH (ref 0.44–1.00)
GFR, Estimated: 48 mL/min — ABNORMAL LOW (ref >60)
Glucose, Bld: 126 mg/dL — ABNORMAL HIGH (ref 70–99)
Potassium: 3.7 mmol/L (ref 3.5–5.1)
Sodium: 140 mmol/L (ref 135–145)
Total Bilirubin: 0.7 mg/dL (ref 0.0–1.2)
Total Protein: 8.5 g/dL — ABNORMAL HIGH (ref 6.5–8.1)

## 2023-05-12 LAB — CBC
HCT: 51.9 % — ABNORMAL HIGH (ref 36.0–46.0)
Hemoglobin: 17.1 g/dL — ABNORMAL HIGH (ref 12.0–15.0)
MCH: 31.2 pg (ref 26.0–34.0)
MCHC: 32.9 g/dL (ref 30.0–36.0)
MCV: 94.7 fL (ref 80.0–100.0)
Platelets: 308 10*3/uL (ref 150–400)
RBC: 5.48 MIL/uL — ABNORMAL HIGH (ref 3.87–5.11)
RDW: 12.6 % (ref 11.5–15.5)
WBC: 14.7 10*3/uL — ABNORMAL HIGH (ref 4.0–10.5)
nRBC: 0 % (ref 0.0–0.2)

## 2023-05-12 LAB — GLUCOSE, CAPILLARY: Glucose-Capillary: 128 mg/dL — ABNORMAL HIGH (ref 70–99)

## 2023-05-12 LAB — RESP PANEL BY RT-PCR (RSV, FLU A&B, COVID)  RVPGX2
Influenza A by PCR: NEGATIVE
Influenza B by PCR: NEGATIVE
Resp Syncytial Virus by PCR: NEGATIVE
SARS Coronavirus 2 by RT PCR: NEGATIVE

## 2023-05-12 LAB — OCCULT BLOOD X 1 CARD TO LAB, STOOL: Fecal Occult Bld: NEGATIVE

## 2023-05-12 LAB — LIPASE, BLOOD: Lipase: 23 U/L (ref 11–51)

## 2023-05-12 MED ORDER — SENNOSIDES-DOCUSATE SODIUM 8.6-50 MG PO TABS: 1.0000 | ORAL_TABLET | Freq: Every evening | ORAL | Status: DC | PRN

## 2023-05-12 MED ORDER — HYDROMORPHONE HCL 1 MG/ML IJ SOLN: 0.5000 mg | INTRAMUSCULAR | Status: DC | PRN

## 2023-05-12 MED ORDER — SODIUM CHLORIDE 0.9 % IV BOLUS
500.0000 mL | Freq: Once | INTRAVENOUS | Status: AC
  Administered 2023-05-12: 500 mL via INTRAVENOUS

## 2023-05-12 MED ORDER — LEVOTHYROXINE SODIUM 112 MCG PO TABS
112.0000 ug | ORAL_TABLET | Freq: Every day | ORAL | Status: DC
  Administered 2023-05-13 – 2023-05-14 (×2): 112 ug via ORAL
  Filled 2023-05-12 (×2): qty 1

## 2023-05-12 MED ORDER — METOCLOPRAMIDE HCL 5 MG/ML IJ SOLN
10.0000 mg | Freq: Three times a day (TID) | INTRAMUSCULAR | Status: DC
  Administered 2023-05-12 – 2023-05-14 (×5): 10 mg via INTRAVENOUS
  Filled 2023-05-12 (×5): qty 2

## 2023-05-12 MED ORDER — ROSUVASTATIN CALCIUM 10 MG PO TABS
10.0000 mg | ORAL_TABLET | Freq: Every day | ORAL | Status: DC
  Administered 2023-05-13 – 2023-05-14 (×2): 10 mg via ORAL
  Filled 2023-05-12 (×2): qty 1

## 2023-05-12 MED ORDER — HYDROMORPHONE HCL 1 MG/ML IJ SOLN
0.5000 mg | Freq: Once | INTRAMUSCULAR | Status: AC
  Administered 2023-05-12: 0.5 mg via INTRAVENOUS
  Filled 2023-05-12: qty 1

## 2023-05-12 MED ORDER — ACETAMINOPHEN 650 MG RE SUPP: 650.0000 mg | Freq: Four times a day (QID) | RECTAL | Status: DC | PRN

## 2023-05-12 MED ORDER — ONDANSETRON HCL 4 MG/2ML IJ SOLN
4.0000 mg | Freq: Once | INTRAMUSCULAR | Status: AC
  Administered 2023-05-12: 4 mg via INTRAVENOUS
  Filled 2023-05-12: qty 2

## 2023-05-12 MED ORDER — VENLAFAXINE HCL ER 150 MG PO CP24
150.0000 mg | ORAL_CAPSULE | Freq: Every day | ORAL | Status: DC
  Administered 2023-05-13 – 2023-05-14 (×2): 150 mg via ORAL
  Filled 2023-05-12 (×2): qty 1

## 2023-05-12 MED ORDER — LACTATED RINGERS IV BOLUS: 1000.0000 mL | Freq: Once | INTRAVENOUS | Status: DC

## 2023-05-12 MED ORDER — AMLODIPINE BESYLATE 5 MG PO TABS
5.0000 mg | ORAL_TABLET | Freq: Every day | ORAL | Status: DC
  Administered 2023-05-13 – 2023-05-14 (×2): 5 mg via ORAL
  Filled 2023-05-12 (×2): qty 1

## 2023-05-12 MED ORDER — ONDANSETRON HCL 4 MG/2ML IJ SOLN
4.0000 mg | Freq: Four times a day (QID) | INTRAMUSCULAR | Status: DC | PRN
  Administered 2023-05-13 (×3): 4 mg via INTRAVENOUS
  Filled 2023-05-12 (×3): qty 2

## 2023-05-12 MED ORDER — PANTOPRAZOLE SODIUM 40 MG IV SOLR
80.0000 mg | Freq: Once | INTRAVENOUS | Status: AC
  Administered 2023-05-12: 80 mg via INTRAVENOUS
  Filled 2023-05-12: qty 20

## 2023-05-12 MED ORDER — IOHEXOL 300 MG/ML  SOLN
100.0000 mL | Freq: Once | INTRAMUSCULAR | Status: AC | PRN
  Administered 2023-05-12: 100 mL via INTRAVENOUS

## 2023-05-12 MED ORDER — ACETAMINOPHEN 325 MG PO TABS: 650.0000 mg | ORAL_TABLET | Freq: Four times a day (QID) | ORAL | Status: DC | PRN

## 2023-05-12 MED ORDER — PANTOPRAZOLE SODIUM 40 MG IV SOLR
40.0000 mg | Freq: Two times a day (BID) | INTRAVENOUS | Status: DC
  Administered 2023-05-12 – 2023-05-14 (×4): 40 mg via INTRAVENOUS
  Filled 2023-05-12 (×4): qty 10

## 2023-05-12 MED ORDER — SODIUM CHLORIDE 0.9% FLUSH
3.0000 mL | Freq: Two times a day (BID) | INTRAVENOUS | Status: DC
  Administered 2023-05-12 – 2023-05-14 (×4): 3 mL via INTRAVENOUS

## 2023-05-12 MED ORDER — MONTELUKAST SODIUM 10 MG PO TABS
10.0000 mg | ORAL_TABLET | Freq: Every day | ORAL | Status: DC
  Administered 2023-05-12 – 2023-05-13 (×2): 10 mg via ORAL
  Filled 2023-05-12 (×2): qty 1

## 2023-05-12 MED ORDER — PIRFENIDONE 801 MG PO TABS: 801.0000 mg | ORAL_TABLET | Freq: Three times a day (TID) | ORAL | Status: DC

## 2023-05-12 MED ORDER — CARVEDILOL 25 MG PO TABS
25.0000 mg | ORAL_TABLET | Freq: Two times a day (BID) | ORAL | Status: DC
  Administered 2023-05-12 – 2023-05-14 (×4): 25 mg via ORAL
  Filled 2023-05-12 (×4): qty 1

## 2023-05-12 MED ORDER — INSULIN ASPART 100 UNIT/ML IJ SOLN
0.0000 [IU] | Freq: Three times a day (TID) | INTRAMUSCULAR | Status: DC
  Administered 2023-05-13 (×2): 2 [IU] via SUBCUTANEOUS

## 2023-05-12 NOTE — ED Provider Notes (Signed)
  Physical Exam  BP 139/68   Pulse 80   Temp 98.1 F (36.7 C)   Resp 16   SpO2 98%   Physical Exam  Procedures  Procedures  ED Course / MDM   Clinical Course as of 05/12/23 1636  Fri May 12, 2023  1533 Assumed care. 78 yo F with hx of  who presented with vomiting yesterday and spitting up bright red blood yesterday. Did have coffee ground emesis. Hgb ok. Vitals stable. CT abdomen pelvis ok. Awaiting call back from hospitalist.  [RP]  1548 Dr Wendelyn Breslow from hospitalist to admit the patient. [RP]    Clinical Course User Index [RP] Rondel Baton, MD   Medical Decision Making Amount and/or Complexity of Data Reviewed Labs: ordered. Radiology: ordered.  Risk Prescription drug management. Decision regarding hospitalization.      Rondel Baton, MD 05/12/23 [phone]

## 2023-05-12 NOTE — ED Provider Notes (Signed)
 [place] EMERGENCY DEPARTMENT AT [facility] Provider Note   CSN: [id number] Arrival date & time: 05/12/23  1027     History  Chief Complaint  Patient presents with   Hematemesis   Abdominal Pain    Susan Hunt is a 78 y.o. female.  Patient is a 78 year old female with a history of hypertension, hypothyroidism, hyperlipidemia, COPD, GERD, CHF, pulmonary fibrosis, diabetes who presents with nausea and vomiting.  She has had a cough for about 3 to 4 days.  She has had a little bit of increased shortness of breath.  She is on home oxygen at 3 L/min.  Nausea and vomiting early in the morning hours.  It was nonbilious and nonbloody.  She did okay through the day although she laid in bed most of the day.  She started having some abdominal pain after the vomiting.  Last night throughout the night she was spitting up some blood.  She said it was red blood.  Then this morning she vomited some coffee-ground looking emesis.  She has had some ongoing dizziness.  No fevers.  No melena or noticeable blood in her stool.       Home Medications Prior to Admission medications   Medication Sig Start Date End Date Taking? Authorizing Provider  amLODipine (NORVASC) 5 MG tablet Take 1 tablet (5 mg total) by mouth daily. 11/07/22   Parke Poisson, MD  carvedilol (COREG) 25 MG tablet Take 1 tablet (25 mg total) by mouth 2 (two) times daily. 01/31/23   Parke Poisson, MD  cetirizine (ZYRTEC) 10 MG tablet Take 10 mg by mouth daily.    [provider]  FARXIGA 10 MG TABS tablet Take 1 tablet (10 mg total) by mouth daily. 05/04/23   Parke Poisson, MD  levothyroxine (SYNTHROID) 112 MCG tablet Take 1 tablet (112 mcg total) by mouth daily. 01/13/23   Willow Ora, MD  montelukast (SINGULAIR) 10 MG tablet TAKE 1 TABLET BY MOUTH EVERY NIGHT AT BEDTIME 01/30/23   Willow Ora, MD  Multiple Vitamin (MULTIVITAMIN WITH MINERALS) TABS tablet Take 1 tablet by mouth daily.     [provider]  omeprazole (PRILOSEC) 20 MG capsule TAKE ONE CAPSULE BY MOUTH DAILY ON AN EMPTY STOMACH 01/09/23   Willow Ora, MD  ondansetron (ZOFRAN-ODT) 4 MG disintegrating tablet DISSOLVE ONE TABLET BY MOUTH EVERY 8 HOURS AS NEEDED FOR NAUSEA/ VOMITING 03/20/20   Willow Ora, MD  Pirfenidone (ESBRIET) 801 MG TABS Take 1 tablet (801 mg total) by mouth with breakfast, with lunch, and with evening meal. 03/31/23   Kalman Shan, MD  rosuvastatin (CRESTOR) 10 MG tablet TAKE 1 TABLET BY MOUTH DAILY 04/05/23   Willow Ora, MD  Semaglutide, 2 MG/DOSE, (OZEMPIC, 2 MG/DOSE,) 8 MG/3ML SOPN Inject 2 mg into the skin once a week. 05/03/23   Parke Poisson, MD  spironolactone (ALDACTONE) 25 MG tablet Take 1 tablet (25 mg total) by mouth daily. 02/02/23   Parke Poisson, MD  valsartan (DIOVAN) 320 MG tablet Take 1 tablet (320 mg total) by mouth daily. 07/08/22   Parke Poisson, MD  venlafaxine XR (EFFEXOR-XR) 150 MG 24 hr capsule Take 1 capsule (150 mg total) by mouth daily. 01/10/23   Willow Ora, MD      Allergies    Ace inhibitors, Clarithromycin, Codeine, Sulfamethoxazole, and Telmisartan    Review of Systems   Review of Systems  Constitutional:  Positive for fatigue. Negative for chills,  diaphoresis and fever.  HENT:  Negative for congestion, rhinorrhea and sneezing.   Eyes: Negative.   Respiratory:  Positive for cough. Negative for chest tightness and shortness of breath.   Cardiovascular:  Negative for chest pain and leg swelling.  Gastrointestinal:  Positive for abdominal pain, nausea and vomiting. Negative for blood in stool and diarrhea.  Genitourinary:  Negative for difficulty urinating, flank pain, frequency and hematuria.  Musculoskeletal:  Negative for arthralgias and back pain.  Skin:  Negative for rash.  Neurological:  Negative for dizziness, speech difficulty, weakness, numbness and headaches.    Physical Exam Updated Vital Signs BP 139/68    Pulse 80   Temp 98.1 F (36.7 C)   Resp 16   SpO2 98%  Physical Exam Constitutional:      Appearance: She is well-developed.  HENT:     Head: Normocephalic and atraumatic.  Eyes:     Pupils: Pupils are equal, round, and reactive to light.  Cardiovascular:     Rate and Rhythm: Normal rate and regular rhythm.     Heart sounds: Normal heart sounds.  Pulmonary:     Effort: Pulmonary effort is normal. No respiratory distress.     Breath sounds: Normal breath sounds. No wheezing or rales.  Chest:     Chest wall: No tenderness.  Abdominal:     General: Bowel sounds are normal.     Palpations: Abdomen is soft.     Tenderness: There is abdominal tenderness in the epigastric area. There is no guarding or rebound.  Genitourinary:    Comments: Rectal exam shows soft brown stool Musculoskeletal:        General: Normal range of motion.     Cervical back: Normal range of motion and neck supple.  Lymphadenopathy:     Cervical: No cervical adenopathy.  Skin:    General: Skin is warm and dry.     Findings: No rash.  Neurological:     Mental Status: She is alert and oriented to person, place, and time.     ED Results / Procedures / Treatments   Labs (all labs ordered are listed, but only abnormal results are displayed) Labs Reviewed  COMPREHENSIVE METABOLIC PANEL - Abnormal; Notable for the following components:      Result Value   Glucose, Bld 126 (*)    Creatinine, Ser 1.17 (*)    Calcium 10.5 (*)    Total Protein 8.5 (*)    GFR, Estimated 48 (*)    All other components within normal limits  CBC - Abnormal; Notable for the following components:   WBC 14.7 (*)    RBC 5.48 (*)    Hemoglobin 17.1 (*)    HCT 51.9 (*)    All other components within normal limits  RESP PANEL BY RT-PCR (RSV, FLU A&B, COVID)  [id number]  OCCULT BLOOD X 1 CARD TO LAB, STOOL  POC OCCULT BLOOD, ED    EKG None  Radiology DG Chest 2 View Result Date: 05/12/2023 CLINICAL DATA:  Cough, vomiting. EXAM:  CHEST - 2 VIEW COMPARISON:  January 04, 2021. FINDINGS: Mild cardiomegaly is noted. Mild central pulmonary vascular congestion is noted with possible bilateral pulmonary edema. Elevated right hemidiaphragm. Bony thorax is unremarkable. IMPRESSION: Mild cardiomegaly is noted with mild central pulmonary vascular congestion and possible bilateral pulmonary edema. Electronically Signed   By: Lupita Raider M.D.   On: 05/12/2023 14:50   CT ABDOMEN PELVIS W CONTRAST Result Date: 05/12/2023 CLINICAL DATA:  Acute generalized  abdominal pain, hematemesis. EXAM: CT ABDOMEN AND PELVIS WITH CONTRAST TECHNIQUE: Multidetector CT imaging of the abdomen and pelvis was performed using the standard protocol following bolus administration of intravenous contrast. RADIATION DOSE REDUCTION: This exam was performed according to the departmental dose-optimization program which includes automated exposure control, adjustment of the mA and/or kV according to patient size and/or use of iterative reconstruction technique. CONTRAST:  81 mL OMNIPAQUE IOHEXOL 300 MG/ML  SOLN COMPARISON:  December 06, 2018. FINDINGS: Lower chest: Probable minimal bibasilar scarring is noted. Hepatobiliary: Stable hepatic cysts. No gallstones, gallbladder wall thickening, or biliary dilatation. Pancreas: Unremarkable. No pancreatic ductal dilatation or surrounding inflammatory changes. Spleen: Normal in size without focal abnormality. Adrenals/Urinary Tract: Adrenal glands appear normal. Right renal cyst is noted for which no further follow-up is required. No hydronephrosis or renal obstruction is noted. Urinary bladder is unremarkable. Stomach/Bowel: Small sliding-type hiatal hernia. No evidence of bowel obstruction or inflammation. The appendix appears normal. Vascular/Lymphatic: Aortic atherosclerosis. No enlarged abdominal or pelvic lymph nodes. Reproductive: Status post hysterectomy. No adnexal masses. Other: No abdominal wall hernia or abnormality. No  abdominopelvic ascites. Musculoskeletal: No acute or significant osseous findings. IMPRESSION: Small sliding-type hiatal hernia. No other acute abnormality seen in the abdomen or pelvis. Aortic Atherosclerosis (ICD10-I70.0). Electronically Signed   By: Lupita Raider M.D.   On: 05/12/2023 14:48    Procedures Procedures    Medications Ordered in ED Medications  sodium chloride 0.9 % bolus 500 mL (has no administration in time range)  pantoprazole (PROTONIX) injection 80 mg (80 mg Intravenous Given 05/12/23 1305)  iohexol (OMNIPAQUE) 300 MG/ML solution 100 mL (100 mLs Intravenous Contrast Given 05/12/23 1313)    ED Course/ Medical Decision Making/ A&P Clinical Course as of 05/12/23 1550  Fri May 12, 2023  1533 Assumed care. 78 yo F with hx of  who presented with vomiting yesterday and spitting up bright red blood yesterday. Did have coffee ground emesis. Hgb ok. Vitals stable. CT abdomen pelvis ok. Awaiting call back from hospitalist.  [RP]  1548 Dr Wendelyn Breslow from hospitalist.  [RP]    Clinical Course User Index [RP] Rondel Baton, MD                                 Medical Decision Making Amount and/or Complexity of Data Reviewed Labs: ordered. Radiology: ordered.  Risk Prescription drug management.   Patient presents with nausea and vomiting.  Today she has had hematemesis.  Labs show a stable hemoglobin.  She is afebrile.  CT scan does not show any acute abnormality.  No evidence of perforation.  She was given IV Protonix.  Given her symptoms, feel that be better to admit for observation.  Awaiting hospitalist call.  Dr. Eloise Harman to take call.  Final Clinical Impression(s) / ED Diagnoses Final diagnoses:  Hematemesis with nausea    Rx / DC Orders ED Discharge Orders     None         Rolan Bucco, MD 05/12/23 1551

## 2023-05-12 NOTE — H&P (Signed)
 History and Physical    JANECE LAIDLAW BJY:[id number] DOB: [birth year] DOA: 05/12/2023  PCP: Willow Ora, MD  Patient coming from: Home  I have personally briefly reviewed patient's old medical records in [facility]  Chief Complaint: Nausea and vomiting  HPI: Susan Hunt is a 78 y.o. female with medical history significant for chronic HFrEF s/p meropenem, idiopathic pulmonary fibrosis, T2DM, HTN, HLD, hypothyroidism who presented to the ED for evaluation of nausea and vomiting.  Patient states that she and her husband have both recently had cold symptoms.  She had a hoarse cough a few days ago.  2 days ago she had some loose stools which has since resolved.  Yesterday she developed nausea with frequent vomiting.  Initially emesis was nonbloody however later became blood-tinged.  Today she has had ongoing nausea and vomiting, emesis has been brown.  She has had some abdominal pain associated with this.  She denies taking any blood thinners including aspirin.  She uses ibuprofen on occasion.  She denies any similar episodes in the past.  She had been on [facility] for a few months and seems to be tolerating it well however her insurance stopped paying for it so she was recently switched to Ozempic.  She started taking Ozempic last week with most recent dose yesterday.  [facility] ED Course  Labs/Imaging on admission: I have personally reviewed following labs and imaging studies.  Initial vitals showed BP 120/69, pulse 81, RR 18, temp 98.1 F, SpO2 96% on room air.  Labs showed WBC 14.7, hemoglobin 17.1, platelets 308,000, sodium 140, potassium 3.7, bicarb 27, BUN 19, creatinine 1.17, serum glucose 126, LFTs within normal limits.  SARS-CoV-2, influenza, RSV PCR negative.  FOBT negative.  Lipase 23.  CT abdomen/pelvis with contrast showed small sliding-type hiatal hernia without other acute abnormality.  2 view chest x-ray showed mild cardiomegaly and central  pulmonary vascular congestion.  Patient was given 500 cc normal saline, IV Protonix 80 mg, IV Dilaudid 0.5 mg.  The hospitalist service was consulted to admit.  Review of Systems: All systems reviewed and are negative except as documented in history of present illness above.   Past Medical History:  Diagnosis Date   Anemia    Asthma    "I dont think I have it.   CHF (congestive heart failure) (HCC)    Depression    Diabetes mellitus without complication (HCC)    type 2   Emphysema of lung (HCC)    GERD (gastroesophageal reflux disease)    Hemorrhoids    History of kidney stones    Cystoscopy   Hyperlipidemia    Hypertension    Hypothyroidism    Obesity    Pulmonary fibrosis (HCC)    Rosacea    Sleep apnea    mild, no need for CPAP   Tubular adenoma of colon 03/26/2019   Colonoscopy repeat in 3 years    Past Surgical History:  Procedure Laterality Date   ABDOMINAL HYSTERECTOMY     ANTERIOR AND POSTERIOR VAGINAL REPAIR  2020   CARDIAC CATHETERIZATION  11/01/2019   CATARACT EXTRACTION W/ INTRAOCULAR LENS IMPLANT Bilateral 2016   COLONOSCOPY     CYSTOSCOPY     DENTAL SURGERY  2016   Dental Implant   FRACTURE SURGERY Left 2013   Left ankle   INTERCOSTAL NERVE BLOCK Right 12/13/2019   Procedure: INTERCOSTAL NERVE BLOCK;  Surgeon: Loreli Slot, MD;  Location: [facility] OR;  Service: Thoracic;  Laterality: Right;  LUNG BIOPSY Right 12/13/2019   Procedure: LUNG BIOPSY;  Surgeon: Loreli Slot, MD;  Location: [facility] OR;  Service: Thoracic;  Laterality: Right;   LYMPH NODE BIOPSY Right 12/13/2019   Procedure: LYMPH NODE BIOPSY;  Surgeon: Loreli Slot, MD;  Location: [facility] OR;  Service: Thoracic;  Laterality: Right;   medtronix nerve stimulator  07/18/2019   Complete InterStim Sacral Neuromudulation system implantation 07/18/19, [facility]   NASAL SINUS SURGERY     RIGHT/LEFT HEART CATH AND CORONARY ANGIOGRAPHY N/A 11/01/2019   Procedure: RIGHT/LEFT HEART CATH AND  CORONARY ANGIOGRAPHY;  Surgeon: [place], Peter M, MD;  Location: [facility] INVASIVE CV LAB;  Service: Cardiovascular;  Laterality: N/A;   VAGINECTOMY, PARTIAL     VIDEO BRONCHOSCOPY N/A 12/13/2019   Procedure: VIDEO BRONCHOSCOPY;  Surgeon: Loreli Slot, MD;  Location: [facility] OR;  Service: Thoracic;  Laterality: N/A;    Social History:  reports that she quit smoking about 9 years ago. Her smoking use included cigarettes. She started smoking about 24 years ago. She has a 7.5 pack-year smoking history. She has never used smokeless tobacco. She reports that she does not currently use alcohol. She reports that she does not use drugs.  Allergies  Allergen Reactions   Ace Inhibitors Cough   Clarithromycin     REACTION: GI sx   Codeine Nausea Only   Sulfamethoxazole Nausea Only   Telmisartan Other (See Comments)    sleepy    Family History  Problem Relation Age of Onset   Heart disease Mother    Hypertension Mother    Osteoporosis Mother    Stroke Mother    Alcohol abuse Father    Diabetes Maternal Grandmother    Drug abuse Son    Breast cancer Neg Hx      Prior to Admission medications   Medication Sig Start Date End Date Taking? Authorizing Provider  amLODipine (NORVASC) 5 MG tablet Take 1 tablet (5 mg total) by mouth daily. 11/07/22   Parke Poisson, MD  carvedilol (COREG) 25 MG tablet Take 1 tablet (25 mg total) by mouth 2 (two) times daily. 01/31/23   Parke Poisson, MD  cetirizine (ZYRTEC) 10 MG tablet Take 10 mg by mouth daily.    [provider]  FARXIGA 10 MG TABS tablet Take 1 tablet (10 mg total) by mouth daily. 05/04/23   Parke Poisson, MD  levothyroxine (SYNTHROID) 112 MCG tablet Take 1 tablet (112 mcg total) by mouth daily. 01/13/23   Willow Ora, MD  montelukast (SINGULAIR) 10 MG tablet TAKE 1 TABLET BY MOUTH EVERY NIGHT AT BEDTIME 01/30/23   Willow Ora, MD  Multiple Vitamin (MULTIVITAMIN WITH MINERALS) TABS tablet Take 1 tablet by mouth daily.     [provider]  omeprazole (PRILOSEC) 20 MG capsule TAKE ONE CAPSULE BY MOUTH DAILY ON AN EMPTY STOMACH 01/09/23   Willow Ora, MD  ondansetron (ZOFRAN-ODT) 4 MG disintegrating tablet DISSOLVE ONE TABLET BY MOUTH EVERY 8 HOURS AS NEEDED FOR NAUSEA/ VOMITING 03/20/20   Willow Ora, MD  Pirfenidone (ESBRIET) 801 MG TABS Take 1 tablet (801 mg total) by mouth with breakfast, with lunch, and with evening meal. 03/31/23   Kalman Shan, MD  rosuvastatin (CRESTOR) 10 MG tablet TAKE 1 TABLET BY MOUTH DAILY 04/05/23   Willow Ora, MD  Semaglutide, 2 MG/DOSE, (OZEMPIC, 2 MG/DOSE,) 8 MG/3ML SOPN Inject 2 mg into the skin once a week. 05/03/23   Parke Poisson, MD  spironolactone (ALDACTONE)  25 MG tablet Take 1 tablet (25 mg total) by mouth daily. 02/02/23   Parke Poisson, MD  valsartan (DIOVAN) 320 MG tablet Take 1 tablet (320 mg total) by mouth daily. 07/08/22   Parke Poisson, MD  venlafaxine XR (EFFEXOR-XR) 150 MG 24 hr capsule Take 1 capsule (150 mg total) by mouth daily. 01/10/23   Willow Ora, MD    Physical Exam: Vitals:   05/12/23 1830 05/12/23 1946 05/12/23 1948 05/12/23 1949  BP: (!) 167/63  (!) 168/111 (!) 158/99  Pulse: 78  76 80  Resp: 18  18 19   Temp:   97.9 F (36.6 C)   TempSrc:   Oral   SpO2: 100%  92% 92%  Weight:  81.9 kg    Height:  5\' 8"  (1.727 m)     Constitutional: Resting in bed, appears fatigued but in NAD, calm, comfortable Eyes: EOMI, lids and conjunctivae normal ENMT: Mucous membranes are moist. Posterior pharynx clear of any exudate or lesions.Normal dentition.  Neck: normal, supple, no masses. Respiratory: clear to auscultation bilaterally, no wheezing, no crackles. Normal respiratory effort. No accessory muscle use.  Cardiovascular: Regular rate and rhythm, no murmurs / rubs / gallops. No extremity edema. 2+ pedal pulses. Abdomen: no tenderness, no masses palpated.  Musculoskeletal: no clubbing / cyanosis. No joint deformity  upper and lower extremities. Good ROM, no contractures. Normal muscle tone.  Skin: no rashes, lesions, ulcers. No induration Neurologic: Sensation intact. Strength 5/5 in all 4.  Psychiatric: Normal judgment and insight. Alert and oriented x 3. Normal mood.   EKG: Not performed. Assessment/Plan Principal Problem:   Intractable vomiting Active Problems:   Acquired hypothyroidism   Mixed hyperlipidemia   Hypertension associated with diabetes (HCC)   Controlled type 2 diabetes mellitus without complication, without long-term current use of insulin (HCC)   ILD (interstitial lung disease) (HCC)   HFrEF (heart failure with reduced ejection fraction) (HCC)   Susan Hunt is a 78 y.o. female with medical history significant for chronic HFrEF s/p meropenem, idiopathic pulmonary fibrosis, T2DM, HTN, HLD, hypothyroidism who is admitted with intractable nausea and vomiting.  Assessment and Plan: Intractable nausea and vomiting: Patient with 2 days intractable nausea and vomiting with subsequent blood-tinged emesis.  Recently started on Ozempic after her insurance stopped paying for [facility].  Ozempic potentially triggering her GI symptoms with likely some Mallory-Weiss tearing due to frequent retching/emesis.  She is not on any blood thinners.  No prior EGD.  CT shows small sliding-type hiatal hernia without other acute pathology.  Hemoglobin is 17.1. -Start IV Reglan 10 mg every 8 hours -IV Zofran as needed for breakthrough nausea/vomiting -Continue IV Protonix 40 mg twice daily -Repeat CBC in a.m.  Chronic HFrEF: Last EF 35-40% by TTE on 10/17/2022.  Appears euvolemic on admission but at risk of hypovolemia given ongoing emesis and poor oral intake. -Continue Coreg 25 mg twice daily -Holding valsartan, spironolactone, Farxiga for now  Idiopathic pulmonary fibrosis: Stable.  Uses supplemental O2 as needed with exertion at home.  Continue Singulair and pirfenidone.  Type 2 diabetes: Holding  [place] and Ozempic.  Placed on SSI.  Hypertension: Continue Coreg and amlodipine.  Holding valsartan spironolactone.  Hypothyroidism: Synthroid.  Hyperlipidemia: Continue rosuvastatin.  Depression: Continue venlafaxine XR.   DVT prophylaxis: SCDs Start: 05/12/23 2036 Code Status:   Code Status: Do not attempt resuscitation (DNR) PRE-ARREST INTERVENTIONS DESIRED discussed with patient and her husband on admission. Family Communication: Husband at bedside Disposition Plan: From home and  likely discharge to home pending clinical progress Consults called: None Severity of Illness: The appropriate patient status for this patient is OBSERVATION. Observation status is judged to be reasonable and necessary in order to provide the required intensity of service to ensure the patient's safety. The patient's presenting symptoms, physical exam findings, and initial radiographic and laboratory data in the context of their medical condition is felt to place them at decreased risk for further clinical deterioration. Furthermore, it is anticipated that the patient will be medically stable for discharge from the hospital within 2 midnights of admission.   Darreld Mclean MD [facility]  If 7PM-7AM, please contact night-coverage [email or web address]  05/12/2023, 8:50 PM

## 2023-05-12 NOTE — Hospital Course (Signed)
 Susan Hunt is a 78 y.o. female with medical history significant for chronic HFrEF s/p meropenem, idiopathic pulmonary fibrosis, T2DM, HTN, HLD, hypothyroidism who is admitted with intractable nausea and vomiting.

## 2023-05-12 NOTE — ED Triage Notes (Addendum)
 Pt is here due to vomiting 7-8 times since yesterday. Pt reports that after a few episodes she began vomiting blood.  Pt has had abdominal pain with this as well as severe headache.

## 2023-05-12 NOTE — ED Notes (Signed)
 Report given to [facility].

## 2023-05-12 NOTE — ED Notes (Signed)
 RT asked to assess pt that was having sats in the upper 80s.  Pt stated she wears O2 at home as needed, 3-4L.  Pt placed on 3L [address].  Sats are 93-94%.  BBS clear, slightly coarse.  PT stated she has ILD.

## 2023-05-12 NOTE — ED Notes (Signed)
 Report to given the floor RN.Marland KitchenMarland Kitchen

## 2023-05-12 NOTE — ED Notes (Signed)
 [facility] called.

## 2023-05-13 DIAGNOSIS — R1110 Vomiting, unspecified: Secondary | ICD-10-CM

## 2023-05-13 LAB — CBC
HCT: 50.8 % — ABNORMAL HIGH (ref 36.0–46.0)
Hemoglobin: 16.2 g/dL — ABNORMAL HIGH (ref 12.0–15.0)
MCH: 30.6 pg (ref 26.0–34.0)
MCHC: 31.9 g/dL (ref 30.0–36.0)
MCV: 96 fL (ref 80.0–100.0)
Platelets: 271 10*3/uL (ref 150–400)
RBC: 5.29 MIL/uL — ABNORMAL HIGH (ref 3.87–5.11)
RDW: 12.6 % (ref 11.5–15.5)
WBC: 17.6 10*3/uL — ABNORMAL HIGH (ref 4.0–10.5)
nRBC: 0 % (ref 0.0–0.2)

## 2023-05-13 LAB — BASIC METABOLIC PANEL
Anion gap: 14 (ref 5–15)
BUN: 31 mg/dL — ABNORMAL HIGH (ref 8–23)
CO2: 25 mmol/L (ref 22–32)
Calcium: 9.3 mg/dL (ref 8.9–10.3)
Chloride: 101 mmol/L (ref 98–111)
Creatinine, Ser: 0.83 mg/dL (ref 0.44–1.00)
GFR, Estimated: >60 mL/min (ref >60)
Glucose, Bld: 158 mg/dL — ABNORMAL HIGH (ref 70–99)
Potassium: 4.1 mmol/L (ref 3.5–5.1)
Sodium: 140 mmol/L (ref 135–145)

## 2023-05-13 LAB — GLUCOSE, CAPILLARY
Glucose-Capillary: 167 mg/dL — ABNORMAL HIGH (ref 70–99)
Glucose-Capillary: 166 mg/dL — ABNORMAL HIGH (ref 70–99)
Glucose-Capillary: 117 mg/dL — ABNORMAL HIGH (ref 70–99)
Glucose-Capillary: 104 mg/dL — ABNORMAL HIGH (ref 70–99)

## 2023-05-13 MED ORDER — SPIRONOLACTONE 25 MG PO TABS
25.0000 mg | ORAL_TABLET | Freq: Every day | ORAL | Status: DC
  Administered 2023-05-13 – 2023-05-14 (×2): 25 mg via ORAL
  Filled 2023-05-13 (×2): qty 1

## 2023-05-13 MED ORDER — LOPERAMIDE HCL 2 MG PO CAPS
2.0000 mg | ORAL_CAPSULE | Freq: Once | ORAL | Status: AC
  Administered 2023-05-13: 2 mg via ORAL
  Filled 2023-05-13: qty 1

## 2023-05-13 MED ORDER — ENOXAPARIN SODIUM 40 MG/0.4ML IJ SOSY
40.0000 mg | PREFILLED_SYRINGE | INTRAMUSCULAR | Status: DC
  Administered 2023-05-13: 40 mg via SUBCUTANEOUS
  Filled 2023-05-13: qty 0.4

## 2023-05-13 MED ORDER — SODIUM CHLORIDE 0.9 % IV SOLN
INTRAVENOUS | Status: DC
Start: 2023-05-13 — End: 2023-05-13

## 2023-05-13 MED ORDER — SODIUM CHLORIDE 0.9 % IV SOLN: INTRAVENOUS | Status: AC

## 2023-05-13 NOTE — Plan of Care (Signed)
 Pt states that reglan seems to be helping with vomiting. Problem: Education: Goal: Knowledge of General Education information will improve Description: Including pain rating scale, medication(s)/side effects and non-pharmacologic comfort measures Outcome: Progressing   Problem: Health Behavior/Discharge Planning: Goal: Ability to manage health-related needs will improve Outcome: Progressing   Problem: Clinical Measurements: Goal: Ability to maintain clinical measurements within normal limits will improve Outcome: Progressing Goal: Will remain free from infection Outcome: Progressing Goal: Diagnostic test results will improve Outcome: Progressing Goal: Respiratory complications will improve Outcome: Progressing Goal: Cardiovascular complication will be avoided Outcome: Progressing   Problem: Activity: Goal: Risk for activity intolerance will decrease Outcome: Progressing   Problem: Nutrition: Goal: Adequate nutrition will be maintained Outcome: Progressing   Problem: Coping: Goal: Level of anxiety will decrease Outcome: Progressing   Problem: Elimination: Goal: Will not experience complications related to bowel motility Outcome: Progressing Goal: Will not experience complications related to urinary retention Outcome: Progressing   Problem: Pain Managment: Goal: General experience of comfort will improve and/or be controlled Outcome: Progressing   Problem: Safety: Goal: Ability to remain free from injury will improve Outcome: Progressing   Problem: Skin Integrity: Goal: Risk for impaired skin integrity will decrease Outcome: Progressing   Problem: Education: Goal: Ability to describe self-care measures that may prevent or decrease complications (Diabetes Survival Skills Education) will improve Outcome: Progressing Goal: Individualized Educational Video(s) Outcome: Progressing   Problem: Coping: Goal: Ability to adjust to condition or change in health will  improve Outcome: Progressing   Problem: Fluid Volume: Goal: Ability to maintain a balanced intake and output will improve Outcome: Progressing   Problem: Health Behavior/Discharge Planning: Goal: Ability to identify and utilize available resources and services will improve Outcome: Progressing Goal: Ability to manage health-related needs will improve Outcome: Progressing   Problem: Metabolic: Goal: Ability to maintain appropriate glucose levels will improve Outcome: Progressing   Problem: Nutritional: Goal: Maintenance of adequate nutrition will improve Outcome: Progressing Goal: Progress toward achieving an optimal weight will improve Outcome: Progressing   Problem: Skin Integrity: Goal: Risk for impaired skin integrity will decrease Outcome: Progressing   Problem: Tissue Perfusion: Goal: Adequacy of tissue perfusion will improve Outcome: Progressing

## 2023-05-13 NOTE — TOC Progression Note (Addendum)
 Transition of Care [facility]) - Progression Note    Patient Details  Name: Susan Hunt MRN: [id number] Date of Birth: 1946-01-13  Transition of Care [facility]) CM/SW Contact  Adrian Prows, RN Phone Number: 05/13/2023, 5:48 PM  Clinical Narrative:    Pt asleep; no family at bedside; LVM for pt's husband Susan Hunt ([phone]); awaiting return call; unable to deliver MOON.        Expected Discharge Plan and Services                                               Social Determinants of Health (SDOH) Interventions SDOH Screenings   Food Insecurity: No Food Insecurity (05/13/2023)  Housing: Low Risk  (05/13/2023)  Transportation Needs: No Transportation Needs (05/13/2023)  Utilities: Not At Risk (05/13/2023)  Depression (PHQ2-9): Low Risk  (12/11/2020)  Social Connections: Moderately Integrated (05/13/2023)  Tobacco Use: Medium Risk (05/12/2023)    Readmission Risk Interventions     No data to display

## 2023-05-13 NOTE — Progress Notes (Signed)
 PROGRESS NOTE    Susan Hunt  ZOX:[id number] DOB: Feb 06, 1946 DOA: 05/12/2023 PCP: Willow Ora, MD   Brief Narrative:  Susan Hunt is a 78 y.o. female with medical history significant for chronic HFrEF s/p meropenem, idiopathic pulmonary fibrosis, T2DM, HTN, HLD, hypothyroidism who presented to the ED for evaluation of nausea and vomiting, 1 episode of hematemesis, suspicion of Mallory-Weiss tear.  He has chronic hypoxic respiratory failure but uses oxygen intermittently/2 L.  Recently started on Ozempic.  CT abdomen negative.  Chest x-ray with pulmonary edema and cardiomegaly.  Assessment & Plan:   Principal Problem:   Intractable vomiting Active Problems:   Acquired hypothyroidism   Mixed hyperlipidemia   Hypertension associated with diabetes (HCC)   Controlled type 2 diabetes mellitus without complication, without long-term current use of insulin (HCC)   ILD (interstitial lung disease) (HCC)   HFrEF (heart failure with reduced ejection fraction) (HCC)  Intractable nausea and vomiting: Patient with 2 days intractable nausea and vomiting with subsequent blood-tinged emesis.  Recently started on Ozempic after her insurance stopped paying for [facility].  Ozempic potentially triggering her GI symptoms with likely some Mallory-Weiss tearing due to frequent retching/emesis.  She is not on any blood thinners.  No prior EGD.  CT shows small sliding-type hiatal hernia without other acute pathology.  Hemoglobin is stable.  Still nauseous intermittently and vomited x 1 this morning and claims that she has had 4-5 diarrheal bowel movements which were black.  Despite of this, she wants to try to eat.  I will start her on clear liquid diet and will advance to full liquid diet later today if she tolerates this.  Will check C. difficile.  Continue scheduled Reglan and as needed Zofran as well as IV Protonix and monitor daily CBC.  Will also provide her with 500cc of IV fluid.   Chronic  HFrEF: Last EF 35-40% by TTE on 10/17/2022.  Chest x-ray shows possible pulmonary edema.  BNP not checked.  Appears euvolemic on admission but at risk of hypovolemia given ongoing emesis and poor oral intake. -Continue Coreg 25 mg twice daily and resume Aldactone. -Holding valsartan, Farxiga for now   Idiopathic pulmonary fibrosis: Stable.  Uses supplemental O2 as needed with exertion at home.  Continue Singulair and pirfenidone.   Type 2 diabetes: Last hemoglobin A1c from 4 months ago was 6.3.  Holding [place] and Ozempic.  Placed on SSI.  Slightly hyperglycemic.  Will monitor closely on SSI for now.   Hypertension: Continue Coreg and amlodipine.  Holding valsartan.    Hypothyroidism: Synthroid.   Hyperlipidemia: Continue rosuvastatin.   Depression: Continue venlafaxine XR.  DVT prophylaxis: SCDs Start: 05/12/23 2036Will start on Lovenox   Code Status: Do not attempt resuscitation (DNR) PRE-ARREST INTERVENTIONS DESIRED  Family Communication: Husband none present at bedside.  Plan of care discussed with patient in length and he/she verbalized understanding and agreed with it.  Status is: Observation The patient will require care spanning > 2 midnights and should be moved to inpatient because: Advancing diet gradually, still symptomatic.   Estimated body mass index is 27.45 kg/m as calculated from the following:   Height as of this encounter: 5\' 8"  (1.727 m).   Weight as of this encounter: 81.9 kg.    Nutritional Assessment: Body mass index is 27.45 kg/m.Marland Kitchen Seen by dietician.  I agree with the assessment and plan as outlined below: Nutrition Status:        . Skin Assessment: I have examined the patient's  skin and I agree with the wound assessment as performed by the wound care RN as outlined below:    Consultants:  None  Procedures:  None  Antimicrobials:  Anti-infectives (From admission, onward)    None         Subjective: Patient seen and examined,  complains of nausea and vomiting x 1 and multiple loose bowel movements, no abdominal pain or shortness of breath.  Husband at the bedside.  Objective: Vitals:   05/12/23 1949 05/12/23 2317 05/13/23 0330 05/13/23 0500  BP: (!) 158/99 (!) 154/84 (!) 145/84   Pulse: 80 85 95   Resp: 19 20 16    Temp:  97.9 F (36.6 C) 98.3 F (36.8 C)   TempSrc:      SpO2: 92% 98% 97%   Weight:    81.9 kg  Height:        Intake/Output Summary (Last 24 hours) at 05/13/2023 0811 Last data filed at 05/12/2023 1707 Gross per 24 hour  Intake 500 ml  Output --  Net 500 ml   Filed Weights   05/12/23 1946 05/13/23 0500  Weight: 81.9 kg 81.9 kg    Examination:  General exam: Appears calm and comfortable  Respiratory system: Clear to auscultation. Respiratory effort normal. Cardiovascular system: S1 & S2 heard, RRR. No JVD, murmurs, rubs, gallops or clicks. No pedal edema. Gastrointestinal system: Abdomen is nondistended, soft and nontender. No organomegaly or masses felt. Normal bowel sounds heard. Central nervous system: Alert and oriented. No focal neurological deficits. Extremities: Symmetric 5 x 5 power. Skin: No rashes, lesions or ulcers Psychiatry: Judgement and insight appear normal. Mood & affect appropriate.    Data Reviewed: I have personally reviewed following labs and imaging studies  CBC: Recent Labs  Lab 05/12/23 1101  WBC 14.7*  HGB 17.1*  HCT 51.9*  MCV 94.7  PLT 308   Basic Metabolic Panel: Recent Labs  Lab 05/12/23 1101  NA 140  K 3.7  CL 100  CO2 27  GLUCOSE 126*  BUN 19  CREATININE 1.17*  CALCIUM 10.5*   GFR: Estimated Creatinine Clearance: 44.5 mL/min (A) (by C-G formula based on SCr of 1.17 mg/dL (H)). Liver Function Tests: Recent Labs  Lab 05/12/23 1101  AST 20  ALT 14  ALKPHOS 83  BILITOT 0.7  PROT 8.5*  ALBUMIN 4.8   Recent Labs  Lab 05/12/23 1552  LIPASE 23   No results for input(s): "AMMONIA" in the last 168 hours. Coagulation  Profile: No results for input(s): "INR", "PROTIME" in the last 168 hours. Cardiac Enzymes: No results for input(s): "CKTOTAL", "CKMB", "CKMBINDEX", "TROPONINI" in the last 168 hours. BNP (last 3 results) No results for input(s): "PROBNP" in the last 8760 hours. HbA1C: No results for input(s): "HGBA1C" in the last 72 hours. CBG: Recent Labs  Lab 05/12/23 2055 05/13/23 0752  GLUCAP 128* 167*   Lipid Profile: No results for input(s): "CHOL", "HDL", "LDLCALC", "TRIG", "CHOLHDL", "LDLDIRECT" in the last 72 hours. Thyroid Function Tests: No results for input(s): "TSH", "T4TOTAL", "FREET4", "T3FREE", "THYROIDAB" in the last 72 hours. Anemia Panel: No results for input(s): "VITAMINB12", "FOLATE", "FERRITIN", "TIBC", "IRON", "RETICCTPCT" in the last 72 hours. Sepsis Labs: No results for input(s): "PROCALCITON", "LATICACIDVEN" in the last 168 hours.  Recent Results (from the past 240 hours)  Resp panel by RT-PCR (RSV, Flu A&B, Covid) Anterior Nasal Swab     Status: None   Collection Time: 05/12/23 11:01 AM   Specimen: Anterior Nasal Swab  Result Value Ref Range  Status   SARS Coronavirus 2 by RT PCR NEGATIVE NEGATIVE Final    Comment: (NOTE) SARS-CoV-2 target nucleic acids are NOT DETECTED.  The SARS-CoV-2 RNA is generally detectable in upper respiratory specimens during the acute phase of infection. The lowest concentration of SARS-CoV-2 viral copies this assay can detect is 138 copies/mL. A negative result does not preclude SARS-Cov-2 infection and should not be used as the sole basis for treatment or other patient management decisions. A negative result may occur with  improper specimen collection/handling, submission of specimen other than nasopharyngeal swab, presence of viral mutation(s) within the areas targeted by this assay, and inadequate number of viral copies(<138 copies/mL). A negative result must be combined with clinical observations, patient history, and  epidemiological information. The expected result is Negative.  Fact Sheet for Patients:  BloggerCourse.com  Fact Sheet for Healthcare Providers:  SeriousBroker.it  This test is no t yet approved or cleared by the [place] FDA and  has been authorized for detection and/or diagnosis of SARS-CoV-2 by FDA under an Emergency Use Authorization (EUA). This EUA will remain  in effect (meaning this test can be used) for the duration of the COVID-19 declaration under Section 564(b)(1) of the Act, 21 U.S.C.section 360bbb-3(b)(1), unless the authorization is terminated  or revoked sooner.       Influenza A by PCR NEGATIVE NEGATIVE Final   Influenza B by PCR NEGATIVE NEGATIVE Final    Comment: (NOTE) The Xpert Xpress SARS-CoV-2/FLU/RSV plus assay is intended as an aid in the diagnosis of influenza from Nasopharyngeal swab specimens and should not be used as a sole basis for treatment. Nasal washings and aspirates are unacceptable for Xpert Xpress SARS-CoV-2/FLU/RSV testing.  Fact Sheet for Patients: BloggerCourse.com  Fact Sheet for Healthcare Providers: SeriousBroker.it  This test is not yet approved or cleared by the [place] FDA and has been authorized for detection and/or diagnosis of SARS-CoV-2 by FDA under an Emergency Use Authorization (EUA). This EUA will remain in effect (meaning this test can be used) for the duration of the COVID-19 declaration under Section 564(b)(1) of the Act, 21 U.S.C. section 360bbb-3(b)(1), unless the authorization is terminated or revoked.     Resp Syncytial Virus by PCR NEGATIVE NEGATIVE Final    Comment: (NOTE) Fact Sheet for Patients: BloggerCourse.com  Fact Sheet for Healthcare Providers: SeriousBroker.it  This test is not yet approved or cleared by the [place] FDA and has been  authorized for detection and/or diagnosis of SARS-CoV-2 by FDA under an Emergency Use Authorization (EUA). This EUA will remain in effect (meaning this test can be used) for the duration of the COVID-19 declaration under Section 564(b)(1) of the Act, 21 U.S.C. section 360bbb-3(b)(1), unless the authorization is terminated or revoked.  Performed at [facility], [address] [place], [place] [address]      Radiology Studies: DG Chest 2 View Result Date: 05/12/2023 CLINICAL DATA:  Cough, vomiting. EXAM: CHEST - 2 VIEW COMPARISON:  January 04, 2021. FINDINGS: Mild cardiomegaly is noted. Mild central pulmonary vascular congestion is noted with possible bilateral pulmonary edema. Elevated right hemidiaphragm. Bony thorax is unremarkable. IMPRESSION: Mild cardiomegaly is noted with mild central pulmonary vascular congestion and possible bilateral pulmonary edema. Electronically Signed   By: Lupita Raider M.D.   On: 05/12/2023 14:50   CT ABDOMEN PELVIS W CONTRAST Result Date: 05/12/2023 CLINICAL DATA:  Acute generalized abdominal pain, hematemesis. EXAM: CT ABDOMEN AND PELVIS WITH CONTRAST TECHNIQUE: Multidetector CT imaging of the abdomen and pelvis  was performed using the standard protocol following bolus administration of intravenous contrast. RADIATION DOSE REDUCTION: This exam was performed according to the departmental dose-optimization program which includes automated exposure control, adjustment of the mA and/or kV according to patient size and/or use of iterative reconstruction technique. CONTRAST:  81 mL OMNIPAQUE IOHEXOL 300 MG/ML  SOLN COMPARISON:  December 06, 2018. FINDINGS: Lower chest: Probable minimal bibasilar scarring is noted. Hepatobiliary: Stable hepatic cysts. No gallstones, gallbladder wall thickening, or biliary dilatation. Pancreas: Unremarkable. No pancreatic ductal dilatation or surrounding inflammatory changes. Spleen: Normal in size without focal  abnormality. Adrenals/Urinary Tract: Adrenal glands appear normal. Right renal cyst is noted for which no further follow-up is required. No hydronephrosis or renal obstruction is noted. Urinary bladder is unremarkable. Stomach/Bowel: Small sliding-type hiatal hernia. No evidence of bowel obstruction or inflammation. The appendix appears normal. Vascular/Lymphatic: Aortic atherosclerosis. No enlarged abdominal or pelvic lymph nodes. Reproductive: Status post hysterectomy. No adnexal masses. Other: No abdominal wall hernia or abnormality. No abdominopelvic ascites. Musculoskeletal: No acute or significant osseous findings. IMPRESSION: Small sliding-type hiatal hernia. No other acute abnormality seen in the abdomen or pelvis. Aortic Atherosclerosis (ICD10-I70.0). Electronically Signed   By: Lupita Raider M.D.   On: 05/12/2023 14:48    Scheduled Meds:  amLODipine  5 mg Oral Daily   carvedilol  25 mg Oral BID   insulin aspart  0-9 Units Subcutaneous TID WC   levothyroxine  112 mcg Oral Q0600   metoCLOPramide (REGLAN) injection  10 mg Intravenous Q8H   montelukast  10 mg Oral QHS   pantoprazole (PROTONIX) IV  40 mg Intravenous Q12H   Pirfenidone  801 mg Oral TID with meals   rosuvastatin  10 mg Oral Daily   sodium chloride flush  3 mL Intravenous Q12H   venlafaxine XR  150 mg Oral Q breakfast   Continuous Infusions:   LOS: 0 days   Hughie Closs, MD [facility]  05/13/2023, 8:11 AM   *Please note that this is a verbal dictation therefore any spelling or grammatical errors are due to the "Dragon Medical One" system interpretation.  Please page via Amion and do not message via secure chat for urgent patient care matters. Secure chat can be used for non urgent patient care matters.  How to contact the [facility] Attending or Consulting provider 7A - 7P or covering provider during after hours 7P -7A, for this patient?  Check the care team in [facility] and look for a) attending/consulting TRH provider  listed and b) the [facility] team listed. Page or secure chat 7A-7P. Log into [email or web address] and use [facility]'s universal password to access. If you do not have the password, please contact the hospital operator. Locate the [facility] provider you are looking for under [facility] and page to a number that you can be directly reached. If you still have difficulty reaching the provider, please page the [facility] (Director on Call) for the Hospitalists listed on amion for assistance.

## 2023-05-14 DIAGNOSIS — R1110 Vomiting, unspecified: Secondary | ICD-10-CM | POA: Diagnosis not present

## 2023-05-14 DIAGNOSIS — K226 Gastro-esophageal laceration-hemorrhage syndrome: Secondary | ICD-10-CM | POA: Insufficient documentation

## 2023-05-14 LAB — BASIC METABOLIC PANEL
Anion gap: 10 (ref 5–15)
BUN: 38 mg/dL — ABNORMAL HIGH (ref 8–23)
CO2: 23 mmol/L (ref 22–32)
Calcium: 8.8 mg/dL — ABNORMAL LOW (ref 8.9–10.3)
Chloride: 103 mmol/L (ref 98–111)
Creatinine, Ser: 1.27 mg/dL — ABNORMAL HIGH (ref 0.44–1.00)
GFR, Estimated: 43 mL/min — ABNORMAL LOW (ref >60)
Glucose, Bld: 86 mg/dL (ref 70–99)
Potassium: 4.1 mmol/L (ref 3.5–5.1)
Sodium: 136 mmol/L (ref 135–145)

## 2023-05-14 LAB — GLUCOSE, CAPILLARY: Glucose-Capillary: 91 mg/dL (ref 70–99)

## 2023-05-14 LAB — CBC WITH DIFFERENTIAL/PLATELET
Abs Immature Granulocytes: 0.02 10*3/uL (ref 0.00–0.07)
Basophils Absolute: 0 10*3/uL (ref 0.0–0.1)
Basophils Relative: 0 %
Eosinophils Absolute: 0.2 10*3/uL (ref 0.0–0.5)
Eosinophils Relative: 2 %
HCT: 44.3 % (ref 36.0–46.0)
Hemoglobin: 13.8 g/dL (ref 12.0–15.0)
Immature Granulocytes: 0 %
Lymphocytes Relative: 31 %
Lymphs Abs: 3.1 10*3/uL (ref 0.7–4.0)
MCH: 30.7 pg (ref 26.0–34.0)
MCHC: 31.2 g/dL (ref 30.0–36.0)
MCV: 98.4 fL (ref 80.0–100.0)
Monocytes Absolute: 0.9 10*3/uL (ref 0.1–1.0)
Monocytes Relative: 9 %
Neutro Abs: 5.9 10*3/uL (ref 1.7–7.7)
Neutrophils Relative %: 58 %
Platelets: 205 10*3/uL (ref 150–400)
RBC: 4.5 MIL/uL (ref 3.87–5.11)
RDW: 12.4 % (ref 11.5–15.5)
WBC: 10.1 10*3/uL (ref 4.0–10.5)
nRBC: 0 % (ref 0.0–0.2)

## 2023-05-14 MED ORDER — VALSARTAN 320 MG PO TABS
320.0000 mg | ORAL_TABLET | Freq: Every day | ORAL | Status: DC
Start: 2023-05-16 — End: 2023-07-10

## 2023-05-14 MED ORDER — OMEPRAZOLE 40 MG PO CPDR: 40.0000 mg | DELAYED_RELEASE_CAPSULE | Freq: Every day | ORAL | 0 refills | Status: DC

## 2023-05-14 MED ORDER — SPIRONOLACTONE 25 MG PO TABS: 25.0000 mg | ORAL_TABLET | Freq: Every day | ORAL | Status: DC

## 2023-05-14 MED ORDER — FARXIGA 10 MG PO TABS
10.0000 mg | ORAL_TABLET | Freq: Every day | ORAL | Status: AC
Start: 2023-05-16 — End: ?

## 2023-05-14 NOTE — Care Management Obs Status (Signed)
 MEDICARE OBSERVATION STATUS NOTIFICATION   Patient Details  Name: Susan Hunt MRN: [id number] Date of Birth: [birth year]   Medicare Observation Status Notification Given:  Yes    Adrian Prows, RN 05/14/2023, 9:37 AM

## 2023-05-14 NOTE — Discharge Summary (Signed)
 Physician Discharge Summary  Susan Hunt QMV:[id number] DOB: 08-24-1945 DOA: 05/12/2023  PCP: Susan Ora, MD  Admit date: 05/12/2023 Discharge date: 05/14/2023    Admitted From: Home Disposition: Home  Recommendations for Outpatient Follow-up:  Follow up with PCP in 1-2 weeks Please obtain BMP/CBC in one week Please follow up with your PCP on the following pending results: Unresulted Labs (From admission, onward)     Start     Ordered   05/13/23 1058  C Difficile Quick Screen w PCR reflex  (C Difficile quick screen w PCR reflex panel )  Once, for 24 hours,   TIMED       References:    CDiff Information Tool   05/13/23 1057              Home Health: None Equipment/Devices: None  Discharge Condition: Stable CODE STATUS: DNR Diet recommendation: Cardiac  Subjective: Seen and examined, no complaints, no diarrhea or vomiting, tolerating full liquid diet.  Advance to soft diet which she has not tried.  She is very eager to go home " right now".  Has mild renal insufficiency, offered IV fluids but she said she would rather go home and drink plenty of fluids at home.  She was advised to do so for 1 more day.  Not to overdo due to history of systolic congestive heart failure.  Brief/Interim Summary: Susan Hunt is a 78 y.o. female with medical history significant for chronic HFrEF s/p meropenem, idiopathic pulmonary fibrosis, T2DM, HTN, HLD, hypothyroidism who presented to the ED for evaluation of nausea and vomiting, 1 episode of hematemesis, suspicion of Mallory-Weiss tear.  He has chronic hypoxic respiratory failure but uses oxygen intermittently/2 L.  Recently started on Ozempic.  CT abdomen negative.  Chest x-ray with pulmonary edema and cardiomegaly.  Details below.  Intractable nausea and vomiting/suspected Mallory-Weiss tear: Patient with 2 days intractable nausea and vomiting with subsequent blood-tinged emesis.  Recently started on Ozempic after her insurance  stopped paying for [facility].  Ozempic potentially triggering her GI symptoms with likely some Mallory-Weiss tearing due to frequent retching/emesis.  She is not on any blood thinners.  No prior EGD.  CT shows small sliding-type hiatal hernia without other acute pathology.  Hemoglobin is stable.  Patient was started on Protonix, supportive care.  No more symptoms since yesterday.  Tolerating full liquid diet.  Advance to soft diet but she has to go home and try soft at home.  Due to clinical suspicion of Mallory-Weiss tear, discharging on 28 days of omeprazole 40 mg p.o. daily.  Hemoglobin remained stable.    Chronic HFrEF: Last EF 35-40% by TTE on 10/17/2022.  Chest x-ray shows possible pulmonary edema.  BNP not checked however clinically appears euvolemic since admission with no crackles and no shortness of breath. -Continue Coreg 25 mg twice daily and resume Aldactone on 05/15/2023, resume losartan and Farxiga on 05/16/2023.   Idiopathic pulmonary fibrosis: Stable.  Uses supplemental O2 as needed with exertion at home.  Continue Singulair and pirfenidone.   Type 2 diabetes: Last hemoglobin A1c from 4 months ago was 6.3.  Holding Towanda and Ozempic.  Placed on SSI.  Slightly hyperglycemic.  Will monitor closely on SSI for now.   Hypertension: Continue Coreg and amlodipine.     Hypothyroidism: Synthroid.   Hyperlipidemia: Continue rosuvastatin.   Depression: Continue venlafaxine XR.   Discharge Diagnoses:  Principal Problem:   Intractable vomiting Active Problems:   Acquired hypothyroidism   Mixed hyperlipidemia  Hypertension associated with diabetes (HCC)   Controlled type 2 diabetes mellitus without complication, without long-term current use of insulin (HCC)   ILD (interstitial lung disease) (HCC)   HFrEF (heart failure with reduced ejection fraction) (HCC)   Mallory-Weiss tear    Discharge Instructions   Allergies as of 05/14/2023       Reactions   Ace Inhibitors Cough    Clarithromycin    REACTION: GI sx   Codeine Nausea Only   Sulfamethoxazole Nausea Only   Telmisartan Other (See Comments)   sleepy        Medication List     TAKE these medications    amLODipine 5 MG tablet Commonly known as: NORVASC Take 1 tablet (5 mg total) by mouth daily.   carvedilol 25 MG tablet Commonly known as: COREG Take 1 tablet (25 mg total) by mouth 2 (two) times daily.   cetirizine 10 MG tablet Commonly known as: ZYRTEC Take 10 mg by mouth daily.   Farxiga 10 MG Tabs tablet Generic drug: dapagliflozin propanediol Take 1 tablet (10 mg total) by mouth daily. Start taking on: May 16, 2023 What changed: These instructions start on May 16, 2023. If you are unsure what to do until then, ask your doctor or other care provider.   levothyroxine 112 MCG tablet Commonly known as: SYNTHROID Take 1 tablet (112 mcg total) by mouth daily.   montelukast 10 MG tablet Commonly known as: SINGULAIR TAKE 1 TABLET BY MOUTH EVERY NIGHT AT BEDTIME   multivitamin with minerals Tabs tablet Take 1 tablet by mouth daily.   omeprazole 40 MG capsule Commonly known as: PRILOSEC Take 1 capsule (40 mg total) by mouth daily for 28 days. What changed:  medication strength See the new instructions.   ondansetron 4 MG disintegrating tablet Commonly known as: ZOFRAN-ODT DISSOLVE ONE TABLET BY MOUTH EVERY 8 HOURS AS NEEDED FOR NAUSEA/ VOMITING   Ozempic (2 MG/DOSE) 8 MG/3ML Sopn Generic drug: Semaglutide (2 MG/DOSE) Inject 2 mg into the skin once a week.   Pirfenidone 801 MG Tabs Commonly known as: Esbriet Take 1 tablet (801 mg total) by mouth with breakfast, with lunch, and with evening meal.   rosuvastatin 10 MG tablet Commonly known as: CRESTOR TAKE 1 TABLET BY MOUTH DAILY What changed: when to take this   spironolactone 25 MG tablet Commonly known as: ALDACTONE Take 1 tablet (25 mg total) by mouth daily. Start taking on: May 15, 2023   valsartan 320 MG  tablet Commonly known as: DIOVAN Take 1 tablet (320 mg total) by mouth daily. Start taking on: May 16, 2023 What changed: These instructions start on May 16, 2023. If you are unsure what to do until then, ask your doctor or other care provider.   venlafaxine XR 150 MG 24 hr capsule Commonly known as: EFFEXOR-XR Take 1 capsule (150 mg total) by mouth daily.        Follow-up Information     Susan Ora, MD Follow up in 1 week(s).   Specialty: Family Medicine Contact information: [address] [place] [address] [phone]                Allergies  Allergen Reactions   Ace Inhibitors Cough   Clarithromycin     REACTION: GI sx   Codeine Nausea Only   Sulfamethoxazole Nausea Only   Telmisartan Other (See Comments)    sleepy    Consultations: None   Procedures/Studies: DG Chest 2 View Result Date: 05/12/2023 CLINICAL  DATA:  Cough, vomiting. EXAM: CHEST - 2 VIEW COMPARISON:  January 04, 2021. FINDINGS: Mild cardiomegaly is noted. Mild central pulmonary vascular congestion is noted with possible bilateral pulmonary edema. Elevated right hemidiaphragm. Bony thorax is unremarkable. IMPRESSION: Mild cardiomegaly is noted with mild central pulmonary vascular congestion and possible bilateral pulmonary edema. Electronically Signed   By: Lupita Raider M.D.   On: 05/12/2023 14:50   CT ABDOMEN PELVIS W CONTRAST Result Date: 05/12/2023 CLINICAL DATA:  Acute generalized abdominal pain, hematemesis. EXAM: CT ABDOMEN AND PELVIS WITH CONTRAST TECHNIQUE: Multidetector CT imaging of the abdomen and pelvis was performed using the standard protocol following bolus administration of intravenous contrast. RADIATION DOSE REDUCTION: This exam was performed according to the departmental dose-optimization program which includes automated exposure control, adjustment of the mA and/or kV according to patient size and/or use of iterative reconstruction technique. CONTRAST:  81 mL  OMNIPAQUE IOHEXOL 300 MG/ML  SOLN COMPARISON:  December 06, 2018. FINDINGS: Lower chest: Probable minimal bibasilar scarring is noted. Hepatobiliary: Stable hepatic cysts. No gallstones, gallbladder wall thickening, or biliary dilatation. Pancreas: Unremarkable. No pancreatic ductal dilatation or surrounding inflammatory changes. Spleen: Normal in size without focal abnormality. Adrenals/Urinary Tract: Adrenal glands appear normal. Right renal cyst is noted for which no further follow-up is required. No hydronephrosis or renal obstruction is noted. Urinary bladder is unremarkable. Stomach/Bowel: Small sliding-type hiatal hernia. No evidence of bowel obstruction or inflammation. The appendix appears normal. Vascular/Lymphatic: Aortic atherosclerosis. No enlarged abdominal or pelvic lymph nodes. Reproductive: Status post hysterectomy. No adnexal masses. Other: No abdominal wall hernia or abnormality. No abdominopelvic ascites. Musculoskeletal: No acute or significant osseous findings. IMPRESSION: Small sliding-type hiatal hernia. No other acute abnormality seen in the abdomen or pelvis. Aortic Atherosclerosis (ICD10-I70.0). Electronically Signed   By: Lupita Raider M.D.   On: 05/12/2023 14:48     Discharge Exam: Vitals:   05/13/23 2043 05/14/23 0611  BP: 103/64 109/63  Pulse: 70 67  Resp: 16 16  Temp: 98.1 F (36.7 C) 98 F (36.7 C)  SpO2: 92% 90%   Vitals:   05/13/23 1324 05/13/23 2043 05/14/23 0125 05/14/23 0611  BP: (!) 141/70 103/64  109/63  Pulse: 80 70  67  Resp:  16  16  Temp: 98.4 F (36.9 C) 98.1 F (36.7 C)  98 F (36.7 C)  TempSrc: Oral     SpO2: 91% 92%  90%  Weight:   82.1 kg   Height:        General: Pt is alert, awake, not in acute distress Cardiovascular: RRR, S1/S2 +, no rubs, no gallops Respiratory: CTA bilaterally, no wheezing, no rhonchi Abdominal: Soft, NT, ND, bowel sounds + Extremities: no edema, no cyanosis    The results of significant diagnostics from  this hospitalization (including imaging, microbiology, ancillary and laboratory) are listed below for reference.     Microbiology: Recent Results (from the past 240 hours)  Resp panel by RT-PCR (RSV, Flu A&B, Covid) Anterior Nasal Swab     Status: None   Collection Time: 05/12/23 11:01 AM   Specimen: Anterior Nasal Swab  Result Value Ref Range Status   SARS Coronavirus 2 by RT PCR NEGATIVE NEGATIVE Final    Comment: (NOTE) SARS-CoV-2 target nucleic acids are NOT DETECTED.  The SARS-CoV-2 RNA is generally detectable in upper respiratory specimens during the acute phase of infection. The lowest concentration of SARS-CoV-2 viral copies this assay can detect is 138 copies/mL. A negative result does not preclude SARS-Cov-2 infection and  should not be used as the sole basis for treatment or other patient management decisions. A negative result may occur with  improper specimen collection/handling, submission of specimen other than nasopharyngeal swab, presence of viral mutation(s) within the areas targeted by this assay, and inadequate number of viral copies(<138 copies/mL). A negative result must be combined with clinical observations, patient history, and epidemiological information. The expected result is Negative.  Fact Sheet for Patients:  BloggerCourse.com  Fact Sheet for Healthcare Providers:  SeriousBroker.it  This test is no t yet approved or cleared by the [place] FDA and  has been authorized for detection and/or diagnosis of SARS-CoV-2 by FDA under an Emergency Use Authorization (EUA). This EUA will remain  in effect (meaning this test can be used) for the duration of the COVID-19 declaration under Section 564(b)(1) of the Act, 21 U.S.C.section 360bbb-3(b)(1), unless the authorization is terminated  or revoked sooner.       Influenza A by PCR NEGATIVE NEGATIVE Final   Influenza B by PCR NEGATIVE NEGATIVE Final     Comment: (NOTE) The Xpert Xpress SARS-CoV-2/FLU/RSV plus assay is intended as an aid in the diagnosis of influenza from Nasopharyngeal swab specimens and should not be used as a sole basis for treatment. Nasal washings and aspirates are unacceptable for Xpert Xpress SARS-CoV-2/FLU/RSV testing.  Fact Sheet for Patients: BloggerCourse.com  Fact Sheet for Healthcare Providers: SeriousBroker.it  This test is not yet approved or cleared by the [place] FDA and has been authorized for detection and/or diagnosis of SARS-CoV-2 by FDA under an Emergency Use Authorization (EUA). This EUA will remain in effect (meaning this test can be used) for the duration of the COVID-19 declaration under Section 564(b)(1) of the Act, 21 U.S.C. section 360bbb-3(b)(1), unless the authorization is terminated or revoked.     Resp Syncytial Virus by PCR NEGATIVE NEGATIVE Final    Comment: (NOTE) Fact Sheet for Patients: BloggerCourse.com  Fact Sheet for Healthcare Providers: SeriousBroker.it  This test is not yet approved or cleared by the [place] FDA and has been authorized for detection and/or diagnosis of SARS-CoV-2 by FDA under an Emergency Use Authorization (EUA). This EUA will remain in effect (meaning this test can be used) for the duration of the COVID-19 declaration under Section 564(b)(1) of the Act, 21 U.S.C. section 360bbb-3(b)(1), unless the authorization is terminated or revoked.  Performed at [facility], [address] [place], [place] [address]      Labs: BNP (last 3 results) Recent Labs    09/09/22 1554  BNP 35   Basic Metabolic Panel: Recent Labs  Lab 05/12/23 1101 05/13/23 0730 05/14/23 0620  NA 140 140 136  K 3.7 4.1 4.1  CL 100 101 103  CO2 27 25 23   GLUCOSE 126* 158* 86  BUN 19 31* 38*  CREATININE 1.17* 0.83 1.27*  CALCIUM 10.5*  9.3 8.8*   Liver Function Tests: Recent Labs  Lab 05/12/23 1101  AST 20  ALT 14  ALKPHOS 83  BILITOT 0.7  PROT 8.5*  ALBUMIN 4.8   Recent Labs  Lab 05/12/23 1552  LIPASE 23   No results for input(s): "AMMONIA" in the last 168 hours. CBC: Recent Labs  Lab 05/12/23 1101 05/13/23 0730 05/14/23 0620  WBC 14.7* 17.6* 10.1  NEUTROABS  --   --  5.9  HGB 17.1* 16.2* 13.8  HCT 51.9* 50.8* 44.3  MCV 94.7 96.0 98.4  PLT 308 271 205   Cardiac Enzymes: No results for input(s): "CKTOTAL", "CKMB", "  CKMBINDEX", "TROPONINI" in the last 168 hours. BNP: Invalid input(s): "POCBNP" CBG: Recent Labs  Lab 05/13/23 0752 05/13/23 1206 05/13/23 1602 05/13/23 2048 05/14/23 0837  GLUCAP 167* 166* 117* 104* 91   D-Dimer No results for input(s): "DDIMER" in the last 72 hours. Hgb A1c No results for input(s): "HGBA1C" in the last 72 hours. Lipid Profile No results for input(s): "CHOL", "HDL", "LDLCALC", "TRIG", "CHOLHDL", "LDLDIRECT" in the last 72 hours. Thyroid function studies No results for input(s): "TSH", "T4TOTAL", "T3FREE", "THYROIDAB" in the last 72 hours.  Invalid input(s): "FREET3" Anemia work up No results for input(s): "VITAMINB12", "FOLATE", "FERRITIN", "TIBC", "IRON", "RETICCTPCT" in the last 72 hours. Urinalysis    Component Value Date/Time   COLORURINE YELLOW 12/22/2021 1321   APPEARANCEUR CLEAR 12/22/2021 1321   LABSPEC 1.021 12/22/2021 1321   PHURINE 5.0 12/22/2021 1321   GLUCOSEU >=500 (A) 12/22/2021 1321   GLUCOSEU >=1000 (A) 06/14/2021 1422   HGBUR NEGATIVE 12/22/2021 1321   BILIRUBINUR NEGATIVE 12/22/2021 1321   BILIRUBINUR Negative 04/10/2019 0907   KETONESUR NEGATIVE 12/22/2021 1321   PROTEINUR NEGATIVE 12/22/2021 1321   UROBILINOGEN 0.2 06/14/2021 1422   NITRITE NEGATIVE 12/22/2021 1321   LEUKOCYTESUR NEGATIVE 12/22/2021 1321   Sepsis Labs Recent Labs  Lab 05/12/23 1101 05/13/23 0730 05/14/23 0620  WBC 14.7* 17.6* 10.1    Microbiology Recent Results (from the past 240 hours)  Resp panel by RT-PCR (RSV, Flu A&B, Covid) Anterior Nasal Swab     Status: None   Collection Time: 05/12/23 11:01 AM   Specimen: Anterior Nasal Swab  Result Value Ref Range Status   SARS Coronavirus 2 by RT PCR NEGATIVE NEGATIVE Final    Comment: (NOTE) SARS-CoV-2 target nucleic acids are NOT DETECTED.  The SARS-CoV-2 RNA is generally detectable in upper respiratory specimens during the acute phase of infection. The lowest concentration of SARS-CoV-2 viral copies this assay can detect is 138 copies/mL. A negative result does not preclude SARS-Cov-2 infection and should not be used as the sole basis for treatment or other patient management decisions. A negative result may occur with  improper specimen collection/handling, submission of specimen other than nasopharyngeal swab, presence of viral mutation(s) within the areas targeted by this assay, and inadequate number of viral copies(<138 copies/mL). A negative result must be combined with clinical observations, patient history, and epidemiological information. The expected result is Negative.  Fact Sheet for Patients:  BloggerCourse.com  Fact Sheet for Healthcare Providers:  SeriousBroker.it  This test is no t yet approved or cleared by the [place] FDA and  has been authorized for detection and/or diagnosis of SARS-CoV-2 by FDA under an Emergency Use Authorization (EUA). This EUA will remain  in effect (meaning this test can be used) for the duration of the COVID-19 declaration under Section 564(b)(1) of the Act, 21 U.S.C.section 360bbb-3(b)(1), unless the authorization is terminated  or revoked sooner.       Influenza A by PCR NEGATIVE NEGATIVE Final   Influenza B by PCR NEGATIVE NEGATIVE Final    Comment: (NOTE) The Xpert Xpress SARS-CoV-2/FLU/RSV plus assay is intended as an aid in the diagnosis of influenza  from Nasopharyngeal swab specimens and should not be used as a sole basis for treatment. Nasal washings and aspirates are unacceptable for Xpert Xpress SARS-CoV-2/FLU/RSV testing.  Fact Sheet for Patients: BloggerCourse.com  Fact Sheet for Healthcare Providers: SeriousBroker.it  This test is not yet approved or cleared by the [place] FDA and has been authorized for detection and/or diagnosis of SARS-CoV-2 by  FDA under an Emergency Use Authorization (EUA). This EUA will remain in effect (meaning this test can be used) for the duration of the COVID-19 declaration under Section 564(b)(1) of the Act, 21 U.S.C. section 360bbb-3(b)(1), unless the authorization is terminated or revoked.     Resp Syncytial Virus by PCR NEGATIVE NEGATIVE Final    Comment: (NOTE) Fact Sheet for Patients: BloggerCourse.com  Fact Sheet for Healthcare Providers: SeriousBroker.it  This test is not yet approved or cleared by the [place] FDA and has been authorized for detection and/or diagnosis of SARS-CoV-2 by FDA under an Emergency Use Authorization (EUA). This EUA will remain in effect (meaning this test can be used) for the duration of the COVID-19 declaration under Section 564(b)(1) of the Act, 21 U.S.C. section 360bbb-3(b)(1), unless the authorization is terminated or revoked.  Performed at [facility], [address], [place], [place] [address]     FURTHER DISCHARGE INSTRUCTIONS:   Get Medicines reviewed and adjusted: Please take all your medications with you for your next visit with your Primary MD   Laboratory/radiological data: Please request your Primary MD to go over all hospital tests and procedure/radiological results at the follow up, please ask your Primary MD to get all Hospital records sent to his/her office.   In some cases, they will be blood work,  cultures and biopsy results pending at the time of your discharge. Please request that your primary care M.D. goes through all the records of your hospital data and follows up on these results.   Also Note the following: If you experience worsening of your admission symptoms, develop shortness of breath, life threatening emergency, suicidal or homicidal thoughts you must seek medical attention immediately by calling 911 or calling your MD immediately  if symptoms less severe.   You must read complete instructions/literature along with all the possible adverse reactions/side effects for all the Medicines you take and that have been prescribed to you. Take any new Medicines after you have completely understood and accpet all the possible adverse reactions/side effects.    Do not drive when taking Pain medications or sleeping medications (Benzodaizepines)   Do not take more than prescribed Pain, Sleep and Anxiety Medications. It is not advisable to combine anxiety,sleep and pain medications without talking with your primary care practitioner   Special Instructions: If you have smoked or chewed Tobacco  in the last 2 yrs please stop smoking, stop any regular Alcohol  and or any Recreational drug use.   Wear Seat belts while driving.   Please note: You were cared for by a hospitalist during your hospital stay. Once you are discharged, your primary care physician will handle any further medical issues. Please note that NO REFILLS for any discharge medications will be authorized once you are discharged, as it is imperative that you return to your primary care physician (or establish a relationship with a primary care physician if you do not have one) for your post hospital discharge needs so that they can reassess your need for medications and monitor your lab values  Time coordinating discharge: Over 30 minutes  SIGNED:   Hughie Closs, MD  [facility] 05/14/2023, 10:18 AM *Please note that  this is a verbal dictation therefore any spelling or grammatical errors are due to the "Dragon Medical One" system interpretation. If 7PM-7AM, please contact night-coverage [email or web address]

## 2023-05-14 NOTE — TOC Initial Note (Addendum)
 Transition of Care [facility]) - Initial/Assessment Note    Patient Details  Name: Susan Hunt MRN: [id number] Date of Birth: [birth year]  Transition of Care [facility]) CM/SW Contact:    Susan Prows, RN Phone Number: 05/14/2023, 9:40 AM  Clinical Narrative:                 Spoke w/ pt in room; pt says she lives at home w/ her husband Susan Hunt [phone]); she plans to return at d/c; pt says her husband will provide transportation; she verified PCP/insurance; pt denies SDOH risks; pt says she has an Susan Hunt that she uses as needed; she does not have HH services or other DME; no TOC needs. TOC is signing off; please place consult if needed.  Expected Discharge Plan: Home/Self Care Barriers to Discharge: Continued Medical Work up   Patient Goals and CMS Choice Patient states their goals for this hospitalization and ongoing recovery are:: home CMS Medicare.gov Compare Post Acute Care list provided to:: Patient        Expected Discharge Plan and Services   Discharge Planning Services: CM Consult   Living arrangements for the past 2 months: Single Family Home                                      Prior Living Arrangements/Services Living arrangements for the past 2 months: Single Family Home Lives with:: Self Patient language and need for interpreter reviewed:: Yes Do you feel safe going back to the place where you live?: Yes      Need for Family Participation in Patient Care: Yes (Comment) Care giver support system in place?: Yes (comment) Current home services: Other (comment) (Susan Hunt) Criminal Activity/Legal Involvement Pertinent to Current Situation/Hospitalization: No - Comment as needed  Activities of Daily Living   ADL Screening (condition at time of admission) Independently performs ADLs?: Yes (appropriate for developmental age) Is the patient deaf or have difficulty hearing?: Yes Does the patient have difficulty seeing, even when wearing  glasses/contacts?: No Does the patient have difficulty concentrating, remembering, or making decisions?: No  Permission Sought/Granted Permission sought to share information with : Case Manager Permission granted to share information with : Yes, Verbal Permission Granted  Share Information with NAME: Case Manager     Permission granted to share info w Relationship: Susan Hunt (spouse) [phone]     Emotional Assessment Appearance:: Appears stated age Attitude/Demeanor/Rapport: Gracious Affect (typically observed): Accepting Orientation: : Oriented to Self, Oriented to Place, Oriented to  Time, Oriented to Situation Alcohol / Substance Use: Not Applicable Psych Involvement: No (comment)  Admission diagnosis:  Intractable vomiting [R11.10] Hematemesis with nausea [K92.0] Patient Active Problem List   Diagnosis Date Noted   Intractable vomiting 05/12/2023   Research study patient 06/24/2020   ILD (interstitial lung disease) (HCC) 11/01/2019   HFrEF (heart failure with reduced ejection fraction) (HCC) 11/01/2019   Chronic vertigo 07/19/2019   Controlled type 2 diabetes mellitus without complication, without long-term current use of insulin (HCC) 07/19/2019   Recurrent UTI 04/12/2019   Tubular adenoma of colon 03/26/2019   Hepatic steatosis 03/26/2019   ACE-inhibitor cough 10/02/2017   Postmenopausal symptoms 02/14/2017   Osteopenia of femoral neck 05/24/2016   Grade II hemorrhoids 05/03/2016   Panlobular emphysema (HCC) 06/17/2014   Mild obstructive sleep apnea 05/06/2014   Major depression, recurrent, chronic (HCC) 01/28/2014   Obesity (BMI 30.0-34.9) 07/15/2013   Allergic rhinitis  12/18/2009   Acquired hypothyroidism 04/11/2007   Mixed hyperlipidemia 04/11/2007   Hypertension associated with diabetes (HCC) 04/11/2007   Rosacea 04/11/2007   URINARY INCONTINENCE 04/11/2007   RENAL CALCULUS, HX OF 04/11/2007   PCP:  Susan Ora, MD Pharmacy:   [facility]  PHARMACY [id number] - [place], [place] - [address] [place] [address] Phone: [phone] Fax: [phone]  [facility] [address] [place] [address] Phone: [phone] Fax: [phone]  [facility] - [place], [place] - [address] [place] [address] Phone: [phone] Fax: [phone]     Social Drivers of Health (SDOH) Social History: SDOH Screenings   Food Insecurity: No Food Insecurity (05/14/2023)  Housing: Low Risk  (05/14/2023)  Transportation Needs: No Transportation Needs (05/14/2023)  Utilities: Not At Risk (05/14/2023)  Depression (PHQ2-9): Low Risk  (12/11/2020)  Social Connections: Moderately Integrated (05/13/2023)  Tobacco Use: Medium Risk (05/12/2023)   SDOH Interventions: Food Insecurity Interventions: Intervention Not Indicated, Inpatient TOC Housing Interventions: Intervention Not Indicated, Inpatient TOC Transportation Interventions: Intervention Not Indicated, Inpatient TOC Utilities Interventions: Intervention Not Indicated, Inpatient TOC   Readmission Risk Interventions     No data to display

## 2023-05-14 NOTE — Plan of Care (Signed)

## 2023-05-14 NOTE — Plan of Care (Signed)
  Problem: Education: Goal: Knowledge of General Education information will improve Description: Including pain rating scale, medication(s)/side effects and non-pharmacologic comfort measures Outcome: Progressing   Problem: Health Behavior/Discharge Planning: Goal: Ability to manage health-related needs will improve Outcome: Progressing   Problem: Clinical Measurements: Goal: Ability to maintain clinical measurements within normal limits will improve Outcome: Progressing Goal: Diagnostic test results will improve Outcome: Progressing   Problem: Activity: Goal: Risk for activity intolerance will decrease Outcome: Progressing   Problem: Nutrition: Goal: Adequate nutrition will be maintained Outcome: Progressing   Problem: Pain Managment: Goal: General experience of comfort will improve and/or be controlled Outcome: Progressing   Problem: Safety: Goal: Ability to remain free from injury will improve Outcome: Progressing

## 2023-05-15 ENCOUNTER — Telehealth: Payer: Self-pay

## 2023-05-15 NOTE — Transitions of Care (Post Inpatient/ED Visit) (Unsigned)
   05/15/2023  Name: Susan Hunt MRN: [id number] DOB: [birth year]  Today's TOC FU Call Status: Today's TOC FU Call Status:: Unsuccessful Call (1st Attempt) Unsuccessful Call (1st Attempt) Date: 05/15/23  Attempted to reach the patient regarding the most recent Inpatient/ED visit.  Follow Up Plan: Additional outreach attempts will be made to reach the patient to complete the Transitions of Care (Post Inpatient/ED visit) call.   Signature Karena Addison, LPN [facility] Nurse Health Advisor Direct Dial [phone]

## 2023-05-16 NOTE — Transitions of Care (Post Inpatient/ED Visit) (Signed)
 05/16/2023  Name: Susan Hunt MRN: [id number] DOB: 1945-03-19  Today's TOC FU Call Status: Today's TOC FU Call Status:: Successful TOC FU Call Completed Unsuccessful Call (1st Attempt) Date: 05/15/23 [facility] FU Call Complete Date: 05/16/23 Patient's Name and Date of Birth confirmed.  Transition Care Management Follow-up Telephone Call Date of Discharge: 05/14/23 Discharge Facility: [facility]) Type of Discharge: Emergency Department Reason for ED Visit: Other: (hematemesis) How have you been since you were released from the hospital?: Better Any questions or concerns?: No  Items Reviewed: Did you receive and understand the discharge instructions provided?: Yes Medications obtained,verified, and reconciled?: Yes (Medications Reviewed) Any new allergies since your discharge?: No Dietary orders reviewed?: Yes Do you have support at home?: Yes People in Home: spouse  Medications Reviewed Today: Medications Reviewed Today     Reviewed by Karena Addison, LPN (Licensed Practical Nurse) on 05/16/23 at 1649  Med List Status: <None>   Medication Order Taking? Sig Documenting Provider Last Dose Status Informant  amLODipine (NORVASC) 5 MG tablet [id number] No Take 1 tablet (5 mg total) by mouth daily. Parke Poisson, MD 05/09/2023 Active Spouse/Significant Other  carvedilol (COREG) 25 MG tablet [id number] No Take 1 tablet (25 mg total) by mouth 2 (two) times daily. Parke Poisson, MD 05/11/2023 Morning Active Spouse/Significant Other  cetirizine (ZYRTEC) 10 MG tablet [id number] No Take 10 mg by mouth daily. [provider] 05/09/2023 Active Spouse/Significant Other  FARXIGA 10 MG TABS tablet [id number]  Take 1 tablet (10 mg total) by mouth daily. Hughie Closs, MD  Active   levothyroxine (SYNTHROID) 112 MCG tablet [id number] No Take 1 tablet (112 mcg total) by mouth daily. Willow Ora, MD 05/09/2023 Active Spouse/Significant Other  montelukast (SINGULAIR) 10 MG tablet  [id number] No TAKE 1 TABLET BY MOUTH EVERY NIGHT AT BEDTIME Willow Ora, MD 05/09/2023 Active Spouse/Significant Other  Multiple Vitamin (MULTIVITAMIN WITH MINERALS) TABS tablet [id number] No Take 1 tablet by mouth daily. [provider] 05/09/2023 Active Spouse/Significant Other  omeprazole (PRILOSEC) 40 MG capsule [id number]  Take 1 capsule (40 mg total) by mouth daily for 28 days. Hughie Closs, MD  Active   ondansetron (ZOFRAN-ODT) 4 MG disintegrating tablet [id number] No DISSOLVE ONE TABLET BY MOUTH EVERY 8 HOURS AS NEEDED FOR NAUSEA/ VOMITING Willow Ora, MD 05/11/2023 Evening Active Spouse/Significant Other  Pirfenidone (ESBRIET) 801 MG TABS [id number] No Take 1 tablet (801 mg total) by mouth with breakfast, with lunch, and with evening meal. Kalman Shan, MD 05/11/2023 Morning Active Spouse/Significant Other  rosuvastatin (CRESTOR) 10 MG tablet [id number] No TAKE 1 TABLET BY MOUTH DAILY  Patient taking differently: Take 10 mg by mouth every evening.   Willow Ora, MD 05/09/2023 Active Spouse/Significant Other  Semaglutide, 2 MG/DOSE, (OZEMPIC, 2 MG/DOSE,) 8 MG/3ML SOPN [id number] No Inject 2 mg into the skin once a week. Parke Poisson, MD 05/11/2023 Morning Active Spouse/Significant Other  spironolactone (ALDACTONE) 25 MG tablet [id number]  Take 1 tablet (25 mg total) by mouth daily. Hughie Closs, MD  Active   valsartan (DIOVAN) 320 MG tablet [id number]  Take 1 tablet (320 mg total) by mouth daily. Hughie Closs, MD  Active   venlafaxine XR (EFFEXOR-XR) 150 MG 24 hr capsule [id number] No Take 1 capsule (150 mg total) by mouth daily. Willow Ora, MD 05/09/2023 Active Spouse/Significant Other            Home Care and Equipment/Supplies: Were Home Health Services Ordered?: NA Any new equipment or medical supplies ordered?:  NA  Functional Questionnaire: Do you need assistance with bathing/showering or dressing?: No Do you need assistance with meal preparation?: No Do  you need assistance with eating?: No Do you have difficulty maintaining continence: No Do you need assistance with getting out of bed/getting out of a chair/moving?: No Do you have difficulty managing or taking your medications?: No  Follow up appointments reviewed: PCP Follow-up appointment confirmed?: Yes Date of PCP follow-up appointment?: 05/17/23 Follow-up Provider: [facility] Follow-up appointment confirmed?: NA Do you need transportation to your follow-up appointment?: No Do you understand care options if your condition(s) worsen?: Yes-patient verbalized understanding    SIGNATURE Karena Addison, LPN [facility] Nurse Health Advisor Direct Dial [phone]

## 2023-05-17 ENCOUNTER — Encounter: Payer: Self-pay | Admitting: Family Medicine

## 2023-05-17 ENCOUNTER — Ambulatory Visit: Admitting: Family Medicine

## 2023-05-17 VITALS — BP 120/66 | HR 78 | Temp 98.2°F | Ht 68.0 in | Wt 187.6 lb

## 2023-05-17 DIAGNOSIS — R350 Frequency of micturition: Secondary | ICD-10-CM

## 2023-05-17 DIAGNOSIS — R112 Nausea with vomiting, unspecified: Secondary | ICD-10-CM | POA: Diagnosis not present

## 2023-05-17 DIAGNOSIS — N3000 Acute cystitis without hematuria: Secondary | ICD-10-CM | POA: Diagnosis not present

## 2023-05-17 DIAGNOSIS — E039 Hypothyroidism, unspecified: Secondary | ICD-10-CM | POA: Diagnosis not present

## 2023-05-17 DIAGNOSIS — N179 Acute kidney failure, unspecified: Secondary | ICD-10-CM

## 2023-05-17 DIAGNOSIS — T50905D Adverse effect of unspecified drugs, medicaments and biological substances, subsequent encounter: Secondary | ICD-10-CM

## 2023-05-17 LAB — URINALYSIS, MICROSCOPIC ONLY

## 2023-05-17 LAB — POCT URINALYSIS DIPSTICK
Bilirubin, UA: NEGATIVE
Glucose, UA: POSITIVE — AB
Ketones, UA: NEGATIVE
Nitrite, UA: POSITIVE
Protein, UA: POSITIVE — AB
Spec Grav, UA: 1.02 (ref 1.010–1.025)
Urobilinogen, UA: 0.2 U/dL
pH, UA: 6 (ref 5.0–8.0)

## 2023-05-17 LAB — CBC WITH DIFFERENTIAL/PLATELET
Basophils Absolute: 0.1 10*3/uL (ref 0.0–0.1)
Basophils Relative: 0.5 % (ref 0.0–3.0)
Eosinophils Absolute: 0.4 10*3/uL (ref 0.0–0.7)
Eosinophils Relative: 3.4 % (ref 0.0–5.0)
HCT: 46.5 % — ABNORMAL HIGH (ref 36.0–46.0)
Hemoglobin: 15.1 g/dL — ABNORMAL HIGH (ref 12.0–15.0)
Lymphocytes Relative: 22.5 % (ref 12.0–46.0)
Lymphs Abs: 2.4 10*3/uL (ref 0.7–4.0)
MCHC: 32.4 g/dL (ref 30.0–36.0)
MCV: 96.7 fl (ref 78.0–100.0)
Monocytes Absolute: 0.9 10*3/uL (ref 0.1–1.0)
Monocytes Relative: 8 % (ref 3.0–12.0)
Neutro Abs: 7 10*3/uL (ref 1.4–7.7)
Neutrophils Relative %: 65.6 % (ref 43.0–77.0)
Platelets: 293 10*3/uL (ref 150.0–400.0)
RBC: 4.81 Mil/uL (ref 3.87–5.11)
RDW: 12.9 % (ref 11.5–15.5)
WBC: 10.7 10*3/uL — ABNORMAL HIGH (ref 4.0–10.5)

## 2023-05-17 LAB — BASIC METABOLIC PANEL
BUN: 16 mg/dL (ref 6–23)
CO2: 28 meq/L (ref 19–32)
Calcium: 9.9 mg/dL (ref 8.4–10.5)
Chloride: 104 meq/L (ref 96–112)
Creatinine, Ser: 1.13 mg/dL (ref 0.40–1.20)
GFR: 46.58 mL/min — ABNORMAL LOW (ref >60.00)
Glucose, Bld: 102 mg/dL — ABNORMAL HIGH (ref 70–99)
Potassium: 4.7 meq/L (ref 3.5–5.1)
Sodium: 139 meq/L (ref 135–145)

## 2023-05-17 LAB — TSH: TSH: 22.67 u[IU]/mL — ABNORMAL HIGH (ref 0.35–5.50)

## 2023-05-17 LAB — LIPASE: Lipase: 133 U/L — ABNORMAL HIGH (ref 11.0–59.0)

## 2023-05-17 MED ORDER — ONDANSETRON 4 MG PO TBDP: 4.0000 mg | ORAL_TABLET | Freq: Three times a day (TID) | ORAL | 1 refills | Status: AC | PRN

## 2023-05-17 MED ORDER — CEPHALEXIN 500 MG PO CAPS: 500.0000 mg | ORAL_CAPSULE | Freq: Two times a day (BID) | ORAL | 0 refills | Status: DC

## 2023-05-17 NOTE — Patient Instructions (Signed)
 Please return in 6 months   I will release your lab results to you on your MyChart account with further instructions. You may see the results before I do, but when I review them I will send you a message with my report or have my assistant call you if things need to be discussed. Please reply to my message with any questions. Thank you!   If you have any questions or concerns, please don't hesitate to send me a message via MyChart or call the office at [phone]. Thank you for visiting with [place] today! It's our pleasure caring for you.

## 2023-05-17 NOTE — Progress Notes (Signed)
 Subjective  CC:  Chief Complaint  Patient presents with   Hospitalization Follow-up    05/12/2023 - 05/14/2023 (2 days) [facility]   Intractable vomiting   Urinary Frequency    Burning, smell, and urgency to urinate since yesterday    HPI: Susan Hunt is a 78 y.o. female who presents to the office today to address the problems listed above in the chief complaint. 79 year old female with complicated past medical history including heart failure, interstitial lung disease oxygen dependent at night, diet-controlled diabetes, hypertension, hypothyroidism is here for hospital follow-up.  I reviewed all notes from the hospitalization as noted above.  Admitted after taking her second dose of Ozempic.Marland Kitchen  She had been on Wegovy without problems.  However she did not tolerate Ozempic with immediate projectile vomiting that persisted.  She was admitted for intractable nausea vomiting and dehydration.  She had mild acute renal failure.  Chest x-ray showed pulmonary vascular congestion, no frank heart failure.  She was monitored closely.  I reviewed her lab work.  She did not receive many IV fluids but was calm down on antiemetics.  Since she has been home, she is better, no longer vomiting but still with some nausea.  Requesting more Zofran.  She has been trying to hydrate with water, 5-6 bottles of water daily.  She still feels thirsty.  Diabetic control is good with an A1c at 6.3.  No more abdominal pain.  No bloating.  Still feeling fatigued.  Ozempic has been stopped.  Indication was heart failure. Complains of urinary frequency and mild dysuria that started about 24 hours ago.  No hematuria.  No fevers or chills.  No flank pain Hypothyroidism: Due for recheck.  We had decreased her dose from 125 mcg to 112 mcg in October.  Assessment  1. Intractable nausea and vomiting   2. Frequent urination   3. Acute cystitis without hematuria   4. Adverse effect of drug, subsequent encounter   5.  Prerenal acute renal failure (HCC)   6. Acquired hypothyroidism      Plan  Intractable nausea and vomiting secondary to medication side effect: Stop Ozempic.  Recheck lab work including renal function, lipase and blood count.  Continue oral hydration.  Refill Zofran.  Should improve over the next week.  She will follow-up with me if it does not. Urinary tract infection suggested by urinary dipstick and clinical symptoms.  Keflex ordered.  Follow-up on culture and microscopy. Monitor renal function.  Should be improved with oral hydration Recheck thyroid levels on 112 mcg levothyroxine daily.    Follow up: 6 months for recheck Visit date not found  Orders Placed This Encounter  Procedures   Urine Culture   Basic metabolic panel   CBC with Differential/Platelet   TSH   Lipase   Urine Microscopic Only   POCT Urinalysis Dipstick   Meds ordered this encounter  Medications   cephALEXin (KEFLEX) 500 MG capsule    Sig: Take 1 capsule (500 mg total) by mouth 2 (two) times daily.    Dispense:  14 capsule    Refill:  0   ondansetron (ZOFRAN-ODT) 4 MG disintegrating tablet    Sig: Take 1 tablet (4 mg total) by mouth every 8 (eight) hours as needed for nausea or vomiting.    Dispense:  20 tablet    Refill:  1      I reviewed the patients updated PMH, FH, and SocHx.    Patient Active Problem List  Diagnosis Date Noted   ILD (interstitial lung disease) (HCC) 11/01/2019    Priority: High   HFrEF (heart failure with reduced ejection fraction) (HCC) 11/01/2019    Priority: High   Controlled type 2 diabetes mellitus without complication, without long-term current use of insulin (HCC) 07/19/2019    Priority: High   Tubular adenoma of colon 03/26/2019    Priority: High   Panlobular emphysema (HCC) 06/17/2014    Priority: High   Major depression, recurrent, chronic (HCC) 01/28/2014    Priority: High   Obesity (BMI 30.0-34.9) 07/15/2013    Priority: High   Acquired hypothyroidism  04/11/2007    Priority: High   Mixed hyperlipidemia 04/11/2007    Priority: High   Hypertension associated with diabetes (HCC) 04/11/2007    Priority: High   Recurrent UTI 04/12/2019    Priority: Medium    Hepatic steatosis 03/26/2019    Priority: Medium    Osteopenia of femoral neck 05/24/2016    Priority: Medium    RENAL CALCULUS, HX OF 04/11/2007    Priority: Medium    ACE-inhibitor cough 10/02/2017    Priority: Low   Postmenopausal symptoms 02/14/2017    Priority: Low   Grade II hemorrhoids 05/03/2016    Priority: Low   Mild obstructive sleep apnea 05/06/2014    Priority: Low   Allergic rhinitis 12/18/2009    Priority: Low   Rosacea 04/11/2007    Priority: Low   URINARY INCONTINENCE 04/11/2007    Priority: Low   Mallory-Weiss tear 05/14/2023   Intractable vomiting 05/12/2023   Research study patient 06/24/2020   Chronic vertigo 07/19/2019   Current Meds  Medication Sig   amLODipine (NORVASC) 5 MG tablet Take 1 tablet (5 mg total) by mouth daily.   carvedilol (COREG) 25 MG tablet Take 1 tablet (25 mg total) by mouth 2 (two) times daily.   cephALEXin (KEFLEX) 500 MG capsule Take 1 capsule (500 mg total) by mouth 2 (two) times daily.   cetirizine (ZYRTEC) 10 MG tablet Take 10 mg by mouth daily.   FARXIGA 10 MG TABS tablet Take 1 tablet (10 mg total) by mouth daily.   levothyroxine (SYNTHROID) 112 MCG tablet Take 1 tablet (112 mcg total) by mouth daily.   montelukast (SINGULAIR) 10 MG tablet TAKE 1 TABLET BY MOUTH EVERY NIGHT AT BEDTIME   Multiple Vitamin (MULTIVITAMIN WITH MINERALS) TABS tablet Take 1 tablet by mouth daily.   omeprazole (PRILOSEC) 40 MG capsule Take 1 capsule (40 mg total) by mouth daily for 28 days.   Pirfenidone (ESBRIET) 801 MG TABS Take 1 tablet (801 mg total) by mouth with breakfast, with lunch, and with evening meal.   rosuvastatin (CRESTOR) 10 MG tablet TAKE 1 TABLET BY MOUTH DAILY (Patient taking differently: Take 10 mg by mouth every evening.)    Semaglutide, 2 MG/DOSE, (OZEMPIC, 2 MG/DOSE,) 8 MG/3ML SOPN Inject 2 mg into the skin once a week.   spironolactone (ALDACTONE) 25 MG tablet Take 1 tablet (25 mg total) by mouth daily.   valsartan (DIOVAN) 320 MG tablet Take 1 tablet (320 mg total) by mouth daily.   venlafaxine XR (EFFEXOR-XR) 150 MG 24 hr capsule Take 1 capsule (150 mg total) by mouth daily.   [DISCONTINUED] ondansetron (ZOFRAN-ODT) 4 MG disintegrating tablet DISSOLVE ONE TABLET BY MOUTH EVERY 8 HOURS AS NEEDED FOR NAUSEA/ VOMITING    Allergies: Patient is allergic to ace inhibitors, clarithromycin, codeine, sulfamethoxazole, and telmisartan. Family History: Patient family history includes Alcohol abuse in her father; Diabetes in  her maternal grandmother; Drug abuse in her son; Heart disease in her mother; Hypertension in her mother; Osteoporosis in her mother; Stroke in her mother. Social History:  Patient  reports that she quit smoking about 9 years ago. Her smoking use included cigarettes. She started smoking about 24 years ago. She has a 7.5 pack-year smoking history. She has never used smokeless tobacco. She reports that she does not currently use alcohol. She reports that she does not use drugs.  Review of Systems: Constitutional: Negative for fever malaise or anorexia Cardiovascular: negative for chest pain Respiratory: negative for SOB or persistent cough Gastrointestinal: negative for abdominal pain  Objective  Vitals: BP 120/66   Pulse 78   Temp 98.2 F (36.8 C)   Ht 5\' 8"  (1.727 m)   Wt 187 lb 9.6 oz (85.1 kg)   SpO2 90%   BMI 28.52 kg/m  General: no acute distress , A&Ox3, appears well, HEENT: PEERL, conjunctiva normal, neck is supple dry mucosa Cardiovascular:  RRR Respiratory:  Good breath sounds bilaterally, CTAB with normal respiratory effort Gastrointestinal: soft, flat abdomen, normal active bowel sounds, no palpable masses, no hepatosplenomegaly, no appreciated hernias Nontender, no  suprapubic tenderness, no flank tenderness Skin:  Warm, no rashes  Office Visit on 05/17/2023  Component Date Value Ref Range Status   Color, UA 05/17/2023 dark yellow   Final   Clarity, UA 05/17/2023 cloudy   Final   Glucose, UA 05/17/2023 Positive (A)  Negative Final   Bilirubin, UA 05/17/2023 negative   Final   Ketones, UA 05/17/2023 negative   Final   Spec Grav, UA 05/17/2023 1.020  1.010 - 1.025 Final   Blood, UA 05/17/2023 1+ (A)   Final   pH, UA 05/17/2023 6.0  5.0 - 8.0 Final   Protein, UA 05/17/2023 Positive (A)  Negative Final   Urobilinogen, UA 05/17/2023 0.2  0.2 or 1.0 E.U./dL Final   Nitrite, UA 57/84/6962 positive   Final   Leukocytes, UA 05/17/2023 Large (3+) (A)  Negative Final    Commons side effects, risks, benefits, and alternatives for medications and treatment plan prescribed today were discussed, and the patient expressed understanding of the given instructions. Patient is instructed to call or message via MyChart if he/she has any questions or concerns regarding our treatment plan. No barriers to understanding were identified. We discussed Red Flag symptoms and signs in detail. Patient expressed understanding regarding what to do in case of urgent or emergency type symptoms.  Medication list was reconciled, printed and provided to the patient in AVS. Patient instructions and summary information was reviewed with the patient as documented in the AVS. This note was prepared with assistance of Dragon voice recognition software. Occasional wrong-word or sound-a-like substitutions may have occurred due to the inherent limitations of voice recognition software

## 2023-05-18 ENCOUNTER — Encounter: Payer: Self-pay | Admitting: Family Medicine

## 2023-05-19 ENCOUNTER — Encounter: Payer: Self-pay | Admitting: Family Medicine

## 2023-05-19 LAB — URINE CULTURE
MICRO NUMBER:: 16162496
SPECIMEN QUALITY:: ADEQUATE

## 2023-05-19 NOTE — Progress Notes (Signed)
 See mychart note Dear Ms. Bickham, Multiple issues as you can see: Your urine is infected; the keflex should cure it but please let me know if your symptoms are not improved by Monday and I will change the antibiotic.   Your thyroid is now undertreated and yes, you are feeling the effects. We lowered your levothyroxine dose in late October, but it is too low. I will reorder the dose to be taken M-F and please continue the dose on Saturday and Sundays. Please schedule a follow up visit with me in 6-8 weeks so I can recheck how you are doing and recheck your lab work.   Your lipase is very high now: it was normal when first checked in the hospital; you may likely have had pancreatitis from the ozempic which contributed to your symptoms. Let me know if you have more abdominal pain, eat a bland diet. Your CT is the hospital showed a normal appearing pancreas so this should resolve with time.   I'm sorry you were so sick; please return to see me in 6-8 weeks so we can get everything improved/resolved. Let me know if anything else comes up.  Sincerely, Dr. Mardelle Matte

## 2023-05-22 ENCOUNTER — Inpatient Hospital Stay: Admitting: Family Medicine

## 2023-06-12 ENCOUNTER — Other Ambulatory Visit: Payer: Self-pay

## 2023-06-13 ENCOUNTER — Other Ambulatory Visit: Payer: Self-pay | Admitting: Pharmacy Technician

## 2023-06-13 ENCOUNTER — Other Ambulatory Visit (HOSPITAL_COMMUNITY): Payer: Self-pay

## 2023-06-13 ENCOUNTER — Other Ambulatory Visit: Payer: Self-pay

## 2023-06-13 NOTE — Progress Notes (Signed)
 Specialty Pharmacy Refill Coordination Note  Susan Hunt is a 78 y.o. female contacted today regarding refills of specialty medication(s) Pirfenidone   Patient requested Delivery   Delivery date: 06/15/23   Verified address: [address]  [place]   Medication will be filled on 06/14/23.

## 2023-06-21 ENCOUNTER — Encounter: Payer: Self-pay | Admitting: Family Medicine

## 2023-06-21 ENCOUNTER — Ambulatory Visit (INDEPENDENT_AMBULATORY_CARE_PROVIDER_SITE_OTHER): Admitting: Family Medicine

## 2023-06-21 VITALS — BP 112/68 | HR 69 | Temp 97.7°F | Ht 68.0 in | Wt 182.4 lb

## 2023-06-21 DIAGNOSIS — L304 Erythema intertrigo: Secondary | ICD-10-CM

## 2023-06-21 DIAGNOSIS — I152 Hypertension secondary to endocrine disorders: Secondary | ICD-10-CM

## 2023-06-21 DIAGNOSIS — E1159 Type 2 diabetes mellitus with other circulatory complications: Secondary | ICD-10-CM

## 2023-06-21 DIAGNOSIS — E039 Hypothyroidism, unspecified: Secondary | ICD-10-CM

## 2023-06-21 DIAGNOSIS — K853 Drug induced acute pancreatitis without necrosis or infection: Secondary | ICD-10-CM

## 2023-06-21 DIAGNOSIS — Z7985 Long-term (current) use of injectable non-insulin antidiabetic drugs: Secondary | ICD-10-CM

## 2023-06-21 LAB — LIPASE: Lipase: 40 U/L (ref 11.0–59.0)

## 2023-06-21 LAB — CBC WITH DIFFERENTIAL/PLATELET
Basophils Absolute: 0 10*3/uL (ref 0.0–0.1)
Basophils Relative: 0.4 % (ref 0.0–3.0)
Eosinophils Absolute: 0.5 10*3/uL (ref 0.0–0.7)
Eosinophils Relative: 4.6 % (ref 0.0–5.0)
HCT: 45.2 % (ref 36.0–46.0)
Hemoglobin: 14.9 g/dL (ref 12.0–15.0)
Lymphocytes Relative: 25.3 % (ref 12.0–46.0)
Lymphs Abs: 2.6 10*3/uL (ref 0.7–4.0)
MCHC: 32.9 g/dL (ref 30.0–36.0)
MCV: 96.3 fl (ref 78.0–100.0)
Monocytes Absolute: 0.9 10*3/uL (ref 0.1–1.0)
Monocytes Relative: 9.2 % (ref 3.0–12.0)
Neutro Abs: 6.2 10*3/uL (ref 1.4–7.7)
Neutrophils Relative %: 60.5 % (ref 43.0–77.0)
Platelets: 300 10*3/uL (ref 150.0–400.0)
RBC: 4.69 Mil/uL (ref 3.87–5.11)
RDW: 13.4 % (ref 11.5–15.5)
WBC: 10.2 10*3/uL (ref 4.0–10.5)

## 2023-06-21 LAB — COMPREHENSIVE METABOLIC PANEL WITH GFR
ALT: 13 U/L (ref 0–35)
AST: 20 U/L (ref 0–37)
Albumin: 4.5 g/dL (ref 3.5–5.2)
Alkaline Phosphatase: 85 U/L (ref 39–117)
BUN: 17 mg/dL (ref 6–23)
CO2: 27 meq/L (ref 19–32)
Calcium: 9.5 mg/dL (ref 8.4–10.5)
Chloride: 104 meq/L (ref 96–112)
Creatinine, Ser: 1.05 mg/dL (ref 0.40–1.20)
GFR: 50.83 mL/min — ABNORMAL LOW (ref >60.00)
Glucose, Bld: 105 mg/dL — ABNORMAL HIGH (ref 70–99)
Potassium: 4 meq/L (ref 3.5–5.1)
Sodium: 139 meq/L (ref 135–145)
Total Bilirubin: 0.4 mg/dL (ref 0.2–1.2)
Total Protein: 7.6 g/dL (ref 6.0–8.3)

## 2023-06-21 LAB — TSH: TSH: 1.52 u[IU]/mL (ref 0.35–5.50)

## 2023-06-21 MED ORDER — NYSTATIN 100000 UNIT/GM EX CREA: 1.0000 | TOPICAL_CREAM | Freq: Two times a day (BID) | CUTANEOUS | 2 refills | Status: AC

## 2023-06-21 NOTE — Patient Instructions (Signed)
 Please return in October for your annual complete physical; please come fasting.   I will release your lab results to you on your MyChart account with further instructions. You may see the results before I do, but when I review them I will send you a message with my report or have my assistant call you if things need to be discussed. Please reply to my message with any questions. Thank you!   If you have any questions or concerns, please don't hesitate to send me a message via MyChart or call the office at [phone]. Thank you for visiting with [place] today! It's our pleasure caring for you.   VISIT SUMMARY:  During today's visit, we reviewed your thyroid medication and discussed the management of your persistent rash. You reported feeling much better with increased energy and less fatigue. We also addressed your concerns about the rash under your breasts and discussed the next steps for your family member's care.  YOUR PLAN:  -HYPOTHYROIDISM: Hypothyroidism is a condition where your thyroid gland does not produce enough thyroid hormone, leading to symptoms like fatigue and lethargy. Your management has improved, and you are experiencing increased energy and less fatigue. We will recheck your thyroid levels to ensure your current treatment is effective.  -INTERTRIGO: Intertrigo is a rash that occurs in skin folds, often due to sweating and friction. You have a recurrent rash under your breasts that is red and itchy. We will refill the prescription for the cream that previously helped, and you should continue using powders like Gold Bond, corn starch, or baby powder to manage sweating.  -GOALS OF CARE: You are considering making a family member a ward of the state due to financial constraints and the need for dementia care. We will provide a doctor's statement indicating the need for supervised care for your family member.  INSTRUCTIONS:  Please schedule a follow-up appointment in October for a  physical. We will also recheck your thyroid levels at that time.

## 2023-06-21 NOTE — Progress Notes (Signed)
 Subjective  CC:  Chief Complaint  Patient presents with   Follow-up   Hypothyroidism    HPI: Susan Hunt is a 78 y.o. female who presents to the office today to address the problems listed above in the chief complaint. Discussed the use of AI scribe software for clinical note transcription with the patient, who gave verbal consent to proceed.  History of Present Illness   Susan Hunt "Pam" is a 78 year old female who presents for hypothyroidism, n/v and pancreatitis, and rash.  She feels much better with increased energy and less lethargy. She no longer experiences constant fatigue and is not always wanting to sleep. She confirms taking her thyroid medication as prescribed. We adjusted up her thyroid dose due to last tsh being 22. Weight is down. Doing much better  Ozempic n/v and pancreatitis: back to normal now. No longer w/ any gi sxs. No n/v/ or pain.   She has a persistent rash under her breasts, which is red and constantly itchy. She uses cortisone cream for temporary relief, but it sometimes does not last through the night. She recalls a previously prescribed cream that helped the rash resolve for a few days. She has tried various powders such as Gold Bond, corn starch, and baby powder without effectiveness. She believes the rash may be related to sweating. No current stomach pain, diarrhea, or nausea. Her breathing is okay.       Assessment  1. Acquired hypothyroidism   2. Drug-induced acute pancreatitis without infection or necrosis   3. Hypertension associated with diabetes (HCC)   4. Intertrigo      Plan  Assessment and Plan    Hypothyroidism Improved management with increased energy and reduced lethargy. Slight weight loss may be related to improved thyroid function. - Recheck thyroid levels to assess current management.  Intertrigo Recurrent rash under the breasts with redness and itching, likely due to sweating. Relieved by intermittent use of a  cream, possibly antifungal or steroid. - Refill prescription for the nystatin cream used to treat the rash. - Advise use of powders such as Gold Bond, corn starch, or baby powder to manage sweating.  Ozempic related n/v and pancreatitis: resolved. Recheck lipase and renal function and cbc  Follow-up Scheduled for a follow-up physical in October. - Schedule follow-up appointment in October for a physical.        Orders Placed This Encounter  Procedures   Lipase   TSH   CBC with Differential/Platelet   Comprehensive metabolic panel with GFR   Meds ordered this encounter  Medications   nystatin cream (MYCOSTATIN)    Sig: Apply 1 Application topically 2 (two) times daily.    Dispense:  30 g    Refill:  2     I reviewed the patients updated PMH, FH, and SocHx.    Patient Active Problem List   Diagnosis Date Noted   ILD (interstitial lung disease) (HCC) 11/01/2019    Priority: High   HFrEF (heart failure with reduced ejection fraction) (HCC) 11/01/2019    Priority: High   Controlled type 2 diabetes mellitus without complication, without long-term current use of insulin (HCC) 07/19/2019    Priority: High   Tubular adenoma of colon 03/26/2019    Priority: High   Panlobular emphysema (HCC) 06/17/2014    Priority: High   Major depression, recurrent, chronic (HCC) 01/28/2014    Priority: High   Obesity (BMI 30.0-34.9) 07/15/2013    Priority: High   Acquired hypothyroidism  04/11/2007    Priority: High   Mixed hyperlipidemia 04/11/2007    Priority: High   Hypertension associated with diabetes (HCC) 04/11/2007    Priority: High   Recurrent UTI 04/12/2019    Priority: Medium    Hepatic steatosis 03/26/2019    Priority: Medium    Osteopenia of femoral neck 05/24/2016    Priority: Medium    RENAL CALCULUS, HX OF 04/11/2007    Priority: Medium    ACE-inhibitor cough 10/02/2017    Priority: Low   Postmenopausal symptoms 02/14/2017    Priority: Low   Grade II hemorrhoids  05/03/2016    Priority: Low   Mild obstructive sleep apnea 05/06/2014    Priority: Low   Allergic rhinitis 12/18/2009    Priority: Low   Rosacea 04/11/2007    Priority: Low   URINARY INCONTINENCE 04/11/2007    Priority: Low   Mallory-Weiss tear 05/14/2023   Intractable vomiting 05/12/2023   Research study patient 06/24/2020   Chronic vertigo 07/19/2019   Current Meds  Medication Sig   amLODipine (NORVASC) 5 MG tablet Take 1 tablet (5 mg total) by mouth daily.   carvedilol (COREG) 25 MG tablet Take 1 tablet (25 mg total) by mouth 2 (two) times daily.   cetirizine (ZYRTEC) 10 MG tablet Take 10 mg by mouth daily.   FARXIGA 10 MG TABS tablet Take 1 tablet (10 mg total) by mouth daily.   levothyroxine (SYNTHROID) 112 MCG tablet Take 1 tablet (112 mcg total) by mouth daily.   montelukast (SINGULAIR) 10 MG tablet TAKE 1 TABLET BY MOUTH EVERY NIGHT AT BEDTIME   Multiple Vitamin (MULTIVITAMIN WITH MINERALS) TABS tablet Take 1 tablet by mouth daily.   nystatin cream (MYCOSTATIN) Apply 1 Application topically 2 (two) times daily.   ondansetron (ZOFRAN-ODT) 4 MG disintegrating tablet Take 1 tablet (4 mg total) by mouth every 8 (eight) hours as needed for nausea or vomiting.   Pirfenidone (ESBRIET) 801 MG TABS Take 1 tablet (801 mg total) by mouth with breakfast, with lunch, and with evening meal.   rosuvastatin (CRESTOR) 10 MG tablet TAKE 1 TABLET BY MOUTH DAILY (Patient taking differently: Take 10 mg by mouth every evening.)   spironolactone (ALDACTONE) 25 MG tablet Take 1 tablet (25 mg total) by mouth daily.   valsartan (DIOVAN) 320 MG tablet Take 1 tablet (320 mg total) by mouth daily.   venlafaxine XR (EFFEXOR-XR) 150 MG 24 hr capsule Take 1 capsule (150 mg total) by mouth daily.   [DISCONTINUED] cephALEXin (KEFLEX) 500 MG capsule Take 1 capsule (500 mg total) by mouth 2 (two) times daily.    Allergies: Patient is allergic to ace inhibitors, clarithromycin, codeine, sulfamethoxazole, and  telmisartan. Family History: Patient family history includes Alcohol abuse in her father; Diabetes in her maternal grandmother; Drug abuse in her son; Heart disease in her mother; Hypertension in her mother; Osteoporosis in her mother; Stroke in her mother. Social History:  Patient  reports that she quit smoking about 9 years ago. Her smoking use included cigarettes. She started smoking about 24 years ago. She has a 7.5 pack-year smoking history. She has never used smokeless tobacco. She reports that she does not currently use alcohol. She reports that she does not use drugs.  Review of Systems: Constitutional: Negative for fever malaise or anorexia Cardiovascular: negative for chest pain Respiratory: negative for SOB or persistent cough Gastrointestinal: negative for abdominal pain  Objective  Vitals: BP 112/68   Pulse 69   Temp 97.7 F (36.5 C)  Ht 5\' 8"  (1.727 m)   Wt 182 lb 6.4 oz (82.7 kg)   SpO2 93%   BMI 27.73 kg/m  General: no acute distress , A&Ox3 HEENT: PEERL, conjunctiva normal, neck is supple Cardiovascular:  RRR without murmur or gallop.  Respiratory:  distant breath sounds bilaterally, CTAB with normal respiratory effort Skin:  Warm, minimal red rash at bra line anteriorly  Commons side effects, risks, benefits, and alternatives for medications and treatment plan prescribed today were discussed, and the patient expressed understanding of the given instructions. Patient is instructed to call or message via MyChart if he/she has any questions or concerns regarding our treatment plan. No barriers to understanding were identified. We discussed Red Flag symptoms and signs in detail. Patient expressed understanding regarding what to do in case of urgent or emergency type symptoms.  Medication list was reconciled, printed and provided to the patient in AVS. Patient instructions and summary information was reviewed with the patient as documented in the AVS. This note was  prepared with assistance of Dragon voice recognition software. Occasional wrong-word or sound-a-like substitutions may have occurred due to the inherent limitations of voice recognition software

## 2023-06-28 DIAGNOSIS — N3001 Acute cystitis with hematuria: Secondary | ICD-10-CM | POA: Diagnosis not present

## 2023-06-28 DIAGNOSIS — N390 Urinary tract infection, site not specified: Secondary | ICD-10-CM | POA: Diagnosis not present

## 2023-07-01 ENCOUNTER — Other Ambulatory Visit: Payer: Self-pay | Admitting: Internal Medicine

## 2023-07-03 ENCOUNTER — Encounter: Payer: Self-pay | Admitting: Family Medicine

## 2023-07-03 ENCOUNTER — Other Ambulatory Visit: Payer: Self-pay | Admitting: Family Medicine

## 2023-07-03 ENCOUNTER — Other Ambulatory Visit: Payer: Self-pay | Admitting: Family

## 2023-07-03 MED ORDER — LEVOTHYROXINE SODIUM 112 MCG PO TABS: ORAL_TABLET | ORAL | 1 refills | Status: DC

## 2023-07-03 MED ORDER — LEVOTHYROXINE SODIUM 125 MCG PO TABS
ORAL_TABLET | ORAL | Status: DC
Start: 2023-07-03 — End: 2023-07-03

## 2023-07-03 MED ORDER — LEVOTHYROXINE SODIUM 125 MCG PO TABS: ORAL_TABLET | ORAL | 1 refills | Status: DC

## 2023-07-03 MED ORDER — LEVOTHYROXINE SODIUM 112 MCG PO TABS: ORAL_TABLET | ORAL | Status: DC

## 2023-07-03 MED ORDER — LEVOTHYROXINE SODIUM 125 MCG PO TABS: 125.0000 ug | ORAL_TABLET | Freq: Every day | ORAL | 3 refills | Status: DC

## 2023-07-03 NOTE — Addendum Note (Signed)
 Addended by: Shelton Dibbles A on: 07/03/2023 03:19 PM   Modules accepted: Orders

## 2023-07-03 NOTE — Progress Notes (Signed)
 Please call patient: thyroid  is now in range; please clarify her dose. She should be on 125mcg M-F and 112mcg sat and Sunday.  If so, please correct dosing on her medication list.  If not, please let me know what she is taking.

## 2023-07-04 ENCOUNTER — Other Ambulatory Visit (HOSPITAL_COMMUNITY): Payer: Self-pay

## 2023-07-05 ENCOUNTER — Telehealth: Payer: Self-pay | Admitting: Pharmacy Technician

## 2023-07-05 ENCOUNTER — Encounter: Payer: Self-pay | Admitting: Pharmacy Technician

## 2023-07-05 NOTE — Telephone Encounter (Signed)
 Sent patient confirmation of her medication shipment from [facility]   We received shipment notification from [facility] that they shipped your medication on 07/04/23 with tracking number: [id number] to: [address], [place], [place], [address]. They said to allow 7-10 business days from shipment to arrive.

## 2023-07-06 ENCOUNTER — Other Ambulatory Visit: Payer: Self-pay

## 2023-07-06 ENCOUNTER — Other Ambulatory Visit (HOSPITAL_COMMUNITY): Payer: Self-pay

## 2023-07-06 NOTE — Progress Notes (Signed)
 Specialty Pharmacy Refill Coordination Note  Susan Hunt is a 78 y.o. female contacted today regarding refills of specialty medication(s) Pirfenidone    Patient requested Delivery   Delivery date: 07/10/23   Verified address: [address]   [place] [address]   Medication will be filled on 07/07/23.

## 2023-07-07 ENCOUNTER — Other Ambulatory Visit (HOSPITAL_COMMUNITY): Payer: Self-pay

## 2023-07-07 ENCOUNTER — Other Ambulatory Visit: Payer: Self-pay

## 2023-07-10 ENCOUNTER — Other Ambulatory Visit: Payer: Self-pay

## 2023-07-10 MED ORDER — VALSARTAN 320 MG PO TABS: 320.0000 mg | ORAL_TABLET | Freq: Every day | ORAL | 0 refills | Status: DC

## 2023-07-29 ENCOUNTER — Other Ambulatory Visit: Payer: Self-pay | Admitting: Family Medicine

## 2023-08-02 ENCOUNTER — Other Ambulatory Visit (HOSPITAL_COMMUNITY): Payer: Self-pay

## 2023-08-04 ENCOUNTER — Other Ambulatory Visit: Payer: Self-pay

## 2023-08-04 ENCOUNTER — Other Ambulatory Visit (HOSPITAL_COMMUNITY): Payer: Self-pay

## 2023-08-04 NOTE — Progress Notes (Signed)
 Specialty Pharmacy Refill Coordination Note  Spoke with patient's husband [name].   Susan Hunt is a 78 y.o. female contacted today regarding refills of specialty medication(s) Pirfenidone    Patient requested Delivery   Delivery date: 08/11/23   Verified address: [address]   [place] [address]   Medication will be filled on 08/10/23.

## 2023-08-10 ENCOUNTER — Other Ambulatory Visit: Payer: Self-pay

## 2023-08-11 ENCOUNTER — Other Ambulatory Visit: Payer: Self-pay

## 2023-08-13 ENCOUNTER — Encounter: Payer: Self-pay | Admitting: Internal Medicine

## 2023-08-17 DIAGNOSIS — L244 Irritant contact dermatitis due to drugs in contact with skin: Secondary | ICD-10-CM | POA: Diagnosis not present

## 2023-08-17 DIAGNOSIS — L814 Other melanin hyperpigmentation: Secondary | ICD-10-CM | POA: Diagnosis not present

## 2023-08-17 DIAGNOSIS — L820 Inflamed seborrheic keratosis: Secondary | ICD-10-CM | POA: Diagnosis not present

## 2023-08-17 DIAGNOSIS — Z85828 Personal history of other malignant neoplasm of skin: Secondary | ICD-10-CM | POA: Diagnosis not present

## 2023-08-17 DIAGNOSIS — Z872 Personal history of diseases of the skin and subcutaneous tissue: Secondary | ICD-10-CM | POA: Diagnosis not present

## 2023-08-17 DIAGNOSIS — L538 Other specified erythematous conditions: Secondary | ICD-10-CM | POA: Diagnosis not present

## 2023-08-17 DIAGNOSIS — L233 Allergic contact dermatitis due to drugs in contact with skin: Secondary | ICD-10-CM | POA: Diagnosis not present

## 2023-08-17 DIAGNOSIS — L570 Actinic keratosis: Secondary | ICD-10-CM | POA: Diagnosis not present

## 2023-08-17 DIAGNOSIS — D225 Melanocytic nevi of trunk: Secondary | ICD-10-CM | POA: Diagnosis not present

## 2023-08-17 DIAGNOSIS — Z08 Encounter for follow-up examination after completed treatment for malignant neoplasm: Secondary | ICD-10-CM | POA: Diagnosis not present

## 2023-08-17 DIAGNOSIS — L821 Other seborrheic keratosis: Secondary | ICD-10-CM | POA: Diagnosis not present

## 2023-08-21 ENCOUNTER — Telehealth: Payer: Self-pay | Admitting: Internal Medicine

## 2023-08-21 NOTE — Telephone Encounter (Signed)
 PT dropped off form from [employer] for Dr. Consent using a POC on a flight. I gave her a copy. She asks that we call her when it is ready to pick up @ [phone]

## 2023-08-25 ENCOUNTER — Telehealth: Payer: Self-pay | Admitting: Pharmacy Technician

## 2023-08-25 MED ORDER — FARXIGA 10 MG PO TABS: 10.0000 mg | ORAL_TABLET | Freq: Every day | ORAL | 2 refills | Status: DC

## 2023-08-25 NOTE — Addendum Note (Signed)
 Addended by: Gayleen Kawasaki D on: 08/25/2023 03:45 PM   Modules accepted: Orders

## 2023-08-25 NOTE — Telephone Encounter (Signed)
 Hi, [facility] is saying they need a refill. Can someone send a refill to their pharmacy of : [facility] - [place], [place] - [address], [place] [address]  Phone:?[phone]??Fax:?[phone] Thank you!

## 2023-08-28 NOTE — Telephone Encounter (Signed)
**Note De-identified  [place] Obfuscation** Please advise 

## 2023-09-04 NOTE — Telephone Encounter (Signed)
 Letter has been placed up front for pick up and notified her via mychart. I tried calling the number listed for her and there was no answer and her voicemail greeting asked to NOT leave msgs.

## 2023-09-04 NOTE — Telephone Encounter (Signed)
 Done, see mychart encounter dated 08/13/23.

## 2023-09-06 ENCOUNTER — Other Ambulatory Visit: Payer: Self-pay | Admitting: Pharmacy Technician

## 2023-09-06 ENCOUNTER — Other Ambulatory Visit: Payer: Self-pay

## 2023-09-06 NOTE — Progress Notes (Signed)
 Specialty Pharmacy Refill Coordination Note  Susan Hunt is a 78 y.o. female contacted today regarding refills of specialty medication(s) Pirfenidone   Spoke with Husband  Patient requested Delivery   Delivery date: 09/08/23   Verified address: [address]  [place]   Medication will be filled on 09/07/23.

## 2023-09-27 DIAGNOSIS — R051 Acute cough: Secondary | ICD-10-CM | POA: Diagnosis not present

## 2023-09-27 DIAGNOSIS — R300 Dysuria: Secondary | ICD-10-CM | POA: Diagnosis not present

## 2023-09-27 DIAGNOSIS — Z20822 Contact with and (suspected) exposure to covid-19: Secondary | ICD-10-CM | POA: Diagnosis not present

## 2023-10-02 DIAGNOSIS — R051 Acute cough: Secondary | ICD-10-CM | POA: Diagnosis not present

## 2023-10-02 DIAGNOSIS — R062 Wheezing: Secondary | ICD-10-CM | POA: Diagnosis not present

## 2023-10-02 DIAGNOSIS — R0982 Postnasal drip: Secondary | ICD-10-CM | POA: Diagnosis not present

## 2023-10-02 DIAGNOSIS — U071 COVID-19: Secondary | ICD-10-CM | POA: Diagnosis not present

## 2023-10-03 ENCOUNTER — Other Ambulatory Visit: Payer: Self-pay

## 2023-10-05 ENCOUNTER — Other Ambulatory Visit: Payer: Self-pay

## 2023-10-05 ENCOUNTER — Other Ambulatory Visit (HOSPITAL_COMMUNITY): Payer: Self-pay

## 2023-10-05 MED ORDER — VALSARTAN 320 MG PO TABS: 320.0000 mg | ORAL_TABLET | Freq: Every day | ORAL | 0 refills | Status: DC

## 2023-10-05 NOTE — Progress Notes (Signed)
 Specialty Pharmacy Refill Coordination Note  Spoke with Dosher,Jeff (Husband)  Susan Hunt is a 78 y.o. female contacted today regarding refills of specialty medication(s) Pirfenidone   Doses on hand: 25 days  Patient requested: Delivery   Delivery date: 10/19/23   Verified address: [address] [place] [address]  Medication will be filled on 10/18/23.

## 2023-10-06 ENCOUNTER — Telehealth: Payer: Self-pay

## 2023-10-06 NOTE — Telephone Encounter (Signed)
 Spoke with pt. She is aware Dr Jodie is out today and wanted to see if she could be worked in on Monday.  She was seen at urgent care multiple times and nothing has helped.  Copied from CRM [id number]. Topic: Appointments - Appointment Scheduling >> Oct 06, 2023  1:01 PM Rosina BIRCH wrote: Patient/patient representative is calling to schedule an appointment. Refer to attachments for appointment information.   Patient called stating she is just getting over Covid and she has a horrible cough and she want to see if the provider can work her in today or Monday [phone]

## 2023-10-09 ENCOUNTER — Encounter: Payer: Self-pay | Admitting: Family

## 2023-10-09 ENCOUNTER — Ambulatory Visit (INDEPENDENT_AMBULATORY_CARE_PROVIDER_SITE_OTHER)

## 2023-10-09 ENCOUNTER — Ambulatory Visit (INDEPENDENT_AMBULATORY_CARE_PROVIDER_SITE_OTHER): Admitting: Family

## 2023-10-09 DIAGNOSIS — J208 Acute bronchitis due to other specified organisms: Secondary | ICD-10-CM

## 2023-10-09 DIAGNOSIS — J431 Panlobular emphysema: Secondary | ICD-10-CM

## 2023-10-09 DIAGNOSIS — U071 COVID-19: Secondary | ICD-10-CM

## 2023-10-09 DIAGNOSIS — J849 Interstitial pulmonary disease, unspecified: Secondary | ICD-10-CM | POA: Diagnosis not present

## 2023-10-09 DIAGNOSIS — I700 Atherosclerosis of aorta: Secondary | ICD-10-CM | POA: Diagnosis not present

## 2023-10-09 DIAGNOSIS — R053 Chronic cough: Secondary | ICD-10-CM | POA: Diagnosis not present

## 2023-10-09 DIAGNOSIS — I517 Cardiomegaly: Secondary | ICD-10-CM | POA: Diagnosis not present

## 2023-10-09 MED ORDER — PREDNISONE 10 MG PO TABS: ORAL_TABLET | ORAL | 0 refills | Status: DC

## 2023-10-09 MED ORDER — UMECLIDINIUM BROMIDE 62.5 MCG/ACT IN AEPB
1.0000 | INHALATION_SPRAY | Freq: Every day | RESPIRATORY_TRACT | 1 refills | Status: AC
Start: 2023-10-09 — End: ?

## 2023-10-09 NOTE — Progress Notes (Signed)
 Patient ID: Susan Hunt, female    DOB: 1945-09-10, 78 y.o.   MRN: [id number]  Chief Complaint  Patient presents with   Cough    Dx with Covid on 7/14. Sx include headache, cough and body aches. Has tried paxlovid , mucinex , doxycycline  and albuterol  inhaler  Discussed the use of AI scribe software for clinical note transcription with the patient, who gave verbal consent to proceed.  History of Present Illness Susan Hunt is a 78 year old female with interstitial lung disease who presents with persistent cough and mucus production following a COVID-19 infection dx on 09/25/2023.  Cough and sputum production - Persistent cough and mucus production since COVID-19 infection on September 25, 2023 - Cough occurs throughout the day and worsens when lying down - Cough occasionally disrupts sleep - Mucus is creamy and consistent in amount - Treated with Paxlovid , prednisone , antibiotic, cough syrup, inhaler, and Mucinex  with ongoing symptoms  Interstitial lung disease - Diagnosed with interstitial lung disease - Managed with Esbriet  801 mg three times daily - Typically just has occasional baseline cough or mucus production  - Uses home oxygen  as needed, especially overnight - Recently prescribed albuterol  inhaler, using 1 puff q4h  Postural dizziness - Experiences dizziness upon standing, attributed to low blood pressure - Takes carvedilol  twice daily, amlodipine , and valsartan  at night - Does not monitor blood pressure at home but has a cuff available  Heart failure - Diagnosed with heart failure - Takes spironolactone  - No history of ankle swelling  Recent infections - Diagnosed with COVID-19 & UTI on September 27, 2023 seen in UC & completed ten-day course of Paxlovid , 7d course of DOXY (for UTI) & prednisone  yesterday. - Treated for concurrent urinary tract infection, which has resolved  Assessment & Plan Bronchitis due to COVID-19 Persistent cough and mucus production  for two weeks post-diagnosis w/SOB, chest tightness, fatigue. Concern for pneumonia due to symptom duration and nature. Lungs clear on exam. Cough audibly deep and moist. Presence of interstitial lung disease & emphysema likely exacerbating sx. - Order chest x-ray and CBC to rule out pneumonia. - Send another round of Prednisone  taper, advised on use & SE. - Sending Incruse Ellipta , advised on use. - Drink up to 2 liters of water daily - Contact Dr. Geronimo w/pulmonology directly if sx persist or can call office back here  Hypotension/Heart Failure No edema noted today. Low blood pressure and orthostatic dizziness likely due to antihypertensive medications, current illness. Reports eating and drinking fine. Taking Coreg  bid, Valsartan  qpm and amlodipine  qpm. - Hold off on blood pressure medication doses tonight. - Be sure hydrate well and not skip meals - Advise to obtain a new blood pressure cuff for home monitoring and call office with continued low readings.    Subjective:    Outpatient Medications Prior to Visit  Medication Sig Dispense Refill   amLODipine  (NORVASC ) 5 MG tablet Take 1 tablet (5 mg total) by mouth daily. 90 tablet 3   carvedilol  (COREG ) 25 MG tablet Take 1 tablet (25 mg total) by mouth 2 (two) times daily. 180 tablet 2   cetirizine (ZYRTEC) 10 MG tablet Take 10 mg by mouth daily.     FARXIGA  10 MG TABS tablet Take 1 tablet (10 mg total) by mouth daily. 30 tablet 2   levothyroxine  (SYNTHROID ) 112 MCG tablet Take 112mcg Saturday and Sundays 24 tablet 1   levothyroxine  (SYNTHROID ) 125 MCG tablet Take 125mcg every weekday 60 tablet 1   montelukast  (  SINGULAIR ) 10 MG tablet TAKE 1 TABLET BY MOUTH EVERY NIGHT AT BEDTIME 90 tablet 1   Multiple Vitamin (MULTIVITAMIN WITH MINERALS) TABS tablet Take 1 tablet by mouth daily.     nystatin  cream (MYCOSTATIN ) Apply 1 Application topically 2 (two) times daily. 30 g 2   omeprazole  (PRILOSEC) 40 MG capsule Take 1 capsule (40 mg total)  by mouth daily for 28 days. 28 capsule 0   ondansetron  (ZOFRAN -ODT) 4 MG disintegrating tablet Take 1 tablet (4 mg total) by mouth every 8 (eight) hours as needed for nausea or vomiting. 20 tablet 1   Pirfenidone  (ESBRIET ) 801 MG TABS Take 1 tablet (801 mg total) by mouth with breakfast, with lunch, and with evening meal. 90 tablet 5   rosuvastatin  (CRESTOR ) 10 MG tablet TAKE 1 TABLET BY MOUTH DAILY 90 tablet 3   spironolactone  (ALDACTONE ) 25 MG tablet Take 1 tablet (25 mg total) by mouth daily.     valsartan  (DIOVAN ) 320 MG tablet Take 1 tablet (320 mg total) by mouth daily. 90 tablet 0   venlafaxine  XR (EFFEXOR -XR) 150 MG 24 hr capsule Take 1 capsule (150 mg total) by mouth daily. 90 capsule 3   No facility-administered medications prior to visit.   Past Medical History:  Diagnosis Date   Anemia    Asthma    I dont think I have it.   CHF (congestive heart failure) (HCC)    Depression    Diabetes mellitus without complication (HCC)    type 2   Emphysema of lung (HCC)    GERD (gastroesophageal reflux disease)    Hemorrhoids    History of kidney stones    Cystoscopy   Hyperlipidemia    Hypertension    Hypothyroidism    Obesity    Pulmonary fibrosis (HCC)    Rosacea    Sleep apnea    mild, no need for CPAP   Tubular adenoma of colon 03/26/2019   Colonoscopy repeat in 3 years   Past Surgical History:  Procedure Laterality Date   ABDOMINAL HYSTERECTOMY     ANTERIOR AND POSTERIOR VAGINAL REPAIR  2020   CARDIAC CATHETERIZATION  11/01/2019   CATARACT EXTRACTION W/ INTRAOCULAR LENS IMPLANT Bilateral 2016   COLONOSCOPY     CYSTOSCOPY     DENTAL SURGERY  2016   Dental Implant   FRACTURE SURGERY Left 2013   Left ankle   INTERCOSTAL NERVE BLOCK Right 12/13/2019   Procedure: INTERCOSTAL NERVE BLOCK;  Surgeon: Kerrin Elspeth BROCKS, MD;  Location[facility] OR;  Service: Thoracic;  Laterality: Right;   LUNG BIOPSY Right 12/13/2019   Procedure: LUNG BIOPSY;  Surgeon: Kerrin Elspeth BROCKS, MD;  Location: [facility] OR;  Service: Thoracic;  Laterality: Right;   LYMPH NODE BIOPSY Right 12/13/2019   Procedure: LYMPH NODE BIOPSY;  Surgeon: Kerrin Elspeth BROCKS, MD;  Location: [facility] OR;  Service: Thoracic;  Laterality: Right;   medtronix nerve stimulator  07/18/2019   Complete InterStim Sacral Neuromudulation system implantation 07/18/19, [facility]   NASAL SINUS SURGERY     RIGHT/LEFT HEART CATH AND CORONARY ANGIOGRAPHY N/A 11/01/2019   Procedure: RIGHT/LEFT HEART CATH AND CORONARY ANGIOGRAPHY;  Surgeon: [place], Peter M, MD;  Location: [facility] INVASIVE CV LAB;  Service: Cardiovascular;  Laterality: N/A;   VAGINECTOMY, PARTIAL     VIDEO BRONCHOSCOPY N/A 12/13/2019   Procedure: VIDEO BRONCHOSCOPY;  Surgeon: Kerrin Elspeth BROCKS, MD;  Location: [facility] OR;  Service: Thoracic;  Laterality: N/A;   Allergies  Allergen Reactions   Ace Inhibitors Cough  Clarithromycin     REACTION: GI sx   Codeine Nausea Only   Sulfamethoxazole Nausea Only   Telmisartan Other (See Comments)    sleepy      Objective:    Physical Exam Vitals and nursing note reviewed.  Constitutional:      Appearance: Normal appearance. She is ill-appearing.     Interventions: Face mask in place.  HENT:     Right Ear: Tympanic membrane and ear canal normal.     Left Ear: Tympanic membrane and ear canal normal.     Nose:     Right Sinus: Frontal sinus tenderness present.     Left Sinus: Frontal sinus tenderness present.     Mouth/Throat:     Mouth: Mucous membranes are moist.     Pharynx: Posterior oropharyngeal erythema present. No pharyngeal swelling, oropharyngeal exudate or uvula swelling.     Tonsils: No tonsillar exudate or tonsillar abscesses.  Cardiovascular:     Rate and Rhythm: Normal rate and regular rhythm.  Pulmonary:     Effort: Pulmonary effort is normal.     Breath sounds: Normal breath sounds.  Abdominal:     General: There is no distension.     Palpations: Abdomen is soft.  Musculoskeletal:        General:  Normal range of motion.     Right lower leg: No edema.     Left lower leg: No edema.  Lymphadenopathy:     Head:     Right side of head: No preauricular or posterior auricular adenopathy.     Left side of head: No preauricular or posterior auricular adenopathy.     Cervical: No cervical adenopathy.  Skin:    General: Skin is warm and dry.  Neurological:     Mental Status: She is alert.  Psychiatric:        Mood and Affect: Mood normal.        Behavior: Behavior normal.    BP 97/61 (BP Location: Left Arm, Patient Position: Sitting, Cuff Size: Large)   Pulse 76   Temp (!) 97.2 F (36.2 C) (Temporal)   Ht 5' 8 (1.727 m)   Wt 180 lb (81.6 kg)   SpO2 94%   BMI 27.37 kg/m  Wt Readings from Last 3 Encounters:  10/09/23 180 lb (81.6 kg)  06/21/23 182 lb 6.4 oz (82.7 kg)  05/17/23 187 lb 9.6 oz (85.1 kg)      Lucius Krabbe, NP

## 2023-10-10 ENCOUNTER — Ambulatory Visit: Payer: Self-pay | Admitting: Family

## 2023-10-10 DIAGNOSIS — U071 COVID-19: Secondary | ICD-10-CM

## 2023-10-10 LAB — CBC WITH DIFFERENTIAL/PLATELET
Basophils Absolute: 0.1 K/uL (ref 0.0–0.1)
Basophils Relative: 0.5 % (ref 0.0–3.0)
Eosinophils Absolute: 0.8 K/uL — ABNORMAL HIGH (ref 0.0–0.7)
Eosinophils Relative: 4.7 % (ref 0.0–5.0)
HCT: 48.6 % — ABNORMAL HIGH (ref 36.0–46.0)
Hemoglobin: 15.7 g/dL — ABNORMAL HIGH (ref 12.0–15.0)
Lymphocytes Relative: 21.3 % (ref 12.0–46.0)
Lymphs Abs: 3.6 K/uL (ref 0.7–4.0)
MCHC: 32.4 g/dL (ref 30.0–36.0)
MCV: 96.2 fl (ref 78.0–100.0)
Monocytes Absolute: 1.2 K/uL — ABNORMAL HIGH (ref 0.1–1.0)
Monocytes Relative: 7 % (ref 3.0–12.0)
Neutro Abs: 11.3 K/uL — ABNORMAL HIGH (ref 1.4–7.7)
Neutrophils Relative %: 66.5 % (ref 43.0–77.0)
Platelets: 325 K/uL (ref 150.0–400.0)
RBC: 5.05 Mil/uL (ref 3.87–5.11)
RDW: 13 % (ref 11.5–15.5)
WBC: 16.9 K/uL — ABNORMAL HIGH (ref 4.0–10.5)

## 2023-10-10 MED ORDER — AMOXICILLIN-POT CLAVULANATE 875-125 MG PO TABS
1.0000 | ORAL_TABLET | Freq: Two times a day (BID) | ORAL | 0 refills | Status: DC
Start: 2023-10-10 — End: 2023-11-21

## 2023-10-10 NOTE — Telephone Encounter (Signed)
 Patient received message via mychart.  States will pick up antibiotic.  Did not have any questions or concerns in regard.

## 2023-10-10 NOTE — Progress Notes (Signed)
 please call patient and let her know lab result & message, thanks.

## 2023-10-12 ENCOUNTER — Other Ambulatory Visit: Payer: Self-pay

## 2023-10-16 ENCOUNTER — Encounter: Payer: Self-pay | Admitting: Internal Medicine

## 2023-10-16 DIAGNOSIS — J84112 Idiopathic pulmonary fibrosis: Secondary | ICD-10-CM

## 2023-10-18 ENCOUNTER — Other Ambulatory Visit: Payer: Self-pay | Admitting: Internal Medicine

## 2023-10-20 NOTE — Telephone Encounter (Signed)
 MR- please advise if ok to order inogen for the pt when you return the clinic. Thanks.

## 2023-10-23 ENCOUNTER — Ambulatory Visit: Admitting: Family Medicine

## 2023-10-23 ENCOUNTER — Encounter: Payer: Self-pay | Admitting: Family Medicine

## 2023-10-23 VITALS — BP 109/70 | HR 73 | Temp 97.7°F | Ht 68.0 in | Wt 183.0 lb

## 2023-10-23 DIAGNOSIS — J208 Acute bronchitis due to other specified organisms: Secondary | ICD-10-CM

## 2023-10-23 DIAGNOSIS — J431 Panlobular emphysema: Secondary | ICD-10-CM | POA: Diagnosis not present

## 2023-10-23 DIAGNOSIS — R197 Diarrhea, unspecified: Secondary | ICD-10-CM

## 2023-10-23 DIAGNOSIS — Z8616 Personal history of COVID-19: Secondary | ICD-10-CM | POA: Diagnosis not present

## 2023-10-23 MED ORDER — IPRATROPIUM BROMIDE 0.02 % IN SOLN: 0.5000 mg | Freq: Four times a day (QID) | RESPIRATORY_TRACT | 12 refills | Status: AC

## 2023-10-23 MED ORDER — TRIAMCINOLONE ACETONIDE 0.1 % EX CREA: 1.0000 | TOPICAL_CREAM | Freq: Two times a day (BID) | CUTANEOUS | 2 refills | Status: AC

## 2023-10-23 NOTE — Progress Notes (Signed)
 Subjective  CC:  Chief Complaint  Patient presents with   Cough   COPD    HPI: Susan Hunt is a 78 y.o. female who presents to the office today to address the problems listed above in the chief complaint. 78 year old with interstitial lung disease here for follow-up.  I reviewed notes from the end of July, had COVID.  Was treated with prednisone  and antibiotics.  CBC showed white count of 16.9, she was then treated again with Augmentin  for perceived bacterial infection due to the elevated white count although she had been on prednisone .  She is here for follow-up.  She is improving.  Coughing has improved.  No longer with shortness of breath.  Never had fever.  Chest x-ray was unremarkable showing chronic changes only.  However she still is very fatigued.  Energy levels remain low.  She is about 3 to 4 weeks out from her initial diagnosis of COVID.  She did feel that the prednisone  was helpful.    No chest pain. However, since having Augmentin  after 2 rounds of other antibiotics he has persistent cream like diarrhea.  2-3 times daily.  Hard to control with some incontinence.  No explosive diarrhea.  Has tried Imodium  without any resolution of symptoms.  No abdominal pain or fevers.  No blood in the stool  Assessment  1. Panlobular emphysema (HCC)   2. Acute bronchitis due to COVID-19 virus   3. Diarrhea of presumed infectious origin      Plan  Chronic lung disease with COVID infection improving: Reassured.  No further antibiotics or prednisone  indicated.  Continue inhalers.  Monitor for resolution of fatigue.  Reassured, elevated white blood count in part due to prednisone  use.  No need to repeat today.  Symptomatically improved. Diarrhea after antibiotic use: Need to rule out C. difficile or other infection.  Stool studies ordered.  Patient bring back sample.  Follow up: As needed Visit date not found  Orders Placed This Encounter  Procedures   Clostridium difficile Toxin B,  Qualitative, Real-Time PCR   Calprotectin, Fecal   Gastrointestinal Pathogen Pnl RT, PCR   Meds ordered this encounter  Medications   triamcinolone  cream (KENALOG ) 0.1 %    Sig: Apply 1 Application topically 2 (two) times daily.    Dispense:  45 g    Refill:  2   ipratropium (ATROVENT ) 0.02 % nebulizer solution    Sig: Take 2.5 mLs (0.5 mg total) by nebulization 4 (four) times daily.    Dispense:  75 mL    Refill:  12      I reviewed the patients updated PMH, FH, and SocHx.    Patient Active Problem List   Diagnosis Date Noted   ILD (interstitial lung disease) (HCC) 11/01/2019    Priority: High   HFrEF (heart failure with reduced ejection fraction) (HCC) 11/01/2019    Priority: High   Controlled type 2 diabetes mellitus without complication, without long-term current use of insulin  (HCC) 07/19/2019    Priority: High   Tubular adenoma of colon 03/26/2019    Priority: High   Panlobular emphysema (HCC) 06/17/2014    Priority: High   Major depression, recurrent, chronic (HCC) 01/28/2014    Priority: High   Obesity (BMI 30.0-34.9) 07/15/2013    Priority: High   Acquired hypothyroidism 04/11/2007    Priority: High   Mixed hyperlipidemia 04/11/2007    Priority: High   Hypertension associated with diabetes (HCC) 04/11/2007    Priority: High  Recurrent UTI 04/12/2019    Priority: Medium    Hepatic steatosis 03/26/2019    Priority: Medium    Osteopenia of femoral neck 05/24/2016    Priority: Medium    RENAL CALCULUS, HX OF 04/11/2007    Priority: Medium    ACE-inhibitor cough 10/02/2017    Priority: Low   Postmenopausal symptoms 02/14/2017    Priority: Low   Grade II hemorrhoids 05/03/2016    Priority: Low   Mild obstructive sleep apnea 05/06/2014    Priority: Low   Allergic rhinitis 12/18/2009    Priority: Low   Rosacea 04/11/2007    Priority: Low   URINARY INCONTINENCE 04/11/2007    Priority: Low   Mallory-Weiss tear 05/14/2023   Intractable vomiting  05/12/2023   Research study patient 06/24/2020   Chronic vertigo 07/19/2019   Current Meds  Medication Sig   amLODipine  (NORVASC ) 5 MG tablet Take 1 tablet (5 mg total) by mouth daily.   amoxicillin -clavulanate (AUGMENTIN ) 875-125 MG tablet Take 1 tablet by mouth 2 (two) times daily after a meal.   carvedilol  (COREG ) 25 MG tablet Take 1 tablet (25 mg total) by mouth 2 (two) times daily.   cetirizine (ZYRTEC) 10 MG tablet Take 10 mg by mouth daily.   FARXIGA  10 MG TABS tablet Take 1 tablet (10 mg total) by mouth daily.   ipratropium (ATROVENT ) 0.02 % nebulizer solution Take 2.5 mLs (0.5 mg total) by nebulization 4 (four) times daily.   levothyroxine  (SYNTHROID ) 112 MCG tablet Take 112mcg Saturday and Sundays   levothyroxine  (SYNTHROID ) 125 MCG tablet Take 125mcg every weekday   montelukast  (SINGULAIR ) 10 MG tablet TAKE 1 TABLET BY MOUTH EVERY NIGHT AT BEDTIME   Multiple Vitamin (MULTIVITAMIN WITH MINERALS) TABS tablet Take 1 tablet by mouth daily.   nystatin  cream (MYCOSTATIN ) Apply 1 Application topically 2 (two) times daily.   omeprazole  (PRILOSEC) 40 MG capsule Take 1 capsule (40 mg total) by mouth daily for 28 days.   ondansetron  (ZOFRAN -ODT) 4 MG disintegrating tablet Take 1 tablet (4 mg total) by mouth every 8 (eight) hours as needed for nausea or vomiting.   Pirfenidone  (ESBRIET ) 801 MG TABS Take 1 tablet (801 mg total) by mouth with breakfast, with lunch, and with evening meal.   predniSONE  (DELTASONE ) 10 MG tablet Take after breakfast:  6 tab day 1, 5 tab day 2-3, 4 tab day 4, 3 tab day 5, 2 tab day 6-7   rosuvastatin  (CRESTOR ) 10 MG tablet TAKE 1 TABLET BY MOUTH DAILY   spironolactone  (ALDACTONE ) 25 MG tablet Take 1 tablet (25 mg total) by mouth daily.   triamcinolone  cream (KENALOG ) 0.1 % Apply 1 Application topically 2 (two) times daily.   umeclidinium bromide  (INCRUSE ELLIPTA ) 62.5 MCG/ACT AEPB Inhale 1 puff into the lungs daily.   valsartan  (DIOVAN ) 320 MG tablet Take 1 tablet  (320 mg total) by mouth daily.   venlafaxine  XR (EFFEXOR -XR) 150 MG 24 hr capsule Take 1 capsule (150 mg total) by mouth daily.    Allergies: Patient is allergic to ace inhibitors, clarithromycin, codeine, sulfamethoxazole, and telmisartan. Family History: Patient family history includes Alcohol abuse in her father; Diabetes in her maternal grandmother; Drug abuse in her son; Heart disease in her mother; Hypertension in her mother; Osteoporosis in her mother; Stroke in her mother. Social History:  Patient  reports that she quit smoking about 10 years ago. Her smoking use included cigarettes. She started smoking about 25 years ago. She has a 7.5 pack-year smoking history. She has  never used smokeless tobacco. She reports that she does not currently use alcohol. She reports that she does not use drugs.  Review of Systems: Constitutional: Negative for fever malaise or anorexia Cardiovascular: negative for chest pain Respiratory: negative for SOB or persistent cough Gastrointestinal: negative for abdominal pain  Objective  Vitals: BP 109/70   Pulse 73   Temp 97.7 F (36.5 C)   Ht 5' 8 (1.727 m)   Wt 183 lb (83 kg)   SpO2 92%   BMI 27.83 kg/m  General: no acute distress , A&Ox3, appears well HEENT: PEERL, conjunctiva normal, neck is supple Cardiovascular:  RRR without murmur or gallop.  Respiratory:  Good breath sounds bilaterally, CTAB with normal respiratory effort Soft abdomen nontender Skin:  Warm, no rashes  Commons side effects, risks, benefits, and alternatives for medications and treatment plan prescribed today were discussed, and the patient expressed understanding of the given instructions. Patient is instructed to call or message via MyChart if he/she has any questions or concerns regarding our treatment plan. No barriers to understanding were identified. We discussed Red Flag symptoms and signs in detail. Patient expressed understanding regarding what to do in case of urgent  or emergency type symptoms.  Medication list was reconciled, printed and provided to the patient in AVS. Patient instructions and summary information was reviewed with the patient as documented in the AVS. This note was prepared with assistance of Dragon voice recognition software. Occasional wrong-word or sound-a-like substitutions may have occurred due to the inherent limitations of voice recognition software

## 2023-10-24 ENCOUNTER — Ambulatory Visit: Payer: Self-pay

## 2023-10-24 ENCOUNTER — Encounter: Payer: Self-pay | Admitting: Internal Medicine

## 2023-10-24 ENCOUNTER — Other Ambulatory Visit

## 2023-10-24 ENCOUNTER — Encounter: Payer: Self-pay | Admitting: Family Medicine

## 2023-10-24 DIAGNOSIS — R197 Diarrhea, unspecified: Secondary | ICD-10-CM | POA: Diagnosis not present

## 2023-10-24 NOTE — Telephone Encounter (Signed)
 Copied from CRM [id number]. Topic: Clinical - Prescription Issue >> Oct 24, 2023  2:50 PM Drema MATSU wrote: Reason for CRM: Patient husband stated that the wrong medication was called it was called in for ipratropium (ATROVENT ) 0.02 % nebulizer solution when it was suppose to be a nasal spray.This encounter was created in error - please disregard.

## 2023-10-24 NOTE — Telephone Encounter (Signed)
 Called and spoke to pt after speaking to Dr. Loni. All recommendations given to pt.   Per Dr. Loni: Pt should hold ALL BP meds for now until she is feeling better. Hold Coreg , Spironolactone , Amlodipine , Valsartan . Pt reports she is NOT on Lasix . It will be okay if her SBP rises to 140-150's, for now. She should resume meds one at a time once she is feeling better AND BP is around 110/60.   Pt should HYDRATE with Pedialyte/Gatorade/Electrolit Drinks and water. She should send us  an update by 10/27/23 via phone call and/or MyChart message.   Pt should present to nearest Emergency Room if she does pass out.  Keep appt with Acharya on 11/09/23.  Pt was given the above information and she verbalized understanding.

## 2023-10-24 NOTE — Progress Notes (Signed)
 Noted

## 2023-10-24 NOTE — Addendum Note (Signed)
 Addended by: Margie Urbanowicz Y on: 10/24/2023 03:13 PM   Modules accepted: Orders

## 2023-10-24 NOTE — Telephone Encounter (Addendum)
  FYI Only or Action Required?: Action required by provider: states will need nebulizer for atrovent   treatments and wants atrovent  nasal spray.  Patient was last seen in primary care on 10/23/2023 by Jodie Lavern CROME, MD.  Called Nurse Triage reporting No chief complaint on file..  Symptoms began today.  Interventions attempted: Nothing.  Symptoms are: stable.  Triage Disposition: No disposition on file.  Patient/caregiver understands and will follow disposition?:   FYI Only or Action Required?: Action required by provider: patient does not have a nebulizer and will need rx for one for atrovent  treatments..  Patient was last seen in primary care on 10/23/2023 by Jodie Lavern CROME, MD.  Called Nurse Triage reporting No chief complaint on file..  Symptoms began today.  Interventions attempted: Nothing.  Symptoms are: unchanged.  Triage Disposition: No disposition on file.  Patient/caregiver understands and will follow disposition?:

## 2023-10-24 NOTE — Telephone Encounter (Signed)
 Called and spoke to pt for concerns of dizziness and BP fluctuations after being diagnosed with Covid on 09/24/23. She saw her PCP on 10/23/23 due to lethargy and diarrhea. Pt states her PCP is concerned she may have CDiff as she was on three different Antibiotics after Covid Dx. She was also prescribed Prednisone . Her BP ranges from 98/56 to 130/80. She is checking it up to five times daily, due to feeling Dizzy. She has not experienced syncope. She is drinking fluids, though nothing with electrolytes. She is not eating as well and has some weight loss. Usually wears oxygen  (2L) PRN but has been using it much more frequently. Prior to PCP visit, she went to Urgent Care twice.   Currently her BP=84/50 and Pulse=75. Her husband is a Teacher, early years/pre and is home with her at the moment.   Advised pt to have someone take her to nearest ER if she did pass out. She takes most of her BP meds in the evening. Has taken Carvedilol  this am. Advised pt to drink some fluids like Gatorade and/or Pedialyte to help with hydration. OV with Dr. Acharya made for 11/09/23.

## 2023-10-25 ENCOUNTER — Ambulatory Visit: Payer: Self-pay | Admitting: Family Medicine

## 2023-10-25 ENCOUNTER — Other Ambulatory Visit: Payer: Self-pay | Admitting: Internal Medicine

## 2023-10-25 ENCOUNTER — Encounter: Payer: Self-pay | Admitting: Family Medicine

## 2023-10-25 MED ORDER — FIDAXOMICIN 200 MG PO TABS
200.0000 mg | ORAL_TABLET | Freq: Two times a day (BID) | ORAL | 0 refills | Status: DC
  Filled 2023-10-26: qty 20, 10d supply, fill #0

## 2023-10-25 NOTE — Progress Notes (Signed)
 Please call patient:test is positive for C.diff colitis; a bacterial infection of the colon.  Need to start antibiotics to treat infection.  Hold bp pills until eating and drinking better.  I've sent in medications.

## 2023-10-26 ENCOUNTER — Other Ambulatory Visit: Payer: Self-pay

## 2023-10-26 ENCOUNTER — Other Ambulatory Visit (HOSPITAL_COMMUNITY): Payer: Self-pay

## 2023-10-26 LAB — CALPROTECTIN, FECAL: Calprotectin, Fecal: 124 ug/g — ABNORMAL HIGH (ref 0–120)

## 2023-10-27 ENCOUNTER — Other Ambulatory Visit: Payer: Self-pay

## 2023-10-27 ENCOUNTER — Other Ambulatory Visit (HOSPITAL_COMMUNITY): Payer: Self-pay

## 2023-10-27 LAB — GASTROINTESTINAL PATHOGEN PNL
CampyloBacter Group: NOT DETECTED
Norovirus GI/GII: NOT DETECTED
Rotavirus A: NOT DETECTED
Salmonella species: NOT DETECTED
Shiga Toxin 1: NOT DETECTED
Shiga Toxin 2: NOT DETECTED
Shigella Species: NOT DETECTED
Vibrio Group: NOT DETECTED
Yersinia enterocolitica: NOT DETECTED

## 2023-10-27 LAB — CLOSTRIDIUM DIFFICILE TOXIN B, QUALITATIVE, REAL-TIME PCR: Toxigenic C. Difficile by PCR: DETECTED — AB

## 2023-10-30 NOTE — Telephone Encounter (Signed)
 Noted

## 2023-10-30 NOTE — Telephone Encounter (Signed)
 Yes this is fine.

## 2023-10-30 NOTE — Telephone Encounter (Signed)
 It looks like this may be a new script from visit 10/23/23. Forwarding to Dr. Jodie to review and advise.

## 2023-11-05 MED ORDER — IPRATROPIUM BROMIDE 0.06 % NA SOLN: 2.0000 | Freq: Four times a day (QID) | NASAL | 11 refills | Status: AC | PRN

## 2023-11-05 NOTE — Addendum Note (Signed)
 Addended by: JODIE GAMMONS on: 11/05/2023 05:52 PM   Modules accepted: Orders

## 2023-11-06 ENCOUNTER — Telehealth: Payer: Self-pay | Admitting: Pharmacy Technician

## 2023-11-06 MED ORDER — FARXIGA 10 MG PO TABS: 10.0000 mg | ORAL_TABLET | Freq: Every day | ORAL | 0 refills | Status: DC

## 2023-11-06 NOTE — Telephone Encounter (Signed)
 Pt's medication was sent to pt's pharmacy as requested. Confirmation received.

## 2023-11-06 NOTE — Addendum Note (Signed)
 Addended by: BLUFORD RAMP D on: 11/06/2023 09:30 AM   Modules accepted: Orders

## 2023-11-06 NOTE — Telephone Encounter (Signed)
 We received this request for refill for farxiga  for this patient from a[place]: [place]- [facility] - [place], [place] - [address] [place] [address]  Phone:?[phone]??Fax:?[phone] Thank you

## 2023-11-09 ENCOUNTER — Ambulatory Visit: Admitting: Internal Medicine

## 2023-11-09 ENCOUNTER — Ambulatory Visit
Admission: RE | Admit: 2023-11-09 | Discharge: 2023-11-09 | Disposition: A | Discharge status: Home / Self Care | Source: Ambulatory Visit | Attending: Internal Medicine | Admitting: Internal Medicine

## 2023-11-09 ENCOUNTER — Encounter: Payer: Self-pay | Admitting: Internal Medicine

## 2023-11-09 VITALS — BP 124/54 | HR 81 | Ht 68.0 in | Wt 183.8 lb

## 2023-11-09 DIAGNOSIS — I420 Dilated cardiomyopathy: Secondary | ICD-10-CM | POA: Insufficient documentation

## 2023-11-09 DIAGNOSIS — I5020 Unspecified systolic (congestive) heart failure: Secondary | ICD-10-CM

## 2023-11-09 DIAGNOSIS — R0609 Other forms of dyspnea: Secondary | ICD-10-CM

## 2023-11-09 DIAGNOSIS — I10 Essential (primary) hypertension: Secondary | ICD-10-CM | POA: Diagnosis not present

## 2023-11-09 DIAGNOSIS — J84112 Idiopathic pulmonary fibrosis: Secondary | ICD-10-CM | POA: Insufficient documentation

## 2023-11-09 NOTE — Patient Instructions (Signed)
 Medication Instructions:   NO CHANGES  Testing/Procedures: Your physician has requested that you have an echocardiogram at [facility] or [facility]. Echocardiography is a painless test that uses sound waves to create images of your heart. It provides your doctor with information about the size and shape of your heart and how well your heart's chambers and valves are working. This procedure takes approximately one hour. There are no restrictions for this procedure. Please do NOT wear cologne, perfume, aftershave, or lotions (deodorant is allowed). Please arrive 15 minutes prior to your appointment time.  Please note: We ask at that you not bring children with you during ultrasound (echo/ vascular) testing. Due to room size and safety concerns, children are not allowed in the ultrasound rooms during exams. Our front office staff cannot provide observation of children in our lobby area while testing is being conducted. An adult accompanying a patient to their appointment will only be allowed in the ultrasound room at the discretion of the ultrasound technician under special circumstances. We apologize for any inconvenience.   Follow-Up: At [facility], you and your health needs are our priority.  As part of our continuing mission to provide you with exceptional heart care, our providers are all part of one team.  This team includes your primary Cardiologist (physician) and Advanced Practice Providers or APPs (Physician Assistants and Nurse Practitioners) who all work together to provide you with the care you need, when you need it.  Your next appointment:    8 weeks with Dr. Loni   We recommend signing up for the patient portal called MyChart.  Sign up information is provided on this After Visit Summary.  MyChart is used to connect with patients for Virtual Visits (Telemedicine).  Patients are able to view lab/test results, encounter notes, upcoming appointments, etc.   Non-urgent messages can be sent to your provider as well.   To learn more about what you can do with MyChart, go to [email or web address].

## 2023-11-09 NOTE — Progress Notes (Signed)
 Cardiology Office Note:  .   Date:  11/09/2023  ID:  Susan Hunt, DOB Jun 23, 1945, MRN [id number] PCP: Susan Lavern CROME, MD  [facility] Providers Cardiologist:  Susan DELENA Merck, MD Electrophysiologist:  Elspeth Sage, MD    History of Present Illness: .   Susan Hunt is a 78 y.o. female.  Discussed the use of AI scribe software for clinical note transcription with the patient, who gave verbal consent to proceed.  History of Present Illness Susan Hunt is a 78 year old female with hypertension and cardiomyopathy who presents with blood pressure fluctuations and shortness of breath.  She experiences significant fluctuations in blood pressure, with readings ranging from 150-180/92 in the mornings to 96/40 in the afternoons. She is uncertain which readings correlate with her symptoms, as there is no noticeable change. She checks her blood pressure before and after taking medications and is not currently taking all prescribed medications due to these fluctuations.  She is currently taking valsartan  and carvedilol  in the morning but has not resumed spironolactone  due to concerns about low blood pressure. Her husband assists in managing her medication regimen. She recalls being advised to hold all blood pressure medications when her readings were low and has been gradually reintroducing them as her blood pressure increased.  She has a history of low ejection fraction, with the last echocardiogram showing 35-40% about a year ago. She was hospitalized with COVID-19, treated with antibiotics, and experienced significant illness. A previous hospitalization occurred in March, though details are unclear. She reports feeling generally unwell since these events.  She experiences increased shortness of breath and uses oxygen  more frequently, with oxygen  saturation typically around 92-93% off oxygen  and higher when using it. She feels tired and needs to rest by 11 AM. No  significant leg swelling currently, which was an issue in the past.  She has been losing weight unintentionally despite maintaining her usual diet. No use of weight loss medications like Ozempic  or Mounjaro. She also reports increased fatigue and has a history of mild sleep apnea, though it was not severe enough to require treatment.    ROS: negative except per HPI above.  Studies Reviewed: SABRA   EKG Interpretation Date/Time:  Thursday November 09 2023 14:22:55 EDT Ventricular Rate:  81 PR Interval:  162 QRS Duration:  86 QT Interval:  386 QTC Calculation: 448 R Axis:   10  Text Interpretation: Sinus rhythm with occasional Premature ventricular complexes Cannot rule out Anterior infarct (cited on or before 15-Jan-2021) When compared with ECG of 15-Jan-2021 15:08, Premature ventricular complexes are now Present Nonspecific T wave abnormality, improved in Inferior leads Nonspecific T wave abnormality no longer evident in Lateral leads Confirmed by Hunt Susan ([id number]) on 11/09/2023 2:58:41 PM    Results DIAGNOSTIC Echocardiogram: Ejection fraction 35-40%, normal right heart function, normal valve function Sleep study: Mild sleep apnea, no treatment needed (2023) Risk Assessment/Calculations:       Physical Exam:   VS:  BP (!) 124/54   Pulse 81   Ht 5' 8 (1.727 m)   Wt 183 lb 12.8 oz (83.4 kg)   SpO2 (!) 89%   BMI 27.95 kg/m    Wt Readings from Last 3 Encounters:  11/09/23 183 lb 12.8 oz (83.4 kg)  10/23/23 183 lb (83 kg)  10/09/23 180 lb (81.6 kg)     Physical Exam VITALS: BP- 124/54, SaO2- 89% GENERAL: Alert, cooperative, well developed, no acute distress HEENT: Normocephalic, normal oropharynx, moist mucous  membranes CHEST: Clear to auscultation bilaterally, no wheezes, rhonchi, or crackles CARDIOVASCULAR: Normal heart rate and rhythm, S1 and S2 normal without murmurs ABDOMEN: Soft, non-tender, non-distended, without organomegaly, normal bowel sounds EXTREMITIES: No  cyanosis or edema NEUROLOGICAL: Cranial nerves grossly intact, moves all extremities without gross motor or sensory deficit   ASSESSMENT AND PLAN: .    Assessment and Plan Assessment & Plan Heart failure with reduced ejection fraction due to dilated cardiomyopathy Ejection fraction remains at 35-40%. Symptoms of fatigue and shortness of breath persist, likely due to deconditioning from recent illnesses. Cardiomyopathy likely related to hypertension. - Order echocardiogram to assess current cardiac function and check for changes in ejection fraction or valve function.  Blood pressure fluctuations in the setting of essential hypertension Blood pressure fluctuates significantly with high morning and low afternoon readings. Not taking all antihypertensives due to low readings. Carvedilol  not fully resumed. Wide pulse pressure noted. Low blood pressure readings more concerning. - Gradually reintroduce antihypertensive medications as tolerated - Monitor blood pressure closely, especially for low readings. - Ensure adequate hydration and monitor for signs of dehydration with recent c. Difficile  Interstitial lung disease Increased use of supplemental oxygen . Oxygen  saturation off oxygen  is 89-92%. Reports increased shortness of breath and fatigue. Possible deterioration of lung function suspected. - Encourage use of supplemental oxygen  as needed to maintain adequate oxygen  saturation.  Recent C. difficile and COVID-19 infections with ongoing deconditioning and unintentional weight loss Recent C. difficile infection treated with antibiotics. COVID-19 infection led to significant deconditioning. Unintentional weight loss reported despite adequate caloric intake. Fatigue and decreased energy levels noted. - Encourage gradual increase in physical activity to improve conditioning. - Monitor weight and nutritional intake to prevent further weight loss. - Reassess if symptoms persist or  worsen.      Susan Merck, MD, FACC

## 2023-11-10 ENCOUNTER — Ambulatory Visit: Payer: Self-pay | Admitting: Internal Medicine

## 2023-11-10 LAB — ECHOCARDIOGRAM COMPLETE
Area-P 1/2: 2.96 cm2
Height: 68 in
S' Lateral: 4.3 cm
Weight: 2940.8 [oz_av]

## 2023-11-18 ENCOUNTER — Encounter: Payer: Self-pay | Admitting: Internal Medicine

## 2023-11-21 ENCOUNTER — Encounter: Payer: Self-pay | Admitting: Family Medicine

## 2023-11-21 ENCOUNTER — Other Ambulatory Visit: Payer: Self-pay

## 2023-11-21 ENCOUNTER — Other Ambulatory Visit: Payer: Self-pay | Admitting: Internal Medicine

## 2023-11-21 ENCOUNTER — Ambulatory Visit (INDEPENDENT_AMBULATORY_CARE_PROVIDER_SITE_OTHER): Admitting: Family Medicine

## 2023-11-21 VITALS — BP 94/58 | HR 77 | Temp 97.7°F | Ht 68.0 in | Wt 183.4 lb

## 2023-11-21 DIAGNOSIS — J431 Panlobular emphysema: Secondary | ICD-10-CM | POA: Diagnosis not present

## 2023-11-21 DIAGNOSIS — E782 Mixed hyperlipidemia: Secondary | ICD-10-CM

## 2023-11-21 DIAGNOSIS — J849 Interstitial pulmonary disease, unspecified: Secondary | ICD-10-CM

## 2023-11-21 DIAGNOSIS — I152 Hypertension secondary to endocrine disorders: Secondary | ICD-10-CM | POA: Diagnosis not present

## 2023-11-21 DIAGNOSIS — E1159 Type 2 diabetes mellitus with other circulatory complications: Secondary | ICD-10-CM

## 2023-11-21 DIAGNOSIS — Z23 Encounter for immunization: Secondary | ICD-10-CM

## 2023-11-21 DIAGNOSIS — I952 Hypotension due to drugs: Secondary | ICD-10-CM | POA: Diagnosis not present

## 2023-11-21 DIAGNOSIS — I5020 Unspecified systolic (congestive) heart failure: Secondary | ICD-10-CM

## 2023-11-21 DIAGNOSIS — A0472 Enterocolitis due to Clostridium difficile, not specified as recurrent: Secondary | ICD-10-CM

## 2023-11-21 DIAGNOSIS — E039 Hypothyroidism, unspecified: Secondary | ICD-10-CM

## 2023-11-21 DIAGNOSIS — E119 Type 2 diabetes mellitus without complications: Secondary | ICD-10-CM

## 2023-11-21 DIAGNOSIS — F339 Major depressive disorder, recurrent, unspecified: Secondary | ICD-10-CM

## 2023-11-21 LAB — CBC WITH DIFFERENTIAL/PLATELET
Basophils Absolute: 0.1 K/uL (ref 0.0–0.1)
Basophils Relative: 0.6 % (ref 0.0–3.0)
Eosinophils Absolute: 0.5 K/uL (ref 0.0–0.7)
Eosinophils Relative: 4.8 % (ref 0.0–5.0)
HCT: 44.8 % (ref 36.0–46.0)
Hemoglobin: 14.7 g/dL (ref 12.0–15.0)
Lymphocytes Relative: 20.6 % (ref 12.0–46.0)
Lymphs Abs: 2.1 K/uL (ref 0.7–4.0)
MCHC: 32.9 g/dL (ref 30.0–36.0)
MCV: 96.1 fl (ref 78.0–100.0)
Monocytes Absolute: 0.9 K/uL (ref 0.1–1.0)
Monocytes Relative: 8.6 % (ref 3.0–12.0)
Neutro Abs: 6.6 K/uL (ref 1.4–7.7)
Neutrophils Relative %: 65.4 % (ref 43.0–77.0)
Platelets: 246 K/uL (ref 150.0–400.0)
RBC: 4.66 Mil/uL (ref 3.87–5.11)
RDW: 12.8 % (ref 11.5–15.5)
WBC: 10.1 K/uL (ref 4.0–10.5)

## 2023-11-21 LAB — COMPREHENSIVE METABOLIC PANEL WITH GFR
ALT: 19 U/L (ref 0–35)
AST: 26 U/L (ref 0–37)
Albumin: 4.2 g/dL (ref 3.5–5.2)
Alkaline Phosphatase: 84 U/L (ref 39–117)
BUN: 14 mg/dL (ref 6–23)
CO2: 30 meq/L (ref 19–32)
Calcium: 9.7 mg/dL (ref 8.4–10.5)
Chloride: 103 meq/L (ref 96–112)
Creatinine, Ser: 1.08 mg/dL (ref 0.40–1.20)
GFR: 49 mL/min — ABNORMAL LOW (ref >60.00)
Glucose, Bld: 82 mg/dL (ref 70–99)
Potassium: 4.2 meq/L (ref 3.5–5.1)
Sodium: 140 meq/L (ref 135–145)
Total Bilirubin: 0.5 mg/dL (ref 0.2–1.2)
Total Protein: 7.1 g/dL (ref 6.0–8.3)

## 2023-11-21 LAB — TSH: TSH: 1.34 u[IU]/mL (ref 0.35–5.50)

## 2023-11-21 LAB — LIPID PANEL
Cholesterol: 150 mg/dL (ref 0–200)
HDL: 60.5 mg/dL (ref >39.00)
LDL Cholesterol: 67 mg/dL (ref 0–99)
NonHDL: 89.78
Total CHOL/HDL Ratio: 2
Triglycerides: 112 mg/dL (ref 0.0–149.0)
VLDL: 22.4 mg/dL (ref 0.0–40.0)

## 2023-11-21 LAB — MICROALBUMIN / CREATININE URINE RATIO
Creatinine,U: 142.7 mg/dL
Microalb Creat Ratio: 13.2 mg/g (ref 0.0–30.0)
Microalb, Ur: 1.9 mg/dL (ref 0.0–1.9)

## 2023-11-21 LAB — HEMOGLOBIN A1C: Hgb A1c MFr Bld: 6.6 % — ABNORMAL HIGH (ref 4.6–6.5)

## 2023-11-21 MED ORDER — VALSARTAN 320 MG PO TABS: 160.0000 mg | ORAL_TABLET | Freq: Every day | ORAL | Status: DC

## 2023-11-21 NOTE — Progress Notes (Signed)
 Subjective  CC:  Chief Complaint  Patient presents with   Follow-up    C Diff   Diabetes   Hyperlipidemia   Hypertension    HPI: Susan Hunt is a 78 y.o. female who presents to the office today for follow up of diabetes and problems listed above in the chief complaint.  Treated for C. difficile colitis about4 weeks ago, fortunately has recovered well.  Reports her stomach is feeling back to normal.  No longer having diarrhea.  Appetite is good. Diabetes follow up: Her diabetic control is reported as Unchanged.  A1c numbers have been well-controlled.  She is on Farxiga  for heart failure but it is also helping with diabetes.  Diet is good.  She is overdue for her diabetic eye exam.  She is on ARB and statin.  She denies exertional CP or SOB or symptomatic hypoglycemia. She denies foot sores or paresthesias.  Hypertension: Blood pressure remains low.  Patient reports she has had to falls due to lightheadedness.  I reviewed recent notes from cardiology.  She did restart her blood pressure medicines after her appetite returned.  No chest pain or palpitations. Hyperlipidemia on rosuvastatin  10 mg nightly.  She is fasting for recheck.  Tolerates well. Treated by pulmonology for interstitial lung disease.  She notes that when her blood pressure is low it is hard for her to breathe.  On multiple inhalers.  Reviewed pulmonology notes. Hypothyroidism due for recheck.  She is on next dosing Monday through Friday versus Saturday and Sunday.  She feels that it is well-controlled.  She is compliant with her medication. Mood is stable on Effexor  Wt Readings from Last 3 Encounters:  11/21/23 183 lb 6.4 oz (83.2 kg)  11/09/23 183 lb 12.8 oz (83.4 kg)  10/23/23 183 lb (83 kg)    BP Readings from Last 3 Encounters:  11/21/23 (!) 94/58  11/09/23 (!) 124/54  10/23/23 109/70    Assessment  1. C. difficile colitis   2. Controlled type 2 diabetes mellitus without complication, without long-term  current use of insulin  (HCC)   3. HFrEF (heart failure with reduced ejection fraction) (HCC)   4. Hypertension associated with diabetes (HCC)   5. Hypotension due to drugs   6. ILD (interstitial lung disease) (HCC)   7. Mixed hyperlipidemia   8. Acquired hypothyroidism   9. Need for influenza vaccination   10. Major depression, recurrent, chronic (HCC)   11. Panlobular emphysema (HCC)      Plan  C. difficile colitis: Clinically resolved.  Check blood count to ensure normalized. Diabetes is currently well controlled.  Will recheck A1c.  Recommend eye exam.  Check urine nephropathy screen on ARB and Farxiga .  Continue diabetic diet.  No neuropathy symptoms.  No known retinopathy Chronic heart failure per cardiology Hypertension but now hypotensive.  Will decrease Diovan  to 160 from 320.  She will continue her carvedilol  and spironolactone .  She has follow-up with a clinical pharmacist with cardiology next week to further adjust blood pressure medications.  Need to avoid hypotension and falls.  Patient understands and agrees Recheck thyroid  levels on levothyroxine  Check lipids Check LFTs Continue inhalers per pulmonology Continue Effexor  for mood control.  Very well-controlled currently Flu shot updated today  Follow up: 6 months for complete physical Orders Placed This Encounter  Procedures   Flu vaccine HIGH DOSE PF(Fluzone Trivalent)   CBC with Differential/Platelet   Comprehensive metabolic panel with GFR   Lipid panel   Hemoglobin A1c  TSH   Microalbumin / creatinine urine ratio   Meds ordered this encounter  Medications   valsartan  (DIOVAN ) 320 MG tablet    Sig: Take 0.5 tablets (160 mg total) by mouth daily.      Immunization History  Administered Date(s) Administered   Fluad Quad(high Dose 65+) 12/09/2019, 12/11/2020   Fluad Trivalent(High Dose 65+) 12/20/2022   Hepatitis A, Adult 04/06/2018   INFLUENZA, HIGH DOSE SEASONAL PF 12/17/2014, 12/20/2015, 01/15/2017,  12/18/2017, 11/28/2018, 11/21/2023   Influenza Split 01/06/2009   Influenza, Seasonal, Injecte, Preservative Fre 01/07/2014, 12/17/2014   Influenza,inj,Quad PF,6+ Mos 01/15/2017   Influenza,trivalent, recombinat, inj, PF 12/03/2012   Influenza-Unspecified 01/15/2017   Moderna Covid-19 Vaccine Bivalent Booster 33yrs & up 03/18/2021   Moderna SARS-COV2 Booster Vaccination 01/07/2020   Moderna Sars-Covid-2 Vaccination 04/17/2019, 05/15/2019, 08/20/2020   Pneumococcal Conjugate-13 01/07/2014   Pneumococcal Polysaccharide-23 11/23/2011   Td 03/14/2006, 10/16/2011   Zoster Recombinant(Shingrix) 10/21/2017, 12/18/2017   Zoster, Live 12/06/2013    Diabetes Related Lab Review: Lab Results  Component Value Date   HGBA1C 6.3 01/10/2023   HGBA1C 6.3 (H) 12/22/2021   HGBA1C 6.1 06/14/2021    No results found for: MACKEY CURRENT Lab Results  Component Value Date   CREATININE 1.05 06/21/2023   BUN 17 06/21/2023   NA 139 06/21/2023   K 4.0 06/21/2023   CL 104 06/21/2023   CO2 27 06/21/2023   Lab Results  Component Value Date   CHOL 143 01/10/2023   CHOL 159 12/11/2020   CHOL 155 12/09/2019   Lab Results  Component Value Date   HDL 52.80 01/10/2023   HDL 52 12/11/2020   HDL 65 12/09/2019   Lab Results  Component Value Date   LDLCALC 61 01/10/2023   LDLCALC 81 12/11/2020   LDLCALC 73 12/09/2019   Lab Results  Component Value Date   TRIG 146.0 01/10/2023   TRIG 163 (H) 12/11/2020   TRIG 87 12/09/2019   Lab Results  Component Value Date   CHOLHDL 3 01/10/2023   CHOLHDL 3.1 12/11/2020   CHOLHDL 2.4 12/09/2019   Lab Results  Component Value Date   LDLDIRECT 146.7 05/29/2006   The 10-year ASCVD risk score (Arnett DK, et al., 2019) is: 32.5%   Values used to calculate the score:     Age: 48 years     Clincally relevant sex: Female     Is Non-Hispanic African American: No     Diabetic: Yes     Tobacco smoker: No     Systolic Blood Pressure: 94 mmHg     Is  BP treated: Yes     HDL Cholesterol: 52.8 mg/dL     Total Cholesterol: 143 mg/dL I have reviewed the PMH, Fam and Soc history. Patient Active Problem List   Diagnosis Date Noted   ILD (interstitial lung disease) (HCC) 11/01/2019    Priority: High   HFrEF (heart failure with reduced ejection fraction) (HCC) 11/01/2019    Priority: High   Controlled type 2 diabetes mellitus without complication, without long-term current use of insulin  (HCC) 07/19/2019    Priority: High   Tubular adenoma of colon 03/26/2019    Priority: High    Colonoscopy repeat in 3 years    Panlobular emphysema (HCC) 06/17/2014    Priority: High    Overview:  Long-term former smoker, chest CT March 2016 shows early fibrotic changes and apices and emphysematous changes.  Inhalers started    Major depression, recurrent, chronic (HCC) 01/28/2014    Priority: High  Obesity (BMI 30.0-34.9) 07/15/2013    Priority: High   Acquired hypothyroidism 04/11/2007    Priority: High   Mixed hyperlipidemia 04/11/2007    Priority: High   Hypertension associated with diabetes (HCC) 04/11/2007    Priority: High   Recurrent UTI 04/12/2019    Priority: Medium     Daily prophylaxis started with Macrobid  January 2021    Hepatic steatosis 03/26/2019    Priority: Medium    Osteopenia of femoral neck 05/24/2016    Priority: Medium     Dexa 11/2021 T = - 1.9, stable recheck 3-5 years.  Dexa 05/2019 T = - 1.7 femur. Stable. Recheck 2-3 years. Dexa 05/2016 T = -1.4; stable. No significant decrease. Recheck in 2-3 years.    RENAL CALCULUS, HX OF 04/11/2007    Priority: Medium    ACE-inhibitor cough 10/02/2017    Priority: Low   Postmenopausal symptoms 02/14/2017    Priority: Low    Uses clonidine  prn at night to help her sleep IF needed for hot flushes or poor sleep. Started by GYN years ago. occasional use.    Grade II hemorrhoids 05/03/2016    Priority: Low   Mild obstructive sleep apnea 05/06/2014    Priority: Low     Overview:  Negative sleep study 2013 Mild OA 05/2021    Allergic rhinitis 12/18/2009    Priority: Low    Overview:  10/1 IMO update    Rosacea 04/11/2007    Priority: Low    Qualifier: Diagnosis of  By: Laurice, CMA (AAMA), Clotilda RAMAN     URINARY INCONTINENCE 04/11/2007    Priority: Low    Qualifier: Diagnosis of  By: Charlett MD, Apolinar POUR     Mallory-Weiss tear 05/14/2023   Intractable vomiting 05/12/2023   Research study patient 06/24/2020   Chronic vertigo 07/19/2019    Social History: Patient  reports that she quit smoking about 10 years ago. Her smoking use included cigarettes. She started smoking about 25 years ago. She has a 7.5 pack-year smoking history. She has never used smokeless tobacco. She reports that she does not currently use alcohol. She reports that she does not use drugs.  Review of Systems: Ophthalmic: negative for eye pain, loss of vision or double vision Cardiovascular: negative for chest pain Respiratory: negative for SOB or persistent cough Gastrointestinal: negative for abdominal pain Genitourinary: negative for dysuria or gross hematuria MSK: negative for foot lesions Neurologic: negative for weakness or gait disturbance  Objective  Vitals: BP (!) 94/58   Pulse 77   Temp 97.7 F (36.5 C)   Ht 5' 8 (1.727 m)   Wt 183 lb 6.4 oz (83.2 kg)   SpO2 98%   BMI 27.89 kg/m  General: well appearing, no acute distress, appears well Psych:  Alert and oriented, normal mood and affect HEENT:  Normocephalic, atraumatic, moist mucous membranes, supple neck  Cardiovascular:  Nl S1 and S2, RRR without murmur, gallop or rub. no edema Respiratory: Clear breath sounds Gastrointestinal: normal BS, soft, nontender   Diabetic education: ongoing education regarding chronic disease management for diabetes was given today. We continue to reinforce the ABC's of diabetic management: A1c (<7 or 8 dependent upon patient), tight blood pressure control, and cholesterol  management with goal LDL < 100 minimally. We discuss diet strategies, exercise recommendations, medication options and possible side effects. At each visit, we review recommended immunizations and preventive care recommendations for diabetics and stress that good diabetic control can prevent other problems. See below for this  patient's data.   Commons side effects, risks, benefits, and alternatives for medications and treatment plan prescribed today were discussed, and the patient expressed understanding of the given instructions. Patient is instructed to call or message via MyChart if he/she has any questions or concerns regarding our treatment plan. No barriers to understanding were identified. We discussed Red Flag symptoms and signs in detail. Patient expressed understanding regarding what to do in case of urgent or emergency type symptoms.  Medication list was reconciled, printed and provided to the patient in AVS. Patient instructions and summary information was reviewed with the patient as documented in the AVS. This note was prepared with assistance of Dragon voice recognition software. Occasional wrong-word or sound-a-like substitutions may have occurred due to the inherent limitations of voice recognition software

## 2023-11-21 NOTE — Patient Instructions (Signed)
 Please return in 6 months for your annual complete physical; please come fasting.  For follow up on chronic medical conditions   I will release your lab results to you on your MyChart account with further instructions. You may see the results before I do, but when I review them I will send you a message with my report or have my assistant call you if things need to be discussed. Please reply to my message with any questions. Thank you!   You are overdue for you eye exam and colonoscopy. Please get scheduled with your eye doctor and your GI doctor.   If you have any questions or concerns, please don't hesitate to send me a message via MyChart or call the office at [phone]. Thank you for visiting with us  today! It's our pleasure caring for you.

## 2023-11-21 NOTE — Progress Notes (Signed)
 Specialty Pharmacy Refill Coordination Note  Susan Hunt is a 78 y.o. female contacted today regarding refills of specialty medication(s) Pirfenidone    Patient requested Delivery   Delivery date: 11/24/23   Verified address: [address] [place] [address]   Medication will be filled on 09.11.25.   This fill date is pending response to refill request from provider. Patient is aware and if they have not received fill by intended date they must follow up with pharmacy.

## 2023-11-21 NOTE — Telephone Encounter (Signed)
 Received refill request for pirfenidone . LFTs collected today, results pending. Will refill Rx pending results.  Aleck Puls, PharmD, BCPS Clinical Pharmacist  [facility]

## 2023-11-21 NOTE — Telephone Encounter (Signed)
 Pt requesting refill of specialty medication. Routing to rx team.

## 2023-11-22 ENCOUNTER — Other Ambulatory Visit (HOSPITAL_COMMUNITY): Payer: Self-pay

## 2023-11-22 ENCOUNTER — Other Ambulatory Visit: Payer: Self-pay

## 2023-11-22 MED ORDER — PIRFENIDONE 801 MG PO TABS
801.0000 mg | ORAL_TABLET | Freq: Three times a day (TID) | ORAL | 0 refills | Status: DC
  Filled 2023-11-22: qty 90, 30d supply, fill #0
  Filled 2023-12-18 – 2023-12-20 (×2): qty 90, 30d supply, fill #1
  Filled 2024-01-12: qty 90, 30d supply, fill #2

## 2023-11-22 NOTE — Telephone Encounter (Signed)
 Refill sent for ESBRIET  to [facility]: [phone]   Dose: 801mg  by mouth three times daily   Last OV: 02/16/2023 Provider: Dr. Geronimo Pertinent labs: LFTs wnl 11/21/23  Next OV: overdue; scheduled 12/26/2023 - will provide enough supply until this visit. Needs appointment for future refills.  Aleck Puls, PharmD, BCPS Clinical Pharmacist  [facility]

## 2023-11-24 ENCOUNTER — Telehealth: Payer: Self-pay

## 2023-11-24 NOTE — Telephone Encounter (Signed)
 Copied from CRM #[id number]. Topic: Clinical - Medication Question >> Nov 24, 2023 12:36 PM Martinique E wrote: Reason for CRM: Patient is needing a Covid prescription sent over to the [employer].

## 2023-11-26 ENCOUNTER — Ambulatory Visit: Payer: Self-pay | Admitting: Family Medicine

## 2023-11-26 NOTE — Progress Notes (Signed)
 See mychart note Dear Ms. Pepitone, Your lab results are all stable. Your diabetic control is slightly worse but still controlled. I hope the change in your blood pressure medication is helping.  Sincerely, Dr. Jodie

## 2023-11-27 ENCOUNTER — Ambulatory Visit: Admitting: Family Medicine

## 2023-12-05 ENCOUNTER — Encounter: Payer: Self-pay | Admitting: Pharmacist Clinician (PhC)/ Clinical Pharmacy Specialist

## 2023-12-05 ENCOUNTER — Ambulatory Visit: Attending: Cardiology | Admitting: Pharmacist Clinician (PhC)/ Clinical Pharmacy Specialist

## 2023-12-05 VITALS — BP 102/64 | HR 56

## 2023-12-05 DIAGNOSIS — E1159 Type 2 diabetes mellitus with other circulatory complications: Secondary | ICD-10-CM | POA: Diagnosis not present

## 2023-12-05 DIAGNOSIS — I152 Hypertension secondary to endocrine disorders: Secondary | ICD-10-CM | POA: Diagnosis not present

## 2023-12-05 MED ORDER — CARVEDILOL 12.5 MG PO TABS: 12.5000 mg | ORAL_TABLET | Freq: Two times a day (BID) | ORAL | Status: DC

## 2023-12-05 NOTE — Assessment & Plan Note (Signed)
 Assessment: BP is controlled in office BP 102/64 mmHg Slightly unstable on her feet today, got a little dizzy coming to office from lobby Tolerates valsartan  160, carvedilol  25  sprionolactone 25 well, without any side effects Denies SOB, palpitation, chest pain, headaches,or swelling Reiterated the importance of regular exercise and low salt diet   Plan:  Decrease carvedilol  to 12.5 mg twice daily Move spironolactone  25 mg to mornings, starting tomorrow Continue taking valsartan  160 mg daily in the evenings  Patient to keep record of BP readings with heart rate and report to us  at the next visit Patient to follow up with me in 1 month  Labs ordered today: none

## 2023-12-05 NOTE — Patient Instructions (Addendum)
 Follow up appointment: Tuesday October 28 at 9 am (after Dr. Loni)  Take your BP meds as follows: CUT CARVEDILOL  in half and take 12.5 mg twice daily    Move spironolactone  to mornings, starting tomorrow     Continue with valsartan  in the evenings.   Check your blood pressure at home daily (if able) and keep record of the readings.  Your blood pressure goal is < 140/80  To check your pressure at home you will need to:  1. Sit up in a chair, with feet flat on the floor and back supported. Do not cross your ankles or legs. 2. Rest your left arm so that the cuff is about heart level. If the cuff goes on your upper arm,  then just relax the arm on the table, arm of the chair or your lap. If you have a wrist cuff, we  suggest relaxing your wrist against your chest (think of it as Pledging the Flag with the  wrong arm).  3. Place the cuff snugly around your arm, about 1 inch above the crook of your elbow. The  cords should be inside the groove of your elbow.  4. Sit quietly, with the cuff in place, for about 5 minutes. After that 5 minutes press the power  button to start a reading. 5. Do not talk or move while the reading is taking place.  6. Record your readings on a sheet of paper. Although most cuffs have a memory, it is often  easier to see a pattern developing when the numbers are all in front of you.  7. You can repeat the reading after 1-3 minutes if it is recommended  Make sure your bladder is empty and you have not had caffeine or tobacco within the last 30 min  Always bring your blood pressure log with you to your appointments. If you have not brought your monitor in to be double checked for accuracy, please bring it to your next appointment.  You can find a list of quality blood pressure cuffs at [email or web address]  Important lifestyle changes to control high blood pressure  Intervention  Effect on the BP  Lose extra pounds and watch your waistline Weight loss is one of the  most effective lifestyle changes for controlling blood pressure. If you're overweight or obese, losing even a small amount of weight can help reduce blood pressure. Blood pressure might go down by about 1 millimeter of mercury (mm Hg) with each kilogram (about 2.2 pounds) of weight lost.  Exercise regularly As a general goal, aim for at least 30 minutes of moderate physical activity every day. Regular physical activity can lower high blood pressure by about 5 to 8 mm Hg.  Eat a healthy diet Eating a diet rich in whole grains, fruits, vegetables, and low-fat dairy products and low in saturated fat and cholesterol. A healthy diet can lower high blood pressure by up to 11 mm Hg.  Reduce salt (sodium) in your diet Even a small reduction of sodium in the diet can improve heart health and reduce high blood pressure by about 5 to 6 mm Hg.  Limit alcohol One drink equals 12 ounces of beer, 5 ounces of wine, or 1.5 ounces of 80-proof liquor.  Limiting alcohol to less than one drink a day for women or two drinks a day for men can help lower blood pressure by about 4 mm Hg.   If you have any questions or concerns please use My Chart to send  questions or call the office at [phone]

## 2023-12-05 NOTE — Progress Notes (Signed)
 Office Visit    Patient Name: Susan Hunt Date of Encounter: 12/05/2023  Primary Care Provider:  Jodie Lavern CROME, MD Primary Cardiologist:  Soyla DELENA Merck, MD  Chief Complaint    Hypertension  Significant Past Medical History   HFrEF EF 35-40% - on GDMT  HTN On amlodipine , carvedilol , spironolactone , valsartan   DM2 9/25 A1c 6.6, on dapagliflozin   OSA Mild, no need for CPAP (2023)       Allergies  Allergen Reactions   Ace Inhibitors Cough   Clarithromycin     REACTION: GI sx   Codeine Nausea Only   Sulfamethoxazole Nausea Only   Telmisartan Other (See Comments)    sleepy    History of Present Illness    Susan Hunt is a 78 y.o. female patient of Dr Merck, in the office today for hypertension evaluation.  Ms talise sligh the office earlier this month, concerned about fluctuating BP readings.  She was getting readings ranging from 92/40 to 154/84.  She noted that SOB was escalated with the low readings.  She was asked to take consistent readings twice daily for 2 weeks and bring that information, along with her device, to the office today.   She returns today with her husband, a retired Teacher, early years/pre.  Checked her BP twice daily and continues to get wide range of readings.  She complains of dizziness/lightheadedness when the pressures drop to below 110 systolic.  Since July she had Covid and a UTI.  Ended up on 3 antibiotics and Paxlovid .  This combination led to an episode of C dif, and she is now feeling like she's getting back to her baseline.  When her pressure started dropping Dr. Acharya stopped her amlodipine , then PCP later cut valsartan  dose by half.  She had some issues with dehydration and is now drinking 1-2 bottles of Gatorade (12 oz each) most days.    Blood Pressure Goal:  140/80  Current Medications:  valsartan  160, carvedilol  25  sprionolactone 25   - all in pm (amlodipine )  Previously tried:  ACEI - cough, telmisartan -  drowsiness  Family Hx:   mother had stroke  Social Hx:      Tobacco: no  Alcohol: occasional wine  Caffeine:coffee in the am, otherwise decaf  Diet:    more eating out; home for breakfast - usually a bagel; otherwise not much meat - mostly chicken; more vegetables/salads; beans also for protein  Exercise: limited by ILD  Home BP readings: new wrist cuff, [employer] brand - did not bring with her today    Adherence Assessment  Do you ever forget to take your medication? [] Yes [x] No  Do you ever skip doses due to side effects? [] Yes [x] No  Do you have trouble affording your medicines? [] Yes [x] No  Are you ever unable to pick up your medication due to transportation difficulties? [] Yes [x] No   Adherence strategy: 7 day minder  Accessory Clinical Findings    Lab Results  Component Value Date   CREATININE 1.08 11/21/2023   BUN 14 11/21/2023   NA 140 11/21/2023   K 4.2 11/21/2023   CL 103 11/21/2023   CO2 30 11/21/2023   Lab Results  Component Value Date   ALT 19 11/21/2023   AST 26 11/21/2023   ALKPHOS 84 11/21/2023   BILITOT 0.5 11/21/2023   Lab Results  Component Value Date   HGBA1C 6.6 (H) 11/21/2023    Home Medications    Current Outpatient Medications  Medication Sig Dispense Refill  carvedilol  (COREG ) 12.5 MG tablet Take 1 tablet (12.5 mg total) by mouth 2 (two) times daily.     cetirizine (ZYRTEC) 10 MG tablet Take 10 mg by mouth daily.     FARXIGA  10 MG TABS tablet Take 1 tablet (10 mg total) by mouth daily. 30 tablet 0   ipratropium (ATROVENT ) 0.02 % nebulizer solution Take 2.5 mLs (0.5 mg total) by nebulization 4 (four) times daily. 75 mL 12   ipratropium (ATROVENT ) 0.06 % nasal spray Place 2 sprays into the nose 4 (four) times daily as needed for rhinitis. 15 mL 11   levothyroxine  (SYNTHROID ) 112 MCG tablet Take 112mcg Saturday and Sundays 24 tablet 1   levothyroxine  (SYNTHROID ) 125 MCG tablet Take 125mcg every weekday 60 tablet 1   montelukast   (SINGULAIR ) 10 MG tablet TAKE 1 TABLET BY MOUTH EVERY NIGHT AT BEDTIME 90 tablet 1   Multiple Vitamin (MULTIVITAMIN WITH MINERALS) TABS tablet Take 1 tablet by mouth daily.     nystatin  cream (MYCOSTATIN ) Apply 1 Application topically 2 (two) times daily. 30 g 2   omeprazole  (PRILOSEC) 40 MG capsule Take 1 capsule (40 mg total) by mouth daily for 28 days. 28 capsule 0   ondansetron  (ZOFRAN -ODT) 4 MG disintegrating tablet Take 1 tablet (4 mg total) by mouth every 8 (eight) hours as needed for nausea or vomiting. 20 tablet 1   Pirfenidone  (ESBRIET ) 801 MG TABS Take 1 tablet (801 mg total) by mouth with breakfast, with lunch, and with evening meal. ** Keep appointment on 12/26/23 for future refills. ** 270 tablet 0   rosuvastatin  (CRESTOR ) 10 MG tablet TAKE 1 TABLET BY MOUTH DAILY 90 tablet 3   spironolactone  (ALDACTONE ) 25 MG tablet Take 1 tablet (25 mg total) by mouth daily.     triamcinolone  cream (KENALOG ) 0.1 % Apply 1 Application topically 2 (two) times daily. 45 g 2   umeclidinium bromide  (INCRUSE ELLIPTA ) 62.5 MCG/ACT AEPB Inhale 1 puff into the lungs daily. 30 each 1   valsartan  (DIOVAN ) 320 MG tablet Take 0.5 tablets (160 mg total) by mouth daily.     venlafaxine  XR (EFFEXOR -XR) 150 MG 24 hr capsule Take 1 capsule (150 mg total) by mouth daily. 90 capsule 3   No current facility-administered medications for this visit.         Assessment & Plan    Hypertension associated with diabetes (HCC) Assessment: BP is controlled in office BP 102/64 mmHg Slightly unstable on her feet today, got a little dizzy coming to office from lobby Tolerates valsartan  160, carvedilol  25  sprionolactone 25 well, without any side effects Denies SOB, palpitation, chest pain, headaches,or swelling Reiterated the importance of regular exercise and low salt diet   Plan:  Decrease carvedilol  to 12.5 mg twice daily Move spironolactone  25 mg to mornings, starting tomorrow Continue taking valsartan  160 mg daily  in the evenings  Patient to keep record of BP readings with heart rate and report to us  at the next visit Patient to follow up with me in 1 month  Labs ordered today: none   Allean Mink PharmD CPP [facility]  [address] [place], [place] [address] [phone]

## 2023-12-06 ENCOUNTER — Ambulatory Visit: Admitting: Family Medicine

## 2023-12-06 MED ORDER — CARVEDILOL 12.5 MG PO TABS: 12.5000 mg | ORAL_TABLET | Freq: Two times a day (BID) | ORAL | 3 refills | Status: DC

## 2023-12-08 ENCOUNTER — Other Ambulatory Visit: Payer: Self-pay | Admitting: Internal Medicine

## 2023-12-08 MED ORDER — CARVEDILOL 12.5 MG PO TABS: 12.5000 mg | ORAL_TABLET | Freq: Two times a day (BID) | ORAL | 3 refills | Status: AC

## 2023-12-10 DIAGNOSIS — N3000 Acute cystitis without hematuria: Secondary | ICD-10-CM | POA: Diagnosis not present

## 2023-12-14 ENCOUNTER — Other Ambulatory Visit: Payer: Self-pay

## 2023-12-18 ENCOUNTER — Other Ambulatory Visit: Payer: Self-pay | Admitting: Family

## 2023-12-18 ENCOUNTER — Other Ambulatory Visit: Payer: Self-pay

## 2023-12-19 ENCOUNTER — Other Ambulatory Visit (HOSPITAL_COMMUNITY): Payer: Self-pay

## 2023-12-19 ENCOUNTER — Telehealth: Payer: Self-pay | Admitting: Pharmacy Technician

## 2023-12-19 MED ORDER — FARXIGA 10 MG PO TABS: 10.0000 mg | ORAL_TABLET | Freq: Every day | ORAL | 3 refills | Status: DC

## 2023-12-19 NOTE — Telephone Encounter (Signed)
 Patient Advocate Encounter   The patient was approved for a [employer] grant that will help cover the cost of Farxiga  BRAND Total amount awarded, 7500.00.  Effective: 11/19/23 - 11/17/24   APW:[id number] ERW:[id number] Hmnle:[id number] PI:[id number]  [employer] ID: [id number]   Pharmacy provided with approval and processing information. Patient informed via mychart

## 2023-12-19 NOTE — Addendum Note (Signed)
 Addended by: GLADIS REENA GAILS on: 12/19/2023 12:23 PM   Modules accepted: Orders

## 2023-12-19 NOTE — Telephone Encounter (Signed)
 Susan Hunt, Crystal D, CPhT to [facility] Triage (Selected Message)   12/19/23 11:27 AM Patient was approved for [facility] for Farxiga  , patient and pharmacy made aware! [facility] on patients profile will need a new script sent there with BRAND name Farxiga 

## 2023-12-19 NOTE — Telephone Encounter (Signed)
 Prescription sent electronically

## 2023-12-20 ENCOUNTER — Other Ambulatory Visit: Payer: Self-pay

## 2023-12-20 NOTE — Progress Notes (Signed)
 Specialty Pharmacy Refill Coordination Note  Susan Hunt is a 78 y.o. female contacted today regarding refills of specialty medication(s) Pirfenidone    Patient requested Delivery   Delivery date: 12/25/23   Verified address: [address]  [place] [address]   Medication will be filled on 12/22/23.

## 2023-12-21 ENCOUNTER — Other Ambulatory Visit: Payer: Self-pay

## 2023-12-25 ENCOUNTER — Encounter: Payer: Self-pay | Admitting: Student

## 2023-12-25 ENCOUNTER — Ambulatory Visit: Attending: Student | Admitting: Student

## 2023-12-25 VITALS — BP 108/60 | HR 82 | Ht 67.0 in | Wt 180.0 lb

## 2023-12-25 DIAGNOSIS — G4733 Obstructive sleep apnea (adult) (pediatric): Secondary | ICD-10-CM | POA: Diagnosis not present

## 2023-12-25 DIAGNOSIS — I10 Essential (primary) hypertension: Secondary | ICD-10-CM

## 2023-12-25 DIAGNOSIS — I420 Dilated cardiomyopathy: Secondary | ICD-10-CM

## 2023-12-25 DIAGNOSIS — I5020 Unspecified systolic (congestive) heart failure: Secondary | ICD-10-CM | POA: Diagnosis not present

## 2023-12-25 NOTE — Patient Instructions (Signed)
 Medication Instructions:  Your physician recommends that you continue on your current medications as directed. Please refer to the Current Medication list given to you today.  *If you need a refill on your cardiac medications before your next appointment, please call your pharmacy*  Lab Work: None ordered If you have labs (blood work) drawn today and your tests are completely normal, you will receive your results only by: MyChart Message (if you have MyChart) OR A paper copy in the mail If you have any lab test that is abnormal or we need to change your treatment, we will call you to review the results.  Testing/Procedures: None ordered  Follow-Up: At [facility], you and your health needs are our priority.  As part of our continuing mission to provide you with exceptional heart care, our providers are all part of one team.  This team includes your primary Cardiologist (physician) and Advanced Practice Providers or APPs (Physician Assistants and Nurse Practitioners) who all work together to provide you with the care you need, when you need it.  Your next appointment:   6 month(s)  Provider:   Ozell Jodie Passey, PA-C    We recommend signing up for the patient portal called MyChart.  Sign up information is provided on this After Visit Summary.  MyChart is used to connect with patients for Virtual Visits (Telemedicine).  Patients are able to view lab/test results, encounter notes, upcoming appointments, etc.  Non-urgent messages can be sent to your provider as well.   To learn more about what you can do with MyChart, go to [email or web address].

## 2023-12-25 NOTE — Progress Notes (Signed)
  Cardiology Office Note:   Date:  12/25/2023  ID:  ADRYANA MOGENSEN, DOB October 01, 1945, MRN [id number]  Primary Cardiologist: Soyla DELENA Merck, MD Electrophysiologist: Elspeth Sage, MD   History of Present Illness:   Susan Hunt is a 78 y.o. female with h/o DM, HTN, HLD, hypothyroid, mild OSA not on CPAP, ILD, and HFrEF s/p barostim 12/2021  seen today for routine electrophysiology followup.   Since last being seen in our clinic the patient reports doing OK. Using her O2 more often. Having globus sensation about 2-3 times a week. Not reproducible. She does have mild SOB with ambulation, but is overall stable. She rests around 11 am in the am and takes it easy for the rest of the day.   Review of systems complete and found to be negative unless listed in HPI.    EP Information / Studies Reviewed:    EKG is not ordered today. EKG from 11/09/2023 reviewed which showed NSR at 81 bpm   Arrhythmia/Device History Barostim (standard) implanted 12/30/2021 for Chronic systolic CHF    Barostim Interrogation- Performed personally and reviewed in detail today,  See scanned report   Physical Exam:   VS:  There were no vitals taken for this visit.   Wt Readings from Last 3 Encounters:  11/21/23 183 lb 6.4 oz (83.2 kg)  11/09/23 183 lb 12.8 oz (83.4 kg)  10/23/23 183 lb (83 kg)     GEN: Well nourished, well developed in no acute distress NECK: No JVD; No carotid bruits CARDIAC: Regular rate and rhythm, no murmurs, rubs, gallops RESPIRATORY:  Clear to auscultation without rales, wheezing or rhonchi  ABDOMEN: Soft, non-tender, non-distended EXTREMITIES:  No edema; No deformity   ASSESSMENT AND PLAN:    Chronic systolic CHF s/p Barostim implantation NYHA II-III symptoms.   Device programmed at 11.0 @ 65 ms for chronic settings  Device impedence stable. Normal device function See scanned report.  HTN Stable on current regimen   ILD Encouraged her to continue using O2 as directed.    Disposition:   Follow up with me in in 6 months  Signed, Ozell Prentice Passey, PA-C

## 2023-12-26 ENCOUNTER — Encounter: Payer: Self-pay | Admitting: Internal Medicine

## 2023-12-26 ENCOUNTER — Ambulatory Visit: Admitting: Internal Medicine

## 2023-12-26 VITALS — BP 124/64 | HR 77 | Temp 97.7°F | Ht 67.0 in | Wt 183.2 lb

## 2023-12-26 DIAGNOSIS — J849 Interstitial pulmonary disease, unspecified: Secondary | ICD-10-CM | POA: Diagnosis not present

## 2023-12-26 DIAGNOSIS — Z5181 Encounter for therapeutic drug level monitoring: Secondary | ICD-10-CM

## 2023-12-26 DIAGNOSIS — Z87891 Personal history of nicotine dependence: Secondary | ICD-10-CM

## 2023-12-26 LAB — HEPATIC FUNCTION PANEL
ALT: 13 U/L (ref 0–35)
AST: 20 U/L (ref 0–37)
Albumin: 4.5 g/dL (ref 3.5–5.2)
Alkaline Phosphatase: 84 U/L (ref 39–117)
Bilirubin, Direct: 0.1 mg/dL (ref 0.0–0.3)
Total Bilirubin: 0.5 mg/dL (ref 0.2–1.2)
Total Protein: 7.1 g/dL (ref 6.0–8.3)

## 2023-12-26 NOTE — Progress Notes (Signed)
 OV 08/19/2019  Subjective:  Patient ID: Susan Hunt, female , DOB: 1945/07/20 , age 78 y.o. , MRN: [id number] , ADDRESS: [address] [place] [address]   08/19/2019 -   Chief Complaint  Patient presents with   Consult    COPD, emphysema.  sob with exertion.  dry coughs alot at night     HPI Susan Hunt 78 y.o. -she is a retired Engineer, civil (consulting).  She used to work at the [employer] across [place] and having retired from that 10 years ago and then she worked for another agency apparently having retired from all duties few years ago.  Husband is a retired Teacher, early years/pre.  They are here for shortness of breath.  She tells me that she has had insidious onset of shortness of breath and cough that started 1 year ago and has been progressive.  In the same time she is also been on nitrofurantoin  for times.  First 3 of these were 1 week each.  The last 1 being for at least 6 weeks ending approximately 4-6 weeks ago.  Because of progressive symptoms she was given Stiolto but this did not help.  She has been on longstanding Singulair  for several years independent of these problems.  This because of allergies.  Therefore she has been referred here.  She did have a chest x-ray that on my personal visualization shows ILD changes.  It is documented below.  She did have a CT abdomen lung images that was reported as normal but I am not so sure from a year ago.  She is a previous smoker.  She is also self-referred herself to [facility] on the basis that she think she might have advanced COPD and is looking for a Zephyr valve.  Symptoms started in August 2020.   [facility] Comprehensive ILD Questionnaire  Symptoms:  She does have a cough.  She says the cough started in August 2020 the same for shortness of breath started.  She does cough at night.  She does cough when she lies down and it gets worse.  She does feel a tickle in her throat.  Cough does not affect her voice  she does not bring any phlegm.  There is no wheezing.  There is no nausea no vomiting no diarrhea.    Past Medical History :   She does have a histo abdomen ry of asthma for the last few years.  Also COPD not otherwise specified for the last few years.  His acid reflux disease for the last few years.  Diabetes for the last few weeks and thyroid  disease for the last few years.  Otherwise denies any collagen vascular disease or vasculitis.  Denies any sleep apnea.  Denies pulmonary hypertension.  Denies stroke denies pneumonias recurrent pneumonias.  Denies heart disease or pleurisy.   ROS: Positive for fatigue for the last several months.  Arthralgia for the last several months.  Dry eyes for the last several months.  With intermittent nausea for the last few years.  T there is daytime sleepiness for the last several months.     FAMILY HISTORY of LUNG DISEASE:  -Denies any pulmonary fibrosis.  Denies COPD denies asthma in the family.  Denies sarcoid denies cystic fibrosis denies hypersensitive pneumonitis.  Denies any autoimmune disease.   EXPOSURE HISTORY:   -She denies any Covid history.  Denies any exposure to Covid.  She did do Covid vaccine.  No reactions  after the Covid vaccine.  Did smoke cigarettes 15-year 2000 and 2015 10 to 12 cigarettes a day.  Did not do any passive smoking.  Never did any marijuana.  Elevated electronic cigarettes.  Never use cocaine never used intravenous drug use.   HOME and HOBBY DETAILS :  -[place]  Is lived there for 21 years.  Age of the home is 34 years.  In 2004 there was mold/mildew in the shower curtain.  And then there was mold under the shower.  This was torn down.  This was in 2004 but currently none.  She did some mulch back at work 2 months ago.  No humidifier use no CPAP use.  No nebulizer use.  No steam iron use.  No Jacuzzi use.  No misting Fountain in the house.  No pet birds or parakeets no gerbils.  No feather pillows.   No mold in the [facility] duct.  No music habits.   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  No otehrr organic antigen exposure that she recollects (MR side note: a different patient in 2016 reported that in 2013-2016 time lot of renovations there and mold +).  Patient reported on September 10, 2019: That between 1966 and 1980 she worked at [employer] and [place] .  At that time there is lot of mold exposure in the building.  The building was cleaned out.  Also checked for inorganic antigen exposure and this is negative.  Never worked in a dusty environment.  Never exposed to fumes.   PULMONARY TOXICITY HISTORY (27 items): Use nitrofurantoin  in 2019 and 2020.  This is described below.  Question if you can for this there is      Simple office walk 185 feet x  3 laps goal with forehead probe 08/19/2019   O2 used ra  Number laps completed Attempted 3 but desaturated at 2  Comments about pace Normal pace  Resting Pulse Ox/HR 100% and 101/min  Final Pulse Ox/HR 86% and 116/min  Desaturated </= 88% yes  Desaturated <= 3% points Yes , 14  Got Tachycardic >/= 90/min yes  Symptoms at end of test dyspneic  Miscellaneous comments Corrected with  2L [place]    Nuclear medicine cardiac stress test March 2016: Overall Impression:  Normal stress nuclear study with a small, mild, fixed apical defect consistent with thinning; no ischemia. LV Wall Motion:  NL LV Function; NL Wall Motion    CT abdomen lung cuts September 2020: Reported as normal.  I personally visualized and retrospect looks hazy and whether this is atelectasis or some other findings I do not know.  Jul 19, 2019:: Personally visualized shows findings of ILD.  I am not able to see a prior chest x-ray in the system or CT scan of the chest    Results for Susan, [facility] (MRN [id number]) as of 08/19/2019 15:18  Ref.  Range 03/26/2019 13:45  Creatinine Latest Ref Range: 0.40 - 1.20 mg/dL 8.94   ROS - per HPI Results for ELLOUISE, MCWHIRTER PAM (MRN [id number]) as of 08/19/2019 15:18  Ref. Range 03/26/2019 13:45  Hemoglobin Latest Ref Range: 12.0 - 15.0 g/dL 85.9     OV 3/70/7978  Subjective:  Patient ID: Susan PARAS CLORETTA, female , DOB: 20-Jan-1946 , age 59 y.o. , MRN: [id number] , ADDRESS: [address] [place] [address]  09/10/2019 -   Chief Complaint  Patient presents with   Follow-up    shortness of breath with exertion and non-productive cough     HPI Susan Hunt 78 y.o. -presents for follow-up to discuss results of dyspnea work-up.  High-resolution CT chest was personally visualized.  It shows presence of interstitial lung disease.  It needs Fleischner criteria for diagnosis not consistent with UIP.  Per ATS this is alternative diagnosis to UIP.  Radiologist raised the concern of hypersensitive pneumonitis but there is no air-trapping.  The other possibilities NSIP.  She is no longer on nitrofurantoin .  In any event unit she only took it intermittently.  Overall she is stable since last visit.  She had autoimmune profile.  Is only trace positive for ANA.  Her IgE is slightly elevated.  Her PFT shows mild restriction with reduction in diffusion capacity.  We went over her exposure history again.  This time she did recollect being exposed to mold for 15 to 20 years up until 1980 while living in [place]  and working at [employer].  She does not recollect mold at behavioral [facility] where she worked recently until she retired.  However another patient of mine did recollect mold in this building at [employer].   Low risk cardiac stress test in 2016 but with ejection fraction 54%.-Since then no overall change in health.  Husband inquired about the low ejection fraction.  In terms of symptoms: She is try to get portable oxygen  system.  She is switching company to [employer] because of  the shortage of portable oxygen  systems at [facility].     OV 11/12/2019   Subjective:  Patient ID: Susan Hunt, female , DOB: Nov 25, 1945, age 29 y.o. years. , MRN: [id number],  ADDRESS: [address] [place] [address] PCP  Jodie Lavern CROME, MD Providers : Treatment Team:  Attending Provider: Geronimo Amel, MD Thoracic surgeon: Dr. Kerrin Cardiologist Dr. Loni  Chief Complaint  Patient presents with   Follow-up    SOB    Follow-up interstitial lung disease not otherwise specified   HPI Susan Hunt 78 y.o. -returns for follow-up.  Her husband is with her.  After her last visit she saw cardiothoracic surgery but we had to hold the surgical lung biopsy because her echocardiogram from September 13, 2019 showed a drop in left ventricular systolic ejection fraction.  Since then she is seen cardiology.  She had a heart catheterization that shows ejection action 40% still low.  Nonobstructive coronary artery disease.  She is followed up with cardiology and she has been started on beta-blocker.  On her father medications was also adjusted.  So far no change in dyspnea.  She tells me overall dyspnea is the same without any change.  Symptom scores shows a two-point shift with worsening but she told the medical assistant that she is feeling worse.  Walking desaturation test today shows - normalcy .  She wants portable oxygen  but the first DME company [facility] did not have portable oxygen  tanks.  She is found another company [employer] that is able to give her portable oxygen .  She needs a requalifying walk that is documented below.  In terms of surgical lung biopsy: She did have some prominent mediastinal nodes.  It appears Dr. Kerrin has informed them that he will do a EBUS for this.  They have also been told by cardiology that from a cardiac standpoint patient would be a suitable candidate for surgical lung biopsy.  The husband wanted to know if a surgical biopsy is  indicated and also my perspective and safety.  xxxxxxxxxxxxxxxxxxxxxxxxxx Results for CERRA, EISENHOWER PAM (MRN [id number]) as of 09/10/2019 13:56  Ref. Range 08/23/2019 10:53  FVC-Pre Latest Units: L 2.61  FVC-%Pred-Pre Latest Units: % 80  FEV1-Pre Latest Units: L 2.26  FEV1-%Pred-Pre Latest Units: % 92  Results for ASHLAY, ALTIERI PAM (MRN [id number]) as of 09/10/2019 13:56  Ref. Range 08/23/2019 10:53  DLCO cor Latest Units: ml/min/mmHg 13.76  DLCO cor % pred Latest Units: % 64   Results for BUNA, CUPPETT PAM (MRN [id number]) as of 09/10/2019 13:56  Ref. Range 08/23/2019 10:53  TLC Latest Units: L 4.15  TLC % pred Latest Units: % 75   xxxxxxxxxxxxxxxxxxxxxx  CT chest high resolution June 2021 Lungs/Pleura: Mild pulmonary fibrosis in a pattern with apical predominance, featuring irregular peripheral interstitial opacity and scattered areas of subpleural bronchiolectasis. Mild, tubular bronchiectasis throughout. No significant air trapping on expiratory phase imaging. No pleural effusion or pneumothorax.   Upper Abdomen: No acute abnormality.   Musculoskeletal: No chest wall mass or suspicious bone lesions identified.   IMPRESSION: 1. Mild pulmonary fibrosis in a pattern with apical predominance, featuring irregular peripheral interstitial opacity and scattered areas of subpleural bronchiolectasis. Mild, tubular bronchiectasis throughout. Findings are most consistent with an alternative diagnosis pattern by ATS pulmonary fibrosis criteria, leading differential considerations chronic fibrotic hypersensitivity pneumonitis or NSIP. 2. Prominent mediastinal lymph nodes, nonspecific and likely reactive. 3. Coronary artery disease.  Aortic Atherosclerosis (ICD10-I70.0).     Electronically Signed   By: Marolyn Jaksch M.D.   On: 09/04/2019 15:37    xxxxxxxxxxxxxxxxxxxxxxxx[id number]  Results for GEORJEAN, TOYA [facility] (MRN [id number]) as of  09/10/2019 13:56  Ref. Range 08/19/2019 16:30 08/19/2019 16:30  SEE BELOW Unknown  Comment  Anti Nuclear Antibody (ANA) Latest Ref Range: NEGATIVE  POSITIVE (A)   ANA Pattern 1 Unknown Cytoplasmic (A)   ANA Titer 1 Unknown 1:80 (H) Negative  Angiotensin-Converting Enzyme Latest Ref Range: 9 - 67 U/L 35   Anti JO-1 Latest Ref Range: 0.0 - 0.9 AI  <0.2  CENTROMERE AB SCREEN Latest Ref Range: 0.0 - 0.9 AI  <0.2  Cyclic Citrullin Peptide Ab Latest Units: UNITS <16   dsDNA Ab Latest Ref Range: 0 - 9 IU/mL  5  ENA RNP Ab Latest Ref Range: 0.0 - 0.9 AI  0.6  ENA SSA (RO) Ab Latest Ref Range: 0.0 - 0.9 AI  <0.2  ENA SSB (LA) Ab Latest Ref Range: 0.0 - 0.9 AI  <0.2  Myeloperoxidase Abs Latest Units: AI <1.0   Serine Protease 3 Latest Units: AI <1.0   RA Latex Turbid. Latest Ref Range: <14 IU/mL <14   ENA SM Ab Ser-aCnc Latest Ref Range: 0.0 - 0.9 AI  <0.2  Chromatin Ab SerPl-aCnc Latest Ref Range: 0.0 - 0.9 AI  <0.2     has a past medical history of Asthma, Depression, Emphysema of lung (HCC), Hemorrhoids, Hyperlipidemia, Hypertension, Hypothyroidism, Obesity, Rosacea, and Tubular adenoma of colon (03/26/2019).  IMPRESSIONS   ECHO 09/13/19  1. Left ventricular ejection fraction, by estimation, is 30 to 35%. The  left ventricle has moderately decreased function. The left ventricle  demonstrates global hypokinesis. Apex poorly visualized, consider repeat  limited echo with contrast to rule out  apical thrombus. The left ventricular internal cavity size was moderately  dilated. Left ventricular diastolic parameters are consistent with Grade I  diastolic dysfunction (impaired relaxation).   2.  Right ventricular systolic function is normal. The right ventricular  size is normal. Tricuspid regurgitation signal is inadequate for assessing  PA pressure.   3. The mitral valve is normal in structure. Trivial mitral valve  regurgitation.   4. The aortic valve was not well visualized. Aortic valve  regurgitation  is not visualized. No aortic stenosis is present.  CATH 8?20/21 Prox LAD to Mid LAD lesion is 30% stenosed. Ost Cx to Prox Cx lesion is 35% stenosed. Prox RCA lesion is 35% stenosed. There is moderate left ventricular systolic dysfunction. LV end diastolic pressure is normal. The left ventricular ejection fraction is 35-45% by visual estimate.   1. Nonobstructive CAD 2. Moderate LV dysfunction. EF estimated at 40% with global hypokinesis 3. Normal LV filling pressures 4. Normal right heart pressures 5. Normal cardiac output.    Plan: medical therapy for LV dysfunction and lipid lowering therapy for nonobstructive CAD.    OV 12/27/2019  Subjective:  Patient ID: Susan Hunt, female , DOB: 11/10/1945 , age 69 y.o. , MRN: [id number] , ADDRESS: [address] [place] [address] PCP Jodie Lavern CROME, MD Patient Care Team: Jodie Lavern CROME, MD as PCP - General (Family Medicine) Emeline Selinda BIRCH, MD as Referring Physician (Internal Medicine) Katharina Pellet as Consulting Physician (Obstetrics and Gynecology) Gerldine Lauraine BROCKS, FNP as Consulting Physician (Urology) Geronimo Amel, MD as Consulting Physician (Pulmonary Disease)  This Provider for this visit: Treatment Team:  Attending Provider: Geronimo Amel, MD    12/27/2019 -   Chief Complaint  Patient presents with   Follow-up    discuss biopsy results. SOB sometimes. denies cough or wheezing.     HPI TALYN EDDIE 78 y.o. --follow-up interstitial lung disease.  Had surgical biopsy December 13, 2019.  After that she is was in the hospital requiring oxygen  but then got discharged without oxygen .  At this point time she is much improved but still having soreness from her chest incision site.  Husband and she are here.  They are concerned about some postoperative atelectasis that was seen.  I visualized the x-ray.  Today walking desaturation test is normal.  I reassured them about this.  But I  will give her incentive spirometer.  The main issue is to discuss the biopsy results: The bronchoalveolar lavage shows mixed cellularity with neutrophilia.  The surgical lung biopsy itself shows UIP with just 1 carcinoid tumor.  Reviewing her clinical history this UIP pattern is now consistent with IPF.  I gave her the diagnosis.  Regarding the carcinoid tumor let: I think this is an incidental finding.  There is no  DIPNECH syndrome  Her current symptom and walk profile are stable.   Results for MARGERET, STACHNIK [facility] (MRN [id number]) as of 12/27/2019 12:06  Ref. Range 12/13/2019 08:05  Color, Fluid Latest Ref Range: YELLOW  COLORLESS (A)  Total Nucleated Cell Count, Fluid Latest Ref Range: 0 - 1,000 cu mm 18  Lymphs, Fluid Latest Units: % 25  Eos, Fluid Latest Units: % 8  Appearance, Fluid Latest Ref Range: CLEAR  HAZY (A)  Other Cells, Fluid Latest Units: % MESOTHELIAL CELLS NOTED  Neutrophil Count, Fluid Latest Ref Range: 0 - 25 % 44 (H)  Monocyte-Macrophage-Serous Fluid Latest Ref Range: 50 - 90 % 23 (L)    OV 02/04/2020   Subjective:  Patient ID: Susan Hunt, female , DOB: Aug 01, 1945, age 65 y.o. years. , MRN: [id number],  ADDRESS: [address] [place] [address] PCP  Jodie,  Lavern CROME, MD Providers : Treatment Team:  Attending Provider: Geronimo Amel, MD Patient Care Team: Jodie Lavern CROME, MD as PCP - General (Family Medicine) Emeline Selinda BIRCH, MD as Referring Physician (Internal Medicine) Katharina Pellet as Consulting Physician (Obstetrics and Gynecology) Gerldine Lauraine BROCKS, FNP as Consulting Physician (Urology) Geronimo Amel, MD as Consulting Physician (Pulmonary Disease)    Chief Complaint  Patient presents with   Follow-up    ILD, doing well   Follow-up idiopathic pulmonary fibrosis diagnosed on surgical lung biopsy 12/13/2019.  Diagnosis formally given on 12/27/2019 Commenced pirfenidone  late October 2021/early November  2021     HPI JASNOOR TRUSSELL 78 y.o. -returns for follow-up with her husband.  This visit is mainly to focus on pirfenidone  uptake.  She has been taking pirfenidone  for 3 weeks or more.  She is currently on full dose 3 pills 3 times a day.  The husband wants to know when we would roll to a full largest x1 pill 3 times daily.  I indicated to them that it would be 3 to 4 months of stability with pirfenidone  before we made the decision.  Currently she is tolerating pirfenidone  well.  She did have some mild nausea and she thinks it is because she did not take a full meal.  Overall she is handling pirfenidone  well without any problems.  No fatigue no weight loss.  Respiratory wise she is stable.  She did get in touch with a local support group.  She is asking about pulmonary rehabilitation referral and she is interested in this.  One of the main issues they wanted to discuss was the fact the pirfenidone  refill in the supply chain was not consistent.  There was anxiety that they were not going to get the refill.  She had to call the office.  She believes that it is because she made the call to the office that the refill was sent.  She says she tried to call [employer] without any response for a long time.  I have indicated to her that I will ask our pharmacist to review and explained the logistics.  She will have a repeat LFT today      OV 04/02/2020  Subjective:  Patient ID: Susan Hunt, female , DOB: 1945-11-18 , age 57 y.o. , MRN: [id number] , ADDRESS: [address] [place] [address] PCP Jodie Lavern CROME, MD Patient Care Team: Jodie Lavern CROME, MD as PCP - General (Family Medicine) Emeline Selinda BIRCH, MD as Referring Physician (Internal Medicine) Katharina Pellet as Consulting Physician (Obstetrics and Gynecology) Gerldine Lauraine BROCKS, FNP as Consulting Physician (Urology) Geronimo Amel, MD as Consulting Physician (Pulmonary Disease)  This Provider for this visit: Treatment Team:   Attending Provider: Geronimo Amel, MD    04/02/2020 -   Chief Complaint  Patient presents with   Follow-up    Pt states she has been doing well since last visit and denies any complaints.   Follow-up idiopathic pulmonary fibrosis diagnosed on surgical lung biopsy 12/13/2019.  Diagnosis formally given on 12/27/2019 Commenced pirfenidone  late October 2021/early November 2021  HPI MORELIA CASSELLS 78 y.o. -returns for follow-up.  Presents with her husband.  She is tolerating pirfenidone  fine with just very mild occasional nausea.  She wants to roll over to the 1 pill 3 times daily.  I have sent a message through secure chat to the pharmacist.  She is going to start pulmonary rehabilitation in February 2022.  We discussed lung transplantation but at this  point we will hold off.  She will check liver function test today.    There are no new issues.  She is already touch base with the support group her symptom score shows stability.  Of note she has returned to portable oxygen  because she feels she does not need it.  Walking desaturation test confirms that.    She is interested in clinical trials.  She is touch base with cardiology and looking at a heart failure trial there.  She is also given her name for pulmonary fibrosis trials but currently there is a backlog.  I then emailed Delon Fetters cardiology Teacher, English as a foreign language and she replied saying, She was in our [facility] trial which her enrollment is complete. I am not aware of any other trial we are screening her for currently but will ask the team. I think IPF trial option would be in her best interest. I also think IPF will exclude her from our HF device trials.     PF   OV 07/23/2020  Subjective:  Patient ID: Susan Hunt, female , DOB: 10/03/1945 , age 40 y.o. , MRN: [id number] , ADDRESS: [address] [place] [address] PCP Jodie Lavern CROME, MD Patient Care Team: Jodie Lavern CROME, MD as PCP - General (Family  Medicine) Loni Soyla LABOR, MD as PCP - Cardiology (Cardiology) Emeline Selinda BIRCH, MD as Referring Physician (Internal Medicine) Katharina Pellet as Consulting Physician (Obstetrics and Gynecology) Gerldine Lauraine BROCKS, FNP as Consulting Physician (Urology) Geronimo Amel, MD as Consulting Physician (Pulmonary Disease)  This Provider for this visit: Treatment Team:  Attending Provider: Geronimo Amel, MD    07/23/2020 -   Chief Complaint  Patient presents with   Follow-up    IPF/ILD, denies changes   Follow-up idiopathic pulmonary fibrosis diagnosed on surgical lung biopsy 12/13/2019.  Diagnosis formally given on 12/27/2019 Commenced pirfenidone  late October 2021/early November 2021. Screen failed for [facility] Part D trial due to soc coreg  intake - Spring 2022. IPF final dx on MDD Jul 21, 2020  HPI Susan Hunt 78 y.o. -returns for follow-up.  At this point in time she is tolerating pirfenidone  well.  In fact no side effects.  Her symptom score is stable.  She had high-resolution CT chest as part of a research protocol and there is no progressions in September 2021.  Her walking desaturation test shows a tendency to drop.  Is unclear if it is worse or the same because she walks faster than usual.  She feels stable.  However April 2022 she had recent PFTs FVC, 2.122, 71.6%, DLCO: 11.4, 54% -> this would suggest a decline.  Of note she try to qualify for the phase 2 study CALLED  [facility]..  However she is on carvedilol  for hypertension control.  Hypertensive related to the reason of her cardiomyopathy.  Therefore she is screen failed.  She has good control of blood pressure with carvedilol .  She is interested in other research trials that she might qualify for.  Her husband Federica Allport is with her today.        OV 01/04/2021  Subjective:  Patient ID: Susan Hunt, female , DOB: 08-06-1945 , age 76 y.o. , MRN: [id number] , ADDRESS: [address] [place]  [address] PCP Jodie Lavern CROME, MD Patient Care Team: Jodie Lavern CROME, MD as PCP - General (Family Medicine) Loni Soyla LABOR, MD as PCP - Cardiology (Cardiology) Emeline Selinda BIRCH, MD as Referring Physician (Internal Medicine) Katharina Pellet as Consulting Physician (Obstetrics  and Gynecology) Gerldine Lauraine BROCKS, FNP as Consulting Physician (Urology) Geronimo Amel, MD as Consulting Physician (Pulmonary Disease)  This Provider for this visit: Treatment Team:  Attending Provider: Geronimo Amel, MD    01/04/2021 -   Chief Complaint  Patient presents with   Follow-up    Pt states she had a spell 4 weeks ago where her BP dropped too low to where she passed out and fell into a dresser which caused her to break some ribs. Pt said since this happened, she has had a little increased SOB as she is not able to take a deep breath.   Follow-up idiopathic pulmonary fibrosis diagnosed on surgical lung biopsy 12/13/2019.  Diagnosis formally given on 12/27/2019 Commenced pirfenidone  late October 2021/early November 2021. Screen failed for Pliant Part D trial due to soc coreg  intake - Spring 2022.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 without complication (HCC), Emphysema of lung (HCC), GERD (gastroesophageal reflux disease), Hemorrhoids, History of kidney stones, Hyperlipidemia, Hypertension, Hypothyroidism, Obesity, Pulmonary fibrosis (HCC), Rosacea, Sleep apnea, and Tubular adenoma of colon (03/26/2019).   reports that she quit smoking about 10 years ago. Her smoking use included cigarettes. She started smoking about 25 years ago. She has a 7.5 pack-year smoking history. She has never used smokeless tobacco.  Past Surgical History:  Procedure Laterality Date   ABDOMINAL HYSTERECTOMY     ANTERIOR AND POSTERIOR VAGINAL REPAIR  2020   CARDIAC CATHETERIZATION  11/01/2019   CATARACT EXTRACTION W/ INTRAOCULAR LENS IMPLANT Bilateral 2016   COLONOSCOPY     CYSTOSCOPY     DENTAL SURGERY  2016   Dental Implant   FRACTURE SURGERY Left 2013   Left ankle   INTERCOSTAL NERVE BLOCK Right 12/13/2019   Procedure: INTERCOSTAL NERVE BLOCK;  Surgeon: Kerrin Elspeth BROCKS, MD;  Location: [facility] OR;  Service: Thoracic;  Laterality: Right;   LUNG BIOPSY Right 12/13/2019   Procedure: LUNG BIOPSY;  Surgeon: Kerrin Elspeth BROCKS, MD;  Location: [facility] OR;  Service: Thoracic;  Laterality: Right;   LYMPH NODE BIOPSY Right 12/13/2019   Procedure: LYMPH NODE BIOPSY;  Surgeon: Kerrin Elspeth BROCKS, MD;  Location: [facility] OR;  Service: Thoracic;  Laterality: Right;   medtronix nerve stimulator  07/18/2019   Complete InterStim Sacral Neuromudulation system implantation 07/18/19, [facility]   NASAL SINUS SURGERY     RIGHT/LEFT HEART CATH AND CORONARY ANGIOGRAPHY N/A 11/01/2019   Procedure: RIGHT/LEFT HEART CATH AND CORONARY ANGIOGRAPHY;  Surgeon: [place],  Peter M, MD;  Location: [facility] INVASIVE CV LAB;  Service: Cardiovascular;  Laterality: N/A;   VAGINECTOMY, PARTIAL     VIDEO BRONCHOSCOPY N/A 12/13/2019   Procedure: VIDEO BRONCHOSCOPY;  Surgeon: Kerrin Elspeth BROCKS, MD;  Location: [facility] OR;  Service: Thoracic;  Laterality: N/A;    Allergies  Allergen Reactions   Ace Inhibitors Cough   Clarithromycin     REACTION: GI sx   Codeine Nausea Only   Sulfamethoxazole Nausea Only   Telmisartan Other (See Comments)    sleepy    Immunization History  Administered Date(s) Administered   Fluad Quad(high Dose 65+) 12/09/2019, 12/11/2020   Fluad Trivalent(High Dose 65+) 12/20/2022   Hepatitis A, Adult 04/06/2018   INFLUENZA, HIGH DOSE SEASONAL PF 12/17/2014, 12/20/2015, 01/15/2017, 12/18/2017, 11/28/2018, 11/21/2023   Influenza Split 01/06/2009   Influenza, Seasonal, Injecte, Preservative Fre 01/07/2014, 12/17/2014   Influenza,inj,Quad PF,6+ Mos 01/15/2017   Influenza,trivalent, recombinat, inj, PF 12/03/2012   Influenza-Unspecified 01/15/2017   Moderna Covid-19 Vaccine Bivalent Booster 72yrs & up 03/18/2021   Moderna SARS-COV2 Booster Vaccination 01/07/2020   Moderna Sars-Covid-2 Vaccination 04/17/2019, 05/15/2019, 08/20/2020   Pneumococcal Conjugate-13 01/07/2014   Pneumococcal Polysaccharide-23 11/23/2011   Td 03/14/2006, 10/16/2011   Zoster Recombinant(Shingrix) 10/21/2017, 12/18/2017   Zoster, Live 12/06/2013    Family History  Problem Relation Age of Onset   Heart disease Mother    Hypertension Mother    Osteoporosis Mother    Stroke Mother    Alcohol abuse Father    Diabetes Maternal Grandmother    Drug abuse Son    Breast cancer Neg Hx      Current Outpatient Medications:    carvedilol  (COREG ) 12.5 MG tablet, Take 1 tablet (12.5 mg total) by mouth 2 (two) times daily., Disp: 180 tablet, Rfl: 3   cetirizine (ZYRTEC) 10 MG tablet, Take 10 mg  by mouth daily., Disp: , Rfl:    FARXIGA  10 MG TABS tablet, Take 1 tablet (10 mg  total) by mouth daily., Disp: 90 tablet, Rfl: 3   levothyroxine  (SYNTHROID ) 112 MCG tablet, Take 112mcg Saturday and Sundays, Disp: 24 tablet, Rfl: 1   levothyroxine  (SYNTHROID ) 125 MCG tablet, Take 125mcg every weekday, Disp: 60 tablet, Rfl: 1   montelukast  (SINGULAIR ) 10 MG tablet, TAKE 1 TABLET BY MOUTH EVERY NIGHT AT BEDTIME, Disp: 90 tablet, Rfl: 1   Multiple Vitamin (MULTIVITAMIN WITH MINERALS) TABS tablet, Take 1 tablet by mouth daily., Disp: , Rfl:    nystatin  cream (MYCOSTATIN ), Apply 1 Application topically 2 (two) times daily., Disp: 30 g, Rfl: 2   omeprazole  (PRILOSEC) 40 MG capsule, Take 1 capsule (40 mg total) by mouth daily for 28 days., Disp: 28 capsule, Rfl: 0   ondansetron  (ZOFRAN -ODT) 4 MG disintegrating tablet, Take 1 tablet (4 mg total) by mouth every 8 (eight) hours as needed for nausea or vomiting., Disp: 20 tablet, Rfl: 1   Pirfenidone  (ESBRIET ) 801 MG TABS, Take 1 tablet (801 mg total) by mouth with breakfast, with lunch, and with evening meal. ** Keep appointment on 12/26/23 for future refills. **, Disp: 270 tablet, Rfl: 0   rosuvastatin  (CRESTOR ) 10 MG tablet, TAKE 1 TABLET BY MOUTH DAILY, Disp: 90 tablet, Rfl: 3   spironolactone  (ALDACTONE ) 25 MG tablet, Take 1 tablet (25 mg total) by mouth daily., Disp: , Rfl:    umeclidinium bromide  (INCRUSE ELLIPTA ) 62.5 MCG/ACT AEPB, Inhale 1 puff into the lungs daily., Disp: 30 each, Rfl: 1   valsartan  (DIOVAN ) 320 MG tablet, Take 0.5 tablets (160 mg total) by mouth daily., Disp: , Rfl:    venlafaxine  XR (EFFEXOR -XR) 150 MG 24 hr capsule, Take 1 capsule (150 mg total) by mouth daily., Disp: 90 capsule, Rfl: 3   ipratropium (ATROVENT ) 0.02 % nebulizer solution, Take 2.5 mLs (0.5 mg total) by nebulization 4 (four) times daily. (Patient not taking: Reported on 12/26/2023), Disp: 75 mL, Rfl: 12   ipratropium (ATROVENT ) 0.06 % nasal spray, Place 2 sprays into the nose 4 (four) times daily as needed for rhinitis. (Patient not taking:  Reported on 12/26/2023), Disp: 15 mL, Rfl: 11   triamcinolone  cream (KENALOG ) 0.1 %, Apply 1 Application topically 2 (two) times daily. (Patient not taking: Reported on 12/26/2023), Disp: 45 g, Rfl: 2      Objective:   Vitals:   12/26/23 1019  BP: 124/64  Pulse: 77  Temp: 97.7 F (36.5 C)  TempSrc: Oral  SpO2: 96%  Weight: 183 lb 3.2 oz (83.1 kg)  Height: 5' 7 (1.702 m)    Estimated body mass index is 28.69 kg/m as calculated from the following:   Height as of this encounter: 5' 7 (1.702 m).   Weight as of this encounter: 183 lb 3.2 oz (83.1 kg).  @WEIGHTCHANGE @  [employer]   12/26/23 1019  Weight: 183 lb 3.2 oz (83.1 kg)     Physical Exam   General: No distress. Looks well O2 at rest: no Cane present: no Sitting in wheel chair: no Frail: non Obese: no Neuro: Alert and Oriented x 3. GCS 15. Speech normal Psych: Pleasant Resp:  Barrel Chest - no.  Wheeze - no, Crackles - yes base, No overt respiratory distress CVS: Normal heart sounds. Murmurs - no Ext: Stigmata of Connective Tissue Disease - no HEENT: Normal upper airway. PEERL +. No post nasal drip        Assessment/  Assessment & Plan Interstitial lung disease (HCC)  Encounter for therapeutic drug monitoring    PLAN Patient Instructions     ICD-10-CM   1. Interstitial lung disease (HCC)  J84.9     2. Encounter for therapeutic drug monitoring  Z51.81         IPF stable based on symptoms, exercise test  Tolerating esbriet  well Last HRCT July 2024  Plan -Use oxygen  for any exertion greater than 200 yards that will drop your pulse ox less than 88%   - use your personal innogen -Continue oxygen  at night -Continue pirfenidone  per schedule -Check liver function test 12/26/2023 - do spiro and dlco in 3-4 months  Followup  -  15 minutes in 3-4 months; routine IPF followup  - can decide on followup CT chest at time of followup    FOLLOWUP    Return for  -  15 minutes in 3-4  months; routine IPF followup Najee Cowens.    SIGNATURE    Dr. Dorethia Cave, M.D., F.C.C.P,  Pulmonary and Critical Care Medicine Staff Physician, [facility] Director - Interstitial Lung Disease  Program  Pulmonary Fibrosis [facility] at [facility] [place], [place], [address]  Pager: [phone], If no answer or between  15:00h - 7:00h: call [phone] Telephone: [phone]  5:54 PM 12/26/2023

## 2023-12-26 NOTE — Patient Instructions (Addendum)
 ICD-10-CM   1. Interstitial lung disease (HCC)  J84.9     2. Encounter for therapeutic drug monitoring  Z51.81         IPF stable based on symptoms, exercise test  Tolerating esbriet  well Last HRCT July 2024  Plan -Use oxygen  for any exertion greater than 200 yards that will drop your pulse ox less than 88%   - use your personal innogen -Continue oxygen  at night -Continue pirfenidone  per schedule -Check liver function test 12/26/2023 - do spiro and dlco in 3-4 months  Followup  -  15 minutes in 3-4 months; routine IPF followup  - can decide on followup CT chest at time of followup

## 2023-12-28 ENCOUNTER — Other Ambulatory Visit: Payer: Self-pay

## 2023-12-28 NOTE — Progress Notes (Signed)
 Specialty Pharmacy Ongoing Clinical Assessment Note  Susan Hunt is a 78 y.o. female who is being followed by the specialty pharmacy service for RxSp Interstitial Lung Disease   Patient's specialty medication(s) reviewed today: Pirfenidone    Missed doses in the last 4 weeks: 0   Patient/Caregiver did not have any additional questions or concerns.   Therapeutic benefit summary: Patient is achieving benefit   Adverse events/side effects summary: No adverse events/side effects   Patient's therapy is appropriate to: Continue    Goals Addressed             This Visit's Progress    Maintain optimal adherence to therapy   On track    Patient is on track. Patient will maintain adherence and adhere to provider and/or lab appointments         Follow up: 12 months  [facility] Specialty Pharmacist

## 2023-12-29 ENCOUNTER — Encounter: Payer: Self-pay | Admitting: Internal Medicine

## 2023-12-29 ENCOUNTER — Encounter: Payer: Self-pay | Admitting: *Deleted

## 2023-12-29 DIAGNOSIS — Z006 Encounter for examination for normal comparison and control in clinical research program: Secondary | ICD-10-CM

## 2023-12-29 NOTE — Research (Signed)
 Exit/Termination 12-29-2023  Study Closure  Patient has completed all visits and study participation is now complete. Sent letter to patient .     [place] Mauri Tolen, Research Coordinator 12/29/2023

## 2024-01-01 ENCOUNTER — Other Ambulatory Visit (HOSPITAL_COMMUNITY)
Admission: RE | Admit: 2024-01-01 | Discharge: 2024-01-01 | Disposition: A | Discharge status: Home / Self Care | Source: Ambulatory Visit | Attending: Family Medicine | Admitting: Family Medicine

## 2024-01-01 ENCOUNTER — Other Ambulatory Visit: Payer: Self-pay

## 2024-01-01 ENCOUNTER — Other Ambulatory Visit (HOSPITAL_COMMUNITY): Payer: Self-pay

## 2024-01-01 ENCOUNTER — Encounter: Payer: Self-pay | Admitting: Family Medicine

## 2024-01-01 ENCOUNTER — Ambulatory Visit (INDEPENDENT_AMBULATORY_CARE_PROVIDER_SITE_OTHER): Admitting: Family Medicine

## 2024-01-01 VITALS — BP 136/80 | HR 71 | Temp 97.7°F | Ht 67.0 in | Wt 184.0 lb

## 2024-01-01 DIAGNOSIS — R197 Diarrhea, unspecified: Secondary | ICD-10-CM

## 2024-01-01 DIAGNOSIS — L292 Pruritus vulvae: Secondary | ICD-10-CM

## 2024-01-01 DIAGNOSIS — Z8619 Personal history of other infectious and parasitic diseases: Secondary | ICD-10-CM | POA: Diagnosis not present

## 2024-01-01 MED ORDER — SACCHAROMYCES BOULARDII 250 MG PO CAPS: 250.0000 mg | ORAL_CAPSULE | Freq: Two times a day (BID) | ORAL | 1 refills | Status: AC

## 2024-01-01 NOTE — Progress Notes (Signed)
 Subjective  CC:  Chief Complaint  Patient presents with   C. difficile colitis    Pt thinks she still has C diff bc she is still having diarrhea and a bad odor and a vulva itch that she can not get rid of     HPI: Susan Hunt is a 78 y.o. female who presents to the office today to address the problems listed above in the chief complaint.  Altered bowel habits and recurrent clostridioides difficile infection - Previous episode of C. difficile infection, treated in late august but appears to have recurred - Experiencing strong odor and bowel issues; frequent loose bowel mvts - Extensive use of Imodium    Also c/o vulvar itching and mild white d/c. Used otc antiyeast creams w/o resolution of sxs. Took one diflucan  yesterday No rash. No urinary sxs No f/cs  Assessment  1. History of Clostridium difficile colitis   2. Diarrhea of presumed infectious origin   3. Vulvar itching      Plan  Recurrent diarrhea w/ h/o C.diff:  worrisome for recurrent infection. Check stool studies. Start probiotic. Retreat if +. To GI if can't get resolved.  Check vaginal swab for bv/yeast.  Follow up: prn   Orders Placed This Encounter  Procedures   Clostridium difficile Toxin B, Qualitative, Real-Time PCR   Gastrointestinal Pathogen Pnl RT, PCR   Meds ordered this encounter  Medications   saccharomyces boulardii (FLORASTOR) 250 MG capsule    Sig: Take 1 capsule (250 mg total) by mouth 2 (two) times daily.    Dispense:  60 capsule    Refill:  1      I reviewed the patients updated PMH, FH, and SocHx.    Patient Active Problem List   Diagnosis Date Noted   ILD (interstitial lung disease) (HCC) 11/01/2019    Priority: High   HFrEF (heart failure with reduced ejection fraction) (HCC) 11/01/2019    Priority: High   Controlled type 2 diabetes mellitus without complication, without long-term current use of insulin  (HCC) 07/19/2019    Priority: High   Tubular adenoma of colon  03/26/2019    Priority: High   Panlobular emphysema (HCC) 06/17/2014    Priority: High   Major depression, recurrent, chronic 01/28/2014    Priority: High   Obesity (BMI 30.0-34.9) 07/15/2013    Priority: High   Acquired hypothyroidism 04/11/2007    Priority: High   Mixed hyperlipidemia 04/11/2007    Priority: High   Hypertension associated with diabetes (HCC) 04/11/2007    Priority: High   Recurrent UTI 04/12/2019    Priority: Medium    Hepatic steatosis 03/26/2019    Priority: Medium    Osteopenia of femoral neck 05/24/2016    Priority: Medium    RENAL CALCULUS, HX OF 04/11/2007    Priority: Medium    ACE-inhibitor cough 10/02/2017    Priority: Low   Postmenopausal symptoms 02/14/2017    Priority: Low   Grade II hemorrhoids 05/03/2016    Priority: Low   Mild obstructive sleep apnea 05/06/2014    Priority: Low   Allergic rhinitis 12/18/2009    Priority: Low   Rosacea 04/11/2007    Priority: Low   URINARY INCONTINENCE 04/11/2007    Priority: Low   Mallory-Weiss tear 05/14/2023   Intractable vomiting 05/12/2023   Research study patient 06/24/2020   Chronic vertigo 07/19/2019   Current Meds  Medication Sig   saccharomyces boulardii (FLORASTOR) 250 MG capsule Take 1 capsule (250 mg total) by mouth 2 (  two) times daily.    Allergies: Patient is allergic to ace inhibitors, clarithromycin, codeine, sulfamethoxazole, and telmisartan. Family History: Patient family history includes Alcohol abuse in her father; Diabetes in her maternal grandmother; Drug abuse in her son; Heart disease in her mother; Hypertension in her mother; Osteoporosis in her mother; Stroke in her mother. Social History:  Patient  reports that she quit smoking about 10 years ago. Her smoking use included cigarettes. She started smoking about 25 years ago. She has a 7.5 pack-year smoking history. She has never used smokeless tobacco. She reports that she does not currently use alcohol. She reports that she  does not use drugs.  Review of Systems: Constitutional: Negative for fever malaise or anorexia Cardiovascular: negative for chest pain Respiratory: negative for SOB or persistent cough Gastrointestinal: negative for abdominal pain  Objective  Vitals: BP 136/80   Pulse 71   Temp 97.7 F (36.5 C)   Ht 5' 7 (1.702 m)   Wt 184 lb (83.5 kg)   SpO2 94%   BMI 28.82 kg/m  General: no acute distress , A&Ox3 HEENT: PEERL, conjunctiva normal, neck is supple Abd soft  Commons side effects, risks, benefits, and alternatives for medications and treatment plan prescribed today were discussed, and the patient expressed understanding of the given instructions. Patient is instructed to call or message via MyChart if he/she has any questions or concerns regarding our treatment plan. No barriers to understanding were identified. We discussed Red Flag symptoms and signs in detail. Patient expressed understanding regarding what to do in case of urgent or emergency type symptoms.  Medication list was reconciled, printed and provided to the patient in AVS. Patient instructions and summary information was reviewed with the patient as documented in the AVS. This note was prepared with assistance of Dragon voice recognition software. Occasional wrong-word or sound-a-like substitutions may have occurred due to the inherent limitations of voice recognition software

## 2024-01-02 ENCOUNTER — Encounter: Payer: Self-pay | Admitting: Family Medicine

## 2024-01-03 ENCOUNTER — Other Ambulatory Visit: Payer: Self-pay | Admitting: Internal Medicine

## 2024-01-03 ENCOUNTER — Other Ambulatory Visit: Payer: Self-pay | Admitting: Family Medicine

## 2024-01-03 ENCOUNTER — Telehealth: Payer: Self-pay

## 2024-01-03 LAB — CERVICOVAGINAL ANCILLARY ONLY
Bacterial Vaginitis (gardnerella): NEGATIVE
Candida Glabrata: POSITIVE — AB
Candida Vaginitis: POSITIVE — AB
Comment: NEGATIVE
Comment: NEGATIVE
Comment: NEGATIVE

## 2024-01-03 LAB — GASTROINTESTINAL PATHOGEN PNL
CampyloBacter Group: NOT DETECTED
Norovirus GI/GII: NOT DETECTED
Rotavirus A: NOT DETECTED
Salmonella species: NOT DETECTED
Shiga Toxin 1: NOT DETECTED
Shiga Toxin 2: NOT DETECTED
Shigella Species: NOT DETECTED
Vibrio Group: NOT DETECTED
Yersinia enterocolitica: NOT DETECTED

## 2024-01-03 NOTE — Telephone Encounter (Signed)
 Copied from CRM #[id number]. Topic: General - Other >> Jan 01, 2024  5:48 PM [place] J wrote: Reason for CRM: The patient noticed that on the specimen cup she has, the wrong name and information is on it. She wants to be sure the other 2 specimens she already gave us  were correct or not.  Please call patient back with a solution.  I have spoken with Molly regarding the mix up and she assured me that she got things corrected

## 2024-01-03 NOTE — Telephone Encounter (Signed)
**Note De-identified  [place] Obfuscation** Please advise 

## 2024-01-04 ENCOUNTER — Other Ambulatory Visit

## 2024-01-04 DIAGNOSIS — R197 Diarrhea, unspecified: Secondary | ICD-10-CM | POA: Diagnosis not present

## 2024-01-04 DIAGNOSIS — Z8619 Personal history of other infectious and parasitic diseases: Secondary | ICD-10-CM | POA: Diagnosis not present

## 2024-01-05 ENCOUNTER — Encounter: Payer: Self-pay | Admitting: *Deleted

## 2024-01-05 ENCOUNTER — Ambulatory Visit: Payer: Self-pay | Admitting: Family Medicine

## 2024-01-05 LAB — CLOSTRIDIUM DIFFICILE TOXIN B, QUALITATIVE, REAL-TIME PCR: Toxigenic C. Difficile by PCR: NOT DETECTED

## 2024-01-05 MED ORDER — FLUCONAZOLE 150 MG PO TABS
ORAL_TABLET | ORAL | 0 refills | Status: AC
Start: 2024-01-05 — End: ?

## 2024-01-05 NOTE — Progress Notes (Signed)
 See mychart note Dear Ms. Petras, You have yeast infection; if symptoms persist, please repeat the diflucan . I have sent in a prescription. Initial GI testing is negative for infection. I am awaiting the C.diff test result. Sincerely, Dr. Jodie

## 2024-01-06 NOTE — Progress Notes (Signed)
 See mychart note Dear Ms. Susan Hunt, Your C. Diff test returned negative. We can test one more time if your symptoms persist to be sure since the test is not perfect; or we can refer you to GI for further evaluation. What is your preference?  Sincerely, Dr. Jodie

## 2024-01-08 ENCOUNTER — Ambulatory Visit: Admitting: Pharmacist Clinician (PhC)/ Clinical Pharmacy Specialist

## 2024-01-09 ENCOUNTER — Ambulatory Visit: Admitting: Pharmacist Clinician (PhC)/ Clinical Pharmacy Specialist

## 2024-01-09 ENCOUNTER — Other Ambulatory Visit (HOSPITAL_COMMUNITY): Payer: Self-pay

## 2024-01-09 ENCOUNTER — Ambulatory Visit: Attending: Internal Medicine | Admitting: Internal Medicine

## 2024-01-09 ENCOUNTER — Encounter: Payer: Self-pay | Admitting: Pharmacist Clinician (PhC)/ Clinical Pharmacy Specialist

## 2024-01-09 ENCOUNTER — Other Ambulatory Visit: Payer: Self-pay

## 2024-01-09 VITALS — BP 90/58 | HR 89 | Ht 67.0 in | Wt 182.0 lb

## 2024-01-09 VITALS — BP 96/66

## 2024-01-09 DIAGNOSIS — I152 Hypertension secondary to endocrine disorders: Secondary | ICD-10-CM

## 2024-01-09 DIAGNOSIS — E1159 Type 2 diabetes mellitus with other circulatory complications: Secondary | ICD-10-CM

## 2024-01-09 DIAGNOSIS — I5020 Unspecified systolic (congestive) heart failure: Secondary | ICD-10-CM

## 2024-01-09 DIAGNOSIS — I10 Essential (primary) hypertension: Secondary | ICD-10-CM | POA: Diagnosis not present

## 2024-01-09 MED ORDER — VALSARTAN 160 MG PO TABS: 160.0000 mg | ORAL_TABLET | Freq: Every day | ORAL | 3 refills | Status: AC

## 2024-01-09 MED ORDER — SPIRONOLACTONE 25 MG PO TABS: 12.5000 mg | ORAL_TABLET | Freq: Every day | ORAL | 3 refills | Status: DC

## 2024-01-09 MED ORDER — BLOOD PRESSURE MONITOR AUTOMAT DEVI
1.0000 | Freq: Every day | 0 refills | Status: AC
  Filled 2024-01-09: qty 1, 30d supply, fill #0

## 2024-01-09 NOTE — Assessment & Plan Note (Signed)
 Assessment: BP is low in office BP 96/66 mmHg;  Patient seeing few home low readings (< 100 systolic), usually mid-mornings) Ran out of valsartan  2-3 weeks ago, was unable to get refilled by PCP Tolerates valsartan , spironolactone  and carvedilol  well, without any side effects Denies SOB, palpitation, chest pain, headaches,or swelling Reiterated the importance of regular exercise and low salt diet   Plan:  Cut spironolactone  tablet in half, take 12.5 mg daily Resume valsartan  160 mg - will send in new rx, no longer need to cut 320 mg tabs in half Continue taking carvedilol  12.5 mg twice daily Patient to keep record of BP readings with heart rate and report to us  at the next visit Patient to follow up with me in 6 weeks  Labs ordered today:

## 2024-01-09 NOTE — Progress Notes (Signed)
 Office Visit    Patient Name: Susan Hunt Date of Encounter: 01/09/2024  Primary Care Provider:  Jodie Lavern CROME, MD Primary Cardiologist:  Soyla DELENA Merck, MD  Chief Complaint    Hypertension  Significant Past Medical History   HFrEF EF 35-40% - on GDMT  HTN On amlodipine , carvedilol , spironolactone , valsartan   DM2 9/25 A1c 6.6, on dapagliflozin   OSA Mild, no need for CPAP (2023)       Allergies  Allergen Reactions   Ace Inhibitors Cough   Clarithromycin     REACTION: GI sx   Codeine Nausea Only   Sulfamethoxazole Nausea Only   Telmisartan Other (See Comments)    sleepy    History of Present Illness    Susan Hunt is a 78 y.o. female patient of Dr Merck, in the office today for hypertension evaluation.  She is here today with her husband, a retired teacher, early years/pre.  Ms eddye broxterman the office earlier this month, concerned about fluctuating BP readings.  She was getting readings ranging from 92/40 to 154/84.  She noted that SOB was escalated with the low readings.  She was asked to take consistent readings twice daily for 2 weeks and bring that information, along with her device, to the office.  When I saw her for the first time in September she noted feeling dizziness/lightheadedness when the pressures drop to below 110 systolic.  Since July she had Covid and a UTI.  Ended up on 3 antibiotics and Paxlovid .  This combination led to an episode of C dif, and it took her about 2 months before she felt recovered.  When she saw Dr. Merck, BP was 124/54, so amlodipine  was stopped.  When I saw her pressure was lower (102/64) so carvedilol  was cut to 12.5 mg bid and spironolactone  moved to mornings for balance.  Most recently she saw Prentice Passey PA about 2 weeks ago with a pressure of 108/60,  No changes were made at that time.  Today she returns for follow up.  In addition so seeing me, she saw Dr. Merck this morning as well.  BP is down today and she notes  feeling some dizziness.  She apparently ran out of the valsartan  2-3 weeks ago.  Husband notes that it was originally filled by PCP, but because we are following, it didn't get refilled when sent from pharmacy to PCP.  So home readings for most of the past month are just on carvedilol  and spironolactone .  She notes that BP drops to < 100 systolic occasionally, usually after breakfast, around 10-11 am, but not daily.    Blood Pressure Goal:  140/80  Current Medications:  , carvedilol  12.5 mg bid  sprionolactone 25 mg qam     Previously tried:  ACEI - cough, telmisartan - drowsiness  Family Hx:   mother had stroke  Social Hx:      Tobacco: no  Alcohol: occasional wine  Caffeine:coffee in the am, otherwise decaf  Diet:    more eating out; home for breakfast - usually a bagel; otherwise not much meat - mostly chicken; more vegetables/salads; beans also for protein; drinking Gatorade (12 oz bottles) most days to combat dizziness/dehydration  Exercise: limited by ILD  Home BP readings: CVS wrist cuff, reads well systolic, but diastolic was over 20 points lower. Home readings vary from [phone]; tends to have the lower readings (< 100 systolic) in late mornings.  Adherence Assessment  Do you ever forget to take your medication? [] Yes [x] No  Do you ever skip doses due to side effects? [] Yes [x] No  Do you have trouble affording your medicines? [] Yes [x] No  Are you ever unable to pick up your medication due to transportation difficulties? [] Yes [x] No   Adherence strategy: 7 day minder  Accessory Clinical Findings    Lab Results  Component Value Date   CREATININE 1.08 11/21/2023   BUN 14 11/21/2023   NA 140 11/21/2023   K 4.2 11/21/2023   CL 103 11/21/2023   CO2 30 11/21/2023   Lab Results  Component Value Date   ALT 13 12/26/2023   AST 20 12/26/2023   ALKPHOS 84 12/26/2023   BILITOT 0.5 12/26/2023   Lab Results  Component Value Date   HGBA1C 6.6 (H) 11/21/2023     Home Medications    Current Outpatient Medications  Medication Sig Dispense Refill   Blood Pressure Monitoring (BLOOD PRESSURE MONITOR AUTOMAT) DEVI Use to monitor blood pressure daily or as directed 1 each 0   spironolactone  (ALDACTONE ) 25 MG tablet Take 0.5 tablets (12.5 mg total) by mouth daily. 45 tablet 3   valsartan  (DIOVAN ) 160 MG tablet Take 1 tablet (160 mg total) by mouth daily. 90 tablet 3   carvedilol  (COREG ) 12.5 MG tablet Take 1 tablet (12.5 mg total) by mouth 2 (two) times daily. 180 tablet 3   cetirizine (ZYRTEC) 10 MG tablet Take 10 mg by mouth daily.     FARXIGA  10 MG TABS tablet Take 1 tablet (10 mg total) by mouth daily. 90 tablet 3   fluconazole  (DIFLUCAN ) 150 MG tablet Take one tablet today; may repeat in 3 days if symptoms persist 2 tablet 0   ipratropium (ATROVENT ) 0.02 % nebulizer solution Take 2.5 mLs (0.5 mg total) by nebulization 4 (four) times daily. (Patient not taking: Reported on 01/09/2024) 75 mL 12   ipratropium (ATROVENT ) 0.06 % nasal spray Place 2 sprays into the nose 4 (four) times daily as needed for rhinitis. (Patient not taking: Reported on 01/09/2024) 15 mL 11   levothyroxine  (SYNTHROID ) 112 MCG tablet Take 112mcg Saturday and Sundays 24 tablet 1   levothyroxine  (SYNTHROID ) 125 MCG tablet Take 125mcg every weekday 60 tablet 1   montelukast  (SINGULAIR ) 10 MG tablet TAKE 1 TABLET BY MOUTH EVERY NIGHT AT BEDTIME 90 tablet 1   Multiple Vitamin (MULTIVITAMIN WITH MINERALS) TABS tablet Take 1 tablet by mouth daily.     nystatin  cream (MYCOSTATIN ) Apply 1 Application topically 2 (two) times daily. (Patient taking differently: Apply 1 Application topically daily as needed for dry skin.) 30 g 2   omeprazole  (PRILOSEC) 40 MG capsule Take 1 capsule (40 mg total) by mouth daily for 28 days. 28 capsule 0   ondansetron  (ZOFRAN -ODT) 4 MG disintegrating tablet Take 1 tablet (4 mg total) by mouth every 8 (eight) hours as needed for nausea or vomiting. 20 tablet 1    Pirfenidone  (ESBRIET ) 801 MG TABS Take 1 tablet (801 mg total) by mouth with breakfast, with lunch, and with evening meal. ** Keep appointment on 12/26/23 for future refills. ** 270 tablet 0   rosuvastatin  (CRESTOR ) 10 MG tablet TAKE 1 TABLET BY MOUTH DAILY 90 tablet 3   saccharomyces boulardii (FLORASTOR) 250 MG capsule Take 1 capsule (250 mg total) by mouth 2 (two) times daily. (Patient taking differently: Take 250 mg by mouth daily in the afternoon.) 60 capsule 1   triamcinolone  cream (KENALOG ) 0.1 % Apply 1 Application topically 2 (two) times daily. (Patient not taking: Reported on 01/09/2024) 45 g 2  umeclidinium bromide  (INCRUSE ELLIPTA ) 62.5 MCG/ACT AEPB Inhale 1 puff into the lungs daily. (Patient not taking: Reported on 01/09/2024) 30 each 1   venlafaxine  XR (EFFEXOR -XR) 150 MG 24 hr capsule TAKE 1 CAPSULE BY MOUTH DAILY 90 capsule 3   No current facility-administered medications for this visit.         Assessment & Plan    Hypertension associated with diabetes (HCC) Assessment: BP is low in office BP 96/66 mmHg;  Patient seeing few home low readings (< 100 systolic), usually mid-mornings) Ran out of valsartan  2-3 weeks ago, was unable to get refilled by PCP Tolerates valsartan , spironolactone  and carvedilol  well, without any side effects Denies SOB, palpitation, chest pain, headaches,or swelling Reiterated the importance of regular exercise and low salt diet   Plan:  Cut spironolactone  tablet in half, take 12.5 mg daily Resume valsartan  160 mg - will send in new rx, no longer need to cut 320 mg tabs in half Continue taking carvedilol  12.5 mg twice daily Patient to keep record of BP readings with heart rate and report to us  at the next visit Patient to follow up with me in 6 weeks  Labs ordered today:     Nikitia Asbill PharmD CPP [facility]  [address] [place], [place] [address] [phone]

## 2024-01-09 NOTE — Patient Instructions (Signed)
 Follow up appointment: December 18 at 10:15 am  Take your BP meds as follows:  Restart valsartan  160 mg daily in the evenings    Cut spironolactone  to 12.5 mg once daily in the mornings (tabs may crumble, just do your best).  Check your blood pressure at home twice daily, at least 3-4 days per week, and keep record of the readings.  Your blood pressure goal is < 130/80, but > 100/60  To check your pressure at home you will need to:  1. Sit up in a chair, with feet flat on the floor and back supported. Do not cross your ankles or legs. 2. Rest your left arm so that the cuff is about heart level. If the cuff goes on your upper arm,  then just relax the arm on the table, arm of the chair or your lap. If you have a wrist cuff, we  suggest relaxing your wrist against your chest (think of it as Pledging the Flag with the  wrong arm).  3. Place the cuff snugly around your arm, about 1 inch above the crook of your elbow. The  cords should be inside the groove of your elbow.  4. Sit quietly, with the cuff in place, for about 5 minutes. After that 5 minutes press the power  button to start a reading. 5. Do not talk or move while the reading is taking place.  6. Record your readings on a sheet of paper. Although most cuffs have a memory, it is often  easier to see a pattern developing when the numbers are all in front of you.  7. You can repeat the reading after 1-3 minutes if it is recommended  Make sure your bladder is empty and you have not had caffeine or tobacco within the last 30 min  Always bring your blood pressure log with you to your appointments. If you have not brought your monitor in to be double checked for accuracy, please bring it to your next appointment.  You can find a list of quality blood pressure cuffs at [email or web address]  Important lifestyle changes to control high blood pressure  Intervention  Effect on the BP  Lose extra pounds and watch your waistline Weight loss is  one of the most effective lifestyle changes for controlling blood pressure. If you're overweight or obese, losing even a small amount of weight can help reduce blood pressure. Blood pressure might go down by about 1 millimeter of mercury (mm Hg) with each kilogram (about 2.2 pounds) of weight lost.  Exercise regularly As a general goal, aim for at least 30 minutes of moderate physical activity every day. Regular physical activity can lower high blood pressure by about 5 to 8 mm Hg.  Eat a healthy diet Eating a diet rich in whole grains, fruits, vegetables, and low-fat dairy products and low in saturated fat and cholesterol. A healthy diet can lower high blood pressure by up to 11 mm Hg.  Reduce salt (sodium) in your diet Even a small reduction of sodium in the diet can improve heart health and reduce high blood pressure by about 5 to 6 mm Hg.  Limit alcohol One drink equals 12 ounces of beer, 5 ounces of wine, or 1.5 ounces of 80-proof liquor.  Limiting alcohol to less than one drink a day for women or two drinks a day for men can help lower blood pressure by about 4 mm Hg.   If you have any questions or concerns please use My  Chart to send questions or call the office at [phone]

## 2024-01-09 NOTE — Patient Instructions (Signed)
 Medication Instructions:  No medication changes were made at this visit. Continue current regimen.   *If you need a refill on your cardiac medications before your next appointment, please call your pharmacy*  Lab Work: None ordered today. If you have labs (blood work) drawn today and your tests are completely normal, you will receive your results only by: MyChart Message (if you have MyChart) OR A paper copy in the mail If you have any lab test that is abnormal or we need to change your treatment, we will call you to review the results.  Testing/Procedures: None ordered today.  Follow-Up: At [facility], you and your health needs are our priority.  As part of our continuing mission to provide you with exceptional heart care, our providers are all part of one team.  This team includes your primary Cardiologist (physician) and Advanced Practice Providers or APPs (Physician Assistants and Nurse Practitioners) who all work together to provide you with the care you need, when you need it.  Your next appointment:   6 month(s)  Provider:   Gayatri A Acharya, MD

## 2024-01-09 NOTE — Progress Notes (Signed)
  Cardiology Office Note:  .   Date:  01/09/2024  ID:  Susan Hunt, DOB 01/25/1946, MRN [id number] PCP: Jodie Lavern CROME, MD  [facility] Providers Cardiologist:  Soyla DELENA Merck, MD Electrophysiologist:  Elspeth Sage, MD (Inactive)    History of Present Illness: .   Susan Hunt is a 78 y.o. female.  Discussed the use of AI scribe software for clinical note transcription with the patient, who gave verbal consent to proceed.  History of Present Illness Susan Hunt is a 78 year old female with hypertension and chronic systolic heart failure NICM who presents for management of blood pressure and medication refills.  She experiences fluctuations in blood pressure, often with low readings, especially when using a wrist cuff. She lacks an arm cuff due to old cuff malfunction. Her current medications include carvedilol , Farxiga , Crestor , and spironolactone . She has not taken valsartan  for at least three weeks possibly due to refill issues. Her blood pressure readings at home are ocasionally low but often normal range on her home documentation. She is symptomatic today with hypotension.    ROS: negative except per HPI above.  Studies Reviewed: .        Results  Risk Assessment/Calculations:       Physical Exam:   VS:  BP (!) 90/58 (BP Location: Left Arm, Patient Position: Sitting, Cuff Size: Normal)   Pulse 89   Ht 5' 7 (1.702 m)   Wt 182 lb (82.6 kg)   BMI 28.51 kg/m    Wt Readings from Last 3 Encounters:  01/09/24 182 lb (82.6 kg)  01/01/24 184 lb (83.5 kg)  12/26/23 183 lb 3.2 oz (83.1 kg)     Physical Exam GENERAL: Alert, cooperative, well developed, no acute distress. HEENT: Normocephalic, normal oropharynx, moist mucous membranes. CHEST: Mild crackles in right lower lobe, otherwise clear to auscultation bilaterally. No wheezes or rhonchi. CARDIOVASCULAR: Normal heart rate and rhythm, S1 and S2 normal without murmurs. ABDOMEN: Soft,  non-tender, non-distended, without organomegaly. Normal bowel sounds. EXTREMITIES: No cyanosis or edema. NEUROLOGICAL: Cranial nerves grossly intact, moves all extremities without gross motor or sensory deficit.   ASSESSMENT AND PLAN: .    Assessment and Plan Assessment & Plan Heart failure with reduced ejection fraction Managed with carvedilol , spironolactone , and Farxiga . Barostim device improved fluid management. Improvement noted, valsartan  unavailable due to refill issues and concern about hypotension. - Continue carvedilol  12.5 mg twice a day. - Continue spironolactone  25 mg daily. - Refill Farxiga  through the [facility]. - Monitor fluid status and adjust medications as needed. - Discuss valsartan  refill with the pharmacist. PharmD and I have discussed, ok to use valsartan  over spironolactone  if needed.  Hypertension and hypotension management Blood pressure fluctuates with occasional hypotension. Valsartan  not taken for three weeks. Wrist cuff readings may be inaccurate; arm cuff recommended. Current regimen effective but low readings need addressing. Hydration and electrolyte intake advised. - Obtain an arm blood pressure cuff for more accurate readings. - Monitor blood pressure regularly. - Maintain hydration and consider electrolyte intake when hypotensive - Discuss blood pressure management with the pharmacist, visit later today.  Recording duration: 13 minutes      [name], MD, FACC

## 2024-01-10 MED ORDER — FARXIGA 10 MG PO TABS: 10.0000 mg | ORAL_TABLET | Freq: Every day | ORAL | 3 refills | Status: AC

## 2024-01-11 ENCOUNTER — Other Ambulatory Visit: Payer: Self-pay

## 2024-01-11 DIAGNOSIS — Z8619 Personal history of other infectious and parasitic diseases: Secondary | ICD-10-CM

## 2024-01-12 ENCOUNTER — Other Ambulatory Visit (HOSPITAL_COMMUNITY): Payer: Self-pay

## 2024-01-16 ENCOUNTER — Other Ambulatory Visit: Payer: Self-pay

## 2024-01-16 DIAGNOSIS — N390 Urinary tract infection, site not specified: Secondary | ICD-10-CM

## 2024-01-16 NOTE — Progress Notes (Signed)
 Specialty Pharmacy Refill Coordination Note  Susan Hunt is a 78 y.o. female contacted today regarding refills of specialty medication(s) Pirfenidone   Spoke with patient's husband  Patient requested Delivery   Delivery date: 01/30/24   Verified address: [address] [place]  [address]   Medication will be filled on: 01/29/24

## 2024-01-16 NOTE — Telephone Encounter (Signed)
 Pt stated that her symptoms has improved and she has been watching what she has been eating. Referral to Uro has been placed for recurrent UTI

## 2024-01-22 ENCOUNTER — Other Ambulatory Visit: Payer: Self-pay | Admitting: Family Medicine

## 2024-01-22 DIAGNOSIS — Z1231 Encounter for screening mammogram for malignant neoplasm of breast: Secondary | ICD-10-CM

## 2024-01-24 ENCOUNTER — Other Ambulatory Visit: Payer: Self-pay | Admitting: Family Medicine

## 2024-01-29 ENCOUNTER — Other Ambulatory Visit: Payer: Self-pay

## 2024-02-03 ENCOUNTER — Other Ambulatory Visit: Payer: Self-pay | Admitting: Internal Medicine

## 2024-02-13 ENCOUNTER — Other Ambulatory Visit: Payer: Self-pay | Admitting: Family Medicine

## 2024-02-18 ENCOUNTER — Encounter: Payer: Self-pay | Admitting: Family Medicine

## 2024-02-19 ENCOUNTER — Other Ambulatory Visit: Payer: Self-pay

## 2024-02-19 MED ORDER — LEVOTHYROXINE SODIUM 125 MCG PO TABS: ORAL_TABLET | ORAL | 1 refills | Status: AC

## 2024-02-19 MED ORDER — OMEPRAZOLE 40 MG PO CPDR: 40.0000 mg | DELAYED_RELEASE_CAPSULE | Freq: Every day | ORAL | 0 refills | Status: AC

## 2024-02-19 NOTE — Telephone Encounter (Signed)
 Rx sent.

## 2024-02-20 ENCOUNTER — Other Ambulatory Visit: Payer: Self-pay | Admitting: Internal Medicine

## 2024-02-20 ENCOUNTER — Other Ambulatory Visit: Payer: Self-pay

## 2024-02-20 ENCOUNTER — Other Ambulatory Visit (HOSPITAL_COMMUNITY): Payer: Self-pay

## 2024-02-20 ENCOUNTER — Telehealth: Payer: Self-pay

## 2024-02-20 DIAGNOSIS — J849 Interstitial pulmonary disease, unspecified: Secondary | ICD-10-CM

## 2024-02-20 NOTE — Telephone Encounter (Signed)
 Copied from CRM #[id number]. Topic: Clinical - Prescription Issue >> Feb 20, 2024  3:02 PM Mesmerise C wrote: Reason for CRM: Patient's husband Chyrl stated they received prescription for omeprazole  (PRILOSEC) 40 MG capsule but now the dosage is 40mg  instead of 20mg  advised while in ED was prescribed 40mg , stated patient only takes 20mg  and inquiring if a new prescription for the 20mg  can be sent over  [facility] to send the 20mg 

## 2024-02-21 ENCOUNTER — Other Ambulatory Visit: Payer: Self-pay

## 2024-02-21 ENCOUNTER — Other Ambulatory Visit (HOSPITAL_COMMUNITY): Payer: Self-pay

## 2024-02-21 MED ORDER — OMEPRAZOLE 20 MG PO CPDR: 20.0000 mg | DELAYED_RELEASE_CAPSULE | Freq: Every day | ORAL | 3 refills | Status: AC

## 2024-02-21 MED ORDER — PIRFENIDONE 801 MG PO TABS
801.0000 mg | ORAL_TABLET | Freq: Three times a day (TID) | ORAL | 1 refills | Status: DC
  Filled 2024-02-21 – 2024-02-27 (×2): qty 90, 30d supply, fill #0

## 2024-02-21 NOTE — Telephone Encounter (Signed)
 Refill sent for ESBRIET  to [facility]: [phone]   Dose: 801mg  by mouth three times daily   Last OV: 12/26/23 Provider: Dr. Geronimo Pertinent labs: 12/26/23 LFTs wnl   Next OV: due Jan, Feb 2026 - not yet scheduled  Routing to scheduling team for follow-up on appt scheduling  Aleck Puls, PharmD, BCPS Clinical Pharmacist  [facility]

## 2024-02-23 ENCOUNTER — Other Ambulatory Visit: Payer: Self-pay

## 2024-02-27 ENCOUNTER — Other Ambulatory Visit: Payer: Self-pay | Admitting: Pharmacy Technician

## 2024-02-27 ENCOUNTER — Other Ambulatory Visit: Payer: Self-pay

## 2024-02-27 NOTE — Progress Notes (Signed)
 Specialty Pharmacy Refill Coordination Note  Susan Hunt is a 78 y.o. female contacted today regarding refills of specialty medication(s) Pirfenidone   Spoke with Husband  Patient requested Delivery   Delivery date: 03/15/24   Verified address: [address]  [name]   Medication will be filled on: 03/13/24

## 2024-02-29 ENCOUNTER — Ambulatory Visit: Admitting: Pharmacist Clinician (PhC)/ Clinical Pharmacy Specialist

## 2024-03-13 ENCOUNTER — Other Ambulatory Visit: Payer: Self-pay

## 2024-03-13 ENCOUNTER — Telehealth: Payer: Self-pay | Admitting: Internal Medicine

## 2024-03-13 NOTE — Telephone Encounter (Signed)
 Patient stated she is having trouble swallowing due to her barostim and is also getting vibrations.  Patient wants a call back to discuss next steps.

## 2024-03-13 NOTE — Telephone Encounter (Signed)
 Returned patient's call, 2 identifiers used. Patient states Dr. Serene placed her barostim (implanted device to decrease heart failure symptoms) and Jodie Passey manages it for her. Patient last seen by Jodie in October. She states since then, she has had increasing discomfort with swallowing saliva, though she can eat normally. She also states she feels vibrations from the barostim when she turns her head to the left. She says in October this was happening 1-2 times a week, but now these symptoms are occurring several times a day. She is requesting appointment with Jodie Passey.   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 Patient currently denies throat pain, throat injury, difficulty eating or SOB and verbalizes understanding that Jodie is out until Friday due to the holiday. She states she is comfortable waiting to hear from [facility] on Friday.

## 2024-03-15 ENCOUNTER — Ambulatory Visit: Admitting: Student

## 2024-03-15 NOTE — Telephone Encounter (Signed)
 Spoke with Jodie Passey covering. Jodie willing to see patient today at 30. No answer on pt voicemail or spouse contact. Will try to reach out again

## 2024-03-17 ENCOUNTER — Encounter: Payer: Self-pay | Admitting: Internal Medicine

## 2024-03-18 ENCOUNTER — Ambulatory Visit
Admission: RE | Admit: 2024-03-18 | Discharge: 2024-03-18 | Disposition: A | Discharge status: Home / Self Care | Source: Ambulatory Visit | Attending: Family Medicine | Admitting: Family Medicine

## 2024-03-18 DIAGNOSIS — Z1231 Encounter for screening mammogram for malignant neoplasm of breast: Secondary | ICD-10-CM

## 2024-03-18 NOTE — Telephone Encounter (Signed)
 Spoke w/ patient  - she is scheduled for 1/8 with Jodie!

## 2024-03-19 ENCOUNTER — Other Ambulatory Visit: Payer: Self-pay

## 2024-03-19 ENCOUNTER — Telehealth: Payer: Self-pay

## 2024-03-19 DIAGNOSIS — J849 Interstitial pulmonary disease, unspecified: Secondary | ICD-10-CM

## 2024-03-19 MED ORDER — PIRFENIDONE 801 MG PO TABS
801.0000 mg | ORAL_TABLET | Freq: Three times a day (TID) | ORAL | 1 refills | Status: DC
  Filled 2024-03-19: qty 270, 90d supply, fill #0

## 2024-03-19 NOTE — Telephone Encounter (Signed)
 Previous health and safety inspector ran out.   Enrolled in pulmonary fibrosis grant through [employer].  ID [id number] GRP [id number] BIN [id number] PCN [id number]  Completed diagnosis verification form online through [facility] portal.

## 2024-03-19 NOTE — Telephone Encounter (Signed)
 Good morning, Please see patient message regarding assistance with Esbriet .  Thank you.

## 2024-03-19 NOTE — Addendum Note (Signed)
 Addended by: Glynna Failla L on: 03/19/2024 03:09 PM   Modules accepted: Orders

## 2024-03-21 ENCOUNTER — Encounter: Payer: Self-pay | Admitting: Student

## 2024-03-21 ENCOUNTER — Ambulatory Visit: Attending: Student | Admitting: Student

## 2024-03-21 VITALS — BP 100/60 | HR 77 | Ht 67.0 in | Wt 183.0 lb

## 2024-03-21 DIAGNOSIS — G4733 Obstructive sleep apnea (adult) (pediatric): Secondary | ICD-10-CM | POA: Diagnosis not present

## 2024-03-21 DIAGNOSIS — I5020 Unspecified systolic (congestive) heart failure: Secondary | ICD-10-CM | POA: Diagnosis not present

## 2024-03-21 DIAGNOSIS — I10 Essential (primary) hypertension: Secondary | ICD-10-CM

## 2024-03-21 NOTE — Progress Notes (Signed)
" °  Cardiology Office Note:   Date:  03/21/2024  ID:  KALA AMBRIZ, DOB Mar 11, 1946, MRN [id number]  Primary Cardiologist: Soyla DELENA Merck, MD Electrophysiologist: Soyla Gladis Norton, MD   History of Present Illness:   Susan Hunt is a 79 y.o. female with h/o DM, HTN, HLD, hypothyroid, mild OSA not on CPAP, ILD, and HFrEF s/p barostim 12/2021 seen today for acute visit due to discomfort associated with her Barostim device.    Patient reports worsening globus sensation, and sharp tingling/ache when turning head left.  Does report weight loss, but over a long period of time. Reproducible in clinic today. Otherwise,  she denies chest pain, palpitations, dyspnea, PND, orthopnea, nausea, vomiting, dizziness, syncope, edema, weight gain, or early satiety.  Takes a short rest early afternoon and is good to go for the rest of the day.   Review of systems complete and found to be negative unless listed in HPI.    EP Information / Studies Reviewed:    EKG is not ordered today. EKG from 10/2023 previously reviewed.   Arrhythmia/Device History Barostim (standard) implanted 12/30/2021 for Chronic systolic CHF    Barostim Interrogation- Performed personally and reviewed in detail today,  See scanned report  Physical Exam:   VS:  BP 100/60   Pulse 77   Ht 5' 7 (1.702 m)   Wt 183 lb (83 kg)   SpO2 95%   BMI 28.66 kg/m    Wt Readings from Last 3 Encounters:  03/21/24 183 lb (83 kg)  01/09/24 182 lb (82.6 kg)  01/01/24 184 lb (83.5 kg)     GEN: Well nourished, well developed in no acute distress NECK: No JVD; No carotid bruits CARDIAC: Regular rate and rhythm, no murmurs, rubs, gallops RESPIRATORY:  Clear to auscultation without rales, wheezing or rhonchi  ABDOMEN: Soft, non-tender, non-distended EXTREMITIES:  No edema; No deformity   ASSESSMENT AND PLAN:    Chronic systolic CHF s/p Barostim implantation NYHA II-III symptoms.   Device titrated from 11.0 millamp to 9.0 milliamp  with resolution of stim. No longer reproducible.  Device impedence stable. Normal device function See scanned report. Battery extended from 24 months to 33 months with change above.   HTN Stable on current regimen   ILD Encouraged her to use 02 as directed  Disposition:   Follow up with Me in May as scheduled.   Signed, Ozell Prentice Passey, PA-C  "

## 2024-03-21 NOTE — Patient Instructions (Addendum)
 Medication Instructions:  No medication changes today. *If you need a refill on your cardiac medications before your next appointment, please call your pharmacy*  Lab Work: No labwork ordered today. If you have labs (blood work) drawn today and your tests are completely normal, you will receive your results only by: MyChart Message (if you have MyChart) OR A paper copy in the mail If you have any lab test that is abnormal or we need to change your treatment, we will call you to review the results.  Testing/Procedures: No testing ordered today  Follow-Up: At [facility], you and your health needs are our priority.  As part of our continuing mission to provide you with exceptional heart care, our providers are all part of one team.  This team includes your primary Cardiologist (physician) and Advanced Practice Providers or APPs (Physician Assistants and Nurse Practitioners) who all work together to provide you with the care you need, when you need it.  Your next appointment:   In May as scheduled  Provider:   You may see Will Gladis Norton, MD or one of the following Advanced Practice Providers on your designated Care Team:   Charlies Arthur, PA-C Maygen Sirico Andy Miciah Shealy, PA-C Suzann Riddle, NP Daphne Barrack, NP    We recommend signing up for the patient portal called MyChart.  Sign up information is provided on this After Visit Summary.  MyChart is used to connect with patients for Virtual Visits (Telemedicine).  Patients are able to view lab/test results, encounter notes, upcoming appointments, etc.  Non-urgent messages can be sent to your provider as well.   To learn more about what you can do with MyChart, go to [email or web address].

## 2024-03-22 ENCOUNTER — Other Ambulatory Visit (HOSPITAL_COMMUNITY): Payer: Self-pay

## 2024-03-22 ENCOUNTER — Other Ambulatory Visit: Payer: Self-pay

## 2024-03-22 ENCOUNTER — Encounter: Payer: Self-pay | Admitting: Pharmacist Clinician (PhC)/ Clinical Pharmacy Specialist

## 2024-03-27 MED ORDER — PIRFENIDONE 801 MG PO TABS
801.0000 mg | ORAL_TABLET | Freq: Three times a day (TID) | ORAL | 1 refills | Status: AC
  Filled 2024-03-28: qty 270, 90d supply, fill #0
  Filled 2024-04-05 – 2024-04-09 (×2): qty 90, 30d supply, fill #0

## 2024-03-27 NOTE — Addendum Note (Signed)
 Addended by: Braven Wolk L on: 03/27/2024 04:40 PM   Modules accepted: Orders

## 2024-03-28 ENCOUNTER — Other Ambulatory Visit: Payer: Self-pay

## 2024-04-02 ENCOUNTER — Encounter: Payer: Self-pay | Admitting: Family Medicine

## 2024-04-03 ENCOUNTER — Other Ambulatory Visit: Payer: Self-pay | Admitting: Family Medicine

## 2024-04-05 ENCOUNTER — Other Ambulatory Visit: Payer: Self-pay | Admitting: Pharmacy Technician

## 2024-04-05 ENCOUNTER — Other Ambulatory Visit: Payer: Self-pay

## 2024-04-08 ENCOUNTER — Other Ambulatory Visit (HOSPITAL_COMMUNITY): Payer: Self-pay

## 2024-04-09 ENCOUNTER — Other Ambulatory Visit: Payer: Self-pay

## 2024-04-09 NOTE — Addendum Note (Signed)
 Addended by: SHAREN DELON HERO on: 04/09/2024 03:35 PM   Modules accepted: Orders

## 2024-04-11 ENCOUNTER — Other Ambulatory Visit: Payer: Self-pay

## 2024-04-11 NOTE — Progress Notes (Signed)
 Specialty Pharmacy Refill Coordination Note  Susan Hunt is a 79 y.o. female contacted today regarding refills of specialty medication(s) Pirfenidone    Patient requested Delivery   Delivery date: 04/12/24   Verified address: [address]  [place]   Medication will be filled on: 04/11/24

## 2024-04-15 ENCOUNTER — Encounter: Payer: Self-pay | Admitting: Pharmacist Clinician (PhC)/ Clinical Pharmacy Specialist

## 2024-04-15 ENCOUNTER — Ambulatory Visit: Admitting: Pharmacist Clinician (PhC)/ Clinical Pharmacy Specialist

## 2024-04-15 VITALS — BP 118/78 | HR 68

## 2024-04-15 DIAGNOSIS — I152 Hypertension secondary to endocrine disorders: Secondary | ICD-10-CM

## 2024-04-15 DIAGNOSIS — E1159 Type 2 diabetes mellitus with other circulatory complications: Secondary | ICD-10-CM

## 2024-04-15 MED ORDER — SPIRONOLACTONE 25 MG PO TABS: 12.5000 mg | ORAL_TABLET | ORAL | 3 refills | Status: AC

## 2024-04-15 NOTE — Patient Instructions (Signed)
" °  Take your BP meds as follows: DECREASE SPIRONOLACTONE  TO 12.5 MG EVERY OTHER DAY  CONTINUE WITH VALSARTAN  AND CARVEDILOL   Check your blood pressure at home 2-3 times per week and keep record of the readings.  Your blood pressure goal is < 130/80  To check your pressure at home you will need to:  1. Sit up in a chair, with feet flat on the floor and back supported. Do not cross your ankles or legs. 2. Rest your left arm so that the cuff is about heart level. If the cuff goes on your upper arm,  then just relax the arm on the table, arm of the chair or your lap. If you have a wrist cuff, we  suggest relaxing your wrist against your chest (think of it as Pledging the Flag with the  wrong arm).  3. Place the cuff snugly around your arm, about 1 inch above the crook of your elbow. The  cords should be inside the groove of your elbow.  4. Sit quietly, with the cuff in place, for about 5 minutes. After that 5 minutes press the power  button to start a reading. 5. Do not talk or move while the reading is taking place.  6. Record your readings on a sheet of paper. Although most cuffs have a memory, it is often  easier to see a pattern developing when the numbers are all in front of you.  7. You can repeat the reading after 1-3 minutes if it is recommended  Make sure your bladder is empty and you have not had caffeine or tobacco within the last 30 min  Always bring your blood pressure log with you to your appointments. If you have not brought your monitor in to be double checked for accuracy, please bring it to your next appointment.  You can find a list of quality blood pressure cuffs at [email or web address]  Important lifestyle changes to control high blood pressure  Intervention  Effect on the BP  Lose extra pounds and watch your waistline Weight loss is one of the most effective lifestyle changes for controlling blood pressure. If you're overweight or obese, losing even a small amount of  weight can help reduce blood pressure. Blood pressure might go down by about 1 millimeter of mercury (mm Hg) with each kilogram (about 2.2 pounds) of weight lost.  Exercise regularly As a general goal, aim for at least 30 minutes of moderate physical activity every day. Regular physical activity can lower high blood pressure by about 5 to 8 mm Hg.  Eat a healthy diet Eating a diet rich in whole grains, fruits, vegetables, and low-fat dairy products and low in saturated fat and cholesterol. A healthy diet can lower high blood pressure by up to 11 mm Hg.  Reduce salt (sodium) in your diet Even a small reduction of sodium in the diet can improve heart health and reduce high blood pressure by about 5 to 6 mm Hg.  Limit alcohol One drink equals 12 ounces of beer, 5 ounces of wine, or 1.5 ounces of 80-proof liquor.  Limiting alcohol to less than one drink a day for women or two drinks a day for men can help lower blood pressure by about 4 mm Hg.   If you have any questions or concerns please use My Chart to send questions or call the office at [phone]      "

## 2024-04-15 NOTE — Assessment & Plan Note (Signed)
 Assessment: BP is controlled in office BP 118/78 mmHg;  above the goal (<130/80). Home average 125/78, lowest reading 87/56 Tolerates valsartan , carvedilol  and spironolactone  well, without any side effects Denies SOB, palpitation, chest pain, headaches,or swelling Some dizziness with systolic readings < 100 Reiterated the importance of regular exercise and low salt diet   Plan:  Decrease spironolactone  to 12.5 mg every other day Continue taking valsartan  160 mg qhs, carvedilol  12.5 mg bid Patient to keep record of BP readings with heart rate Repeat BP after 5 minutes if evening reading elevated.   Patient to follow up with [facility] clinic Labs ordered today:  none

## 2024-06-07 ENCOUNTER — Ambulatory Visit: Admitting: Internal Medicine

## 2024-07-12 ENCOUNTER — Ambulatory Visit: Admitting: Student
